# Patient Record
Sex: Female | Born: 1955 | Race: White | Hispanic: No | Marital: Single | Smoking: Current every day smoker
Health system: Southern US, Community
[De-identification: ages and names within clinical notes are randomized; demographics above are authoritative.]

## PROBLEM LIST (undated history)

## (undated) DIAGNOSIS — R0600 Dyspnea, unspecified: Secondary | ICD-10-CM

## (undated) DIAGNOSIS — T8859XA Other complications of anesthesia, initial encounter: Secondary | ICD-10-CM

## (undated) DIAGNOSIS — T7840XA Allergy, unspecified, initial encounter: Secondary | ICD-10-CM

## (undated) DIAGNOSIS — J189 Pneumonia, unspecified organism: Secondary | ICD-10-CM

## (undated) DIAGNOSIS — M1990 Unspecified osteoarthritis, unspecified site: Secondary | ICD-10-CM

## (undated) DIAGNOSIS — M797 Fibromyalgia: Secondary | ICD-10-CM

## (undated) DIAGNOSIS — I10 Essential (primary) hypertension: Secondary | ICD-10-CM

## (undated) DIAGNOSIS — H269 Unspecified cataract: Secondary | ICD-10-CM

## (undated) DIAGNOSIS — J449 Chronic obstructive pulmonary disease, unspecified: Secondary | ICD-10-CM

## (undated) DIAGNOSIS — K219 Gastro-esophageal reflux disease without esophagitis: Secondary | ICD-10-CM

## (undated) DIAGNOSIS — R519 Headache, unspecified: Secondary | ICD-10-CM

## (undated) DIAGNOSIS — Z9989 Dependence on other enabling machines and devices: Secondary | ICD-10-CM

## (undated) DIAGNOSIS — E785 Hyperlipidemia, unspecified: Secondary | ICD-10-CM

## (undated) DIAGNOSIS — J45909 Unspecified asthma, uncomplicated: Secondary | ICD-10-CM

## (undated) DIAGNOSIS — J4489 Other specified chronic obstructive pulmonary disease: Secondary | ICD-10-CM

## (undated) HISTORY — PX: SPINE SURGERY: SHX786

## (undated) HISTORY — PX: APPENDECTOMY: SHX54

## (undated) HISTORY — PX: HERNIA REPAIR: SHX51

## (undated) HISTORY — PX: ABDOMINAL HYSTERECTOMY: SHX81

## (undated) HISTORY — DX: Allergy, unspecified, initial encounter: T78.40XA

## (undated) HISTORY — PX: CYST EXCISION: SHX5701

## (undated) HISTORY — PX: TONSILLECTOMY: SUR1361

---

## 2000-07-29 ENCOUNTER — Encounter: Payer: Self-pay | Admitting: Emergency Medicine

## 2000-07-29 ENCOUNTER — Emergency Department (HOSPITAL_COMMUNITY): Admission: EM | Admit: 2000-07-29 | Discharge: 2000-07-29 | Payer: Self-pay | Admitting: Emergency Medicine

## 2001-04-15 ENCOUNTER — Encounter: Payer: Self-pay | Admitting: Emergency Medicine

## 2001-04-15 ENCOUNTER — Emergency Department (HOSPITAL_COMMUNITY): Admission: EM | Admit: 2001-04-15 | Discharge: 2001-04-15 | Payer: Self-pay | Admitting: Emergency Medicine

## 2006-08-08 ENCOUNTER — Emergency Department (HOSPITAL_COMMUNITY): Admission: EM | Admit: 2006-08-08 | Discharge: 2006-08-08 | Payer: Self-pay | Admitting: Emergency Medicine

## 2008-08-04 ENCOUNTER — Emergency Department (HOSPITAL_COMMUNITY): Admission: EM | Admit: 2008-08-04 | Discharge: 2008-08-04 | Payer: Self-pay | Admitting: Family Medicine

## 2012-10-10 ENCOUNTER — Telehealth: Payer: Self-pay

## 2013-07-02 NOTE — Telephone Encounter (Signed)
error 

## 2014-02-11 ENCOUNTER — Emergency Department (HOSPITAL_COMMUNITY): Payer: No Typology Code available for payment source

## 2014-02-11 ENCOUNTER — Encounter (HOSPITAL_COMMUNITY): Payer: Self-pay | Admitting: Emergency Medicine

## 2014-02-11 ENCOUNTER — Emergency Department (HOSPITAL_COMMUNITY)
Admission: EM | Admit: 2014-02-11 | Discharge: 2014-02-11 | Disposition: A | Discharge status: Home / Self Care | Payer: No Typology Code available for payment source | Attending: Emergency Medicine | Admitting: Emergency Medicine

## 2014-02-11 DIAGNOSIS — Z79899 Other long term (current) drug therapy: Secondary | ICD-10-CM | POA: Insufficient documentation

## 2014-02-11 DIAGNOSIS — R0602 Shortness of breath: Secondary | ICD-10-CM | POA: Insufficient documentation

## 2014-02-11 DIAGNOSIS — J45901 Unspecified asthma with (acute) exacerbation: Secondary | ICD-10-CM | POA: Insufficient documentation

## 2014-02-11 DIAGNOSIS — F17200 Nicotine dependence, unspecified, uncomplicated: Secondary | ICD-10-CM | POA: Insufficient documentation

## 2014-02-11 DIAGNOSIS — IMO0002 Reserved for concepts with insufficient information to code with codable children: Secondary | ICD-10-CM | POA: Insufficient documentation

## 2014-02-11 HISTORY — DX: Unspecified asthma, uncomplicated: J45.909

## 2014-02-11 LAB — I-STAT TROPONIN, ED: Troponin i, poc: 0 ng/mL (ref 0.00–0.08)

## 2014-02-11 MED ORDER — ALBUTEROL SULFATE HFA 108 (90 BASE) MCG/ACT IN AERS: 2.0000 | INHALATION_SPRAY | RESPIRATORY_TRACT | Status: DC | PRN

## 2014-02-11 MED ORDER — PREDNISONE 20 MG PO TABS
ORAL_TABLET | ORAL | Status: DC
Start: 2014-02-11 — End: 2017-06-19

## 2014-02-11 NOTE — ED Provider Notes (Signed)
CSN: [id number]     Arrival date & time 02/11/14  1350 History   First MD Initiated Contact with Patient 02/11/14 1444     Chief Complaint  Patient presents with  . Shortness of Breath  . Hypertension     (Consider location/radiation/quality/duration/timing/severity/associated sxs/prior Treatment) HPI 58 year old female with history of tobacco abuse, asthma, suspected COPD, presents with about one week of a cough with productive sputum, no fever, no confusion, she has constant sharp stabbing positional nonexertional chest pain 24 hours a day for the last several days worse with cough outpatient and torso movement, she has intermittent mild shortness of breath but is not short of breath now and does not want a breathing treatment now, she ran out of her inhaler so could not use of this week, she is no abdominal pain no rash no vomiting no diarrhea no other concerns. There is no treatment prior to arrival. Past Medical History  Diagnosis Date  . Asthma    Past Surgical History  Procedure Laterality Date  . Abdominal hysterectomy    . Tonsillectomy    . Appendectomy     History reviewed. No pertinent family history. History  Substance Use Topics  . Smoking status: Current Every Day Smoker -- 1.50 packs/day for 40 years    Types: Cigarettes  . Smokeless tobacco: Not on file  . Alcohol Use: No   OB History   Grav Para Term Preterm Abortions TAB SAB Ect Mult Living                 Review of Systems 10 Systems reviewed and are negative for acute change except as noted in the HPI.   Allergies  Celebrex; Ciprofloxacin; Lyrica; and Codeine  Home Medications   Prior to Admission medications   Medication Sig Start Date End Date Taking? Authorizing Provider  Ephedrine-Guaifenesin (BRONKAID) 25-400 MG TABS Take 0.5 tablets by mouth daily as needed (congestion.).   Yes Historical Provider, MD  ibuprofen (ADVIL,MOTRIN) 200 MG tablet Take 600 mg by mouth every 6 (six) hours as needed  for mild pain.   Yes Historical Provider, MD  methocarbamol (ROBAXIN) 500 MG tablet Take 250-500 mg by mouth every 8 (eight) hours as needed for muscle spasms.   Yes Historical Provider, MD  Multiple Vitamin (MULTIVITAMIN WITH MINERALS) TABS tablet Take 1 tablet by mouth daily.   Yes Historical Provider, MD  albuterol (PROVENTIL HFA;VENTOLIN HFA) 108 (90 BASE) MCG/ACT inhaler Inhale 2 puffs into the lungs every 2 (two) hours as needed for wheezing or shortness of breath (cough). 02/11/14   Babette Relic, MD  predniSONE (DELTASONE) 20 MG tablet 3 tabs po day one, then 2 po daily x 4 days 02/11/14   Babette Relic, MD   BP 171/71  Pulse 89  Temp(Src) 98.4 F (36.9 C) (Oral)  Resp 18  SpO2 96% Physical Exam  Nursing note and vitals reviewed. Constitutional:  Awake, alert, nontoxic appearance.  HENT:  Head: Atraumatic.  Eyes: Right eye exhibits no discharge. Left eye exhibits no discharge.  Neck: Neck supple.  Cardiovascular: Normal rate and regular rhythm.   No murmur heard. Pulmonary/Chest: Effort normal. No respiratory distress. She has wheezes. She has no rales. She exhibits tenderness.  Reproducible diffuse chest wall tenderness without rash, diffuse expiratory wheezes with no retractions no accessory muscle usage and pulse oximetry is normal on room air 97% with the patient speaking full sentences without distress in the ED and she does not want breathing treatment in the  ED.  Abdominal: Soft. Bowel sounds are normal. She exhibits no distension. There is no tenderness. There is no rebound and no guarding.  Musculoskeletal: She exhibits no edema and no tenderness.  Baseline ROM, no obvious new focal weakness.  Neurological: She is alert.  Mental status and motor strength appears baseline for patient and situation.  Skin: No rash noted.  Psychiatric: She has a normal mood and affect.    ED Course  Procedures (including critical care time) Patient informed of clinical course,  understand medical decision-making process, and agree with plan. Pt does not want neb in ED. Labs Review Labs Reviewed  Randolm Idol, ED    Imaging Review Dg Chest 2 View  02/11/2014   CLINICAL DATA:  Shortness of breath  EXAM: CHEST  2 VIEW  COMPARISON:  08/08/2006  FINDINGS: Cardiac shadow is stable. The lungs are well aerated bilaterally without focal infiltrate or sizable effusion. Mild degenerative changes of the thoracic spine are seen. No acute bony abnormality is noted.  IMPRESSION: No acute abnormality noted.   Electronically Signed   By: Inez Catalina M.D.   On: 02/11/2014 15:37     EKG Interpretation   Date/Time:  Wednesday February 11 2014 13:57:11 EDT Ventricular Rate:  77 PR Interval:  138 QRS Duration: 96 QT Interval:  385 QTC Calculation: 436 R Axis:   61 Text Interpretation:  Sinus rhythm Low voltage, precordial leads Baseline  wander in lead(s) I II aVF V3 V5 V6 No previous ECGs available Confirmed  by [facility]  MD, Jenny Reichmann ([id number]) on 02/11/2014 2:01:23 PM      MDM   Final diagnoses:  Asthma attack    I doubt any other EMC precluding discharge at this time including, but not necessarily limited to the following:SBI.    Babette Relic, MD 02/11/14 2231

## 2014-02-11 NOTE — ED Notes (Signed)
Pt reports hx of 40 years smoking 1.5 packs/ day. Hx of asthma. Reports productive cough, green sputum x1 week. Rib/ chest pain starting today 6/10. Pt reports she was SOB and had high blood pressure and was brought by friend to ED. Pt 98% on room air and speaking in full sentences.

## 2014-02-11 NOTE — ED Notes (Signed)
Initial Contact - pt to RM5, placed to cardiac/02 monitor.  Reports cough x1 week, prod yellow/green sputum and feeling SOB since last night "i think it's bronchitis".  Pt denies CP.  Reports c/o 6/10 pain to ribs "from coughing so much".  Pt denies fevers/chills.  Skin PWD.  Speaking full/clear sentences, rr even/un-lab.  MAEI, self repositioning for comfort.  NAD.

## 2014-02-11 NOTE — ED Notes (Signed)
Pt to CXR.

## 2014-02-11 NOTE — Discharge Instructions (Signed)
Asthma, Acute Bronchospasm °Acute bronchospasm caused by asthma is also referred to as an asthma attack. Bronchospasm means your air passages become narrowed. The narrowing is caused by inflammation and tightening of the muscles in the air tubes (bronchi) in your lungs. This can make it hard to breathe or cause you to wheeze and cough. °CAUSES °Possible triggers are: °· Animal dander from the skin, hair, or feathers of animals. °· Dust mites contained in house dust. °· Cockroaches. °· Pollen from trees or grass. °· Mold. °· Cigarette or tobacco smoke. °· Air pollutants such as dust, household cleaners, hair sprays, aerosol sprays, paint fumes, strong chemicals, or strong odors. °· Cold air or weather changes. Cold air may trigger inflammation. Winds increase molds and pollens in the air. °· Strong emotions such as crying or laughing hard. °· Stress. °· Certain medicines such as aspirin or beta-blockers. °· Sulfites in foods and drinks, such as dried fruits and wine. °· Infections or inflammatory conditions, such as a flu, cold, or inflammation of the nasal membranes (rhinitis). °· Gastroesophageal reflux disease (GERD). GERD is a condition where stomach acid backs up into your esophagus. °· Exercise or strenuous activity. °SIGNS AND SYMPTOMS  °· Wheezing. °· Excessive coughing, particularly at night. °· Chest tightness. °· Shortness of breath. °DIAGNOSIS  °Your health care provider will ask you about your medical history and perform a physical exam. A chest X-ray or blood testing may be performed to look for other causes of your symptoms or other conditions that may have triggered your asthma attack.  °TREATMENT  °Treatment is aimed at reducing inflammation and opening up the airways in your lungs.  Most asthma attacks are treated with inhaled medicines. These include quick relief or rescue medicines (such as bronchodilators) and controller medicines (such as inhaled corticosteroids). These medicines are sometimes  given through an inhaler or a nebulizer. Systemic steroid medicine taken by mouth or given through an IV tube also can be used to reduce the inflammation when an attack is moderate or severe. Antibiotic medicines are only used if a bacterial infection is present.  °HOME CARE INSTRUCTIONS  °· Rest. °· Drink plenty of liquids. This helps the mucus to remain thin and be easily coughed up. Only use caffeine in moderation and do not use alcohol until you have recovered from your illness. °· Do not smoke. Avoid being exposed to secondhand smoke. °· You play a critical role in keeping yourself in good health. Avoid exposure to things that cause you to wheeze or to have breathing problems. °· Keep your medicines up-to-date and available. Carefully follow your health care provider's treatment plan. °· Take your medicine exactly as prescribed. °· When pollen or pollution is bad, keep windows closed and use an air conditioner or go to places with air conditioning. °· Asthma requires careful medical care. See your health care provider for a follow-up as advised. If you are more than [redacted] weeks pregnant and you were prescribed any new medicines, let your obstetrician know about the visit and how you are doing. Follow up with your health care provider as directed. °· After you have recovered from your asthma attack, make an appointment with your outpatient doctor to talk about ways to reduce the likelihood of future attacks. If you do not have a doctor who manages your asthma, make an appointment with a primary care doctor to discuss your asthma. °SEEK IMMEDIATE MEDICAL CARE IF:  °· You are getting worse. °· You have trouble breathing. If severe, call your local   emergency services (911 in the [place]  You develop chest pain or discomfort.  You are vomiting.  You are not able to keep fluids down.  You are coughing up yellow, green, brown, or bloody sputum.  You have a fever and your symptoms suddenly get worse.  You have  trouble swallowing. MAKE SURE YOU:   Understand these instructions.  Will watch your condition.  Will get help right away if you are not doing well or get worse. Document Released: 10/18/2006 Document Revised: 07/08/2013 Document Reviewed: 01/08/2013 [facility] Patient Information 2015 [place], [place]. This information is not intended to replace advice given to you by your health care provider. Make sure you discuss any questions you have with your health care provider.  You appear to have an upper respiratory infection (URI). An upper respiratory tract infection, or cold, is a viral infection of the air passages leading to the lungs. It is contagious and can be spread to others, especially during the first 3 or 4 days. It cannot be cured by antibiotics or other medicines. RETURN IMMEDIATELY IF you develop shortness of breath, confusion or altered mental status, a new rash, become dizzy, faint, or poorly responsive, or are unable to be cared for at home.  Please stop smoking to improve your health.

## 2014-02-11 NOTE — Progress Notes (Signed)
  CARE MANAGEMENT ED NOTE 02/11/2014  Patient:  Frances Nelson, Frances Nelson   Account Number:  [id number]  Date Initiated:  02/11/2014  Documentation initiated by:  Livia Snellen  Subjective/Objective Assessment:   Patient presents to Ed with shortness of breath, high blood pressure, productive cough for green sputum     Subjective/Objective Assessment Detail:     Action/Plan:   Action/Plan Detail:   Anticipated DC Date:       Status Recommendation to Physician:   Result of Recommendation:    Other ED [place]  Other  PCP issues    Choice offered to / List presented to:            Status of service:  Completed, signed off  ED Comments:   ED Comments Detail:  Patient confirms she does not have a pcp with [place] insurance.  EDCM instructed patient to call the phone number on the back of her insutrance card or go to [place] website to help her find a pcp who is close to her and within network.  Patient verbalized understanding.  No further EDCM needs at this time.

## 2014-07-17 DIAGNOSIS — J3089 Other allergic rhinitis: Secondary | ICD-10-CM | POA: Insufficient documentation

## 2015-09-06 DIAGNOSIS — J441 Chronic obstructive pulmonary disease with (acute) exacerbation: Secondary | ICD-10-CM | POA: Insufficient documentation

## 2015-10-28 DIAGNOSIS — M792 Neuralgia and neuritis, unspecified: Secondary | ICD-10-CM | POA: Diagnosis not present

## 2015-10-28 DIAGNOSIS — R768 Other specified abnormal immunological findings in serum: Secondary | ICD-10-CM | POA: Diagnosis not present

## 2015-10-28 DIAGNOSIS — M791 Myalgia: Secondary | ICD-10-CM | POA: Diagnosis not present

## 2015-10-28 DIAGNOSIS — T50905A Adverse effect of unspecified drugs, medicaments and biological substances, initial encounter: Secondary | ICD-10-CM | POA: Insufficient documentation

## 2015-10-28 DIAGNOSIS — J449 Chronic obstructive pulmonary disease, unspecified: Secondary | ICD-10-CM | POA: Diagnosis not present

## 2015-10-28 DIAGNOSIS — M62838 Other muscle spasm: Secondary | ICD-10-CM | POA: Insufficient documentation

## 2015-11-11 DIAGNOSIS — M79604 Pain in right leg: Secondary | ICD-10-CM | POA: Diagnosis not present

## 2015-11-11 DIAGNOSIS — M79601 Pain in right arm: Secondary | ICD-10-CM | POA: Insufficient documentation

## 2015-11-26 DIAGNOSIS — M792 Neuralgia and neuritis, unspecified: Secondary | ICD-10-CM | POA: Diagnosis not present

## 2015-11-26 DIAGNOSIS — M791 Myalgia: Secondary | ICD-10-CM | POA: Diagnosis not present

## 2015-11-26 DIAGNOSIS — N281 Cyst of kidney, acquired: Secondary | ICD-10-CM | POA: Diagnosis not present

## 2015-12-08 DIAGNOSIS — M792 Neuralgia and neuritis, unspecified: Secondary | ICD-10-CM | POA: Diagnosis not present

## 2015-12-08 DIAGNOSIS — M791 Myalgia: Secondary | ICD-10-CM | POA: Diagnosis not present

## 2015-12-08 DIAGNOSIS — G5601 Carpal tunnel syndrome, right upper limb: Secondary | ICD-10-CM | POA: Diagnosis not present

## 2015-12-16 DIAGNOSIS — J449 Chronic obstructive pulmonary disease, unspecified: Secondary | ICD-10-CM | POA: Diagnosis not present

## 2015-12-16 DIAGNOSIS — G5601 Carpal tunnel syndrome, right upper limb: Secondary | ICD-10-CM | POA: Insufficient documentation

## 2015-12-16 DIAGNOSIS — R768 Other specified abnormal immunological findings in serum: Secondary | ICD-10-CM | POA: Diagnosis not present

## 2015-12-24 DIAGNOSIS — M79671 Pain in right foot: Secondary | ICD-10-CM | POA: Diagnosis not present

## 2015-12-24 DIAGNOSIS — M792 Neuralgia and neuritis, unspecified: Secondary | ICD-10-CM | POA: Diagnosis not present

## 2015-12-24 DIAGNOSIS — R609 Edema, unspecified: Secondary | ICD-10-CM | POA: Insufficient documentation

## 2015-12-24 DIAGNOSIS — G894 Chronic pain syndrome: Secondary | ICD-10-CM | POA: Diagnosis not present

## 2016-07-04 DIAGNOSIS — J441 Chronic obstructive pulmonary disease with (acute) exacerbation: Secondary | ICD-10-CM | POA: Diagnosis not present

## 2017-05-30 DIAGNOSIS — M545 Low back pain: Secondary | ICD-10-CM | POA: Diagnosis not present

## 2017-05-30 DIAGNOSIS — J45901 Unspecified asthma with (acute) exacerbation: Secondary | ICD-10-CM | POA: Diagnosis not present

## 2017-05-30 DIAGNOSIS — Z79899 Other long term (current) drug therapy: Secondary | ICD-10-CM | POA: Diagnosis not present

## 2017-05-30 DIAGNOSIS — G8929 Other chronic pain: Secondary | ICD-10-CM | POA: Diagnosis not present

## 2017-06-06 ENCOUNTER — Ambulatory Visit
Admission: RE | Admit: 2017-06-06 | Discharge: 2017-06-06 | Disposition: A | Discharge status: Home / Self Care | Payer: BLUE CROSS/BLUE SHIELD | Source: Ambulatory Visit | Attending: Internal Medicine | Admitting: Internal Medicine

## 2017-06-06 ENCOUNTER — Other Ambulatory Visit: Payer: Self-pay | Admitting: Internal Medicine

## 2017-06-06 DIAGNOSIS — J4540 Moderate persistent asthma, uncomplicated: Secondary | ICD-10-CM

## 2017-06-06 DIAGNOSIS — Z23 Encounter for immunization: Secondary | ICD-10-CM | POA: Diagnosis not present

## 2017-06-06 DIAGNOSIS — R05 Cough: Secondary | ICD-10-CM | POA: Diagnosis not present

## 2017-06-19 ENCOUNTER — Encounter: Payer: Self-pay | Admitting: Emergency Medicine

## 2017-06-19 ENCOUNTER — Ambulatory Visit: Payer: BLUE CROSS/BLUE SHIELD | Admitting: Emergency Medicine

## 2017-06-19 DIAGNOSIS — J449 Chronic obstructive pulmonary disease, unspecified: Secondary | ICD-10-CM

## 2017-06-19 DIAGNOSIS — Z720 Tobacco use: Secondary | ICD-10-CM | POA: Diagnosis not present

## 2017-06-19 DIAGNOSIS — J301 Allergic rhinitis due to pollen: Secondary | ICD-10-CM

## 2017-06-19 DIAGNOSIS — J309 Allergic rhinitis, unspecified: Secondary | ICD-10-CM | POA: Insufficient documentation

## 2017-06-19 NOTE — Assessment & Plan Note (Signed)
Stop zyrtec. Try starting loratadine 10mg  daily (not the Claritin + D) Please tray using fluticasone nasal spray, 2 sprays each nostril once a day.  Congratulations on your decision to work on stopping smoking.  Agree that Chantix may be able to help you.  Quit date is set for 07/17/17. We will perform pulmonary function testing at your next office visit Continue to keep albuterol available to use 2 puffs if needed for shortness of breath, wheezing, chest tightness Ok to [place] Bronkaid as needed for now.  Follow with Dr Lamonte Sakai next available with full PFT

## 2017-06-19 NOTE — Assessment & Plan Note (Signed)
Large contributor and exacerbate her of her asthmatic symptoms.  She has documented sensitivities with skin testing.  Suspect that she will ultimately require immunotherapy but right now we can add nasal steroid to her antihistamine.  She wonders if Zyrtec no longer works for her and wants to change to an alternative.  We will try loratadine.

## 2017-06-19 NOTE — Patient Instructions (Addendum)
Stop zyrtec. Try starting loratadine 10mg  daily (not the Claritin + D) Please tray using fluticasone nasal spray, 2 sprays each nostril once a day.  Congratulations on your decision to work on stopping smoking.  Agree that Chantix may be able to help you.  Quit date is set for 07/17/17. We will perform pulmonary function testing at your next office visit Continue to keep albuterol available to use 2 puffs if needed for shortness of breath, wheezing, chest tightness Ok to [place] Bronkaid as needed for now.  Follow with Dr Lamonte Sakai next available with full PFT

## 2017-06-19 NOTE — Progress Notes (Signed)
Subjective:    Patient ID: Frances Nelson, female    DOB: Oct 22, 1955, 61 y.o.   MRN: [id number]  HPI 60 year old active smoker (60 pack years) with a history of childhood asthma, allergic rhinitis, positive ANA and anti-RNP, has been seen in the past by Dr. Camillo Flaming for asthma. She began to have recurrent cough, wheeze, dyspnea in her mid-20's. Has been on and off scheduled inhaled meds, most recently Breo (has been on Advair, Symbicort before) but stopped due to mouth sores, throat irritation. She takes her ventolin or albuterol nebs 2-3x a day right now, usually about once a day. Her most significant trigger is allergic rhinitis, she has multiple allergies documented skin testing, never on immunotherapy. Other triggers include cats, perfumes, smoke. Last spiro was 2016. Hasn't started chantix yet but set a date for 07/17/17.   Review of Systems  Constitutional: Negative for fever and unexpected weight change.  HENT: Positive for congestion, dental problem, ear pain, sneezing and sore throat. Negative for nosebleeds, postnasal drip, rhinorrhea, sinus pressure and trouble swallowing.   Eyes: Negative for redness and itching.  Respiratory: Positive for cough and shortness of breath. Negative for chest tightness and wheezing.   Cardiovascular: Negative for palpitations and leg swelling.  Gastrointestinal: Negative for nausea and vomiting.  Genitourinary: Negative for dysuria.  Musculoskeletal: Negative for joint swelling.  Skin: Negative for rash.  Neurological: Positive for headaches.  Hematological: Does not bruise/bleed easily.  Psychiatric/Behavioral: Negative for dysphoric mood. The patient is not nervous/anxious.    Past Medical History:  Diagnosis Date  . Asthma      No family history on file.   Social History   Socioeconomic History  . Marital status: Divorced    Spouse name: Not on file  . Number of children: Not on file  . Years of education: Not on file  . Highest education  level: Not on file  Social Needs  . Financial resource strain: Not on file  . Food insecurity - worry: Not on file  . Food insecurity - inability: Not on file  . Transportation needs - medical: Not on file  . Transportation needs - non-medical: Not on file  Occupational History  . Not on file  Tobacco Use  . Smoking status: Current Every Day Smoker    Packs/day: 0.75    Years: 40.00    Pack years: 30.00    Types: Cigarettes  . Smokeless tobacco: Never Used  Substance and Sexual Activity  . Alcohol use: No  . Drug use: Not on file  . Sexual activity: Not on file  Other Topics Concern  . Not on file  Social History Narrative  . 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  No [employer]   Allergies  Allergen Reactions  . Celebrex [Celecoxib]     Bleeding.    . Chocolate Flavor   . Ciprofloxacin Nausea And Vomiting    Headache, shaking.   . Eggs Or Egg-Derived Products   . Gabapentin   . Lyrica [Pregabalin]     "Made me out of it."   . Tramadol   . Wellbutrin [Bupropion]   . Codeine Palpitations     Outpatient Medications Prior to Visit  Medication Sig Dispense Refill  . albuterol (PROVENTIL HFA;VENTOLIN HFA) 108 (90 BASE) MCG/ACT inhaler Inhale 2 puffs into the lungs every 2 (two) hours as needed for wheezing or shortness of breath (cough). 1 Inhaler 0  . cetirizine (ZYRTEC)  10 MG tablet Take 10 mg by mouth daily.    . cholecalciferol (VITAMIN D) 1000 units tablet Take 2,000 Units by mouth daily.    Marland Kitchen Ephedrine-Guaifenesin (BRONKAID) 25-400 MG TABS Take 0.5 tablets by mouth daily as needed (congestion.).    Marland Kitchen Melatonin 5 MG TABS Take 1 tablet by mouth at bedtime.    . methocarbamol (ROBAXIN) 500 MG tablet Take 250-500 mg by mouth every 8 (eight) hours as needed for muscle spasms.    . Multiple Vitamin (MULTIVITAMIN WITH MINERALS) TABS tablet Take 1 tablet by mouth daily.    Marland Kitchen omeprazole (PRILOSEC) 40 MG capsule Take 40 mg by mouth daily.    .  ranitidine (ZANTAC) 150 MG tablet Take 150 mg by mouth daily.    . tizanidine (ZANAFLEX) 6 MG capsule Take 6 mg by mouth at bedtime.    Marland Kitchen ibuprofen (ADVIL,MOTRIN) 200 MG tablet Take 600 mg by mouth every 6 (six) hours as needed for mild pain.    . predniSONE (DELTASONE) 20 MG tablet 3 tabs po day one, then 2 po daily x 4 days 11 tablet 0   No facility-administered medications prior to visit.         Objective:   Physical Exam Vitals:   06/19/17 1139 06/19/17 1141  BP:  138/88  Pulse:  80  SpO2:  98%  Weight: 198 lb (89.8 kg)   Height: 5' 2.75" (1.594 m)    Gen: Pleasant, overweight woman, in no distress,  normal affect  ENT: No lesions,  mouth clear,  oropharynx clear, no postnasal drip  Neck: No JVD, no Ryder  Lungs: No use of accessory muscles, distant, clear without rales or rhonchi  Cardiovascular: RRR, heart sounds normal, no murmur or gallops, no peripheral edema  Musculoskeletal: No deformities, no cyanosis or clubbing  Neuro: alert, non focal  Skin: Warm, no lesions or rash      Assessment & Plan:  COPD with asthma ([place]) Stop zyrtec. Try starting loratadine 10mg  daily (not the Claritin + D) Please tray using fluticasone nasal spray, 2 sprays each nostril once a day.  Congratulations on your decision to work on stopping smoking.  Agree that Chantix may be able to help you.  Quit date is set for 07/17/17. We will perform pulmonary function testing at your next office visit Continue to keep albuterol available to use 2 puffs if needed for shortness of breath, wheezing, chest tightness Ok to [place] Bronkaid as needed for now.  Follow with Dr Lamonte Sakai next available with full PFT  Tobacco abuse Congratulations on your decision to work on stopping smoking.  Agree that Chantix may be able to help you.  Quit date is set for 07/17/17.  Allergic rhinitis Large contributor and exacerbate her of her asthmatic symptoms.  She has documented sensitivities with skin testing.  Suspect  that she will ultimately require immunotherapy but right now we can add nasal steroid to her antihistamine.  She wonders if Zyrtec no longer works for her and wants to change to an alternative.  We will try loratadine.  Baltazar Apo, MD, PhD 06/19/2017, 1:47 PM [facility] [phone] or if no answer [phone]

## 2017-06-19 NOTE — Assessment & Plan Note (Signed)
Congratulations on your decision to work on stopping smoking.  Agree that Chantix may be able to help you.  Quit date is set for 07/17/17.

## 2017-08-06 ENCOUNTER — Other Ambulatory Visit: Payer: Self-pay | Admitting: Emergency Medicine

## 2017-08-06 DIAGNOSIS — J449 Chronic obstructive pulmonary disease, unspecified: Secondary | ICD-10-CM

## 2017-08-07 ENCOUNTER — Ambulatory Visit (INDEPENDENT_AMBULATORY_CARE_PROVIDER_SITE_OTHER): Payer: BLUE CROSS/BLUE SHIELD | Admitting: Emergency Medicine

## 2017-08-07 ENCOUNTER — Ambulatory Visit: Payer: BLUE CROSS/BLUE SHIELD | Admitting: Emergency Medicine

## 2017-08-07 ENCOUNTER — Encounter: Payer: Self-pay | Admitting: Emergency Medicine

## 2017-08-07 VITALS — BP 122/78 | HR 92 | Ht 63.0 in | Wt 202.0 lb

## 2017-08-07 DIAGNOSIS — J4489 Other specified chronic obstructive pulmonary disease: Secondary | ICD-10-CM

## 2017-08-07 DIAGNOSIS — Z720 Tobacco use: Secondary | ICD-10-CM | POA: Diagnosis not present

## 2017-08-07 DIAGNOSIS — J449 Chronic obstructive pulmonary disease, unspecified: Secondary | ICD-10-CM

## 2017-08-07 DIAGNOSIS — J301 Allergic rhinitis due to pollen: Secondary | ICD-10-CM

## 2017-08-07 LAB — PULMONARY FUNCTION TEST
DL/VA % pred: 110 %
DL/VA: 5.18 ml/min/mmHg/L
DLCO cor % pred: 100 %
DLCO cor: 22.94 ml/min/mmHg
DLCO unc % pred: 103 %
DLCO unc: 23.74 ml/min/mmHg
FEF 25-75 Post: 3.46 L/sec
FEF 25-75 Pre: 1.5 L/sec
FEF2575-%Change-Post: 130 %
FEF2575-%Pred-Post: 155 %
FEF2575-%Pred-Pre: 67 %
FEV1-%Change-Post: 29 %
FEV1-%Pred-Post: 83 %
FEV1-%Pred-Pre: 64 %
FEV1-Post: 2.03 L
FEV1-Pre: 1.57 L
FEV1FVC-%Change-Post: -2 %
FEV1FVC-%Pred-Pre: 103 %
FEV6-%Change-Post: 32 %
FEV6-%Pred-Post: 85 %
FEV6-%Pred-Pre: 64 %
FEV6-Post: 2.57 L
FEV6-Pre: 1.94 L
FEV6FVC-%Pred-Post: 104 %
FEV6FVC-%Pred-Pre: 104 %
FVC-%Change-Post: 32 %
FVC-%Pred-Post: 81 %
FVC-%Pred-Pre: 61 %
FVC-Post: 2.57 L
FVC-Pre: 1.94 L
Post FEV1/FVC ratio: 79 %
Post FEV6/FVC ratio: 100 %
Pre FEV1/FVC ratio: 81 %
Pre FEV6/FVC Ratio: 100 %
RV % pred: 133 %
RV: 2.63 L
TLC % pred: 104 %
TLC: 5.13 L

## 2017-08-07 MED ORDER — ARFORMOTEROL TARTRATE 15 MCG/2ML IN NEBU: 15.0000 ug | INHALATION_SOLUTION | Freq: Two times a day (BID) | RESPIRATORY_TRACT | 5 refills | Status: DC

## 2017-08-07 MED ORDER — BUDESONIDE 0.5 MG/2ML IN SUSP: 0.5000 mg | Freq: Two times a day (BID) | RESPIRATORY_TRACT | 5 refills | Status: DC

## 2017-08-07 NOTE — Progress Notes (Signed)
Subjective:    Patient ID: Frances Nelson, female    DOB: 1956/07/17, 62 y.o.   MRN: [id number]  COPD  She complains of cough and shortness of breath. There is no wheezing. Associated symptoms include ear pain, headaches, sneezing and a sore throat. Pertinent negatives include no fever, postnasal drip, rhinorrhea or trouble swallowing. Her past medical history is significant for COPD.   62 year old active smoker (60 pack years) with a history of childhood asthma, allergic rhinitis, positive ANA and anti-RNP, has been seen in the past by Dr. Camillo Flaming for asthma. She began to have recurrent cough, wheeze, dyspnea in her mid-62's. Has been on and off scheduled inhaled meds, most recently Breo (has been on Advair, Symbicort before) but stopped due to mouth sores, throat irritation. She takes her ventolin or albuterol nebs 2-3x a day right now, usually about once a day. Her most significant trigger is allergic rhinitis, she has multiple allergies documented skin testing, never on immunotherapy. Other triggers include cats, perfumes, smoke. Last spiro was 2016. Hasn't started chantix yet but set a date for 07/17/17.   ROV 08/07/17 --follow-up visit for dyspnea.  She has a history of tobacco use, childhood asthma, chronic cough, allergic rhinitis.  We started fluticasone nasal spray, change her Zyrtec to loratadine. She is having more congestion, sneezing now. She stopped the flonase due to burning sensation.   She had pulmonary function testing done today that I have reviewed.  This shows mixed obstruction and restriction on spirometry, obstruction confirmed by a positive bronchodilator response.  Her lung volumes are grossly normal as is her diffusion capacity. She continues to cough. She is working to decrease her cigarettes, about 5-10 a day. She hasn't had any Bronkaid in a month. She has been on several scheduled ICS / LABA before, not currently.                                        Review of Systems    Constitutional: Negative for fever and unexpected weight change.  HENT: Positive for congestion, dental problem, ear pain, sneezing and sore throat. Negative for nosebleeds, postnasal drip, rhinorrhea, sinus pressure and trouble swallowing.   Eyes: Negative for redness and itching.  Respiratory: Positive for cough and shortness of breath. Negative for chest tightness and wheezing.   Cardiovascular: Negative for palpitations and leg swelling.  Gastrointestinal: Negative for nausea and vomiting.  Genitourinary: Negative for dysuria.  Musculoskeletal: Negative for joint swelling.  Skin: Negative for rash.  Neurological: Positive for headaches.  Hematological: Does not bruise/bleed easily.  Psychiatric/Behavioral: Negative for dysphoric mood. The patient is not nervous/anxious.    Past Medical History:  Diagnosis Date  . Asthma      No family history on file.   Social History   Socioeconomic History  . Marital status: Divorced    Spouse name: Not on file  . Number of children: Not on file  . Years of education: Not on file  . Highest education level: Not on file  Social Needs  . Financial resource strain: Not on file  . Food insecurity - worry: Not on file  . Food insecurity - inability: Not on file  . Transportation needs - medical: Not on file  . Transportation needs - non-medical: Not on file  Occupational History  . Not on file  Tobacco Use  . Smoking status: Current Every Day Smoker  Packs/day: 0.75    Years: 40.00    Pack years: 30.00    Types: Cigarettes  . Smokeless tobacco: Never Used  Substance and Sexual Activity  . Alcohol use: No  . Drug use: Not on file  . Sexual activity: Not on file  Other Topics Concern  . Not on file  Social History Narrative  . 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  No [employer]   Allergies  Allergen Reactions  . Celebrex [Celecoxib]     Bleeding.    . Chocolate Flavor   . Ciprofloxacin  Nausea And Vomiting    Headache, shaking.   . Eggs Or Egg-Derived Products   . Gabapentin   . Lyrica [Pregabalin]     "Made me out of it."   . Tramadol   . Wellbutrin [Bupropion]   . Codeine Palpitations     Outpatient Medications Prior to Visit  Medication Sig Dispense Refill  . albuterol (PROVENTIL HFA;VENTOLIN HFA) 108 (90 BASE) MCG/ACT inhaler Inhale 2 puffs into the lungs every 2 (two) hours as needed for wheezing or shortness of breath (cough). 1 Inhaler 0  . loratadine (CLARITIN) 10 MG tablet Take 10 mg by mouth daily.    . Multiple Vitamin (MULTIVITAMIN WITH MINERALS) TABS tablet Take 1 tablet by mouth daily.    Marland Kitchen omeprazole (PRILOSEC) 40 MG capsule Take 40 mg by mouth daily.    . ranitidine (ZANTAC) 150 MG tablet Take 150 mg by mouth daily.    . tizanidine (ZANAFLEX) 6 MG capsule Take 6 mg by mouth at bedtime.    . varenicline (CHANTIX) 1 MG tablet Take 1 mg by mouth 2 (two) times daily.    . cetirizine (ZYRTEC) 10 MG tablet Take 10 mg by mouth daily.    . cholecalciferol (VITAMIN D) 1000 units tablet Take 2,000 Units by mouth daily.    Marland Kitchen Ephedrine-Guaifenesin (BRONKAID) 25-400 MG TABS Take 0.5 tablets by mouth daily as needed (congestion.).    Marland Kitchen Melatonin 5 MG TABS Take 1 tablet by mouth at bedtime.    . methocarbamol (ROBAXIN) 500 MG tablet Take 250-500 mg by mouth every 8 (eight) hours as needed for muscle spasms.     No facility-administered medications prior to visit.         Objective:   Physical Exam Vitals:   08/07/17 1603 08/07/17 1607  BP:  122/78  Pulse:  92  SpO2:  97%  Weight: 202 lb (91.6 kg)   Height: 5\' 3"  (1.6 m)    Gen: Pleasant, overweight woman, in no distress,  normal affect  ENT: No lesions,  mouth clear,  oropharynx clear, no postnasal drip  Neck: No JVD, no Ryder  Lungs: No use of accessory muscles, distant, clear without rales or rhonchi  Cardiovascular: RRR, heart sounds normal, no murmur or gallops, no peripheral  edema  Musculoskeletal: No deformities, no cyanosis or clubbing  Neuro: alert, non focal  Skin: Warm, no lesions or rash      Assessment & Plan:  COPD with asthma ([place]) Confirmed on spirometry today.  Significant bronchodilator response.  We will try starting nebulized therapy since she has had a difficult time tolerating inhalers due to cough and upper airway irritation.  Start Brovana, budesonide.  She will need a nebulizer machine   Tobacco abuse Discussed techniques for smoking cessation today.  She is using Chantix  Allergic rhinitis Seemed to do better on Zyrtec.  We will  stop Claritin and switch back to Zyrtec now. Stop Bronkaid  Baltazar Apo, MD, PhD 08/07/2017, 4:52 PM [facility] [phone] or if no answer [phone]

## 2017-08-07 NOTE — Assessment & Plan Note (Signed)
Seemed to do better on Zyrtec.  We will stop Claritin and switch back to Zyrtec now. Stop Bronkaid

## 2017-08-07 NOTE — Progress Notes (Signed)
PFT completed today 08/07/17

## 2017-08-07 NOTE — Assessment & Plan Note (Signed)
Discussed techniques for smoking cessation today.  She is using Chantix

## 2017-08-07 NOTE — Addendum Note (Signed)
Addended by: Desmond Dike C on: 08/07/2017 05:01 PM   Modules accepted: Orders

## 2017-08-07 NOTE — Patient Instructions (Addendum)
We will restart zyrtec, stop Claritin.  Please start brovana and budesonide nebulized twice a day.  Remember to rinse and gargle after taking these medications Keep your albuterol nebulizer available to use if needed for shortness of breath, chest tightness, wheezing, spells of coughing Continue to work hard on decreasing your smoking Follow with Dr Lamonte Sakai in 1-2 months to review your progress on the medication.

## 2017-08-07 NOTE — Assessment & Plan Note (Signed)
Confirmed on spirometry today.  Significant bronchodilator response.  We will try starting nebulized therapy since she has had a difficult time tolerating inhalers due to cough and upper airway irritation.  Start Brovana, budesonide.  She will need a nebulizer machine

## 2017-08-09 ENCOUNTER — Telehealth: Payer: Self-pay | Admitting: Emergency Medicine

## 2017-08-09 DIAGNOSIS — J449 Chronic obstructive pulmonary disease, unspecified: Secondary | ICD-10-CM

## 2017-08-09 NOTE — Telephone Encounter (Signed)
Called and spoke with pt and she stated that she went to pick up her meds from the pharmacy and she only got the budesonide and this was $80 after the insurance.  She stated that she cannot afford the other medication and wanted to see if she would be able to use just this medication for the rest of the month and then change back to her other meds.  RB please advise. Thanks

## 2017-08-09 NOTE — Telephone Encounter (Signed)
Doubt that budesonide alone is going to be effective.  We could try Perforomist to see if this is more affordable than the Brovana.  If this combination is not affordable then we will have to consider changing back to 1 of the other ICS / LABA's (that she has had trouble tolerating in the past).

## 2017-08-10 MED ORDER — FORMOTEROL FUMARATE 20 MCG/2ML IN NEBU: 20.0000 ug | INHALATION_SOLUTION | Freq: Two times a day (BID) | RESPIRATORY_TRACT | 0 refills | Status: DC

## 2017-08-10 NOTE — Telephone Encounter (Signed)
Spoke with pt and advised recommendations from [facility]. She states she did get the Pulmicort but it was 80 dollars and she does not plan to purchase this again because she cannot afford 80 dollars per month going forward. I sent in the Performist into the pharmacy to see if this medication has better coverage. She will CB with update from the pharmacy. We may have to go back to the other alternatives RB suggested that pt used in the past.

## 2017-08-10 NOTE — Telephone Encounter (Signed)
lmtcb X1 for pt to make aware of recs.   

## 2017-08-14 NOTE — Telephone Encounter (Signed)
Spoke with [facility] and Perfomist is not covered. RB please advise. We may have to switch her back to the medicines she used in there past.

## 2017-08-14 NOTE — Telephone Encounter (Signed)
Patient returned call, CB is [phone]

## 2017-08-14 NOTE — Telephone Encounter (Signed)
Pt is returning call. Cb is [phone].

## 2017-08-14 NOTE — Telephone Encounter (Signed)
Called and spoke with patient regarding nebulizer solution medication  Pt states she is wanting Pulmicort and Brovana neb solution, but too expensive and insurance will not cover it Pt advised in need of Chantix refill, starter pack is almost completed [employer] insurance at phone [phone] for customer service, was on hold 17 minutes Tried to call [employer] insurance at phone for pharmacist help desk at [phone], hold 16 minutes LVM for s/o with [facility] to return call, needing to know why insurance not coving medication  LVM for [facility]ist with [facility] at phone [phone] to return call back Need to know why not covering the medications and if PA is needed for both neb meds  Will follow up tomorrow morning on both messages

## 2017-08-14 NOTE — Telephone Encounter (Signed)
ATC pt, no answer. Left message for pt to call back.  

## 2017-08-14 NOTE — Telephone Encounter (Signed)
Based on the cost I would stop the nebulized medication.  Instead I think she should try going on Symbicort 160/4.5 mcg, 2 puffs twice a day through a spacer.  She needs to remember to rinse and gargle after using.

## 2017-08-14 NOTE — Telephone Encounter (Signed)
LVM for patient to return call regarding RB response to message

## 2017-08-20 NOTE — Telephone Encounter (Signed)
lmtcb x1 for pt. We need to know if she is okay with taking Symbicort as RB suggested.

## 2017-08-21 DIAGNOSIS — J449 Chronic obstructive pulmonary disease, unspecified: Secondary | ICD-10-CM | POA: Diagnosis not present

## 2017-08-21 MED ORDER — BUDESONIDE-FORMOTEROL FUMARATE 160-4.5 MCG/ACT IN AERO: 2.0000 | INHALATION_SPRAY | Freq: Two times a day (BID) | RESPIRATORY_TRACT | 5 refills | Status: DC

## 2017-08-21 NOTE — Telephone Encounter (Signed)
Spoke with pt. She is okay with switching to Symbicort. Rx has been sent in. She is also needing new nebulizer tubing kits. Order has been placed for these. Nothing further was needed.

## 2017-09-03 ENCOUNTER — Telehealth: Payer: Self-pay | Admitting: Emergency Medicine

## 2017-09-03 MED ORDER — AZITHROMYCIN 250 MG PO TABS: ORAL_TABLET | ORAL | 0 refills | Status: DC

## 2017-09-03 NOTE — Telephone Encounter (Signed)
Agree with stopping the symbicort.  Ask her to take azithro z-pack

## 2017-09-03 NOTE — Telephone Encounter (Signed)
Spoke with patient. She is aware of RB's recs. Will add Symbicort to her allergy list, patient is aware. Zpak will be called into her pharmacy for her.   Nothing else needed at time of call.

## 2017-09-03 NOTE — Telephone Encounter (Signed)
Spoke with patient. She stated that she started the Symbicort 160 on 2/7. Even since then, she has been feeling ill. She has experienced increased wheezing, coughing, sore throat, dizziness when going from a sitting position to a standing position, nausea and headaches.   She used Symbicort before in the past and remembered that it did the same thing to her back then.   She feels that she has now developed bronchitis and would like to have an abx called in for her.   She advised that she will stop taking the Symbicort and go back to her treatment prior to seeing RB.   [facility] [address], please advise. Thanks!

## 2017-09-11 ENCOUNTER — Ambulatory Visit: Payer: BLUE CROSS/BLUE SHIELD | Admitting: Emergency Medicine

## 2017-10-15 ENCOUNTER — Ambulatory Visit: Payer: BLUE CROSS/BLUE SHIELD | Admitting: Emergency Medicine

## 2017-10-23 ENCOUNTER — Ambulatory Visit: Payer: BLUE CROSS/BLUE SHIELD | Admitting: Emergency Medicine

## 2017-10-23 ENCOUNTER — Encounter: Payer: Self-pay | Admitting: Emergency Medicine

## 2017-10-23 DIAGNOSIS — J449 Chronic obstructive pulmonary disease, unspecified: Secondary | ICD-10-CM | POA: Diagnosis not present

## 2017-10-23 DIAGNOSIS — Z720 Tobacco use: Secondary | ICD-10-CM | POA: Diagnosis not present

## 2017-10-23 DIAGNOSIS — J301 Allergic rhinitis due to pollen: Secondary | ICD-10-CM | POA: Diagnosis not present

## 2017-10-23 MED ORDER — VARENICLINE TARTRATE 1 MG PO TABS: 1.0000 mg | ORAL_TABLET | Freq: Two times a day (BID) | ORAL | 2 refills | Status: DC

## 2017-10-23 NOTE — Assessment & Plan Note (Signed)
  Continue your zyrtec daily Add rhinocort 2 sprays each nostril daily. Take this medication at least through the Spring allergy season

## 2017-10-23 NOTE — Patient Instructions (Signed)
Please continue your albuterol nebulized up to every 4 hours if needed for shortness of breath Continue your zyrtec daily Add rhinocort 2 sprays each nostril daily. Take this medication at least through the Spring allergy season We will refill your chantix  Work hard on decreasing your smoking. Ultimate goal will be to set a quit date and stop completely.  Follow with Dr Lamonte Sakai in 6 months or sooner if you have any problems

## 2017-10-23 NOTE — Assessment & Plan Note (Signed)
Has been difficult to get her on an ICS/LABA > either cause side effects or not covered by her insurance. Based on her frequency of albuterol use (which is less than previously) she would like to remains on albuterol prn alone. If her frequency of use goes back up then we will likely need to continue to search for long acting maintenance BD  Please continue your albuterol nebulized up to every 4 hours if needed for shortness of breath

## 2017-10-23 NOTE — Progress Notes (Signed)
Subjective:    Patient ID: Frances Nelson, female    DOB: 09-Jul-1956, 62 y.o.   MRN: [id number]  COPD  She complains of cough and shortness of breath. There is no wheezing. Associated symptoms include ear pain, headaches, sneezing and a sore throat. Pertinent negatives include no fever, postnasal drip, rhinorrhea or trouble swallowing. Her past medical history is significant for COPD.   62 year old active smoker (60 pack years) with a history of childhood asthma, allergic rhinitis, positive ANA and anti-RNP, has been seen in the past by Dr. Camillo Flaming for asthma. She began to have recurrent cough, wheeze, dyspnea in her mid-20's. Has been on and off scheduled inhaled meds, most recently Breo (has been on Advair, Symbicort before) but stopped due to mouth sores, throat irritation. She takes her ventolin or albuterol nebs 2-3x a day right now, usually about once a day. Her most significant trigger is allergic rhinitis, she has multiple allergies documented skin testing, never on immunotherapy. Other triggers include cats, perfumes, smoke. Last spiro was 2016. Hasn't started chantix yet but set a date for 07/17/17.   ROV 08/07/17 --follow-up visit for dyspnea.  She has a history of tobacco use, childhood asthma, chronic cough, allergic rhinitis.  We started fluticasone nasal spray, change her Zyrtec to loratadine. She is having more congestion, sneezing now. She stopped the flonase due to burning sensation.   She had pulmonary function testing done today that I have reviewed.  This shows mixed obstruction and restriction on spirometry, obstruction confirmed by a positive bronchodilator response.  Her lung volumes are grossly normal as is her diffusion capacity. She continues to cough. She is working to decrease her cigarettes, about 5-10 a day. She hasn't had any Bronkaid in a month. She has been on several scheduled ICS / LABA before, not currently.          ROV 10/23/17 --62 year old woman who follows up today for  evaluation of her continued tobacco use, obstructive lung disease on spirometry with a positive bronchodilator response.  She also has chronic cough, allergic rhinitis. At her last visit I tried to start brovana/ pulmicort (because she has failed advair, symbicort, breo) but no insurance coverage.   she is currently only using albuterol nebs prn.  She uses it few times a week. She is able to do her usual activities. No wheeze, minimal cough. She is on chantix, has been using the starter pack, needs to change to the maintenance pack. She has been off of it for a month. She is smoking about 1-1.5 pks/day currently. She is using zyrtec.        Review of Systems  Constitutional: Negative for fever and unexpected weight change.  HENT: Positive for congestion, dental problem, ear pain, sneezing and sore throat. Negative for nosebleeds, postnasal drip, rhinorrhea, sinus pressure and trouble swallowing.   Eyes: Negative for redness and itching.  Respiratory: Positive for cough and shortness of breath. Negative for chest tightness and wheezing.   Cardiovascular: Negative for palpitations and leg swelling.  Gastrointestinal: Negative for nausea and vomiting.  Genitourinary: Negative for dysuria.  Musculoskeletal: Negative for joint swelling.  Skin: Negative for rash.  Neurological: Positive for headaches.  Hematological: Does not bruise/bleed easily.  Psychiatric/Behavioral: Negative for dysphoric mood. The patient is not nervous/anxious.    Past Medical History:  Diagnosis Date  . Asthma      No family history on file.   Social History   Socioeconomic History  . Marital status: Divorced  Spouse name: Not on file  . Number of children: Not on file  . Years of education: Not on file  . Highest education level: Not on file  Occupational History  . Not on file  Social Needs  . Financial resource strain: Not on file  . Food insecurity:    Worry: Not on file    Inability: Not on file  .  Transportation needs:    Medical: Not on file    Non-medical: Not on file  Tobacco Use  . Smoking status: Current Every Day Smoker    Packs/day: 0.75    Years: 40.00    Pack years: 30.00    Types: Cigarettes  . Smokeless tobacco: Never Used  Substance and Sexual Activity  . Alcohol use: No  . Drug use: Not on file  . Sexual activity: Not on file  Lifestyle  . Physical activity:    Days per week: Not on file    Minutes per session: Not on file  . Stress: Not on file  Relationships  . Social connections:    Talks on phone: Not on file    Gets together: Not on file    Attends religious service: Not on file    Active member of club or organization: Not on file    Attends meetings of clubs or organizations: Not on file    Relationship status: Not on file  . Intimate partner violence:    Fear of current or ex partner: Not on file    Emotionally abused: Not on file    Physically abused: Not on file    Forced sexual activity: Not on file  Other Topics Concern  . Not on file  Social History Narrative  . 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  No [employer]   Allergies  Allergen Reactions  . Symbicort [Budesonide-Formoterol Fumarate] Other (See Comments)    Patient reported dizziness, nausea, headaches and sore throat while using Symbicort 160. Reported on 09/03/17  . Celebrex [Celecoxib]     Bleeding.    . Chocolate Flavor   . Ciprofloxacin Nausea And Vomiting    Headache, shaking.   . Eggs Or Egg-Derived Products   . Gabapentin   . Lyrica [Pregabalin]     "Made me out of it."   . Tramadol   . Wellbutrin [Bupropion]   . Codeine Palpitations     Outpatient Medications Prior to Visit  Medication Sig Dispense Refill  . albuterol (PROVENTIL HFA;VENTOLIN HFA) 108 (90 BASE) MCG/ACT inhaler Inhale 2 puffs into the lungs every 2 (two) hours as needed for wheezing or shortness of breath (cough). 1 Inhaler 0  . albuterol (PROVENTIL) (2.5 MG/3ML)  0.083% nebulizer solution Take 2.5 mg by nebulization every 4 (four) hours as needed for wheezing or shortness of breath.    . cetirizine (ZYRTEC) 10 MG tablet Take 10 mg by mouth daily.    . Multiple Vitamin (MULTIVITAMIN WITH MINERALS) TABS tablet Take 1 tablet by mouth daily.    Marland Kitchen omeprazole (PRILOSEC) 40 MG capsule Take 40 mg by mouth daily.    . ranitidine (ZANTAC) 150 MG tablet Take 150 mg by mouth daily.    . tizanidine (ZANAFLEX) 6 MG capsule Take 6 mg by mouth at bedtime.    Marland Kitchen loratadine (CLARITIN) 10 MG tablet Take 10 mg by mouth daily.    . varenicline (CHANTIX) 1 MG tablet Take 1 mg by mouth 2 (two) times  daily.    . arformoterol (BROVANA) 15 MCG/2ML NEBU Take 2 mLs (15 mcg total) by nebulization 2 (two) times daily. 120 mL 5  . azithromycin (ZITHROMAX) 250 MG tablet Take 2 tablets on first day, then 1 tablet daily until finished. 6 tablet 0  . budesonide (PULMICORT) 0.5 MG/2ML nebulizer solution Take 2 mLs (0.5 mg total) by nebulization 2 (two) times daily. 120 mL 5  . formoterol (PERFOROMIST) 20 MCG/2ML nebulizer solution Take 2 mLs (20 mcg total) by nebulization 2 (two) times daily. 120 mL 0   No facility-administered medications prior to visit.         Objective:   Physical Exam Vitals:   10/23/17 1204  BP: (!) 150/86  Pulse: 91  SpO2: 97%  Weight: 204 lb (92.5 kg)  Height: 5' 3.5" (1.613 m)   Gen: Pleasant, overweight woman, in no distress,  normal affect  ENT: No lesions,  mouth clear,  oropharynx clear, no postnasal drip  Neck: No JVD, no stridor  Lungs: No use of accessory muscles, distant, clear without rales or rhonchi  Cardiovascular: RRR, heart sounds normal, no murmur or gallops, no peripheral edema  Musculoskeletal: No deformities, no cyanosis or clubbing  Neuro: alert, non focal  Skin: Warm, no lesions or rash      Assessment & Plan:  Tobacco abuse Discussed smoking today. She is smoking more than ever. Feels that she need chantix just to  decrease. We will fill the maintenance script for her. Need her to cut down significantly before we are ready to set a quit date.  Allergic rhinitis  Continue your zyrtec daily Add rhinocort 2 sprays each nostril daily. Take this medication at least through the Spring allergy season  COPD with asthma ([place]) Has been difficult to get her on an ICS/LABA > either cause side effects or not covered by her insurance. Based on her frequency of albuterol use (which is less than previously) she would like to remains on albuterol prn alone. If her frequency of use goes back up then we will likely need to continue to search for long acting maintenance BD  Please continue your albuterol nebulized up to every 4 hours if needed for shortness of breath   Baltazar Apo, MD, PhD 10/23/2017, 12:27 PM [facility] [phone] or if no answer [phone]

## 2017-10-23 NOTE — Assessment & Plan Note (Signed)
Discussed smoking today. She is smoking more than ever. Feels that she need chantix just to decrease. We will fill the maintenance script for her. Need her to cut down significantly before we are ready to set a quit date.

## 2017-11-13 ENCOUNTER — Encounter: Payer: Self-pay | Admitting: Family Medicine

## 2017-11-13 ENCOUNTER — Ambulatory Visit: Payer: BLUE CROSS/BLUE SHIELD | Admitting: Family Medicine

## 2017-11-13 VITALS — BP 160/90 | HR 62 | Ht 63.5 in | Wt 209.2 lb

## 2017-11-13 DIAGNOSIS — M4712 Other spondylosis with myelopathy, cervical region: Secondary | ICD-10-CM

## 2017-11-13 DIAGNOSIS — M329 Systemic lupus erythematosus, unspecified: Secondary | ICD-10-CM | POA: Diagnosis not present

## 2017-11-13 DIAGNOSIS — R030 Elevated blood-pressure reading, without diagnosis of hypertension: Secondary | ICD-10-CM | POA: Diagnosis not present

## 2017-11-13 DIAGNOSIS — G959 Disease of spinal cord, unspecified: Secondary | ICD-10-CM

## 2017-11-13 DIAGNOSIS — J449 Chronic obstructive pulmonary disease, unspecified: Secondary | ICD-10-CM

## 2017-11-13 DIAGNOSIS — Z720 Tobacco use: Secondary | ICD-10-CM

## 2017-11-13 DIAGNOSIS — M7660 Achilles tendinitis, unspecified leg: Secondary | ICD-10-CM | POA: Diagnosis not present

## 2017-11-13 DIAGNOSIS — M5412 Radiculopathy, cervical region: Secondary | ICD-10-CM | POA: Insufficient documentation

## 2017-11-13 MED ORDER — METHOCARBAMOL 500 MG PO TABS: 500.0000 mg | ORAL_TABLET | Freq: Two times a day (BID) | ORAL | 2 refills | Status: DC

## 2017-11-13 MED ORDER — TIZANIDINE HCL 6 MG PO CAPS: 6.0000 mg | ORAL_CAPSULE | Freq: Every day | ORAL | 2 refills | Status: DC

## 2017-11-13 NOTE — Assessment & Plan Note (Signed)
Weakness in RUE with numbness and tingling concerning for cervical spine pathology. Refilled muscle relaxer Start sterapred that she has at home MRI ordered given acutely worsening symptoms with neurological changes (weakness)

## 2017-11-13 NOTE — Assessment & Plan Note (Signed)
Stable, she'll continue to follow with pulmonology.

## 2017-11-13 NOTE — Assessment & Plan Note (Signed)
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 She will start sterapred pack that she already has as it appears she has had bleeding with NSAIDS. Referral to podiatry.

## 2017-11-13 NOTE — Patient Instructions (Signed)
It was very nice to meet you I have placed referrals to rheumatology and podiatry I have ordered and MRI of your neck as well Restart muscle relaxer as needed Start sterapred pack  I will see you back in about 4 weeks for follow up.

## 2017-11-13 NOTE — Progress Notes (Signed)
Frances Nelson - 62 y.o. female MRN [id number]  Date of birth: Apr 03, 1956  Subjective Chief Complaint  Patient presents with  . Shoulder Pain  . Foot Pain    HPI Frances Nelson is a 62 y.o. female here today to establish with new pcp.  She has a history of Asthma/COPD with continued nicotine dependence and reports a history of SLE with history of positive ANA.  Complains of R shoulder pain and L heel pain today.   -Shoulder Pain:  Pain in R shoulder x3 days.  Reports pain started when she woke up on Sunday with continued worsening since that time.  Finds that she has weakness with associated numbness and tingling in the arm as well.  Pain seems to radiate from her neck and seems to worsen with movement.  She has not tried anything for treatment so far. She denies any recent injury or known overuse.  She believes this may be related to SLE.    -Heel pain:  Pain in posterior left heel area at attachment of achilles tendon.  Area is tender to touch with swelling and mild warmth.  She has tried icing and muscle rubs without improvement.  Believes this may also be related to SLE.  Denies numbness/tingling into the foot or weakness.   -Auto-immune d/o:  Reports history of SLE with positive ANA.  Has seen rheumatologist previously and notes from previous pcp indicate that she was not pleased with visit from previous rheumatologist.  She has never been treated with DMARD's.  She tells me that her PCP at [facility] checked auto-immune labs in January as well.  She denies fever or chills.    -Asthma/COPD:  History of asthma/copd overlap, followed by pulmonology. Stable symptoms at this time.  Unfortunately she continues to smoke but has cut back from 1ppd to 5 cigarettes with chantix.    ROS:  ROS completed and negative except as noted per HPI Allergies  Allergen Reactions  . Symbicort [Budesonide-Formoterol Fumarate] Other (See Comments)    Patient reported dizziness, nausea, headaches and sore throat while  using Symbicort 160. Reported on 09/03/17  . Celebrex [Celecoxib]     Bleeding.    . Chocolate Flavor   . Ciprofloxacin Nausea And Vomiting    Headache, shaking.   . Eggs Or Egg-Derived Products   . Gabapentin   . Lyrica [Pregabalin]     "Made me out of it."   . Tramadol   . Wellbutrin [Bupropion]   . Codeine Palpitations    Past Medical History:  Diagnosis Date  . Asthma     Past Surgical History:  Procedure Laterality Date  . ABDOMINAL HYSTERECTOMY    . APPENDECTOMY    . TONSILLECTOMY      Social History   Socioeconomic History  . Marital status: Divorced    Spouse name: Not on file  . Number of children: Not on file  . Years of education: Not on file  . Highest education level: Not on file  Occupational History  . Not on file  Social Needs  . Financial resource strain: Not on file  . Food insecurity:    Worry: Not on file    Inability: Not on file  . Transportation needs:    Medical: Not on file    Non-medical: Not on file  Tobacco Use  . Smoking status: Current Every Day Smoker    Packs/day: 0.75    Years: 40.00    Pack years: 30.00    Types: Cigarettes  .  Smokeless tobacco: Never Used  Substance and Sexual Activity  . Alcohol use: No  . Drug use: Never  . Sexual activity: Not on file  Lifestyle  . Physical activity:    Days per week: Not on file    Minutes per session: Not on file  . Stress: Not on file  Relationships  . Social connections:    Talks on phone: Not on file    Gets together: Not on file    Attends religious service: Not on file    Active member of club or organization: Not on file    Attends meetings of clubs or organizations: Not on file    Relationship status: Not on file  Other Topics Concern  . Not on file  Social History Narrative  . Not on file    Family History  Problem Relation Age of Onset  . Leukemia Sister   . Brain cancer Brother        glioblastoma   . Diabetes Sister     Health Maintenance  Topic Date  Due  . Hepatitis C Screening  July 25, 1955  . HIV Screening  11/21/1970  . TETANUS/TDAP  11/21/1974  . PAP SMEAR  11/20/1976  . MAMMOGRAM  11/20/2005  . COLONOSCOPY  11/20/2005  . INFLUENZA VACCINE  02/19/2018 (Originally 02/14/2018)    ----------------------------------------------------------------------------------------------------------------------------------------------------------------------------------------------------------------- Physical Exam BP (!) 160/90 (BP Location: Left Arm, Patient Position: Sitting, Cuff Size: Normal)   Pulse 62   Ht 5' 3.5" (1.613 m)   Wt 209 lb 3.2 oz (94.9 kg)   BMI 36.48 kg/m   Physical Exam  Constitutional: She is oriented to person, place, and time. She appears well-nourished. No distress.  HENT:  Head: Normocephalic and atraumatic.  Mouth/Throat: Oropharynx is clear and moist.  Eyes: No scleral icterus.  Neck: Neck supple. No thyromegaly present.  Cardiovascular: Normal rate, regular rhythm, normal heart sounds and intact distal pulses.  Pulmonary/Chest: Effort normal and breath sounds normal. She has no wheezes.  Musculoskeletal:  L heel with haglund deformity along posterior heel.  This area is tender to palpation with significant swelling and is non-ballotable.  She has pain with dorsiflexion of the foot.  R shoulder with ttp along the upper trapezius and deltoid with spasm.  ROM is difficult and painful in all planes.  Arm and grip strength is 4/5 compared to 5/5 on the L.  She has slightly diminished reflexes on the R compared to the L.  Special testing of specific muscle groups is limited due to her pain.    Lymphadenopathy:    She has no cervical adenopathy.  Neurological: She is alert and oriented to person, place, and time.  Psychiatric: She has a normal mood and affect. Her behavior is normal.     ------------------------------------------------------------------------------------------------------------------------------------------------------------------------------------------------------------------- Assessment and Plan  History of systemic lupus erythematosus (SLE) (HCC) History of positive ANA, reports additional labs completed at previous PCP.  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 She will start sterapred pack that she already has as it appears she has had bleeding with NSAIDS. Referral to podiatry.   Tobacco abuse Currently taking chantix ~5 min spent counseling on recommendations to quit smoking Advised to set a quit date soon while taking chantix.    Cervical myelopathy with cervical radiculopathy Weakness in RUE with numbness and tingling concerning for cervical spine pathology. Refilled muscle relaxer Start sterapred that she has at home MRI ordered  given acutely worsening symptoms with neurological changes (weakness)  COPD with asthma (HCC) Stable, she'll continue to follow with pulmonology.   Elevated blood pressure reading without diagnosis of hypertension No prior diagnosis of HTN. May be elevated secondary to pain, she will return in a few weeks to recheck.

## 2017-11-13 NOTE — Assessment & Plan Note (Signed)
History of positive ANA, reports additional labs completed at previous PCP.  Records requested Will refer back to rheumatology as well

## 2017-11-13 NOTE — Assessment & Plan Note (Signed)
Currently taking chantix ~5 min spent counseling on recommendations to quit smoking Advised to set a quit date soon while taking chantix.

## 2017-11-13 NOTE — Assessment & Plan Note (Signed)
No prior diagnosis of HTN. May be elevated secondary to pain, she will return in a few weeks to recheck.

## 2017-11-21 ENCOUNTER — Telehealth: Payer: Self-pay | Admitting: Emergency Medicine

## 2017-11-21 ENCOUNTER — Other Ambulatory Visit: Payer: Self-pay | Admitting: Family Medicine

## 2017-11-21 DIAGNOSIS — R768 Other specified abnormal immunological findings in serum: Secondary | ICD-10-CM

## 2017-11-21 DIAGNOSIS — M2550 Pain in unspecified joint: Secondary | ICD-10-CM

## 2017-11-21 NOTE — Telephone Encounter (Signed)
Orders entered

## 2017-11-21 NOTE — Telephone Encounter (Signed)
Referral coordinator has contacted the podiatry office regarding referral. The Podiatry office will contact patient now to set up an appointment.

## 2017-11-21 NOTE — Telephone Encounter (Signed)
Copied from [place] [id number]. Topic: Referral - Request >> Nov 21, 2017 11:12 AM Aurelio Brash B wrote: Reason for CRM: PT is requesting a referral to rheumatologist  for autoimmune disease

## 2017-11-28 ENCOUNTER — Ambulatory Visit: Payer: BLUE CROSS/BLUE SHIELD | Admitting: Family Medicine

## 2017-11-28 ENCOUNTER — Encounter: Payer: Self-pay | Admitting: Family Medicine

## 2017-11-28 DIAGNOSIS — J0140 Acute pansinusitis, unspecified: Secondary | ICD-10-CM

## 2017-11-28 MED ORDER — AMOXICILLIN-POT CLAVULANATE 875-125 MG PO TABS: 1.0000 | ORAL_TABLET | Freq: Two times a day (BID) | ORAL | 0 refills | Status: DC

## 2017-11-28 NOTE — Progress Notes (Signed)
Frances Nelson - 62 y.o. female MRN [id number]  Date of birth: [birth year]  Subjective Chief Complaint  Patient presents with  . Sinus Problem    Sinus drainage, sore throat-denies fevers-no producitve cough-has been taking Alka seltzer with no improvement    HPI Frances Nelson is a 62 y.o. female with a history of asthma, SLE, and nicotine dependence here today with complaint of sinus pain and drainage.  Reports that symptoms began yesterday and have acutely worsened over the past 24 hours.  She has pain throughout her sinuses along with sore throat, cough and raspy voice.  She has been using her nebulizer as needed but hasn't really noticed a whole lot of wheezing.  She has also used alka seltzer plus sinus which has provided mild relief.  She has not had fever, chills, increased headaches, nausea or vomiting, ear pain or vision changes.  She does have allergic rhinitis however is compliant with zyrtec and flonase, she feels that this is different than her typical allergies.   ROS: ROS completed and negative except as noted per HPI   Allergies  Allergen Reactions  . Symbicort [Budesonide-Formoterol Fumarate] Other (See Comments)    Patient reported dizziness, nausea, headaches and sore throat while using Symbicort 160. Reported on 09/03/17  . Celebrex [Celecoxib]     Bleeding.    . Chocolate Flavor   . Ciprofloxacin Nausea And Vomiting    Headache, shaking.   . Eggs Or Egg-Derived Products   . Gabapentin   . Lyrica [Pregabalin]     "Made me out of it."   . Tramadol   . Wellbutrin [Bupropion]   . Codeine Palpitations    Past Medical History:  Diagnosis Date  . Asthma     Past Surgical History:  Procedure Laterality Date  . ABDOMINAL HYSTERECTOMY    . APPENDECTOMY    . TONSILLECTOMY      Social History   Socioeconomic History  . Marital status: Divorced    Spouse name: Not on file  . Number of children: Not on file  . Years of education: Not on file  . Highest  education level: Not on file  Occupational History  . Not on file  Social Needs  . Financial resource strain: Not on file  . Food insecurity:    Worry: Not on file    Inability: Not on file  . Transportation needs:    Medical: Not on file    Non-medical: Not on file  Tobacco Use  . Smoking status: Current Every Day Smoker    Packs/day: 0.75    Years: 40.00    Pack years: 30.00    Types: Cigarettes  . Smokeless tobacco: Never Used  Substance and Sexual Activity  . Alcohol use: No  . Drug use: Never  . Sexual activity: Not on file  Lifestyle  . Physical activity:    Days per week: Not on file    Minutes per session: Not on file  . Stress: Not on file  Relationships  . Social connections:    Talks on phone: Not on file    Gets together: Not on file    Attends religious service: Not on file    Active member of club or organization: Not on file    Attends meetings of clubs or organizations: Not on file    Relationship status: Not on file  Other Topics Concern  . Not on file  Social History Narrative  . Not on file    Family  History  Problem Relation Age of Onset  . Leukemia Sister   . Brain cancer Brother        glioblastoma   . Diabetes Sister     Health Maintenance  Topic Date Due  . Hepatitis C Screening  [birth year]  . TETANUS/TDAP  11/21/1974  . PAP SMEAR  11/20/1976  . MAMMOGRAM  11/20/2005  . COLONOSCOPY  11/20/2005  . INFLUENZA VACCINE  02/19/2018 (Originally 02/14/2018)  . HIV Screening  11/14/2018 (Originally 11/21/1970)    ----------------------------------------------------------------------------------------------------------------------------------------------------------------------------------------------------------------- Physical Exam BP (!) 141/82 (BP Location: Left Arm, Patient Position: Sitting, Cuff Size: Normal)   Pulse 94   Temp 98.5 F (36.9 C) (Oral)   Ht 5' 3.5" (1.613 m)   Wt 209 lb (94.8 kg)   SpO2 96%   BMI 36.44 kg/m    Physical Exam  Constitutional: She is oriented to person, place, and time. She appears well-nourished. No distress.  HENT:  Head: Normocephalic and atraumatic.  Posterior OP is erythematous without exudate Turbinates inflamed Frontal and Maxillary sinus TTP bilaterally.   Eyes: No scleral icterus.  Neck: No thyromegaly present.  Cardiovascular: Normal rate, regular rhythm and normal heart sounds.  Pulmonary/Chest: Effort normal and breath sounds normal. No stridor. She has no wheezes.  Lymphadenopathy:    She has cervical adenopathy.  Neurological: She is alert and oriented to person, place, and time.  Skin: Skin is warm and dry. No rash noted.  Psychiatric: She has a normal mood and affect. Her behavior is normal.    ------------------------------------------------------------------------------------------------------------------------------------------------------------------------------------------------------------------- Assessment and Plan  Acute non-recurrent pansinusitis Increase fluids and rest Start augmentin May continue alka seltzer + sinus as needed

## 2017-11-28 NOTE — Patient Instructions (Signed)
Please continue zyrtec and flonase You may continue alka-seltzer plus sinus-  Be sure to drink plenty of fluids with this.   Sinusitis, Adult Sinusitis is soreness and inflammation of your sinuses. Sinuses are hollow spaces in the bones around your face. They are located:  Around your eyes.  In the middle of your forehead.  Behind your nose.  In your cheekbones.  Your sinuses and nasal passages are lined with a stringy fluid (mucus). Mucus normally drains out of your sinuses. When your nasal tissues get inflamed or swollen, the mucus can get trapped or blocked so air cannot flow through your sinuses. This lets bacteria, viruses, and funguses grow, and that leads to infection. Follow these instructions at home: Medicines  Take, use, or apply over-the-counter and prescription medicines only as told by your doctor. These may include nasal sprays.  If you were prescribed an antibiotic medicine, take it as told by your doctor. Do not stop taking the antibiotic even if you start to feel better. Hydrate and Humidify  Drink enough water to keep your pee (urine) clear or pale yellow.  Use a cool mist humidifier to keep the humidity level in your home above 50%.  Breathe in steam for 10-15 minutes, 3-4 times a day or as told by your doctor. You can do this in the bathroom while a hot shower is running.  Try not to spend time in cool or dry air. Rest  Rest as much as possible.  Sleep with your head raised (elevated).  Make sure to get enough sleep each night. General instructions  Put a warm, moist washcloth on your face 3-4 times a day or as told by your doctor. This will help with discomfort.  Wash your hands often with soap and water. If there is no soap and water, use hand sanitizer.  Do not smoke. Avoid being around people who are smoking (secondhand smoke).  Keep all follow-up visits as told by your doctor. This is important. Contact a doctor if:  You have a fever.  Your  symptoms get worse.  Your symptoms do not get better within 10 days. Get help right away if:  You have a very bad headache.  You cannot stop throwing up (vomiting).  You have pain or swelling around your face or eyes.  You have trouble seeing.  You feel confused.  Your neck is stiff.  You have trouble breathing. This information is not intended to replace advice given to you by your health care provider. Make sure you discuss any questions you have with your health care provider. Document Released: 12/20/2007 Document Revised: 02/27/2016 Document Reviewed: 04/28/2015 Elsevier Interactive Patient Education  [employer].

## 2017-11-28 NOTE — Assessment & Plan Note (Signed)
Increase fluids and rest Start augmentin May continue alka seltzer + sinus as needed

## 2017-12-06 ENCOUNTER — Encounter: Payer: Self-pay | Admitting: Podiatry

## 2017-12-06 ENCOUNTER — Ambulatory Visit: Payer: BLUE CROSS/BLUE SHIELD | Admitting: Podiatry

## 2017-12-06 VITALS — BP 173/88 | HR 83 | Ht 63.0 in | Wt 200.0 lb

## 2017-12-06 DIAGNOSIS — M7660 Achilles tendinitis, unspecified leg: Secondary | ICD-10-CM

## 2017-12-06 DIAGNOSIS — M216X2 Other acquired deformities of left foot: Secondary | ICD-10-CM

## 2017-12-06 DIAGNOSIS — M216X1 Other acquired deformities of right foot: Secondary | ICD-10-CM | POA: Diagnosis not present

## 2017-12-06 DIAGNOSIS — M659 Synovitis and tenosynovitis, unspecified: Secondary | ICD-10-CM

## 2017-12-06 DIAGNOSIS — M21961 Unspecified acquired deformity of right lower leg: Secondary | ICD-10-CM | POA: Diagnosis not present

## 2017-12-06 MED ORDER — DICLOFENAC SODIUM 1 % TD GEL: 2.0000 g | Freq: Four times a day (QID) | TRANSDERMAL | 1 refills | Status: DC

## 2017-12-06 NOTE — Progress Notes (Signed)
SUBJECTIVE: 62 y.o. year old female presents for pain in left big toe and side of right foot at the base of 5th metatarsal bone.  A statue fell and landed on left big toe 2 weeks ago. Been swollen and painful. On feet about 50/50 at work. Does minimum exercise. Patient is referred by Dr. Zigmund Daniel.   Has Autoimmune disease. Diagnosed with SLE 2015, Cervical myelopathy with cervical radiculopathy.  Review of Systems  HENT: Negative.   Eyes: Negative.   Respiratory:       Asthma, COPD  Cardiovascular: Negative.   Gastrointestinal: Positive for heartburn.       Acid reflux controlled with medication.  Genitourinary: Negative.   Musculoskeletal: Positive for back pain, joint pain and neck pain.       Hips, knees, shoulders, neck.  Skin: Negative.      OBJECTIVE: DERMATOLOGIC EXAMINATION: Mild bruise over IPJ left great toe.  VASCULAR EXAMINATION OF LOWER LIMBS: All pedal pulses are palpable with normal pulsation.  Capillary Filling times within 3 seconds in all digits.  Mild inflamed and swollen Achilles tendon insertion site left with pain when tendon was stretched. Temperature gradient from tibial crest to dorsum of foot is within normal bilateral.  NEUROLOGIC EXAMINATION OF THE LOWER LIMBS: All epicritic and tactile sensations grossly intact. Sharp and Dull discriminatory sensations at the plantar ball of hallux is intact bilateral.   MUSCULOSKELETAL EXAMINATION: Positive for tight Achilles tendon on right, enlarged Haglund's deformity left foot with increased temperature. Hypermobile first ray bilateral.  RADIOGRAPHIC STUDIES:  AP View:  No changes seen at the affected left great toe. Lateral view:  High arched cavus type foot with elevated first ray bilateral. Plantar and posterior calcaneal spur bilateral.  ASSESSMENT: S/P Left great toe injury, limited to soft tissue. Hypermobile first ray with lateral weight shifting right. Tenosynovitis lateral column mid foot  right. Achilles tendonitis left. Achilles tendon contracture right.   PLAN: Reviewed findings and available treatment options, NSAIA, exercise, proper shoe gear, orthotics, braces, Reviewed stretch exercise to practice daily for tight Achilles tendon on right. Prescribed Voltaren gel to apply to inflamed left Achilles tendon area. May benefit from custom orthotics and Equinus brace. Return in 2 weeks to re evaluate.

## 2017-12-06 NOTE — Patient Instructions (Signed)
Seen for bilateral foot pain. Noted of tight Achilles tendon on right, inflamed tendon on left. Lateral weight shifting on right. Injured left great toe has no broken bone in X-ray. Voltaren gel prescribed for pain in left heel. May benefit from custom orthotics and Equinus brace. Will get insurance benefit info. Do daily stretch exercise as instructed. Return in 2 weeks.

## 2017-12-11 ENCOUNTER — Telehealth: Payer: Self-pay | Admitting: Emergency Medicine

## 2017-12-11 ENCOUNTER — Encounter: Payer: Self-pay | Admitting: Family Medicine

## 2017-12-11 ENCOUNTER — Telehealth: Payer: Self-pay | Admitting: *Deleted

## 2017-12-11 ENCOUNTER — Ambulatory Visit: Payer: BLUE CROSS/BLUE SHIELD | Admitting: Family Medicine

## 2017-12-11 VITALS — BP 138/90 | HR 82 | Temp 98.4°F | Wt 212.2 lb

## 2017-12-11 DIAGNOSIS — R030 Elevated blood-pressure reading, without diagnosis of hypertension: Secondary | ICD-10-CM

## 2017-12-11 DIAGNOSIS — J0140 Acute pansinusitis, unspecified: Secondary | ICD-10-CM | POA: Diagnosis not present

## 2017-12-11 DIAGNOSIS — M7660 Achilles tendinitis, unspecified leg: Secondary | ICD-10-CM

## 2017-12-11 MED ORDER — AZITHROMYCIN 250 MG PO TABS
ORAL_TABLET | ORAL | 0 refills | Status: DC
Start: 2017-12-11 — End: 2018-03-15

## 2017-12-11 NOTE — Progress Notes (Signed)
Frances Nelson - 62 y.o. female MRN [id number]  Date of birth: Mar 15, 1956  Subjective Chief Complaint  Patient presents with  . Follow-up    hypertension    HPI Frances Nelson is a 62 y.o. female here today for follow up of elevated blood pressure and heel pain.  She also complains of continued sinus pain and congestion.    -Elevated BP:  BP elevated at previous appt however had acute pain at initial appt with me and was taking decongestant at second appt.  She does not have a history of HTN and her BP is better controlled today.  She denies chest pain, palpitations, headache or vision changes.   -Sinusitis:  Completed course of augmentin, still with continued symptoms.  Has sinus pain with yellow discharge as well as cough.  Denies fever, chills, shortness of breath or increased wheezing.   -Heel pain:  Recently seen by podiatry and prescribed topical diclofenac.  Still awaiting insurance approval and has f/u in ~2 weeks, considering orthosis.  She has history of SLE and chronic joint pain, still awaiting rheumatology referral.    ROS:  ROS completed and negative unless noted per HPI   Allergies  Allergen Reactions  . Symbicort [Budesonide-Formoterol Fumarate] Other (See Comments)    Patient reported dizziness, nausea, headaches and sore throat while using Symbicort 160. Reported on 09/03/17  . Celebrex [Celecoxib]     Bleeding.    . Chocolate Flavor   . Ciprofloxacin Nausea And Vomiting    Headache, shaking.   . Eggs Or Egg-Derived Products   . Gabapentin   . Lyrica [Pregabalin]     "Made me out of it."   . Tramadol   . Wellbutrin [Bupropion]   . Codeine Palpitations    Past Medical History:  Diagnosis Date  . Asthma     Past Surgical History:  Procedure Laterality Date  . ABDOMINAL HYSTERECTOMY    . APPENDECTOMY    . TONSILLECTOMY      Social History   Socioeconomic History  . Marital status: Divorced    Spouse name: Not on file  . Number of children: Not on  file  . Years of education: Not on file  . Highest education level: Not on file  Occupational History  . Not on file  Social Needs  . Financial resource strain: Not on file  . Food insecurity:    Worry: Not on file    Inability: Not on file  . Transportation needs:    Medical: Not on file    Non-medical: Not on file  Tobacco Use  . Smoking status: Current Every Day Smoker    Packs/day: 0.75    Years: 40.00    Pack years: 30.00    Types: Cigarettes  . Smokeless tobacco: Never Used  Substance and Sexual Activity  . Alcohol use: No  . Drug use: Never  . Sexual activity: Not on file  Lifestyle  . Physical activity:    Days per week: Not on file    Minutes per session: Not on file  . Stress: Not on file  Relationships  . Social connections:    Talks on phone: Not on file    Gets together: Not on file    Attends religious service: Not on file    Active member of club or organization: Not on file    Attends meetings of clubs or organizations: Not on file    Relationship status: Not on file  Other Topics Concern  . Not  on file  Social History Narrative  . Not on file    Family History  Problem Relation Age of Onset  . Leukemia Sister   . Brain cancer Brother        glioblastoma   . Diabetes Sister     Health Maintenance  Topic Date Due  . Hepatitis C Screening  Mar 21, 1956  . TETANUS/TDAP  11/21/1974  . PAP SMEAR  11/20/1976  . MAMMOGRAM  11/20/2005  . COLONOSCOPY  11/20/2005  . INFLUENZA VACCINE  02/19/2018 (Originally 02/14/2018)  . HIV Screening  11/14/2018 (Originally 11/21/1970)    ----------------------------------------------------------------------------------------------------------------------------------------------------------------------------------------------------------------- Physical Exam BP 138/90 (BP Location: Right Arm, Patient Position: Sitting, Cuff Size: Normal)   Pulse 82   Temp 98.4 F (36.9 C) (Oral)   Wt 212 lb 3.2 oz (96.3 kg)    SpO2 96%   BMI 37.59 kg/m   Physical Exam  Constitutional: She is oriented to person, place, and time. She appears well-nourished. No distress.  HENT:  Head: Normocephalic and atraumatic.  Right Ear: Ear canal normal. A middle ear effusion (serous) is present.  Left Ear: Tympanic membrane and ear canal normal.  No middle ear effusion.  Nares congested Posterior OP erythema +sinus tenderness throughout all sinuses.   Eyes: No scleral icterus.  Neck: Neck supple. No thyromegaly present.  Cardiovascular: Normal rate, regular rhythm and normal heart sounds.  Pulmonary/Chest: Effort normal and breath sounds normal. No stridor. No respiratory distress.  Lymphadenopathy:    She has no cervical adenopathy.  Neurological: She is alert and oriented to person, place, and time.  Skin: Skin is warm. No rash noted.  Psychiatric: She has a normal mood and affect. Her behavior is normal.    ------------------------------------------------------------------------------------------------------------------------------------------------------------------------------------------------------------------- Assessment and Plan  Acute non-recurrent pansinusitis Continued symptoms Rx for azithromycin Continue symptomatic treatment.   Elevated blood pressure reading without diagnosis of hypertension BP is fairly well controlled today, continue to monitor.   Follow low salt diet Avoid nicotine products.   Achilles tendon pain Recently seen by podiatry, awaiting approval of diclofenac gel Hx of SLE, rheumatology referral has been placed.

## 2017-12-11 NOTE — Telephone Encounter (Signed)
Spoke with patient regarding MRI and [facility] trying to contact patient to schedule an appointment. Patient was given [facility] contact information and was told to give them a call. Patient states she is going to hold off on setting that appointment up due to insurance issues. PCP is aware nothing further needed.

## 2017-12-11 NOTE — Patient Instructions (Signed)

## 2017-12-11 NOTE — Assessment & Plan Note (Signed)
Recently seen by podiatry, awaiting approval of diclofenac gel Hx of SLE, rheumatology referral has been placed.

## 2017-12-11 NOTE — Assessment & Plan Note (Signed)
BP is fairly well controlled today, continue to monitor.   Follow low salt diet Avoid nicotine products.

## 2017-12-11 NOTE — Assessment & Plan Note (Signed)
Continued symptoms Rx for azithromycin Continue symptomatic treatment.

## 2017-12-11 NOTE — Telephone Encounter (Signed)
Patient has a $400 deductible  None of which has been met. Patient max out of pocket is $600 and 215.16 has been satisfied. After deductible is met patient is responsible for 30% coinsurance  Called patient and let her know about orthotics and equinis brace benefits and she does not want to proceed at this time.

## 2017-12-20 ENCOUNTER — Ambulatory Visit: Payer: BLUE CROSS/BLUE SHIELD | Admitting: Podiatry

## 2018-01-10 ENCOUNTER — Ambulatory Visit: Payer: BLUE CROSS/BLUE SHIELD | Admitting: Podiatry

## 2018-01-23 ENCOUNTER — Other Ambulatory Visit: Payer: Self-pay | Admitting: Emergency Medicine

## 2018-02-07 ENCOUNTER — Other Ambulatory Visit: Payer: Self-pay | Admitting: Family Medicine

## 2018-02-07 MED ORDER — METHOCARBAMOL 500 MG PO TABS: 500.0000 mg | ORAL_TABLET | Freq: Two times a day (BID) | ORAL | 2 refills | Status: DC

## 2018-02-22 ENCOUNTER — Telehealth: Payer: Self-pay | Admitting: Emergency Medicine

## 2018-02-22 NOTE — Telephone Encounter (Signed)
She needs to hydrate well Limited activity for a couple days to get used to the altitude before pushing her exertion.  Keep her rescue albuterol available.

## 2018-02-22 NOTE — Telephone Encounter (Signed)
Spoke with pt. She is aware of RB's response. Nothing further was needed. 

## 2018-02-22 NOTE — Telephone Encounter (Signed)
Spoke with pt, she states she is going to [place] in September and she is concerned about the altitude and her asthma. She would like to know if there are any preventative measures or things she needs to do before going to [place]. Dr. Lamonte Sakai, please advise.   [place] 858ft  [place] 6,000 ft

## 2018-03-15 ENCOUNTER — Ambulatory Visit: Payer: BLUE CROSS/BLUE SHIELD | Admitting: Family Medicine

## 2018-03-15 ENCOUNTER — Encounter: Payer: Self-pay | Admitting: Family Medicine

## 2018-03-15 VITALS — BP 124/80 | HR 82 | Wt 211.8 lb

## 2018-03-15 DIAGNOSIS — J449 Chronic obstructive pulmonary disease, unspecified: Secondary | ICD-10-CM

## 2018-03-15 DIAGNOSIS — Z1239 Encounter for other screening for malignant neoplasm of breast: Secondary | ICD-10-CM

## 2018-03-15 DIAGNOSIS — Z1231 Encounter for screening mammogram for malignant neoplasm of breast: Secondary | ICD-10-CM | POA: Diagnosis not present

## 2018-03-15 DIAGNOSIS — R030 Elevated blood-pressure reading, without diagnosis of hypertension: Secondary | ICD-10-CM | POA: Diagnosis not present

## 2018-03-15 DIAGNOSIS — M659 Synovitis and tenosynovitis, unspecified: Secondary | ICD-10-CM | POA: Diagnosis not present

## 2018-03-15 DIAGNOSIS — Z23 Encounter for immunization: Secondary | ICD-10-CM | POA: Diagnosis not present

## 2018-03-15 DIAGNOSIS — R768 Other specified abnormal immunological findings in serum: Secondary | ICD-10-CM

## 2018-03-15 DIAGNOSIS — R7689 Other specified abnormal immunological findings in serum: Secondary | ICD-10-CM | POA: Insufficient documentation

## 2018-03-15 DIAGNOSIS — R21 Rash and other nonspecific skin eruption: Secondary | ICD-10-CM | POA: Insufficient documentation

## 2018-03-15 DIAGNOSIS — Z1211 Encounter for screening for malignant neoplasm of colon: Secondary | ICD-10-CM | POA: Insufficient documentation

## 2018-03-15 DIAGNOSIS — Z1159 Encounter for screening for other viral diseases: Secondary | ICD-10-CM | POA: Insufficient documentation

## 2018-03-15 LAB — TSH: TSH: 2.47 u[IU]/mL (ref 0.35–4.50)

## 2018-03-15 LAB — BASIC METABOLIC PANEL
BUN: 14 mg/dL (ref 6–23)
CO2: 32 mEq/L (ref 19–32)
Calcium: 10.1 mg/dL (ref 8.4–10.5)
Chloride: 100 mEq/L (ref 96–112)
Creatinine, Ser: 0.84 mg/dL (ref 0.40–1.20)
GFR: 72.95 mL/min (ref >60.00)
Glucose, Bld: 110 mg/dL — ABNORMAL HIGH (ref 70–99)
Potassium: 4.6 mEq/L (ref 3.5–5.1)
Sodium: 139 mEq/L (ref 135–145)

## 2018-03-15 LAB — CBC
HCT: 43 % (ref 36.0–46.0)
Hemoglobin: 14.4 g/dL (ref 12.0–15.0)
MCHC: 33.5 g/dL (ref 30.0–36.0)
MCV: 92.2 fl (ref 78.0–100.0)
Platelets: 347 10*3/uL (ref 150.0–400.0)
RBC: 4.67 Mil/uL (ref 3.87–5.11)
RDW: 12.9 % (ref 11.5–15.5)
WBC: 7.3 10*3/uL (ref 4.0–10.5)

## 2018-03-15 LAB — SEDIMENTATION RATE: Sed Rate: 19 mm/hr (ref 0–30)

## 2018-03-15 MED ORDER — ALBUTEROL SULFATE HFA 108 (90 BASE) MCG/ACT IN AERS: 2.0000 | INHALATION_SPRAY | RESPIRATORY_TRACT | 3 refills | Status: DC | PRN

## 2018-03-15 MED ORDER — MOMETASONE FUROATE 0.1 % EX CREA: 1.0000 "application " | TOPICAL_CREAM | Freq: Every day | CUTANEOUS | 0 refills | Status: DC

## 2018-03-15 NOTE — Assessment & Plan Note (Signed)
Screening mammogram ordered

## 2018-03-15 NOTE — Assessment & Plan Note (Signed)
Continue swelling of achilles, now with knee joint involvement Has bakers cyst and what appears to be pre-patellar bursitis.  Referral placed to return to rheumatology, updated labs ordered.

## 2018-03-15 NOTE — Assessment & Plan Note (Signed)
- 

## 2018-03-15 NOTE — Patient Instructions (Signed)
-  We'll be in touch with lab results -You should hear from rheumatology and to schedule a mammogram.  If you do not hear anything within the next 10-14 days please let me know. -Congratulations on quitting smoking!

## 2018-03-15 NOTE — Assessment & Plan Note (Signed)
Stable, she will continue use of albuterol as needed Congratulated on quitting smoking.

## 2018-03-15 NOTE — Progress Notes (Signed)
Frances Nelson - 62 y.o. female MRN [id number]  Date of birth: 09-10-1955  Subjective Chief Complaint  Patient presents with  . Follow-up    hypertension    HPI Frances Nelson is a 62 y.o. female with history of +ANA/SLE, COPD/Asthma overlap, and elevated blood pressure here today for a follow up visit.   -Asthma/COPD:  History of Asthma/COPD using albuterol as needed.  She quit smoking in May 2019 with Chantix.  She denies increased wheezing, shortness of breath, or cough.    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  Seen by Dr. Gerilyn Nestle in the past.  Was referred at previous appt back to rheumatology however never received outside records and referral was closed.  Since that time she has had continued tendonitis of the L achilles and is now having knee pain.  She was rx voltaren gel however this was too expensive.  She is unable to utilize oral NSAIDs due to prior GI bleed on celebrex.  She has taken steroids in the past with only mild improvement.  She also has rash on upper extremities and trunk that has been itchy.  Denies changes to laundry detergents, soaps, medications or foods.    -Elevated BP:  History of elevated BP however with acute pain during previous visits.  BP is much better controlled today. She has not had chest pain, shortness of breath, palpitations, headache or vision change.   Wants to have updated TDAP and cologuard She missed appt for mammogram and needs new referral.   ROS:  A comprehensive ROS was completed and negative except as noted per HPI  Allergies  Allergen Reactions  . Symbicort [Budesonide-Formoterol Fumarate] Other (See Comments)    Patient reported dizziness, nausea, headaches and sore throat while using Symbicort 160. Reported on 09/03/17  . Celebrex [Celecoxib]     Bleeding.    . Chocolate Flavor   . Ciprofloxacin Nausea And Vomiting    Headache, shaking.   . Eggs Or  Egg-Derived Products   . Flavoring Agent     Unknown  . Gabapentin   . Lyrica [Pregabalin]     "Made me out of it."   . Tramadol   . Wellbutrin [Bupropion]   . Codeine Palpitations    Past Medical History:  Diagnosis Date  . Asthma     Past Surgical History:  Procedure Laterality Date  . ABDOMINAL HYSTERECTOMY    . APPENDECTOMY    . TONSILLECTOMY      Social History   Socioeconomic History  . Marital status: Divorced    Spouse name: Not on file  . Number of children: Not on file  . Years of education: Not on file  . Highest education level: Not on file  Occupational History  . Not on file  Social Needs  . Financial resource strain: Not on file  . Food insecurity:    Worry: Not on file    Inability: Not on file  . Transportation needs:    Medical: Not on file    Non-medical: Not on file  Tobacco Use  . Smoking status: Former Smoker    Packs/day: 0.75    Years: 40.00    Pack years: 30.00    Types: Cigarettes    Last attempt to quit: 11/14/2017    Years since quitting: 0.3  . Smokeless tobacco: Never Used  Substance and Sexual Activity  . Alcohol use: No  . Drug use: Never  .  Sexual activity: Not on file  Lifestyle  . Physical activity:    Days per week: Not on file    Minutes per session: Not on file  . Stress: Not on file  Relationships  . Social connections:    Talks on phone: Not on file    Gets together: Not on file    Attends religious service: Not on file    Active member of club or organization: Not on file    Attends meetings of clubs or organizations: Not on file    Relationship status: Not on file  Other Topics Concern  . Not on file  Social History Narrative  . Not on file    Family History  Problem Relation Age of Onset  . Leukemia Sister   . Brain cancer Brother        glioblastoma   . Diabetes Sister     Health Maintenance  Topic Date Due  . Hepatitis C Screening  02/11/1956  . TETANUS/TDAP  11/21/1974  . PAP SMEAR   11/20/1976  . MAMMOGRAM  11/20/2005  . COLONOSCOPY  11/20/2005  . INFLUENZA VACCINE  02/14/2018  . HIV Screening  11/14/2018 (Originally 11/21/1970)    ----------------------------------------------------------------------------------------------------------------------------------------------------------------------------------------------------------------- Physical Exam BP 124/80 (BP Location: Right Arm, Patient Position: Sitting, Cuff Size: Large)   Pulse 82   Wt 211 lb 12.8 oz (96.1 kg)   BMI 37.52 kg/m   Physical Exam  Constitutional: She is oriented to person, place, and time. She appears well-nourished. No distress.  HENT:  Head: Normocephalic and atraumatic.  Mouth/Throat: Oropharynx is clear and moist.  Eyes: No scleral icterus.  Neck: Neck supple.  Cardiovascular: Normal rate, regular rhythm and normal heart sounds.  Pulmonary/Chest: Effort normal and breath sounds normal.  Musculoskeletal:  Mild swelling over L knee cap with mild TTP.  Popliteal fullness on L consistent with bakers cyst.   L achilles is ttp with haglund deformity  Neurological: She is alert and oriented to person, place, and time. No cranial nerve deficit.  Skin: Skin is warm and dry. Rash (diffuse papular rash on upper extremities and trunk. ) noted.  Psychiatric: She has a normal mood and affect. Her behavior is normal.    ------------------------------------------------------------------------------------------------------------------------------------------------------------------------------------------------------------------- Assessment and Plan  COPD with asthma ([place]) Stable, she will continue use of albuterol as needed Congratulated on quitting smoking.   Synovitis and tenosynovitis Continue swelling of achilles, now with knee joint involvement Has bakers cyst and what appears to be pre-patellar bursitis.  Referral placed to return to rheumatology, updated labs ordered.    Need for  hepatitis C screening test Hep c antibody ordered.   Screening for breast cancer Screening mammogram ordered  Screening for colon cancer Cologuard ordered  Elevated blood pressure reading without diagnosis of hypertension BP is well controlled today, will monitor  Rash Possibly related to autoimmune disorder vs contact derm Rx for mometasone

## 2018-03-15 NOTE — Assessment & Plan Note (Signed)
Possibly related to autoimmune disorder vs contact derm Rx for mometasone

## 2018-03-15 NOTE — Assessment & Plan Note (Signed)
Cologuard ordered

## 2018-03-15 NOTE — Assessment & Plan Note (Signed)
BP is well controlled today, will monitor

## 2018-03-20 LAB — HEPATITIS C ANTIBODY
Hepatitis C Ab: NONREACTIVE
SIGNAL TO CUT-OFF: 0.01 (ref <1.00)

## 2018-03-20 LAB — ANA,IFA RA DIAG PNL W/RFLX TIT/PATN
Anti Nuclear Antibody(ANA): NEGATIVE
Cyclic Citrullin Peptide Ab: <16 UNITS
Rhuematoid fact SerPl-aCnc: <14 IU/mL (ref <14)

## 2018-03-22 NOTE — Progress Notes (Signed)
Office Visit Note  Patient: Frances Nelson             Date of Birth: December 28, 1955           MRN: [id number]             PCP: Luetta Nutting, DO Referring: Luetta Nutting, DO Visit Date: 03/29/2018 Occupation: Glass blower/designer currently unemployed  Subjective:  Pain in multiple joints.   History of Present Illness: Frances Nelson is a 62 y.o. female seen in consultation per request of her PCP.  According to patient her symptoms started in 2013 with increased pain in her joints and all over her body.  In 2015 her PCP did some labs and her ANA was positive.  At the time she was also experiencing pain in multiple joints which involved her shoulders, hands, lower back, hips, knees, ankles and feet.  She recalls having swelling on her left knee joint, left ankle and her left foot.  She was evaluated by Dr. Leonie Green who evaluated her and advised anti-inflammatories.  She states she dealt with pain over many years.  She continues to have a lot of discomfort in her bilateral feet.  Her left foot hurts more than the right foot.  She has noticed a spur in the back of her foot and also on the top of her left foot.  She has left knee joint discomfort.  She is also noticed a rash off and on.  She gives history of infrequent oral ulcers.  She has disc disease of cervical spine which causes pain into her shoulders.  Activities of Daily Living:  Patient reports morning stiffness for 30 minutes.   Patient Reports nocturnal pain.  Difficulty dressing/grooming: Denies Difficulty climbing stairs: Reports Difficulty getting out of chair: Reports Difficulty using hands for taps, buttons, cutlery, and/or writing: Reports  Review of Systems  Constitutional: Positive for fatigue. Negative for night sweats, weight gain and weight loss.  HENT: Positive for mouth sores. Negative for trouble swallowing, trouble swallowing, mouth dryness and nose dryness.   Eyes: Positive for dryness. Negative for pain, redness and visual  disturbance.  Respiratory: Negative for cough, shortness of breath and difficulty breathing.   Cardiovascular: Negative for chest pain, palpitations, hypertension, irregular heartbeat and swelling in legs/feet.  Gastrointestinal: Negative for blood in stool, constipation and diarrhea.  Endocrine: Negative for increased urination.  Genitourinary: Negative for vaginal dryness.  Musculoskeletal: Positive for arthralgias, joint pain, joint swelling, myalgias, morning stiffness and myalgias. Negative for muscle weakness and muscle tenderness.  Skin: Positive for rash. Negative for color change, hair loss, skin tightness, ulcers and sensitivity to sunlight.  Allergic/Immunologic: Negative for susceptible to infections.  Neurological: Negative for dizziness, memory loss, night sweats and weakness.  Hematological: Negative for swollen glands.  Psychiatric/Behavioral: Positive for sleep disturbance. Negative for depressed mood. The patient is not nervous/anxious.     PMFS History:  Patient Active Problem List   Diagnosis Date Noted  . Screening for breast cancer 03/15/2018  . Need for hepatitis C screening test 03/15/2018  . Positive ANA (antinuclear antibody) 03/15/2018  . Synovitis and tenosynovitis 03/15/2018  . Screening for colon cancer 03/15/2018  . Rash 03/15/2018  . Achilles tendon pain 11/13/2017  . History of systemic lupus erythematosus (SLE) ([place]) 11/13/2017  . Cervical myelopathy with cervical radiculopathy 11/13/2017  . Elevated blood pressure reading without diagnosis of hypertension 11/13/2017  . COPD with asthma ([place]) 06/19/2017  . Allergic rhinitis 06/19/2017    Past Medical History:  Diagnosis  Date  . Asthma     Family History  Problem Relation Age of Onset  . Leukemia Sister   . Brain cancer Brother        glioblastoma   . Asthma Son   . Allergies Son    Past Surgical History:  Procedure Laterality Date  . ABDOMINAL HYSTERECTOMY    . APPENDECTOMY    .  TONSILLECTOMY     Social History   Social History Narrative  . Not on file    Objective: Vital Signs: BP 130/86 (BP Location: Right Arm, Patient Position: Sitting, Cuff Size: Normal)   Pulse 82   Resp 14   Ht 5' 3.5" (1.613 m)   Wt 211 lb 12.8 oz (96.1 kg)   BMI 36.93 kg/m    Physical Exam  Constitutional: She is oriented to person, place, and time. She appears well-developed and well-nourished.  HENT:  Head: Normocephalic and atraumatic.  Eyes: Conjunctivae and EOM are normal.  Neck: Normal range of motion.  Cardiovascular: Normal rate, regular rhythm, normal heart sounds and intact distal pulses.  Pulmonary/Chest: Effort normal and breath sounds normal.  Abdominal: Soft. Bowel sounds are normal.  Lymphadenopathy:    She has no cervical adenopathy.  Neurological: She is alert and oriented to person, place, and time.  Skin: Skin is warm and dry. Capillary refill takes less than 2 seconds.  Psychiatric: She has a normal mood and affect. Her behavior is normal.  Nursing note and vitals reviewed.    Musculoskeletal Exam: She has limited range of motion of cervical spine with discomfort.  She had discomfort range of motion of bilateral shoulder joints.  All the shoulder joints were in full range of motion.  She has hyperextension of bilateral elbows.  She has short fourth metacarpal bilaterally.  Though wrist joint MCPs PIPs DIPs swelling was noted.  She had good range of motion of her hip joints with some tenderness over left trochanteric bursa.  Knee joints were in full range of motion without any warmth swelling or effusion.  She had discomfort range of motion of her knee joints.  She has tenderness on palpation of bilateral ankle joints.  No warmth swelling or effusion was noted.  Posterior calcaneal spurs were noted.  Left foot dorsal spur was noted.  Bilateral first MTP thickening was noted but no synovitis was noted.  CDAI Exam: CDAI Score: Not documented Patient Global  Assessment: Not documented; Provider Global Assessment: Not documented Swollen: Not documented; Tender: Not documented Joint Exam   Not documented   There is currently no information documented on the homunculus. Go to the Rheumatology activity and complete the homunculus joint exam.  Investigation: No additional findings. Component     Latest Ref Rng & Units 03/15/2018  Anti Nuclear Antibody(ANA)     NEGATIVE NEGATIVE  RA Latex Turbid.     <16 IU/mL <10  Cyclic Citrullin Peptide Ab     UNITS <16  Interpretation        TSH     0.35 - 4.50 uIU/mL 2.47  Sed Rate     0 - 30 mm/hr 19   Imaging: Xr Cervical Spine 2 Or 3 Views  Result Date: 03/29/2018 No disc space narrowing was noted.  No facet joint arthropathy was noted.  Xr Foot 2 Views Left  Result Date: 03/29/2018 Inferior and posterior calcaneal spurs were noted.  Dorsal spurring was noted.  Mild PIP and DIP narrowing was noted.  No MTP narrowing or erosive changes were  noted.  No intertarsal joint space narrowing was noted. Impression: These findings are consistent with osteoarthritis of the foot.  Xr Foot 2 Views Right  Result Date: 03/29/2018 PIP and DIP narrowing was noted.  No intertarsal joint space narrowing was noted.  Inferior and posterior calcaneal spurs were noted. Impression: These findings are consistent with osteoarthritis of the foot.  Xr Hand 2 View Left  Result Date: 03/29/2018 No MCP narrowing was noted.  Minimal PIP and DIP narrowing was noted.  She has short fourth metacarpal.  No erosive changes were noted. Impression: These findings are consistent with short fourth metacarpal.  Mild osteoarthritic changes were noted.  Xr Hand 2 View Right  Result Date: 03/29/2018 No MCP narrowing was noted.  Minimal PIP and DIP narrowing was noted.  She has short fourth metacarpal.  No erosive changes were noted. Impression: These findings are consistent with short fourth metacarpal.  Mild osteoarthritic changes were  noted.  Xr Knee 3 View Left  Result Date: 03/29/2018 Mild medial compartment narrowing was noted.  No chondrocalcinosis was noted.  Mild patellofemoral narrowing was noted. 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  Xr Knee 3 View Right  Result Date: 03/29/2018 Mild medial compartment narrowing was noted.  No chondrocalcinosis was noted.  Mild patellofemoral narrowing was noted. 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   Recent Labs: Lab Results  Component Value Date   WBC 7.3 03/15/2018   HGB 14.4 03/15/2018   PLT 347.0 03/15/2018   NA 139 03/15/2018   K 4.6 03/15/2018   CL 100 03/15/2018   CO2 32 03/15/2018   GLUCOSE 110 (H) 03/15/2018   BUN 14 03/15/2018   CREATININE 0.84 03/15/2018   CALCIUM 10.1 03/15/2018  Hep C negative  Speciality Comments: No specialty comments available.  Procedures:  No procedures performed Allergies: Symbicort [budesonide-formoterol fumarate]; Celebrex [celecoxib]; Chocolate flavor; Ciprofloxacin; Eggs or egg-derived products; Flavoring agent; Gabapentin; Lyrica [pregabalin]; Tramadol; Wellbutrin [bupropion]; and Codeine   Assessment / Plan:     Visit Diagnoses: Positive ANA (antinuclear antibody) -patient reports that she had positive ANA x4 in the past but her most recent ANA was negative.  She complains of arthralgias and fatigue.  She also complains of some rash.  We discussed obtaining AV[facility] labs.  I do not see any synovitis on examination today.  Referral placed for +ANA, ANA was negative on 8/30/19RF<14, CCP<16, TSH 2.47, sed rate 19  Pain in both hands -she complains of pain in bilateral hands but no synovitis was noted.  Plan: XR Hand 2 View Right, XR Hand 2 View Left x-ray showed mild osteoarthritic changes and bilateral short fourth metacarpal.  Chronic pain of both knees -she complains of chronic  pain in her bilateral knee joints especially her left knee joint.  No warmth swelling or effusion was noted.  Plan: XR KNEE 3 VIEW RIGHT, XR KNEE 3 VIEW LEFT.  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  I have given her a handout on knee exercises.  Of natural anti-inflammatories was given.  Pain in both feet -she has pain in her bilateral feet.  She has bilateral posterior calcaneal spur and left dorsal spur.  I do not see any synovitis.  Plan: XR Foot 2 Views Right, XR Foot 2 Views Left.  The x-ray of the feet were consistent with osteoarthritis and calcaneal spurs.  She is having discomfort from  posterior calcaneal spur in her left foot.  She modification was discussed.  She can also see a podiatrist.  Cervical myelopathy with cervical radiculopathy -complains of chronic cervical pain in the pain radiating into her shoulders and head.  She states she had MRI scheduled of her C-spine but she could not get it.  Plan: XR Cervical Spine 2 or 3 views her cervical spine x-ray was unremarkable.  I have given her C-spine exercises.  Rash-mild erythematous macular rash was noted on appropriate extremities.  COPD with asthma ([place])  History of gastroesophageal reflux (GERD)  Former smoker - Quit smoking May 2019.  Smoked three-quarter pack per day for 35 years.   Orders: Orders Placed This Encounter  Procedures  . XR Cervical Spine 2 or 3 views  . XR Hand 2 View Right  . XR Hand 2 View Left  . XR KNEE 3 VIEW RIGHT  . XR KNEE 3 VIEW LEFT  . XR Foot 2 Views Right  . XR Foot 2 Views Left   No orders of the defined types were placed in this encounter.   Face-to-face time spent with patient was 50 minutes. Greater than 50% of time was spent in counseling and coordination of care.  Follow-Up Instructions: Return for POlyarthralgia.   Bo Merino, MD  Note - This record has been created using Editor, commissioning.  Chart creation errors have  been sought, but may not always  have been located. Such creation errors do not reflect on  the standard of medical care.

## 2018-03-25 NOTE — Progress Notes (Signed)
Lab work looks ok.   ANA is negative this time Would keep appt with rheumatology

## 2018-03-26 NOTE — Progress Notes (Signed)
He said with orthotics here that he bills as an office visit and charges just for materials for orthotics.  He said insurance will typically cover this for her diagnosis.  If insurance doesn't cover out of pocket cost is about $190-200

## 2018-03-28 ENCOUNTER — Other Ambulatory Visit: Payer: Self-pay | Admitting: Family Medicine

## 2018-03-28 DIAGNOSIS — M7660 Achilles tendinitis, unspecified leg: Secondary | ICD-10-CM

## 2018-03-28 NOTE — Progress Notes (Signed)
Spoke with patient regarding labs and all questions answered.

## 2018-03-29 ENCOUNTER — Ambulatory Visit (INDEPENDENT_AMBULATORY_CARE_PROVIDER_SITE_OTHER): Payer: Self-pay

## 2018-03-29 ENCOUNTER — Encounter: Payer: Self-pay | Admitting: Rheumatology

## 2018-03-29 ENCOUNTER — Other Ambulatory Visit: Payer: Self-pay | Admitting: Family Medicine

## 2018-03-29 ENCOUNTER — Ambulatory Visit: Payer: BLUE CROSS/BLUE SHIELD | Admitting: Rheumatology

## 2018-03-29 VITALS — BP 130/86 | HR 82 | Resp 14 | Ht 63.5 in | Wt 211.8 lb

## 2018-03-29 DIAGNOSIS — G959 Disease of spinal cord, unspecified: Secondary | ICD-10-CM

## 2018-03-29 DIAGNOSIS — R768 Other specified abnormal immunological findings in serum: Secondary | ICD-10-CM

## 2018-03-29 DIAGNOSIS — M79641 Pain in right hand: Secondary | ICD-10-CM

## 2018-03-29 DIAGNOSIS — M79672 Pain in left foot: Secondary | ICD-10-CM | POA: Diagnosis not present

## 2018-03-29 DIAGNOSIS — Z87891 Personal history of nicotine dependence: Secondary | ICD-10-CM

## 2018-03-29 DIAGNOSIS — M25562 Pain in left knee: Secondary | ICD-10-CM

## 2018-03-29 DIAGNOSIS — R21 Rash and other nonspecific skin eruption: Secondary | ICD-10-CM

## 2018-03-29 DIAGNOSIS — M25561 Pain in right knee: Secondary | ICD-10-CM | POA: Diagnosis not present

## 2018-03-29 DIAGNOSIS — M5412 Radiculopathy, cervical region: Secondary | ICD-10-CM

## 2018-03-29 DIAGNOSIS — Z8719 Personal history of other diseases of the digestive system: Secondary | ICD-10-CM

## 2018-03-29 DIAGNOSIS — J449 Chronic obstructive pulmonary disease, unspecified: Secondary | ICD-10-CM

## 2018-03-29 DIAGNOSIS — M79642 Pain in left hand: Secondary | ICD-10-CM

## 2018-03-29 DIAGNOSIS — M79671 Pain in right foot: Secondary | ICD-10-CM | POA: Diagnosis not present

## 2018-03-29 DIAGNOSIS — M4712 Other spondylosis with myelopathy, cervical region: Secondary | ICD-10-CM

## 2018-03-29 DIAGNOSIS — G8929 Other chronic pain: Secondary | ICD-10-CM

## 2018-03-29 NOTE — Patient Instructions (Addendum)
Knee Exercises Ask your health care provider which exercises are safe for you. Do exercises exactly as told by your health care provider and adjust them as directed. It is normal to feel mild stretching, pulling, tightness, or discomfort as you do these exercises, but you should stop right away if you feel sudden pain or your pain gets worse.Do not begin these exercises until told by your health care provider. STRETCHING AND RANGE OF MOTION EXERCISES These exercises warm up your muscles and joints and improve the movement and flexibility of your knee. These exercises also help to relieve pain, numbness, and tingling. Exercise A: Knee Extension, Prone 1. Lie on your abdomen on a bed. 2. Place your left / right knee just beyond the edge of the surface so your knee is not on the bed. You can put a towel under your left / right thigh just above your knee for comfort. 3. Relax your leg muscles and allow gravity to straighten your knee. You should feel a stretch behind your left / right knee. 4. Hold this position for __________ seconds. 5. Scoot up so your knee is supported between repetitions. Repeat __________ times. Complete this stretch __________ times a day. Exercise B: Knee Flexion, Active  1. Lie on your back with both knees straight. If this causes back discomfort, bend your left / right knee so your foot is flat on the floor. 2. Slowly slide your left / right heel back toward your buttocks until you feel a gentle stretch in the front of your knee or thigh. 3. Hold this position for __________ seconds. 4. Slowly slide your left / right heel back to the starting position. Repeat __________ times. Complete this exercise __________ times a day. Exercise C: Quadriceps, Prone  1. Lie on your abdomen on a firm surface, such as a bed or padded floor. 2. Bend your left / right knee and hold your ankle. If you cannot reach your ankle or pant leg, loop a belt around your foot and grab the belt  instead. 3. Gently pull your heel toward your buttocks. Your knee should not slide out to the side. You should feel a stretch in the front of your thigh and knee. 4. Hold this position for __________ seconds. Repeat __________ times. Complete this stretch __________ times a day. Exercise D: Hamstring, Supine 1. Lie on your back. 2. Loop a belt or towel over the ball of your left / right foot. The ball of your foot is on the walking surface, right under your toes. 3. Straighten your left / right knee and slowly pull on the belt to raise your leg until you feel a gentle stretch behind your knee. ? Do not let your left / right knee bend while you do this. ? Keep your other leg flat on the floor. 4. Hold this position for __________ seconds. Repeat __________ times. Complete this stretch __________ times a day. STRENGTHENING EXERCISES These exercises build strength and endurance in your knee. Endurance is the ability to use your muscles for a long time, even after they get tired. Exercise E: Quadriceps, Isometric  1. Lie on your back with your left / right leg extended and your other knee bent. Put a rolled towel or small pillow under your knee if told by your health care provider. 2. Slowly tense the muscles in the front of your left / right thigh. You should see your kneecap slide up toward your hip or see increased dimpling just above the knee. This   motion will push the back of the knee toward the floor. 3. For __________ seconds, keep the muscle as tight as you can without increasing your pain. 4. Relax the muscles slowly and completely. Repeat __________ times. Complete this exercise __________ times a day. Exercise F: Straight Leg Raises - Quadriceps 1. Lie on your back with your left / right leg extended and your other knee bent. 2. Tense the muscles in the front of your left / right thigh. You should see your kneecap slide up or see increased dimpling just above the knee. Your thigh may  even shake a bit. 3. Keep these muscles tight as you raise your leg 4-6 inches (10-15 cm) off the floor. Do not let your knee bend. 4. Hold this position for __________ seconds. 5. Keep these muscles tense as you lower your leg. 6. Relax your muscles slowly and completely after each repetition. Repeat __________ times. Complete this exercise __________ times a day. Exercise G: Hamstring, Isometric 1. Lie on your back on a firm surface. 2. Bend your left / right knee approximately __________ degrees. 3. Dig your left / right heel into the surface as if you are trying to pull it toward your buttocks. Tighten the muscles in the back of your thighs to dig as hard as you can without increasing any pain. 4. Hold this position for __________ seconds. 5. Release the tension gradually and allow your muscles to relax completely for __________ seconds after each repetition. Repeat __________ times. Complete this exercise __________ times a day. Exercise H: Hamstring Curls  If told by your health care provider, do this exercise while wearing ankle weights. Begin with __________ weights. Then increase the weight by 1 lb (0.5 kg) increments. Do not wear ankle weights that are more than __________. 1. Lie on your abdomen with your legs straight. 2. Bend your left / right knee as far as you can without feeling pain. Keep your hips flat against the floor. 3. Hold this position for __________ seconds. 4. Slowly lower your leg to the starting position.  Repeat __________ times. Complete this exercise __________ times a day. Exercise I: Squats (Quadriceps) 1. Stand in front of a table, with your feet and knees pointing straight ahead. You may rest your hands on the table for balance but not for support. 2. Slowly bend your knees and lower your hips like you are going to sit in a chair. ? Keep your weight over your heels, not over your toes. ? Keep your lower legs upright so they are parallel with the table  legs. ? Do not let your hips go lower than your knees. ? Do not bend lower than told by your health care provider. ? If your knee pain increases, do not bend as low. 3. Hold the squat position for __________ seconds. 4. Slowly push with your legs to return to standing. Do not use your hands to pull yourself to standing. Repeat __________ times. Complete this exercise __________ times a day. Exercise J: Wall Slides (Quadriceps)  1. Lean your back against a smooth wall or door while you walk your feet out 18-24 inches (46-61 cm) from it. 2. Place your feet hip-width apart. 3. Slowly slide down the wall or door until your knees bend __________ degrees. Keep your knees over your heels, not over your toes. Keep your knees in line with your hips. 4. Hold for __________ seconds. Repeat __________ times. Complete this exercise __________ times a day. Exercise K: Straight Leg Raises -   Hip Abductors 1. Lie on your side with your left / right leg in the top position. Lie so your head, shoulder, knee, and hip line up. You may bend your bottom knee to help you keep your balance. 2. Roll your hips slightly forward so your hips are stacked directly over each other and your left / right knee is facing forward. 3. Leading with your heel, lift your top leg 4-6 inches (10-15 cm). You should feel the muscles in your outer hip lifting. ? Do not let your foot drift forward. ? Do not let your knee roll toward the ceiling. 4. Hold this position for __________ seconds. 5. Slowly return your leg to the starting position. 6. Let your muscles relax completely after each repetition. Repeat __________ times. Complete this exercise __________ times a day. Exercise L: Straight Leg Raises - Hip Extensors 1. Lie on your abdomen on a firm surface. You can put a pillow under your hips if that is more comfortable. 2. Tense the muscles in your buttocks and lift your left / right leg about 4-6 inches (10-15 cm). Keep your knee  straight as you lift your leg. 3. Hold this position for __________ seconds. 4. Slowly lower your leg to the starting position. 5. Let your leg relax completely after each repetition. Repeat __________ times. Complete this exercise __________ times a day. This information is not intended to replace advice given to you by your health care provider. Make sure you discuss any questions you have with your health care provider. Document Released: 05/17/2005 Document Revised: 03/27/2016 Document Reviewed: 05/09/2015 Elsevier Interactive Patient Education  2018 [employer]. Cervical Strain and Sprain Rehab Ask your health care provider which exercises are safe for you. Do exercises exactly as told by your health care provider and adjust them as directed. It is normal to feel mild stretching, pulling, tightness, or discomfort as you do these exercises, but you should stop right away if you feel sudden pain or your pain gets worse.Do not begin these exercises until told by your health care provider. Stretching and range of motion exercises These exercises warm up your muscles and joints and improve the movement and flexibility of your neck. These exercises also help to relieve pain, numbness, and tingling. Exercise A: Cervical side bend  1. Using good posture, sit on a stable chair or stand up. 2. Without moving your shoulders, slowly tilt your left / right ear to your shoulder until you feel a stretch in your neck muscles. You should be looking straight ahead. 3. Hold for __________ seconds. 4. Repeat with the other side of your neck. Repeat __________ times. Complete this exercise __________ times a day. Exercise B: Cervical rotation  1. Using good posture, sit on a stable chair or stand up. 2. Slowly turn your head to the side as if you are looking over your left / right shoulder. ? Keep your eyes level with the ground. ? Stop when you feel a stretch along the side and the back of your  neck. 3. Hold for __________ seconds. 4. Repeat this by turning to your other side. Repeat __________ times. Complete this exercise __________ times a day. Exercise C: Thoracic extension and pectoral stretch 1. Roll a towel or a small blanket so it is about 4 inches (10 cm) in diameter. 2. Lie down on your back on a firm surface. 3. Put the towel lengthwise, under your spine in the middle of your back. It should not be not under your shoulder  blades. The towel should line up with your spine from your middle back to your lower back. 4. Put your hands behind your head and let your elbows fall out to your sides. 5. Hold for __________ seconds. Repeat __________ times. Complete this exercise __________ times a day. Strengthening exercises These exercises build strength and endurance in your neck. Endurance is the ability to use your muscles for a long time, even after your muscles get tired. Exercise D: Upper cervical flexion, isometric 1. Lie on your back with a thin pillow behind your head and a small rolled-up towel under your neck. 2. Gently tuck your chin toward your chest and nod your head down to look toward your feet. Do not lift your head off the pillow. 3. Hold for __________ seconds. 4. Release the tension slowly. Relax your neck muscles completely before you repeat this exercise. Repeat __________ times. Complete this exercise __________ times a day. Exercise E: Cervical extension, isometric  1. Stand about 6 inches (15 cm) away from a wall, with your back facing the wall. 2. Place a soft object, about 6-8 inches (15-20 cm) in diameter, between the back of your head and the wall. A soft object could be a small pillow, a ball, or a folded towel. 3. Gently tilt your head back and press into the soft object. Keep your jaw and forehead relaxed. 4. Hold for __________ seconds. 5. Release the tension slowly. Relax your neck muscles completely before you repeat this exercise. Repeat  __________ times. Complete this exercise __________ times a day. Posture and body mechanics  Body mechanics refers to the movements and positions of your body while you do your daily activities. Posture is part of body mechanics. Good posture and healthy body mechanics can help to relieve stress in your body's tissues and joints. Good posture means that your spine is in its natural S-curve position (your spine is neutral), your shoulders are pulled back slightly, and your head is not tipped forward. The following are general guidelines for applying improved posture and body mechanics to your everyday activities. Standing  When standing, keep your spine neutral and keep your feet about hip-width apart. Keep a slight bend in your knees. Your ears, shoulders, and hips should line up.  When you do a task in which you stand in one place for a long time, place one foot up on a stable object that is 2-4 inches (5-10 cm) high, such as a footstool. This helps keep your spine neutral. Sitting   When sitting, keep your spine neutral and your keep feet flat on the floor. Use a footrest, if necessary, and keep your thighs parallel to the floor. Avoid rounding your shoulders, and avoid tilting your head forward.  When working at a desk or a computer, keep your desk at a height where your hands are slightly lower than your elbows. Slide your chair under your desk so you are close enough to maintain good posture.  When working at a computer, place your monitor at a height where you are looking straight ahead and you do not have to tilt your head forward or downward to look at the screen. Resting When lying down and resting, avoid positions that are most painful for you. Try to support your neck in a neutral position. You can use a contour pillow or a small rolled-up towel. Your pillow should support your neck but not push on it. This information is not intended to replace advice given to you by your   health care  provider. Make sure you discuss any questions you have with your health care provider. Document Released: 07/03/2005 Document Revised: 03/09/2016 Document Reviewed: 06/09/2015 Elsevier Interactive Patient Education  2018 [employer].  Natural anti-inflammatories  You can purchase these at [employer], [employer] or online.  . Turmeric (capsules)  . Ginger (ginger root or capsules)  . Omega 3 (Fish, flax seeds, chia seeds, walnuts, almonds)  . Tart cherry (dried or extract or tablet)   Patient should be under the care of a physician while taking these supplements. This may not be reproduced without the permission of Dr. Bo Merino.

## 2018-04-02 ENCOUNTER — Ambulatory Visit (INDEPENDENT_AMBULATORY_CARE_PROVIDER_SITE_OTHER): Payer: BLUE CROSS/BLUE SHIELD

## 2018-04-02 ENCOUNTER — Ambulatory Visit: Payer: BLUE CROSS/BLUE SHIELD | Admitting: Family Medicine

## 2018-04-02 ENCOUNTER — Encounter: Payer: Self-pay | Admitting: Family Medicine

## 2018-04-02 VITALS — BP 128/76 | HR 76 | Ht 63.5 in

## 2018-04-02 DIAGNOSIS — R21 Rash and other nonspecific skin eruption: Secondary | ICD-10-CM | POA: Diagnosis not present

## 2018-04-02 DIAGNOSIS — M7660 Achilles tendinitis, unspecified leg: Secondary | ICD-10-CM | POA: Diagnosis not present

## 2018-04-02 DIAGNOSIS — R768 Other specified abnormal immunological findings in serum: Secondary | ICD-10-CM | POA: Diagnosis not present

## 2018-04-02 MED ORDER — DICLOFENAC SODIUM 2 % TD SOLN: 1.0000 "application " | Freq: Two times a day (BID) | TRANSDERMAL | 3 refills | Status: DC

## 2018-04-02 MED ORDER — NAPROXEN-ESOMEPRAZOLE 500-20 MG PO TBEC: 1.0000 | DELAYED_RELEASE_TABLET | Freq: Two times a day (BID) | ORAL | 3 refills | Status: DC

## 2018-04-02 NOTE — Patient Instructions (Addendum)
Nice to meet you  Please try the boot  Please pennsaid  Please take the vimovo for 10 days and then as needed.   Add a heel lift to your shoes and boot  Please see me back in 2-3 weeks.

## 2018-04-02 NOTE — Assessment & Plan Note (Signed)
Significant uptake at the insertion and calcaneal spurring. - place in CAM walker  - pennsaid  - vimovo  - will place in CAM walker for 2 weeks and follow up. Will try to transition to heel lifts

## 2018-04-02 NOTE — Progress Notes (Signed)
Frances Nelson - 62 y.o. female MRN [id number]  Date of birth: [birth year]  SUBJECTIVE:  Including CC & ROS.  Chief Complaint  Patient presents with  . Left fooot pain    Frances Nelson is a 62 y.o. female that is presenting with left foot pain. Pain is chronic in nature, increasing over the past six months. She has seen a podiatrist and a rheumatologist.  Pain is located at there dorsal midfoot and at the base of the Achilles. Pain is constant, worse with dorsiflexion and flexion. Bearing weight on her foot exacerbates the pain. Noticeable swelling present on her heel, difficulty wearing shoes. She has been taking Robaxin and Zanaflex with some improvement. She has been using OTC topical analgesics.   She recalls her horse stepping on her foot more than ten years ago- she was placed in an air cast.  Independent review of the left foot x-ray left foot x-rays from 9/13 show a calcaneal spur on the posterior aspect.  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   Review of Systems  Constitutional: Negative for fever.  HENT: Negative for congestion.   Respiratory: Negative for cough.   Cardiovascular: Negative for chest pain.  Gastrointestinal: Negative for abdominal pain.  Musculoskeletal: Positive for gait problem.  Skin: Negative for color change.  Neurological: Negative for weakness.  Hematological: Negative for adenopathy.  Psychiatric/Behavioral: Negative for agitation.    HISTORY: Past Medical, Surgical, Social, and Family History Reviewed & Updated per EMR.   Pertinent Historical Findings include:  Past Medical History:  Diagnosis Date  . Asthma     Past Surgical History:  Procedure Laterality Date  . ABDOMINAL HYSTERECTOMY    . APPENDECTOMY    . TONSILLECTOMY      Allergies  Allergen Reactions  . Symbicort [Budesonide-Formoterol Fumarate] Other (See Comments)    Patient reported dizziness, nausea, headaches and sore throat while using  Symbicort 160. Reported on 09/03/17  . Celebrex [Celecoxib]     Bleeding.    . Chocolate Flavor   . Ciprofloxacin Nausea And Vomiting    Headache, shaking.   . Eggs Or Egg-Derived Products   . Flavoring Agent     Unknown  . Gabapentin   . Lyrica [Pregabalin]     "Made me out of it."   . Tramadol   . Wellbutrin [Bupropion]   . Codeine Palpitations    Family History  Problem Relation Age of Onset  . Leukemia Sister   . Brain cancer Brother        glioblastoma   . Asthma Son   . Allergies Son      Social History   Socioeconomic History  . Marital status: Divorced    Spouse name: Not on file  . Number of children: Not on file  . Years of education: Not on file  . Highest education level: Not on file  Occupational History  . Not on file  Social Needs  . Financial resource strain: Not on file  . Food insecurity:    Worry: Not on file    Inability: Not on file  . Transportation needs:    Medical: Not on file    Non-medical: Not on file  Tobacco Use  . Smoking status: Former Smoker    Packs/day: 0.75    Years: 40.00    Pack years: 30.00    Types: Cigarettes    Last attempt to quit: 11/14/2017    Years since quitting: 0.3  . Smokeless tobacco: Never  Used  Substance and Sexual Activity  . Alcohol use: No  . Drug use: Never  . Sexual activity: Not on file  Lifestyle  . Physical activity:    Days per week: Not on file    Minutes per session: Not on file  . Stress: Not on file  Relationships  . Social connections:    Talks on phone: Not on file    Gets together: Not on file    Attends religious service: Not on file    Active member of club or organization: Not on file    Attends meetings of clubs or organizations: Not on file    Relationship status: Not on file  . Intimate partner violence:    Fear of current or ex partner: Not on file    Emotionally abused: Not on file    Physically abused: Not on file    Forced sexual activity: Not on file  Other Topics  Concern  . Not on file  Social History Narrative  . Not on file     PHYSICAL EXAM:  VS: BP 128/76   Pulse 76   Ht 5' 3.5" (1.613 m)   SpO2 99%   BMI 36.93 kg/m  Physical Exam Gen: NAD, alert, cooperative with exam, well-appearing ENT: normal lips, normal nasal mucosa,  Eye: normal EOM, normal conjunctiva and lids CV:  no edema, +2 pedal pulses   Resp: no accessory muscle use, non-labored,  Skin: no rashes, no areas of induration  Neuro: normal tone, normal sensation to touch Psych:  normal insight, alert and oriented MSK:  Left foot: Haglund deformity present. Tenderness palpation over this deformity. Normal ankle range of motion. Normal strength resistance. Has midfoot bossing Pain with resistance to inversion eversion. Neurovascular intact  Limited ultrasound: Left Achilles/foot:  Thickening of the Achilles to suggest tendinopathy.  Also increased Doppler flow in this area. Calcaneal spurring was evident at the insertion  Summary: Calcaneal spurring as well as significant inflammatory changes at the base and 2 cm proximally.  Ultrasound and interpretation by Clearance Coots, MD      ASSESSMENT & PLAN:   Achilles tendinitis Significant uptake at the insertion and calcaneal spurring. - place in CAM walker  - pennsaid  - vimovo  - will place in CAM walker for 2 weeks and follow up. Will try to transition to heel lifts

## 2018-04-10 DIAGNOSIS — M170 Bilateral primary osteoarthritis of knee: Secondary | ICD-10-CM | POA: Insufficient documentation

## 2018-04-10 DIAGNOSIS — M19072 Primary osteoarthritis, left ankle and foot: Secondary | ICD-10-CM | POA: Insufficient documentation

## 2018-04-10 DIAGNOSIS — M19041 Primary osteoarthritis, right hand: Secondary | ICD-10-CM | POA: Insufficient documentation

## 2018-04-10 DIAGNOSIS — M19042 Primary osteoarthritis, left hand: Secondary | ICD-10-CM

## 2018-04-10 DIAGNOSIS — M19071 Primary osteoarthritis, right ankle and foot: Secondary | ICD-10-CM | POA: Insufficient documentation

## 2018-04-10 NOTE — Progress Notes (Signed)
Office Visit Note  Patient: Frances Nelson             Date of Birth: [birth year]           MRN: [id number]             PCP: Luetta Nutting, DO Referring: Luetta Nutting, DO Visit Date: 04/24/2018 Occupation: '@GUAROCC' @  Subjective:  Pain in multiple joints.   History of Present Illness: Frances Nelson is a 63 y.o. female with history of positive ANA and join pain.  She continues to have pain and discomfort in her shoulders, hands, knees and her feet.  She states she went for hiking in [place] where she passed out on a [place].  She states her blood saturation of oxygen was about 70.  She also had elevated blood pressure and tachycardia.  She gradually recovered from that.  She was also diagnosed with left Achilles tendinitis by orthopedic surgeon and she is in a brace currently.  The rash she had initial visit has resolved.  She states she did not get to see a dermatologist.   Activities of Daily Living:  Patient reports morning stiffness for 30-45 minutes.   Patient Reports nocturnal pain.  Difficulty dressing/grooming: Reports Difficulty climbing stairs: Reports Difficulty getting out of chair: Reports Difficulty using hands for taps, buttons, cutlery, and/or writing: Reports  Review of Systems  Constitutional: Negative for fatigue.  HENT: Positive for mouth sores. Negative for trouble swallowing, trouble swallowing and mouth dryness.   Eyes: Positive for dryness. Negative for pain.  Respiratory: Negative for shortness of breath, wheezing and difficulty breathing.   Cardiovascular: Negative for chest pain, palpitations and swelling in legs/feet.  Gastrointestinal: Negative for abdominal pain, constipation, diarrhea, nausea and vomiting.  Endocrine: Negative for increased urination.  Genitourinary: Negative for painful urination, nocturia and pelvic pain.  Musculoskeletal: Positive for arthralgias, joint pain and morning stiffness. Negative for joint swelling.  Skin:  Negative for rash and hair loss.  Allergic/Immunologic: Negative for susceptible to infections.  Neurological: Positive for memory loss. Negative for loss of consciousness, headaches and weakness.  Hematological: Negative for bruising/bleeding tendency.  Psychiatric/Behavioral: Negative for confusion. The patient is not nervous/anxious.     PMFS History:  Patient Active Problem List   Diagnosis Date Noted  . Primary osteoarthritis of both hands 04/10/2018  . Primary osteoarthritis of both knees 04/10/2018  . Primary osteoarthritis of both feet 04/10/2018  . Achilles tendinitis 04/02/2018  . Screening for breast cancer 03/15/2018  . Need for hepatitis C screening test 03/15/2018  . Positive ANA (antinuclear antibody) 03/15/2018  . Synovitis and tenosynovitis 03/15/2018  . Screening for colon cancer 03/15/2018  . Rash 03/15/2018  . Achilles tendon pain 11/13/2017  . History of systemic lupus erythematosus (SLE) ([place]) 11/13/2017  . Cervical myelopathy with cervical radiculopathy 11/13/2017  . Elevated blood pressure reading without diagnosis of hypertension 11/13/2017  . COPD with asthma ([place]) 06/19/2017  . Allergic rhinitis 06/19/2017    Past Medical History:  Diagnosis Date  . Asthma     Family History  Problem Relation Age of Onset  . Leukemia Sister   . Brain cancer Brother        glioblastoma   . Asthma Son   . Allergies Son    Past Surgical History:  Procedure Laterality Date  . ABDOMINAL HYSTERECTOMY    . APPENDECTOMY    . TONSILLECTOMY     Social History   Social History Narrative  . Not on file  Objective: Vital Signs: BP (!) 149/92 (BP Location: Left Arm, Patient Position: Sitting, Cuff Size: Normal)   Pulse 81   Resp 15   Ht 5' 3.5" (1.613 m)   Wt 212 lb (96.2 kg)   BMI 36.97 kg/m    Physical Exam  Constitutional: She is oriented to person, place, and time. She appears well-developed and well-nourished.  HENT:  Head: Normocephalic and  atraumatic.  Eyes: Conjunctivae and EOM are normal.  Neck: Normal range of motion.  Cardiovascular: Normal rate, regular rhythm, normal heart sounds and intact distal pulses.  Pulmonary/Chest: Effort normal and breath sounds normal.  Abdominal: Soft. Bowel sounds are normal.  Lymphadenopathy:    She has no cervical adenopathy.  Neurological: She is alert and oriented to person, place, and time.  Skin: Skin is warm and dry. Capillary refill takes 2 to 3 seconds. Rash noted.  Erythematous papular rash was noted on her upper extremities.  She has petechial rash on her right foot.  Psychiatric: She has a normal mood and affect. Her behavior is normal.  Nursing note and vitals reviewed.    Musculoskeletal Exam: C-spine thoracic lumbar spine good range of motion.  She has some discomfort range of motion of her lumbar spine.  Shoulder joints were in good range of motion with discomfort.  Elbow joints and wrist joints were in good range of motion.  She has DIP and PIP thickening in her hands.  She is crepitus in her knee joints without any warmth swelling or effusion.  She has osteoarthritic changes in her right foot.  Left foot was in a brace.  CDAI Exam: CDAI Score: Not documented Patient Global Assessment: Not documented; Provider Global Assessment: Not documented Swollen: Not documented; Tender: Not documented Joint Exam   Not documented   There is currently no information documented on the homunculus. Go to the Rheumatology activity and complete the homunculus joint exam.  Investigation: Findings:  April 02, 2018 ANA 1: 320 homogeneous, ENA negative, CB CAP negative, anticardiolipin negative, anti-beta-2 GP 1-, RF negative, anti-CCP negative, anti-carbamylated protein positive antithyroglobulin negative, antithyroid peroxidase negative    8/30/19RF<14, CCP<16, TSH 2.47, sed rate 19    Imaging: Xr Cervical Spine 2 Or 3 Views  Result Date: 03/29/2018 No disc space narrowing was  noted.  No facet joint arthropathy was noted.  Xr Foot 2 Views Left  Result Date: 03/29/2018 Inferior and posterior calcaneal spurs were noted.  Dorsal spurring was noted.  Mild PIP and DIP narrowing was noted.  No MTP narrowing or erosive changes were noted.  No intertarsal joint space narrowing was noted. Impression: These findings are consistent with osteoarthritis of the foot.  Xr Foot 2 Views Right  Result Date: 03/29/2018 PIP and DIP narrowing was noted.  No intertarsal joint space narrowing was noted.  Inferior and posterior calcaneal spurs were noted. Impression: These findings are consistent with osteoarthritis of the foot.  Xr Hand 2 View Left  Result Date: 03/29/2018 No MCP narrowing was noted.  Minimal PIP and DIP narrowing was noted.  She has short fourth metacarpal.  No erosive changes were noted. Impression: These findings are consistent with short fourth metacarpal.  Mild osteoarthritic changes were noted.  Xr Hand 2 View Right  Result Date: 03/29/2018 No MCP narrowing was noted.  Minimal PIP and DIP narrowing was noted.  She has short fourth metacarpal.  No erosive changes were noted. Impression: These findings are consistent with short fourth metacarpal.  Mild osteoarthritic changes were noted.  Xr  Knee 3 View Left  Result Date: 03/29/2018 Mild medial compartment narrowing was noted.  No chondrocalcinosis was noted.  Mild patellofemoral narrowing was noted. 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  Xr Knee 3 View Right  Result Date: 03/29/2018 Mild medial compartment narrowing was noted.  No chondrocalcinosis was noted.  Mild patellofemoral narrowing was noted. 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   Recent Labs: Lab Results  Component Value Date   WBC 7.3 03/15/2018   HGB 14.4 03/15/2018   PLT 347.0 03/15/2018   NA 139  03/15/2018   K 4.6 03/15/2018   CL 100 03/15/2018   CO2 32 03/15/2018   GLUCOSE 110 (H) 03/15/2018   BUN 14 03/15/2018   CREATININE 0.84 03/15/2018   CALCIUM 10.1 03/15/2018    Speciality Comments: No specialty comments available.  Procedures:  No procedures performed Allergies: Symbicort [budesonide-formoterol fumarate]; Celebrex [celecoxib]; Chocolate flavor; Ciprofloxacin; Eggs or egg-derived products; Flavoring agent; Gabapentin; Lyrica [pregabalin]; Tramadol; Wellbutrin [bupropion]; and Codeine   Assessment / Plan:     Visit Diagnoses: Positive ANA (antinuclear antibody) - AVISE- LUPUS SCORE WAS NEGATIVE.  1: 320 homogeneous, ENA negative, CB CAP negative, anticardiolipin negative, anti-beta-2 GP 1-, RF negative, anti-CCP negative, anti-carbamylated protein positive antithyroglobulin negative, antithyroid peroxidase negative.  Primary osteoarthritis of both hands - Mild, bilateral short fourth metacarpal.  She continues to have some discomfort in her bilateral hands.  No synovitis was noted.  Primary osteoarthritis of both knees - Mild with moderate chondromalacia patella she has chronic discomfort in her knee joints.  She gives history of intermittent swelling.  I do not see any synovitis.- Plan: Angiotensin converting enzyme  Primary osteoarthritis of both feet - Mild.  She has only mild osteoarthritis in her feet but she has significant discomfort in her bilateral feet.  She denies any joint swelling.  Achilles tendinitis of left lower extremity -diagnosed recently by her orthopedic surgeon.  She is in a brace currently.  Plan: HLA-B27 antigen  Cervical myelopathy with cervical radiculopathy-she has chronic pain.  Rash-she has some papular rash in her upper extremities with excoriation marks.  She has followed petechial rash in her lower extremities.  I will refer to dermatology.  Raynaud's syndrome without gangrene -she gives history of rainouts.  I did not see any Raynolds in  the office today.  She has mottling of the skin in her lower extremities and upper extremities.  Capillary refill was between 2 to 3 seconds.  I will refer her to vein and vascular for lower extremity vascular studies.  Plan: Pan-ANCA, Cryoglobulin, Hepatitis B core antibody, IgM, Hepatitis B surface antigen, Serum protein electrophoresis with reflex, Lupus Anticoagulant Eval w/Reflex   COPD with asthma ([place])  History of gastroesophageal reflux (GERD)  Former smoker - Quit smoking May 2019.  Smoked three-quarter pack per day for 35 years.   Orders: Orders Placed This Encounter  Procedures  . Angiotensin converting enzyme  . HLA-B27 antigen  . Pan-ANCA  . Cryoglobulin  . Hepatitis B core antibody, IgM  . Hepatitis B surface antigen  . Serum protein electrophoresis with reflex  . Lupus Anticoagulant Eval w/Reflex  . Ambulatory referral to Dermatology  . Ambulatory referral to Vascular Surgery   No orders of the defined types were placed in this encounter.   Face-to-face time spent with patient was 30 minutes. Greater than 50% of time was spent in counseling and coordination of  care.  Follow-Up Instructions: Return in about 2 months (around 06/24/2018), or if symptoms worsen or fail to improve, for Osteoarthritis, +ANA, Rash.   Bo Merino, MD  Note - This record has been created using Editor, commissioning.  Chart creation errors have been sought, but may not always  have been located. Such creation errors do not reflect on  the standard of medical care.

## 2018-04-17 ENCOUNTER — Ambulatory Visit: Payer: BLUE CROSS/BLUE SHIELD | Admitting: Family Medicine

## 2018-04-17 DIAGNOSIS — M7662 Achilles tendinitis, left leg: Secondary | ICD-10-CM | POA: Diagnosis not present

## 2018-04-17 MED ORDER — DICLOFENAC SODIUM 2 % TD SOLN: 1.0000 "application " | Freq: Two times a day (BID) | TRANSDERMAL | 3 refills | Status: DC

## 2018-04-17 MED ORDER — PREDNISONE 5 MG PO TABS: ORAL_TABLET | ORAL | 0 refills | Status: DC

## 2018-04-17 NOTE — Assessment & Plan Note (Signed)
Had some improvement with her pain but still has significant pain outside of the boot. -Continue the cam walker for 2 more weeks. -Provided Pennsaid. -She will try the Pennsaid and if does not have any improvement prednisone was sent in -We will try to get out of the boot at 2 week follow-up.  Could consider sending to physical therapy at that time.

## 2018-04-17 NOTE — Patient Instructions (Signed)
Good to see you  Please stay in the boot if you're at work or out and about  Please take the boot off at home and try the range of motion exercises Please try the rub on medicine  Please see me back in 2-3 weeks.

## 2018-04-17 NOTE — Progress Notes (Signed)
Frances Nelson - 62 y.o. female MRN [id number]  Date of birth: March 19, 1956  SUBJECTIVE:  Including CC & ROS.  Chief Complaint  Patient presents with  . Follow-up    Frances Nelson is a 62 y.o. female that is here today for left achilles tendinitis follow up. She reports her pain has improved since last visit. Pain is worse when she walking on the left lateral aspect of her ankle. She has been wearing CAM walker daily. She did try to walk without the CAM walker on pain was mild to severe.  Had some improvement of her pain with the V[place].  She just returned from [place], reports an instance of lightheadedness while at [place]. Her blood pressure was elevated. Returned to normal after a period of sitting down, denies needing medical attention.      Review of Systems  Constitutional: Negative for fever.  HENT: Negative for congestion.   Respiratory: Negative for cough.   Cardiovascular: Negative for chest pain.  Gastrointestinal: Negative for abdominal pain.  Musculoskeletal: Positive for gait problem.  Skin: Negative for color change.  Neurological: Negative for weakness.  Hematological: Negative for adenopathy.  Psychiatric/Behavioral: Negative for agitation.    HISTORY: Past Medical, Surgical, Social, and Family History Reviewed & Updated per EMR.   Pertinent Historical Findings include:  Past Medical History:  Diagnosis Date  . Asthma     Past Surgical History:  Procedure Laterality Date  . ABDOMINAL HYSTERECTOMY    . APPENDECTOMY    . TONSILLECTOMY      Allergies  Allergen Reactions  . Symbicort [Budesonide-Formoterol Fumarate] Other (See Comments)    Patient reported dizziness, nausea, headaches and sore throat while using Symbicort 160. Reported on 09/03/17  . Celebrex [Celecoxib]     Bleeding.    . Chocolate Flavor   . Ciprofloxacin Nausea And Vomiting    Headache, shaking.   . Eggs Or Egg-Derived Products   . Flavoring Agent     Unknown  . Gabapentin   .  Lyrica [Pregabalin]     "Made me out of it."   . Tramadol   . Wellbutrin [Bupropion]   . Codeine Palpitations    Family History  Problem Relation Age of Onset  . Leukemia Sister   . Brain cancer Brother        glioblastoma   . Asthma Son   . Allergies Son      Social History   Socioeconomic History  . Marital status: Divorced    Spouse name: Not on file  . Number of children: Not on file  . Years of education: Not on file  . Highest education level: Not on file  Occupational History  . Not on file  Social Needs  . Financial resource strain: Not on file  . Food insecurity:    Worry: Not on file    Inability: Not on file  . Transportation needs:    Medical: Not on file    Non-medical: Not on file  Tobacco Use  . Smoking status: Former Smoker    Packs/day: 0.75    Years: 40.00    Pack years: 30.00    Types: Cigarettes    Last attempt to quit: 11/14/2017    Years since quitting: 0.4  . Smokeless tobacco: Never Used  Substance and Sexual Activity  . Alcohol use: No  . Drug use: Never  . Sexual activity: Not on file  Lifestyle  . Physical activity:    Days per week: Not on file  Minutes per session: Not on file  . Stress: Not on file  Relationships  . Social connections:    Talks on phone: Not on file    Gets together: Not on file    Attends religious service: Not on file    Active member of club or organization: Not on file    Attends meetings of clubs or organizations: Not on file    Relationship status: Not on file  . Intimate partner violence:    Fear of current or ex partner: Not on file    Emotionally abused: Not on file    Physically abused: Not on file    Forced sexual activity: Not on file  Other Topics Concern  . Not on file  Social History Narrative  . Not on file     PHYSICAL EXAM:  VS: BP 140/82   Pulse 78   Ht 5' 3.5" (1.613 m)   SpO2 98%   BMI 36.93 kg/m  Physical Exam Gen: NAD, alert, cooperative with exam,  well-appearing ENT: normal lips, normal nasal mucosa,  Eye: normal EOM, normal conjunctiva and lids CV:  no edema, +2 pedal pulses   Resp: no accessory muscle use, non-labored,  Skin: no rashes, no areas of induration  Neuro: normal tone, normal sensation to touch Psych:  normal insight, alert and oriented MSK:  Left ankle: No significant swelling of the Achilles today. Tenderness palpation mildly at the mid belly. Normal range of motion. Some tenderness palpation of the peroneal tendons on the lateral aspect of the malleolus. Neurovascularly intact     ASSESSMENT & PLAN:   Achilles tendinitis Had some improvement with her pain but still has significant pain outside of the boot. -Continue the cam walker for 2 more weeks. -Provided Pennsaid. -She will try the Pennsaid and if does not have any improvement prednisone was sent in -We will try to get out of the boot at 2 week follow-up.  Could consider sending to physical therapy at that time.

## 2018-04-24 ENCOUNTER — Ambulatory Visit: Payer: BLUE CROSS/BLUE SHIELD | Admitting: Rheumatology

## 2018-04-24 ENCOUNTER — Encounter: Payer: Self-pay | Admitting: Rheumatology

## 2018-04-24 VITALS — BP 149/92 | HR 81 | Resp 15 | Ht 63.5 in | Wt 212.0 lb

## 2018-04-24 DIAGNOSIS — M170 Bilateral primary osteoarthritis of knee: Secondary | ICD-10-CM | POA: Diagnosis not present

## 2018-04-24 DIAGNOSIS — R21 Rash and other nonspecific skin eruption: Secondary | ICD-10-CM

## 2018-04-24 DIAGNOSIS — R768 Other specified abnormal immunological findings in serum: Secondary | ICD-10-CM | POA: Diagnosis not present

## 2018-04-24 DIAGNOSIS — M19071 Primary osteoarthritis, right ankle and foot: Secondary | ICD-10-CM

## 2018-04-24 DIAGNOSIS — M7662 Achilles tendinitis, left leg: Secondary | ICD-10-CM

## 2018-04-24 DIAGNOSIS — M4712 Other spondylosis with myelopathy, cervical region: Secondary | ICD-10-CM

## 2018-04-24 DIAGNOSIS — I7300 Raynaud's syndrome without gangrene: Secondary | ICD-10-CM | POA: Diagnosis not present

## 2018-04-24 DIAGNOSIS — M5412 Radiculopathy, cervical region: Secondary | ICD-10-CM

## 2018-04-24 DIAGNOSIS — M19041 Primary osteoarthritis, right hand: Secondary | ICD-10-CM | POA: Diagnosis not present

## 2018-04-24 DIAGNOSIS — R0989 Other specified symptoms and signs involving the circulatory and respiratory systems: Secondary | ICD-10-CM

## 2018-04-24 DIAGNOSIS — M19072 Primary osteoarthritis, left ankle and foot: Secondary | ICD-10-CM

## 2018-04-24 DIAGNOSIS — G959 Disease of spinal cord, unspecified: Secondary | ICD-10-CM

## 2018-04-24 DIAGNOSIS — M19042 Primary osteoarthritis, left hand: Secondary | ICD-10-CM

## 2018-04-26 ENCOUNTER — Telehealth: Payer: Self-pay | Admitting: Rheumatology

## 2018-04-26 ENCOUNTER — Other Ambulatory Visit: Payer: Self-pay | Admitting: Emergency Medicine

## 2018-04-26 NOTE — Telephone Encounter (Signed)
FYI: Margreta Journey called  patient to schedule an appt mid November with her doctor. Per Margreta Journey, patient refused appt stating she was going to be seen with her PCP this coming week, and would discuss getting an appt at that time with him.

## 2018-04-29 DIAGNOSIS — L309 Dermatitis, unspecified: Secondary | ICD-10-CM | POA: Diagnosis not present

## 2018-04-30 ENCOUNTER — Ambulatory Visit: Payer: BLUE CROSS/BLUE SHIELD | Admitting: Family Medicine

## 2018-04-30 ENCOUNTER — Encounter: Payer: Self-pay | Admitting: Family Medicine

## 2018-04-30 VITALS — BP 128/76 | HR 80 | Ht 63.5 in

## 2018-04-30 DIAGNOSIS — M7662 Achilles tendinitis, left leg: Secondary | ICD-10-CM

## 2018-04-30 NOTE — Progress Notes (Signed)
Frances Nelson - 62 y.o. female MRN [id number]  Date of birth: 08/08/1955  SUBJECTIVE:  Including CC & ROS.  Chief Complaint  Patient presents with  . Follow-up    Frances Nelson is a 62 y.o. female that is here today for left achilles tendinitis. She did not complete prednisone as prescribed at her last visit, states she has too many side effects. She has been completing range of motion exercise and applying Pennsaid. She has not been wearing her boot. Her pain has improved, but still painful at times.  She reports about a 30% improvement of her pain.  She still has pain when she walks for too long.  The above documentation has been reviewed and is accurate and complete. Clearance Coots, MD 05/01/2018, 3:15 PM>   Review of Systems  Constitutional: Negative for fever.  HENT: Negative for congestion.   Respiratory: Negative for cough.   Cardiovascular: Negative for chest pain.  Gastrointestinal: Negative for abdominal pain.  Musculoskeletal: Positive for arthralgias and gait problem.  Skin: Negative for color change.  Neurological: Negative for weakness.  Hematological: Negative for adenopathy.  Psychiatric/Behavioral: Negative for agitation.    HISTORY: Past Medical, Surgical, Social, and Family History Reviewed & Updated per EMR.   Pertinent Historical Findings include:  Past Medical History:  Diagnosis Date  . Asthma     Past Surgical History:  Procedure Laterality Date  . ABDOMINAL HYSTERECTOMY    . APPENDECTOMY    . TONSILLECTOMY      Allergies  Allergen Reactions  . Symbicort [Budesonide-Formoterol Fumarate] Other (See Comments)    Patient reported dizziness, nausea, headaches and sore throat while using Symbicort 160. Reported on 09/03/17  . Celebrex [Celecoxib]     Bleeding.    . Chocolate Flavor   . Ciprofloxacin Nausea And Vomiting    Headache, shaking.   . Eggs Or Egg-Derived Products   . Flavoring Agent     Unknown  . Gabapentin   . Lyrica [Pregabalin]       "Made me out of it."   . Tramadol   . Wellbutrin [Bupropion]   . Codeine Palpitations    Family History  Problem Relation Age of Onset  . Leukemia Sister   . Brain cancer Brother        glioblastoma   . Asthma Son   . Allergies Son      Social History   Socioeconomic History  . Marital status: Divorced    Spouse name: Not on file  . Number of children: Not on file  . Years of education: Not on file  . Highest education level: Not on file  Occupational History  . Not on file  Social Needs  . Financial resource strain: Not on file  . Food insecurity:    Worry: Not on file    Inability: Not on file  . Transportation needs:    Medical: Not on file    Non-medical: Not on file  Tobacco Use  . Smoking status: Former Smoker    Packs/day: 0.75    Years: 40.00    Pack years: 30.00    Types: Cigarettes    Last attempt to quit: 11/14/2017    Years since quitting: 0.4  . Smokeless tobacco: Never Used  Substance and Sexual Activity  . Alcohol use: No  . Drug use: Never  . Sexual activity: Not on file  Lifestyle  . Physical activity:    Days per week: Not on file    Minutes per session:  Not on file  . Stress: Not on file  Relationships  . Social connections:    Talks on phone: Not on file    Gets together: Not on file    Attends religious service: Not on file    Active member of club or organization: Not on file    Attends meetings of clubs or organizations: Not on file    Relationship status: Not on file  . Intimate partner violence:    Fear of current or ex partner: Not on file    Emotionally abused: Not on file    Physically abused: Not on file    Forced sexual activity: Not on file  Other Topics Concern  . Not on file  Social History Narrative  . Not on file     PHYSICAL EXAM:  VS: BP 128/76   Pulse 80   Ht 5' 3.5" (1.613 m)   SpO2 98%   BMI 36.97 kg/m  Physical Exam Gen: NAD, alert, cooperative with exam, well-appearing ENT: normal lips, normal  nasal mucosa,  Eye: normal EOM, normal conjunctiva and lids CV:  no edema, +2 pedal pulses   Resp: no accessory muscle use, non-labored,  Skin: no rashes, no areas of induration  Neuro: normal tone, normal sensation to touch Psych:  normal insight, alert and oriented MSK:  Left foot/ankle: Haglund's deformity still present. Mild tenderness to palpation at the base of the calcaneus at the insertion of the Achilles. Normal range of motion. Neurovascular intact     ASSESSMENT & PLAN:   Achilles tendinitis Could consider testing for gout. HLA B27 was normal.  - would obtain uric acid, Anti-CCP and RF  - duexis samples provided  - would re-scan at follow up

## 2018-04-30 NOTE — Patient Instructions (Signed)
Good to see you  Please try the duexis and see how that does. I can send more of this in.  You can try the prednisone if the pain is severe.  Please try using the boot if you have severe pain at the end of the day  Please continue the exercises  Please let me know if you want to try physical therapy  Please follow up with me in 4-6 weeks.

## 2018-04-30 NOTE — Assessment & Plan Note (Signed)
Could consider testing for gout. HLA B27 was normal.  - would obtain uric acid, Anti-CCP and RF  - duexis samples provided  - would re-scan at follow up

## 2018-05-01 ENCOUNTER — Telehealth: Payer: Self-pay | Admitting: Rheumatology

## 2018-05-01 NOTE — Telephone Encounter (Signed)
Patient has declined dermatology referral until she speaks to her PCP.

## 2018-05-01 NOTE — Telephone Encounter (Signed)
FYITye Maryland with [facility] left a message Monday at the [facility] stating they could see patient that day at 3:15pm. They were calling patient to get her in, but wanted to let you know. Tye Maryland did not leave a return phone #.

## 2018-05-02 ENCOUNTER — Encounter: Payer: Self-pay | Admitting: Emergency Medicine

## 2018-05-02 ENCOUNTER — Ambulatory Visit: Payer: BLUE CROSS/BLUE SHIELD | Admitting: Emergency Medicine

## 2018-05-02 ENCOUNTER — Telehealth: Payer: Self-pay | Admitting: *Deleted

## 2018-05-02 DIAGNOSIS — J449 Chronic obstructive pulmonary disease, unspecified: Secondary | ICD-10-CM

## 2018-05-02 DIAGNOSIS — J301 Allergic rhinitis due to pollen: Secondary | ICD-10-CM | POA: Diagnosis not present

## 2018-05-02 DIAGNOSIS — Z87891 Personal history of nicotine dependence: Secondary | ICD-10-CM | POA: Diagnosis not present

## 2018-05-02 DIAGNOSIS — R768 Other specified abnormal immunological findings in serum: Secondary | ICD-10-CM | POA: Diagnosis not present

## 2018-05-02 DIAGNOSIS — R778 Other specified abnormalities of plasma proteins: Secondary | ICD-10-CM

## 2018-05-02 LAB — PROTEIN ELECTROPHORESIS, SERUM, WITH REFLEX
Albumin ELP: 4.7 g/dL (ref 3.8–4.8)
Alpha 1: 0.3 g/dL (ref 0.2–0.3)
Alpha 2: 1 g/dL — ABNORMAL HIGH (ref 0.5–0.9)
Beta 2: 0.4 g/dL (ref 0.2–0.5)
Beta Globulin: 0.5 g/dL (ref 0.4–0.6)
Gamma Globulin: 1 g/dL (ref 0.8–1.7)
Total Protein: 7.9 g/dL (ref 6.1–8.1)

## 2018-05-02 LAB — HEPATITIS B SURFACE ANTIGEN: Hepatitis B Surface Ag: NONREACTIVE

## 2018-05-02 LAB — HEPATITIS B CORE ANTIBODY, IGM: Hep B C IgM: NONREACTIVE

## 2018-05-02 LAB — IFE INTERPRETATION

## 2018-05-02 LAB — LUPUS ANTICOAGULANT EVAL W/ REFLEX
PTT-LA Screen: 40 s (ref <=40)
dRVVT: 38 s (ref <=45)

## 2018-05-02 LAB — CRYOGLOBULIN: Cryoglobulin, Qualitative Analysis: NOT DETECTED

## 2018-05-02 LAB — HLA-B27 ANTIGEN: HLA-B27 Antigen: NEGATIVE

## 2018-05-02 LAB — PAN-ANCA
ANCA Screen: POSITIVE — AB
Atypical P-ANCA titer: 1:160 {titer} — ABNORMAL HIGH
Myeloperoxidase Abs: <1 AI
Serine Protease 3: <1 AI

## 2018-05-02 LAB — ANGIOTENSIN CONVERTING ENZYME: Angiotensin-Converting Enzyme: 46 U/L (ref 9–67)

## 2018-05-02 MED ORDER — ALBUTEROL SULFATE (2.5 MG/3ML) 0.083% IN NEBU: 2.5000 mg | INHALATION_SOLUTION | RESPIRATORY_TRACT | 5 refills | Status: DC | PRN

## 2018-05-02 NOTE — Patient Instructions (Addendum)
We will not start a scheduled inhaled medication at this time.  We may decide to do so at some point in the future. Keep albuterol available to use either 1 nebulizer or 2 puffs if needed for shortness of breath, wheezing, chest tightness.  We will refill your nebulizer solution today. Congratulations on stopping smoking!  This is fantastic. Keep up the good work! No work-up necessary at this time for any pulmonary manifestations of p-ANCA vasculitis.  We may need to do so in the future depending whether her symptoms evolve.  Chest x-ray next time. Continue Zyrtec once daily. Continue your omeprazole and Zantac as you have been taking them. Follow with Dr Lamonte Sakai in 12 months or sooner if you have any problems

## 2018-05-02 NOTE — Assessment & Plan Note (Signed)
Continue Zyrtec once daily 

## 2018-05-02 NOTE — Telephone Encounter (Signed)
-----   Message from Ofilia Neas, PA-C sent at 05/02/2018 10:20 AM EDT ----- Please make referral to hematology for evaluation of abnormal SPEP/IFE.

## 2018-05-02 NOTE — Assessment & Plan Note (Signed)
We will not start a scheduled inhaled medication at this time.  We may decide to do so at some point in the future. Keep albuterol available to use either 1 nebulizer or 2 puffs if needed for shortness of breath, wheezing, chest tightness.  We will refill your nebulizer solution today. Follow with Dr Lamonte Sakai in 12 months or sooner if you have any problems

## 2018-05-02 NOTE — Assessment & Plan Note (Signed)
Congratulations on stopping smoking!  This is fantastic. Keep up the good work!

## 2018-05-02 NOTE — Assessment & Plan Note (Signed)
No work-up necessary at this time for any pulmonary manifestations of p-ANCA vasculitis.  We may need to do so in the future depending whether her symptoms evolve.  Chest x-ray next time.

## 2018-05-02 NOTE — Progress Notes (Signed)
Subjective:    Patient ID: Frances Nelson, female    DOB: 01/01/1956, 62 y.o.   MRN: [id number]  COPD  She complains of cough and shortness of breath. There is no wheezing. Associated symptoms include ear pain, headaches, sneezing and a sore throat. Pertinent negatives include no fever, postnasal drip, rhinorrhea or trouble swallowing. Her past medical history is significant for COPD.   ROV 05/02/18 --62 year old woman who follows up today for her COPD with a positive bronchodilator response. She quit tobacco May 11!  She has chronic cough due to these issues and also allergic rhinitis.  At her last visit we added Rhinocort to Zyrtec at least through the spring.  Is been difficult to get her on a scheduled bronchodilator/ICS due to side effects and also cost.  At her last visit we decided to hold off and just use albuterol. Since last time she has seen Rheum, positive ANA, p-ANCA positive. She had a rash but it hasn't been biopsied (resolved). She believes that her breathing is stable. Minimal cough. She has albuterol and uses about 2x a week. She had trouble recently in [place] when she went to [place] > had altitude sickness.         Review of Systems  Constitutional: Negative for fever and unexpected weight change.  HENT: Positive for congestion, dental problem, ear pain, sneezing and sore throat. Negative for nosebleeds, postnasal drip, rhinorrhea, sinus pressure and trouble swallowing.   Eyes: Negative for redness and itching.  Respiratory: Positive for cough and shortness of breath. Negative for chest tightness and wheezing.   Cardiovascular: Negative for palpitations and leg swelling.  Gastrointestinal: Negative for nausea and vomiting.  Genitourinary: Negative for dysuria.  Musculoskeletal: Negative for joint swelling.  Skin: Negative for rash.  Neurological: Positive for headaches.  Hematological: Does not bruise/bleed easily.  Psychiatric/Behavioral: Negative for dysphoric mood. The  patient is not nervous/anxious.    Past Medical History:  Diagnosis Date  . Asthma      Family History  Problem Relation Age of Onset  . Leukemia Sister   . Brain cancer Brother        glioblastoma   . Asthma Son   . Allergies Son      Social History   Socioeconomic History  . Marital status: Divorced    Spouse name: Not on file  . Number of children: Not on file  . Years of education: Not on file  . Highest education level: Not on file  Occupational History  . Not on file  Social Needs  . Financial resource strain: Not on file  . Food insecurity:    Worry: Not on file    Inability: Not on file  . Transportation needs:    Medical: Not on file    Non-medical: Not on file  Tobacco Use  . Smoking status: Former Smoker    Packs/day: 0.75    Years: 40.00    Pack years: 30.00    Types: Cigarettes    Last attempt to quit: 11/14/2017    Years since quitting: 0.4  . Smokeless tobacco: Never Used  Substance and Sexual Activity  . Alcohol use: No  . Drug use: Never  . Sexual activity: Not on file  Lifestyle  . Physical activity:    Days per week: Not on file    Minutes per session: Not on file  . Stress: Not on file  Relationships  . Social connections:    Talks on phone: Not on file  Gets together: Not on file    Attends religious service: Not on file    Active member of club or organization: Not on file    Attends meetings of clubs or organizations: Not on file    Relationship status: Not on file  . Intimate partner violence:    Fear of current or ex partner: Not on file    Emotionally abused: Not on file    Physically abused: Not on file    Forced sexual activity: Not on file  Other Topics Concern  . Not on file  Social History Narrative  . 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  No [employer]   Allergies  Allergen Reactions  . Symbicort [Budesonide-Formoterol Fumarate] Other (See Comments)    Patient reported dizziness,  nausea, headaches and sore throat while using Symbicort 160. Reported on 09/03/17  . Celebrex [Celecoxib]     Bleeding.    . Chocolate Flavor   . Ciprofloxacin Nausea And Vomiting    Headache, shaking.   . Eggs Or Egg-Derived Products   . Flavoring Agent     Unknown  . Gabapentin   . Lyrica [Pregabalin]     "Made me out of it."   . Tramadol   . Wellbutrin [Bupropion]   . Codeine Palpitations     Outpatient Medications Prior to Visit  Medication Sig Dispense Refill  . albuterol (PROVENTIL HFA;VENTOLIN HFA) 108 (90 Base) MCG/ACT inhaler Inhale 2 puffs into the lungs every 2 (two) hours as needed for wheezing or shortness of breath (cough). 1 Inhaler 3  . Black Pepper-Turmeric (TURMERIC CURCUMIN) 11-998 MG CAPS     . cetirizine (ZYRTEC) 10 MG tablet Take 10 mg by mouth daily.    . CHANTIX 1 MG tablet TAKE 1 TABLET BY MOUTH TWICE A DAY 60 tablet 2  . Diclofenac Sodium (PENNSAID) 2 % SOLN Place 1 application onto the skin 2 (two) times daily. 1 Bottle 3  . methocarbamol (ROBAXIN) 500 MG tablet Take 1 tablet (500 mg total) by mouth 2 (two) times daily. Take during daytime 60 tablet 2  . Multiple Vitamin (MULTIVITAMIN WITH MINERALS) TABS tablet Take 1 tablet by mouth daily.    . Naproxen-Esomeprazole 500-20 MG TBEC Take 1 tablet by mouth 2 (two) times daily. 60 tablet 3  . omeprazole (PRILOSEC) 40 MG capsule Take 40 mg by mouth daily.    . ranitidine (ZANTAC) 150 MG tablet Take 150 mg by mouth daily.    . tizanidine (ZANAFLEX) 6 MG capsule Take 1 capsule (6 mg total) by mouth at bedtime. 30 capsule 2  . albuterol (PROVENTIL) (2.5 MG/3ML) 0.083% nebulizer solution Take 2.5 mg by nebulization every 4 (four) hours as needed for wheezing or shortness of breath.     No facility-administered medications prior to visit.         Objective:   Physical Exam Vitals:   05/02/18 1010  BP: (!) 148/98  Pulse: 85  SpO2: 99%  Weight: 98.9 kg  Height: 5\' 3"  (1.6 m)   Gen: Pleasant, overweight  woman, in no distress,  normal affect  ENT: No lesions,  mouth clear,  oropharynx clear, no postnasal drip  Neck: No JVD, no stridor  Lungs: No use of accessory muscles, distant, clear without rales or rhonchi  Cardiovascular: RRR, heart sounds normal, no murmur or gallops, no peripheral edema  Musculoskeletal: No deformities, no cyanosis or clubbing. Her L foot is in an immobilizer  boot  Neuro: alert, non focal  Skin: Warm, no lesions or rash      Assessment & Plan:  COPD with asthma ([place]) We will not start a scheduled inhaled medication at this time.  We may decide to do so at some point in the future. Keep albuterol available to use either 1 nebulizer or 2 puffs if needed for shortness of breath, wheezing, chest tightness.  We will refill your nebulizer solution today. Follow with Dr Lamonte Sakai in 12 months or sooner if you have any problems  Allergic rhinitis Continue Zyrtec once daily.  Positive ANA (antinuclear antibody) No work-up necessary at this time for any pulmonary manifestations of p-ANCA vasculitis.  We may need to do so in the future depending whether her symptoms evolve.  Chest x-ray next time.  History of tobacco use Congratulations on stopping smoking!  This is fantastic. Keep up the good work!  Baltazar Apo, MD, PhD 05/02/2018, 10:46 AM  [facility] [phone] or if no answer [phone]

## 2018-05-03 ENCOUNTER — Encounter: Payer: Self-pay | Admitting: Family Medicine

## 2018-05-03 ENCOUNTER — Ambulatory Visit: Payer: BLUE CROSS/BLUE SHIELD | Admitting: Family Medicine

## 2018-05-03 VITALS — BP 130/90 | HR 79 | Temp 98.1°F | Ht 63.5 in | Wt 213.2 lb

## 2018-05-03 DIAGNOSIS — F4329 Adjustment disorder with other symptoms: Secondary | ICD-10-CM | POA: Diagnosis not present

## 2018-05-03 DIAGNOSIS — R55 Syncope and collapse: Secondary | ICD-10-CM | POA: Diagnosis not present

## 2018-05-03 DIAGNOSIS — R7689 Other specified abnormal immunological findings in serum: Secondary | ICD-10-CM

## 2018-05-03 DIAGNOSIS — N281 Cyst of kidney, acquired: Secondary | ICD-10-CM

## 2018-05-03 DIAGNOSIS — R768 Other specified abnormal immunological findings in serum: Secondary | ICD-10-CM | POA: Diagnosis not present

## 2018-05-03 LAB — POCT URINALYSIS DIPSTICK
Bilirubin, UA: NEGATIVE
Blood, UA: NEGATIVE
Glucose, UA: NEGATIVE
Ketones, UA: NEGATIVE
Leukocytes, UA: NEGATIVE
Nitrite, UA: NEGATIVE
Protein, UA: NEGATIVE
Spec Grav, UA: 1.015 (ref 1.010–1.025)
Urobilinogen, UA: 0.2 E.U./dL
pH, UA: 6 (ref 5.0–8.0)

## 2018-05-03 NOTE — Assessment & Plan Note (Addendum)
-  Syncope likely related to acute altitude sickness, possibly complicated by her asthma.  -EKG completed today, normal. -Has mild headache, likely related to muscle tension/stress.  Non-focal neuro exam.

## 2018-05-03 NOTE — Assessment & Plan Note (Signed)
-  Several factors including inability to work and mothers recent fall with fracture.   -She is worried about losing insurance at the beginning of the year.  -Planning on trying to apply for disability for ongoing joint pain

## 2018-05-03 NOTE — Assessment & Plan Note (Signed)
-  Hx of +ANA although rheumatology feels the SLE is unlikely based on additional testing.  -Additionally RF and CCP have been negative.  Xray findings have been without signs of inflammatory joint destruction. -Recently had + bands on SPEP, has pending appt with rheumatology.

## 2018-05-03 NOTE — Assessment & Plan Note (Signed)
-  Possible renal cyst noted on prior CT -ultrasound and UA ordered today.

## 2018-05-03 NOTE — Patient Instructions (Addendum)
-  We'll be in touch with lab results -Your ekg looks ok -You'll be contacted for renal ultrasound -I hope your mother gets better soon!

## 2018-05-03 NOTE — Progress Notes (Signed)
Frances Nelson - 62 y.o. female MRN [id number]  Date of birth: 1955/12/26  Subjective Chief Complaint  Patient presents with  . Labs Only    HPI Frances Nelson is a 62 y.o. female with history of asthma here today for follow up of syncopal event.  She also would like to discuss recent labs from rheumatology.  -Syncope:  Reports that she was in [place] last week and passed out while visiting [place].  Felt funny after reaching the [place] had some mild palpitations and then reports passing out.  She reports that O2 sats were in the 70's when park rangers arrived and she was carted down the mountain.  She states that she was out for about 45 minutes.  She states that she refused hospital evaluation and felt ok once she came back around.  She denies any seizure activity.  She does reports intermittent headache.  She denies chest pain, increased shortness of breath, palpitations.    -Stress/Anxiety:  She does endorse more stress this week due to her mother falling and fracturing her pelvis as well as her inability to work due to ongoing joint and muscle pain.  She plans to go to [place] to help care for her mother next week and doesn't think she will be back in this area until the end of November.  She reports that she will likely lose insurance at the beginning of the year due to not working.  Considering applying for disability.   -Joint pain:  History of chronic joint pain and swelling with history of +ANA.  She was recently seen by rheumatology and had additional lab testing done showing low likelihood of SLE.  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  She was seen by dermatology to have a rash biopsied but the rash has resolved.  She has been referred to hematology for this.  She continues to complain of joint pain and stiffness. She also reports having cysts on her kidneys in the past with hematuria.  Reports this was supposed to be followed up previously but nothing was  ordered.   ROS:  A comprehensive ROS was completed and negative except as noted per HPI  Allergies  Allergen Reactions  . Symbicort [Budesonide-Formoterol Fumarate] Other (See Comments)    Patient reported dizziness, nausea, headaches and sore throat while using Symbicort 160. Reported on 09/03/17  . Celebrex [Celecoxib]     Bleeding.    . Chocolate Flavor   . Ciprofloxacin Nausea And Vomiting    Headache, shaking.   . Eggs Or Egg-Derived Products   . Flavoring Agent     Unknown  . Gabapentin   . Lyrica [Pregabalin]     "Made me out of it."   . Tramadol   . Wellbutrin [Bupropion]   . Codeine Palpitations    Past Medical History:  Diagnosis Date  . Asthma     Past Surgical History:  Procedure Laterality Date  . ABDOMINAL HYSTERECTOMY    . APPENDECTOMY    . TONSILLECTOMY      Social History   Socioeconomic History  . Marital status: Divorced    Spouse name: Not on file  . Number of children: Not on file  . Years of education: Not on file  . Highest education level: Not on file  Occupational History  . Not on file  Social Needs  . Financial resource strain: Not on file  . Food insecurity:    Worry: Not on file  Inability: Not on file  . Transportation needs:    Medical: Not on file    Non-medical: Not on file  Tobacco Use  . Smoking status: Former Smoker    Packs/day: 0.75    Years: 40.00    Pack years: 30.00    Types: Cigarettes    Last attempt to quit: 11/14/2017    Years since quitting: 0.4  . Smokeless tobacco: Never Used  Substance and Sexual Activity  . Alcohol use: No  . Drug use: Never  . Sexual activity: Not on file  Lifestyle  . Physical activity:    Days per week: Not on file    Minutes per session: Not on file  . Stress: Not on file  Relationships  . Social connections:    Talks on phone: Not on file    Gets together: Not on file    Attends religious service: Not on file    Active member of club or organization: Not on file     Attends meetings of clubs or organizations: Not on file    Relationship status: Not on file  Other Topics Concern  . Not on file  Social History Narrative  . Not on file    Family History  Problem Relation Age of Onset  . Leukemia Sister   . Brain cancer Brother        glioblastoma   . Asthma Son   . Allergies Son     Health Maintenance  Topic Date Due  . PAP SMEAR  11/20/1976  . MAMMOGRAM  11/20/2005  . COLONOSCOPY  11/20/2005  . INFLUENZA VACCINE  02/14/2018  . HIV Screening  11/14/2018 (Originally 11/21/1970)  . TETANUS/TDAP  03/15/2028  . Hepatitis C Screening  Completed    ----------------------------------------------------------------------------------------------------------------------------------------------------------------------------------------------------------------- Physical Exam BP 130/90   Pulse 79   Temp 98.1 F (36.7 C) (Oral)   Ht 5' 3.5" (1.613 m)   Wt 213 lb 3.2 oz (96.7 kg)   SpO2 98%   BMI 37.17 kg/m   Physical Exam  Constitutional: She is oriented to person, place, and time. She appears well-nourished. No distress.  HENT:  Head: Normocephalic and atraumatic.  Mouth/Throat: Oropharynx is clear and moist.  Eyes: No scleral icterus.  Neck: Neck supple. No thyromegaly present.  Cardiovascular: Normal rate, regular rhythm and normal heart sounds.  Pulmonary/Chest: Effort normal and breath sounds normal.  Lymphadenopathy:    She has no cervical adenopathy.  Neurological: She is alert and oriented to person, place, and time.  Skin: Skin is warm and dry. No rash noted.  Psychiatric: She has a normal mood and affect. Her behavior is normal.    ------------------------------------------------------------------------------------------------------------------------------------------------------------------------------------------------------------------- Assessment and Plan  Syncope -Syncope likely related to acute altitude sickness, possibly  complicated by her asthma.  -EKG completed today, normal. -Has mild headache, likely related to muscle tension/stress.  Non-focal neuro exam.   Renal cyst -Possible renal cyst noted on prior CT -ultrasound and UA ordered today.   Positive ANA (antinuclear antibody) -Hx of +ANA although rheumatology feels the SLE is unlikely based on additional testing.  -Additionally RF and CCP have been negative.  Xray findings have been without signs of inflammatory joint destruction. -Recently had + bands on SPEP, has pending appt with rheumatology.   Stress and adjustment reaction -Several factors including inability to work and mothers recent fall with fracture.   -She is worried about losing insurance at the beginning of the year.  -Planning on trying to apply for disability for ongoing joint  pain

## 2018-05-04 ENCOUNTER — Encounter: Payer: Self-pay | Admitting: Family Medicine

## 2018-05-06 ENCOUNTER — Other Ambulatory Visit: Payer: Self-pay

## 2018-05-06 DIAGNOSIS — M25569 Pain in unspecified knee: Secondary | ICD-10-CM

## 2018-05-06 DIAGNOSIS — I7300 Raynaud's syndrome without gangrene: Secondary | ICD-10-CM

## 2018-05-06 MED ORDER — AMOXICILLIN-POT CLAVULANATE 875-125 MG PO TABS: 1.0000 | ORAL_TABLET | Freq: Two times a day (BID) | ORAL | 0 refills | Status: DC

## 2018-05-06 NOTE — Progress Notes (Signed)
Please get records from the dermatology visit.

## 2018-05-07 ENCOUNTER — Encounter (HOSPITAL_BASED_OUTPATIENT_CLINIC_OR_DEPARTMENT_OTHER): Payer: Self-pay

## 2018-05-07 ENCOUNTER — Ambulatory Visit (HOSPITAL_BASED_OUTPATIENT_CLINIC_OR_DEPARTMENT_OTHER)
Admission: RE | Admit: 2018-05-07 | Discharge: 2018-05-07 | Disposition: A | Discharge status: Home / Self Care | Payer: BLUE CROSS/BLUE SHIELD | Source: Ambulatory Visit | Attending: Family Medicine | Admitting: Family Medicine

## 2018-05-07 DIAGNOSIS — N281 Cyst of kidney, acquired: Secondary | ICD-10-CM | POA: Diagnosis not present

## 2018-05-09 ENCOUNTER — Other Ambulatory Visit: Payer: Self-pay | Admitting: Family Medicine

## 2018-05-10 ENCOUNTER — Ambulatory Visit: Payer: BLUE CROSS/BLUE SHIELD

## 2018-05-10 NOTE — Progress Notes (Signed)
Ultrasound shows cysts in both kidneys.  No tumor or complex cysts noted.  UA without blood.  No further evaluation needed at this time.

## 2018-06-10 ENCOUNTER — Encounter: Payer: Self-pay | Admitting: Hematology

## 2018-06-10 ENCOUNTER — Telehealth: Payer: Self-pay | Admitting: Hematology

## 2018-06-10 NOTE — Telephone Encounter (Signed)
New referral received from Dr. Estanislado Pandy for abnl spep. Pt has been scheduled to see Dr. Irene Limbo on 10/19 at [facility] to arrive 30 minutes early. Letter mailed.

## 2018-06-11 ENCOUNTER — Telehealth: Payer: Self-pay | Admitting: Emergency Medicine

## 2018-06-11 NOTE — Telephone Encounter (Signed)
Spoke with patient regarding completing the cologuard. Patient states that she has been in [place] taking care of her mother and will be back home 12/01 and she will then complete the Cologuard.

## 2018-06-12 NOTE — Progress Notes (Signed)
Office Visit Note  Patient: Frances Nelson             Date of Birth: Nov 24, 1955           MRN: [id number]             PCP: Collene Gobble, MD Referring: Luetta Nutting, DO Visit Date: 06/26/2018 Occupation: @GUAROCC @  Subjective:  Generalized pain   History of Present Illness: Frances Nelson is a 62 y.o. female with history of positive ANA, osteoarthritis, and achilles tendonitis of left foot.  She reports that she is having "bone pain" and myalgias.  She states the pain does not seem to be within her joints but is more so generalized.  She states that she is having pain in her hands feet and knees.  She she is also having trochanter bursitis bilaterally.  She is chronic lower back pain.  She denies any joint swelling at this time.  She denies any recent rashes.  She reports that she does have sores in her mouth and nose intermittently.  She continues to have chronic eye dryness.  She denies any swollen lymph nodes, she has not had any recent hair loss.  She denies any symptoms of Raynolds.  She reports that she has left Achilles tendinitis and has been taking ibuprofen for pain relief and will be starting physical therapy in 07/02/2018.  She has very interrupted sleep at night due to the pain she experiences.  Activities of Daily Living:  Patient reports morning stiffness all day.   Patient Reports nocturnal pain.  Difficulty dressing/grooming: Denies Difficulty climbing stairs: Reports Difficulty getting out of chair: Reports Difficulty using hands for taps, buttons, cutlery, and/or writing: Reports  Review of Systems  Constitutional: Positive for fatigue.  HENT: Negative for mouth sores, trouble swallowing, trouble swallowing, mouth dryness and nose dryness.   Eyes: Positive for dryness. Negative for pain, redness, itching and visual disturbance.  Respiratory: Negative for cough, hemoptysis, shortness of breath, wheezing and difficulty breathing.   Cardiovascular: Negative for chest  pain, palpitations, hypertension and swelling in legs/feet.  Gastrointestinal: Positive for diarrhea. Negative for abdominal pain, blood in stool and constipation.  Endocrine: Negative for increased urination.  Genitourinary: Negative for painful urination, nocturia and pelvic pain.  Musculoskeletal: Positive for arthralgias, joint pain, joint swelling and morning stiffness. Negative for myalgias, muscle weakness, muscle tenderness and myalgias.  Skin: Negative for color change, pallor, rash, hair loss, nodules/bumps, skin tightness, ulcers and sensitivity to sunlight.  Allergic/Immunologic: Negative for susceptible to infections.  Neurological: Positive for headaches. Negative for light-headedness, numbness, memory loss and weakness.  Hematological: Negative for swollen glands.  Psychiatric/Behavioral: Negative for depressed mood, confusion and sleep disturbance. The patient is not nervous/anxious.     PMFS History:  Patient Active Problem List   Diagnosis Date Noted  . Renal cyst 05/03/2018  . Syncope 05/03/2018  . Stress and adjustment reaction 05/03/2018  . History of tobacco use 05/02/2018  . Primary osteoarthritis of both hands 04/10/2018  . Primary osteoarthritis of both knees 04/10/2018  . Primary osteoarthritis of both feet 04/10/2018  . Achilles tendinitis 04/02/2018  . Screening for breast cancer 03/15/2018  . Positive ANA (antinuclear antibody) 03/15/2018  . Screening for colon cancer 03/15/2018  . Rash 03/15/2018  . Achilles tendon pain 11/13/2017  . History of systemic lupus erythematosus (SLE) ([place]) 11/13/2017  . Cervical myelopathy with cervical radiculopathy 11/13/2017  . Elevated blood pressure reading without diagnosis of hypertension 11/13/2017  . COPD with asthma ([place])  06/19/2017  . Allergic rhinitis 06/19/2017    Past Medical History:  Diagnosis Date  . Asthma     Family History  Problem Relation Age of Onset  . Leukemia Sister   . Brain cancer Brother          glioblastoma   . Asthma Son   . Allergies Son    Past Surgical History:  Procedure Laterality Date  . ABDOMINAL HYSTERECTOMY    . APPENDECTOMY    . HERNIA REPAIR     Umbilical x 3  . TONSILLECTOMY     Social History   Social History Narrative  . Not on file    Objective: Vital Signs: BP (!) 148/85 (BP Location: Left Arm, Patient Position: Sitting, Cuff Size: Normal)   Pulse 75   Resp 14   Ht 5' 3.5" (1.613 m)   Wt 206 lb 9.6 oz (93.7 kg)   BMI 36.02 kg/m    Physical Exam  Constitutional: She is oriented to person, place, and time. She appears well-developed and well-nourished.  HENT:  Head: Normocephalic and atraumatic.  Eyes: Conjunctivae and EOM are normal.  Neck: Normal range of motion.  Cardiovascular: Normal rate, regular rhythm, normal heart sounds and intact distal pulses.  Pulmonary/Chest: Effort normal and breath sounds normal.  Abdominal: Soft. Bowel sounds are normal.  Lymphadenopathy:    She has no cervical adenopathy.  Neurological: She is alert and oriented to person, place, and time.  Skin: Skin is warm and dry. Capillary refill takes less than 2 seconds.  Nail pitting noted  Psychiatric: She has a normal mood and affect. Her behavior is normal.  Nursing note and vitals reviewed.    Musculoskeletal Exam: Generalized hyperalgesia. C-spine limited ROM. Thoracic and lumbar spine good ROM. No midline spinal tenderness.  Right SI joint tenderness.  Shoulder joint abduction to 120 degrees bilaterally with discomfort.  Elbow joints, wrist joints, MCPs, PIPs, and DIPs good ROM with no synovitis. Hip joints, knee joints, ankle joints, MTPs, PIPs, and DIPs good ROM with no synovitis. Left patellar tendonitis.  No warmth or effusion of knee joints.  No tenderness or swelling of ankle joints.  Left achilles tendonitis.  Tenderness of bilateral trochanteric bursitis.    CDAI Exam: CDAI Score: Not documented Patient Global Assessment: Not documented; Provider  Global Assessment: Not documented Swollen: 0 ; Tender: 5  Joint Exam      Right  Left  MCP 2   Tender     PIP 2      Tender  PIP 3   Tender     PIP 4   Tender     Sacroiliac   Tender        Investigation: No additional findings.  Imaging: Dg Ankle Complete Left  Result Date: 06/18/2018 CLINICAL DATA:  62 year old female. Achilles tendonitis. No known injury. Initial encounter. EXAM: LEFT ANKLE COMPLETE - 3+ VIEW COMPARISON:  09/21/2012. FINDINGS: Marked thickening of the distal Achilles tendon at the level of insertion into the calcaneus where there is associated calcifications/bony fragmentation, spur and possible erosion. Moderate-size plantar spur. Degenerative changes dorsal cuneiform/metatarsal base articulation. Mild pes planus. Ankle mortise intact. IMPRESSION: 1. Marked thickening of the distal Achilles tendon at the level of insertion into the calcaneus where there is associated calcifications/bony fragmentation, spur and possible erosion. 2. Moderate-size plantar spur. Electronically Signed   By: Genia Del M.D.   On: 06/18/2018 12:06   Vas [place] Burnard Bunting With/wo Tbi  Result Date: 06/17/2018 LOWER EXTREMITY DOPPLER STUDY  Indications: Hands and feet get numb and bluish and painful.  Performing Technologist: Ralene Cork RVT  Examination Guidelines: A complete evaluation includes at minimum, Doppler waveform signals and systolic blood pressure reading at the level of bilateral brachial, anterior tibial, and posterior tibial arteries, when vessel segments are accessible. Bilateral testing is considered an integral part of a complete examination. Photoelectric Plethysmograph (PPG) waveforms and toe systolic pressure readings are included as required and additional duplex testing as needed. Limited examinations for reoccurring indications may be performed as noted.  ABI Findings: TOES Findings: +----------+---------------+--------+--------------------------------+ Right ToesPressure  (mmHg)WaveformComment                          +----------+---------------+--------+--------------------------------+ 1st Digit                Normal                                   +----------+---------------+--------+--------------------------------+ 1st Digit 170            Normal  after ice water compress on toes +----------+---------------+--------+--------------------------------+ +---------+---------------+--------+--------------------------------+ Left ToesPressure (mmHg)WaveformComment                          +---------+---------------+--------+--------------------------------+ 1st Digit               Normal                                   +---------+---------------+--------+--------------------------------+ 1st [id number]            Normal  after ice water compress on toes +---------+---------------+--------+--------------------------------+   Summary: Right: Great toe waveform and pressure remain normal after ice water compression of toes. Left: Great toe waveform and pressure remain normal after ice water compression of toes.  *See table(s) above for measurements and observations.  Electronically signed by Harold Barban MD on 06/17/2018 at 2:12:43 PM.   Final    Vas Ue Doppler Bilat/comp Tos, Digits (to&ue Reynauds)  Result Date: 06/17/2018 UPPER EXTREMITY DOPPLER STUDY Indications: Reynauds. History:     Arm numbness.  Performing Technologist: Burley Saver RVT  Examination Guidelines: A complete evaluation includes B-mode imaging, spectral Doppler, color Doppler, and power Doppler as needed of all accessible portions of each vessel. Bilateral testing is considered an integral part of a complete examination. Limited examinations for reoccurring indications may be performed as noted.  Technologist Notes: Right: Digital PPG tracings obtained appear appropriately pulsatile. Left: Digital PPG tracings obtained appear appropriately pulsatile.  Summary:  Right: Normal waveforms  noted pre and post warming of digits. Left: Normal waveforms noted pre and post warming of digits. *See table(s) above for measurements and observations. Electronically signed by Harold Barban MD on 06/17/2018 at 3:54:52 PM.    Final     Recent Labs: Lab Results  Component Value Date   WBC 7.3 03/15/2018   HGB 14.4 03/15/2018   PLT 347.0 03/15/2018   NA 139 03/15/2018   K 4.6 03/15/2018   CL 100 03/15/2018   CO2 32 03/15/2018   GLUCOSE 110 (H) 03/15/2018   BUN 14 03/15/2018   CREATININE 0.84 03/15/2018   PROT 7.9 04/24/2018   CALCIUM 10.1 03/15/2018    Speciality Comments: No specialty comments available.  Procedures:  No procedures performed Allergies: Symbicort [budesonide-formoterol fumarate]; Celebrex [celecoxib]; Chocolate  flavor; Ciprofloxacin; Eggs or egg-derived products; Flavoring agent; Gabapentin; Lyrica [pregabalin]; Tramadol; Wellbutrin [bupropion]; and Codeine   Assessment / Plan:     Visit Diagnoses: Positive ANA (antinuclear antibody) - AVISE- lupus score negative.  1: 320 homogeneous, ENA-, CB CAP-, anticardiolipin-, anti-beta-2 GP 1-, RF-, anti-CCP-, anti-carbamylated protein+  Polyarthralgia-it raises concern of possible psoriatic arthritis.  Patient has some nail pitting in her left index fingernail.  There is no dactylitis.  Although she gives history of some SI joint pain.  She has had problems with Achilles tendinitis she has left patellar tendinitis on examination today.  She also has trochanteric bursitis.  She declined total body bone scan.  Primary osteoarthritis of both hands - Mild, bilateral short fourth metacarpal: She has no synovitis or dactylitis on exam. She has complete fist formation.  She has tenderness of several joints as described above.  She had generalized hyperalgesia on exam. An ultrasound of both hands will be scheduled to assess for synovitis.    Primary osteoarthritis of both knees - Mild with moderate chondromalacia patella: Chronic  pain.  No warmth or effusion of knee joints.  She has tenderness and inflammation of the left patella tendon consistent with tendonitis.  She will continue taking Aleve PRN for pain relief.   Primary osteoarthritis of both feet - Mild: She has chronic pain in both feet.  No synovitis or dactylitis noted.  We discussed the importance of wearing proper fitting shoes.   Patellar tendonitis of left knee: She has tenderness and palpable inflammation on exam.    Achilles tendinitis of left lower extremity: She continues to have left achilles tendonitis.  She has been taking Aleve for pain relief. She will be starting physical therapy on 07/02/18.   Nail pitting: Nail pitting noted in several fingernails.  No psoriasis noted.   Chronic right SI joint pain: She has tenderness of the right SI joint on exam.   Cervical myelopathy with cervical radiculopathy: She has limited ROM of the C-spine with discomfort.   Other medical conditions are listed as follows:   Abnormal SPEP  Poor circulation of extremity  COPD with asthma (HCC)  History of gastroesophageal reflux (GERD)  Former smoker   Orders: No orders of the defined types were placed in this encounter.  No orders of the defined types were placed in this encounter.   Face-to-face time spent with patient was 30 minutes. Greater than 50% of time was spent in counseling and coordination of care.  Follow-Up Instructions: Return in about 3 months (around 09/25/2018) for Positive ANA, Osteoarthritis.   Frances Neas, PA-C   I examined and evaluated the patient with Frances Sams PA.  She continues to have a lot of pain and discomfort in her joints.  She also having pain in her long bones.  Offered total body bone scan which she declined.  I will schedule ultrasound of her bilateral hands to look for synovitis.  My concern is possible psoriatic arthritis as she does have some nail pitting, SI joint pain, left patellar tendinitis and history of  Achilles tendinitis.  The plan of care was discussed as noted above.  Bo Merino, MD Note - This record has been created using Editor, commissioning.  Chart creation errors have been sought, but may not always  have been located. Such creation errors do not reflect on  the standard of medical care.

## 2018-06-17 ENCOUNTER — Ambulatory Visit (INDEPENDENT_AMBULATORY_CARE_PROVIDER_SITE_OTHER)
Admission: RE | Admit: 2018-06-17 | Discharge: 2018-06-17 | Disposition: A | Discharge status: Home / Self Care | Payer: BLUE CROSS/BLUE SHIELD | Source: Ambulatory Visit | Attending: Family | Admitting: Family

## 2018-06-17 ENCOUNTER — Other Ambulatory Visit: Payer: Self-pay | Admitting: Surgery

## 2018-06-17 ENCOUNTER — Encounter: Payer: Self-pay | Admitting: Surgery

## 2018-06-17 ENCOUNTER — Other Ambulatory Visit: Payer: Self-pay

## 2018-06-17 ENCOUNTER — Ambulatory Visit: Payer: BLUE CROSS/BLUE SHIELD | Admitting: Surgery

## 2018-06-17 ENCOUNTER — Ambulatory Visit (HOSPITAL_COMMUNITY)
Admission: RE | Admit: 2018-06-17 | Discharge: 2018-06-17 | Disposition: A | Discharge status: Home / Self Care | Payer: BLUE CROSS/BLUE SHIELD | Source: Ambulatory Visit | Attending: Surgery | Admitting: Surgery

## 2018-06-17 VITALS — BP 169/102 | HR 74 | Temp 97.6°F | Resp 16 | Ht 63.5 in | Wt 206.4 lb

## 2018-06-17 DIAGNOSIS — I7300 Raynaud's syndrome without gangrene: Secondary | ICD-10-CM

## 2018-06-17 DIAGNOSIS — M25569 Pain in unspecified knee: Secondary | ICD-10-CM | POA: Diagnosis not present

## 2018-06-17 NOTE — Progress Notes (Signed)
[facility]  Patient name: Frances Nelson MRN: [id number] DOB: 1956-03-11 Sex: female   REQUESTING PROVIDER:    Dr. Estanislado Pandy   REASON FOR CONSULT:    raynauds  HISTORY OF PRESENT ILLNESS:   Frances Nelson is a 62 y.o. female, who is referred for evaluation of Raynauds phenomenon.  The patient has been diagnosed with ANA positive vasculitis.  She has pain in most of her joints particularly in her knees.  She has recently been diagnosed with multiple myeloma.  She also has Achilles tendinitis of her left leg.  She has a papular rash in her upper extremities.  The patient has a history of COPD.  She quit smoking approximately 6 months ago.  She has chronic cough.  She has been responsive to bronchodilators.  PAST MEDICAL HISTORY    Past Medical History:  Diagnosis Date  . Asthma      FAMILY HISTORY   Family History  Problem Relation Age of Onset  . Leukemia Sister   . Brain cancer Brother        glioblastoma   . Asthma Son   . Allergies Son     SOCIAL HISTORY:   Social History   Socioeconomic History  . Marital status: Divorced    Spouse name: Not on file  . Number of children: Not on file  . Years of education: Not on file  . Highest education level: Not on file  Occupational History  . Not on file  Social Needs  . Financial resource strain: Not on file  . Food insecurity:    Worry: Not on file    Inability: Not on file  . Transportation needs:    Medical: Not on file    Non-medical: Not on file  Tobacco Use  . Smoking status: Former Smoker    Packs/day: 0.75    Years: 40.00    Pack years: 30.00    Types: Cigarettes    Last attempt to quit: 11/14/2017    Years since quitting: 0.5  . Smokeless tobacco: Never Used  Substance and Sexual Activity  . Alcohol use: No  . Drug use: Never  . Sexual activity: Not on file  Lifestyle  . Physical activity:    Days per week: Not on file   Minutes per session: Not on file  . Stress: Not on file  Relationships  . Social connections:    Talks on phone: Not on file    Gets together: Not on file    Attends religious service: Not on file    Active member of club or organization: Not on file    Attends meetings of clubs or organizations: Not on file    Relationship status: Not on file  . Intimate partner violence:    Fear of current or ex partner: Not on file    Emotionally abused: Not on file    Physically abused: Not on file    Forced sexual activity: Not on file  Other Topics Concern  . Not on file  Social History Narrative  . Not on file    ALLERGIES:    Allergies  Allergen Reactions  . Symbicort [Budesonide-Formoterol Fumarate] Other (See Comments)    Patient reported dizziness, nausea, headaches and sore throat while using Symbicort 160. Reported on 09/03/17  . Celebrex [Celecoxib]     Bleeding.    . Chocolate Flavor   . Ciprofloxacin Nausea And Vomiting    Headache, shaking.   . Eggs Or  Egg-Derived Products   . Flavoring Agent     Unknown  . Gabapentin   . Lyrica [Pregabalin]     "Made me out of it."   . Tramadol   . Wellbutrin [Bupropion]   . Codeine Palpitations    CURRENT MEDICATIONS:    Current Outpatient Medications  Medication Sig Dispense Refill  . albuterol (PROVENTIL HFA;VENTOLIN HFA) 108 (90 Base) MCG/ACT inhaler Inhale 2 puffs into the lungs every 2 (two) hours as needed for wheezing or shortness of breath (cough). 1 Inhaler 3  . albuterol (PROVENTIL) (2.5 MG/3ML) 0.083% nebulizer solution Take 3 mLs (2.5 mg total) by nebulization every 4 (four) hours as needed for wheezing or shortness of breath. 75 mL 5  . Black Pepper-Turmeric (TURMERIC CURCUMIN) 11-998 MG CAPS     . cetirizine (ZYRTEC) 10 MG tablet Take 10 mg by mouth daily.    . Diclofenac Sodium (PENNSAID) 2 % SOLN Place 1 application onto the skin 2 (two) times daily. 1 Bottle 3  . methocarbamol (ROBAXIN) 500 MG tablet TAKE 1  TABLET (500 MG TOTAL) BY MOUTH 2 (TWO) TIMES DAILY. TAKE DURING DAYTIME 60 tablet 2  . Multiple Vitamin (MULTIVITAMIN WITH MINERALS) TABS tablet Take 1 tablet by mouth daily.    . Naproxen-Esomeprazole 500-20 MG TBEC Take 1 tablet by mouth 2 (two) times daily. 60 tablet 3  . omeprazole (PRILOSEC) 40 MG capsule Take 40 mg by mouth daily.    . ranitidine (ZANTAC) 150 MG tablet Take 150 mg by mouth daily.    . tizanidine (ZANAFLEX) 6 MG capsule Take 1 capsule (6 mg total) by mouth at bedtime. 30 capsule 2   No current facility-administered medications for this visit.     REVIEW OF SYSTEMS:   '[X]'  denotes positive finding, '[ ]'  denotes negative finding Cardiac  Comments:  Chest pain or chest pressure:    Shortness of breath upon exertion: x   Short of breath when lying flat:    Irregular heart rhythm:        Vascular    Pain in calf, thigh, or hip brought on by ambulation: x   Pain in feet at night that wakes you up from your sleep:  x   Blood clot in your veins:    Leg swelling:  x       Pulmonary    Oxygen at home:    Productive cough:     Wheezing:         Neurologic    Sudden weakness in arms or legs:     Sudden numbness in arms or legs:  x   Sudden onset of difficulty speaking or slurred speech:    Temporary loss of vision in one eye:     Problems with dizziness:  x       Gastrointestinal    Blood in stool:      Vomited blood:         Genitourinary    Burning when urinating:     Blood in urine:        Psychiatric    Major depression:         Hematologic    Bleeding problems:    Problems with blood clotting too easily:        Skin    Rashes or ulcers:        Constitutional    Fever or chills:     PHYSICAL EXAM:   Vitals:   06/17/18 1353  BP: (!) 169/102  Pulse: 74  Resp: 16  Temp: 97.6 F (36.4 C)  TempSrc: Oral  SpO2: 94%  Weight: 206 lb 6.4 oz (93.6 kg)  Height: 5' 3.5" (1.613 m)    GENERAL: The patient is a well-nourished female, in no acute  distress. The vital signs are documented above. CARDIAC: There is a regular rate and rhythm.  VASCULAR: Palpable dorsalis pedis pulse bilaterally.  Palpable radial pulse bilaterally PULMONARY: Nonlabored respirations ABDOMEN: Soft and non-tender with normal pitched bowel sounds.  MUSCULOSKELETAL: There are no major deformities or cyanosis. NEUROLOGIC: No focal weakness or paresthesias are detected. SKIN: There are no ulcers or rashes noted. PSYCHIATRIC: The patient has a normal affect.  STUDIES:   I have ordered and reviewed her vascular lab studies with the following findings:  Great toe waveforms and pressures remain normal after ice water compression of the right and left first digit.  Upper extremity: Normal waveforms noted pre-and post warming of digits  ASSESSMENT and PLAN   No evidence of Raynaud's was found by ultrasound today.  The patient does appear to be having some systemic vasculitis symptoms.  We discussed that flareups of vasculitis are treated medically and that  there is limited role for surgical intervention.  I discussed that I would relay the results of our studies today to her primary physicians.  She will contact me if she has any other questions or issues.   Annamarie Major, MD [facility] [phone] Pager [phone]

## 2018-06-18 ENCOUNTER — Ambulatory Visit (INDEPENDENT_AMBULATORY_CARE_PROVIDER_SITE_OTHER): Payer: BLUE CROSS/BLUE SHIELD

## 2018-06-18 ENCOUNTER — Encounter: Payer: Self-pay | Admitting: Family Medicine

## 2018-06-18 ENCOUNTER — Ambulatory Visit: Payer: BLUE CROSS/BLUE SHIELD | Admitting: Family Medicine

## 2018-06-18 ENCOUNTER — Other Ambulatory Visit: Payer: Self-pay | Admitting: Family Medicine

## 2018-06-18 VITALS — BP 144/90 | HR 91 | Temp 98.3°F | Ht 63.5 in | Wt 207.8 lb

## 2018-06-18 DIAGNOSIS — M7662 Achilles tendinitis, left leg: Secondary | ICD-10-CM

## 2018-06-18 DIAGNOSIS — M19072 Primary osteoarthritis, left ankle and foot: Secondary | ICD-10-CM | POA: Diagnosis not present

## 2018-06-18 LAB — URIC ACID: Uric Acid, Serum: 5.8 mg/dL (ref 2.4–7.0)

## 2018-06-18 NOTE — Patient Instructions (Signed)
Good to see you  Please try physical therapy  I will call you with the results from today  Please see me back in 4-6 weeks

## 2018-06-18 NOTE — Progress Notes (Signed)
Frances Nelson - 62 y.o. female MRN [id number]  Date of birth: 1955-10-28  SUBJECTIVE:  Including CC & ROS.  Chief Complaint  Patient presents with  . Tendonitis    left achilles tendinitis    Frances Nelson is a 62 y.o. female that is following up for left Achilles tendinopathy.  She has been dealing with this for a few months now.  Lab work has been unrevealing for a systemic source.  Ultrasound has been demonstrating significant vascular changes at the insertion of the Achilles.  Has tried using a cam walker.  Has not had much relief with any medications.  Did get some improvement with prednisone.  Pain is still occurring but seems to have some improvement today.  Localized to the insertion of the Achilles.    Review of Systems  Constitutional: Negative for fever.  HENT: Negative for congestion.   Respiratory: Negative for cough.   Cardiovascular: Negative for chest pain.  Gastrointestinal: Negative for abdominal pain.  Musculoskeletal: Positive for gait problem.  Skin: Negative for color change.  Neurological: Negative for weakness.  Hematological: Negative for adenopathy.  Psychiatric/Behavioral: Negative for agitation.    HISTORY: Past Medical, Surgical, Social, and Family History Reviewed & Updated per EMR.   Pertinent Historical Findings include:  Past Medical History:  Diagnosis Date  . Asthma     Past Surgical History:  Procedure Laterality Date  . ABDOMINAL HYSTERECTOMY    . APPENDECTOMY    . HERNIA REPAIR     Umbilical x 3  . TONSILLECTOMY      Allergies  Allergen Reactions  . Symbicort [Budesonide-Formoterol Fumarate] Other (See Comments)    Patient reported dizziness, nausea, headaches and sore throat while using Symbicort 160. Reported on 09/03/17  . Celebrex [Celecoxib]     Bleeding.    . Chocolate Flavor   . Ciprofloxacin Nausea And Vomiting    Headache, shaking.   . Eggs Or Egg-Derived Products   . Flavoring Agent     Unknown  . Gabapentin   .  Lyrica [Pregabalin]     "Made me out of it."   . Tramadol   . Wellbutrin [Bupropion]   . Codeine Palpitations    Family History  Problem Relation Age of Onset  . Leukemia Sister   . Brain cancer Brother        glioblastoma   . Asthma Son   . Allergies Son      Social History   Socioeconomic History  . Marital status: Divorced    Spouse name: Not on file  . Number of children: Not on file  . Years of education: Not on file  . Highest education level: Not on file  Occupational History  . Not on file  Social Needs  . Financial resource strain: Not on file  . Food insecurity:    Worry: Not on file    Inability: Not on file  . Transportation needs:    Medical: Not on file    Non-medical: Not on file  Tobacco Use  . Smoking status: Former Smoker    Packs/day: 0.75    Years: 40.00    Pack years: 30.00    Types: Cigarettes    Last attempt to quit: 11/14/2017    Years since quitting: 0.5  . Smokeless tobacco: Never Used  Substance and Sexual Activity  . Alcohol use: No  . Drug use: Never  . Sexual activity: Not on file  Lifestyle  . Physical activity:    Days per  week: Not on file    Minutes per session: Not on file  . Stress: Not on file  Relationships  . Social connections:    Talks on phone: Not on file    Gets together: Not on file    Attends religious service: Not on file    Active member of club or organization: Not on file    Attends meetings of clubs or organizations: Not on file    Relationship status: Not on file  . Intimate partner violence:    Fear of current or ex partner: Not on file    Emotionally abused: Not on file    Physically abused: Not on file    Forced sexual activity: Not on file  Other Topics Concern  . Not on file  Social History Narrative  . Not on file     PHYSICAL EXAM:  VS: BP (!) 144/90 (BP Location: Right Arm, Patient Position: Sitting, Cuff Size: Normal)   Pulse 91   Temp 98.3 F (36.8 C) (Oral)   Ht 5' 3.5" (1.613 m)    Wt 207 lb 12.8 oz (94.3 kg)   SpO2 96%   BMI 36.23 kg/m  Physical Exam Gen: NAD, alert, cooperative with exam, well-appearing ENT: normal lips, normal nasal mucosa,  Eye: normal EOM, normal conjunctiva and lids CV:  no edema, +2 pedal pulses   Resp: no accessory muscle use, non-labored,  Skin: no rashes, no areas of induration  Neuro: normal tone, normal sensation to touch Psych:  normal insight, alert and oriented MSK:  Left Achilles: Haglund's deformity still present. Tenderness to palpation over the mid substance of the Achilles proximally 4 cm proximal to the insertion. Normal range of motion. Normal strength. Plantarflexion with [place] Silos test. Neurovascular intact  Limited ultrasound: Left Achilles:  Calcaneal heel spurs present at the insertion of the Achilles. Significant increased vascular changes at the insertion but improved from previous scan.  Thickening and mucus changes at the insertion of the Achilles. Normal-appearing proximal Achilles roughly 6 cm from the insertion  Summary: Ongoing Achilles tendinopathy  Ultrasound and interpretation by Clearance Coots, MD      ASSESSMENT & PLAN:   Achilles tendinitis Ongoing with her pain with no systemic culprit identified.   -Uric acid and anti-CCP today. -X-ray. -Referral to physical therapy. -If no improvement may need to consider PRP or MRI.

## 2018-06-18 NOTE — Assessment & Plan Note (Signed)
Ongoing with her pain with no systemic culprit identified.   -Uric acid and anti-CCP today. -X-ray. -Referral to physical therapy. -If no improvement may need to consider PRP or MRI.

## 2018-06-19 ENCOUNTER — Encounter: Payer: Self-pay | Admitting: Family Medicine

## 2018-06-19 ENCOUNTER — Telehealth: Payer: Self-pay | Admitting: Family Medicine

## 2018-06-19 LAB — CYCLIC CITRUL PEPTIDE ANTIBODY, IGG: Cyclic Citrullin Peptide Ab: <16 UNITS

## 2018-06-19 NOTE — Telephone Encounter (Signed)
Please refer to previous result note

## 2018-06-19 NOTE — Telephone Encounter (Signed)
Informed of results.   Rosemarie Ax, MD [facility] 06/19/2018, 11:23 AM

## 2018-06-26 ENCOUNTER — Ambulatory Visit
Admission: RE | Admit: 2018-06-26 | Discharge: 2018-06-26 | Disposition: A | Discharge status: Home / Self Care | Payer: BLUE CROSS/BLUE SHIELD | Source: Ambulatory Visit | Attending: Family Medicine | Admitting: Family Medicine

## 2018-06-26 ENCOUNTER — Encounter: Payer: Self-pay | Admitting: Physician Assistant

## 2018-06-26 ENCOUNTER — Ambulatory Visit: Payer: BLUE CROSS/BLUE SHIELD | Admitting: Physician Assistant

## 2018-06-26 VITALS — BP 148/85 | HR 75 | Resp 14 | Ht 63.5 in | Wt 206.6 lb

## 2018-06-26 DIAGNOSIS — M533 Sacrococcygeal disorders, not elsewhere classified: Secondary | ICD-10-CM

## 2018-06-26 DIAGNOSIS — M5412 Radiculopathy, cervical region: Secondary | ICD-10-CM

## 2018-06-26 DIAGNOSIS — G959 Disease of spinal cord, unspecified: Secondary | ICD-10-CM

## 2018-06-26 DIAGNOSIS — L608 Other nail disorders: Secondary | ICD-10-CM

## 2018-06-26 DIAGNOSIS — Z1231 Encounter for screening mammogram for malignant neoplasm of breast: Secondary | ICD-10-CM | POA: Diagnosis not present

## 2018-06-26 DIAGNOSIS — M19072 Primary osteoarthritis, left ankle and foot: Secondary | ICD-10-CM

## 2018-06-26 DIAGNOSIS — M19071 Primary osteoarthritis, right ankle and foot: Secondary | ICD-10-CM | POA: Diagnosis not present

## 2018-06-26 DIAGNOSIS — R778 Other specified abnormalities of plasma proteins: Secondary | ICD-10-CM

## 2018-06-26 DIAGNOSIS — M170 Bilateral primary osteoarthritis of knee: Secondary | ICD-10-CM

## 2018-06-26 DIAGNOSIS — M19041 Primary osteoarthritis, right hand: Secondary | ICD-10-CM | POA: Diagnosis not present

## 2018-06-26 DIAGNOSIS — M7652 Patellar tendinitis, left knee: Secondary | ICD-10-CM

## 2018-06-26 DIAGNOSIS — M7662 Achilles tendinitis, left leg: Secondary | ICD-10-CM

## 2018-06-26 DIAGNOSIS — M4712 Other spondylosis with myelopathy, cervical region: Secondary | ICD-10-CM

## 2018-06-26 DIAGNOSIS — G8929 Other chronic pain: Secondary | ICD-10-CM

## 2018-06-26 DIAGNOSIS — R0989 Other specified symptoms and signs involving the circulatory and respiratory systems: Secondary | ICD-10-CM

## 2018-06-26 DIAGNOSIS — I7300 Raynaud's syndrome without gangrene: Secondary | ICD-10-CM

## 2018-06-26 DIAGNOSIS — R768 Other specified abnormal immunological findings in serum: Secondary | ICD-10-CM | POA: Diagnosis not present

## 2018-06-26 DIAGNOSIS — M19042 Primary osteoarthritis, left hand: Secondary | ICD-10-CM

## 2018-06-26 DIAGNOSIS — J449 Chronic obstructive pulmonary disease, unspecified: Secondary | ICD-10-CM

## 2018-06-26 DIAGNOSIS — Z1239 Encounter for other screening for malignant neoplasm of breast: Secondary | ICD-10-CM

## 2018-06-26 DIAGNOSIS — Z87891 Personal history of nicotine dependence: Secondary | ICD-10-CM

## 2018-06-26 DIAGNOSIS — Z8719 Personal history of other diseases of the digestive system: Secondary | ICD-10-CM

## 2018-06-28 ENCOUNTER — Ambulatory Visit: Payer: BLUE CROSS/BLUE SHIELD | Admitting: Family Medicine

## 2018-06-28 ENCOUNTER — Encounter: Payer: Self-pay | Admitting: Family Medicine

## 2018-06-28 VITALS — BP 148/76 | HR 76 | Temp 98.1°F | Ht 63.5 in | Wt 207.0 lb

## 2018-06-28 DIAGNOSIS — J329 Chronic sinusitis, unspecified: Secondary | ICD-10-CM | POA: Insufficient documentation

## 2018-06-28 DIAGNOSIS — M2550 Pain in unspecified joint: Secondary | ICD-10-CM | POA: Insufficient documentation

## 2018-06-28 DIAGNOSIS — J0140 Acute pansinusitis, unspecified: Secondary | ICD-10-CM

## 2018-06-28 DIAGNOSIS — R768 Other specified abnormal immunological findings in serum: Secondary | ICD-10-CM | POA: Diagnosis not present

## 2018-06-28 MED ORDER — IBUPROFEN 800 MG PO TABS: 800.0000 mg | ORAL_TABLET | Freq: Three times a day (TID) | ORAL | 1 refills | Status: DC | PRN

## 2018-06-28 MED ORDER — CEFDINIR 300 MG PO CAPS: 300.0000 mg | ORAL_CAPSULE | Freq: Two times a day (BID) | ORAL | 0 refills | Status: AC

## 2018-06-28 NOTE — Assessment & Plan Note (Signed)
-  Increased fluids -Rest -Rx for cefdinir -Suggested humidifier or vaporizer -F/u if not improving.

## 2018-06-28 NOTE — Patient Instructions (Signed)

## 2018-06-28 NOTE — Progress Notes (Signed)
Frances Nelson - 62 y.o. female MRN [id number]  Date of birth: 1955/09/25  Subjective Chief Complaint  Patient presents with  . Otalgia    Bilateral ear pain-present for two days. Admits to sore throat. Denies fevers. Sinus discharge.     HPI  Frances Nelson is a 62 y.o. female here today with complaint of bilateral ear pain, sore throat and sinus pain.  She reports that symptoms began a few days ago.  Nasal discharge is green with blood mixed in.  She has been using OTC cold medication without much relief.  She is drinking a good amount of fluids.  She denies fever, chills, nausea, vomiting, diarrhea, headache or vision changes.  She also is concerned about previous +ANA also had +PANCA testing.  She is seeing rheumatology but doesn't feel like she has gotten anywhere with them.  She continues to have joint pain and swelling.  They are planning on doing an ultrasound to evaluate for synovitis.  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  She would like a referral to another rheumatologist at [facility] for 2nd opinion as well.    ROS:  A comprehensive ROS was completed and negative except as noted per HPI  Allergies  Allergen Reactions  . Symbicort [Budesonide-Formoterol Fumarate] Other (See Comments)    Patient reported dizziness, nausea, headaches and sore throat while using Symbicort 160. Reported on 09/03/17  . Celebrex [Celecoxib]     Bleeding.    . Chocolate Flavor   . Ciprofloxacin Nausea And Vomiting    Headache, shaking.   . Eggs Or Egg-Derived Products   . Flavoring Agent     Unknown  . Gabapentin   . Lyrica [Pregabalin]     "Made me out of it."   . Tramadol   . Wellbutrin [Bupropion]   . Codeine Palpitations    Past Medical History:  Diagnosis Date  . Asthma     Past Surgical History:  Procedure Laterality Date  . ABDOMINAL HYSTERECTOMY    . APPENDECTOMY    . HERNIA REPAIR     Umbilical x 3  . TONSILLECTOMY       Social History   Socioeconomic History  . Marital status: Divorced    Spouse name: Not on file  . Number of children: Not on file  . Years of education: Not on file  . Highest education level: Not on file  Occupational History  . Not on file  Social Needs  . Financial resource strain: Not on file  . Food insecurity:    Worry: Not on file    Inability: Not on file  . Transportation needs:    Medical: Not on file    Non-medical: Not on file  Tobacco Use  . Smoking status: Former Smoker    Packs/day: 0.75    Years: 40.00    Pack years: 30.00    Types: Cigarettes    Last attempt to quit: 11/14/2017    Years since quitting: 0.6  . Smokeless tobacco: Never Used  Substance and Sexual Activity  . Alcohol use: No  . Drug use: Never  . Sexual activity: Not on file  Lifestyle  . Physical activity:    Days per week: Not on file    Minutes per session: Not on file  . Stress: Not on file  Relationships  . Social connections:    Talks on phone: Not on file    Gets together: Not on  file    Attends religious service: Not on file    Active member of club or organization: Not on file    Attends meetings of clubs or organizations: Not on file    Relationship status: Not on file  Other Topics Concern  . Not on file  Social History Narrative  . Not on file    Family History  Problem Relation Age of Onset  . Leukemia Sister   . Brain cancer Brother        glioblastoma   . Asthma Son   . Allergies Son     Health Maintenance  Topic Date Due  . PAP SMEAR-Modifier  11/20/1976  . COLONOSCOPY  11/20/2005  . INFLUENZA VACCINE  10/15/2018 (Originally 02/14/2018)  . HIV Screening  11/14/2018 (Originally 11/21/1970)  . MAMMOGRAM  06/26/2020  . TETANUS/TDAP  03/15/2028  . Hepatitis C Screening  Completed     ----------------------------------------------------------------------------------------------------------------------------------------------------------------------------------------------------------------- Physical Exam BP (!) 148/76   Pulse 76   Temp 98.1 F (36.7 C) (Oral)   Ht 5' 3.5" (1.613 m)   Wt 207 lb (93.9 kg)   SpO2 96%   BMI 36.09 kg/m   Physical Exam Constitutional:      Appearance: Normal appearance. She is ill-appearing. She is not toxic-appearing.  HENT:     Head: Normocephalic and atraumatic.     Right Ear: Tympanic membrane normal.     Left Ear: Tympanic membrane normal.     Nose: Congestion present.     Comments: Bilateral maxillary sinus tenderness, R>L    Mouth/Throat:     Mouth: Mucous membranes are moist.     Pharynx: No oropharyngeal exudate.  Eyes:     General: No scleral icterus. Neck:     Musculoskeletal: Neck supple.  Cardiovascular:     Rate and Rhythm: Normal rate and regular rhythm.  Pulmonary:     Effort: Pulmonary effort is normal.     Breath sounds: Normal breath sounds.  Lymphadenopathy:     Cervical: No cervical adenopathy.  Skin:    General: Skin is warm and dry.  Neurological:     General: No focal deficit present.     Mental Status: She is alert.  Psychiatric:        Mood and Affect: Mood normal.        Behavior: Behavior normal.     ------------------------------------------------------------------------------------------------------------------------------------------------------------------------------------------------------------------- Assessment and Plan  Arthralgia -Continued arthralgia and some neuropathic symptoms she would like 2nd opinion from rheumatology at [facility], referral placed.  -Check Lyme titers today.   Sinusitis -Increased fluids -Rest -Rx for cefdinir -Suggested humidifier or vaporizer -F/u if not improving.

## 2018-06-28 NOTE — Assessment & Plan Note (Signed)
-  Continued arthralgia and some neuropathic symptoms she would like 2nd opinion from rheumatology at [facility], referral placed.  -Check Lyme titers today.

## 2018-07-01 ENCOUNTER — Encounter (HOSPITAL_COMMUNITY): Payer: BLUE CROSS/BLUE SHIELD

## 2018-07-01 ENCOUNTER — Encounter: Payer: BLUE CROSS/BLUE SHIELD | Admitting: Surgery

## 2018-07-01 LAB — LYME AB/WESTERN BLOT REFLEX
LYME DISEASE AB, QUANT, IGM: <0.8 index (ref 0.00–0.79)
Lyme IgG/IgM Ab: <0.91 {ISR} (ref 0.00–0.90)

## 2018-07-02 ENCOUNTER — Other Ambulatory Visit: Payer: Self-pay

## 2018-07-02 ENCOUNTER — Ambulatory Visit: Payer: BLUE CROSS/BLUE SHIELD | Attending: Family Medicine | Admitting: Physical Therapy

## 2018-07-02 DIAGNOSIS — M25672 Stiffness of left ankle, not elsewhere classified: Secondary | ICD-10-CM | POA: Diagnosis not present

## 2018-07-02 DIAGNOSIS — M25572 Pain in left ankle and joints of left foot: Secondary | ICD-10-CM | POA: Insufficient documentation

## 2018-07-02 DIAGNOSIS — R2689 Other abnormalities of gait and mobility: Secondary | ICD-10-CM | POA: Insufficient documentation

## 2018-07-02 DIAGNOSIS — M25662 Stiffness of left knee, not elsewhere classified: Secondary | ICD-10-CM | POA: Insufficient documentation

## 2018-07-02 DIAGNOSIS — G8929 Other chronic pain: Secondary | ICD-10-CM | POA: Diagnosis not present

## 2018-07-02 DIAGNOSIS — M25562 Pain in left knee: Secondary | ICD-10-CM | POA: Diagnosis not present

## 2018-07-02 NOTE — Therapy (Signed)
[address] [place], [place], [address] Phone: [phone]   Fax:  [phone]  Physical Therapy Evaluation  Patient Details  Name: Frances Nelson MRN: [id number] Date of Birth: 07/12/1956 Referring Provider (PT): Renaye Rakers   Encounter Date: 07/02/2018  PT End of Session - 07/02/18 1021    Visit Number  1    Date for PT Re-Evaluation  08/27/18    PT Start Time  1021    PT Stop Time  1118    PT Time Calculation (min)  57 min    Activity Tolerance  Patient limited by pain    Behavior During Therapy  [facility] for tasks assessed/performed       Past Medical History:  Diagnosis Date  . Asthma     Past Surgical History:  Procedure Laterality Date  . ABDOMINAL HYSTERECTOMY    . APPENDECTOMY    . HERNIA REPAIR     Umbilical x 3  . TONSILLECTOMY      There were no vitals filed for this visit.   Subjective Assessment - 07/02/18 1030    Subjective  Patient reports 4 year h/o left achilles tendon pain and left knee pain starting in August 2019. The knee pain came from exercises that were being done for the achilles. The achilles is constant pain. Xray shows plantar and calcaneal spur.    Pertinent History  OA, COPD, asthma, sees a rheumatologist (possilby psoriatic arthritis); seeing oncologist (no CA dx at this time)    How long can you stand comfortably?  10 min    How long can you walk comfortably?  5-10 min    Diagnostic tests  xray, [place]    Patient Stated Goals  to get rid of pain in heel and knee    Currently in Pain?  Yes    Pain Score  6     Pain Location  Heel    Pain Orientation  Left    Pain Descriptors / Indicators  Throbbing;Sharp    Pain Type  Chronic pain    Pain Onset  More than a month ago    Pain Frequency  Constant    Aggravating Factors   standing, walking, ice    Pain Relieving Factors  nothing    Effect of Pain on Daily Activities  limited with walking    Multiple Pain Sites   Yes    Pain Score  4    Pain Location  Knee    Pain Orientation  Left    Pain Descriptors / Indicators  Tightness;Constant;Dull    Pain Type  Chronic pain    Pain Onset  More than a month ago    Pain Frequency  Constant    Aggravating Factors   walking    Pain Relieving Factors  rest    Effect of Pain on Daily Activities  limited walking         [facility] PT Assessment - 07/02/18 0001      Assessment   Medical Diagnosis  Left achilles tendinitis    Referring Provider (PT)  Renaye Rakers    Onset Date/Surgical Date  07/17/17    Hand Dominance  Right    Next MD Visit  07/15/18      Precautions   Precautions  None      Restrictions   Weight Bearing Restrictions  No      Balance Screen   Has the patient fallen in the past 6  months  No    Has the patient had a decrease in activity level because of a fear of falling?   No    Is the patient reluctant to leave their home because of a fear of falling?   No      Home Film/video editor residence    Additional Comments  two story house; step to gait      Prior Function   Level of Independence  Independent    Vocation  Unemployed      Observation/Other Assessments   Focus on Therapeutic Outcomes (FOTO)   68% limited      ROM / Strength   AROM / PROM / Strength  AROM;Strength;PROM      AROM   AROM Assessment Site  Knee;Ankle    Right/Left Knee  Left    Left Knee Extension  0    Left Knee Flexion  70    Right/Left Ankle  Left    Left Ankle Dorsiflexion  3    Left Ankle Plantar Flexion  48    Left Ankle Inversion  15    Left Ankle Eversion  8      PROM   PROM Assessment Site  Knee;Ankle    Right/Left Knee  Left    Left Knee Extension  0    Left Knee Flexion  117    Right/Left Ankle  Left    Left Ankle Dorsiflexion  7    Left Ankle Plantar Flexion  full    Left Ankle Inversion  25    Left Ankle Eversion  12   pain     Strength   Overall Strength Comments  left knee 4+/5, ankle 3+/5 due to  pain      Palpation   Patella mobility  decreased all planes    Palpation comment  marked tenderness of L quads, quad tendon, ITB, medial knee, post knee, achilles                Objective measurements completed on examination: See above findings.      [facility] Adult PT Treatment/Exercise - 07/02/18 0001      Modalities   Modalities  Electrical Stimulation;Iontophoresis      Electrical Stimulation   Electrical Stimulation Location  Left knee    Electrical Stimulation Action  IFC    Electrical Stimulation Parameters  supine    Electrical Stimulation Goals  Pain      Iontophoresis   Type of Iontophoresis  Dexamethasone    Location  Left achilles    Dose  61mAmin    Time  4 hour patch             PT Education - 07/02/18 1152    Education Details  ionto education;    Person(s) Educated  Patient    Methods  Explanation;Handout    Comprehension  Verbalized understanding       PT Short Term Goals - 07/02/18 1217      PT SHORT TERM GOAL #1   Title  Ind with initial HEP    Time  2    Period  Weeks    Status  New    Target Date  07/16/18      PT SHORT TERM GOAL #2   Title  Patient to report decreased pain in left knee by 50% or more with ADLS    Time  4    Period  Weeks    Status  New  Target Date  07/30/18      PT SHORT TERM GOAL #3   Title  Patient to report decreased pain in left heel by 50% or more with walking    Time  4    Period  Weeks    Status  New      PT SHORT TERM GOAL #4   Title  Patient to demo active left knee flexion of 115 degrees to normalize ADLS.    Time  4    Period  Weeks    Status  New        PT Long Term Goals - 07/02/18 1219      PT LONG TERM GOAL #1   Title  Patient able to ambulate for 30 min with 3/10 pain or less in the heel.    Time  8    Status  New    Target Date  08/27/18      PT LONG TERM GOAL #2   Title  Patient to report decreased knee pain to 1/10 or less with ADLS.    Time  8    Period  Weeks     Status  New      PT LONG TERM GOAL #3   Title  Patient to demo 4 to 4+/5 strength in the left hip/knee and ankle to improve function.    Time  8    Period  Weeks    Status  New      PT LONG TERM GOAL #4   Title  Patient to demo functional left ankle ROM to help normalize gait and other ADLS.    Time  8    Period  Weeks    Status  New      PT LONG TERM GOAL #5   Title  Patient able to climb stairs with reciprocal gait pattern.    Time  8    Status  New             Plan - 07/02/18 1153    Clinical Impression Statement  Patient presents with c/o left achilles and left knee pain. She has a 4 year h/o left achilles tendon pain and left knee pain starting in August 2019. The knee pain came from exercises that were being done for the achilles. She has limited ROM in the ankle and knee as well as flexibility and strength deficits. She has an antalgic gait and is limited with standing and walking.     History and Personal Factors relevant to plan of care:  OA, COPD, asthma, sees a rheumatologist (possilby psoriatic arthritis); seeing oncologist (no CA dx at this time)    Clinical Presentation  Evolving    Clinical Presentation due to:  changing symptoms    Clinical Decision Making  Low    Rehab Potential  Good    Clinical Impairments Affecting Rehab Potential  comorbidities    PT Frequency  2x / week    PT Duration  8 weeks    PT Treatment/Interventions  ADLs/Self Care Home Management;Electrical Stimulation;Cryotherapy;Iontophoresis 4mg /ml Dexamethasone;Moist Heat;Ultrasound;Therapeutic exercise;Neuromuscular re-education;Patient/family education;Manual techniques;Dry needling;Taping;Vasopneumatic Device    PT Next Visit Plan  work on pain control and try gentle stretching/strengthening; manual prn to L quads/ITB, patellar mobility    Consulted and Agree with Plan of Care  Patient       Patient will benefit from skilled therapeutic intervention in order to improve the following  deficits and impairments:  Abnormal gait, Pain, Decreased mobility, Decreased activity tolerance,  Decreased range of motion, Decreased strength, Impaired flexibility, Increased edema  Visit Diagnosis: Pain in left ankle and joints of left foot - Plan: PT plan of care cert/re-cert  Chronic pain of left knee - Plan: PT plan of care cert/re-cert  Stiffness of left ankle, not elsewhere classified - Plan: PT plan of care cert/re-cert  Stiffness of left knee, not elsewhere classified - Plan: PT plan of care cert/re-cert  Other abnormalities of gait and mobility - Plan: PT plan of care cert/re-cert     Problem List Patient Active Problem List   Diagnosis Date Noted  . Arthralgia 06/28/2018  . Sinusitis 06/28/2018  . Renal cyst 05/03/2018  . Syncope 05/03/2018  . Stress and adjustment reaction 05/03/2018  . History of tobacco use 05/02/2018  . Primary osteoarthritis of both hands 04/10/2018  . Primary osteoarthritis of both knees 04/10/2018  . Primary osteoarthritis of both feet 04/10/2018  . Achilles tendinitis 04/02/2018  . Screening for breast cancer 03/15/2018  . Positive ANA (antinuclear antibody) 03/15/2018  . Screening for colon cancer 03/15/2018  . Rash 03/15/2018  . Achilles tendon pain 11/13/2017  . History of systemic lupus erythematosus (SLE) ([place]) 11/13/2017  . Cervical myelopathy with cervical radiculopathy 11/13/2017  . Elevated blood pressure reading without diagnosis of hypertension 11/13/2017  . COPD with asthma ([place]) 06/19/2017  . Allergic rhinitis 06/19/2017    Madelyn Flavors PT 07/02/2018, 12:33 PM  [address] [place], [place], [address] Phone: [phone]   Fax:  [phone]  Name: Frances Nelson MRN: [id number] Date of Birth: [birth year]

## 2018-07-02 NOTE — Patient Instructions (Signed)
IONTOPHORESIS PATIENT PRECAUTIONS & CONTRAINDICATIONS:   Remove patch after 4-5 hours   . Redness under one or both electrodes can occur.  This characterized by a uniform redness that usually disappears within 12 hours of treatment. . Small pinhead size blisters may result in response to the drug.  Contact your physician if the problem persists more than 24 hours. . On rare occasions, iontophoresis therapy can result in temporary skin reactions such as rash, inflammation, irritation or burns.  The skin reactions may be the result of individual sensitivity to the ionic solution used, the condition of the skin at the start of treatment, reaction to the materials in the electrodes, allergies or sensitivity to dexamethasone, or a poor connection between the patch and your skin.  Discontinue using iontophoresis if you have any of these reactions and report to your therapist. . Remove the Patch or electrodes if you have any undue sensation of pain or burning during the treatment and report discomfort to your therapist. . Tell your Therapist if you have had known adverse reactions to the application of electrical current.  . DO NOT use if you have a cardiac pacemaker or any other electrically sensitive implanted device. . DO NOT use if you have a known sensitivity to dexamethasone. . DO NOT use during Magnetic Resonance Imaging (MRI). . DO NOT use over broken or compromised skin (e.g. sunburn, cuts, or acne) due to the increased risk of skin reaction. . DO NOT SHAVE over the area to be treated:  To establish good contact between the Patch and the skin, excessive hair may be clipped. . DO NOT place the Patch or electrodes on or over your eyes, directly over your heart, or brain. . DO NOT reuse the Patch or electrodes as this may cause burns to occur.

## 2018-07-02 NOTE — Progress Notes (Signed)
-  negative lyme testing.

## 2018-07-03 ENCOUNTER — Ambulatory Visit: Payer: BLUE CROSS/BLUE SHIELD | Admitting: Physical Therapy

## 2018-07-03 DIAGNOSIS — G8929 Other chronic pain: Secondary | ICD-10-CM | POA: Diagnosis not present

## 2018-07-03 DIAGNOSIS — M25672 Stiffness of left ankle, not elsewhere classified: Secondary | ICD-10-CM | POA: Diagnosis not present

## 2018-07-03 DIAGNOSIS — M25562 Pain in left knee: Secondary | ICD-10-CM

## 2018-07-03 DIAGNOSIS — R2689 Other abnormalities of gait and mobility: Secondary | ICD-10-CM | POA: Diagnosis not present

## 2018-07-03 DIAGNOSIS — M25572 Pain in left ankle and joints of left foot: Secondary | ICD-10-CM

## 2018-07-03 DIAGNOSIS — M25662 Stiffness of left knee, not elsewhere classified: Secondary | ICD-10-CM

## 2018-07-03 NOTE — Progress Notes (Signed)
HEMATOLOGY/ONCOLOGY CONSULTATION NOTE  Date of Service: 07/04/2018  Patient Care Team: Luetta Nutting, DO as PCP - General (Family Medicine) Bo Merino, MD as Consulting Physician (Rheumatology) Rosemarie Ax, MD as Consulting Physician (Family Medicine) Brunetta Genera, MD as Consulting Physician (Hematology)  CHIEF COMPLAINTS/PURPOSE OF CONSULTATION:  Abnormal SPEP  HISTORY OF PRESENTING ILLNESS:   Frances Nelson is a wonderful 62 y.o. female who has been referred to [place] by Dr. Luetta Nutting for evaluation and management of her abnormal SPEP. She is accompanied today by her son. The pt reports that she is doing well overall.   The pt reports a history of asthma, and denies a history of psoriasis though her Rheumatologist has noted some concern for psoriatic arthritis. The pt notes that she has had joint pains for the last 5 years, and that these have been worked up for the past 5 years. The pt sees Dr. Estanislado Pandy in Rheumatology. The pt notes that her knees, hips, hands, and feet are the most bothersome joints. She also notes that her arm bones and thigh bones hurt. The pt notes that she has also had back pains for the last 5 years. She takes naproxen and diclofenac for her pain relief. She denies any history of disc problems and denies any specific pain along the spine.   Pt notes that she has developed intermittent rashes on her elbows, knees, back of legs, wrists, and has used steroids without much relief. "Rashes go away with time." Develops blisters which pop, then skin becomes scaly. No rashes around the eyes. Muscle pain in back of legs. The pt denies having any skin rashes today in clinic.   The pt notes that her blood pressure has been somewhat stable. She notes that her weight has gone "up a lot over the last 6 months."  Most recent lab results (03/15/18) of CBC and BMP is as follows: all values are WNL except for Glucose at 110. 04/24/18 SPEP revealed all values  WNL except for Alpha2 at 1.0g, and there was "a poorly defined band of restricted protein mobility is detected in the gamma globulins."  On review of systems, pt reports knee pains, thigh and arm pains, intermittent skin rashes, muscle pains in back of legs, joint pains, weight gain, and denies fevers, chills, night sweats, specific pain along the spine, abdominal pains, leg swelling, and any other symptoms.   On PMHx the pt reports asthma, kidney cysts. On Family Hx the pt denies kidney cysts and denies end stage CKD    MEDICAL HISTORY:  Past Medical History:  Diagnosis Date  . Asthma     SURGICAL HISTORY: Past Surgical History:  Procedure Laterality Date  . ABDOMINAL HYSTERECTOMY    . APPENDECTOMY    . HERNIA REPAIR     Umbilical x 3  . TONSILLECTOMY      SOCIAL HISTORY: Social History   Socioeconomic History  . Marital status: Divorced    Spouse name: Not on file  . Number of children: Not on file  . Years of education: Not on file  . Highest education level: Not on file  Occupational History  . Not on file  Social Needs  . Financial resource strain: Not on file  . Food insecurity:    Worry: Not on file    Inability: Not on file  . Transportation needs:    Medical: Not on file    Non-medical: Not on file  Tobacco Use  . Smoking status: Former Smoker  Packs/day: 0.75    Years: 40.00    Pack years: 30.00    Types: Cigarettes    Last attempt to quit: 11/14/2017    Years since quitting: 0.6  . Smokeless tobacco: Never Used  Substance and Sexual Activity  . Alcohol use: No  . Drug use: Never  . Sexual activity: Not on file  Lifestyle  . Physical activity:    Days per week: Not on file    Minutes per session: Not on file  . Stress: Not on file  Relationships  . Social connections:    Talks on phone: Not on file    Gets together: Not on file    Attends religious service: Not on file    Active member of club or organization: Not on file    Attends  meetings of clubs or organizations: Not on file    Relationship status: Not on file  . Intimate partner violence:    Fear of current or ex partner: Not on file    Emotionally abused: Not on file    Physically abused: Not on file    Forced sexual activity: Not on file  Other Topics Concern  . Not on file  Social History Narrative  . Not on file    FAMILY HISTORY: Family History  Problem Relation Age of Onset  . Leukemia Sister   . Brain cancer Brother        glioblastoma   . Asthma Son   . Allergies Son     ALLERGIES:  is allergic to symbicort [budesonide-formoterol fumarate]; celebrex [celecoxib]; chocolate flavor; ciprofloxacin; eggs or egg-derived products; flavoring agent; gabapentin; lyrica [pregabalin]; tramadol; wellbutrin [bupropion]; and codeine.  MEDICATIONS:  Current Outpatient Medications  Medication Sig Dispense Refill  . albuterol (PROVENTIL) (2.5 MG/3ML) 0.083% nebulizer solution Take 3 mLs (2.5 mg total) by nebulization every 4 (four) hours as needed for wheezing or shortness of breath. 75 mL 5  . Black Pepper-Turmeric (TURMERIC CURCUMIN) 11-998 MG CAPS     . cefdinir (OMNICEF) 300 MG capsule Take 1 capsule (300 mg total) by mouth 2 (two) times daily for 7 days. 14 capsule 0  . cetirizine (ZYRTEC) 10 MG tablet Take 10 mg by mouth daily.    Marland Kitchen ibuprofen (ADVIL,MOTRIN) 800 MG tablet Take 1 tablet (800 mg total) by mouth every 8 (eight) hours as needed. 60 tablet 1  . methocarbamol (ROBAXIN) 500 MG tablet TAKE 1 TABLET (500 MG TOTAL) BY MOUTH 2 (TWO) TIMES DAILY. TAKE DURING DAYTIME 60 tablet 2  . Naproxen-Esomeprazole 500-20 MG TBEC Take 1 tablet by mouth 2 (two) times daily. 60 tablet 3  . omeprazole (PRILOSEC) 40 MG capsule Take 40 mg by mouth daily.    . ranitidine (ZANTAC) 150 MG tablet Take 150 mg by mouth daily.    . tizanidine (ZANAFLEX) 6 MG capsule Take 1 capsule (6 mg total) by mouth at bedtime. 30 capsule 2  . VENTOLIN HFA 108 (90 Base) MCG/ACT inhaler  INHALE 2 PUFFS INTO THE LUNGS EVERY 2 HOURS AS NEEDED FOR WHEEZING/ SHORTNESS OF BREATH (COUGH). 18 Inhaler 3  . Diclofenac Sodium (PENNSAID) 2 % SOLN Place 1 application onto the skin 2 (two) times daily. (Patient not taking: Reported on 07/04/2018) 1 Bottle 3   No current facility-administered medications for this visit.     REVIEW OF SYSTEMS:    10 Point review of Systems was done is negative except as noted above.  PHYSICAL EXAMINATION:  . Vitals:   07/04/18 1013  BP: (!) 172/90  Pulse: 82  Resp: 18  Temp: 98.9 F (37.2 C)  SpO2: 98%   Filed Weights   07/04/18 1013  Weight: 207 lb (93.9 kg)   .Body mass index is 36.67 kg/m.  GENERAL:alert, in no acute distress and comfortable SKIN: no acute rashes, no significant lesions EYES: conjunctiva are pink and non-injected, sclera anicteric OROPHARYNX: MMM, no exudates, no oropharyngeal erythema or ulceration NECK: supple, no JVD LYMPH:  no palpable lymphadenopathy in the cervical, axillary or inguinal regions LUNGS: clear to auscultation b/l with normal respiratory effort HEART: regular rate & rhythm ABDOMEN:  normoactive bowel sounds , non tender, not distended. Extremity: no pedal edema PSYCH: alert & oriented x 3 with fluent speech NEURO: no focal motor/sensory deficits  LABORATORY DATA:  I have reviewed the data as listed  . CBC Latest Ref Rng & Units 07/04/2018 03/15/2018  WBC 4.0 - 10.5 K/uL 7.0 7.3  Hemoglobin 12.0 - 15.0 g/dL 15.1(H) 14.4  Hematocrit 36.0 - 46.0 % 46.9(H) 43.0  Platelets 150 - 400 K/uL 344 347.0    . CMP Latest Ref Rng & Units 07/04/2018 04/24/2018 03/15/2018  Glucose 70 - 99 mg/dL 98 - 110(H)  BUN 8 - 23 mg/dL 10 - 14  Creatinine 0.44 - 1.00 mg/dL 0.79 - 0.84  Sodium 135 - 145 mmol/L 139 - 139  Potassium 3.5 - 5.1 mmol/L 4.3 - 4.6  Chloride 98 - 111 mmol/L 101 - 100  CO2 22 - 32 mmol/L 27 - 32  Calcium 8.9 - 10.3 mg/dL 10.1 - 10.1  Total Protein 6.5 - 8.1 g/dL 8.2(H) 7.9 -  Total  Bilirubin 0.3 - 1.2 mg/dL 0.8 - -  Alkaline Phos 38 - 126 U/L 116 - -  AST 15 - 41 U/L 25 - -  ALT 0 - 44 U/L 32 - -     RADIOGRAPHIC STUDIES: I have personally reviewed the radiological images as listed and agreed with the findings in the report. Dg Ankle Complete Left  Result Date: 06/18/2018 CLINICAL DATA:  62 year old female. Achilles tendonitis. No known injury. Initial encounter. EXAM: LEFT ANKLE COMPLETE - 3+ VIEW COMPARISON:  09/21/2012. FINDINGS: Marked thickening of the distal Achilles tendon at the level of insertion into the calcaneus where there is associated calcifications/bony fragmentation, spur and possible erosion. Moderate-size plantar spur. Degenerative changes dorsal cuneiform/metatarsal base articulation. Mild pes planus. Ankle mortise intact. IMPRESSION: 1. Marked thickening of the distal Achilles tendon at the level of insertion into the calcaneus where there is associated calcifications/bony fragmentation, spur and possible erosion. 2. Moderate-size plantar spur. Electronically Signed   By: Genia Del M.D.   On: 06/18/2018 12:06   Mm Digital Screening  Result Date: 06/26/2018 CLINICAL DATA:  Screening. EXAM: DIGITAL SCREENING BILATERAL MAMMOGRAM WITH CAD COMPARISON:  None. ACR Breast Density Category b: There are scattered areas of fibroglandular density. FINDINGS: There are no findings suspicious for malignancy. Images were processed with CAD. IMPRESSION: No mammographic evidence of malignancy. A result letter of this screening mammogram will be mailed directly to the patient. RECOMMENDATION: Screening mammogram in one year. (Code:SM-B-01Y) BI-RADS CATEGORY  1: Negative. Electronically Signed   By: Dorise Bullion III M.D   On: 06/26/2018 14:47   Vas [place] Burnard Bunting With/wo Tbi  Result Date: 06/17/2018 LOWER EXTREMITY DOPPLER STUDY Indications: Hands and feet get numb and bluish and painful.  Performing Technologist: Ralene Cork RVT  Examination Guidelines: A complete  evaluation includes at minimum, Doppler waveform signals and systolic blood pressure reading  at the level of bilateral brachial, anterior tibial, and posterior tibial arteries, when vessel segments are accessible. Bilateral testing is considered an integral part of a complete examination. Photoelectric Plethysmograph (PPG) waveforms and toe systolic pressure readings are included as required and additional duplex testing as needed. Limited examinations for reoccurring indications may be performed as noted.  ABI Findings: TOES Findings: +----------+---------------+--------+--------------------------------+ Right ToesPressure (mmHg)WaveformComment                          +----------+---------------+--------+--------------------------------+ 1st Digit                Normal                                   +----------+---------------+--------+--------------------------------+ 1st Digit 170            Normal  after ice water compress on toes +----------+---------------+--------+--------------------------------+ +---------+---------------+--------+--------------------------------+ Left ToesPressure (mmHg)WaveformComment                          +---------+---------------+--------+--------------------------------+ 1st Digit               Normal                                   +---------+---------------+--------+--------------------------------+ 1st [id number]            Normal  after ice water compress on toes +---------+---------------+--------+--------------------------------+   Summary: Right: Great toe waveform and pressure remain normal after ice water compression of toes. Left: Great toe waveform and pressure remain normal after ice water compression of toes.  *See table(s) above for measurements and observations.  Electronically signed by Harold Barban MD on 06/17/2018 at 2:12:43 PM.   Final    Vas Ue Doppler Bilat/comp Tos, Digits (to&ue Reynauds)  Result Date: 06/17/2018 UPPER  EXTREMITY DOPPLER STUDY Indications: Reynauds. History:     Arm numbness.  Performing Technologist: Burley Saver RVT  Examination Guidelines: A complete evaluation includes B-mode imaging, spectral Doppler, color Doppler, and power Doppler as needed of all accessible portions of each vessel. Bilateral testing is considered an integral part of a complete examination. Limited examinations for reoccurring indications may be performed as noted.  Technologist Notes: Right: Digital PPG tracings obtained appear appropriately pulsatile. Left: Digital PPG tracings obtained appear appropriately pulsatile.  Summary:  Right: Normal waveforms noted pre and post warming of digits. Left: Normal waveforms noted pre and post warming of digits. *See table(s) above for measurements and observations. Electronically signed by Harold Barban MD on 06/17/2018 at 3:54:52 PM.    Final    [place] Soft Tissue Non Vascular  Result Date: 06/27/2018 Limited ultrasound: Left Achilles:  Calcaneal heel spurs present at the insertion of the Achilles. Significant increased vascular changes at the insertion but improved from previous scan.  Thickening and mucus changes at the insertion of the Achilles. 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 Labs 1 week prior to scheduled visit   All of the patients questions were answered with apparent satisfaction. The patient knows to call the clinic with any problems, questions or concerns.  The total time spent in the appt was 30 minutes and more than 50% was on counseling and direct patient cares.    Sullivan Lone MD MS AAHIVMS [facility] Hematology/Oncology Physician [facility]  (Office):       [phone] (Work cell):  [phone] (Fax):           [phone]  07/04/2018 11:01 AM  I, Baldwin Jamaica, am acting as a scribe for Dr. Sullivan Lone.   .I have reviewed the above documentation for accuracy and completeness, and I agree with the above. Brunetta Genera MD

## 2018-07-03 NOTE — Therapy (Signed)
[address] [place], [place], [address] Phone: [phone]   Fax:  [phone]  Physical Therapy Treatment  Patient Details  Name: Frances Nelson MRN: [id number] Date of Birth: 02/01/1956 Referring Provider (PT): Renaye Rakers   Encounter Date: 07/03/2018  PT End of Session - 07/03/18 1042    Visit Number  2    Date for PT Re-Evaluation  08/27/18    PT Start Time  0762    PT Stop Time  1100    PT Time Calculation (min)  45 min       Past Medical History:  Diagnosis Date  . Asthma     Past Surgical History:  Procedure Laterality Date  . ABDOMINAL HYSTERECTOMY    . APPENDECTOMY    . HERNIA REPAIR     Umbilical x 3  . TONSILLECTOMY      There were no vitals filed for this visit.  Subjective Assessment - 07/03/18 1039    Subjective  same-just here yesterday    Currently in Pain?  Yes    Pain Score  6                        [facility] Adult PT Treatment/Exercise - 07/03/18 0001      Exercises   Exercises  Ankle      Modalities   Modalities  Electrical Stimulation;Iontophoresis;Cryotherapy;Ultrasound      Cryotherapy   Number Minutes Cryotherapy  10 Minutes    Cryotherapy Location  Knee    Type of Cryotherapy  Ice pack   limited tolerance     Electrical Stimulation   Electrical Stimulation Location  Left knee   IFC Left achilles- unable to tolerate ice   Electrical Stimulation Action  Ipremod    Electrical Stimulation Parameters  supine    Electrical Stimulation Goals  Pain      Ultrasound   Ultrasound Location  left achilles SL    Ultrasound Parameters  1.2 w/cm 2 100% cont    Ultrasound Goals  Pain;Edema   tendon very tender and inflammed     Iontophoresis   Type of Iontophoresis  Dexamethasone    Location  Left achilles    Dose  70mAmin    Time  4 hour patch      Manual Therapy   Manual Therapy  Soft tissue mobilization    Manual therapy comments  pt unable  to tolerate PROM or STW- limited by pain      Ankle Exercises: Supine   T-Band  ankle 4 way red tband               PT Short Term Goals - 07/02/18 1217      PT SHORT TERM GOAL #1   Title  Ind with initial HEP    Time  2    Period  Weeks    Status  New    Target Date  07/16/18      PT SHORT TERM GOAL #2   Title  Patient to report decreased pain in left knee by 50% or more with ADLS    Time  4    Period  Weeks    Status  New    Target Date  07/30/18      PT SHORT TERM GOAL #3   Title  Patient to report decreased pain in left heel by 50% or more with walking    Time  4  Period  Weeks    Status  New      PT SHORT TERM GOAL #4   Title  Patient to demo active left knee flexion of 115 degrees to normalize ADLS.    Time  4    Period  Weeks    Status  New        PT Long Term Goals - 07/02/18 1219      PT LONG TERM GOAL #1   Title  Patient able to ambulate for 30 min with 3/10 pain or less in the heel.    Time  8    Status  New    Target Date  08/27/18      PT LONG TERM GOAL #2   Title  Patient to report decreased knee pain to 1/10 or less with ADLS.    Time  8    Period  Weeks    Status  New      PT LONG TERM GOAL #3   Title  Patient to demo 4 to 4+/5 strength in the left hip/knee and ankle to improve function.    Time  8    Period  Weeks    Status  New      PT LONG TERM GOAL #4   Title  Patient to demo functional left ankle ROM to help normalize gait and other ADLS.    Time  8    Period  Weeks    Status  New      PT LONG TERM GOAL #5   Title  Patient able to climb stairs with reciprocal gait pattern.    Time  8    Status  New            Plan - 07/03/18 1043    Clinical Impression Statement  pt amb in with antalgic gait- pain from left buttock down left leg. limited to no tolerance to MT. Left achilles very swollen and inflammed.Focus on modalities for pain and inflammation.    PT Treatment/Interventions  ADLs/Self Care Home  Management;Electrical Stimulation;Cryotherapy;Iontophoresis 4mg /ml Dexamethasone;Moist Heat;Ultrasound;Therapeutic exercise;Neuromuscular re-education;Patient/family education;Manual techniques;Dry needling;Taping;Vasopneumatic Device    PT Next Visit Plan  work on pain control and try gentle stretching/strengthening; manual prn to L quads/ITB, patellar mobility       Patient will benefit from skilled therapeutic intervention in order to improve the following deficits and impairments:  Abnormal gait, Pain, Decreased mobility, Decreased activity tolerance, Decreased range of motion, Decreased strength, Impaired flexibility, Increased edema  Visit Diagnosis: Pain in left ankle and joints of left foot  Chronic pain of left knee  Stiffness of left ankle, not elsewhere classified  Stiffness of left knee, not elsewhere classified     Problem List Patient Active Problem List   Diagnosis Date Noted  . Arthralgia 06/28/2018  . Sinusitis 06/28/2018  . Renal cyst 05/03/2018  . Syncope 05/03/2018  . Stress and adjustment reaction 05/03/2018  . History of tobacco use 05/02/2018  . Primary osteoarthritis of both hands 04/10/2018  . Primary osteoarthritis of both knees 04/10/2018  . Primary osteoarthritis of both feet 04/10/2018  . Achilles tendinitis 04/02/2018  . Screening for breast cancer 03/15/2018  . Positive ANA (antinuclear antibody) 03/15/2018  . Screening for colon cancer 03/15/2018  . Rash 03/15/2018  . Achilles tendon pain 11/13/2017  . History of systemic lupus erythematosus (SLE) ([place]) 11/13/2017  . Cervical myelopathy with cervical radiculopathy 11/13/2017  . Elevated blood pressure reading without diagnosis of hypertension 11/13/2017  . 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 Allergic rhinitis 06/19/2017    [place] Ganoe,ANGIE PTA 07/03/2018, 10:44 AM  [address] [place], [place], [address] Phone:  [phone]   Fax:  [phone]  Name: Frances Nelson MRN: [id number] Date of Birth: September 08, 1955

## 2018-07-04 ENCOUNTER — Encounter: Payer: Self-pay | Admitting: Hematology

## 2018-07-04 ENCOUNTER — Other Ambulatory Visit: Payer: Self-pay

## 2018-07-04 ENCOUNTER — Inpatient Hospital Stay: Payer: BLUE CROSS/BLUE SHIELD

## 2018-07-04 ENCOUNTER — Inpatient Hospital Stay: Payer: BLUE CROSS/BLUE SHIELD | Attending: Hematology | Admitting: Hematology

## 2018-07-04 ENCOUNTER — Telehealth: Payer: Self-pay

## 2018-07-04 VITALS — BP 172/90 | HR 82 | Temp 98.9°F | Resp 18 | Ht 63.0 in | Wt 207.0 lb

## 2018-07-04 DIAGNOSIS — M545 Low back pain: Secondary | ICD-10-CM | POA: Diagnosis not present

## 2018-07-04 DIAGNOSIS — D472 Monoclonal gammopathy: Secondary | ICD-10-CM | POA: Insufficient documentation

## 2018-07-04 DIAGNOSIS — Z87891 Personal history of nicotine dependence: Secondary | ICD-10-CM | POA: Insufficient documentation

## 2018-07-04 DIAGNOSIS — M2550 Pain in unspecified joint: Secondary | ICD-10-CM

## 2018-07-04 DIAGNOSIS — Z79899 Other long term (current) drug therapy: Secondary | ICD-10-CM | POA: Insufficient documentation

## 2018-07-04 LAB — CMP (CANCER CENTER ONLY)
ALT: 32 U/L (ref 0–44)
AST: 25 U/L (ref 15–41)
Albumin: 4.2 g/dL (ref 3.5–5.0)
Alkaline Phosphatase: 116 U/L (ref 38–126)
Anion gap: 11 (ref 5–15)
BUN: 10 mg/dL (ref 8–23)
CO2: 27 mmol/L (ref 22–32)
Calcium: 10.1 mg/dL (ref 8.9–10.3)
Chloride: 101 mmol/L (ref 98–111)
Creatinine: 0.79 mg/dL (ref 0.44–1.00)
GFR, Est AFR Am: >60 mL/min (ref >60)
GFR, Estimated: >60 mL/min (ref >60)
Glucose, Bld: 98 mg/dL (ref 70–99)
Potassium: 4.3 mmol/L (ref 3.5–5.1)
Sodium: 139 mmol/L (ref 135–145)
Total Bilirubin: 0.8 mg/dL (ref 0.3–1.2)
Total Protein: 8.2 g/dL — ABNORMAL HIGH (ref 6.5–8.1)

## 2018-07-04 LAB — CBC WITH DIFFERENTIAL/PLATELET
Abs Immature Granulocytes: 0.01 10*3/uL (ref 0.00–0.07)
Basophils Absolute: 0 10*3/uL (ref 0.0–0.1)
Basophils Relative: 1 %
Eosinophils Absolute: 0.1 10*3/uL (ref 0.0–0.5)
Eosinophils Relative: 2 %
HCT: 46.9 % — ABNORMAL HIGH (ref 36.0–46.0)
Hemoglobin: 15.1 g/dL — ABNORMAL HIGH (ref 12.0–15.0)
Immature Granulocytes: 0 %
Lymphocytes Relative: 20 %
Lymphs Abs: 1.4 10*3/uL (ref 0.7–4.0)
MCH: 29.5 pg (ref 26.0–34.0)
MCHC: 32.2 g/dL (ref 30.0–36.0)
MCV: 91.6 fL (ref 80.0–100.0)
Monocytes Absolute: 0.4 10*3/uL (ref 0.1–1.0)
Monocytes Relative: 6 %
Neutro Abs: 4.9 10*3/uL (ref 1.7–7.7)
Neutrophils Relative %: 71 %
Platelets: 344 10*3/uL (ref 150–400)
RBC: 5.12 MIL/uL — ABNORMAL HIGH (ref 3.87–5.11)
RDW: 12.3 % (ref 11.5–15.5)
WBC: 7 10*3/uL (ref 4.0–10.5)
nRBC: 0 % (ref 0.0–0.2)

## 2018-07-04 LAB — SEDIMENTATION RATE: Sed Rate: 6 mm/hr (ref 0–22)

## 2018-07-04 NOTE — Telephone Encounter (Signed)
Printed avs and calender of upcoming appointment. Per 12/19 los 

## 2018-07-05 LAB — BETA 2 MICROGLOBULIN, SERUM: Beta-2 Microglobulin: 1.5 mg/L (ref 0.6–2.4)

## 2018-07-05 LAB — KAPPA/LAMBDA LIGHT CHAINS
Kappa free light chain: 17.2 mg/L (ref 3.3–19.4)
Kappa, lambda light chain ratio: 1.13 (ref 0.26–1.65)
Lambda free light chains: 15.2 mg/L (ref 5.7–26.3)

## 2018-07-08 DIAGNOSIS — R5383 Other fatigue: Secondary | ICD-10-CM | POA: Diagnosis not present

## 2018-07-08 DIAGNOSIS — G8929 Other chronic pain: Secondary | ICD-10-CM | POA: Diagnosis not present

## 2018-07-08 DIAGNOSIS — M545 Low back pain: Secondary | ICD-10-CM | POA: Diagnosis not present

## 2018-07-08 DIAGNOSIS — M2550 Pain in unspecified joint: Secondary | ICD-10-CM | POA: Diagnosis not present

## 2018-07-08 LAB — MULTIPLE MYELOMA PANEL, SERUM
Albumin SerPl Elph-Mcnc: 4 g/dL (ref 2.9–4.4)
Albumin/Glob SerPl: 1.2 (ref 0.7–1.7)
Alpha 1: 0.2 g/dL (ref 0.0–0.4)
Alpha2 Glob SerPl Elph-Mcnc: 0.9 g/dL (ref 0.4–1.0)
B-Globulin SerPl Elph-Mcnc: 1.2 g/dL (ref 0.7–1.3)
Gamma Glob SerPl Elph-Mcnc: 1.2 g/dL (ref 0.4–1.8)
Globulin, Total: 3.5 g/dL (ref 2.2–3.9)
IgA: 158 mg/dL (ref 87–352)
IgG (Immunoglobin G), Serum: 1208 mg/dL (ref 700–1600)
IgM (Immunoglobulin M), Srm: 95 mg/dL (ref 26–217)
Total Protein ELP: 7.5 g/dL (ref 6.0–8.5)

## 2018-07-11 ENCOUNTER — Ambulatory Visit: Payer: BLUE CROSS/BLUE SHIELD | Admitting: Physical Therapy

## 2018-07-11 DIAGNOSIS — G8929 Other chronic pain: Secondary | ICD-10-CM | POA: Diagnosis not present

## 2018-07-11 DIAGNOSIS — Z1211 Encounter for screening for malignant neoplasm of colon: Secondary | ICD-10-CM | POA: Diagnosis not present

## 2018-07-11 DIAGNOSIS — M25672 Stiffness of left ankle, not elsewhere classified: Secondary | ICD-10-CM

## 2018-07-11 DIAGNOSIS — M25572 Pain in left ankle and joints of left foot: Secondary | ICD-10-CM | POA: Diagnosis not present

## 2018-07-11 DIAGNOSIS — M25662 Stiffness of left knee, not elsewhere classified: Secondary | ICD-10-CM

## 2018-07-11 DIAGNOSIS — R2689 Other abnormalities of gait and mobility: Secondary | ICD-10-CM | POA: Diagnosis not present

## 2018-07-11 DIAGNOSIS — Z1212 Encounter for screening for malignant neoplasm of rectum: Secondary | ICD-10-CM | POA: Diagnosis not present

## 2018-07-11 DIAGNOSIS — M25562 Pain in left knee: Secondary | ICD-10-CM

## 2018-07-11 NOTE — Therapy (Signed)
[address] [place], [place], [address] Phone: [phone]   Fax:  [phone]  Physical Therapy Treatment  Patient Details  Name: Frances Nelson MRN: [id number] Date of Birth: 15-Jun-1956 Referring Provider (PT): Renaye Rakers   Encounter Date: 07/11/2018  PT End of Session - 07/11/18 1518    Visit Number  3    Date for PT Re-Evaluation  08/27/18    PT Start Time  1448    PT Stop Time  1530    PT Time Calculation (min)  42 min       Past Medical History:  Diagnosis Date  . Asthma     Past Surgical History:  Procedure Laterality Date  . ABDOMINAL HYSTERECTOMY    . APPENDECTOMY    . HERNIA REPAIR     Umbilical x 3  . TONSILLECTOMY      There were no vitals filed for this visit.  Subjective Assessment - 07/11/18 1512    Subjective  foot broke out in blisters from ionto patch. maybe some better- I can feel toes and move foot more    Currently in Pain?  Yes    Pain Score  6     Pain Location  Foot         [facility] PT Assessment - 07/11/18 0001      AROM   Right/Left Ankle  Left    Left Ankle Dorsiflexion  8                   [facility] Adult PT Treatment/Exercise - 07/11/18 0001      Modalities   Modalities  Electrical Stimulation;Ultrasound;Moist Heat      Electrical Stimulation   Electrical Stimulation Location  Left knee   left ankle   Electrical Stimulation Action  IFC    Electrical Stimulation Parameters  supine    Electrical Stimulation Goals  Pain;Edema      Ultrasound   Ultrasound Location  left achilles    Ultrasound Parameters  1.2 w/cm@ 100% cont    Ultrasound Goals  Pain;Edema      Manual Therapy   Manual Therapy  Soft tissue mobilization;Passive ROM    Soft tissue mobilization  left achilles and foot,into callf    Passive ROM  left foot/ankle   left hip and knee              PT Short Term Goals - 07/11/18 1518      PT SHORT TERM GOAL #1   Title  Ind with initial HEP    Status  Achieved      PT SHORT TERM GOAL #2   Title  Patient to report decreased pain in left knee by 50% or more with ADLS    Status  On-going      PT SHORT TERM GOAL #3   Title  Patient to report decreased pain in left heel by 50% or more with walking    Status  On-going      PT SHORT TERM GOAL #4   Title  Patient to demo active left knee flexion of 115 degrees to normalize ADLS.    Status  On-going        PT Long Term Goals - 07/02/18 1219      PT LONG TERM GOAL #1   Title  Patient able to ambulate for 30 min with 3/10 pain or less in the heel.    Time  8  Status  New    Target Date  08/27/18      PT LONG TERM GOAL #2   Title  Patient to report decreased knee pain to 1/10 or less with ADLS.    Time  8    Period  Weeks    Status  New      PT LONG TERM GOAL #3   Title  Patient to demo 4 to 4+/5 strength in the left hip/knee and ankle to improve function.    Time  8    Period  Weeks    Status  New      PT LONG TERM GOAL #4   Title  Patient to demo functional left ankle ROM to help normalize gait and other ADLS.    Time  8    Period  Weeks    Status  New      PT LONG TERM GOAL #5   Title  Patient able to climb stairs with reciprocal gait pattern.    Time  8    Status  New            Plan - 07/11/18 1519    Clinical Impression Statement  pt broke out from ionto and after discussion to try KT tape but history of tape break out so declined. improved Act DF and progressing with goals.  increased tolerance to STW and PROM.    PT Treatment/Interventions  ADLs/Self Care Home Management;Electrical Stimulation;Cryotherapy;Iontophoresis 4mg /ml Dexamethasone;Moist Heat;Ultrasound;Therapeutic exercise;Neuromuscular re-education;Patient/family education;Manual techniques;Dry needling;Taping;Vasopneumatic Device    PT Next Visit Plan  work on pain control and try gentle stretching/strengthening; manual prn to L quads/ITB, patellar mobility        Patient will benefit from skilled therapeutic intervention in order to improve the following deficits and impairments:  Abnormal gait, Pain, Decreased mobility, Decreased activity tolerance, Decreased range of motion, Decreased strength, Impaired flexibility, Increased edema  Visit Diagnosis: Pain in left ankle and joints of left foot  Chronic pain of left knee  Stiffness of left ankle, not elsewhere classified  Stiffness of left knee, not elsewhere classified     Problem List Patient Active Problem List   Diagnosis Date Noted  . Arthralgia 06/28/2018  . Sinusitis 06/28/2018  . Renal cyst 05/03/2018  . Syncope 05/03/2018  . Stress and adjustment reaction 05/03/2018  . History of tobacco use 05/02/2018  . Primary osteoarthritis of both hands 04/10/2018  . Primary osteoarthritis of both knees 04/10/2018  . Primary osteoarthritis of both feet 04/10/2018  . Achilles tendinitis 04/02/2018  . Screening for breast cancer 03/15/2018  . Positive ANA (antinuclear antibody) 03/15/2018  . Screening for colon cancer 03/15/2018  . Rash 03/15/2018  . Achilles tendon pain 11/13/2017  . History of systemic lupus erythematosus (SLE) ([place]) 11/13/2017  . Cervical myelopathy with cervical radiculopathy 11/13/2017  . Elevated blood pressure reading without diagnosis of hypertension 11/13/2017  . COPD with asthma ([place]) 06/19/2017  . Allergic rhinitis 06/19/2017    Nikai Quest,ANGIE PTA 07/11/2018, 3:22 PM  [address] [place], [place], [address] Phone: [phone]   Fax:  [phone]  Name: Kiya Eno MRN: [id number] Date of Birth: April 11, 1956

## 2018-07-15 ENCOUNTER — Encounter: Payer: Self-pay | Admitting: Physical Therapy

## 2018-07-15 ENCOUNTER — Ambulatory Visit: Payer: BLUE CROSS/BLUE SHIELD | Admitting: Family Medicine

## 2018-07-15 ENCOUNTER — Ambulatory Visit: Payer: BLUE CROSS/BLUE SHIELD | Admitting: Physical Therapy

## 2018-07-15 ENCOUNTER — Encounter: Payer: Self-pay | Admitting: Family Medicine

## 2018-07-15 DIAGNOSIS — M25562 Pain in left knee: Secondary | ICD-10-CM | POA: Diagnosis not present

## 2018-07-15 DIAGNOSIS — M7662 Achilles tendinitis, left leg: Secondary | ICD-10-CM | POA: Diagnosis not present

## 2018-07-15 DIAGNOSIS — M25662 Stiffness of left knee, not elsewhere classified: Secondary | ICD-10-CM

## 2018-07-15 DIAGNOSIS — R2689 Other abnormalities of gait and mobility: Secondary | ICD-10-CM | POA: Diagnosis not present

## 2018-07-15 DIAGNOSIS — M25672 Stiffness of left ankle, not elsewhere classified: Secondary | ICD-10-CM | POA: Diagnosis not present

## 2018-07-15 DIAGNOSIS — G8929 Other chronic pain: Secondary | ICD-10-CM | POA: Diagnosis not present

## 2018-07-15 DIAGNOSIS — M25572 Pain in left ankle and joints of left foot: Secondary | ICD-10-CM

## 2018-07-15 LAB — COLOGUARD

## 2018-07-15 MED ORDER — NAPROXEN-ESOMEPRAZOLE 500-20 MG PO TBEC: 1.0000 | DELAYED_RELEASE_TABLET | Freq: Two times a day (BID) | ORAL | 3 refills | Status: DC

## 2018-07-15 MED ORDER — PREDNISONE 5 MG PO TABS: ORAL_TABLET | ORAL | 0 refills | Status: DC

## 2018-07-15 MED ORDER — VITAMIN D (ERGOCALCIFEROL) 1.25 MG (50000 UNIT) PO CAPS: 50000.0000 [IU] | ORAL_CAPSULE | ORAL | 0 refills | Status: DC

## 2018-07-15 NOTE — Assessment & Plan Note (Signed)
Symptoms have been ongoing for at least 9 to 10 months.  She was intolerant to nitro patches.  Has tried the boot with some improvement.  Have tried prednisone and other anti-inflammatories with mild improvement.  Has recently begun physical therapy but will lose her insurance coverage.  Lab work is been relatively unrevealing for a systemic issue. -Try prednisone. -Provided Vimovo as she did get some improvement. -Initiate vitamin D

## 2018-07-15 NOTE — Patient Instructions (Addendum)
Good to see you  Please try the prednisone  You can take the vimovo after completing the prednisone  Please try the vitamin d  Please continue the exercises and compression  [facility] [address], [place] [address]  Phone: [phone]  You could try the fellowship program as it might be a cheaper option.

## 2018-07-15 NOTE — Progress Notes (Signed)
Frances Nelson - 62 y.o. female MRN [id number]  Date of birth: [birth year]  SUBJECTIVE:  Including CC & ROS.  Chief Complaint  Patient presents with  . Follow-up    left achilles tendon , worse past 2 days w/ weather changes    Frances Nelson is a 62 y.o. female that is following up for her ongoing Achilles tendinitis.  She has had mild improvement since starting physical therapy.  Unfortunately her insurance will end at the end of the year.  She did have a reaction to the pads for iontophoresis at physical therapy.  She feels some improvement in the swelling but overall the pain is been ongoing.  She has run out of the Vimovo and felt like that has helped the most.  She denies any significant improvement with medications.  Has been to the rheumatologist and oncologist.     Review of Systems  Constitutional: Negative for fever.  HENT: Negative for congestion.   Respiratory: Negative for cough.   Cardiovascular: Negative for chest pain.  Gastrointestinal: Negative for abdominal pain.  Musculoskeletal: Positive for arthralgias and gait problem.  Skin: Negative for color change.  Neurological: Negative for weakness.  Hematological: Negative for adenopathy.  Psychiatric/Behavioral: Negative for agitation.    HISTORY: Past Medical, Surgical, Social, and Family History Reviewed & Updated per EMR.   Pertinent Historical Findings include:  Past Medical History:  Diagnosis Date  . Asthma     Past Surgical History:  Procedure Laterality Date  . ABDOMINAL HYSTERECTOMY    . APPENDECTOMY    . HERNIA REPAIR     Umbilical x 3  . TONSILLECTOMY      Allergies  Allergen Reactions  . Symbicort [Budesonide-Formoterol Fumarate] Other (See Comments)    Patient reported dizziness, nausea, headaches and sore throat while using Symbicort 160. Reported on 09/03/17  . Celebrex [Celecoxib]     Bleeding.    . Chocolate Flavor   . Ciprofloxacin Nausea And Vomiting    Headache, shaking.   . Eggs  Or Egg-Derived Products   . Flavoring Agent     Unknown  . Gabapentin   . Lyrica [Pregabalin]     "Made me out of it."   . Tramadol   . Wellbutrin [Bupropion]   . Codeine Palpitations    Family History  Problem Relation Age of Onset  . Leukemia Sister   . Brain cancer Brother        glioblastoma   . Asthma Son   . Allergies Son      Social History   Socioeconomic History  . Marital status: Divorced    Spouse name: Not on file  . Number of children: Not on file  . Years of education: Not on file  . Highest education level: Not on file  Occupational History  . Not on file  Social Needs  . Financial resource strain: Not on file  . Food insecurity:    Worry: Not on file    Inability: Not on file  . Transportation needs:    Medical: Not on file    Non-medical: Not on file  Tobacco Use  . Smoking status: Former Smoker    Packs/day: 0.75    Years: 40.00    Pack years: 30.00    Types: Cigarettes    Last attempt to quit: 11/14/2017    Years since quitting: 0.6  . Smokeless tobacco: Never Used  Substance and Sexual Activity  . Alcohol use: No  . Drug use: Never  .  Sexual activity: Not on file  Lifestyle  . Physical activity:    Days per week: Not on file    Minutes per session: Not on file  . Stress: Not on file  Relationships  . Social connections:    Talks on phone: Not on file    Gets together: Not on file    Attends religious service: Not on file    Active member of club or organization: Not on file    Attends meetings of clubs or organizations: Not on file    Relationship status: Not on file  . Intimate partner violence:    Fear of current or ex partner: Not on file    Emotionally abused: Not on file    Physically abused: Not on file    Forced sexual activity: Not on file  Other Topics Concern  . Not on file  Social History Narrative  . Not on file     PHYSICAL EXAM:  VS: BP 120/90 (BP Location: Left Arm, Patient Position: Sitting, Cuff Size:  Normal)   Pulse 91   Temp 98.1 F (36.7 C) (Oral)   Ht 5\' 3"  (1.6 m)   Wt 207 lb (93.9 kg)   SpO2 96%   BMI 36.67 kg/m  Physical Exam Gen: NAD, alert, cooperative with exam, well-appearing ENT: normal lips, normal nasal mucosa,  Eye: normal EOM, normal conjunctiva and lids CV:  no edema, +2 pedal pulses   Resp: no accessory muscle use, non-labored,  Skin: no rashes, no areas of induration  Neuro: normal tone, normal sensation to touch Psych:  normal insight, alert and oriented MSK:  Left ankle/Achilles: Haglund's deformity still present but it seems to be smaller than before. Some tenderness to palpation at the insertion of the Achilles. Normal plantar flexion. Pain with dorsiflexion. Neurovascularly intact     ASSESSMENT & PLAN:   Achilles tendinitis Symptoms have been ongoing for at least 9 to 10 months.  She was intolerant to nitro patches.  Has tried the boot with some improvement.  Have tried prednisone and other anti-inflammatories with mild improvement.  Has recently begun physical therapy but will lose her insurance coverage.  Lab work is been relatively unrevealing for a systemic issue. -Try prednisone. -Provided Vimovo as she did get some improvement. -Initiate vitamin D

## 2018-07-15 NOTE — Therapy (Signed)
[address] [place], [place], [address] Phone: [phone]   Fax:  [phone]  Physical Therapy Treatment  Patient Details  Name: Frances Nelson MRN: [id number] Date of Birth: [birth year] Referring Provider (PT): Renaye Rakers   Encounter Date: 07/15/2018  PT End of Session - 07/15/18 1143    Visit Number  4    Date for PT Re-Evaluation  08/27/18    PT Start Time  1053    PT Stop Time  1155    PT Time Calculation (min)  62 min    Activity Tolerance  Patient limited by pain    Behavior During Therapy  [facility] for tasks assessed/performed       Past Medical History:  Diagnosis Date  . Asthma     Past Surgical History:  Procedure Laterality Date  . ABDOMINAL HYSTERECTOMY    . APPENDECTOMY    . HERNIA REPAIR     Umbilical x 3  . TONSILLECTOMY      There were no vitals filed for this visit.  Subjective Assessment - 07/15/18 1058    Subjective  Patient continues to have pain, she had the little blisters that came up after the ionto patch.  There is no carryover of that now.  Saw MD this AM he wants her to continue    Currently in Pain?  Yes    Pain Score  5     Pain Location  Ankle   left achilles   Pain Orientation  Left    Aggravating Factors   standing and walking    Pain Relieving Factors  [place] "may help a little"                       [facility] Adult PT Treatment/Exercise - 07/15/18 0001      Electrical Stimulation   Electrical Stimulation Location  left knee, left achilles    Electrical Stimulation Action  pre mod    Electrical Stimulation Parameters  supine and elevated    Electrical Stimulation Goals  Pain;Edema      Ultrasound   Ultrasound Location  left achilles and left patellar area    Ultrasound Parameters  1.3w/cm2 100%    Ultrasound Goals  Pain;Edema      Manual Therapy   Manual Therapy  Soft tissue mobilization;Passive ROM    Soft tissue mobilization  left  achilles and foot,into callf    Passive ROM  left foot/ankle      Ankle Exercises: Stretches   Soleus Stretch  3 reps;20 seconds    Gastroc Stretch  3 reps;20 seconds      Ankle Exercises: Aerobic   Stationary Bike  5 minutes               PT Short Term Goals - 07/11/18 1518      PT SHORT TERM GOAL #1   Title  Ind with initial HEP    Status  Achieved      PT SHORT TERM GOAL #2   Title  Patient to report decreased pain in left knee by 50% or more with ADLS    Status  On-going      PT SHORT TERM GOAL #3   Title  Patient to report decreased pain in left heel by 50% or more with walking    Status  On-going      PT SHORT TERM GOAL #4   Title  Patient to demo active  left knee flexion of 115 degrees to normalize ADLS.    Status  On-going        PT Long Term Goals - 07/15/18 1145      PT LONG TERM GOAL #1   Title  Patient able to ambulate for 30 min with 3/10 pain or less in the heel.    Status  On-going      PT LONG TERM GOAL #2   Title  Patient to report decreased knee pain to 1/10 or less with ADLS.    Status  On-going            Plan - 07/15/18 1144    Clinical Impression Statement  Patient has significant antalgic gait on the left, with c/o left knee and achilles pain.  She has pain with most activities.  Tried to add some activities and STM to see if we could help with the pain, the irritation from tape and ionto are limiting the modalities that we can try    PT Next Visit Plan  work on pain control and try gentle stretching/strengthening; manual prn to L quads/ITB, patellar mobility    Consulted and Agree with Plan of Care  Patient       Patient will benefit from skilled therapeutic intervention in order to improve the following deficits and impairments:  Abnormal gait, Pain, Decreased mobility, Decreased activity tolerance, Decreased range of motion, Decreased strength, Impaired flexibility, Increased edema  Visit Diagnosis: Pain in left ankle and  joints of left foot  Chronic pain of left knee  Stiffness of left ankle, not elsewhere classified  Stiffness of left knee, not elsewhere classified  Other abnormalities of gait and mobility     Problem List Patient Active Problem List   Diagnosis Date Noted  . Arthralgia 06/28/2018  . Sinusitis 06/28/2018  . Renal cyst 05/03/2018  . Syncope 05/03/2018  . Stress and adjustment reaction 05/03/2018  . History of tobacco use 05/02/2018  . Primary osteoarthritis of both hands 04/10/2018  . Primary osteoarthritis of both knees 04/10/2018  . Primary osteoarthritis of both feet 04/10/2018  . Achilles tendinitis 04/02/2018  . Screening for breast cancer 03/15/2018  . Positive ANA (antinuclear antibody) 03/15/2018  . Screening for colon cancer 03/15/2018  . Rash 03/15/2018  . Achilles tendon pain 11/13/2017  . History of systemic lupus erythematosus (SLE) ([place]) 11/13/2017  . Cervical myelopathy with cervical radiculopathy 11/13/2017  . Elevated blood pressure reading without diagnosis of hypertension 11/13/2017  . COPD with asthma ([place]) 06/19/2017  . Allergic rhinitis 06/19/2017    Sumner Boast., PT 07/15/2018, 11:46 AM  [address] [place], [place], [address] Phone: [phone]   Fax:  [phone]  Name: Frances Nelson MRN: [id number] Date of Birth: 1956/04/19

## 2018-08-14 ENCOUNTER — Other Ambulatory Visit: Payer: BLUE CROSS/BLUE SHIELD | Admitting: Rheumatology

## 2018-09-11 ENCOUNTER — Encounter: Payer: Self-pay | Admitting: Family Medicine

## 2018-09-26 ENCOUNTER — Ambulatory Visit: Payer: BLUE CROSS/BLUE SHIELD | Admitting: Rheumatology

## 2018-10-22 ENCOUNTER — Encounter: Payer: Self-pay | Admitting: Family Medicine

## 2019-01-02 ENCOUNTER — Telehealth: Payer: Self-pay | Admitting: *Deleted

## 2019-01-02 ENCOUNTER — Other Ambulatory Visit: Payer: BLUE CROSS/BLUE SHIELD

## 2019-01-02 NOTE — Telephone Encounter (Signed)
Patient asked to cancel lab appt on 6/18 and appt w/Dr.Kale 6/25. States she is currently unemployed and has no insurance so cannot afford the appointment. Gave patient information about [facility], including phone# and address. Encouraged patient to contact them regarding current healthcare needs. Patient expressed appreciation and verbalized understanding.

## 2019-01-09 ENCOUNTER — Ambulatory Visit: Payer: BLUE CROSS/BLUE SHIELD | Admitting: Hematology

## 2019-03-06 ENCOUNTER — Encounter: Payer: Self-pay | Admitting: Family Medicine

## 2019-03-06 NOTE — Telephone Encounter (Signed)
[facility] for [employer]

## 2019-04-21 MED FILL — ALBUTEROL 0.083 MG/ML SOLN: (2.5 MG/3ML | 4 days supply | Qty: 90 | Fill #0

## 2019-09-03 ENCOUNTER — Telehealth (INDEPENDENT_AMBULATORY_CARE_PROVIDER_SITE_OTHER): Payer: 59 | Admitting: Family Medicine

## 2019-09-03 ENCOUNTER — Encounter: Payer: Self-pay | Admitting: Family Medicine

## 2019-09-03 VITALS — BP 183/108 | Ht 63.0 in

## 2019-09-03 DIAGNOSIS — J449 Chronic obstructive pulmonary disease, unspecified: Secondary | ICD-10-CM

## 2019-09-03 DIAGNOSIS — R030 Elevated blood-pressure reading, without diagnosis of hypertension: Secondary | ICD-10-CM

## 2019-09-03 DIAGNOSIS — M329 Systemic lupus erythematosus, unspecified: Secondary | ICD-10-CM

## 2019-09-03 MED ORDER — ALBUTEROL SULFATE (2.5 MG/3ML) 0.083% IN NEBU: 2.5000 mg | INHALATION_SOLUTION | Freq: Four times a day (QID) | RESPIRATORY_TRACT | 0 refills | Status: DC | PRN

## 2019-09-03 MED FILL — ALBUTEROL 0.083 MG/ML SOLN: (2.5 MG/3ML | 15 days supply | Qty: 180 | Fill #0

## 2019-09-03 NOTE — Progress Notes (Signed)
Established Patient Office Visit  Subjective:  Patient ID: Frances Nelson, female    DOB: 1955/10/01  Age: 64 y.o. MRN: [id number]  CC:  Chief Complaint  Patient presents with  . Follow-up    refill on medications, feet pain, left leg pain, mid back pains symptoms x 1 week.      HPI Frances Nelson presents for evaluation and treatment for multiple aches and pains paresthesias and weakness about her body.  Her blood pressure has been running between 1 [id number] 80 systolic fully over [id number] diastolically.  She has been experiencing headaches.  Denies difficulty swallowing but has noted some weakness on the left side of her body.  She has been lost to follow-up secondary to losing her insurance.  She has a history of SLE.  She is under work-up by oncology for a blood dyscrasia.  She has a history of COPD associated with asthmatic bronchitis.  She is using a nebulizer treatment nightly.  She requests a refill.  Past Medical History:  Diagnosis Date  . Asthma     Past Surgical History:  Procedure Laterality Date  . ABDOMINAL HYSTERECTOMY    . APPENDECTOMY    . HERNIA REPAIR     Umbilical x 3  . TONSILLECTOMY      Family History  Problem Relation Age of Onset  . Leukemia Sister   . Brain cancer Brother        glioblastoma   . Asthma Son   . Allergies Son     Social History   Socioeconomic History  . Marital status: Divorced    Spouse name: Not on file  . Number of children: Not on file  . Years of education: Not on file  . Highest education level: Not on file  Occupational History  . Not on file  Tobacco Use  . Smoking status: Former Smoker    Packs/day: 0.75    Years: 40.00    Pack years: 30.00    Types: Cigarettes    Quit date: 11/14/2017    Years since quitting: 1.8  . Smokeless tobacco: Never Used  Substance and Sexual Activity  . Alcohol use: No  . Drug use: Never  . Sexual activity: Not on file  Other Topics Concern  . Not on file  Social History Narrative   . Not on file   Social Determinants of Health   Financial Resource Strain:   . Difficulty of Paying Living Expenses: Not on file  Food Insecurity:   . Worried About Charity fundraiser in the Last Year: Not on file  . Ran Out of Food in the Last Year: Not on file  Transportation Needs:   . Lack of Transportation (Medical): Not on file  . Lack of Transportation (Non-Medical): Not on file  Physical Activity:   . Days of Exercise per Week: Not on file  . Minutes of Exercise per Session: Not on file  Stress:   . Feeling of Stress : Not on file  Social Connections:   . Frequency of Communication with Friends and Family: Not on file  . Frequency of Social Gatherings with Friends and Family: Not on file  . Attends Religious Services: Not on file  . Active Member of Clubs or Organizations: Not on file  . Attends Archivist Meetings: Not on file  . Marital Status: Not on file  Intimate Partner Violence:   . Fear of Current or Ex-Partner: Not on file  . Emotionally Abused: Not on  file  . Physically Abused: Not on file  . Sexually Abused: Not on file    Outpatient Medications Prior to Visit  Medication Sig Dispense Refill  . cetirizine (ZYRTEC) 10 MG tablet Take 10 mg by mouth daily.    . Diclofenac Sodium (PENNSAID) 2 % SOLN Place 1 application onto the skin 2 (two) times daily. 1 Bottle 3  . ibuprofen (ADVIL,MOTRIN) 800 MG tablet Take 1 tablet (800 mg total) by mouth every 8 (eight) hours as needed. 60 tablet 1  . methocarbamol (ROBAXIN) 500 MG tablet TAKE 1 TABLET (500 MG TOTAL) BY MOUTH 2 (TWO) TIMES DAILY. TAKE DURING DAYTIME 60 tablet 2  . Naproxen-Esomeprazole 500-20 MG TBEC Take 1 tablet by mouth 2 (two) times daily. 60 tablet 3  . omeprazole (PRILOSEC) 40 MG capsule Take 40 mg by mouth daily.    . tizanidine (ZANAFLEX) 6 MG capsule Take 1 capsule (6 mg total) by mouth at bedtime. 30 capsule 2  . albuterol (PROVENTIL) (2.5 MG/3ML) 0.083% nebulizer solution Take 3 mLs  (2.5 mg total) by nebulization every 4 (four) hours as needed for wheezing or shortness of breath. 75 mL 5  . VENTOLIN HFA 108 (90 Base) MCG/ACT inhaler INHALE 2 PUFFS INTO THE LUNGS EVERY 2 HOURS AS NEEDED FOR WHEEZING/ SHORTNESS OF BREATH (COUGH). 18 Inhaler 3  . Black Pepper-Turmeric (TURMERIC CURCUMIN) 11-998 MG CAPS     . predniSONE (DELTASONE) 5 MG tablet Take 6 pills for first day, 5 pills second day, 4 pills third day, 3 pills fourth day, 2 pills the fifth day, and 1 pill sixth day. 21 tablet 0  . ranitidine (ZANTAC) 150 MG tablet Take 150 mg by mouth daily.    . Vitamin D, Ergocalciferol, (DRISDOL) 1.25 MG (50000 UT) CAPS capsule Take 1 capsule (50,000 Units total) by mouth every 7 (seven) days. Take for 8 total doses(weeks) 8 capsule 0   No facility-administered medications prior to visit.    Allergies  Allergen Reactions  . Symbicort [Budesonide-Formoterol Fumarate] Other (See Comments)    Patient reported dizziness, nausea, headaches and sore throat while using Symbicort 160. Reported on 09/03/17  . Celebrex [Celecoxib]     Bleeding.    . Chocolate Flavor   . Ciprofloxacin Nausea And Vomiting    Headache, shaking.   . Eggs Or Egg-Derived Products   . Flavoring Agent     Unknown  . Gabapentin   . Lyrica [Pregabalin]     "Made me out of it."   . Tramadol   . Wellbutrin [Bupropion]   . Codeine Palpitations    ROS Review of Systems  Constitutional: Negative.   HENT: Negative.   Respiratory: Negative.   Cardiovascular: Negative.   Gastrointestinal: Negative.   Musculoskeletal: Positive for arthralgias, back pain, myalgias and neck pain.  Neurological: Positive for weakness, numbness and headaches.      Objective:    Physical Exam  Constitutional: She is oriented to person, place, and time. She appears well-developed and well-nourished. No distress.  HENT:  Head: Normocephalic and atraumatic.  Right Ear: External ear normal.  Left Ear: External ear normal.   Pulmonary/Chest: Effort normal.  Neurological: She is alert and oriented to person, place, and time.  Skin: She is not diaphoretic.  Psychiatric: She has a normal mood and affect. Her behavior is normal.    BP (!) 183/108 Comment: per pt  Ht 5\' 3"  (1.6 m)   BMI 36.67 kg/m  Wt Readings from Last 3 Encounters:  07/15/18 207  lb (93.9 kg)  07/04/18 207 lb (93.9 kg)  06/28/18 207 lb (93.9 kg)     Health Maintenance Due  Topic Date Due  . HIV Screening  11/21/1970  . PAP SMEAR-Modifier  11/20/1976  . INFLUENZA VACCINE  02/15/2019    There are no preventive care reminders to display for this patient.  Lab Results  Component Value Date   TSH 2.47 03/15/2018   Lab Results  Component Value Date   WBC 7.0 07/04/2018   HGB 15.1 (H) 07/04/2018   HCT 46.9 (H) 07/04/2018   MCV 91.6 07/04/2018   PLT 344 07/04/2018   Lab Results  Component Value Date   NA 139 07/04/2018   K 4.3 07/04/2018   CO2 27 07/04/2018   GLUCOSE 98 07/04/2018   BUN 10 07/04/2018   CREATININE 0.79 07/04/2018   BILITOT 0.8 07/04/2018   ALKPHOS 116 07/04/2018   AST 25 07/04/2018   ALT 32 07/04/2018   PROT 8.2 (H) 07/04/2018   ALBUMIN 4.2 07/04/2018   CALCIUM 10.1 07/04/2018   ANIONGAP 11 07/04/2018   GFR 72.95 03/15/2018   No results found for: CHOL No results found for: HDL No results found for: LDLCALC No results found for: TRIG No results found for: CHOLHDL No results found for: HGBA1C    Assessment & Plan:   Problem List Items Addressed This Visit      Respiratory   COPD with asthma ([place])   Relevant Medications   albuterol (PROVENTIL) (2.5 MG/3ML) 0.083% nebulizer solution     Other   History of systemic lupus erythematosus (SLE) (HCC)   Elevated blood pressure reading without diagnosis of hypertension - Primary      Meds ordered this encounter  Medications  . albuterol (PROVENTIL) (2.5 MG/3ML) 0.083% nebulizer solution    Sig: Take 3 mLs (2.5 mg total) by nebulization every 6  (six) hours as needed for wheezing or shortness of breath.    Dispense:  150 mL    Refill:  0    Follow-up: No follow-ups on file.  Patient was instructed to go to the emergency room for evaluation of possible TIA.  She was then instructed to follow-up with me in the office in 2 weeks.  Virtual Visit via Video Note  I connected with Frances Nelson on 09/03/19 at 10:30 AM EST by a video enabled telemedicine application and verified that I am speaking with the correct person using two identifiers.  Location: Patient: home alone  Provider:    I discussed the limitations of evaluation and management by telemedicine and the availability of in person appointments. The patient expressed understanding and agreed to proceed.  History of Present Illness:    Observations/Objective:   Assessment and Plan:   Follow Up Instructions:    I discussed the assessment and treatment plan with the patient. The patient was provided an opportunity to ask questions and all were answered. The patient agreed with the plan and demonstrated an understanding of the instructions.   The patient was advised to call back or seek an in-person evaluation if the symptoms worsen or if the condition fails to improve as anticipated.  I provided 25 minutes of non-face-to-face time during this encounter.   Libby Maw, MD   Libby Maw, MD

## 2019-10-02 ENCOUNTER — Encounter: Payer: Self-pay | Admitting: Primary Care

## 2019-10-02 ENCOUNTER — Ambulatory Visit (INDEPENDENT_AMBULATORY_CARE_PROVIDER_SITE_OTHER): Payer: 59 | Admitting: Primary Care

## 2019-10-02 ENCOUNTER — Ambulatory Visit (INDEPENDENT_AMBULATORY_CARE_PROVIDER_SITE_OTHER): Payer: 59

## 2019-10-02 ENCOUNTER — Other Ambulatory Visit: Payer: Self-pay

## 2019-10-02 VITALS — BP 152/80 | HR 80 | Temp 97.6°F | Ht 63.5 in | Wt 219.8 lb

## 2019-10-02 DIAGNOSIS — J449 Chronic obstructive pulmonary disease, unspecified: Secondary | ICD-10-CM

## 2019-10-02 DIAGNOSIS — I10 Essential (primary) hypertension: Secondary | ICD-10-CM

## 2019-10-02 DIAGNOSIS — J339 Nasal polyp, unspecified: Secondary | ICD-10-CM | POA: Diagnosis not present

## 2019-10-02 MED ORDER — OMEPRAZOLE 40 MG PO CPDR: 40.0000 mg | DELAYED_RELEASE_CAPSULE | Freq: Every day | ORAL | 3 refills | Status: DC

## 2019-10-02 MED ORDER — DOXYCYCLINE HYCLATE 100 MG PO TABS: 100.0000 mg | ORAL_TABLET | Freq: Two times a day (BID) | ORAL | 0 refills | Status: DC

## 2019-10-02 MED ORDER — ALBUTEROL SULFATE (2.5 MG/3ML) 0.083% IN NEBU: 2.5000 mg | INHALATION_SOLUTION | Freq: Four times a day (QID) | RESPIRATORY_TRACT | 0 refills | Status: DC | PRN

## 2019-10-02 MED ORDER — AMLODIPINE BESYLATE 5 MG PO TABS: 5.0000 mg | ORAL_TABLET | Freq: Every day | ORAL | 1 refills | Status: DC

## 2019-10-02 MED ORDER — PREDNISONE 10 MG PO TABS: ORAL_TABLET | ORAL | 0 refills | Status: DC

## 2019-10-02 MED ORDER — ALBUTEROL SULFATE HFA 108 (90 BASE) MCG/ACT IN AERS: 2.0000 | INHALATION_SPRAY | Freq: Four times a day (QID) | RESPIRATORY_TRACT | 5 refills | Status: DC | PRN

## 2019-10-02 MED FILL — AMLODIPINE BESYLATE 5 MG TA: 5 | 30 days supply | Qty: 30 | Fill #0

## 2019-10-02 MED FILL — predniSONE 10 MG TABS: 10 | 5 days supply | Qty: 10 | Fill #0

## 2019-10-02 MED FILL — ALBUTEROL 0.083 MG/ML SOLN: (2.5 MG/3ML | 8 days supply | Qty: 90 | Fill #0

## 2019-10-02 MED FILL — OMEPRAZOLE 40 MG CPDR: 40 | 30 days supply | Qty: 30 | Fill #0

## 2019-10-02 MED FILL — DOXYCYCLINE HYCLATE 100 MG: 100 | 7 days supply | Qty: 14 | Fill #0

## 2019-10-02 MED FILL — ALBUTEROL SULFATE HFA 108 (: 108 (90 BAS | 25 days supply | Qty: 18 | Fill #0

## 2019-10-02 NOTE — Assessment & Plan Note (Signed)
-   BP readings consistently >130-150/90-100 - Start Norvasc 2.5mg  qd x 3 days; then 5mg  daily - Refer to primary care for new patient visit   - FU in 2 weeks for BP check with BMET

## 2019-10-02 NOTE — Progress Notes (Signed)
@Patient  ID: Frances Nelson, female    DOB: 1956-05-18, 64 y.o.   MRN: [id number]  Chief Complaint  Patient presents with  . Follow-up    sob increased x 1 wk,pnd,cough-yellow since last night,sinus pr.,ear pain bilat.wheezing    Referring provider: Luetta Nutting, DO  HPI: 64 year old female, current smoker (40 pack year hx). PMH significant for COPD with asthma. Patient of Dr. Lamonte Sakai, last seen in October 2019. Not on any maintenance inhaler. Continues albuterol hfa/neb as needed.   10/02/2019 She has not been seen in over a year d/t not having insurance. States that her breathing has been really good until she started smoking again in September 2020. Using nebulizer every night before bed. Over the last last week she has had increased shortness of breath, wheezing and cough. Feels asthma symptoms have been exacerbated d.t sinusitis. She has hx nasal polyps. Reports a lot of post nasal drip with yellow mucus. She is taking over the counter cold/sinus for high blood pressure. She has been checking her BP at home and readings have been elevated between 130-150/90-120. Her family medical doctor moved to [place], she doesn't have a current PCP. States that if she can not find a local PCP she will drive to see Dr. Zigmund Daniel.   Allergies  Allergen Reactions  . Symbicort [Budesonide-Formoterol Fumarate] Other (See Comments)    Patient reported dizziness, nausea, headaches and sore throat while using Symbicort 160. Reported on 09/03/17  . Celebrex [Celecoxib]     Bleeding.    . Chocolate Flavor   . Ciprofloxacin Nausea And Vomiting    Headache, shaking.   . Eggs Or Egg-Derived Products   . Flavoring Agent     Unknown  . Gabapentin   . Lyrica [Pregabalin]     "Made me out of it."   . Tramadol   . Wellbutrin [Bupropion]   . Codeine Palpitations    Immunization History  Administered Date(s) Administered  . Tdap 03/15/2018    Past Medical History:  Diagnosis Date  . Asthma      Tobacco History: Social History   Tobacco Use  Smoking Status Current Every Day Smoker  . Packs/day: 1.00  . Years: 40.00  . Pack years: 40.00  . Types: Cigarettes  . Last attempt to quit: 11/14/2017  . Years since quitting: 1.8  Smokeless Tobacco Never Used  Tobacco Comment   Restarted Sept. 2020   Ready to quit: Not Answered Counseling given: Not Answered Comment: Restarted Sept. 2020   Outpatient Medications Prior to Visit  Medication Sig Dispense Refill  . acidophilus (RISAQUAD) CAPS capsule Take 1 capsule by mouth daily.    . cetirizine (ZYRTEC) 10 MG tablet Take 10 mg by mouth daily.    . Cholecalciferol (HM VITAMIN D3) 100 MCG (4000 UT) CAPS Take 4,000 Units by mouth daily.    . Cranberry-Cholecalciferol 4200-500 MG-UNIT CAPS Take 4,200 Units by mouth daily.    . Diclofenac Sodium (PENNSAID) 2 % SOLN Place 1 application onto the skin 2 (two) times daily. (Patient taking differently: Place 1 application onto the skin 2 (two) times daily. As needed) 1 Bottle 3  . ibuprofen (ADVIL,MOTRIN) 800 MG tablet Take 1 tablet (800 mg total) by mouth every 8 (eight) hours as needed. 60 tablet 1  . methocarbamol (ROBAXIN) 500 MG tablet TAKE 1 TABLET (500 MG TOTAL) BY MOUTH 2 (TWO) TIMES DAILY. TAKE DURING DAYTIME 60 tablet 2  . Multiple Vitamins-Minerals (MULTIVITAMIN WITH MINERALS) tablet Take 1 tablet by mouth  daily.    . albuterol (PROVENTIL) (2.5 MG/3ML) 0.083% nebulizer solution Take 3 mLs (2.5 mg total) by nebulization every 6 (six) hours as needed for wheezing or shortness of breath. 150 mL 0  . omeprazole (PRILOSEC) 40 MG capsule Take 40 mg by mouth daily.    . Black Pepper-Turmeric (TURMERIC CURCUMIN) 11-998 MG CAPS     . Naproxen-Esomeprazole 500-20 MG TBEC Take 1 tablet by mouth 2 (two) times daily. 60 tablet 3  . ranitidine (ZANTAC) 150 MG tablet Take 150 mg by mouth daily.    . tizanidine (ZANAFLEX) 6 MG capsule Take 1 capsule (6 mg total) by mouth at bedtime. (Patient not  taking: Reported on 10/02/2019) 30 capsule 2  . Vitamin D, Ergocalciferol, (DRISDOL) 1.25 MG (50000 UT) CAPS capsule Take 1 capsule (50,000 Units total) by mouth every 7 (seven) days. Take for 8 total doses(weeks) 8 capsule 0  . predniSONE (DELTASONE) 5 MG tablet Take 6 pills for first day, 5 pills second day, 4 pills third day, 3 pills fourth day, 2 pills the fifth day, and 1 pill sixth day. 21 tablet 0   No facility-administered medications prior to visit.   Review of Systems  Review of Systems  Constitutional: Negative.   HENT: Positive for congestion and postnasal drip.   Respiratory: Positive for cough, shortness of breath and wheezing.   Cardiovascular: Negative.   Musculoskeletal: Positive for back pain.  Neurological: Negative.    Physical Exam  BP (!) 152/80 (BP Location: Left Arm)   Pulse 80   Temp 97.6 F (36.4 C) (Temporal)   Ht 5' 3.5" (1.613 m)   Wt 219 lb 12.8 oz (99.7 kg)   SpO2 97%   BMI 38.33 kg/m  Physical Exam Constitutional:      Appearance: Normal appearance.  HENT:     Head: Normocephalic and atraumatic.     Nose:     Comments: Visible bilateral nasal polyps     Mouth/Throat:     Mouth: Mucous membranes are moist.     Pharynx: Oropharynx is clear. Posterior oropharyngeal erythema present.  Cardiovascular:     Rate and Rhythm: Normal rate and regular rhythm.  Pulmonary:     Effort: Pulmonary effort is normal.     Breath sounds: Wheezing present.     Comments: Dull exp wheezing  Skin:    General: Skin is warm and dry.  Neurological:     General: No focal deficit present.     Mental Status: She is alert and oriented to person, place, and time. Mental status is at baseline.  Psychiatric:        Mood and Affect: Mood normal.        Behavior: Behavior normal.        Thought Content: Thought content normal.        Judgment: Judgment normal.      Lab Results:  CBC    Component Value Date/Time   WBC 7.0 07/04/2018 1119   RBC 5.12 (H)  07/04/2018 1119   HGB 15.1 (H) 07/04/2018 1119   HCT 46.9 (H) 07/04/2018 1119   PLT 344 07/04/2018 1119   MCV 91.6 07/04/2018 1119   MCH 29.5 07/04/2018 1119   MCHC 32.2 07/04/2018 1119   RDW 12.3 07/04/2018 1119   LYMPHSABS 1.4 07/04/2018 1119   MONOABS 0.4 07/04/2018 1119   EOSABS 0.1 07/04/2018 1119   BASOSABS 0.0 07/04/2018 1119    BMET    Component Value Date/Time   NA 139 07/04/2018  1119   K 4.3 07/04/2018 1119   CL 101 07/04/2018 1119   CO2 27 07/04/2018 1119   GLUCOSE 98 07/04/2018 1119   BUN 10 07/04/2018 1119   CREATININE 0.79 07/04/2018 1119   CALCIUM 10.1 07/04/2018 1119   GFRNONAA >60 07/04/2018 1119   GFRAA >60 07/04/2018 1119    BNP No results found for: BNP  ProBNP No results found for: PROBNP  Imaging: No results found.   Assessment & Plan:   COPD with asthma ([place]) - Symptoms exacerbated d/t acute sinusitis - RX doxycycline 1 tab twice daily x 1 week and prednisone 20mg  x 5 days - Advised regular mucinex twice daily  - Refill Albuterol nebulizer/Ventolin hfa-  q6 hours prn breakthrough sob/wheezing - Orders: CXR   Hypertension - BP readings consistently >130-150/90-100 - Start Norvasc 2.5mg  qd x 3 days; then 5mg  daily - Refer to primary care for new patient visit   - FU in 2 weeks for BP check with BMET   Nasal polyps - Associated sinusitis and PND  - Continue Zyrtec 10mg  daily; OTC nasacort nasal spray once daily  - Refer to ENT   Martyn Ehrich, NP 10/02/2019

## 2019-10-02 NOTE — Assessment & Plan Note (Addendum)
-   Symptoms exacerbated d/t acute sinusitis - RX doxycycline 1 tab twice daily x 1 week and prednisone 20mg  x 5 days - Advised regular mucinex twice daily  - Refill Albuterol nebulizer/Ventolin hfa-  q6 hours prn breakthrough sob/wheezing - Orders: CXR

## 2019-10-02 NOTE — Assessment & Plan Note (Addendum)
-   Associated sinusitis and PND  - Continue Zyrtec 10mg  daily; OTC nasacort nasal spray once daily  - Refer to ENT

## 2019-10-02 NOTE — Patient Instructions (Addendum)
Rx: - Prednisone 20mg  x 5 days - Doxycycline 1 tab twice daily x 1 week - Norvasc 2.5mg  x 3 days; then 5mg  daily (check BP daily, do not take if < 100/60)  Refilling: - Albtuerol neb  - Ventolin hfa - Omeprazole 40mg  daily   Over the counter: - Nasacort 1 spray per nostril once daily - Regular Mucinex 600mg  tablet twice daily x 10 days (take with full glass of water)  Referral:  - ENT re: nasal polyps - Primary care re: new patient/hypertension   Orders: - Labs- BMET 2 days prior or day of visit   Follow-up: - 2 weeks with Beth NP for BP check    Hypertension, Adult Hypertension is another name for high blood pressure. High blood pressure forces your heart to work harder to pump blood. This can cause problems over time. There are two numbers in a blood pressure reading. There is a top number (systolic) over a bottom number (diastolic). It is best to have a blood pressure that is below 120/80. Healthy choices can help lower your blood pressure, or you may need medicine to help lower it. What are the causes? The cause of this condition is not known. Some conditions may be related to high blood pressure. What increases the risk?  Smoking.  Having type 2 diabetes mellitus, high cholesterol, or both.  Not getting enough exercise or physical activity.  Being overweight.  Having too much fat, sugar, calories, or salt (sodium) in your diet.  Drinking too much alcohol.  Having long-term (chronic) kidney disease.  Having a family history of high blood pressure.  Age. Risk increases with age.  Race. You may be at higher risk if you are African American.  Gender. Men are at higher risk than women before age 77. After age 63, women are at higher risk than men.  Having obstructive sleep apnea.  Stress. What are the signs or symptoms?  High blood pressure may not cause symptoms. Very high blood pressure (hypertensive crisis) may cause: ? Headache. ? Feelings of  worry or nervousness (anxiety). ? Shortness of breath. ? Nosebleed. ? A feeling of being sick to your stomach (nausea). ? Throwing up (vomiting). ? Changes in how you see. ? Very bad chest pain. ? Seizures. How is this treated?  This condition is treated by making healthy lifestyle changes, such as: ? Eating healthy foods. ? Exercising more. ? Drinking less alcohol.  Your health care provider may prescribe medicine if lifestyle changes are not enough to get your blood pressure under control, and if: ? Your top number is above 130. ? Your bottom number is above 80.  Your personal target blood pressure may vary. Follow these instructions at home: Eating and drinking   If told, follow the DASH eating plan. To follow this plan: ? Fill one half of your plate at each meal with fruits and vegetables. ? Fill one fourth of your plate at each meal with whole grains. Whole grains include whole-wheat pasta, brown rice, and whole-grain bread. ? Eat or drink low-fat dairy products, such as skim milk or low-fat yogurt. ? Fill one fourth of your plate at each meal with low-fat (lean) proteins. Low-fat proteins include fish, chicken without skin, eggs, beans, and tofu. ? Avoid fatty meat, cured and processed meat, or chicken with skin. ? Avoid pre-made or processed food.  Eat less than 1,500 mg of salt each day.  Do not drink alcohol if: ? Your doctor tells you not to  drink. ? You are pregnant, may be pregnant, or are planning to become pregnant.  If you drink alcohol: ? Limit how much you use to:  0-1 drink a day for women.  0-2 drinks a day for men. ? Be aware of how much alcohol is in your drink. In the [place] one drink equals one 12 oz bottle of beer (355 mL), one 5 oz glass of wine (148 mL), or one 1 oz glass of hard liquor (44 mL). Lifestyle   Work with your doctor to stay at a healthy weight or to lose weight. Ask your doctor what the best weight is for you.  Get at least 30  minutes of exercise most days of the week. This may include walking, swimming, or biking.  Get at least 30 minutes of exercise that strengthens your muscles (resistance exercise) at least 3 days a week. This may include lifting weights or doing Pilates.  Do not use any products that contain nicotine or tobacco, such as cigarettes, e-cigarettes, and chewing tobacco. If you need help quitting, ask your doctor.  Check your blood pressure at home as told by your doctor.  Keep all follow-up visits as told by your doctor. This is important. Medicines  Take over-the-counter and prescription medicines only as told by your doctor. Follow directions carefully.  Do not skip doses of blood pressure medicine. The medicine does not work as well if you skip doses. Skipping doses also puts you at risk for problems.  Ask your doctor about side effects or reactions to medicines that you should watch for. Contact a doctor if you:  Think you are having a reaction to the medicine you are taking.  Have headaches that keep coming back (recurring).  Feel dizzy.  Have swelling in your ankles.  Have trouble with your vision. Get help right away if you:  Get a very bad headache.  Start to feel mixed up (confused).  Feel weak or numb.  Feel faint.  Have very bad pain in your: ? Chest. ? Belly (abdomen).  Throw up more than once.  Have trouble breathing. Summary  Hypertension is another name for high blood pressure.  High blood pressure forces your heart to work harder to pump blood.  For most people, a normal blood pressure is less than 120/80.  Making healthy choices can help lower blood pressure. If your blood pressure does not get lower with healthy choices, you may need to take medicine. This information is not intended to replace advice given to you by your health care provider. Make sure you discuss any questions you have with your health care provider. Document Revised: 03/13/2018  Document Reviewed: 03/13/2018 Elsevier Patient Education  [place].   COVID-19 Vaccine Information can be found at: ShippingScam.co.uk For questions related to vaccine distribution or appointments, please email [email or web address] or call [phone].

## 2019-10-13 ENCOUNTER — Other Ambulatory Visit (INDEPENDENT_AMBULATORY_CARE_PROVIDER_SITE_OTHER): Payer: 59

## 2019-10-13 DIAGNOSIS — I10 Essential (primary) hypertension: Secondary | ICD-10-CM

## 2019-10-13 DIAGNOSIS — J449 Chronic obstructive pulmonary disease, unspecified: Secondary | ICD-10-CM | POA: Diagnosis not present

## 2019-10-14 LAB — BASIC METABOLIC PANEL
BUN: 11 mg/dL (ref 6–23)
CO2: 29 mEq/L (ref 19–32)
Calcium: 9.4 mg/dL (ref 8.4–10.5)
Chloride: 101 mEq/L (ref 96–112)
Creatinine, Ser: 0.74 mg/dL (ref 0.40–1.20)
GFR: 79.04 mL/min (ref >60.00)
Glucose, Bld: 84 mg/dL (ref 70–99)
Potassium: 3.9 mEq/L (ref 3.5–5.1)
Sodium: 136 mEq/L (ref 135–145)

## 2019-10-16 ENCOUNTER — Encounter: Payer: Self-pay | Admitting: Primary Care

## 2019-10-16 ENCOUNTER — Ambulatory Visit (INDEPENDENT_AMBULATORY_CARE_PROVIDER_SITE_OTHER): Payer: 59 | Admitting: Primary Care

## 2019-10-16 ENCOUNTER — Other Ambulatory Visit: Payer: Self-pay

## 2019-10-16 VITALS — BP 124/72 | Temp 97.0°F | Ht 63.75 in | Wt 222.0 lb

## 2019-10-16 DIAGNOSIS — I10 Essential (primary) hypertension: Secondary | ICD-10-CM

## 2019-10-16 DIAGNOSIS — R600 Localized edema: Secondary | ICD-10-CM

## 2019-10-16 DIAGNOSIS — J441 Chronic obstructive pulmonary disease with (acute) exacerbation: Secondary | ICD-10-CM | POA: Diagnosis not present

## 2019-10-16 DIAGNOSIS — R0781 Pleurodynia: Secondary | ICD-10-CM

## 2019-10-16 DIAGNOSIS — R0602 Shortness of breath: Secondary | ICD-10-CM

## 2019-10-16 LAB — BRAIN NATRIURETIC PEPTIDE: Pro B Natriuretic peptide (BNP): 20 pg/mL (ref 0.0–100.0)

## 2019-10-16 LAB — D-DIMER, QUANTITATIVE: D-Dimer, Quant: 0.34 mcg/mL FEU (ref <0.50)

## 2019-10-16 MED ORDER — LISINOPRIL 10 MG PO TABS: 10.0000 mg | ORAL_TABLET | Freq: Every day | ORAL | 0 refills | Status: DC

## 2019-10-16 MED FILL — LISINOPRIL 10 MG TABS: 10 | 30 days supply | Qty: 30 | Fill #0

## 2019-10-16 NOTE — Assessment & Plan Note (Addendum)
-   Chronic LE edema  - BNP and D-dimer wnl

## 2019-10-16 NOTE — Progress Notes (Signed)
Patient identification verified. Recent lab work reviewed. Per Derl Barrow NP, BNP was normal, still waiting on D-dimer, will send in lisinopril, no need for diuretic right now. Patient verbalized understanding of results and plan of care.

## 2019-10-16 NOTE — Assessment & Plan Note (Signed)
-   BP 140/86 today - Experiencing increased peripheral edema - Discontinue Norvasc; Start Lisinopril 10mg  daily  - Patient needs to establish with new primary care provider - FU in 2-4 weeks for BP check

## 2019-10-16 NOTE — Progress Notes (Signed)
Please let patient know her BNP was normal. Still waiting on ddimer. Will send in lisinopril. No need for diuretic right now.

## 2019-10-16 NOTE — Progress Notes (Signed)
D-dimer was normal

## 2019-10-16 NOTE — Assessment & Plan Note (Addendum)
-   EKG completed today showing normal sinus rhythm; no ST changes  - Follow up with PCP, if continues recommend exercise stress test

## 2019-10-16 NOTE — Patient Instructions (Addendum)
If D-dimer is elevated we will check lower extremity doppler d/t leg swelling.  If BNP is elevated will get echocardiogram and start BP medication with diuretic  Recommendations: Stop Norvasc Start lisinpril 10  Referral: Please refer patient to local [facility] primary care   Orders: Labs (D-dimer/BNP)  Follow-up: 2-4 weeks BP check    Hypertension, Adult Hypertension is another name for high blood pressure. High blood pressure forces your heart to work harder to pump blood. This can cause problems over time. There are two numbers in a blood pressure reading. There is a top number (systolic) over a bottom number (diastolic). It is best to have a blood pressure that is below 120/80. Healthy choices can help lower your blood pressure, or you may need medicine to help lower it. What are the causes? The cause of this condition is not known. Some conditions may be related to high blood pressure. What increases the risk?  Smoking.  Having type 2 diabetes mellitus, high cholesterol, or both.  Not getting enough exercise or physical activity.  Being overweight.  Having too much fat, sugar, calories, or salt (sodium) in your diet.  Drinking too much alcohol.  Having long-term (chronic) kidney disease.  Having a family history of high blood pressure.  Age. Risk increases with age.  Race. You may be at higher risk if you are African American.  Gender. Men are at higher risk than women before age 19. After age 38, women are at higher risk than men.  Having obstructive sleep apnea.  Stress. What are the signs or symptoms?  High blood pressure may not cause symptoms. Very high blood pressure (hypertensive crisis) may cause: ? Headache. ? Feelings of worry or nervousness (anxiety). ? Shortness of breath. ? Nosebleed. ? A feeling of being sick to your stomach (nausea). ? Throwing up (vomiting). ? Changes in how you see. ? Very bad chest pain. ? Seizures. How is this  treated?  This condition is treated by making healthy lifestyle changes, such as: ? Eating healthy foods. ? Exercising more. ? Drinking less alcohol.  Your health care provider may prescribe medicine if lifestyle changes are not enough to get your blood pressure under control, and if: ? Your top number is above 130. ? Your bottom number is above 80.  Your personal target blood pressure may vary. Follow these instructions at home: Eating and drinking   If told, follow the DASH eating plan. To follow this plan: ? Fill one half of your plate at each meal with fruits and vegetables. ? Fill one fourth of your plate at each meal with whole grains. Whole grains include whole-wheat pasta, brown rice, and whole-grain bread. ? Eat or drink low-fat dairy products, such as skim milk or low-fat yogurt. ? Fill one fourth of your plate at each meal with low-fat (lean) proteins. Low-fat proteins include fish, chicken without skin, eggs, beans, and tofu. ? Avoid fatty meat, cured and processed meat, or chicken with skin. ? Avoid pre-made or processed food.  Eat less than 1,500 mg of salt each day.  Do not drink alcohol if: ? Your doctor tells you not to drink. ? You are pregnant, may be pregnant, or are planning to become pregnant.  If you drink alcohol: ? Limit how much you use to:  0-1 drink a day for women.  0-2 drinks a day for men. ? Be aware of how much alcohol is in your drink. In the [place] one drink equals one 12 oz  bottle of beer (355 mL), one 5 oz glass of wine (148 mL), or one 1 oz glass of hard liquor (44 mL). Lifestyle   Work with your doctor to stay at a healthy weight or to lose weight. Ask your doctor what the best weight is for you.  Get at least 30 minutes of exercise most days of the week. This may include walking, swimming, or biking.  Get at least 30 minutes of exercise that strengthens your muscles (resistance exercise) at least 3 days a week. This may include  lifting weights or doing Pilates.  Do not use any products that contain nicotine or tobacco, such as cigarettes, e-cigarettes, and chewing tobacco. If you need help quitting, ask your doctor.  Check your blood pressure at home as told by your doctor.  Keep all follow-up visits as told by your doctor. This is important. Medicines  Take over-the-counter and prescription medicines only as told by your doctor. Follow directions carefully.  Do not skip doses of blood pressure medicine. The medicine does not work as well if you skip doses. Skipping doses also puts you at risk for problems.  Ask your doctor about side effects or reactions to medicines that you should watch for. Contact a doctor if you:  Think you are having a reaction to the medicine you are taking.  Have headaches that keep coming back (recurring).  Feel dizzy.  Have swelling in your ankles.  Have trouble with your vision. Get help right away if you:  Get a very bad headache.  Start to feel mixed up (confused).  Feel weak or numb.  Feel faint.  Have very bad pain in your: ? Chest. ? Belly (abdomen).  Throw up more than once.  Have trouble breathing. Summary  Hypertension is another name for high blood pressure.  High blood pressure forces your heart to work harder to pump blood.  For most people, a normal blood pressure is less than 120/80.  Making healthy choices can help lower blood pressure. If your blood pressure does not get lower with healthy choices, you may need to take medicine. This information is not intended to replace advice given to you by your health care provider. Make sure you discuss any questions you have with your health care provider. Document Revised: 03/13/2018 Document Reviewed: 03/13/2018 Elsevier Patient Education  2020 [employer].

## 2019-10-16 NOTE — Progress Notes (Signed)
@Patient  ID: Frances Nelson, female    DOB: [birth year], 64 y.o.   MRN: [id number]  Chief Complaint  Patient presents with  . Follow-up    Referring provider: Luetta Nutting, DO  HPI: 64 year old female, current smoker (40 pack year hx). PMH significant for COPD with asthma. Patient of Dr. Lamonte Sakai, last seen in October 2019. Not on any maintenance inhaler. Continues albuterol hfa/neb as needed.   Previous [facility] pulmonary encounter: 10/02/2019 She has not been seen in over a year d/t not having insurance. States that her breathing has been really good until she started smoking again in September 2020. Using nebulizer every night before bed. Over the last last week she has had increased shortness of breath, wheezing and cough. Feels asthma symptoms have been exacerbated d.t sinusitis. She has hx nasal polyps. Reports a lot of post nasal drip with yellow mucus. She is taking over the counter cold/sinus for high blood pressure. She has been checking her BP at home and readings have been elevated between 130-150/90-120. Her family medical doctor moved to [place], she doesn't have a current PCP. States that if she can not find a local PCP she will drive to see Dr. Zigmund Daniel.   10/16/2019 Patient presents today for follow-up BP check. She has been taking Norvasc 5mg  daily since last visit. Blood pressure readings have been between 130-150/80s at home. Reports muscles aches/spasm in legs, worsening LE swelling and peripheral edema. She experienced left sided pleuretic pain yesterday. Denies active chest discomfort today. She is still smoking. She has not established with PCP.   Allergies  Allergen Reactions  . Symbicort [Budesonide-Formoterol Fumarate] Other (See Comments)    Patient reported dizziness, nausea, headaches and sore throat while using Symbicort 160. Reported on 09/03/17  . Celebrex [Celecoxib]     Bleeding.    . Chocolate Flavor   . Ciprofloxacin Nausea And Vomiting    Headache, shaking.    . Eggs Or Egg-Derived Products   . Flavoring Agent     Unknown  . Gabapentin   . Lyrica [Pregabalin]     "Made me out of it."   . Tramadol   . Wellbutrin [Bupropion]   . Codeine Palpitations    Immunization History  Administered Date(s) Administered  . Tdap 03/15/2018    Past Medical History:  Diagnosis Date  . Asthma     Tobacco History: Social History   Tobacco Use  Smoking Status Current Every Day Smoker  . Packs/day: 1.00  . Years: 40.00  . Pack years: 40.00  . Types: Cigarettes  . Last attempt to quit: 11/14/2017  . Years since quitting: 1.9  Smokeless Tobacco Never Used  Tobacco Comment   Restarted Sept. 2020   Ready to quit: Not Answered Counseling given: Not Answered Comment: Restarted Sept. 2020   Outpatient Medications Prior to Visit  Medication Sig Dispense Refill  . acidophilus (RISAQUAD) CAPS capsule Take 1 capsule by mouth daily.    Marland Kitchen albuterol (PROVENTIL) (2.5 MG/3ML) 0.083% nebulizer solution Take 3 mLs (2.5 mg total) by nebulization every 6 (six) hours as needed for wheezing or shortness of breath. 150 mL 0  . albuterol (VENTOLIN HFA) 108 (90 Base) MCG/ACT inhaler Inhale 2 puffs into the lungs every 6 (six) hours as needed for wheezing or shortness of breath. 8 g 5  . cetirizine (ZYRTEC) 10 MG tablet Take 10 mg by mouth daily.    . Cholecalciferol (HM VITAMIN D3) 100 MCG (4000 UT) CAPS Take 4,000 Units by mouth  daily.    . Cranberry-Cholecalciferol 4200-500 MG-UNIT CAPS Take 4,200 Units by mouth daily.    . Diclofenac Sodium (PENNSAID) 2 % SOLN Place 1 application onto the skin 2 (two) times daily. (Patient taking differently: Place 1 application onto the skin 2 (two) times daily. As needed) 1 Bottle 3  . ibuprofen (ADVIL,MOTRIN) 800 MG tablet Take 1 tablet (800 mg total) by mouth every 8 (eight) hours as needed. 60 tablet 1  . methocarbamol (ROBAXIN) 500 MG tablet TAKE 1 TABLET (500 MG TOTAL) BY MOUTH 2 (TWO) TIMES DAILY. TAKE DURING DAYTIME 60  tablet 2  . Multiple Vitamins-Minerals (MULTIVITAMIN WITH MINERALS) tablet Take 1 tablet by mouth daily.    Marland Kitchen omeprazole (PRILOSEC) 40 MG capsule Take 1 capsule (40 mg total) by mouth daily. 30 capsule 3  . amLODipine (NORVASC) 5 MG tablet Take 1 tablet (5 mg total) by mouth daily. Take 2.5mg  x 3 days; then increase to 5mg  daily 30 tablet 1  . doxycycline (VIBRA-TABS) 100 MG tablet Take 1 tablet (100 mg total) by mouth 2 (two) times daily. 14 tablet 0  . predniSONE (DELTASONE) 10 MG tablet Take 2 tabs x 5 days 10 tablet 0  . tizanidine (ZANAFLEX) 6 MG capsule Take 1 capsule (6 mg total) by mouth at bedtime. (Patient not taking: Reported on 10/16/2019) 30 capsule 2  . Black Pepper-Turmeric (TURMERIC CURCUMIN) 11-998 MG CAPS     . Naproxen-Esomeprazole 500-20 MG TBEC Take 1 tablet by mouth 2 (two) times daily. 60 tablet 3  . ranitidine (ZANTAC) 150 MG tablet Take 150 mg by mouth daily.    . Vitamin D, Ergocalciferol, (DRISDOL) 1.25 MG (50000 UT) CAPS capsule Take 1 capsule (50,000 Units total) by mouth every 7 (seven) days. Take for 8 total doses(weeks) 8 capsule 0   No facility-administered medications prior to visit.    Review of Systems  Review of Systems  Constitutional: Positive for fatigue.  Respiratory: Negative for shortness of breath.   Cardiovascular: Positive for leg swelling. Negative for chest pain.  Musculoskeletal:       Muscle aches   Physical Exam  BP 124/72   Temp (!) 97 F (36.1 C) (Temporal)   Ht 5' 3.75" (1.619 m)   Wt 222 lb (100.7 kg)   BMI 38.41 kg/m  Physical Exam Constitutional:      Appearance: Normal appearance.  HENT:     Head: Normocephalic and atraumatic.  Cardiovascular:     Rate and Rhythm: Normal rate.  Pulmonary:     Effort: Pulmonary effort is normal. No respiratory distress.     Breath sounds: No wheezing.  Musculoskeletal:        General: Normal range of motion.  Neurological:     General: No focal deficit present.     Mental Status:  She is alert and oriented to person, place, and time. Mental status is at baseline.  Psychiatric:        Mood and Affect: Mood normal.        Behavior: Behavior normal.        Thought Content: Thought content normal.        Judgment: Judgment normal.      Lab Results:  CBC    Component Value Date/Time   WBC 7.0 07/04/2018 1119   RBC 5.12 (H) 07/04/2018 1119   HGB 15.1 (H) 07/04/2018 1119   HCT 46.9 (H) 07/04/2018 1119   PLT 344 07/04/2018 1119   MCV 91.6 07/04/2018 1119   MCH 29.5  07/04/2018 1119   MCHC 32.2 07/04/2018 1119   RDW 12.3 07/04/2018 1119   LYMPHSABS 1.4 07/04/2018 1119   MONOABS 0.4 07/04/2018 1119   EOSABS 0.1 07/04/2018 1119   BASOSABS 0.0 07/04/2018 1119    BMET    Component Value Date/Time   NA 136 10/13/2019 1612   K 3.9 10/13/2019 1612   CL 101 10/13/2019 1612   CO2 29 10/13/2019 1612   GLUCOSE 84 10/13/2019 1612   BUN 11 10/13/2019 1612   CREATININE 0.74 10/13/2019 1612   CREATININE 0.79 07/04/2018 1119   CALCIUM 9.4 10/13/2019 1612   GFRNONAA >60 07/04/2018 1119   GFRAA >60 07/04/2018 1119    BNP No results found for: BNP  ProBNP    Component Value Date/Time   PROBNP 20.0 10/16/2019 1116    Imaging: DG Chest 2 View  Result Date: 10/02/2019 CLINICAL DATA:  Cough, asthma, COPD EXAM: CHEST - 2 VIEW COMPARISON:  06/06/2017 FINDINGS: Upper normal heart size. Mediastinal contours and pulmonary vascularity normal. Atherosclerotic calcification aorta. Lungs clear. No infiltrate, pleural effusion, or pneumothorax. Mild scattered endplate spur formation thoracic spine. IMPRESSION: No acute abnormalities. Electronically Signed   By: Lavonia Dana M.D.   On: 10/02/2019 11:49     Assessment & Plan:   Hypertension - BP 140/86 today - Experiencing increased peripheral edema - Discontinue Norvasc; Start Lisinopril 10mg  daily  - Patient needs to establish with new primary care provider - FU in 2-4 weeks for BP check   Bilateral leg edema -  Chronic LE edema  - BNP and D-dimer wnl  Pleuritic chest pain - EKG completed today showing normal sinus rhythm; no ST changes  - Follow up with PCP, if continues recommend exercise stress test   Martyn Ehrich, NP 10/16/2019

## 2019-11-06 ENCOUNTER — Other Ambulatory Visit: Payer: Self-pay

## 2019-11-06 ENCOUNTER — Ambulatory Visit (INDEPENDENT_AMBULATORY_CARE_PROVIDER_SITE_OTHER): Payer: 59 | Admitting: Primary Care

## 2019-11-06 ENCOUNTER — Ambulatory Visit: Payer: 59 | Attending: Internal Medicine

## 2019-11-06 ENCOUNTER — Encounter: Payer: Self-pay | Admitting: Primary Care

## 2019-11-06 VITALS — BP 142/80 | HR 83 | Temp 97.1°F | Ht 63.75 in | Wt 219.8 lb

## 2019-11-06 DIAGNOSIS — J449 Chronic obstructive pulmonary disease, unspecified: Secondary | ICD-10-CM

## 2019-11-06 DIAGNOSIS — J301 Allergic rhinitis due to pollen: Secondary | ICD-10-CM | POA: Diagnosis not present

## 2019-11-06 DIAGNOSIS — Z23 Encounter for immunization: Secondary | ICD-10-CM

## 2019-11-06 DIAGNOSIS — K219 Gastro-esophageal reflux disease without esophagitis: Secondary | ICD-10-CM | POA: Diagnosis not present

## 2019-11-06 DIAGNOSIS — I10 Essential (primary) hypertension: Secondary | ICD-10-CM | POA: Diagnosis not present

## 2019-11-06 LAB — BASIC METABOLIC PANEL
BUN: 14 mg/dL (ref 6–23)
CO2: 30 mEq/L (ref 19–32)
Calcium: 9.4 mg/dL (ref 8.4–10.5)
Chloride: 103 mEq/L (ref 96–112)
Creatinine, Ser: 0.69 mg/dL (ref 0.40–1.20)
GFR: 85.67 mL/min (ref >60.00)
Glucose, Bld: 95 mg/dL (ref 70–99)
Potassium: 4.4 mEq/L (ref 3.5–5.1)
Sodium: 139 mEq/L (ref 135–145)

## 2019-11-06 MED ORDER — LISINOPRIL-HYDROCHLOROTHIAZIDE 20-12.5 MG PO TABS: 1.0000 | ORAL_TABLET | Freq: Every day | ORAL | 3 refills | Status: DC

## 2019-11-06 MED ORDER — FAMOTIDINE 20 MG PO TABS: 20.0000 mg | ORAL_TABLET | Freq: Two times a day (BID) | ORAL | 0 refills | Status: DC

## 2019-11-06 MED FILL — LISINOPRIL-HCTZ 20-12.5 MG: 20-12.5 | 60 days supply | Qty: 60 | Fill #0

## 2019-11-06 NOTE — Assessment & Plan Note (Signed)
-   Continue Albuterol inhaler and nebulizer as needed for wheezing - Patient deferred any maintenance inhalers at this time as she has tried many such as Firefighter and Symbicort without any benefit. She states that her breathing is controlled and stable on her Albuterol medication. Patient will notify office if symptoms worsens. - Follow up in office in 6 months with Dr. Lamonte Sakai or APP

## 2019-11-06 NOTE — Progress Notes (Signed)
@Patient  ID: Frances Nelson, female    DOB: [birth year], 64 y.o.   MRN: [id number]  Chief Complaint  Patient presents with  . Follow-up    Recheck BP,sob-sl. worse with allergies,cough-clear,pnd    Referring provider: Libby Maw  HPI: 64 year old female, current smoker (40 pack year hx). PMH significant for COPD with asthma. Patient of Dr. Lamonte Sakai, last seen in October 2019. Not on any maintenance inhaler. Continues albuterol hfa/neb as needed.   Previous [facility] pulmonary encounter: 10/02/2019 She has not been seen in over a year d/t not having insurance. States that her breathing has been really good until she started smoking again in September 2020. Using nebulizer every night before bed. Over the last last week she has had increased shortness of breath, wheezing and cough. Feels asthma symptoms have been exacerbated d.t sinusitis. She has hx nasal polyps. Reports a lot of post nasal drip with yellow mucus. She is taking over the counter cold/sinus for high blood pressure. She has been checking her BP at home and readings have been elevated between 130-150/90-120. Her family medical doctor moved to [place], she doesn't have a current PCP. States that if she can not find a local PCP she will drive to see Dr. Zigmund Daniel.   10/16/2019 Patient presents today for follow-up BP check. She has been taking Norvasc 5mg  daily since last visit. Blood pressure readings have been between 130-150/80s at home. Reports muscles aches/spasm in legs, worsening LE swelling and peripheral edema. She experienced left sided pleuretic pain yesterday. Denies active chest discomfort today. She is still smoking. She has not established with PCP.  11/06/2019   Patient here for follow up visit for BP check. Patient was started on Lisinopril 10 mg and DC'd Norvasc at last visit due to LE swelling. Patient expressed concern at last visit about starting a diuretic and not wanting to use that if possible.  BP today  in office was 142/80. Her BP readings at home have been averaging 144/88; when initially starting lisinopril her BP systolic got as low as [id number]. Patient's swelling in LE is much improved since stopping Norvasc. Patient is willing to do the combination of Lisinopril and HCTZ at today's visit to help improve her hypertension. Patient denies any swelling, HA, chest pain, palpitations. Patient has appointment in May 10 with a PCP at [facility] in [place], [place].   Patient does have some complaints of worsening allergies such as PND, sneezing, cough. She does have some mild SOB related to her allergies as well. She is currently taking Nasanex 1 puff each nare and Zyrtec for allergies. She also is taking Albuterol nebulizer every night before bedtime and Albuterol inhaler once inhaler once a day. She states that this is her baseline and she has been taking her Albuterol like this for years. Patient deferred starting any maintenance inhaler at this time as she has tried Firefighter, Symbicort, and some others that provided no benefit. She states that she will notify office if symptoms worsens.   Her acid reflux is not well controlled on current omeprazole medication. She feels like it isn't helping. Has taken famotidine with it and feels like it didn't help but is willing to give it a try to see if there is some improvement.  Patient is receiving the covid vaccine today. Patient had concerns about her egg allergy. Patient informed after consulting physician in office that it is okay for her to get.   Allergies  Allergen Reactions  . Symbicort [  Budesonide-Formoterol Fumarate] Other (See Comments)    Patient reported dizziness, nausea, headaches and sore throat while using Symbicort 160. Reported on 09/03/17  . Celebrex [Celecoxib]     Bleeding.    . Chocolate Flavor   . Ciprofloxacin Nausea And Vomiting    Headache, shaking.   . Eggs Or Egg-Derived Products   . Flavoring Agent     Unknown  .  Gabapentin   . Lyrica [Pregabalin]     "Made me out of it."   . Tramadol   . Wellbutrin [Bupropion]   . Codeine Palpitations    Immunization History  Administered Date(s) Administered  . Tdap 03/15/2018    Past Medical History:  Diagnosis Date  . Asthma     Tobacco History: Social History   Tobacco Use  Smoking Status Current Every Day Smoker  . Packs/day: 1.00  . Years: 40.00  . Pack years: 40.00  . Types: Cigarettes  . Last attempt to quit: 11/14/2017  . Years since quitting: 1.9  Smokeless Tobacco Never Used  Tobacco Comment   Restarted Sept. 2020   Ready to quit: Not Answered Counseling given: Not Answered Comment: Restarted Sept. 2020   Outpatient Medications Prior to Visit  Medication Sig Dispense Refill  . acidophilus (RISAQUAD) CAPS capsule Take 1 capsule by mouth daily.    Marland Kitchen albuterol (PROVENTIL) (2.5 MG/3ML) 0.083% nebulizer solution Take 3 mLs (2.5 mg total) by nebulization every 6 (six) hours as needed for wheezing or shortness of breath. 150 mL 0  . albuterol (VENTOLIN HFA) 108 (90 Base) MCG/ACT inhaler Inhale 2 puffs into the lungs every 6 (six) hours as needed for wheezing or shortness of breath. 8 g 5  . cetirizine (ZYRTEC) 10 MG tablet Take 10 mg by mouth daily.    . Cholecalciferol (HM VITAMIN D3) 100 MCG (4000 UT) CAPS Take 4,000 Units by mouth daily.    . Cranberry-Cholecalciferol 4200-500 MG-UNIT CAPS Take 4,200 Units by mouth daily.    . Diclofenac Sodium (PENNSAID) 2 % SOLN Place 1 application onto the skin 2 (two) times daily. (Patient taking differently: Place 1 application onto the skin 2 (two) times daily. As needed) 1 Bottle 3  . ibuprofen (ADVIL,MOTRIN) 800 MG tablet Take 1 tablet (800 mg total) by mouth every 8 (eight) hours as needed. 60 tablet 1  . methocarbamol (ROBAXIN) 500 MG tablet TAKE 1 TABLET (500 MG TOTAL) BY MOUTH 2 (TWO) TIMES DAILY. TAKE DURING DAYTIME 60 tablet 2  . Multiple Vitamins-Minerals (MULTIVITAMIN WITH MINERALS)  tablet Take 1 tablet by mouth daily.    Marland Kitchen omeprazole (PRILOSEC) 40 MG capsule Take 1 capsule (40 mg total) by mouth daily. 30 capsule 3  . tizanidine (ZANAFLEX) 6 MG capsule Take 1 capsule (6 mg total) by mouth at bedtime. 30 capsule 2  . lisinopril (ZESTRIL) 10 MG tablet Take 1 tablet (10 mg total) by mouth daily. 30 tablet 0   No facility-administered medications prior to visit.      Review of Systems  Review of Systems  Constitutional: Negative for chills, fatigue, fever and unexpected weight change.  HENT: Positive for postnasal drip, rhinorrhea and sneezing. Negative for congestion.   Eyes: Negative for itching.  Respiratory: Positive for cough and shortness of breath. Negative for apnea, chest tightness and wheezing.   Cardiovascular: Negative for chest pain, palpitations and leg swelling.  Allergic/Immunologic: Positive for environmental allergies.     Physical Exam  BP (!) 142/80 (BP Location: Left Arm, Cuff Size: Normal)   Pulse  83   Temp (!) 97.1 F (36.2 C) (Temporal)   Ht 5' 3.75" (1.619 m)   Wt 219 lb 12.8 oz (99.7 kg)   SpO2 97%   BMI 38.03 kg/m  Physical Exam Constitutional:      Appearance: Normal appearance.  HENT:     Head: Normocephalic.     Mouth/Throat:     Mouth: Mucous membranes are moist.  Cardiovascular:     Rate and Rhythm: Normal rate and regular rhythm.  Pulmonary:     Effort: Pulmonary effort is normal.     Breath sounds: Normal breath sounds. No wheezing, rhonchi or rales.  Skin:    General: Skin is warm.  Neurological:     Mental Status: She is alert.  Psychiatric:        Mood and Affect: Mood normal.        Behavior: Behavior normal.      Lab Results:  CBC    Component Value Date/Time   WBC 7.0 07/04/2018 1119   RBC 5.12 (H) 07/04/2018 1119   HGB 15.1 (H) 07/04/2018 1119   HCT 46.9 (H) 07/04/2018 1119   PLT 344 07/04/2018 1119   MCV 91.6 07/04/2018 1119   MCH 29.5 07/04/2018 1119   MCHC 32.2 07/04/2018 1119   RDW 12.3  07/04/2018 1119   LYMPHSABS 1.4 07/04/2018 1119   MONOABS 0.4 07/04/2018 1119   EOSABS 0.1 07/04/2018 1119   BASOSABS 0.0 07/04/2018 1119    BMET    Component Value Date/Time   NA 136 10/13/2019 1612   K 3.9 10/13/2019 1612   CL 101 10/13/2019 1612   CO2 29 10/13/2019 1612   GLUCOSE 84 10/13/2019 1612   BUN 11 10/13/2019 1612   CREATININE 0.74 10/13/2019 1612   CREATININE 0.79 07/04/2018 1119   CALCIUM 9.4 10/13/2019 1612   GFRNONAA >60 07/04/2018 1119   GFRAA >60 07/04/2018 1119    BNP No results found for: BNP  ProBNP    Component Value Date/Time   PROBNP 20.0 10/16/2019 1116    Imaging: No results found.   Assessment & Plan:   GERD (gastroesophageal reflux disease) - Continue Omeprazole as prescribed - Started on Famotidine 20 mg BID due to worsening GERD symptoms and cough worse in the morning  Allergic rhinitis - Continue Zyrtec 10 mg daily and Nasanex nasal spray daily   COPD with asthma (HCC) - Continue Albuterol inhaler and nebulizer as needed for wheezing - Patient deferred any maintenance inhalers at this time as she has tried many such as Breo and Symbicort without any benefit. She states that her breathing is controlled and stable on her Albuterol medication. Patient will notify office if symptoms worsens. - Follow up in office in 6 months with Dr. Lamonte Sakai or APP   Hypertension - Peripheral edema improved after stopping amlodipine 5mg  during last visit. BP initially 110-120/60-70s with average 140-150/80 on Lisinopril 10mg  daily. - Start Lisinopril-HCTZ  20-12.5 mg once daily  - Continue to check blood pressure at home once a day after taking medications; notify office if BP gets less than 100/60 mmHG - Labs ordered: BMP - Follow up with PCP Pietro Cassis in [place] on May 10 to further work up her hypertension.     Martyn Ehrich, NP 11/06/2019

## 2019-11-06 NOTE — Assessment & Plan Note (Signed)
-   Continue Zyrtec 10 mg daily and Nasanex nasal spray daily

## 2019-11-06 NOTE — Patient Instructions (Addendum)
- Ordered Lisinopril-HCTZ  20-12.5 mg once daily  - Continue to check blood pressure at home once a day after taking medications; notify office if less than 100/60 mmHG - Labs ordered: BMP - Continue Albuterol as needed for COPD - Continue Zyrtec 10 mg and Nasanex for allergies - Start Famotidine 20 mg two times a day for GERD - Follow up with PCP on May 10 for blood pressure - Follow up in office in 6 months with Dr. Lamonte Sakai or APP    Hypertension, Adult High blood pressure (hypertension) is when the force of blood pumping through the arteries is too strong. The arteries are the blood vessels that carry blood from the heart throughout the body. Hypertension forces the heart to work harder to pump blood and may cause arteries to become narrow or stiff. Untreated or uncontrolled hypertension can cause a heart attack, heart failure, a stroke, kidney disease, and other problems. A blood pressure reading consists of a higher number over a lower number. Ideally, your blood pressure should be below 120/80. The first ("top") number is called the systolic pressure. It is a measure of the pressure in your arteries as your heart beats. The second ("bottom") number is called the diastolic pressure. It is a measure of the pressure in your arteries as the heart relaxes. What are the causes? The exact cause of this condition is not known. There are some conditions that result in or are related to high blood pressure. What increases the risk? Some risk factors for high blood pressure are under your control. The following factors may make you more likely to develop this condition:  Smoking.  Having type 2 diabetes mellitus, high cholesterol, or both.  Not getting enough exercise or physical activity.  Being overweight.  Having too much fat, sugar, calories, or salt (sodium) in your diet.  Drinking too much alcohol. Some risk factors for high blood pressure may be difficult or impossible to change. Some of  these factors include:  Having chronic kidney disease.  Having a family history of high blood pressure.  Age. Risk increases with age.  Race. You may be at higher risk if you are African American.  Gender. Men are at higher risk than women before age 56. After age 55, women are at higher risk than men.  Having obstructive sleep apnea.  Stress. What are the signs or symptoms? High blood pressure may not cause symptoms. Very high blood pressure (hypertensive crisis) may cause:  Headache.  Anxiety.  Shortness of breath.  Nosebleed.  Nausea and vomiting.  Vision changes.  Severe chest pain.  Seizures. How is this diagnosed? This condition is diagnosed by measuring your blood pressure while you are seated, with your arm resting on a flat surface, your legs uncrossed, and your feet flat on the floor. The cuff of the blood pressure monitor will be placed directly against the skin of your upper arm at the level of your heart. It should be measured at least twice using the same arm. Certain conditions can cause a difference in blood pressure between your right and left arms. Certain factors can cause blood pressure readings to be lower or higher than normal for a short period of time:  When your blood pressure is higher when you are in a health care provider's office than when you are at home, this is called white coat hypertension. Most people with this condition do not need medicines.  When your blood pressure is higher at home than when  you are in a health care provider's office, this is called masked hypertension. Most people with this condition may need medicines to control blood pressure. If you have a high blood pressure reading during one visit or you have normal blood pressure with other risk factors, you may be asked to:  Return on a different day to have your blood pressure checked again.  Monitor your blood pressure at home for 1 week or longer. If you are diagnosed  with hypertension, you may have other blood or imaging tests to help your health care provider understand your overall risk for other conditions. How is this treated? This condition is treated by making healthy lifestyle changes, such as eating healthy foods, exercising more, and reducing your alcohol intake. Your health care provider may prescribe medicine if lifestyle changes are not enough to get your blood pressure under control, and if:  Your systolic blood pressure is above 130.  Your diastolic blood pressure is above 80. Your personal target blood pressure may vary depending on your medical conditions, your age, and other factors. Follow these instructions at home: Eating and drinking   Eat a diet that is high in fiber and potassium, and low in sodium, added sugar, and fat. An example eating plan is called the DASH (Dietary Approaches to Stop Hypertension) diet. To eat this way: ? Eat plenty of fresh fruits and vegetables. Try to fill one half of your plate at each meal with fruits and vegetables. ? Eat whole grains, such as whole-wheat pasta, brown rice, or whole-grain bread. Fill about one fourth of your plate with whole grains. ? Eat or drink low-fat dairy products, such as skim milk or low-fat yogurt. ? Avoid fatty cuts of meat, processed or cured meats, and poultry with skin. Fill about one fourth of your plate with lean proteins, such as fish, chicken without skin, beans, eggs, or tofu. ? Avoid pre-made and processed foods. These tend to be higher in sodium, added sugar, and fat.  Reduce your daily sodium intake. Most people with hypertension should eat less than 1,500 mg of sodium a day.  Do not drink alcohol if: ? Your health care provider tells you not to drink. ? You are pregnant, may be pregnant, or are planning to become pregnant.  If you drink alcohol: ? Limit how much you use to:  0-1 drink a day for women.  0-2 drinks a day for men. ? Be aware of how much alcohol  is in your drink. In the [place] one drink equals one 12 oz bottle of beer (355 mL), one 5 oz glass of wine (148 mL), or one 1 oz glass of hard liquor (44 mL). Lifestyle   Work with your health care provider to maintain a healthy body weight or to lose weight. Ask what an ideal weight is for you.  Get at least 30 minutes of exercise most days of the week. Activities may include walking, swimming, or biking.  Include exercise to strengthen your muscles (resistance exercise), such as Pilates or lifting weights, as part of your weekly exercise routine. Try to do these types of exercises for 30 minutes at least 3 days a week.  Do not use any products that contain nicotine or tobacco, such as cigarettes, e-cigarettes, and chewing tobacco. If you need help quitting, ask your health care provider.  Monitor your blood pressure at home as told by your health care provider.  Keep all follow-up visits as told by your health care provider. This  is important. Medicines  Take over-the-counter and prescription medicines only as told by your health care provider. Follow directions carefully. Blood pressure medicines must be taken as prescribed.  Do not skip doses of blood pressure medicine. Doing this puts you at risk for problems and can make the medicine less effective.  Ask your health care provider about side effects or reactions to medicines that you should watch for. Contact a health care provider if you:  Think you are having a reaction to a medicine you are taking.  Have headaches that keep coming back (recurring).  Feel dizzy.  Have swelling in your ankles.  Have trouble with your vision. Get help right away if you:  Develop a severe headache or confusion.  Have unusual weakness or numbness.  Feel faint.  Have severe pain in your chest or abdomen.  Vomit repeatedly.  Have trouble breathing. Summary  Hypertension is when the force of blood pumping through your arteries is too  strong. If this condition is not controlled, it may put you at risk for serious complications.  Your personal target blood pressure may vary depending on your medical conditions, your age, and other factors. For most people, a normal blood pressure is less than 120/80.  Hypertension is treated with lifestyle changes, medicines, or a combination of both. Lifestyle changes include losing weight, eating a healthy, low-sodium diet, exercising more, and limiting alcohol. This information is not intended to replace advice given to you by your health care provider. Make sure you discuss any questions you have with your health care provider. Document Revised: 03/13/2018 Document Reviewed: 03/13/2018 Elsevier Patient Education  2020 [employer].

## 2019-11-06 NOTE — Assessment & Plan Note (Addendum)
-   Peripheral edema improved after stopping amlodipine 5mg  during last visit. BP initially 110-120/60-70s with average 140-150/80 on Lisinopril 10mg  daily. - Start Lisinopril-HCTZ  20-12.5 mg once daily  - Continue to check blood pressure at home once a day after taking medications; notify office if BP gets less than 100/60 mmHG - Labs ordered: BMP - Follow up with PCP Pietro Cassis in [place] on May 10 to further work up her hypertension.

## 2019-11-06 NOTE — Progress Notes (Signed)
   Covid-19 Vaccination Clinic  Name:  Camylle Parham    MRN: [id number] DOB: 1956-04-01  11/06/2019  Ms. Demont was observed post Covid-19 immunization for 30 minutes based on pre-vaccination screening without incident. She was provided with Vaccine Information Sheet and instruction to access the V-Safe system.   Ms. Pfeil was instructed to call 911 with any severe reactions post vaccine: Marland Kitchen Difficulty breathing  . Swelling of face and throat  . A fast heartbeat  . A bad rash all over body  . Dizziness and weakness   Immunizations Administered    Name Date Dose VIS Date Route   Pfizer COVID-19 Vaccine 11/06/2019  5:07 PM 0.3 mL 09/10/2018 Intramuscular   Manufacturer: [place]   Lot: [id number]   [place]: [id number]

## 2019-11-06 NOTE — Assessment & Plan Note (Signed)
-   Continue Omeprazole as prescribed - Started on Famotidine 20 mg BID due to worsening GERD symptoms and cough worse in the morning

## 2019-11-07 NOTE — Progress Notes (Signed)
Please let patient know her labs were stable. Continue medication for her blood pressure.

## 2019-11-18 MED FILL — ALBUTEROL 0.083 MG/ML SOLN: (2.5 MG/3ML | 8 days supply | Qty: 90 | Fill #0

## 2019-11-18 MED FILL — ALBUTEROL SULFATE HFA 108 (: 108 (90 BAS | 25 days supply | Qty: 18 | Fill #1

## 2019-11-18 MED FILL — OMEPRAZOLE 40 MG CPDR: 40 | 30 days supply | Qty: 30 | Fill #1

## 2019-11-19 ENCOUNTER — Other Ambulatory Visit: Payer: Self-pay

## 2019-11-19 DIAGNOSIS — J449 Chronic obstructive pulmonary disease, unspecified: Secondary | ICD-10-CM

## 2019-11-19 MED ORDER — ALBUTEROL SULFATE (2.5 MG/3ML) 0.083% IN NEBU: 2.5000 mg | INHALATION_SOLUTION | Freq: Four times a day (QID) | RESPIRATORY_TRACT | 5 refills | Status: DC | PRN

## 2019-11-24 ENCOUNTER — Encounter: Payer: Self-pay | Admitting: Family

## 2019-11-24 ENCOUNTER — Other Ambulatory Visit: Payer: Self-pay

## 2019-11-24 ENCOUNTER — Ambulatory Visit (INDEPENDENT_AMBULATORY_CARE_PROVIDER_SITE_OTHER): Payer: 59 | Admitting: Family

## 2019-11-24 VITALS — BP 132/84 | HR 86 | Temp 98.2°F | Ht 63.75 in | Wt 216.6 lb

## 2019-11-24 DIAGNOSIS — R252 Cramp and spasm: Secondary | ICD-10-CM | POA: Diagnosis not present

## 2019-11-24 DIAGNOSIS — I10 Essential (primary) hypertension: Secondary | ICD-10-CM | POA: Diagnosis not present

## 2019-11-24 DIAGNOSIS — R140 Abdominal distension (gaseous): Secondary | ICD-10-CM

## 2019-11-24 DIAGNOSIS — Z1231 Encounter for screening mammogram for malignant neoplasm of breast: Secondary | ICD-10-CM

## 2019-11-24 DIAGNOSIS — M791 Myalgia, unspecified site: Secondary | ICD-10-CM

## 2019-11-24 LAB — CBC WITH DIFFERENTIAL/PLATELET
Basophils Absolute: 0.1 10*3/uL (ref 0.0–0.1)
Basophils Relative: 0.7 % (ref 0.0–3.0)
Eosinophils Absolute: 0.2 10*3/uL (ref 0.0–0.7)
Eosinophils Relative: 2.6 % (ref 0.0–5.0)
HCT: 43.1 % (ref 36.0–46.0)
Hemoglobin: 14.6 g/dL (ref 12.0–15.0)
Lymphocytes Relative: 24.5 % (ref 12.0–46.0)
Lymphs Abs: 2.3 10*3/uL (ref 0.7–4.0)
MCHC: 33.9 g/dL (ref 30.0–36.0)
MCV: 91.2 fl (ref 78.0–100.0)
Monocytes Absolute: 0.6 10*3/uL (ref 0.1–1.0)
Monocytes Relative: 6.6 % (ref 3.0–12.0)
Neutro Abs: 6.2 10*3/uL (ref 1.4–7.7)
Neutrophils Relative %: 65.6 % (ref 43.0–77.0)
Platelets: 332 10*3/uL (ref 150.0–400.0)
RBC: 4.72 Mil/uL (ref 3.87–5.11)
RDW: 13.5 % (ref 11.5–15.5)
WBC: 9.4 10*3/uL (ref 4.0–10.5)

## 2019-11-24 LAB — COMPREHENSIVE METABOLIC PANEL
ALT: 26 U/L (ref 0–35)
AST: 20 U/L (ref 0–37)
Albumin: 4.2 g/dL (ref 3.5–5.2)
Alkaline Phosphatase: 95 U/L (ref 39–117)
BUN: 18 mg/dL (ref 6–23)
CO2: 30 mEq/L (ref 19–32)
Calcium: 9.8 mg/dL (ref 8.4–10.5)
Chloride: 99 mEq/L (ref 96–112)
Creatinine, Ser: 0.78 mg/dL (ref 0.40–1.20)
GFR: 74.35 mL/min (ref >60.00)
Glucose, Bld: 105 mg/dL — ABNORMAL HIGH (ref 70–99)
Potassium: 4.2 mEq/L (ref 3.5–5.1)
Sodium: 135 mEq/L (ref 135–145)
Total Bilirubin: 0.6 mg/dL (ref 0.2–1.2)
Total Protein: 7.2 g/dL (ref 6.0–8.3)

## 2019-11-24 LAB — URIC ACID: Uric Acid, Serum: 8.1 mg/dL — ABNORMAL HIGH (ref 2.4–7.0)

## 2019-11-24 LAB — TSH: TSH: 2.37 u[IU]/mL (ref 0.35–4.50)

## 2019-11-24 LAB — VITAMIN B12: Vitamin B-12: 491 pg/mL (ref 211–911)

## 2019-11-24 LAB — MAGNESIUM: Magnesium: 2 mg/dL (ref 1.5–2.5)

## 2019-11-24 MED ORDER — LISINOPRIL 20 MG PO TABS
20.0000 mg | ORAL_TABLET | Freq: Every day | ORAL | 0 refills | Status: DC
Start: 2019-11-24 — End: 2019-12-09

## 2019-11-24 MED ORDER — SPIRONOLACTONE 25 MG PO TABS
25.0000 mg | ORAL_TABLET | Freq: Every day | ORAL | 0 refills | Status: DC
Start: 2019-11-24 — End: 2019-12-26

## 2019-11-24 NOTE — Progress Notes (Signed)
Frances Nelson is a 64 y.o. female with the following history as recorded in EpicCare:  Patient Active Problem List   Diagnosis Date Noted  . GERD (gastroesophageal reflux disease) 11/06/2019  . Bilateral leg edema 10/16/2019  . Pleuritic chest pain 10/16/2019  . Hypertension 10/02/2019  . Nasal polyps 10/02/2019  . Arthralgia 06/28/2018  . Sinusitis 06/28/2018  . Renal cyst 05/03/2018  . Syncope 05/03/2018  . Stress and adjustment reaction 05/03/2018  . History of tobacco use 05/02/2018  . Primary osteoarthritis of both hands 04/10/2018  . Primary osteoarthritis of both knees 04/10/2018  . Primary osteoarthritis of both feet 04/10/2018  . Achilles tendinitis 04/02/2018  . Screening for breast cancer 03/15/2018  . Positive ANA (antinuclear antibody) 03/15/2018  . Screening for colon cancer 03/15/2018  . Rash 03/15/2018  . Achilles tendon pain 11/13/2017  . Cervical myelopathy with cervical radiculopathy 11/13/2017  . Elevated blood pressure reading without diagnosis of hypertension 11/13/2017  . 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 Allergic rhinitis 06/19/2017    Current Outpatient Medications  Medication Sig Dispense Refill  . acidophilus (RISAQUAD) CAPS capsule Take 1 capsule by mouth daily.    Marland Kitchen albuterol (PROVENTIL) (2.5 MG/3ML) 0.083% nebulizer solution Take 3 mLs (2.5 mg total) by nebulization every 6 (six) hours as needed for wheezing or shortness of breath. 360 mL 5  . albuterol (VENTOLIN HFA) 108 (90 Base) MCG/ACT inhaler Inhale 2 puffs into the lungs every 6 (six) hours as needed for wheezing or shortness of breath. 8 g 5  . cetirizine (ZYRTEC) 10 MG tablet Take 10 mg by mouth daily.    . Cholecalciferol (HM VITAMIN D3) 100 MCG (4000 UT) CAPS Take 4,000 Units by mouth daily.    . Diclofenac Sodium (PENNSAID) 2 % SOLN Place 1 application onto the skin 2 (two) times daily. (Patient taking differently: Place 1 application onto the skin 2 (two) times daily. As needed) 1  Bottle 3  . famotidine (PEPCID) 20 MG tablet Take 1 tablet (20 mg total) by mouth 2 (two) times daily. 30 tablet 0  . ibuprofen (ADVIL,MOTRIN) 800 MG tablet Take 1 tablet (800 mg total) by mouth every 8 (eight) hours as needed. 60 tablet 1  . methocarbamol (ROBAXIN) 500 MG tablet TAKE 1 TABLET (500 MG TOTAL) BY MOUTH 2 (TWO) TIMES DAILY. TAKE DURING DAYTIME 60 tablet 2  . Multiple Vitamins-Minerals (MULTIVITAMIN WITH MINERALS) tablet Take 1 tablet by mouth daily.    Marland Kitchen omeprazole (PRILOSEC) 40 MG capsule Take 1 capsule (40 mg total) by mouth daily. 30 capsule 3  . Cranberry-Cholecalciferol 4200-500 MG-UNIT CAPS Take 4,200 Units by mouth daily.    Marland Kitchen lisinopril (ZESTRIL) 20 MG tablet Take 1 tablet (20 mg total) by mouth daily. 90 tablet 0  . spironolactone (ALDACTONE) 25 MG tablet Take 1 tablet (25 mg total) by mouth daily. 90 tablet 0  . tizanidine (ZANAFLEX) 6 MG capsule Take 1 capsule (6 mg total) by mouth at bedtime. (Patient not taking: Reported on 11/24/2019) 30 capsule 2   No current facility-administered medications for this visit.    Allergies: Symbicort [budesonide-formoterol fumarate], Celebrex [celecoxib], Chocolate flavor, Ciprofloxacin, Eggs or egg-derived products, Flavoring agent, Gabapentin, Lyrica [pregabalin], Tramadol, Wellbutrin [bupropion], and Codeine  Past Medical History:  Diagnosis Date  . Asthma     Past Surgical History:  Procedure Laterality Date  . ABDOMINAL HYSTERECTOMY    . APPENDECTOMY    . HERNIA REPAIR     Umbilical x 3  . TONSILLECTOMY  Family History  Problem Relation Age of Onset  . Leukemia Sister   . Brain cancer Brother        glioblastoma   . Asthma Son   . Allergies Son     Social History   Tobacco Use  . Smoking status: Current Every Day Smoker    Packs/day: 1.00    Years: 40.00    Pack years: 40.00    Types: Cigarettes    Last attempt to quit: 11/14/2017    Years since quitting: 2.0  . Smokeless tobacco: Never Used  . Tobacco  comment: Restarted Sept. 2020  Substance Use Topics  . Alcohol use: No    Subjective:  Presents to transfer care from [place] location; notes her former PCP has recently moved to [place]; Having difficulty getting her blood pressure controlled- was originally started on Amlodipine but could not tolerate due to swelling in legs; was then changed to Lisinopril 10 mg daily but blood pressure was not controlled; since adding the HCTZ she feels that her muscle cramps are  worse;  ? History of fibromyalgia- has seen rheumatology on numerous occasions; could not tolerate Gabapentin or Lyrica; has been managing with muscle relaxers including Tizanidine and Robaxin.  + smoker-  1 ppd; 40+ pack year history; knows she needs to quit Also mentions 3-4 year history of feeling that her abdomen is "swollen and rock hard." Has not had any imaging; s/p partial hysterectomy- both ovaries still present;     Objective:  Vitals:   11/24/19 1012  BP: 132/84  Pulse: 86  Temp: 98.2 F (36.8 C)  TempSrc: Oral  SpO2: 96%  Weight: 216 lb 9.6 oz (98.2 kg)  Height: 5' 3.75" (1.619 m)    General: Well developed, well nourished, in no acute distress  Skin : Warm and dry.  Head: Normocephalic and atraumatic  Eyes: Sclera and conjunctiva clear; pupils round and reactive to light; extraocular movements intact  Ears: External normal; canals clear; tympanic membranes normal  Oropharynx: Pink, supple. No suspicious lesions  Neck: Supple without thyromegaly, adenopathy  Lungs: Respirations unlabored; clear to auscultation bilaterally without wheeze, rales, rhonchi  CVS exam: normal rate and regular rhythm.  Musculoskeletal: No deformities; no active joint inflammation  Extremities: No edema, cyanosis, clubbing  Vessels: Symmetric bilaterally  Neurologic: Alert and oriented; speech intact; face symmetrical; moves all extremities well; CNII-XII intact without focal deficit   Assessment:  1. Essential  hypertension   2. Muscle cramps   3. Screening mammogram, encounter for   4. Myalgia   5. Abdominal distension (gaseous)     Plan:  1. D/C Lisinopril HCT; change to Lisinopril and Spironolactone; difficult to determine if the cramps she is experiencing are actually new symptoms; it sounds like she has been struggling with chronic cramps for years; follow-up in 1 month to evaluate; she does feel that she needs some type of diuretic due to persistent swelling of her lower extremities. 2. In reviewing notes, it sounds like most recent rheumatology evaluation was questioning fibromyalgia; update labs today to make sure no lab explanation for her symptoms; will consider updating refills on Robaxin or Tizanidine based on lab results. 3. Order updated; patient is adamant that she has not had a mammogram since the 1990s but there is a result listed for her from 2019 from the [facility]? 5. Update abdominal/ pelvic CT due to 3-4 year history of subjective symptoms.  Return in about 1 month (around 12/25/2019).  Orders Placed This Encounter  Procedures  .  MM Digital Screening    Standing Status:   Future    Standing Expiration Date:   01/23/2021    Order Specific Question:   Reason for Exam (SYMPTOM  OR DIAGNOSIS REQUIRED)    Answer:   screening mammogram    Order Specific Question:   Preferred imaging location?    Answer:   [facility]  . CT Abdomen Pelvis W Contrast    Standing Status:   Future    Standing Expiration Date:   02/23/2021    Order Specific Question:   If indicated for the ordered procedure, I authorize the administration of contrast media per Radiology protocol    Answer:   Yes    Order Specific Question:   Preferred imaging location?    Answer:   [address]    Order Specific Question:   Is Oral Contrast requested for this exam?    Answer:   Yes, Per Radiology protocol    Order Specific Question:   Radiology Contrast Protocol - do NOT remove file path    Answer:    [email or web address]  . Magnesium  . Comp Met (CMET)  . B12  . TSH  . CBC with Differential/Platelet  . Uric acid    Requested Prescriptions   Signed Prescriptions Disp Refills  . lisinopril (ZESTRIL) 20 MG tablet 90 tablet 0    Sig: Take 1 tablet (20 mg total) by mouth daily.  Marland Kitchen spironolactone (ALDACTONE) 25 MG tablet 90 tablet 0    Sig: Take 1 tablet (25 mg total) by mouth daily.

## 2019-11-25 ENCOUNTER — Encounter: Payer: Self-pay | Admitting: Family

## 2019-12-01 ENCOUNTER — Ambulatory Visit: Payer: 59 | Attending: Internal Medicine

## 2019-12-01 DIAGNOSIS — Z23 Encounter for immunization: Secondary | ICD-10-CM

## 2019-12-01 NOTE — Progress Notes (Signed)
   Covid-19 Vaccination Clinic  Name:  Frances Nelson    MRN: [id number] DOB: 1956/06/19  12/01/2019  Frances Nelson was observed post Covid-19 immunization for 15 minutes without incident. She was provided with Vaccine Information Sheet and instruction to access the V-Safe system.   Frances Nelson was instructed to call 911 with any severe reactions post vaccine: Marland Kitchen Difficulty breathing  . Swelling of face and throat  . A fast heartbeat  . A bad rash all over body  . Dizziness and weakness   Immunizations Administered    Name Date Dose VIS Date Route   Pfizer COVID-19 Vaccine 12/01/2019  4:59 PM 0.3 mL 09/10/2018 Intramuscular   Manufacturer: [place]   Lot: [id number]   [place]: [id number]

## 2019-12-02 ENCOUNTER — Other Ambulatory Visit: Payer: Self-pay | Admitting: Family

## 2019-12-02 ENCOUNTER — Telehealth: Payer: Self-pay | Admitting: Family

## 2019-12-02 DIAGNOSIS — I10 Essential (primary) hypertension: Secondary | ICD-10-CM

## 2019-12-02 DIAGNOSIS — R140 Abdominal distension (gaseous): Secondary | ICD-10-CM

## 2019-12-02 MED ORDER — ALLOPURINOL 100 MG PO TABS
100.0000 mg | ORAL_TABLET | Freq: Every day | ORAL | 0 refills | Status: DC
Start: 2019-12-02 — End: 2019-12-29

## 2019-12-02 MED ORDER — LISINOPRIL-HYDROCHLOROTHIAZIDE 20-12.5 MG PO TABS: 1.0000 | ORAL_TABLET | Freq: Every day | ORAL | 3 refills | Status: DC

## 2019-12-02 MED FILL — ALLOPURINOL 100 MG TABS: 100 | 90 days supply | Qty: 90 | Fill #0

## 2019-12-02 NOTE — Telephone Encounter (Signed)
Spoke with patient and info given 

## 2019-12-02 NOTE — Telephone Encounter (Signed)
Please let her know that her insurance will not cover the abdomnal CT that we discussed until we schedule an abdominal ultrasound first. We will get that ordered for her and someone will be in touch about scheduling.

## 2019-12-04 ENCOUNTER — Ambulatory Visit
Admission: RE | Admit: 2019-12-04 | Discharge: 2019-12-04 | Disposition: A | Discharge status: Home / Self Care | Payer: 59 | Source: Ambulatory Visit | Attending: Family | Admitting: Family

## 2019-12-04 DIAGNOSIS — R140 Abdominal distension (gaseous): Secondary | ICD-10-CM

## 2019-12-09 ENCOUNTER — Other Ambulatory Visit: Payer: Self-pay | Admitting: Family

## 2019-12-09 ENCOUNTER — Encounter: Payer: Self-pay | Admitting: Family

## 2019-12-09 MED ORDER — METHOCARBAMOL 500 MG PO TABS: 500.0000 mg | ORAL_TABLET | Freq: Four times a day (QID) | ORAL | 2 refills | Status: DC | PRN

## 2019-12-09 MED FILL — METHOCARBAMOL 500 MG TABS: 500 | 15 days supply | Qty: 60 | Fill #0

## 2019-12-26 ENCOUNTER — Encounter: Payer: Self-pay | Admitting: Family

## 2019-12-26 ENCOUNTER — Other Ambulatory Visit: Payer: Self-pay

## 2019-12-26 ENCOUNTER — Ambulatory Visit (INDEPENDENT_AMBULATORY_CARE_PROVIDER_SITE_OTHER): Payer: 59 | Admitting: Family

## 2019-12-26 VITALS — BP 122/78 | HR 95 | Temp 98.2°F | Ht 63.75 in | Wt 211.0 lb

## 2019-12-26 DIAGNOSIS — J449 Chronic obstructive pulmonary disease, unspecified: Secondary | ICD-10-CM | POA: Diagnosis not present

## 2019-12-26 DIAGNOSIS — E790 Hyperuricemia without signs of inflammatory arthritis and tophaceous disease: Secondary | ICD-10-CM

## 2019-12-26 DIAGNOSIS — M791 Myalgia, unspecified site: Secondary | ICD-10-CM | POA: Diagnosis not present

## 2019-12-26 DIAGNOSIS — I10 Essential (primary) hypertension: Secondary | ICD-10-CM

## 2019-12-26 LAB — COMPREHENSIVE METABOLIC PANEL
ALT: 31 U/L (ref 0–35)
AST: 25 U/L (ref 0–37)
Albumin: 4.3 g/dL (ref 3.5–5.2)
Alkaline Phosphatase: 102 U/L (ref 39–117)
BUN: 18 mg/dL (ref 6–23)
CO2: 30 mEq/L (ref 19–32)
Calcium: 9.8 mg/dL (ref 8.4–10.5)
Chloride: 99 mEq/L (ref 96–112)
Creatinine, Ser: 0.75 mg/dL (ref 0.40–1.20)
GFR: 77.77 mL/min (ref >60.00)
Glucose, Bld: 105 mg/dL — ABNORMAL HIGH (ref 70–99)
Potassium: 4.2 mEq/L (ref 3.5–5.1)
Sodium: 135 mEq/L (ref 135–145)
Total Bilirubin: 0.5 mg/dL (ref 0.2–1.2)
Total Protein: 7.4 g/dL (ref 6.0–8.3)

## 2019-12-26 LAB — URIC ACID: Uric Acid, Serum: 6 mg/dL (ref 2.4–7.0)

## 2019-12-26 MED ORDER — TIZANIDINE HCL 6 MG PO CAPS: 6.0000 mg | ORAL_CAPSULE | Freq: Every day | ORAL | 1 refills | Status: DC

## 2019-12-26 MED ORDER — DULOXETINE HCL 30 MG PO CPEP
30.0000 mg | ORAL_CAPSULE | Freq: Every day | ORAL | 1 refills | Status: DC
Start: 2019-12-26 — End: 2020-04-09

## 2019-12-26 NOTE — Progress Notes (Signed)
Frances Nelson is a 64 y.o. female with the following history as recorded in EpicCare:  Patient Active Problem List   Diagnosis Date Noted  . GERD (gastroesophageal reflux disease) 11/06/2019  . Bilateral leg edema 10/16/2019  . Pleuritic chest pain 10/16/2019  . Hypertension 10/02/2019  . Nasal polyps 10/02/2019  . Arthralgia 06/28/2018  . Sinusitis 06/28/2018  . Renal cyst 05/03/2018  . Syncope 05/03/2018  . Stress and adjustment reaction 05/03/2018  . History of tobacco use 05/02/2018  . Primary osteoarthritis of both hands 04/10/2018  . Primary osteoarthritis of both knees 04/10/2018  . Primary osteoarthritis of both feet 04/10/2018  . Achilles tendinitis 04/02/2018  . Screening for breast cancer 03/15/2018  . Positive ANA (antinuclear antibody) 03/15/2018  . Screening for colon cancer 03/15/2018  . Rash 03/15/2018  . Achilles tendon pain 11/13/2017  . Cervical myelopathy with cervical radiculopathy 11/13/2017  . Elevated blood pressure reading without diagnosis of hypertension 11/13/2017  . 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 Allergic rhinitis 06/19/2017    Current Outpatient Medications  Medication Sig Dispense Refill  . acidophilus (RISAQUAD) CAPS capsule Take 1 capsule by mouth daily.    Marland Kitchen albuterol (PROVENTIL) (2.5 MG/3ML) 0.083% nebulizer solution Take 3 mLs (2.5 mg total) by nebulization every 6 (six) hours as needed for wheezing or shortness of breath. 360 mL 5  . albuterol (VENTOLIN HFA) 108 (90 Base) MCG/ACT inhaler Inhale 2 puffs into the lungs every 6 (six) hours as needed for wheezing or shortness of breath. 8 g 5  . allopurinol (ZYLOPRIM) 100 MG tablet Take 1 tablet (100 mg total) by mouth daily. 90 tablet 0  . cetirizine (ZYRTEC) 10 MG tablet Take 10 mg by mouth daily.    . Cholecalciferol (HM VITAMIN D3) 100 MCG (4000 UT) CAPS Take 4,000 Units by mouth daily.    . Cranberry-Cholecalciferol 4200-500 MG-UNIT CAPS Take 4,200 Units by mouth daily.    .  Diclofenac Sodium (PENNSAID) 2 % SOLN Place 1 application onto the skin 2 (two) times daily. (Patient taking differently: Place 1 application onto the skin 2 (two) times daily. As needed) 1 Bottle 3  . lisinopril-hydrochlorothiazide (ZESTORETIC) 20-12.5 MG tablet Take 1 tablet by mouth daily. 30 tablet 3  . methocarbamol (ROBAXIN) 500 MG tablet Take 1 tablet (500 mg total) by mouth every 6 (six) hours as needed for muscle spasms. 60 tablet 2  . Multiple Vitamins-Minerals (MULTIVITAMIN WITH MINERALS) tablet Take 1 tablet by mouth daily.    Marland Kitchen omeprazole (PRILOSEC) 40 MG capsule Take 1 capsule (40 mg total) by mouth daily. 30 capsule 3  . DULoxetine (CYMBALTA) 30 MG capsule Take 1 capsule (30 mg total) by mouth daily. 30 capsule 1  . tizanidine (ZANAFLEX) 6 MG capsule Take 1 capsule (6 mg total) by mouth at bedtime. 90 capsule 1   No current facility-administered medications for this visit.    Allergies: Symbicort [budesonide-formoterol fumarate], Celebrex [celecoxib], Chocolate flavor, Ciprofloxacin, Eggs or egg-derived products, Flavoring agent, Gabapentin, Lyrica [pregabalin], Tramadol, Wellbutrin [bupropion], and Codeine  Past Medical History:  Diagnosis Date  . Asthma     Past Surgical History:  Procedure Laterality Date  . ABDOMINAL HYSTERECTOMY    . APPENDECTOMY    . HERNIA REPAIR     Umbilical x 3  . TONSILLECTOMY      Family History  Problem Relation Age of Onset  . Leukemia Sister   . Brain cancer Brother        glioblastoma   . Asthma  Son   . Allergies Son     Social History   Tobacco Use  . Smoking status: Current Every Day Smoker    Packs/day: 1.00    Years: 40.00    Pack years: 40.00    Types: Cigarettes    Last attempt to quit: 11/14/2017    Years since quitting: 2.1  . Smokeless tobacco: Never Used  . Tobacco comment: Restarted Sept. 2020  Substance Use Topics  . Alcohol use: No    Subjective:   1 month follow-up on hypertension- comfortable continuing  Lisinopril HCT; Feeling better with addition of Allopurinol but still has chronic aching; Discussed that in reviewing previous records question of fibromyalgia raised and she is in agreement with that diagnosis; could not tolerate Lyrica or Gabapentin; Denies any chest pain, shortness of breath, blurred vision or headache   Objective:  Vitals:   12/26/19 1315  BP: 122/78  Pulse: 95  Temp: 98.2 F (36.8 C)  TempSrc: Oral  SpO2: 97%  Weight: 211 lb (95.7 kg)  Height: 5' 3.75" (1.619 m)    General: Well developed, well nourished, in no acute distress  Skin : Warm and dry.  Head: Normocephalic and atraumatic  Eyes: Sclera and conjunctiva clear; pupils round and reactive to light; extraocular movements intact  Ears: External normal; canals clear; tympanic membranes normal  Oropharynx: Pink, supple. No suspicious lesions  Neck: Supple without thyromegaly, adenopathy  Lungs: Respirations unlabored; occasional wheeze noted on RLL CVS exam: normal rate and regular rhythm.  Musculoskeletal: No deformities; no active joint inflammation  Extremities: No edema, cyanosis, clubbing  Vessels: Symmetric bilaterally  Neurologic: Alert and oriented; speech intact; face symmetrical; moves all extremities well; CNII-XII intact without focal deficit   Assessment:  1. Elevated blood uric acid level   2. Essential hypertension   3. COPD with asthma ([place])   4. Myalgia     Plan:  1. Check CMP, uric acid level today; will adjust Allopurinol as needed; 2. Stable; stay on Lisinpril HCT; 3. Continue with pulmonology as scheduled; 4. Trial of Cymbalta 30 mg daily- risks/ benefits discussed; she will call back with her response in 2 week; may need to increase dosage; also refill given on Tizanidine;  This visit occurred during the SARS-CoV-2 public health emergency.  Safety protocols were in place, including screening questions prior to the visit, additional usage of staff PPE, and extensive cleaning of  exam room while observing appropriate contact time as indicated for disinfecting solutions.     No follow-ups on file.  Orders Placed This Encounter  Procedures  . Uric acid  . Comp Met (CMET)    Requested Prescriptions   Signed Prescriptions Disp Refills  . tizanidine (ZANAFLEX) 6 MG capsule 90 capsule 1    Sig: Take 1 capsule (6 mg total) by mouth at bedtime.  . DULoxetine (CYMBALTA) 30 MG capsule 30 capsule 1    Sig: Take 1 capsule (30 mg total) by mouth daily.

## 2019-12-29 ENCOUNTER — Other Ambulatory Visit: Payer: Self-pay | Admitting: Family

## 2019-12-29 MED ORDER — ALLOPURINOL 100 MG PO TABS: 100.0000 mg | ORAL_TABLET | Freq: Every day | ORAL | 3 refills | Status: DC

## 2020-01-06 MED FILL — LISINOPRIL-HCTZ 20-12.5 MG: 20-12.5 | 60 days supply | Qty: 60 | Fill #1

## 2020-01-06 MED FILL — ALBUTEROL 0.083 MG/ML SOLN: (2.5 MG/3ML | 25 days supply | Qty: 360 | Fill #0

## 2020-01-13 ENCOUNTER — Ambulatory Visit: Payer: 59

## 2020-01-16 ENCOUNTER — Ambulatory Visit
Admission: RE | Admit: 2020-01-16 | Discharge: 2020-01-16 | Disposition: A | Discharge status: Home / Self Care | Payer: 59 | Source: Ambulatory Visit | Attending: Family | Admitting: Family

## 2020-01-16 ENCOUNTER — Other Ambulatory Visit: Payer: Self-pay

## 2020-01-16 DIAGNOSIS — Z1231 Encounter for screening mammogram for malignant neoplasm of breast: Secondary | ICD-10-CM

## 2020-01-20 ENCOUNTER — Encounter: Payer: Self-pay | Admitting: Family

## 2020-01-29 MED FILL — OMEPRAZOLE 40 MG CPDR: 40 | 30 days supply | Qty: 30 | Fill #3

## 2020-01-29 MED FILL — DULoxetine HCL 30 MG CPEP: 30 | 30 days supply | Qty: 30 | Fill #1

## 2020-01-29 MED FILL — ALBUTEROL SULFATE HFA 108 (: 108 (90 BAS | 25 days supply | Qty: 18 | Fill #2

## 2020-04-09 ENCOUNTER — Other Ambulatory Visit: Payer: Self-pay

## 2020-04-09 ENCOUNTER — Ambulatory Visit (INDEPENDENT_AMBULATORY_CARE_PROVIDER_SITE_OTHER): Payer: 59

## 2020-04-09 ENCOUNTER — Telehealth: Payer: Self-pay | Admitting: Family

## 2020-04-09 ENCOUNTER — Ambulatory Visit (INDEPENDENT_AMBULATORY_CARE_PROVIDER_SITE_OTHER): Payer: 59 | Admitting: Family

## 2020-04-09 ENCOUNTER — Encounter: Payer: Self-pay | Admitting: Family

## 2020-04-09 VITALS — BP 122/72 | HR 103 | Temp 98.1°F | Ht 63.75 in | Wt 207.0 lb

## 2020-04-09 DIAGNOSIS — M542 Cervicalgia: Secondary | ICD-10-CM | POA: Diagnosis not present

## 2020-04-09 DIAGNOSIS — J019 Acute sinusitis, unspecified: Secondary | ICD-10-CM | POA: Diagnosis not present

## 2020-04-09 DIAGNOSIS — G8929 Other chronic pain: Secondary | ICD-10-CM

## 2020-04-09 DIAGNOSIS — Z720 Tobacco use: Secondary | ICD-10-CM

## 2020-04-09 DIAGNOSIS — M79606 Pain in leg, unspecified: Secondary | ICD-10-CM | POA: Diagnosis not present

## 2020-04-09 MED ORDER — CHANTIX STARTING MONTH PAK 0.5 MG X 11 & 1 MG X 42 PO TABS: ORAL_TABLET | ORAL | 0 refills | Status: DC

## 2020-04-09 MED ORDER — DOXYCYCLINE HYCLATE 100 MG PO TABS
100.0000 mg | ORAL_TABLET | Freq: Two times a day (BID) | ORAL | 0 refills | Status: DC
Start: 2020-04-09 — End: 2020-04-27

## 2020-04-09 NOTE — Telephone Encounter (Signed)
Spoke with patient.

## 2020-04-09 NOTE — Progress Notes (Signed)
Frances Nelson is a 64 y.o. female with the following history as recorded in EpicCare:  Patient Active Problem List   Diagnosis Date Noted  . GERD (gastroesophageal reflux disease) 11/06/2019  . Bilateral leg edema 10/16/2019  . Pleuritic chest pain 10/16/2019  . Hypertension 10/02/2019  . Nasal polyps 10/02/2019  . Arthralgia 06/28/2018  . Sinusitis 06/28/2018  . Renal cyst 05/03/2018  . Syncope 05/03/2018  . Stress and adjustment reaction 05/03/2018  . History of tobacco use 05/02/2018  . Primary osteoarthritis of both hands 04/10/2018  . Primary osteoarthritis of both knees 04/10/2018  . Primary osteoarthritis of both feet 04/10/2018  . Achilles tendinitis 04/02/2018  . Screening for breast cancer 03/15/2018  . Positive ANA (antinuclear antibody) 03/15/2018  . Screening for colon cancer 03/15/2018  . Rash 03/15/2018  . Achilles tendon pain 11/13/2017  . Cervical myelopathy with cervical radiculopathy 11/13/2017  . Elevated blood pressure reading without diagnosis of hypertension 11/13/2017  . COPD with asthma ([place]) 06/19/2017  . Allergic rhinitis 06/19/2017    Current Outpatient Medications  Medication Sig Dispense Refill  . acidophilus (RISAQUAD) CAPS capsule Take 1 capsule by mouth daily.    Marland Kitchen albuterol (PROVENTIL) (2.5 MG/3ML) 0.083% nebulizer solution Take 3 mLs (2.5 mg total) by nebulization every 6 (six) hours as needed for wheezing or shortness of breath. 360 mL 5  . albuterol (VENTOLIN HFA) 108 (90 Base) MCG/ACT inhaler Inhale 2 puffs into the lungs every 6 (six) hours as needed for wheezing or shortness of breath. 8 g 5  . allopurinol (ZYLOPRIM) 100 MG tablet Take 1 tablet (100 mg total) by mouth daily. (Patient taking differently: Take 50 mg by mouth daily. ) 90 tablet 3  . cetirizine (ZYRTEC) 10 MG tablet Take 10 mg by mouth daily.    . Cholecalciferol (HM VITAMIN D3) 100 MCG (4000 UT) CAPS Take 4,000 Units by mouth daily.    . Cranberry-Cholecalciferol 4200-500  MG-UNIT CAPS Take 4,200 Units by mouth daily.    . Diclofenac Sodium (PENNSAID) 2 % SOLN Place 1 application onto the skin 2 (two) times daily. (Patient taking differently: Place 1 application onto the skin 2 (two) times daily. As needed) 1 Bottle 3  . lisinopril-hydrochlorothiazide (ZESTORETIC) 20-12.5 MG tablet Take 1 tablet by mouth daily. 30 tablet 3  . methocarbamol (ROBAXIN) 500 MG tablet Take 1 tablet (500 mg total) by mouth every 6 (six) hours as needed for muscle spasms. 60 tablet 2  . mometasone (NASONEX) 50 MCG/ACT nasal spray Place 2 sprays into the nose daily.    . Multiple Vitamins-Minerals (MULTIVITAMIN WITH MINERALS) tablet Take 1 tablet by mouth daily.    Marland Kitchen omeprazole (PRILOSEC) 40 MG capsule Take 1 capsule (40 mg total) by mouth daily. 30 capsule 3  . doxycycline (VIBRA-TABS) 100 MG tablet Take 1 tablet (100 mg total) by mouth 2 (two) times daily. 20 tablet 0  . tizanidine (ZANAFLEX) 6 MG capsule Take 1 capsule (6 mg total) by mouth at bedtime. (Patient not taking: Reported on 04/09/2020) 90 capsule 1  . varenicline (CHANTIX STARTING MONTH PAK) 0.5 MG X 11 & 1 MG X 42 tablet Take one 0.5 mg tablet by mouth once daily for 3 days, then increase to one 0.5 mg tablet twice daily for 4 days, then increase to one 1 mg tablet twice daily. 53 tablet 0   No current facility-administered medications for this visit.    Allergies: Symbicort [budesonide-formoterol fumarate], Celebrex [celecoxib], Chocolate flavor, Ciprofloxacin, Eggs or egg-derived products, Flavoring agent,  Gabapentin, Lyrica [pregabalin], Penicillins, Tramadol, Wellbutrin [bupropion], and Codeine  Past Medical History:  Diagnosis Date  . Asthma     Past Surgical History:  Procedure Laterality Date  . ABDOMINAL HYSTERECTOMY    . APPENDECTOMY    . HERNIA REPAIR     Umbilical x 3  . TONSILLECTOMY      Family History  Problem Relation Age of Onset  . Leukemia Sister   . Brain cancer Brother        glioblastoma   .  Asthma Son   . Allergies Son     Social History   Tobacco Use  . Smoking status: Current Every Day Smoker    Packs/day: 1.00    Years: 40.00    Pack years: 40.00    Types: Cigarettes    Last attempt to quit: 11/14/2017    Years since quitting: 2.4  . Smokeless tobacco: Never Used  . Tobacco comment: Restarted Sept. 2020  Substance Use Topics  . Alcohol use: No    Subjective:  Numerous concerns today:  1) bilateral ear pain/ sinus pressure; using OTC Nasonex; 2) neck/ left shoulder pain- "numbness and tingling" down into her fingertips; 3) would like to get Rx to help quit smoking- has used Chantix in the past and would like to try again 4) Chronic pain issues- unable to tolerate any medications tried previously; keeping her awake at night and feels like symptoms are worsening;   Objective:  Vitals:   04/09/20 0944  BP: 122/72  Pulse: (!) 103  Temp: 98.1 F (36.7 C)  TempSrc: Oral  SpO2: 99%  Weight: 207 lb (93.9 kg)  Height: 5' 3.75" (1.619 m)    General: Well developed, well nourished, in no acute distress  Skin : Warm and dry.  Head: Normocephalic and atraumatic  Eyes: Sclera and conjunctiva clear; pupils round and reactive to light; extraocular movements intact  Ears: External normal; canals clear; tympanic membranes normal  Oropharynx: Pink, supple. No suspicious lesions  Neck: Supple without thyromegaly, adenopathy  Lungs: Respirations unlabored; clear to auscultation bilaterally without wheeze, rales, rhonchi  CVS exam: normal rate and regular rhythm.  Abdomen: Soft; nontender; nondistended; normoactive bowel sounds; no masses or hepatosplenomegaly  Musculoskeletal: No deformities; no active joint inflammation  Extremities: No edema, cyanosis, clubbing  Vessels: Symmetric bilaterally  Neurologic: Alert and oriented; speech intact; face symmetrical; moves all extremities well; CNII-XII intact without focal deficit  Assessment:  1. Chronic pain of lower  extremity, unspecified laterality   2. Chronic neck pain   3. Acute sinusitis, recurrence not specified, unspecified location   4. Tobacco abuse     Plan:  1. Refer to pain management; 2. Update X-rays today; may need MRI of neck; 3. Rx for Doxycycline 100 mg bid x 10 days; defers oral prednisone; increase Nasonex to bid for short term; she has seen ENT but he was out of network- apparently surgery was recommended; will get copy of OV notes to determine follow-up; 4. Rx for Chantix- use as directed;  This visit occurred during the SARS-CoV-2 public health emergency.  Safety protocols were in place, including screening questions prior to the visit, additional usage of staff PPE, and extensive cleaning of exam room while observing appropriate contact time as indicated for disinfecting solutions.     No follow-ups on file.  Orders Placed This Encounter  Procedures  . DG Cervical Spine 2 or 3 views    Standing Status:   Future    Number of Occurrences:  1    Standing Expiration Date:   04/09/2021    Order Specific Question:   Reason for Exam (SYMPTOM  OR DIAGNOSIS REQUIRED)    Answer:   neck pain    Order Specific Question:   Preferred imaging location?    Answer:   [facility]    Order Specific Question:   Radiology Contrast Protocol - do NOT remove file path    Answer:   [email or web address]  . DG Shoulder Left    Standing Status:   Future    Number of Occurrences:   1    Standing Expiration Date:   04/09/2021    Order Specific Question:   Reason for Exam (SYMPTOM  OR DIAGNOSIS REQUIRED)    Answer:   shoulder pain    Order Specific Question:   Preferred imaging location?    Answer:   [facility]    Order Specific Question:   Radiology Contrast Protocol - do NOT remove file path    Answer:   [email or web address]  . Ambulatory referral to Pain Clinic    Referral  Priority:   Routine    Referral Type:   Consultation    Referral Reason:   Specialty Services Required    Requested Specialty:   Pain Medicine    Number of Visits Requested:   1    Requested Prescriptions   Signed Prescriptions Disp Refills  . doxycycline (VIBRA-TABS) 100 MG tablet 20 tablet 0    Sig: Take 1 tablet (100 mg total) by mouth 2 (two) times daily.  . varenicline (CHANTIX STARTING MONTH PAK) 0.5 MG X 11 & 1 MG X 42 tablet 53 tablet 0    Sig: Take one 0.5 mg tablet by mouth once daily for 3 days, then increase to one 0.5 mg tablet twice daily for 4 days, then increase to one 1 mg tablet twice daily.

## 2020-04-09 NOTE — Telephone Encounter (Signed)
Please let her know that it was actually her pulmonologist office that did the referral to Dr. Benjamine Mola. She needs to reach out to them since it was out of network and see if there is anything they can do to help correct the situation.   We will still get copies of the notes however from that visit.

## 2020-04-15 ENCOUNTER — Encounter: Payer: Self-pay | Admitting: Physical Medicine & Rehabilitation

## 2020-04-16 ENCOUNTER — Encounter: Payer: Self-pay | Admitting: Family

## 2020-04-16 ENCOUNTER — Other Ambulatory Visit: Payer: Self-pay | Admitting: Family

## 2020-04-16 MED ORDER — CHANTIX STARTING MONTH PAK 0.5 MG X 11 & 1 MG X 42 PO TABS: ORAL_TABLET | ORAL | 0 refills | Status: DC

## 2020-04-27 ENCOUNTER — Telehealth: Payer: Self-pay | Admitting: Family

## 2020-04-27 ENCOUNTER — Telehealth (INDEPENDENT_AMBULATORY_CARE_PROVIDER_SITE_OTHER): Payer: 59 | Admitting: Family

## 2020-04-27 DIAGNOSIS — J45909 Unspecified asthma, uncomplicated: Secondary | ICD-10-CM | POA: Diagnosis not present

## 2020-04-27 MED ORDER — BENZONATATE 200 MG PO CAPS
200.0000 mg | ORAL_CAPSULE | Freq: Three times a day (TID) | ORAL | 0 refills | Status: DC | PRN
Start: 2020-04-27 — End: 2020-05-18

## 2020-04-27 MED ORDER — AZITHROMYCIN 250 MG PO TABS: ORAL_TABLET | ORAL | 0 refills | Status: DC

## 2020-04-27 MED ORDER — PREDNISONE 20 MG PO TABS
40.0000 mg | ORAL_TABLET | Freq: Every day | ORAL | 0 refills | Status: DC
Start: 2020-04-27 — End: 2020-05-06

## 2020-04-27 NOTE — Telephone Encounter (Signed)
She would need an OV- it can be virtual; she was seen on 9/24 so this would make me concerned this is a different issue.  We may need to discuss CXR and/or COVID test.

## 2020-04-27 NOTE — Telephone Encounter (Signed)
   Patient states the doxycycline (VIBRA-TABS) 100 MG tablet is no longer working. She is coughing and wheezing. She states she is using her inhaler, but would like to be put on Prednisone as discussed in last office visit.  Patient is taking a flight tomorrow and would like medication prior to leaving.

## 2020-04-27 NOTE — Progress Notes (Signed)
Frances Nelson is a 64 y.o. female with the following history as recorded in EpicCare:  Patient Active Problem List   Diagnosis Date Noted  . GERD (gastroesophageal reflux disease) 11/06/2019  . Bilateral leg edema 10/16/2019  . Pleuritic chest pain 10/16/2019  . Hypertension 10/02/2019  . Nasal polyps 10/02/2019  . Arthralgia 06/28/2018  . Sinusitis 06/28/2018  . Renal cyst 05/03/2018  . Syncope 05/03/2018  . Stress and adjustment reaction 05/03/2018  . History of tobacco use 05/02/2018  . Primary osteoarthritis of both hands 04/10/2018  . Primary osteoarthritis of both knees 04/10/2018  . Primary osteoarthritis of both feet 04/10/2018  . Achilles tendinitis 04/02/2018  . Screening for breast cancer 03/15/2018  . Positive ANA (antinuclear antibody) 03/15/2018  . Screening for colon cancer 03/15/2018  . Rash 03/15/2018  . Achilles tendon pain 11/13/2017  . Cervical myelopathy with cervical radiculopathy ([place]) 11/13/2017  . Elevated blood pressure reading without diagnosis of hypertension 11/13/2017  . COPD with asthma ([place]) 06/19/2017  . Allergic rhinitis 06/19/2017    Current Outpatient Medications  Medication Sig Dispense Refill  . acidophilus (RISAQUAD) CAPS capsule Take 1 capsule by mouth daily.    Marland Kitchen albuterol (PROVENTIL) (2.5 MG/3ML) 0.083% nebulizer solution Take 3 mLs (2.5 mg total) by nebulization every 6 (six) hours as needed for wheezing or shortness of breath. 360 mL 5  . albuterol (VENTOLIN HFA) 108 (90 Base) MCG/ACT inhaler Inhale 2 puffs into the lungs every 6 (six) hours as needed for wheezing or shortness of breath. 8 g 5  . allopurinol (ZYLOPRIM) 100 MG tablet Take 1 tablet (100 mg total) by mouth daily. (Patient taking differently: Take 50 mg by mouth daily. ) 90 tablet 3  . azithromycin (ZITHROMAX) 250 MG tablet 2 tabs po qd x 1 day; 1 tablet per day x 4 days; 6 tablet 0  . benzonatate (TESSALON) 200 MG capsule Take 1 capsule (200 mg total) by mouth 3 (three)  times daily as needed for cough. 30 capsule 0  . cetirizine (ZYRTEC) 10 MG tablet Take 10 mg by mouth daily.    . Cholecalciferol (HM VITAMIN D3) 100 MCG (4000 UT) CAPS Take 4,000 Units by mouth daily.    . Cranberry-Cholecalciferol 4200-500 MG-UNIT CAPS Take 4,200 Units by mouth daily.    . Diclofenac Sodium (PENNSAID) 2 % SOLN Place 1 application onto the skin 2 (two) times daily. (Patient taking differently: Place 1 application onto the skin 2 (two) times daily. As needed) 1 Bottle 3  . lisinopril-hydrochlorothiazide (ZESTORETIC) 20-12.5 MG tablet Take 1 tablet by mouth daily. 30 tablet 3  . methocarbamol (ROBAXIN) 500 MG tablet Take 1 tablet (500 mg total) by mouth every 6 (six) hours as needed for muscle spasms. 60 tablet 2  . mometasone (NASONEX) 50 MCG/ACT nasal spray Place 2 sprays into the nose daily.    . Multiple Vitamins-Minerals (MULTIVITAMIN WITH MINERALS) tablet Take 1 tablet by mouth daily.    Marland Kitchen omeprazole (PRILOSEC) 40 MG capsule Take 1 capsule (40 mg total) by mouth daily. 30 capsule 3  . predniSONE (DELTASONE) 20 MG tablet Take 2 tablets (40 mg total) by mouth daily with breakfast. 10 tablet 0  . tizanidine (ZANAFLEX) 6 MG capsule Take 1 capsule (6 mg total) by mouth at bedtime. (Patient not taking: Reported on 04/09/2020) 90 capsule 1  . varenicline (CHANTIX STARTING MONTH PAK) 0.5 MG X 11 & 1 MG X 42 tablet Take one 0.5 mg tablet by mouth once daily for 3 days, then  increase to one 0.5 mg tablet twice daily for 4 days, then increase to one 1 mg tablet twice daily. 53 tablet 0   No current facility-administered medications for this visit.    Allergies: Symbicort [budesonide-formoterol fumarate], Celebrex [celecoxib], Chocolate flavor, Ciprofloxacin, Eggs or egg-derived products, Flavoring agent, Gabapentin, Lyrica [pregabalin], Penicillins, Tramadol, Wellbutrin [bupropion], and Codeine  Past Medical History:  Diagnosis Date  . Asthma     Past Surgical History:  Procedure  Laterality Date  . ABDOMINAL HYSTERECTOMY    . APPENDECTOMY    . HERNIA REPAIR     Umbilical x 3  . TONSILLECTOMY      Family History  Problem Relation Age of Onset  . Leukemia Sister   . Brain cancer Brother        glioblastoma   . Asthma Son   . Allergies Son     Social History   Tobacco Use  . Smoking status: Current Every Day Smoker    Packs/day: 1.00    Years: 40.00    Pack years: 40.00    Types: Cigarettes    Last attempt to quit: 11/14/2017    Years since quitting: 2.4  . Smokeless tobacco: Never Used  . Tobacco comment: Restarted Sept. 2020  Substance Use Topics  . Alcohol use: No    Subjective:    I connected with Frances Nelson on 04/27/20 at 11:40 AM EDT by a video enabled telemedicine application and verified that I am speaking with the correct person using two identifiers.   I discussed the limitations of evaluation and management by telemedicine and the availability of in person appointments. The patient expressed understanding and agreed to proceed. Provider in office/ patient is at home; provider and patient are only 2 people on video call.   Concerned about possible asthma flare/ bronchitis; having to use her rescue medication more frequently in the past 2-3 days; no fever, no chest pain; will be flying to [facility] tomorrow;  Had CXR in 09/2019- due for follow up with her pulmonologist in November 2021;     Objective:  There were no vitals filed for this visit.  General: Well developed, well nourished, in no acute distress Head: Normocephalic and atraumatic  Lungs: Respirations unlabored;  Neurologic: Alert and oriented; speech intact; face symmetrical;   Assessment:  1. Acute asthmatic bronchitis     Plan:  Rx for Z-pak #1 take as directed, Prednisone 40 mg qd x 5 days, Tessalon Perles 200 mg tid; increase fluids, rest; follow up worse, no better- she is planning to see her pulmonologist in November 2021.   No follow-ups on file.  No orders  of the defined types were placed in this encounter.   Requested Prescriptions   Signed Prescriptions Disp Refills  . azithromycin (ZITHROMAX) 250 MG tablet 6 tablet 0    Sig: 2 tabs po qd x 1 day; 1 tablet per day x 4 days;  . predniSONE (DELTASONE) 20 MG tablet 10 tablet 0    Sig: Take 2 tablets (40 mg total) by mouth daily with breakfast.  . benzonatate (TESSALON) 200 MG capsule 30 capsule 0    Sig: Take 1 capsule (200 mg total) by mouth 3 (three) times daily as needed for cough.

## 2020-04-28 ENCOUNTER — Other Ambulatory Visit: Payer: Self-pay | Admitting: Family

## 2020-04-28 ENCOUNTER — Encounter: Payer: Self-pay | Admitting: Family

## 2020-04-28 MED ORDER — METHOCARBAMOL 500 MG PO TABS: 500.0000 mg | ORAL_TABLET | Freq: Four times a day (QID) | ORAL | 2 refills | Status: DC | PRN

## 2020-05-06 ENCOUNTER — Other Ambulatory Visit: Payer: Self-pay

## 2020-05-06 ENCOUNTER — Encounter: Payer: 59 | Attending: Physical Medicine & Rehabilitation | Admitting: Physical Medicine & Rehabilitation

## 2020-05-06 ENCOUNTER — Encounter: Payer: Self-pay | Admitting: Physical Medicine & Rehabilitation

## 2020-05-06 VITALS — BP 103/67 | HR 86 | Temp 98.6°F | Ht 63.75 in | Wt 206.0 lb

## 2020-05-06 DIAGNOSIS — M170 Bilateral primary osteoarthritis of knee: Secondary | ICD-10-CM | POA: Diagnosis not present

## 2020-05-06 DIAGNOSIS — G5793 Unspecified mononeuropathy of bilateral lower limbs: Secondary | ICD-10-CM | POA: Diagnosis not present

## 2020-05-06 MED ORDER — NORTRIPTYLINE HCL 10 MG PO CAPS: 10.0000 mg | ORAL_CAPSULE | Freq: Every day | ORAL | 1 refills | Status: DC

## 2020-05-06 NOTE — Progress Notes (Signed)
Subjective:    Patient ID: Frances Nelson, female    DOB: Jul 28, 1955, 64 y.o.   MRN: [id number]  HPI CC:  Left knee to Left ankle pain as well as bilateral foot  64 year old female referred by her primary care physician with widespread body pain.  The patient states that the most severe pain is in the left lower extremity knee to ankle but also has significant pain in bilateral feet accompanied by numbness.  She has been evaluated by rheumatology.  She had extensive x-rays on 9/13/ 2019, bilateral knees showed mild medial compartment as well as patellofemoral osteoarthritis.  Cervical spine x-rays on 03/29/2018 showed no significant loss of disc space.  No significant cervical spondylosis noted.  Foot x-rays on the same day showed mid foot osteoarthritis as well as calcaneal spurring.  Also with osteoarthritic changes in bilateral toes.  Tried Pennsaid which was of limited benefit.  The patient was referred to sports medicine, left Achilles limited ultrasound demonstrated evidence of tendinosis with neovascularity.  The patient was sent to physical therapy. 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  Patient also was referred to hematology for abnormal SPEP, diagnosed with MGUS but no concern for multiple myeloma.  Patient saw second rheumatologist in December 2019 for a second opinion regarding blood work abnormalities.  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  Patient was tried on several medications for her pain complaints.  She has 12 medication allergies. Celebrex caused bleeding, patient tried gabapentin but recalls starting on a higher dose and felt like she had a hangover in the morning, pregabalin made the patient feel "out of it", codeine caused palpitations, tramadol does not recall reaction but this is listed as an allergy. Gabapentin tried  high dose with  Pain Inventory Average Pain 6 Pain Right Now 6 My pain is sharp, burning and stabbing  In the last 24 hours, has pain interfered with the following? General activity 7 Relation with others 0 Enjoyment of life 9 What TIME of day is your pain at its worst? morning  and night Sleep (in general) Poor  Pain is worse with: walking, bending, sitting and standing Pain improves with: medication Relief from Meds: 1  walk with assistance use a cane ability to climb steps?  yes do you drive?  yes Do you have any goals in this area?  yes  retired  bladder control problems numbness tingling trouble walking spasms dizziness depression anxiety  new  new    Family History  Problem Relation Age of Onset  . Leukemia Sister   . Brain cancer Brother        glioblastoma   . Asthma Son   . Allergies Son    Social History   Socioeconomic History  . Marital status: Divorced    Spouse name: Not on file  . Number of children: Not on file  . Years of education: Not on file  . Highest education level: Not on file  Occupational History  . Not on file  Tobacco Use  . Smoking status: Current Every Day Smoker    Packs/day: 1.00    Years: 40.00    Pack years: 40.00    Types: Cigarettes    Last attempt to quit: 11/14/2017    Years since quitting: 2.4  . Smokeless tobacco: Never Used  . Tobacco comment: Restarted Sept. 2020  Vaping Use  . Vaping Use: Never used  Substance and  Sexual Activity  . Alcohol use: No  . Drug use: Never  . Sexual activity: Not on file  Other Topics Concern  . Not on file  Social History Narrative  . Not on file   Social Determinants of Health   Financial Resource Strain:   . Difficulty of Paying Living Expenses: Not on file  Food Insecurity:   . Worried About Charity fundraiser in the Last Year: Not on file  . Ran Out of Food in the Last Year: Not on file  Transportation Needs:   . Lack of Transportation (Medical): Not on  file  . Lack of Transportation (Non-Medical): Not on file  Physical Activity:   . Days of Exercise per Week: Not on file  . Minutes of Exercise per Session: Not on file  Stress:   . Feeling of Stress : Not on file  Social Connections:   . Frequency of Communication with Friends and Family: Not on file  . Frequency of Social Gatherings with Friends and Family: Not on file  . Attends Religious Services: Not on file  . Active Member of Clubs or Organizations: Not on file  . Attends Archivist Meetings: Not on file  . Marital Status: Not on file   Past Surgical History:  Procedure Laterality Date  . ABDOMINAL HYSTERECTOMY    . APPENDECTOMY    . HERNIA REPAIR     Umbilical x 3  . TONSILLECTOMY     Past Medical History:  Diagnosis Date  . Asthma    BP 103/67   Pulse 86   Temp 98.6 F (37 C)   Ht 5' 3.75" (1.619 m)   Wt 206 lb (93.4 kg)   SpO2 94%   BMI 35.64 kg/m   Opioid Risk Score:   Fall Risk Score:  `1  Depression screen PHQ 2/9  Depression screen [facility] 2/9 05/06/2020 09/03/2019 11/13/2017  Decreased Interest 3 3 0  Down, Depressed, Hopeless 2 2 0  PHQ - 2 Score 5 5 0  Altered sleeping 3 1 -  Tired, decreased energy 3 1 -  Change in appetite 2 0 -  Feeling bad or failure about yourself  3 0 -  Trouble concentrating 2 0 -  Moving slowly or fidgety/restless 0 0 -  Suicidal thoughts 0 0 -  PHQ-9 Score 18 7 -  Difficult doing work/chores Somewhat difficult Not difficult at all -    Review of Systems  Constitutional: Negative.   HENT: Negative.   Eyes: Negative.   Respiratory: Positive for cough, shortness of breath and wheezing.   Gastrointestinal: Positive for abdominal pain.  Endocrine: Negative.   Genitourinary: Positive for difficulty urinating.  Musculoskeletal: Positive for arthralgias, back pain, myalgias, neck pain and neck stiffness.       Spasms   Skin: Positive for rash.  Allergic/Immunologic: Negative.   Neurological: Positive for  numbness.       Tingling   All other systems reviewed and are negative.      Objective:   Physical Exam Vitals and nursing note reviewed.  Constitutional:      Appearance: She is obese.  Eyes:     Extraocular Movements: Extraocular movements intact.     Conjunctiva/sclera: Conjunctivae normal.     Pupils: Pupils are equal, round, and reactive to light.  Cardiovascular:     Rate and Rhythm: Normal rate and regular rhythm.     Heart sounds: Normal heart sounds. No murmur heard.   Pulmonary:  Effort: Pulmonary effort is normal.     Breath sounds: Normal breath sounds. No wheezing.  Abdominal:     General: Abdomen is flat. Bowel sounds are normal. There is no distension.     Palpations: Abdomen is soft.     Tenderness: There is no abdominal tenderness.  Musculoskeletal:     Cervical back: Normal and normal range of motion.     Thoracic back: No deformity. Decreased range of motion.     Lumbar back: No deformity. Decreased range of motion. Negative right straight leg raise test and negative left straight leg raise test.     Right knee: No swelling, effusion, erythema or ecchymosis. Normal range of motion. Normal alignment.     Left knee: No swelling, effusion, erythema or ecchymosis. Normal range of motion. Tenderness present over the patellar tendon. Normal alignment.     Right lower leg: No swelling. No edema.     Left lower leg: No swelling. No edema.     Right ankle: No swelling.     Right Achilles Tendon: Normal.     Left ankle: Swelling present. Tenderness present over the lateral malleolus and medial malleolus.     Left Achilles Tendon: Tenderness present. No defects. Thompson's test negative.     Right foot: Normal range of motion. No Charcot foot.     Left foot: Normal range of motion. No Charcot foot.  Feet:     Right foot:     Skin integrity: Skin integrity normal.     Left foot:     Skin integrity: Skin integrity normal.     Comments: Dorsal midfoot prominence  with mild tenderness palpation Skin:    General: Skin is warm and dry.  Neurological:     Mental Status: She is alert and oriented to person, place, and time.  Psychiatric:        Mood and Affect: Mood normal.        Behavior: Behavior normal.   Mild tenderness in the upper trapezius there is no tenderness along spine area along the greater trochanters.  No tenderness along the VMO  Motor strength is 5/5 bilateral deltoid bicep tricep grip hip flexion knee extension 4/5 right ankle dorsiflexion 3 - left ankle dorsiflexion inhibited by pain. Sensation reduced bilaterally below the ankle more so on the left than on the right Positive Tinel's left medial malleoli are area      Assessment & Plan:  1.  Left lower extremity pain appears to be multifactorial.  The patient does have chronic Achilles pain with tendinosis.  In addition the patient does have mild osteoarthritis of the knee as well as chondromalacia and is more symptomatic on the left side.  In addition patient has neuropathic pain, question whether this is more of a peripheral neuropathy versus compressive neuropathy such as tarsal tunnel, would need to evaluate with EMG/NCV to further identify. From a treatment standpoint the patient has multiple drug allergies or intolerances.  She does have sleep issues as well as neuropathic pain complaints therefore we will trial low-dose nortriptyline 10 mg nightly. 2.  Midfoot pain likely due to osteoarthritis 3.  Chronic low back pain no significant findings on examination today.

## 2020-05-06 NOTE — Patient Instructions (Signed)
Your physician has ordered a Nerve Conduction Study (NCV) and/or EMG testing.  This is a test to assess the status of your nerves and muscles. For the NCV portion of the test , sticky tabs will be placed on either your hands or feet.  Your nerves will be stimulated using small electrical charges and the speed of the impulse will be measured as it travels down the nerve. For the EMG portion of the test, a small pin will be placed below the surface of the skin to measure the electrical activity in certain muscles in your arms or legs. Eating/drinking prior to testing is ok.  Please DO NOT discontinue ANY medications prior to testing  Your appointment will be scheduled by a member of our team.  You will need to check in with the main reception desk. Your test could take approximately 30 minutes to 1 hour to complete depending on the extent of the test that has been ordered. TESTING ON LEGS/BACK/HIP/FOOT AREA: Bring/wear a pair of shorts.  **ABSOLUTELY NO LOTIONS, MOISTURIZERS, VASELINE, OILS OR CREAMS OF ANY KIND ON ANY BODY PART THE DAY OF THE TEST**  **DEODORANT IS OK TO WEAR**  Please make every effort to keep your scheduled appointment time, as your physician needs the test results prior to your next appointment.  If you have to cancel or reschedule, please do so as soon as possible, so that another patient may use that testing time slot.  If you cancel your appointment, you may also need to reschedule your referring doctor's appointment until the test and results can be completed.  Please call [phone] and ask for Dr. Newton's assistant if you need to do so.  If you have any questions at any time please let us know.  We look forward to working with you!! 

## 2020-05-18 ENCOUNTER — Ambulatory Visit: Payer: 59 | Admitting: Emergency Medicine

## 2020-05-18 ENCOUNTER — Ambulatory Visit (INDEPENDENT_AMBULATORY_CARE_PROVIDER_SITE_OTHER): Payer: 59

## 2020-05-18 ENCOUNTER — Other Ambulatory Visit: Payer: Self-pay

## 2020-05-18 ENCOUNTER — Encounter: Payer: Self-pay | Admitting: Emergency Medicine

## 2020-05-18 VITALS — BP 128/70 | HR 77 | Temp 97.3°F | Ht 63.0 in | Wt 205.6 lb

## 2020-05-18 DIAGNOSIS — I10 Essential (primary) hypertension: Secondary | ICD-10-CM | POA: Diagnosis not present

## 2020-05-18 DIAGNOSIS — R768 Other specified abnormal immunological findings in serum: Secondary | ICD-10-CM

## 2020-05-18 DIAGNOSIS — J0140 Acute pansinusitis, unspecified: Secondary | ICD-10-CM

## 2020-05-18 DIAGNOSIS — J449 Chronic obstructive pulmonary disease, unspecified: Secondary | ICD-10-CM

## 2020-05-18 DIAGNOSIS — J4489 Other specified chronic obstructive pulmonary disease: Secondary | ICD-10-CM

## 2020-05-18 DIAGNOSIS — J301 Allergic rhinitis due to pollen: Secondary | ICD-10-CM

## 2020-05-18 MED ORDER — PREDNISONE 10 MG PO TABS: ORAL_TABLET | ORAL | 0 refills | Status: AC

## 2020-05-18 MED ORDER — DOXYCYCLINE HYCLATE 100 MG PO TABS
100.0000 mg | ORAL_TABLET | Freq: Two times a day (BID) | ORAL | 0 refills | Status: DC
Start: 2020-05-18 — End: 2020-10-04

## 2020-05-18 MED ORDER — BENZONATATE 200 MG PO CAPS: 200.0000 mg | ORAL_CAPSULE | Freq: Three times a day (TID) | ORAL | 0 refills | Status: DC | PRN

## 2020-05-18 NOTE — Addendum Note (Signed)
Addended by: Lia Foyer R on: 05/18/2020 11:32 AM   Modules accepted: Orders

## 2020-05-18 NOTE — Assessment & Plan Note (Signed)
As above.

## 2020-05-18 NOTE — Assessment & Plan Note (Signed)
Her albuterol use has gone up.  Not currently on schedule bronchodilator therapy.  Prednisone course and doxycycline as above.  Depending on her improvement we can consider schedule BD therapy but she has not tolerated well in the past.

## 2020-05-18 NOTE — Patient Instructions (Addendum)
Chest x-ray today Please take doxycycline 100 mg twice a day for 7 days Please take prednisone as directed until completely gone Keep your albuterol available to use either 2 puffs or 1 nebulizer treatment up to every 4 hours if needed for shortness of breath, chest tightness, wheezing. Continue Zyrtec once daily Continue Nasonex 2 sprays each nostril 1-2 times a day. Continue your lisinopril for now.  We may decide to change this going forward depending on how your coughing, shortness of breath or doing Follow with Dr Lamonte Sakai in 1 month or next available

## 2020-05-18 NOTE — Assessment & Plan Note (Signed)
ANA and p-ANCA positive serologies.  She has never had any pulmonary manifestations to our knowledge.  I will check a chest x-ray today.  She may need further imaging depending on course, chest x-ray findings.

## 2020-05-18 NOTE — Progress Notes (Addendum)
Subjective:    Patient ID: Frances Nelson, female    DOB: 07/27/1955, 64 y.o.   MRN: [id number]  COPD She complains of cough and shortness of breath. There is no wheezing. Associated symptoms include ear pain, headaches, sneezing and a sore throat. Pertinent negatives include no fever, postnasal drip, rhinorrhea or trouble swallowing. Her past medical history is significant for COPD.   ROV 05/02/18 --64 year old woman who follows up today for her COPD with a positive bronchodilator response. She quit tobacco May 11!  She has chronic cough due to these issues and also allergic rhinitis.  At her last visit we added Rhinocort to Zyrtec at least through the spring.  Is been difficult to get her on a scheduled bronchodilator/ICS due to side effects and also cost.  At her last visit we decided to hold off and just use albuterol. Since last time she has seen Rheum, positive ANA, p-ANCA positive. She had a rash but it hasn't been biopsied (resolved). She believes that her breathing is stable. Minimal cough. She has albuterol and uses about 2x a week. She had trouble recently in [place] when she went to [place] > had altitude sickness.    ROV 05/18/20 --64 year old woman with a history of COPD with Nelson asthmatic component, positive bronchodilator response, allergic rhinitis with associated chronic cough.  She is also ANA and p-ANCA positive although has never had any identifiable pulmonary manifestations, has hypertension and most recent visits here have included some medication adjustment, currently on lisinopril and adds HCTZ 12.5 mg depending on her edema.  Amlodipine was stopped due to edema.  She has Zyrtec and Nasonex. Had a significant cat exposure in late October.  She reports that she has been having nasal, sinus drainage since the exposure - starting to have dark mucous from sinuses and chest. Not on scheduled BD, her albuterol use has increased.         Review of Systems  Constitutional: Negative for  fever and unexpected weight change.  HENT: Positive for congestion, dental problem, ear pain, sneezing and sore throat. Negative for nosebleeds, postnasal drip, rhinorrhea, sinus pressure and trouble swallowing.   Eyes: Negative for redness and itching.  Respiratory: Positive for cough and shortness of breath. Negative for chest tightness and wheezing.   Cardiovascular: Negative for palpitations and leg swelling.  Gastrointestinal: Negative for nausea and vomiting.  Genitourinary: Negative for dysuria.  Musculoskeletal: Negative for joint swelling.  Skin: Negative for rash.  Neurological: Positive for headaches.  Hematological: Does not bruise/bleed easily.  Psychiatric/Behavioral: Negative for dysphoric mood. The patient is not nervous/anxious.        Objective:   Physical Exam Vitals:   05/18/20 1013  BP: 128/70  Pulse: 77  Temp: (!) 97.3 F (36.3 C)  TempSrc: Temporal  SpO2: 95%  Weight: 205 lb 9.6 oz (93.3 kg)  Height: 5\' 3"  (1.6 m)   Gen: Pleasant, overweight woman, in no distress,  normal affect  ENT: No lesions,  mouth clear,  oropharynx clear, no postnasal drip, somewhat hoarse voice  Neck: No JVD, mild exertional stridor  Lungs: No use of accessory muscles, some referred upper airway noise, soft end expiratory wheeze  Cardiovascular: RRR, heart sounds normal, no murmur or gallops, no peripheral edema  Musculoskeletal: No deformities, no cyanosis or clubbing.   Neuro: alert, non focal  Skin: Warm, no lesions or rash      Assessment & Plan:  Sinusitis Symptoms sound like a flare of her allergic rhinitis but now  possibly with some superimposed acute sinusitis.  In retrospect she has been treated with antibiotics a few times over the last 2 to 3 months, question whether the symptoms are more subacute in nature.  Continue her nasal steroid, Zyrtec.  I will treat her with doxycycline as she does not tolerate penicillins.  Short course of prednisone, principally for  her allergic and nasal sinus symptoms as opposed to bronchospasm.  COPD with asthma ([place]) Her albuterol use has gone up.  Not currently on schedule bronchodilator therapy.  Prednisone course and doxycycline as above.  Depending on her improvement we can consider schedule BD therapy but she has not tolerated well in the past.  Allergic rhinitis As above  Hypertension Suspect we will need to change her ACE inhibitor to Nelson ARB going forward.  The ACE inhibitor is going to make her upper airway disease more labile.  Positive ANA (antinuclear antibody) ANA and p-ANCA positive serologies.  She has never had any pulmonary manifestations to our knowledge.  I will check a chest x-ray today.  She may need further imaging depending on course, chest x-ray findings.  Baltazar Apo, MD, PhD 05/18/2020, 10:37 AM [facility] [phone] or if no answer [phone]

## 2020-05-18 NOTE — Assessment & Plan Note (Signed)
Suspect we will need to change her ACE inhibitor to an ARB going forward.  The ACE inhibitor is going to make her upper airway disease more labile.

## 2020-05-18 NOTE — Assessment & Plan Note (Signed)
Symptoms sound like a flare of her allergic rhinitis but now possibly with some superimposed acute sinusitis.  In retrospect she has been treated with antibiotics a few times over the last 2 to 3 months, question whether the symptoms are more subacute in nature.  Continue her nasal steroid, Zyrtec.  I will treat her with doxycycline as she does not tolerate penicillins.  Short course of prednisone, principally for her allergic and nasal sinus symptoms as opposed to bronchospasm.

## 2020-05-28 ENCOUNTER — Other Ambulatory Visit: Payer: Self-pay | Admitting: Physical Medicine & Rehabilitation

## 2020-06-03 ENCOUNTER — Encounter: Payer: Self-pay | Admitting: Physical Medicine & Rehabilitation

## 2020-06-03 ENCOUNTER — Other Ambulatory Visit: Payer: Self-pay

## 2020-06-03 ENCOUNTER — Encounter: Payer: 59 | Attending: Physical Medicine & Rehabilitation | Admitting: Physical Medicine & Rehabilitation

## 2020-06-03 DIAGNOSIS — R200 Anesthesia of skin: Secondary | ICD-10-CM | POA: Diagnosis not present

## 2020-06-03 NOTE — Progress Notes (Signed)
EMG/NCV of the right lower extremity was performed for the primary complaint of left greater than right lower extremity numbness. Please refer to the report under media tab. Plan to follow-up with patient in 3 weeks to go over test results as well as discuss medication management.

## 2020-06-03 NOTE — Patient Instructions (Signed)
Electromyoneurogram Electromyoneurogram is a test to check how well your muscles and nerves are working. This procedure includes the combined use of electromyogram (EMG) and nerve conduction study (NCS). EMG is used to look for muscular disorders. NCS, which is also called electroneurogram, measures how well your nerves are controlling your muscles. The procedures are usually done together to check if your muscles and nerves are healthy. If the results of the tests are abnormal, this may indicate disease or injury, such as a neuromuscular disease or peripheral nerve damage. Tell a health care provider about:  Any allergies you have.  All medicines you are taking, including vitamins, herbs, eye drops, creams, and over-the-counter medicines.  Any problems you or family members have had with anesthetic medicines.  Any blood disorders you have.  Any surgeries you have had.  Any medical conditions you have.  If you have a pacemaker.  Whether you are pregnant or may be pregnant. What are the risks? Generally, this is a safe procedure. However, problems may occur, including:  Infection where the electrodes were inserted.  Bleeding. What happens before the procedure? Medicines Ask your health care provider about:  Changing or stopping your regular medicines. This is especially important if you are taking diabetes medicines or blood thinners.  Taking medicines such as aspirin and ibuprofen. These medicines can thin your blood. Do not take these medicines unless your health care provider tells you to take them.  Taking over-the-counter medicines, vitamins, herbs, and supplements. General instructions  Your health care provider may ask you to avoid: ? Beverages that have caffeine, such as coffee and tea. ? Any products that contain nicotine or tobacco. These products include cigarettes, e-cigarettes, and chewing tobacco. If you need help quitting, ask your health care provider.  Do not  use lotions or creams on the same day that you will be having the procedure. What happens during the procedure? For EMG   Your health care provider will ask you to stay in a position so that he or she can access the muscle that will be studied. You may be standing, sitting, or lying down.  You may be given a medicine that numbs the area (local anesthetic).  A very thin needle that has an electrode will be inserted into your muscle.  Another small electrode will be placed on your skin near the muscle.  Your health care provider will ask you to continue to remain still.  The electrodes will send a signal that tells about the electrical activity of your muscles. You may see this on a monitor or hear it in the room.  After your muscles have been studied at rest, your health care provider will ask you to contract or flex your muscles. The electrodes will send a signal that tells about the electrical activity of your muscles.  Your health care provider will remove the electrodes and the electrode needles when the procedure is finished. The procedure may vary among health care providers and hospitals. For NCS   An electrode that records your nerve activity (recording electrode) will be placed on your skin by the muscle that is being studied.  An electrode that is used as a reference (reference electrode) will be placed near the recording electrode.  A paste or gel will be applied to your skin between the recording electrode and the reference electrode.  Your nerve will be stimulated with a mild shock. Your health care provider will measure how much time it takes for your muscle to react.  Your health care provider will remove the electrodes and the gel when the procedure is finished. The procedure may vary among health care providers and hospitals. What happens after the procedure?  It is up to you to get the results of your procedure. Ask your health care provider, or the department  that is doing the procedure, when your results will be ready.  Your health care provider may: ? Give you medicines for any pain. ? Monitor the insertion sites to make sure that bleeding stops. Summary  Electromyoneurogram is a test to check how well your muscles and nerves are working.  If the results of the tests are abnormal, this may indicate disease or injury.  This is a safe procedure. However, problems may occur, such as bleeding and infection.  Your health care provider will do two tests to complete this procedure. One checks your muscles (EMG) and another checks your nerves (NCS).  It is up to you to get the results of your procedure. Ask your health care provider, or the department that is doing the procedure, when your results will be ready. This information is not intended to replace advice given to you by your health care provider. Make sure you discuss any questions you have with your health care provider. Document Revised: 03/19/2018 Document Reviewed: 03/01/2018 Elsevier Patient Education  [place].

## 2020-06-18 ENCOUNTER — Encounter: Payer: Self-pay | Admitting: Family

## 2020-06-18 ENCOUNTER — Other Ambulatory Visit: Payer: Self-pay | Admitting: Family

## 2020-06-18 MED ORDER — LOSARTAN POTASSIUM-HCTZ 100-12.5 MG PO TABS
1.0000 | ORAL_TABLET | Freq: Every day | ORAL | 0 refills | Status: DC
Start: 2020-06-18 — End: 2020-07-12

## 2020-06-25 ENCOUNTER — Other Ambulatory Visit: Payer: Self-pay

## 2020-06-25 ENCOUNTER — Encounter: Payer: 59 | Attending: Physical Medicine & Rehabilitation | Admitting: Physical Medicine & Rehabilitation

## 2020-06-25 ENCOUNTER — Encounter: Payer: Self-pay | Admitting: Physical Medicine & Rehabilitation

## 2020-06-25 VITALS — BP 134/83 | HR 91 | Temp 98.8°F | Wt 207.0 lb

## 2020-06-25 DIAGNOSIS — M170 Bilateral primary osteoarthritis of knee: Secondary | ICD-10-CM | POA: Diagnosis not present

## 2020-06-25 DIAGNOSIS — M19072 Primary osteoarthritis, left ankle and foot: Secondary | ICD-10-CM | POA: Insufficient documentation

## 2020-06-25 DIAGNOSIS — M533 Sacrococcygeal disorders, not elsewhere classified: Secondary | ICD-10-CM | POA: Diagnosis not present

## 2020-06-25 DIAGNOSIS — M19071 Primary osteoarthritis, right ankle and foot: Secondary | ICD-10-CM | POA: Insufficient documentation

## 2020-06-25 MED ORDER — NORTRIPTYLINE HCL 10 MG PO CAPS: 20.0000 mg | ORAL_CAPSULE | Freq: Every day | ORAL | 0 refills | Status: DC

## 2020-06-25 NOTE — Progress Notes (Signed)
Subjective:    Patient ID: Frances Nelson, female    DOB: 05-Feb-1956, 64 y.o.   MRN: [id number] 64 year old female referred by her primary care physician with widespread body pain.  The patient states that the most severe pain is in the left lower extremity knee to ankle but also has significant pain in bilateral feet accompanied by numbness.  She has been evaluated by rheumatology.  She had extensive x-rays on 9/13/ 2019, bilateral knees showed mild medial compartment as well as patellofemoral osteoarthritis.  Cervical spine x-rays on 03/29/2018 showed no significant loss of disc space.  No significant cervical spondylosis noted.  Foot x-rays on the same day showed mid foot osteoarthritis as well as calcaneal spurring.  Also with osteoarthritic changes in bilateral toes.  Tried Pennsaid which was of limited benefit.  The patient was referred to sports medicine, left Achilles limited ultrasound demonstrated evidence of tendinosis with neovascularity.  The patient was sent to physical therapy. 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  Patient also was referred to hematology for abnormal SPEP, diagnosed with MGUS but no concern for multiple myeloma.  Patient saw second rheumatologist in December 2019 for a second opinion regarding blood work abnormalities.  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  Patient was tried on several medications for her pain complaints.  She has 12 medication allergies. Celebrex caused bleeding, patient tried gabapentin but recalls starting on a higher dose and felt like she had a hangover in the morning, pregabalin made the patient feel "out of it", codeine caused palpitations, tramadol caused rash, Lyrica (pregabalin) felt "out of it".  Duloxetine caused mild rash   HPI Underwent EMG/NCV LLE on 06/03/2020 and is here to  discuss results.  There is no evidence of compression neuropathy, no  electrodiagnostic evidence of lumbar radiculopathy or plexopathy.  We discussed that the EMG/NCV does not diagnose pain but rather nerve injury. Patient has tried the nortriptyline 10 mg nightly for approximately 1 month.  She has not noted any side effects from the medicine however she has not noted any benefits to her about widespread body pain.  We once again reviewed her reaction to various medications she has tried over the years.  She has 12 medication allergies. We once again reviewed that she had tried gabapentin in the past however she thinks she started on a large dose because the pills were very large and made her very tired in the morning.  Seen by pulm felt coughing may be related to lisinopril BP meds are being adjusted Pain Inventory Average Pain 4 Pain Right Now 4 My pain is constant, stabbing, tingling and aching  In the last 24 hours, has pain interfered with the following? General activity 5 Relation with others 4 Enjoyment of life 6 What TIME of day is your pain at its worst? morning , evening and night Sleep (in general) Poor  Pain is worse with: walking, bending, sitting and standing Pain improves with: medication Relief from Meds: 3  Family History  Problem Relation Age of Onset  . Leukemia Sister   . Brain cancer Brother        glioblastoma   . Asthma Son   . Allergies Son    Social History   Socioeconomic History  . Marital status: Divorced    Spouse name: Not on file  . Number of children: Not on file  . Years of education: Not on file  .  Highest education level: Not on file  Occupational History  . Not on file  Tobacco Use  . Smoking status: Current Every Day Smoker    Packs/day: 0.50    Years: 40.00    Pack years: 20.00    Types: Cigarettes  . Smokeless tobacco: Never Used  . Tobacco comment: Quit in 2019, Restarted Sept. 2020  Vaping Use  . Vaping Use: Never used   Substance and Sexual Activity  . Alcohol use: No  . Drug use: Never  . Sexual activity: Not on file  Other Topics Concern  . Not on file  Social History Narrative  . Not on file   Social Determinants of Health   Financial Resource Strain: Not on file  Food Insecurity: Not on file  Transportation Needs: Not on file  Physical Activity: Not on file  Stress: Not on file  Social Connections: Not on file   Past Surgical History:  Procedure Laterality Date  . ABDOMINAL HYSTERECTOMY    . APPENDECTOMY    . HERNIA REPAIR     Umbilical x 3  . TONSILLECTOMY     Past Surgical History:  Procedure Laterality Date  . ABDOMINAL HYSTERECTOMY    . APPENDECTOMY    . HERNIA REPAIR     Umbilical x 3  . TONSILLECTOMY     Past Medical History:  Diagnosis Date  . Asthma    BP 134/83   Pulse 91   Temp 98.8 F (37.1 C)   Wt 207 lb (93.9 kg)   SpO2 98%   BMI 36.67 kg/m   Opioid Risk Score:   Fall Risk Score:  `1  Depression screen PHQ 2/9  Depression screen [facility] 2/9 05/06/2020 09/03/2019 11/13/2017  Decreased Interest 3 3 0  Down, Depressed, Hopeless 2 2 0  PHQ - 2 Score 5 5 0  Altered sleeping 3 1 -  Tired, decreased energy 3 1 -  Change in appetite 2 0 -  Feeling bad or failure about yourself  3 0 -  Trouble concentrating 2 0 -  Moving slowly or fidgety/restless 0 0 -  Suicidal thoughts 0 0 -  PHQ-9 Score 18 7 -  Difficult doing work/chores Somewhat difficult Not difficult at all -    Review of Systems  Constitutional: Negative.   HENT: Negative.   Eyes: Negative.   Respiratory: Positive for cough.   Cardiovascular: Negative.   Gastrointestinal: Negative.   Endocrine: Negative.   Genitourinary: Negative.   Musculoskeletal: Positive for arthralgias, back pain and neck pain.  Skin: Negative.   Allergic/Immunologic: Negative.   Neurological: Negative.   Hematological: Negative.   Psychiatric/Behavioral: Negative.   All other systems reviewed and are negative.       Objective:   Physical Exam Vitals and nursing note reviewed.  Constitutional:      Appearance: She is obese.  Eyes:     Extraocular Movements: Extraocular movements intact.     Conjunctiva/sclera: Conjunctivae normal.     Pupils: Pupils are equal, round, and reactive to light.  Musculoskeletal:     Comments: There is decreased ankle flexibility on the left side.  Reduced dorsiflexion and plantarflexion.  Left knee has some patellar insertional tenderness at the quadricep tendon insertion.  No evidence of knee effusion.  Patient moves her left knee slowly but otherwise has good range of motion. Right knee without tenderness around the patellar area. Motor strength is 5/5 bilateral deltoid by stress of grip 4/5 in the hip flexors knee extensors 3 -  ankle dorsiflexion the left side due to pain 4 - on the right side. Ambulates with cane  Skin:    General: Skin is warm and dry.  Neurological:     General: No focal deficit present.     Mental Status: She is alert and oriented to person, place, and time.     Sensory: No sensory deficit.     Coordination: Coordination normal.     Gait: Gait abnormal.  Psychiatric:        Mood and Affect: Mood normal.        Behavior: Behavior normal.    No tenderness in the upper traps 1 parascapular area.  No tenderness in the lumbar spine.  There is tenderness bilateral PSIS area No evidence of joint swelling or joint deformities in the hand and wrist area bilaterally. Attempted Faber's patient states that it hurt her knees too much to perform that test.     Assessment & Plan:  #1.  Widespread body pain, the patient has osteoarthritis at multiple sites including feet knees, she may in fact have chronic sacroiliitis associated with seronegative spondyloarthropathy although testing is difficult due to other joint pains that limit SI provocative test.  She does not have the number of tender points typically associated with fibromyalgia at this time but  certainly focuses on multiple painful areas in her body. At this time I think it is best to slowly increase the nortriptyline to 20 mg/day at night.  We will slowly titrate upward to either symptom relief or to side effects.  Follow-up in 1 month if still doing well in terms of side effects but no significant relief would go up to 25 mg of nortriptyline.  We discussed that if the titration upward with nortriptyline fails, we may consider initiating low-dose gabapentin. I have printed order some chair exercises that she can start doing.  She complains of pain with weightbearing

## 2020-06-25 NOTE — Patient Instructions (Addendum)
Will slowly increase nortriptyline , up to 66m at night  Exercises To Do While Sitting  Exercises that you do while sitting (chair exercises) can give you many of the same benefits as full exercise. Benefits include strengthening your heart, burning calories, and keeping muscles and joints healthy. Exercise can also improve your mood and help with depression and anxiety. You may benefit from chair exercises if you are unable to do standing exercises because of:  Diabetic foot pain.  Obesity.  Illness.  Arthritis.  Recovery from surgery or injury.  Breathing problems.  Balance problems.  Another type of disability. Before starting chair exercises, check with your health care provider or a physical therapist to find out how much exercise you can tolerate and which exercises are safe for you. If your health care provider approves:  Start out slowly and build up over time. Aim to work up to about 10-20 minutes for each exercise session.  Make exercise part of your daily routine.  Drink water when you exercise. Do not wait until you are thirsty. Drink every 10-15 minutes.  Stop exercising right away if you have pain, nausea, shortness of breath, or dizziness.  If you are exercising in a wheelchair, make sure to lock the wheels.  Ask your health care provider whether you can do tai chi or yoga. Many positions in these mind-body exercises can be modified to do while seated. Warm-up Before starting other exercises: 1. Sit up as straight as you can. Have your knees bent at 90 degrees, which is the shape of the capital letter "L." Keep your feet flat on the floor. 2. Sit at the front edge of your chair, if you can. 3. Pull in (tighten) the muscles in your abdomen and stretch your spine and neck as straight as you can. Hold this position for a few minutes. 4. Breathe in and out evenly. Try to concentrate on your breathing, and relax your mind. Stretching Exercise A: Arm  stretch 1. Hold your arms out straight in front of your body. 2. Bend your hands at the wrist with your fingers pointing up, as if signaling someone to stop. Notice the slight tension in your forearms as you hold the position. 3. Keeping your arms out and your hands bent, rotate your hands outward as far as you can and hold this stretch. Aim to have your thumbs pointing up and your pinkie fingers pointing down. Slowly repeat arm stretches for one minute as tolerated. Exercise B: Leg stretch 1. If you can move your legs, try to "draw" letters on the floor with the toes of your foot. Write your name with one foot. 2. Write your name with the toes of your other foot. Slowly repeat the movements for one minute as tolerated. Exercise C: Reach for the sky 1. Reach your hands as far over your head as you can to stretch your spine. 2. Move your hands and arms as if you are climbing a rope. Slowly repeat the movements for one minute as tolerated. Range of motion exercises Exercise A: Shoulder roll 1. Let your arms hang loosely at your sides. 2. Lift just your shoulders up toward your ears, then let them relax back down. 3. When your shoulders feel loose, rotate your shoulders in backward and forward circles. Do shoulder rolls slowly for one minute as tolerated. Exercise B: March in place 1. As if you are marching, pump your arms and lift your legs up and down. Lift your knees as high as  you can. ? If you are unable to lift your knees, just pump your arms and move your ankles and feet up and down. March in place for one minute as tolerated. Exercise C: Seated jumping jacks 1. Let your arms hang down straight. 2. Keeping your arms straight, lift them up over your head. Aim to point your fingers to the ceiling. 3. While you lift your arms, straighten your legs and slide your heels along the floor to your sides, as wide as you can. 4. As you bring your arms back down to your sides, slide your legs back  together. ? If you are unable to use your legs, just move your arms. Slowly repeat seated jumping jacks for one minute as tolerated. Strengthening exercises Exercise A: Shoulder squeeze 1. Hold your arms straight out from your body to your sides, with your elbows bent and your fists pointed at the ceiling. 2. Keeping your arms in the bent position, move them forward so your elbows and forearms meet in front of your face. 3. Open your arms back out as wide as you can with your elbows still bent, until you feel your shoulder blades squeezing together. Hold for 5 seconds. Slowly repeat the movements forward and backward for one minute as tolerated. Contact a health care provider if you:  Had to stop exercising due to any of the following: ? Pain. ? Nausea. ? Shortness of breath. ? Dizziness. ? Fatigue.  Have significant pain or soreness after exercising. Get help right away if you have:  Chest pain.  Difficulty breathing. These symptoms may represent a serious problem that is an emergency. Do not wait to see if the symptoms will go away. Get medical help right away. Call your local emergency services (911 in the [place] Do not drive yourself to the hospital. This information is not intended to replace advice given to you by your health care provider. Make sure you discuss any questions you have with your health care provider. Document Revised: 10/24/2018 Document Reviewed: 05/16/2017 Elsevier Patient Education  2020 [employer].

## 2020-06-30 ENCOUNTER — Other Ambulatory Visit: Payer: Self-pay

## 2020-06-30 ENCOUNTER — Ambulatory Visit: Payer: 59 | Admitting: Emergency Medicine

## 2020-06-30 ENCOUNTER — Encounter: Payer: Self-pay | Admitting: Emergency Medicine

## 2020-06-30 DIAGNOSIS — K219 Gastro-esophageal reflux disease without esophagitis: Secondary | ICD-10-CM | POA: Diagnosis not present

## 2020-06-30 DIAGNOSIS — Z87891 Personal history of nicotine dependence: Secondary | ICD-10-CM | POA: Diagnosis not present

## 2020-06-30 DIAGNOSIS — R768 Other specified abnormal immunological findings in serum: Secondary | ICD-10-CM

## 2020-06-30 DIAGNOSIS — J301 Allergic rhinitis due to pollen: Secondary | ICD-10-CM

## 2020-06-30 DIAGNOSIS — I10 Essential (primary) hypertension: Secondary | ICD-10-CM

## 2020-06-30 DIAGNOSIS — J449 Chronic obstructive pulmonary disease, unspecified: Secondary | ICD-10-CM

## 2020-06-30 MED ORDER — OMEPRAZOLE 40 MG PO CPDR: 40.0000 mg | DELAYED_RELEASE_CAPSULE | Freq: Every day | ORAL | 3 refills | Status: DC

## 2020-06-30 NOTE — Assessment & Plan Note (Signed)
Continue Zyrtec and your nasal steroid spray as you have been using them.

## 2020-06-30 NOTE — Assessment & Plan Note (Signed)
With mild stable interstitial prominence on chest x-ray.  No clear indication for CT chest right now.  We will follow chest x-rays intermittently and if any evidence for progressive interstitial changes then a CT would be indicated.

## 2020-06-30 NOTE — Patient Instructions (Signed)
Your chest x-ray from last visit was stable.  We will repeat intermittently, decide whether to pursue CT scan of the chest going forward depending on overall stability. Continue your albuterol either 2 puffs or 1 nebulizer treatment as needed for shortness of breath, chest tightness, wheezing. We would not start an every day scheduled inhaler at this time. You need to work hard on decreasing your cigarettes Continue Zyrtec and your nasal steroid spray as you have been using them. We will restart your omeprazole 20 mg once daily.  Take every day 1 hour around food. COVID-19 vaccine up-to-date.  Get the booster when you are due for it. Follow with Dr Lamonte Sakai in 6 months or sooner if you have any problems

## 2020-06-30 NOTE — Assessment & Plan Note (Signed)
Discussed cessation with her today, continues to smoke 3/4 pack a day.  She is going to try and use nicotine lozenges to cut down.

## 2020-06-30 NOTE — Assessment & Plan Note (Signed)
Having some breakthrough GERD symptoms that are contributing to cough.  She ran out of her omeprazole.  We will restart this today.

## 2020-06-30 NOTE — Progress Notes (Signed)
Subjective:    Patient ID: Frances Nelson, female    DOB: 1956-02-09, 64 y.o.   MRN: [id number]  COPD She complains of cough and shortness of breath. There is no wheezing. Pertinent negatives include no ear pain, fever, headaches, postnasal drip, rhinorrhea, sneezing, sore throat or trouble swallowing. Her past medical history is significant for COPD.      ROV 05/18/20 --64 year old woman with a history of COPD with an asthmatic component, positive bronchodilator response, allergic rhinitis with associated chronic cough.  She is also ANA and p-ANCA positive although has never had any identifiable pulmonary manifestations, has hypertension and most recent visits here have included some medication adjustment, currently on lisinopril and adds HCTZ 12.5 mg depending on her edema.  Amlodipine was stopped due to edema.  She has Zyrtec and Nasonex. Had a significant cat exposure in late October.  She reports that she has been having nasal, sinus drainage since the exposure - starting to have dark mucous from sinuses and chest. Not on scheduled BD, her albuterol use has increased.   ROV 06/30/20 --follow-up visit 64 year old woman with history of COPD with an asthmatic component, allergic rhinitis, chronic cough.  She has a positive ANA and p-ANCA although no known pulmonary manifestations.  We checked a chest x-ray in 05/2020 that showed a stable interstitial prominence but no overt infiltrate.  I treated her for an acute exacerbation of allergic and also COPD symptoms in November with prednisone and doxycycline.  She was changed to losartan from ACE inhibitor about 2 weeks ago.  Today she reports that she is improved but is still having PND, cough. Also breakthrough GERD - currently out of omeprazole. . Uses [place] occasionally. She is smoking 3/4 pk/day. Skin can't tolerate patches.  Was started on nortriptyline recently by PM&R  MDM:  Reviewed PM&R notes from 06/25/2020, 06/03/2020  Review of Systems   Constitutional: Negative for fever and unexpected weight change.  HENT: Positive for congestion. Negative for dental problem, ear pain, nosebleeds, postnasal drip, rhinorrhea, sinus pressure, sneezing, sore throat and trouble swallowing.   Eyes: Negative for redness and itching.  Respiratory: Positive for cough and shortness of breath. Negative for chest tightness and wheezing.   Cardiovascular: Negative for palpitations and leg swelling.  Gastrointestinal: Negative for nausea and vomiting.  Genitourinary: Negative for dysuria.  Musculoskeletal: Negative for joint swelling.  Skin: Negative for rash.  Neurological: Negative for headaches.  Hematological: Does not bruise/bleed easily.  Psychiatric/Behavioral: Negative for dysphoric mood. The patient is not nervous/anxious.        Objective:   Physical Exam Vitals:   06/30/20 0956  BP: 124/78  Pulse: 81  Temp: (!) 97.4 F (36.3 C)  SpO2: 98%  Weight: 208 lb 6.4 oz (94.5 kg)  Height: 5\' 4"  (1.626 m)   Gen: Pleasant, overweight woman, in no distress,  normal affect  ENT: No lesions,  mouth clear,  oropharynx clear, no postnasal drip, somewhat hoarse voice  Neck: No JVD, mild exertional stridor  Lungs: No use of accessory muscles, some referred upper airway noise, soft end expiratory wheeze  Cardiovascular: RRR, heart sounds normal, no murmur or gallops, no peripheral edema  Musculoskeletal: No deformities, no cyanosis or clubbing.   Neuro: alert, non focal  Skin: Warm, no lesions or rash      Assessment & Plan:  Positive ANA (antinuclear antibody) With mild stable interstitial prominence on chest x-ray.  No clear indication for CT chest right now.  We will follow chest x-rays intermittently  and if any evidence for progressive interstitial changes then a CT would be indicated.  History of tobacco use Discussed cessation with her today, continues to smoke 3/4 pack a day.  She is going to try and use nicotine lozenges to  cut down.  GERD (gastroesophageal reflux disease) Having some breakthrough GERD symptoms that are contributing to cough.  She ran out of her omeprazole.  We will restart this today.  Allergic rhinitis Continue Zyrtec and your nasal steroid spray as you have been using them.   COPD with asthma ([place]) Improved from her acute flare.  Now quite stable, minimal albuterol use. Continue albuterol as needed.  No clear indication to start scheduled bronchodilator therapy at this time.  Most important thing for her to do is to work hard to decrease her smoking.  Discussed this with her today  Hypertension Her ACE inhibitor was changed to losartan/HCTZ, should help with her upper airway irritation and cough.  Baltazar Apo, MD, PhD 06/30/2020, 4:56 PM [facility] [phone] or if no answer [phone]

## 2020-06-30 NOTE — Assessment & Plan Note (Addendum)
Improved from her acute flare.  Now quite stable, minimal albuterol use. Continue albuterol as needed.  No clear indication to start scheduled bronchodilator therapy at this time.  Most important thing for her to do is to work hard to decrease her smoking.  Discussed this with her today

## 2020-06-30 NOTE — Assessment & Plan Note (Signed)
Her ACE inhibitor was changed to losartan/HCTZ, should help with her upper airway irritation and cough.

## 2020-07-06 ENCOUNTER — Other Ambulatory Visit: Payer: Self-pay | Admitting: Primary Care

## 2020-07-10 ENCOUNTER — Other Ambulatory Visit: Payer: Self-pay | Admitting: Family

## 2020-07-29 ENCOUNTER — Encounter: Payer: Self-pay | Admitting: Physical Medicine & Rehabilitation

## 2020-08-06 ENCOUNTER — Other Ambulatory Visit: Payer: Self-pay | Admitting: Family

## 2020-08-29 ENCOUNTER — Other Ambulatory Visit: Payer: Self-pay | Admitting: Family

## 2020-08-30 ENCOUNTER — Other Ambulatory Visit: Payer: Self-pay | Admitting: Family

## 2020-08-30 MED ORDER — HYDROCHLOROTHIAZIDE 12.5 MG PO TABS
12.5000 mg | ORAL_TABLET | Freq: Every day | ORAL | 3 refills | Status: DC
Start: 2020-08-30 — End: 2020-10-04

## 2020-08-30 NOTE — Telephone Encounter (Signed)
Please let her know that Losartan HCT is on long term backorder; will send in the individual prescriptions for Losartan and HCTZ to achieve same result;

## 2020-08-30 NOTE — Telephone Encounter (Signed)
Please tell her there is a national back order on Losartan and Losartan HCT; I have sent in alternative Diovan (valsartan) and the 160 mg of this medicine is comparable to the 100 mg of Losartan. She needs to take the HCTZ separately.

## 2020-08-31 NOTE — Telephone Encounter (Signed)
Pt notified of Laura's note & verb understanding.  States she still has quite a few Losartan left; advised to continue as prescribed & then switch when medication runs out.

## 2020-09-10 ENCOUNTER — Encounter: Payer: Self-pay | Admitting: Family

## 2020-09-19 ENCOUNTER — Other Ambulatory Visit: Payer: Self-pay | Admitting: Primary Care

## 2020-09-19 DIAGNOSIS — J449 Chronic obstructive pulmonary disease, unspecified: Secondary | ICD-10-CM

## 2020-09-20 ENCOUNTER — Other Ambulatory Visit: Payer: Self-pay | Admitting: Emergency Medicine

## 2020-10-04 ENCOUNTER — Telehealth (INDEPENDENT_AMBULATORY_CARE_PROVIDER_SITE_OTHER): Payer: 59 | Admitting: Family

## 2020-10-04 ENCOUNTER — Encounter: Payer: Self-pay | Admitting: Family

## 2020-10-04 ENCOUNTER — Other Ambulatory Visit: Payer: Self-pay

## 2020-10-04 ENCOUNTER — Other Ambulatory Visit: Payer: Self-pay | Admitting: Family

## 2020-10-04 DIAGNOSIS — J441 Chronic obstructive pulmonary disease with (acute) exacerbation: Secondary | ICD-10-CM | POA: Diagnosis not present

## 2020-10-04 MED ORDER — DOXYCYCLINE HYCLATE 100 MG PO TABS
100.0000 mg | ORAL_TABLET | Freq: Two times a day (BID) | ORAL | 0 refills | Status: DC
Start: 2020-10-04 — End: 2020-10-04

## 2020-10-04 MED ORDER — PREDNISONE 20 MG PO TABS: 40.0000 mg | ORAL_TABLET | Freq: Every day | ORAL | 0 refills | Status: DC

## 2020-10-04 MED ORDER — LOSARTAN POTASSIUM-HCTZ 100-12.5 MG PO TABS: 1.0000 | ORAL_TABLET | Freq: Every day | ORAL | 0 refills | Status: DC

## 2020-10-04 MED FILL — predniSONE 20 MG TABS: 20 | 5 days supply | Qty: 10 | Fill #0

## 2020-10-04 MED FILL — DOXYCYCLINE HYCLATE 100 MG: 100 | 10 days supply | Qty: 20 | Fill #0

## 2020-10-04 NOTE — Progress Notes (Signed)
Frances Nelson is a 65 y.o. female with the following history as recorded in EpicCare:  Patient Active Problem List   Diagnosis Date Noted  . GERD (gastroesophageal reflux disease) 11/06/2019  . Bilateral leg edema 10/16/2019  . Pleuritic chest pain 10/16/2019  . Hypertension 10/02/2019  . Nasal polyps 10/02/2019  . Arthralgia 06/28/2018  . Sinusitis 06/28/2018  . Renal cyst 05/03/2018  . Syncope 05/03/2018  . Stress and adjustment reaction 05/03/2018  . History of tobacco use 05/02/2018  . Primary osteoarthritis of both hands 04/10/2018  . Primary osteoarthritis of both knees 04/10/2018  . Primary osteoarthritis of both feet 04/10/2018  . Achilles tendinitis 04/02/2018  . Screening for breast cancer 03/15/2018  . Positive ANA (antinuclear antibody) 03/15/2018  . Screening for colon cancer 03/15/2018  . Rash 03/15/2018  . Achilles tendon pain 11/13/2017  . Cervical myelopathy with cervical radiculopathy ([place]) 11/13/2017  . Elevated blood pressure reading without diagnosis of hypertension 11/13/2017  . COPD with asthma ([place]) 06/19/2017  . Allergic rhinitis 06/19/2017    Current Outpatient Medications  Medication Sig Dispense Refill  . doxycycline (VIBRA-TABS) 100 MG tablet Take 1 tablet (100 mg total) by mouth 2 (two) times daily. 20 tablet 0  . acidophilus (RISAQUAD) CAPS capsule Take 1 capsule by mouth daily.    Marland Kitchen albuterol (PROVENTIL) (2.5 MG/3ML) 0.083% nebulizer solution USE 1 VIAL IN NEBULIZER EVERY 6HRS FOR SHORTNESS OF BREATH/WHEEZING 300 mL 5  . albuterol (VENTOLIN HFA) 108 (90 Base) MCG/ACT inhaler INHALE 2 PUFFS EVERY 6HRS AS NEEDED FOR WHEEZING/SHORTNESS OF BREATH 18 each 3  . allopurinol (ZYLOPRIM) 100 MG tablet Take 1 tablet (100 mg total) by mouth daily. (Patient taking differently: Take 50 mg by mouth daily.) 90 tablet 3  . cetirizine (ZYRTEC) 10 MG tablet Take 10 mg by mouth daily.    . Cholecalciferol (HM VITAMIN D3) 100 MCG (4000 UT) CAPS Take 4,000 Units by  mouth daily.    . methocarbamol (ROBAXIN) 500 MG tablet TAKE 1 TABLET BY MOUTH EVERY 6 HOURS AS NEEDED FOR MUSCLE SPASMS. 60 tablet 2  . mometasone (NASONEX) 50 MCG/ACT nasal spray Place 2 sprays into the nose daily.    . Multiple Vitamins-Minerals (MULTIVITAMIN WITH MINERALS) tablet Take 1 tablet by mouth daily.    . nortriptyline (PAMELOR) 10 MG capsule Take 2 capsules (20 mg total) by mouth at bedtime. 60 capsule 0  . omeprazole (PRILOSEC) 40 MG capsule TAKE 1 CAPSULE (40 MG TOTAL) BY MOUTH DAILY. 90 capsule 1  . predniSONE (DELTASONE) 20 MG tablet Take 2 tablets (40 mg total) by mouth daily with breakfast. 10 tablet 0   No current facility-administered medications for this visit.    Allergies: Symbicort [budesonide-formoterol fumarate], Tramadol, Celebrex [celecoxib], Chocolate flavor, Ciprofloxacin, Eggs or egg-derived products, Flavoring agent, Gabapentin, Lisinopril, Lyrica [pregabalin], Penicillins, Wellbutrin [bupropion], and Codeine  Past Medical History:  Diagnosis Date  . Asthma     Past Surgical History:  Procedure Laterality Date  . ABDOMINAL HYSTERECTOMY    . APPENDECTOMY    . HERNIA REPAIR     Umbilical x 3  . TONSILLECTOMY      Family History  Problem Relation Age of Onset  . Leukemia Sister   . Brain cancer Brother        glioblastoma   . Asthma Son   . Allergies Son     Social History   Tobacco Use  . Smoking status: Current Every Day Smoker    Packs/day: 0.50    Years:  40.00    Pack years: 20.00    Types: Cigarettes  . Smokeless tobacco: Never Used  . Tobacco comment: 10 cigarettes smoked daily 06/30/20 [facility]   Substance Use Topics  . Alcohol use: No    Subjective:   I connected with Frances Nelson on 10/04/20 at  3:00 PM EDT by a telephone call and verified that I am speaking with the correct person using two identifiers.   I discussed the limitations of evaluation and management by telemedicine and the availability of in person appointments. The  patient expressed understanding and agreed to proceed. Provider in office/ patient is at home; provider and patient are only 2 people on telephone call.   1 week history of cough/ congestion; history of COPD; using albuterol; typically needs antibiotics and prednisone to treat exacerbation;     Objective:  There were no vitals filed for this visit.  Lungs: Respirations unlabored;  Neurologic: Alert and oriented; speech intact;   Assessment:  1. COPD with acute exacerbation (HCC)     Plan:  Rx for Doxycycline 100 mg bid x 10 days and Prednisone 40 mg qd x 5 days; needs to see her pulmonologist if symptoms persist;  Time spent 11 minutes  No follow-ups on file.  No orders of the defined types were placed in this encounter.   Requested Prescriptions   Signed Prescriptions Disp Refills  . doxycycline (VIBRA-TABS) 100 MG tablet 20 tablet 0    Sig: Take 1 tablet (100 mg total) by mouth 2 (two) times daily.  . predniSONE (DELTASONE) 20 MG tablet 10 tablet 0    Sig: Take 2 tablets (40 mg total) by mouth daily with breakfast.

## 2020-10-20 ENCOUNTER — Telehealth (INDEPENDENT_AMBULATORY_CARE_PROVIDER_SITE_OTHER): Payer: 59 | Admitting: Family

## 2020-10-20 ENCOUNTER — Encounter: Payer: Self-pay | Admitting: Family

## 2020-10-20 DIAGNOSIS — J441 Chronic obstructive pulmonary disease with (acute) exacerbation: Secondary | ICD-10-CM | POA: Diagnosis not present

## 2020-10-20 DIAGNOSIS — R053 Chronic cough: Secondary | ICD-10-CM

## 2020-10-20 MED ORDER — DOXYCYCLINE HYCLATE 100 MG PO TABS: 100.0000 mg | ORAL_TABLET | Freq: Two times a day (BID) | ORAL | 0 refills | Status: DC

## 2020-10-20 NOTE — Progress Notes (Signed)
Frances Nelson is a 65 y.o. female with the following history as recorded in EpicCare:  Patient Active Problem List   Diagnosis Date Noted  . GERD (gastroesophageal reflux disease) 11/06/2019  . Bilateral leg edema 10/16/2019  . Pleuritic chest pain 10/16/2019  . Hypertension 10/02/2019  . Nasal polyps 10/02/2019  . Arthralgia 06/28/2018  . Sinusitis 06/28/2018  . Renal cyst 05/03/2018  . Syncope 05/03/2018  . Stress and adjustment reaction 05/03/2018  . History of tobacco use 05/02/2018  . Primary osteoarthritis of both hands 04/10/2018  . Primary osteoarthritis of both knees 04/10/2018  . Primary osteoarthritis of both feet 04/10/2018  . Achilles tendinitis 04/02/2018  . Screening for breast cancer 03/15/2018  . Positive ANA (antinuclear antibody) 03/15/2018  . Screening for colon cancer 03/15/2018  . Rash 03/15/2018  . Achilles tendon pain 11/13/2017  . Cervical myelopathy with cervical radiculopathy (Frances Nelson) 11/13/2017  . Elevated blood pressure reading without diagnosis of hypertension 11/13/2017  . COPD with asthma ([place]) 06/19/2017  . Allergic rhinitis 06/19/2017    Current Outpatient Medications  Medication Sig Dispense Refill  . doxycycline (VIBRA-TABS) 100 MG tablet Take 1 tablet (100 mg total) by mouth 2 (two) times daily. 20 tablet 0  . acidophilus (RISAQUAD) CAPS capsule Take 1 capsule by mouth daily.    Marland Kitchen albuterol (PROVENTIL) (2.5 MG/3ML) 0.083% nebulizer solution USE 1 VIAL IN NEBULIZER EVERY 6HRS FOR SHORTNESS OF BREATH/WHEEZING 300 mL 5  . albuterol (VENTOLIN HFA) 108 (90 Base) MCG/ACT inhaler INHALE 2 PUFFS EVERY 6HRS AS NEEDED FOR WHEEZING/SHORTNESS OF BREATH 18 each 3  . allopurinol (ZYLOPRIM) 100 MG tablet Take 1 tablet (100 mg total) by mouth daily. (Patient taking differently: Take 50 mg by mouth daily.) 90 tablet 3  . cetirizine (ZYRTEC) 10 MG tablet Take 10 mg by mouth daily.    . Cholecalciferol (HM VITAMIN D3) 100 MCG (4000 UT) CAPS Take 4,000 Units by  mouth daily.    Marland Kitchen doxycycline (VIBRA-TABS) 100 MG tablet TAKE 1 TABLET BY MOUTH 2 TIMES DAILY 20 tablet 0  . losartan-hydrochlorothiazide (HYZAAR) 100-12.5 MG tablet Take 1 tablet by mouth daily. 30 tablet 0  . methocarbamol (ROBAXIN) 500 MG tablet TAKE 1 TABLET BY MOUTH EVERY 6 HOURS AS NEEDED FOR MUSCLE SPASMS. 60 tablet 2  . mometasone (NASONEX) 50 MCG/ACT nasal spray Place 2 sprays into the nose daily.    . Multiple Vitamins-Minerals (MULTIVITAMIN WITH MINERALS) tablet Take 1 tablet by mouth daily.    . nortriptyline (PAMELOR) 10 MG capsule Take 2 capsules (20 mg total) by mouth at bedtime. 60 capsule 0  . omeprazole (PRILOSEC) 40 MG capsule TAKE 1 CAPSULE (40 MG TOTAL) BY MOUTH DAILY. 90 capsule 1   No current facility-administered medications for this visit.    Allergies: Symbicort [budesonide-formoterol fumarate], Tramadol, Celebrex [celecoxib], Chocolate flavor, Ciprofloxacin, Eggs or egg-derived products, Flavoring agent, Gabapentin, Lisinopril, Lyrica [pregabalin], Penicillins, Wellbutrin [bupropion], and Codeine  Past Medical History:  Diagnosis Date  . Asthma     Past Surgical History:  Procedure Laterality Date  . ABDOMINAL HYSTERECTOMY    . APPENDECTOMY    . HERNIA REPAIR     Umbilical x 3  . TONSILLECTOMY      Family History  Problem Relation Age of Onset  . Leukemia Sister   . Brain cancer Brother        glioblastoma   . Asthma Son   . Allergies Son     Social History   Tobacco Use  . Smoking status:  Current Every Day Smoker    Packs/day: 0.50    Years: 40.00    Pack years: 20.00    Types: Cigarettes  . Smokeless tobacco: Never Used  . Tobacco comment: 10 cigarettes smoked daily 06/30/20 [facility]   Substance Use Topics  . Alcohol use: No    Subjective:   I connected with Frances Nelson on 10/20/20 at 10:40 AM EDT by a telephone call and verified that I am speaking with the correct person using two identifiers.   I discussed the limitations of evaluation and  management by telemedicine and the availability of in person appointments. The patient expressed understanding and agreed to proceed. Provider in office/ patient is at home; provider and patient are only 2 people on telephone call.   Patient had virtual visit on 3/22 for COPD exacerbation. She was asked to see her pulmonologist if symptoms persisted. She finished her Doxycycline on 3/30 and has continued to struggle symptoms; did not call pulmonology as instructed- opted to do another virtual visit with me instead; is concerned about pneumonia- requesting CXR; notes that she actually felt like the prednisone given at the end of March did not help/ made symptoms worse.  Only able to do her nebulizer 2 x per day due to logistics of having to carry machine up and down stairs; using her albuterol in the interim; due to numerous allergies, has not been able to tolerate a daily preventive medication for her COPD;     Objective:  There were no vitals filed for this visit.  Lungs: Respirations unlabored; speaks in full/ intact sentences; voice is strong on phone call Neurologic: Alert and oriented; speech intact;  Assessment:  1. Chronic cough   2. COPD with acute exacerbation ([place])     Plan:  Stressed again the need to see her pulmonologist in person; in the interim, she will get CXR and d-dimer checked; will extend Rx for Doxycycline 100 mg bid x 7 more days; increase fluids, rest and follow up as needed after seeing pulmonology;  Time spent 12 minutes  No follow-ups on file.  Orders Placed This Encounter  Procedures  . DG Chest 2 View    Standing Status:   Future    Standing Expiration Date:   10/20/2021    Order Specific Question:   Reason for Exam (SYMPTOM  OR DIAGNOSIS REQUIRED)    Answer:   chronic cough    Order Specific Question:   Preferred imaging location?    Answer:   [facility]  . CBC with Differential/Platelet    Standing Status:   Future    Standing Expiration Date:    10/20/2021  . D-Dimer, Quantitative    Standing Status:   Future    Standing Expiration Date:   10/20/2021    Requested Prescriptions   Signed Prescriptions Disp Refills  . doxycycline (VIBRA-TABS) 100 MG tablet 20 tablet 0    Sig: Take 1 tablet (100 mg total) by mouth 2 (two) times daily.

## 2020-10-22 ENCOUNTER — Ambulatory Visit: Payer: 59 | Admitting: Primary Care

## 2020-10-22 ENCOUNTER — Encounter: Payer: Self-pay | Admitting: Primary Care

## 2020-10-22 ENCOUNTER — Ambulatory Visit (INDEPENDENT_AMBULATORY_CARE_PROVIDER_SITE_OTHER): Payer: 59

## 2020-10-22 ENCOUNTER — Other Ambulatory Visit: Payer: Self-pay

## 2020-10-22 VITALS — BP 126/80 | HR 99 | Temp 97.3°F | Ht 63.0 in | Wt 212.0 lb

## 2020-10-22 DIAGNOSIS — J449 Chronic obstructive pulmonary disease, unspecified: Secondary | ICD-10-CM | POA: Diagnosis not present

## 2020-10-22 DIAGNOSIS — J441 Chronic obstructive pulmonary disease with (acute) exacerbation: Secondary | ICD-10-CM

## 2020-10-22 DIAGNOSIS — J45901 Unspecified asthma with (acute) exacerbation: Secondary | ICD-10-CM

## 2020-10-22 DIAGNOSIS — R053 Chronic cough: Secondary | ICD-10-CM

## 2020-10-22 DIAGNOSIS — J4489 Other specified chronic obstructive pulmonary disease: Secondary | ICD-10-CM

## 2020-10-22 LAB — D-DIMER, QUANTITATIVE: D-Dimer, Quant: 0.3 mcg/mL FEU (ref <0.50)

## 2020-10-22 LAB — CBC WITH DIFFERENTIAL/PLATELET
Basophils Absolute: 0.1 10*3/uL (ref 0.0–0.1)
Basophils Relative: 0.9 % (ref 0.0–3.0)
Eosinophils Absolute: 0.2 10*3/uL (ref 0.0–0.7)
Eosinophils Relative: 2 % (ref 0.0–5.0)
HCT: 44.1 % (ref 36.0–46.0)
Hemoglobin: 15 g/dL (ref 12.0–15.0)
Lymphocytes Relative: 24.8 % (ref 12.0–46.0)
Lymphs Abs: 2 10*3/uL (ref 0.7–4.0)
MCHC: 33.9 g/dL (ref 30.0–36.0)
MCV: 90.2 fl (ref 78.0–100.0)
Monocytes Absolute: 0.5 10*3/uL (ref 0.1–1.0)
Monocytes Relative: 6.9 % (ref 3.0–12.0)
Neutro Abs: 5.2 10*3/uL (ref 1.4–7.7)
Neutrophils Relative %: 65.4 % (ref 43.0–77.0)
Platelets: 333 10*3/uL (ref 150.0–400.0)
RBC: 4.89 Mil/uL (ref 3.87–5.11)
RDW: 13.4 % (ref 11.5–15.5)
WBC: 7.9 10*3/uL (ref 4.0–10.5)

## 2020-10-22 MED ORDER — ADVAIR HFA 230-21 MCG/ACT IN AERO: 2.0000 | INHALATION_SPRAY | Freq: Two times a day (BID) | RESPIRATORY_TRACT | 3 refills | Status: DC

## 2020-10-22 MED ORDER — AZITHROMYCIN 250 MG PO TABS: ORAL_TABLET | ORAL | 0 refills | Status: DC

## 2020-10-22 NOTE — Patient Instructions (Addendum)
Recommendations: - Start Advair - take two puffs morning and evening (rinse mouth after use) - Continue to use Albuterol inhaler/nebulizer every 4-6 hours as needed for breakthrough shortness of breath/wheezing  - We may consider adding Singulair at some point down the line   Orders: - CXR and labs today (labs ordered by PCP) - Needs new nebulizer machine (priority)  Follow-up: - Televisit in 2 weeks with Eustaquio Maize NP

## 2020-10-22 NOTE — Progress Notes (Signed)
Please let patient know CXR showed clear lungs. I will send in augmentin for sinobronchitis. Continue to nasal spray, may want to consider using netti pot or ocean nasal spray twice a day as well.

## 2020-10-22 NOTE — Progress Notes (Signed)
@Patient  ID: Frances Nelson, female    DOB: 01-18-1956, 65 y.o.   MRN: [id number]  Chief Complaint  Patient presents with  . Follow-up    Sob, Productive cough with yellow mucus, wheezing worse since March 21.     Referring provider: Marrian Salvage,*  HPI: 65 year old female, current everyday smoker (20-pack-year history).  Past medical history significant for COPD with asthma, allergic rhinitis, hypertension, GERD, nasal polyps, sinusitis, elevated ANA.  Patient of Dr. Lamonte Sakai, last seen in office on 06/30/2020.   Previous [facility] pulmonary encounter:  ROV 05/18/20 --65 year old woman with a history of COPD with an asthmatic component, positive bronchodilator response, allergic rhinitis with associated chronic cough.  She is also ANA and p-ANCA positive although has never had any identifiable pulmonary manifestations, has hypertension and most recent visits here have included some medication adjustment, currently on lisinopril and adds HCTZ 12.5 mg depending on her edema.  Amlodipine was stopped due to edema.  She has Zyrtec and Nasonex. Had a significant cat exposure in late October.  She reports that she has been having nasal, sinus drainage since the exposure - starting to have dark mucous from sinuses and chest. Not on scheduled BD, her albuterol use has increased.   ROV 06/30/20 --follow-up visit 65 year old woman with history of COPD with an asthmatic component, allergic rhinitis, chronic cough.  She has a positive ANA and p-ANCA although no known pulmonary manifestations.  We checked a chest x-ray in 05/2020 that showed a stable interstitial prominence but no overt infiltrate.  I treated her for an acute exacerbation of allergic and also COPD symptoms in November with prednisone and doxycycline.  She was changed to losartan from ACE inhibitor about 2 weeks ago.  Today she reports that she is improved but is still having PND, cough. Also breakthrough GERD - currently out of omeprazole. .  Uses [place] occasionally. She is smoking 3/4 pk/day. Skin can't tolerate patches.  Was started on nortriptyline recently by PM&R  10/22/2020- Interim hx  Patient presents today for an acute office visit with complaints of ongoing sinusitis and upper respiratory tract infection.  She finished round of doxycycline and prednisone on 10/13/2020 prescribed by her PCP. She did improve initial after completing abx but symptoms shortly returned. Symptoms start out as sinus symptoms which then move down into her chest. She has some associated mid back pain, described as tightness. She does have a bone spur. She is regularly taking zytec and nasonex. She is not currentl on a maintenance inhaler. She previously did not tolerate breo and Symbicort. The only one that was tolerable was advair but it didn't seem to help. She has albuterol inhaler that she has been using 3-4 times a day. She is still smoking, she has tried nicotine patched which did help but causes burn marks to her skin. CAT score 29   Allergies  Allergen Reactions  . Symbicort [Budesonide-Formoterol Fumarate] Other (See Comments)    Patient reported dizziness, nausea, headaches and sore throat while using Symbicort 160. Reported on 09/03/17  . Tramadol Hives  . Celebrex [Celecoxib]     Bleeding.    . Chocolate Flavor   . Ciprofloxacin Nausea And Vomiting    Headache, shaking.   . Eggs Or Egg-Derived Products   . Flavoring Agent     Unknown  . Gabapentin     Pt started on a high dose, felt like she had a hangover  . Lisinopril Cough    Pt off med for a week,  some improvement  . Lyrica [Pregabalin]     "Made me out of it."   . Penicillins     Patient preference  . Wellbutrin [Bupropion]   . Codeine Palpitations    Immunization History  Administered Date(s) Administered  . PFIZER(Purple Top)SARS-COV-2 Vaccination 11/06/2019, 12/01/2019  . Tdap 03/15/2018    Past Medical History:  Diagnosis Date  . Asthma     Tobacco  History: Social History   Tobacco Use  Smoking Status Current Every Day Smoker  . Packs/day: 0.50  . Years: 40.00  . Pack years: 20.00  . Types: Cigarettes  Smokeless Tobacco Never Used  Tobacco Comment   1 pack  cigarettes smoked daily    Ready to quit: Not Answered Counseling given: Not Answered Comment: 1 pack  cigarettes smoked daily    Outpatient Medications Prior to Visit  Medication Sig Dispense Refill  . acidophilus (RISAQUAD) CAPS capsule Take 1 capsule by mouth daily.    Marland Kitchen albuterol (PROVENTIL) (2.5 MG/3ML) 0.083% nebulizer solution USE 1 VIAL IN NEBULIZER EVERY 6HRS FOR SHORTNESS OF BREATH/WHEEZING 300 mL 5  . albuterol (VENTOLIN HFA) 108 (90 Base) MCG/ACT inhaler INHALE 2 PUFFS EVERY 6HRS AS NEEDED FOR WHEEZING/SHORTNESS OF BREATH 18 each 3  . allopurinol (ZYLOPRIM) 100 MG tablet Take 1 tablet (100 mg total) by mouth daily. (Patient taking differently: Take 50 mg by mouth daily.) 90 tablet 3  . cetirizine (ZYRTEC) 10 MG tablet Take 10 mg by mouth daily.    . Cholecalciferol (HM VITAMIN D3) 100 MCG (4000 UT) CAPS Take 4,000 Units by mouth daily.    Marland Kitchen losartan-hydrochlorothiazide (HYZAAR) 100-12.5 MG tablet Take 1 tablet by mouth daily. 30 tablet 0  . methocarbamol (ROBAXIN) 500 MG tablet TAKE 1 TABLET BY MOUTH EVERY 6 HOURS AS NEEDED FOR MUSCLE SPASMS. 60 tablet 2  . mometasone (NASONEX) 50 MCG/ACT nasal spray Place 2 sprays into the nose daily.    . Multiple Vitamins-Minerals (MULTIVITAMIN WITH MINERALS) tablet Take 1 tablet by mouth daily.    . nortriptyline (PAMELOR) 10 MG capsule Take 2 capsules (20 mg total) by mouth at bedtime. 60 capsule 0  . omeprazole (PRILOSEC) 40 MG capsule TAKE 1 CAPSULE (40 MG TOTAL) BY MOUTH DAILY. 90 capsule 1  . doxycycline (VIBRA-TABS) 100 MG tablet TAKE 1 TABLET BY MOUTH 2 TIMES DAILY 20 tablet 0  . doxycycline (VIBRA-TABS) 100 MG tablet Take 1 tablet (100 mg total) by mouth 2 (two) times daily. (Patient not taking: Reported on 10/22/2020)  20 tablet 0   No facility-administered medications prior to visit.    Review of Systems  Review of Systems  Constitutional: Negative.   HENT: Positive for congestion and sinus pressure.   Respiratory: Positive for cough and shortness of breath.     Physical Exam  BP 126/80 (BP Location: Right Arm, Cuff Size: Normal)   Pulse 99   Temp (!) 97.3 F (36.3 C)   Ht 5\' 3"  (1.6 m)   Wt 212 lb (96.2 kg)   SpO2 95%   BMI 37.55 kg/m  Physical Exam Constitutional:      Appearance: Normal appearance.  HENT:     Right Ear: Tympanic membrane and external ear normal. There is no impacted cerumen.     Left Ear: Tympanic membrane and external ear normal. There is no impacted cerumen.     Mouth/Throat:     Mouth: Mucous membranes are moist.     Pharynx: Oropharynx is clear.  Cardiovascular:     Rate  and Rhythm: Normal rate and regular rhythm.  Pulmonary:     Breath sounds: No wheezing.     Comments: Diminished left base Musculoskeletal:        General: Normal range of motion.  Skin:    General: Skin is warm and dry.  Neurological:     Mental Status: She is alert.  Psychiatric:        Mood and Affect: Mood normal.        Behavior: Behavior normal.        Thought Content: Thought content normal.        Judgment: Judgment normal.      Lab Results:  CBC    Component Value Date/Time   WBC 7.9 10/22/2020 1115   RBC 4.89 10/22/2020 1115   HGB 15.0 10/22/2020 1115   HCT 44.1 10/22/2020 1115   PLT 333.0 10/22/2020 1115   MCV 90.2 10/22/2020 1115   MCH 29.5 07/04/2018 1119   MCHC 33.9 10/22/2020 1115   RDW 13.4 10/22/2020 1115   LYMPHSABS 2.0 10/22/2020 1115   MONOABS 0.5 10/22/2020 1115   EOSABS 0.2 10/22/2020 1115   BASOSABS 0.1 10/22/2020 1115    BMET    Component Value Date/Time   NA 135 12/26/2019 1339   K 4.2 12/26/2019 1339   CL 99 12/26/2019 1339   CO2 30 12/26/2019 1339   GLUCOSE 105 (H) 12/26/2019 1339   BUN 18 12/26/2019 1339   CREATININE 0.75 12/26/2019  1339   CREATININE 0.79 07/04/2018 1119   CALCIUM 9.8 12/26/2019 1339   GFRNONAA >60 07/04/2018 1119   GFRAA >60 07/04/2018 1119    BNP No results found for: BNP  ProBNP    Component Value Date/Time   PROBNP 20.0 10/16/2019 1116    Imaging: DG Chest 2 View  Result Date: 10/22/2020 CLINICAL DATA:  Cough. EXAM: CHEST - 2 VIEW COMPARISON:  May 18, 2020. FINDINGS: The heart size and mediastinal contours are within normal limits. Both lungs are clear. The visualized skeletal structures are unremarkable. IMPRESSION: No active cardiopulmonary disease. Aortic Atherosclerosis (ICD10-I70.0). Electronically Signed   By: Marijo Conception M.D.   On: 10/22/2020 11:35     Assessment & Plan:   COPD with asthma ([place]) - Prolonged exacerbation. Increased sob, prod cough and wheezing x 2-3 weeks. CAT score 29. Partial improvement with Doxycycline and prednisone but symptoms returned shortly after finishing. Not currently on maintenance inhaler. Recommend starting Advair HFA 230-23mcg. Sending in RX for azithromycin for bronchitis symptoms. Continue Zyrtec and nasonex, advised she add nasal sine rinses. Checking CXR and sputum culture. She needs new nebulizer machine. If advair doesn't not work or she has side effects would try adding singulair at that point. FU in 2 weeks televisit to assess response to inhaler.      Martyn Ehrich, NP 10/22/2020

## 2020-10-22 NOTE — Assessment & Plan Note (Addendum)
-   Prolonged exacerbation. Increased sob, prod cough and wheezing x 2-3 weeks. CAT score 29. Partial improvement with Doxycycline and prednisone but symptoms returned shortly after finishing. Not currently on maintenance inhaler. Recommend starting Advair HFA 230-43mcg. Sending in RX for azithromycin for bronchitis symptoms. Continue Zyrtec and nasonex, advised she add nasal sine rinses. Checking CXR and sputum culture. She needs new nebulizer machine. If advair doesn't not work or she has side effects would try adding singulair at that point. FU in 2 weeks televisit to assess response to inhaler.

## 2020-10-31 ENCOUNTER — Other Ambulatory Visit: Payer: Self-pay | Admitting: Emergency Medicine

## 2020-11-05 ENCOUNTER — Ambulatory Visit (INDEPENDENT_AMBULATORY_CARE_PROVIDER_SITE_OTHER): Payer: 59 | Admitting: Primary Care

## 2020-11-05 ENCOUNTER — Encounter: Payer: Self-pay | Admitting: Primary Care

## 2020-11-05 ENCOUNTER — Other Ambulatory Visit: Payer: Self-pay

## 2020-11-05 DIAGNOSIS — J301 Allergic rhinitis due to pollen: Secondary | ICD-10-CM

## 2020-11-05 DIAGNOSIS — J449 Chronic obstructive pulmonary disease, unspecified: Secondary | ICD-10-CM | POA: Diagnosis not present

## 2020-11-05 MED ORDER — MONTELUKAST SODIUM 10 MG PO TABS: 10.0000 mg | ORAL_TABLET | Freq: Every day | ORAL | 11 refills | Status: DC

## 2020-11-05 NOTE — Patient Instructions (Addendum)
COPD/asthma: - Continue Advair until 1 month prescription complete, let us know fi you want refill  - Use albuterol 2 puffs every 6 hours for breakthrough shortness of breath/wheezing  - Adding Montelukast (Singulair) 10mg  at bedtime  Allergic rhinitis: - Continue Nasonex nasal spray  - Continue Zyrtec 10mg  daily  - You can take chlorpheniramine OTC 4mg  every 4 hours for breakthrough allergy symptoms/cough (caution may cause drowsiness)   GERD: - Continue Omeprazole 40mg  daily  Follow-up: June with Dr. Lamonte Sakai as scheduled   Montelukast Tablets What is this medicine? MONTELUKAST (mon te LOO kast) is used to prevent and treat the symptoms of asthma. It is also used to treat allergies. Do not use for an acute asthma attack. This medicine may be used for other purposes; ask your health care provider or pharmacist if you have questions. COMMON BRAND NAME(S): Singulair What should I tell my health care provider before I take this medicine? They need to know if you have any of these conditions:  liver disease  an unusual or allergic reaction to montelukast, other medicines, foods, dyes, or preservatives  pregnant or trying to get pregnant  breast-feeding How should I use this medicine? This medicine should be given by mouth. Follow the directions on the prescription label. Take this medicine at the same time every day. You may take this medicine with or without meals. Do not chew the tablets. Do not stop taking your medicine unless your doctor tells you to. Talk to your pediatrician regarding the use of this medicine in children. Special care may be needed. While this drug may be prescribed for children as young as 47 years of age for selected conditions, precautions do apply. Overdosage: If you think you have taken too much of this medicine contact a poison control center or emergency room at once. NOTE: This medicine is only for you. Do not share this medicine with others. What if I  miss a dose? If you miss a dose, skip it. Take your next dose at the normal time. Do not take extra or 2 doses at the same time to make up for the missed dose. What may interact with this medicine?  anti-infectives like rifampin and rifabutin  medicines for seizures like phenytoin, phenobarbital, and carbamazepine This list may not describe all possible interactions. Give your health care provider a list of all the medicines, herbs, non-prescription drugs, or dietary supplements you use. Also tell them if you smoke, drink alcohol, or use illegal drugs. Some items may interact with your medicine. What should I watch for while using this medicine? Visit your doctor or health care professional for regular checks on your progress. Tell your doctor or health care professional if your allergy or asthma symptoms do not improve. Take your medicine even when you do not have symptoms. Do not stop taking any of your medicine(s) unless your doctor tells you to. If you have asthma, talk to your doctor about what to do in an acute asthma attack. Always have your inhaled rescue medicine for asthma attacks with you. Patients and their families should watch for new or worsening thoughts of suicide or depression. Also watch for sudden changes in feelings such as feeling anxious, agitated, panicky, irritable, hostile, aggressive, impulsive, severely restless, overly excited and hyperactive, or not being able to sleep. Any worsening of mood or thoughts of suicide or dying should be reported to your health care professional right away. What side effects may I notice from receiving this medicine? Side effects  that you should report to your doctor or health care professional as soon as possible:  allergic reactions like skin rash or hives, or swelling of the face, lips, or tongue  breathing problems  changes in emotions or moods  confusion  depressed mood  fever or infection  hallucinations  joint  pain  painful lumps under the skin  pain, tingling, numbness in the hands or feet  redness, blistering, peeling, or loosening of the skin, including inside the mouth  restlessness  seizures  sleep walking  signs and symptoms of infection like fever; chills; cough; sore throat; flu-like illness  signs and symptoms of liver injury like dark yellow or brown urine; general ill feeling or flu-like symptoms; light-colored stools; loss of appetite; nausea; right upper belly pain; unusually weak or tired; yellowing of the eyes or skin  sinus pain or swelling  stuttering  suicidal thoughts or other mood changes  tremors  trouble sleeping  uncontrolled muscle movements  unusual bleeding or bruising  vivid or bad dreams Side effects that usually do not require medical attention (report to your doctor or health care professional if they continue or are bothersome):  dizziness  drowsiness  headache  runny nose  stomach upset  tiredness This list may not describe all possible side effects. Call your doctor for medical advice about side effects. You may report side effects to FDA at 1-800-FDA-1088. Where should I keep my medicine? Keep out of the reach of children. Store at room temperature between 15 and 30 degrees C (59 and 86 degrees F). Protect from light and moisture. Keep this medicine in the original bottle. Throw away any unused medicine after the expiration date. NOTE: This sheet is a summary. It may not cover all possible information. If you have questions about this medicine, talk to your doctor, pharmacist, or health care provider.  2021 Elsevier/Gold Standard (2020-05-29 15:22:47)

## 2020-11-05 NOTE — Progress Notes (Signed)
Virtual Visit via Telephone Note  I connected with Frances Nelson on 11/05/20 at  9:30 AM EDT by telephone and verified that I am speaking with the correct person using two identifiers.  Location: Patient: Home Provider: Office    I discussed the limitations, risks, security and privacy concerns of performing an evaluation and management service by telephone and the availability of in person appointments. I also discussed with the patient that there may be a patient responsible charge related to this service. The patient expressed understanding and agreed to proceed.   History of Present Illness: 64 year old female, current everyday smoker (20-pack-year history).  Past medical history significant for COPD with asthma, allergic rhinitis, hypertension, GERD, nasal polyps, sinusitis, elevated ANA.  Patient of Dr. Lamonte Sakai.   Previous [facility] pulmonary encounter:  ROV 05/18/20 --65 year old woman with a history of COPD with an asthmatic component, positive bronchodilator response, allergic rhinitis with associated chronic cough.  She is also ANA and p-ANCA positive although has never had any identifiable pulmonary manifestations, has hypertension and most recent visits here have included some medication adjustment, currently on lisinopril and adds HCTZ 12.5 mg depending on her edema.  Amlodipine was stopped due to edema.  She has Zyrtec and Nasonex. Had a significant cat exposure in late October.  She reports that she has been having nasal, sinus drainage since the exposure - starting to have dark mucous from sinuses and chest. Not on scheduled BD, her albuterol use has increased.   ROV 06/30/20 --follow-up visit 65 year old woman with history of COPD with an asthmatic component, allergic rhinitis, chronic cough.  She has a positive ANA and p-ANCA although no known pulmonary manifestations.  We checked a chest x-ray in 05/2020 that showed a stable interstitial prominence but no overt infiltrate.  I treated her  for an acute exacerbation of allergic and also COPD symptoms in November with prednisone and doxycycline.  She was changed to losartan from ACE inhibitor about 2 weeks ago.  Today she reports that she is improved but is still having PND, cough. Also breakthrough GERD - currently out of omeprazole. . Uses NSW occasionally. She is smoking 3/4 pk/day. Skin can't tolerate patches.  Was started on nortriptyline recently by PM&R  10/22/2020 Patient presents today for an acute office visit with complaints of ongoing sinusitis and upper respiratory tract infection.  She finished round of doxycycline and prednisone on 10/13/2020 prescribed by her PCP. She did improve initial after completing abx but symptoms shortly returned. Symptoms start out as sinus symptoms which then move down into her chest. She has some associated mid back pain, described as tightness. She does have a bone spur. She is regularly taking zytec and nasonex. She is not currentl on a maintenance inhaler. She previously did not tolerate breo and Symbicort. The only one that was tolerable was advair but it didn't seem to help. She has albuterol inhaler that she has been using 3-4 times a day. She is still smoking, she has tried nicotine patched which did help but causes burn marks to her skin. CAT score 29  11/05/2020- Interim hx  Patient contacted today for 2-week follow-up/COPD with asthma and allergic rhinitis. During last visit she was started on Advair HFA 230-48mcg and given Zpack for prolonged copd/asthma exacerbation. CXR showed no active cardiopulmonary disease. She continues to have a cough. She is using her albuterol rescue inhaler 2-3 times a day. She hasn't noticed much difference with additional of Advair but she is still using it. It cost her  48 dollars, she is wondering if there is a cheaper alternative. She will call her insurance. She has tried both [employer] and Symbicort in the past with little results. She is compliant with Nasonex and  Zyrtec.     Observations/Objective:  - Able to speak in full sentences; no overt shortness of breath or wheezing  Assessment and Plan:  COPD with asthma: - Continues to have cough with some wheezing. Some improvement p completing Zpack. CXR showed no acute process. Cough likely being driven by PND/seasonal allergy symptoms. Advair has not helped much but she will finished 1 month prescription and notify us if she would like a refill. Use albuterol 2 puffs every 6 hours for breakthrough shortness of breath/wheezing. Adding Montelukast (Singulair) 10mg  at bedtime.   Allergic rhinitis: - Continue Nasonex nasal spray and Zyrtec 10mg   - PRN chlorpheniramine 4mg  q 4 hours for  Breakthrough allergy symptoms/cough (advised caution may cause drowsiness)   GERD: - Continue Omeprazole 40mg  daily  Positive ANA: - CXR 10/22/20 showed clear lungs, no indication for CT chest imaging at this time  Follow Up Instructions:   - 3 months or sooner if needed   I discussed the assessment and treatment plan with the patient. The patient was provided an opportunity to ask questions and all were answered. The patient agreed with the plan and demonstrated an understanding of the instructions.   The patient was advised to call back or seek an in-person evaluation if the symptoms worsen or if the condition fails to improve as anticipated.  I provided 22 minutes of non-face-to-face time during this encounter.   Martyn Ehrich, NP

## 2020-11-22 ENCOUNTER — Other Ambulatory Visit: Payer: Self-pay | Admitting: Physical Medicine & Rehabilitation

## 2020-11-28 ENCOUNTER — Other Ambulatory Visit: Payer: Self-pay | Admitting: Family

## 2020-12-06 ENCOUNTER — Other Ambulatory Visit (HOSPITAL_COMMUNITY): Payer: Self-pay

## 2020-12-06 MED FILL — Albuterol Sulfate Inhal Aero 108 MCG/ACT (90MCG Base Equiv): RESPIRATORY_TRACT | 25 days supply | Qty: 18 | Fill #0 | Status: AC

## 2020-12-06 MED FILL — Methocarbamol Tab 500 MG: ORAL | 15 days supply | Qty: 60 | Fill #0 | Status: AC

## 2020-12-10 ENCOUNTER — Other Ambulatory Visit (HOSPITAL_COMMUNITY): Payer: Self-pay

## 2020-12-30 ENCOUNTER — Encounter: Payer: Self-pay | Admitting: Emergency Medicine

## 2020-12-30 ENCOUNTER — Ambulatory Visit (INDEPENDENT_AMBULATORY_CARE_PROVIDER_SITE_OTHER): Payer: Medicare Other | Admitting: Emergency Medicine

## 2020-12-30 ENCOUNTER — Other Ambulatory Visit: Payer: Self-pay

## 2020-12-30 DIAGNOSIS — J449 Chronic obstructive pulmonary disease, unspecified: Secondary | ICD-10-CM

## 2020-12-30 DIAGNOSIS — J4489 Other specified chronic obstructive pulmonary disease: Secondary | ICD-10-CM

## 2020-12-30 DIAGNOSIS — J301 Allergic rhinitis due to pollen: Secondary | ICD-10-CM | POA: Diagnosis not present

## 2020-12-30 DIAGNOSIS — R053 Chronic cough: Secondary | ICD-10-CM | POA: Diagnosis not present

## 2020-12-30 MED ORDER — OMEPRAZOLE 40 MG PO CPDR: 40.0000 mg | DELAYED_RELEASE_CAPSULE | Freq: Two times a day (BID) | ORAL | 1 refills | Status: DC

## 2020-12-30 MED ORDER — LOSARTAN POTASSIUM 25 MG PO TABS: 25.0000 mg | ORAL_TABLET | Freq: Every day | ORAL | 3 refills | Status: DC

## 2020-12-30 NOTE — Patient Instructions (Addendum)
We will change valsartan back to losartan 25 mg once daily Continue Singulair as you have been taking it We will hold off on starting another scheduled inhaled medication for now  Keep albuterol available to use 2 puffs or 1 nebulizer treatment up to every 4 hours if needed for shortness of breath, chest tightness, wheezing.  We will increase omeprazole 40 mg twice a day.  Take this medication 1 hour around food. Follow with Dr Lamonte Sakai in 3 months or sooner if you have any problems.

## 2020-12-30 NOTE — Progress Notes (Signed)
Subjective:    Patient ID: Frances Nelson, female    DOB: 05-02-1956, 65 y.o.   MRN: [id number]  COPD She complains of cough and shortness of breath. There is no wheezing. Pertinent negatives include no ear pain, fever, headaches, postnasal drip, rhinorrhea, sneezing, sore throat or trouble swallowing. Her past medical history is significant for COPD.      ROV 06/30/20 --follow-up visit 65 year old woman with history of COPD with an asthmatic component, allergic rhinitis, chronic cough.  She has a positive ANA and p-ANCA although no known pulmonary manifestations.  We checked a chest x-ray in 05/2020 that showed a stable interstitial prominence but no overt infiltrate.  I treated her for an acute exacerbation of allergic and also COPD symptoms in November with prednisone and doxycycline.  She was changed to losartan from ACE inhibitor about 2 weeks ago.  Today she reports that she is improved but is still having PND, cough. Also breakthrough GERD - currently out of omeprazole. . Uses NSW occasionally. She is smoking 3/4 pk/day. Skin can't tolerate patches.  Was started on nortriptyline recently by PM&R   ROV 12/31/20 --65 year old woman with COPD/asthma, allergic rhinitis and positive cough.  She also has positive ANA and p-ANCA without any known pulmonary manifestations.  She still deals with chronic cough.  Reports today that she is having more cough lately, related in time to when her losartan/HCTZ to valsartan + HCTZ in April. She sometimes skips the HCTZ. She would like to get back to losartan.  She has seen ENT to look for polyps - none seen but she has a deviated septum. She uses nasal steroid and NSW. She is on omeprazole, has continued breakthrough GERD. She is coughing daily, non-productive, worst in am, does wake her from sleep.  Advair started by [facility], not covered by insurance. She is unsure whether it helps her. She did not tolerate Breo, Spiriva, Symbicort. Uses albuterol about 2x a day.   She is using singulair, ? Whether it is causing dizziness.     Review of Systems  Constitutional:  Negative for fever and unexpected weight change.  HENT:  Positive for congestion. Negative for dental problem, ear pain, nosebleeds, postnasal drip, rhinorrhea, sinus pressure, sneezing, sore throat and trouble swallowing.   Eyes:  Negative for redness and itching.  Respiratory:  Positive for cough and shortness of breath. Negative for chest tightness and wheezing.   Cardiovascular:  Negative for palpitations and leg swelling.  Gastrointestinal:  Negative for nausea and vomiting.  Genitourinary:  Negative for dysuria.  Musculoskeletal:  Negative for joint swelling.  Skin:  Negative for rash.  Neurological:  Negative for headaches.  Hematological:  Does not bruise/bleed easily.  Psychiatric/Behavioral:  Negative for dysphoric mood. The patient is not nervous/anxious.       Objective:   Physical Exam Vitals:   12/30/20 1017  BP: 124/80  Pulse: 84  Temp: 98.2 F (36.8 C)  TempSrc: Temporal  SpO2: 98%  Weight: 211 lb 6.4 oz (95.9 kg)  Height: 5\' 4"  (1.626 m)   Gen: Pleasant, overweight woman, in no distress,  normal affect  ENT: No lesions,  mouth clear,  oropharynx clear, no postnasal drip, somewhat hoarse voice  Neck: No JVD, mild exertional stridor  Lungs: No use of accessory muscles, some referred upper airway noise, soft end expiratory wheeze  Cardiovascular: RRR, heart sounds normal, no murmur or gallops, no peripheral edema  Musculoskeletal: No deformities, no cyanosis or clubbing.   Neuro: alert, non focal  Skin: Warm, no lesions or rash      Assessment & Plan:  COPD with asthma ([place]) Very difficult to get her on a stable inhaled regimen due to her formulary and also due to her inability to tolerate various BD.  On she was most recently started on Advair, again nonformulary.   We will hold off on starting another scheduled inhaled medication for now  Keep  albuterol available to use 2 puffs or 1 nebulizer treatment up to every 4 hours if needed for shortness of breath, chest tightness, wheezing.  Follow with Dr Lamonte Sakai in 3 months or sooner if you have any problems.  Allergic rhinitis Continue Singulair as you have been taking it  Chronic cough We will change valsartan back to losartan 25 mg once daily Continue Singulair as you have been taking it We will increase omeprazole 40 mg twice a day.  Take this medication 1 hour around food.  Baltazar Apo, MD, PhD 12/30/2020, 5:14 PM [facility] [phone] or if no answer [phone]

## 2020-12-30 NOTE — Assessment & Plan Note (Signed)
Very difficult to get her on a stable inhaled regimen due to her formulary and also due to her inability to tolerate various BD.  On she was most recently started on Advair, again nonformulary.   We will hold off on starting another scheduled inhaled medication for now  Keep albuterol available to use 2 puffs or 1 nebulizer treatment up to every 4 hours if needed for shortness of breath, chest tightness, wheezing.  Follow with Dr Lamonte Sakai in 3 months or sooner if you have any problems.

## 2020-12-30 NOTE — Assessment & Plan Note (Signed)
We will change valsartan back to losartan 25 mg once daily Continue Singulair as you have been taking it We will increase omeprazole 40 mg twice a day.  Take this medication 1 hour around food.

## 2020-12-30 NOTE — Assessment & Plan Note (Signed)
Continue Singulair as you have been taking it

## 2021-01-12 ENCOUNTER — Other Ambulatory Visit: Payer: Self-pay | Admitting: Primary Care

## 2021-01-12 DIAGNOSIS — J449 Chronic obstructive pulmonary disease, unspecified: Secondary | ICD-10-CM

## 2021-01-14 MED ORDER — ALBUTEROL SULFATE (2.5 MG/3ML) 0.083% IN NEBU: INHALATION_SOLUTION | RESPIRATORY_TRACT | 5 refills | Status: DC

## 2021-01-14 NOTE — Addendum Note (Signed)
Addended by: Dessie Coma on: 01/14/2021 08:51 AM   Modules accepted: Orders

## 2021-04-01 ENCOUNTER — Ambulatory Visit (INDEPENDENT_AMBULATORY_CARE_PROVIDER_SITE_OTHER): Payer: Medicare Other | Admitting: Internal Medicine

## 2021-04-01 ENCOUNTER — Encounter: Payer: Self-pay | Admitting: Internal Medicine

## 2021-04-01 ENCOUNTER — Other Ambulatory Visit: Payer: Self-pay

## 2021-04-01 VITALS — BP 136/78 | HR 88 | Temp 99.0°F | Ht 64.0 in | Wt 213.0 lb

## 2021-04-01 DIAGNOSIS — J449 Chronic obstructive pulmonary disease, unspecified: Secondary | ICD-10-CM

## 2021-04-01 DIAGNOSIS — R739 Hyperglycemia, unspecified: Secondary | ICD-10-CM | POA: Diagnosis not present

## 2021-04-01 DIAGNOSIS — I10 Essential (primary) hypertension: Secondary | ICD-10-CM

## 2021-04-01 DIAGNOSIS — G8929 Other chronic pain: Secondary | ICD-10-CM | POA: Diagnosis not present

## 2021-04-01 DIAGNOSIS — E559 Vitamin D deficiency, unspecified: Secondary | ICD-10-CM

## 2021-04-01 DIAGNOSIS — E538 Deficiency of other specified B group vitamins: Secondary | ICD-10-CM | POA: Diagnosis not present

## 2021-04-01 DIAGNOSIS — E2839 Other primary ovarian failure: Secondary | ICD-10-CM

## 2021-04-01 DIAGNOSIS — R102 Pelvic and perineal pain: Secondary | ICD-10-CM

## 2021-04-01 DIAGNOSIS — E7800 Pure hypercholesterolemia, unspecified: Secondary | ICD-10-CM

## 2021-04-01 LAB — BASIC METABOLIC PANEL
BUN: 13 mg/dL (ref 6–23)
CO2: 30 mEq/L (ref 19–32)
Calcium: 9.6 mg/dL (ref 8.4–10.5)
Chloride: 101 mEq/L (ref 96–112)
Creatinine, Ser: 0.95 mg/dL (ref 0.40–1.20)
GFR: 62.94 mL/min (ref >60.00)
Glucose, Bld: 95 mg/dL (ref 70–99)
Potassium: 3.5 mEq/L (ref 3.5–5.1)
Sodium: 138 mEq/L (ref 135–145)

## 2021-04-01 LAB — CBC WITH DIFFERENTIAL/PLATELET
Basophils Absolute: 0 10*3/uL (ref 0.0–0.1)
Basophils Relative: 0.6 % (ref 0.0–3.0)
Eosinophils Absolute: 0.1 10*3/uL (ref 0.0–0.7)
Eosinophils Relative: 1.4 % (ref 0.0–5.0)
HCT: 43.3 % (ref 36.0–46.0)
Hemoglobin: 14.5 g/dL (ref 12.0–15.0)
Lymphocytes Relative: 26 % (ref 12.0–46.0)
Lymphs Abs: 2.1 10*3/uL (ref 0.7–4.0)
MCHC: 33.5 g/dL (ref 30.0–36.0)
MCV: 92.2 fl (ref 78.0–100.0)
Monocytes Absolute: 0.6 10*3/uL (ref 0.1–1.0)
Monocytes Relative: 7.8 % (ref 3.0–12.0)
Neutro Abs: 5.2 10*3/uL (ref 1.4–7.7)
Neutrophils Relative %: 64.2 % (ref 43.0–77.0)
Platelets: 314 10*3/uL (ref 150.0–400.0)
RBC: 4.7 Mil/uL (ref 3.87–5.11)
RDW: 13.4 % (ref 11.5–15.5)
WBC: 8.1 10*3/uL (ref 4.0–10.5)

## 2021-04-01 LAB — URINALYSIS, ROUTINE W REFLEX MICROSCOPIC
Bilirubin Urine: NEGATIVE
Leukocytes,Ua: NEGATIVE
Nitrite: NEGATIVE
Specific Gravity, Urine: 1.025 (ref 1.000–1.030)
Total Protein, Urine: NEGATIVE
Urine Glucose: NEGATIVE
Urobilinogen, UA: 0.2 (ref 0.0–1.0)
pH: 5.5 (ref 5.0–8.0)

## 2021-04-01 LAB — HEPATIC FUNCTION PANEL
ALT: 31 U/L (ref 0–35)
AST: 24 U/L (ref 0–37)
Albumin: 4.1 g/dL (ref 3.5–5.2)
Alkaline Phosphatase: 79 U/L (ref 39–117)
Bilirubin, Direct: 0.1 mg/dL (ref 0.0–0.3)
Total Bilirubin: 0.5 mg/dL (ref 0.2–1.2)
Total Protein: 7.2 g/dL (ref 6.0–8.3)

## 2021-04-01 LAB — LDL CHOLESTEROL, DIRECT: Direct LDL: 141 mg/dL

## 2021-04-01 LAB — LIPID PANEL
Cholesterol: 223 mg/dL — ABNORMAL HIGH (ref 0–200)
HDL: 39.5 mg/dL (ref >39.00)
Total CHOL/HDL Ratio: 6
Triglycerides: 446 mg/dL — ABNORMAL HIGH (ref 0.0–149.0)

## 2021-04-01 LAB — VITAMIN D 25 HYDROXY (VIT D DEFICIENCY, FRACTURES): VITD: 17.98 ng/mL — ABNORMAL LOW (ref 30.00–100.00)

## 2021-04-01 LAB — TSH: TSH: 1.87 u[IU]/mL (ref 0.35–5.50)

## 2021-04-01 LAB — VITAMIN B12: Vitamin B-12: 576 pg/mL (ref 211–911)

## 2021-04-01 LAB — HEMOGLOBIN A1C: Hgb A1c MFr Bld: 6.1 % (ref 4.6–6.5)

## 2021-04-01 MED ORDER — NORTRIPTYLINE HCL 10 MG PO CAPS: ORAL_CAPSULE | ORAL | 1 refills | Status: DC

## 2021-04-01 MED ORDER — TIZANIDINE HCL 2 MG PO TABS: 2.0000 mg | ORAL_TABLET | Freq: Four times a day (QID) | ORAL | 5 refills | Status: DC | PRN

## 2021-04-01 MED ORDER — GABAPENTIN 100 MG PO CAPS: 100.0000 mg | ORAL_CAPSULE | Freq: Three times a day (TID) | ORAL | 3 refills | Status: DC

## 2021-04-01 NOTE — Progress Notes (Signed)
Patient ID: Frances Nelson, female   DOB: Nov 30, 1955, 65 y.o.   MRN: [id number]        Chief Complaint: follow up in transfer of care from NP no longer with this practice, with htn, hyperglycemia, copd, depression       HPI:  Frances Nelson is a 65 y.o. 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  Taking Nortryptlene per pain management but not seeing them now.  Needs refill.  Denies worsening depressive symptoms, suicidal ideation, or panic; but has low days, but declines further tx for now.  Pt denies chest pain, increased sob or doe, wheezing, orthopnea, PND, increased LE swelling, palpitations, dizziness or syncope.   Pt denies polydipsia, polyuria, or new focal neuro s/s.   Due for DXA, o/w up to date with preventive referrals and immunizations.   Pt denies fever, wt loss, night sweats, loss of appetite, or other constitutional symptoms   Wt Readings from Last 3 Encounters:  04/01/21 213 lb (96.6 kg)  12/30/20 211 lb 6.4 oz (95.9 kg)  10/22/20 212 lb (96.2 kg)   BP Readings from Last 3 Encounters:  04/01/21 136/78  12/30/20 124/80  10/22/20 126/80         Past Medical History:  Diagnosis Date   Asthma    Past Surgical History:  Procedure Laterality Date   ABDOMINAL HYSTERECTOMY     APPENDECTOMY     HERNIA REPAIR     Umbilical x 3   TONSILLECTOMY      reports that she has been smoking cigarettes. She has a 20.00 pack-year smoking history. She has never used smokeless tobacco. She reports that she does not drink alcohol and does not use drugs. family history includes Allergies in her son; Asthma in her son; Brain cancer in her brother; Leukemia in her sister. Allergies  Allergen Reactions   Symbicort [Budesonide-Formoterol Fumarate] Other (See Comments)    Patient reported dizziness, nausea, headaches and sore throat while using Symbicort 160. Reported on 09/03/17   Tramadol Hives   Celebrex [Celecoxib]     Bleeding.      Chocolate Flavor    Ciprofloxacin Nausea And Vomiting    Headache, shaking.    Eggs Or Egg-Derived Products    Flavoring Agent     Unknown   Lisinopril Cough    Pt off med for a week, some improvement   Lyrica [Pregabalin]     "Made me out of it."    Penicillins     Patient preference   Wellbutrin [Bupropion]    Codeine Palpitations   Current Outpatient Medications on File Prior to Visit  Medication Sig Dispense Refill   acidophilus (RISAQUAD) CAPS capsule Take 1 capsule by mouth daily.     albuterol (PROVENTIL) (2.5 MG/3ML) 0.083% nebulizer solution USE 1 VIAL IN NEBULIZER EVERY 6HRS FOR SHORTNESS OF BREATH/WHEEZING 300 mL 5   albuterol (VENTOLIN HFA) 108 (90 Base) MCG/ACT inhaler INHALE 2 PUFFS BY MOUTH EVERY 6 HOURS AS NEEDED FOR WHEEZING/SHORTNESS OF BREATH 18 g 11   cetirizine (ZYRTEC) 10 MG tablet Take 10 mg by mouth daily.     Cholecalciferol (HM VITAMIN D3) 100 MCG (4000 UT) CAPS Take 4,000 Units by mouth daily.     hydrochlorothiazide (HYDRODIURIL) 12.5 MG tablet Take 12.5 mg by mouth daily.     mometasone (NASONEX) 50 MCG/ACT nasal spray Place 2 sprays into the nose daily.     montelukast (SINGULAIR) 10  MG tablet Take 1 tablet (10 mg total) by mouth at bedtime. 30 tablet 11   Multiple Vitamins-Minerals (MULTIVITAMIN WITH MINERALS) tablet Take 1 tablet by mouth daily.     No current facility-administered medications on file prior to visit.        ROS:  All others reviewed and negative.  Objective        PE:  BP 136/78 (BP Location: Right Arm, Patient Position: Sitting, Cuff Size: Large)   Pulse 88   Temp 99 F (37.2 C) (Oral)   Ht '5\' 4"'$  (1.626 m)   Wt 213 lb (96.6 kg)   SpO2 96%   BMI 36.56 kg/m                 Constitutional: Pt appears in NAD               HENT: Head: NCAT.                Right Ear: External ear normal.                 Left Ear: External ear normal.                Eyes: . Pupils are equal, round, and reactive to light. Conjunctivae and EOM  are normal               Nose: without d/c or deformity               Neck: Neck supple. Gross normal ROM               Cardiovascular: Normal rate and regular rhythm.                 Pulmonary/Chest: Effort normal and breath sounds without rales or wheezing.                Abd:  Soft, NT, ND, + BS, no organomegaly but has a firm low abd vague "fullness" suprapubic               Neurological: Pt is alert. At baseline orientation, motor grossly intact               Skin: Skin is warm. No rashes, no other new lesions, LE edema - none               Psychiatric: Pt behavior is normal without agitation   Micro: none  Cardiac tracings I have personally interpreted today:  none  Pertinent Radiological findings (summarize): none   Lab Results  Component Value Date   WBC 8.1 04/01/2021   HGB 14.5 04/01/2021   HCT 43.3 04/01/2021   PLT 314.0 04/01/2021   GLUCOSE 95 04/01/2021   CHOL 223 (H) 04/01/2021   TRIG (H) 04/01/2021    446.0 Triglyceride is over 400; calculations on Lipids are invalid.   HDL 39.50 04/01/2021   LDLDIRECT 141.0 04/01/2021   ALT 31 04/01/2021   AST 24 04/01/2021   NA 138 04/01/2021   K 3.5 04/01/2021   CL 101 04/01/2021   CREATININE 0.95 04/01/2021   BUN 13 04/01/2021   CO2 30 04/01/2021   TSH 1.87 04/01/2021   HGBA1C 6.1 04/01/2021   Assessment/Plan:  Frances Nelson is a 65 y.o. White or Caucasian [1] female with  has a past medical history of Asthma.  Chronic pain Will need to try to avoid narcotics, not currently seeing pain management due to cost, ok for increased higher dose nortryptilene,  gabapentin, and tizandine.  to f/u any worsening symptoms or concerns  Hypertension BP Readings from Last 3 Encounters:  04/01/21 136/78  12/30/20 124/80  10/22/20 126/80   Stable, pt to continue medical treatment hct, losartan   Hyperglycemia Lab Results  Component Value Date   HGBA1C 6.1 04/01/2021   Stable, pt to continue current medical treatment  -  diet   Pelvic pain Vague, mild but with low abd "fullness" - for pelvic US r/o mass  Vitamin D deficiency Last vitamin D Lab Results  Component Value Date   VD25OH 17.98 (L) 04/01/2021   Low, to start oral replacement   HLD (hyperlipidemia) Lab Results  Component Value Date   CHOL 223 (H) 04/01/2021   HDL 39.50 04/01/2021   LDLDIRECT 141.0 04/01/2021   TRIG (H) 04/01/2021    446.0 Triglyceride is over 400; calculations on Lipids are invalid.   CHOLHDL 6 04/01/2021   Uncontrolled, goal LDL at least < 100, declines statin for now   Estrogen deficiency Also for dxa  COPD with asthma ([place]) Overall stable, cont current med tx - symbicort, albuterol  Followup: Return in about 6 months (around 09/29/2021).  Cathlean Cower, MD 04/09/2021 2:11 PM [facility]

## 2021-04-01 NOTE — Patient Instructions (Addendum)
Please schedule the bone density test before leaving today at the scheduling desk (where you check out)  Ok to stop the robaxin and allopurinol  Please take all new medication as prescribed - the higher dose nortryptilene, gabapentin, and tizandine  Please continue all other medications as before, and refills have been done if requested.  Please have the pharmacy call with any other refills you may need.  Please continue your efforts at being more active, low cholesterol diet, and weight control.  You are otherwise up to date with prevention measures today.  Please keep your appointments with your specialists as you may have planned  You will be contacted regarding the referral for: Pelvic ultrasound  Please go to the LAB at the blood drawing area for the tests to be done  You will be contacted by phone if any changes need to be made immediately.  Otherwise, you will receive a letter about your results with an explanation, but please check with MyChart first.  Please remember to sign up for MyChart if you have not done so, as this will be important to you in the future with finding out test results, communicating by private email, and scheduling acute appointments online when needed.  Please make an Appointment to return in 6 months, or sooner if needed,

## 2021-04-02 LAB — URINE CULTURE

## 2021-04-03 ENCOUNTER — Encounter: Payer: Self-pay | Admitting: Internal Medicine

## 2021-04-04 ENCOUNTER — Other Ambulatory Visit: Payer: Medicare Other

## 2021-04-04 ENCOUNTER — Ambulatory Visit
Admission: RE | Admit: 2021-04-04 | Discharge: 2021-04-04 | Disposition: A | Discharge status: Home / Self Care | Payer: Medicare Other | Source: Ambulatory Visit | Attending: Internal Medicine | Admitting: Internal Medicine

## 2021-04-04 ENCOUNTER — Other Ambulatory Visit (HOSPITAL_COMMUNITY): Payer: Self-pay

## 2021-04-04 DIAGNOSIS — R102 Pelvic and perineal pain: Secondary | ICD-10-CM

## 2021-04-04 MED ORDER — ROSUVASTATIN CALCIUM 20 MG PO TABS
20.0000 mg | ORAL_TABLET | Freq: Every day | ORAL | 0 refills | Status: DC
  Filled 2021-04-04: qty 90, 90d supply, fill #0

## 2021-04-05 ENCOUNTER — Encounter: Payer: Self-pay | Admitting: Internal Medicine

## 2021-04-08 ENCOUNTER — Other Ambulatory Visit: Payer: Self-pay | Admitting: Emergency Medicine

## 2021-04-09 ENCOUNTER — Encounter: Payer: Self-pay | Admitting: Internal Medicine

## 2021-04-09 ENCOUNTER — Other Ambulatory Visit: Payer: Self-pay | Admitting: Emergency Medicine

## 2021-04-09 DIAGNOSIS — G8929 Other chronic pain: Secondary | ICD-10-CM | POA: Insufficient documentation

## 2021-04-09 DIAGNOSIS — R102 Pelvic and perineal pain: Secondary | ICD-10-CM | POA: Insufficient documentation

## 2021-04-09 DIAGNOSIS — E785 Hyperlipidemia, unspecified: Secondary | ICD-10-CM | POA: Insufficient documentation

## 2021-04-09 DIAGNOSIS — E559 Vitamin D deficiency, unspecified: Secondary | ICD-10-CM | POA: Insufficient documentation

## 2021-04-09 DIAGNOSIS — E2839 Other primary ovarian failure: Secondary | ICD-10-CM | POA: Insufficient documentation

## 2021-04-09 NOTE — Assessment & Plan Note (Signed)
Lab Results  Component Value Date   CHOL 223 (H) 04/01/2021   HDL 39.50 04/01/2021   LDLDIRECT 141.0 04/01/2021   TRIG (H) 04/01/2021    446.0 Triglyceride is over 400; calculations on Lipids are invalid.   CHOLHDL 6 04/01/2021   Uncontrolled, goal LDL at least < 100, declines statin for now

## 2021-04-09 NOTE — Assessment & Plan Note (Signed)
BP Readings from Last 3 Encounters:  04/01/21 136/78  12/30/20 124/80  10/22/20 126/80   Stable, pt to continue medical treatment hct, losartan

## 2021-04-09 NOTE — Assessment & Plan Note (Signed)
Last vitamin D Lab Results  Component Value Date   VD25OH 17.98 (L) 04/01/2021   Low, to start oral replacement

## 2021-04-09 NOTE — Assessment & Plan Note (Signed)
Vague, mild but with low abd "fullness" - for pelvic US r/o mass

## 2021-04-09 NOTE — Assessment & Plan Note (Signed)
Also for dxa

## 2021-04-09 NOTE — Assessment & Plan Note (Signed)
Will need to try to avoid narcotics, not currently seeing pain management due to cost, ok for increased higher dose nortryptilene, gabapentin, and tizandine.  to f/u any worsening symptoms or concerns

## 2021-04-09 NOTE — Assessment & Plan Note (Signed)
Lab Results  Component Value Date   HGBA1C 6.1 04/01/2021   Stable, pt to continue current medical treatment  - diet

## 2021-04-09 NOTE — Assessment & Plan Note (Signed)
Overall stable, cont current med tx - symbicort, albuterol

## 2021-04-12 ENCOUNTER — Ambulatory Visit (INDEPENDENT_AMBULATORY_CARE_PROVIDER_SITE_OTHER): Payer: Medicare Other | Admitting: Emergency Medicine

## 2021-04-12 ENCOUNTER — Other Ambulatory Visit: Payer: Self-pay | Admitting: Emergency Medicine

## 2021-04-12 ENCOUNTER — Other Ambulatory Visit: Payer: Self-pay

## 2021-04-12 ENCOUNTER — Encounter: Payer: Self-pay | Admitting: Emergency Medicine

## 2021-04-12 DIAGNOSIS — I10 Essential (primary) hypertension: Secondary | ICD-10-CM | POA: Diagnosis not present

## 2021-04-12 DIAGNOSIS — J301 Allergic rhinitis due to pollen: Secondary | ICD-10-CM | POA: Diagnosis not present

## 2021-04-12 DIAGNOSIS — K219 Gastro-esophageal reflux disease without esophagitis: Secondary | ICD-10-CM

## 2021-04-12 DIAGNOSIS — J449 Chronic obstructive pulmonary disease, unspecified: Secondary | ICD-10-CM

## 2021-04-12 MED ORDER — SPIRIVA RESPIMAT 1.25 MCG/ACT IN AERS: 2.0000 | INHALATION_SPRAY | Freq: Every day | RESPIRATORY_TRACT | 0 refills | Status: DC

## 2021-04-12 MED ORDER — SPIRIVA RESPIMAT 1.25 MCG/ACT IN AERS: 2.0000 | INHALATION_SPRAY | Freq: Every day | RESPIRATORY_TRACT | 4 refills | Status: DC

## 2021-04-12 NOTE — Patient Instructions (Addendum)
We will try starting Spiriva Respimat 2 sprays once daily. Keep albuterol available use 2 puffs when needed shortness of breath, chest tightness, wheezing. Continue Zyrtec, Singulair, Nasonex nasal spray as you have been taking them. Continue omeprazole 40 mg twice daily. We need to work on try to decrease your cigarettes.  Try to cut down to 1 pack daily by our next visit. You would benefit from getting the new COVID-19 booster this fall. Follow with Dr Lamonte Sakai in 6 months or sooner if you have any problems

## 2021-04-12 NOTE — Assessment & Plan Note (Signed)
Tolerated going back on losartan

## 2021-04-12 NOTE — Addendum Note (Signed)
Addended by: Gavin Potters R on: 04/12/2021 10:33 AM   Modules accepted: Orders

## 2021-04-12 NOTE — Assessment & Plan Note (Signed)
Has benefited some from the increase in omeprazole to twice a day.  Plan to continue this

## 2021-04-12 NOTE — Assessment & Plan Note (Signed)
Remains somewhat active, does contribute to her flaring cough and symptoms.  Continue her Zyrtec, Nasonex, Singulair

## 2021-04-12 NOTE — Progress Notes (Signed)
Subjective:    Patient ID: Frances Nelson, female    DOB: March 09, 1956, 65 y.o.   MRN: [id number]  COPD She complains of cough and shortness of breath. There is no wheezing. Pertinent negatives include no ear pain, fever, headaches, postnasal drip, rhinorrhea, sneezing, sore throat or trouble swallowing. Her past medical history is significant for COPD.     ROV 12/31/20 --65 year old woman with COPD/asthma, allergic rhinitis and positive cough.  She also has positive ANA and p-ANCA without any known pulmonary manifestations.  She still deals with chronic cough.  Reports today that she is having more cough lately, related in time to when her losartan/HCTZ to valsartan + HCTZ in April. She sometimes skips the HCTZ. She would like to get back to losartan.  She has seen ENT to look for polyps - none seen but she has a deviated septum. She uses nasal steroid and [place]. She is on omeprazole, has continued breakthrough GERD. She is coughing daily, non-productive, worst in am, does wake her from sleep.  Advair started by BW, not covered by insurance. She is unsure whether it helps her. She did not tolerate Breo, Spiriva, Symbicort. Uses albuterol about 2x a day.  She is using singulair, ? Whether it is causing dizziness.   ROV 04/12/21 --follow-up visit for 66 year old woman with history of active tobacco use, COPD/asthma, allergic rhinitis, GERD, chronic cough.  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 It has been difficult to get her on a stable inhaled regimen due to inability to tolerate, cost and formulary issues.  She uses albuterol usually about 2x a day - helps her breathing and coughing. She can produce some mucous after using it. She believes that her cough has been worse for about 2 weeks, ? Related to addition gabapentin. She has stable but daily congestion and drainage, usually in the am.  She is on Singulair, zyrtec, nasonex, losartan.  Last visit I  increased her omeprazole to 40 mg twice daily due to breakthrough GERD - better managed, still occasional breakthrough. She smokes 1.5pk/day.     Review of Systems  Constitutional:  Negative for fever and unexpected weight change.  HENT:  Positive for congestion. Negative for dental problem, ear pain, nosebleeds, postnasal drip, rhinorrhea, sinus pressure, sneezing, sore throat and trouble swallowing.   Eyes:  Negative for redness and itching.  Respiratory:  Positive for cough and shortness of breath. Negative for chest tightness and wheezing.   Cardiovascular:  Negative for palpitations and leg swelling.  Gastrointestinal:  Negative for nausea and vomiting.  Genitourinary:  Negative for dysuria.  Musculoskeletal:  Negative for joint swelling.  Skin:  Negative for rash.  Neurological:  Negative for headaches.  Hematological:  Does not bruise/bleed easily.  Psychiatric/Behavioral:  Negative for dysphoric mood. The patient is not nervous/anxious.       Objective:   Physical Exam Vitals:   04/12/21 1007  BP: 132/80  Pulse: 77  Temp: 98.7 F (37.1 C)  TempSrc: Oral  SpO2: 95%  Weight: 211 lb 3.2 oz (95.8 kg)  Height: 5\' 4"  (1.626 m)   Gen: Pleasant, overweight woman, in no distress,  normal affect  ENT: No lesions,  mouth clear,  oropharynx clear, no postnasal drip, hoarse voice  Neck: No JVD, no stridor  Lungs: No use of accessory muscles, distant clear  Cardiovascular: RRR, heart sounds normal, no murmur or gallops, no peripheral edema  Musculoskeletal: No deformities, no cyanosis or clubbing.   Neuro: alert,  non focal  Skin: Warm, no lesions or rash      Assessment & Plan:  COPD with asthma ([place]) It has been difficult to get her on a stable inhaled regimen due to cost and also side effects.  In particular she has not tolerated the powdered formulations.  Usually cause upper airway irritation and cough flaring.  She has never tried Spiriva Respimat, only the  HandiHaler.  I think would be reasonable to try this to see if she gets benefit.  Continue albuterol as needed.  Obviously smoking cessation is needed  GERD (gastroesophageal reflux disease) Has benefited some from the increase in omeprazole to twice a day.  Plan to continue this  Allergic rhinitis Remains somewhat active, does contribute to her flaring cough and symptoms.  Continue her Zyrtec, Nasonex, Singulair  Hypertension Tolerated going back on losartan  Baltazar Apo, MD, PhD 04/12/2021, 10:26 AM [facility] [phone] or if no answer [phone]

## 2021-04-12 NOTE — Assessment & Plan Note (Signed)
It has been difficult to get her on a stable inhaled regimen due to cost and also side effects.  In particular she has not tolerated the powdered formulations.  Usually cause upper airway irritation and cough flaring.  She has never tried Spiriva Respimat, only the HandiHaler.  I think would be reasonable to try this to see if she gets benefit.  Continue albuterol as needed.  Obviously smoking cessation is needed

## 2021-05-22 ENCOUNTER — Other Ambulatory Visit: Payer: Self-pay | Admitting: Physical Medicine & Rehabilitation

## 2021-06-15 ENCOUNTER — Other Ambulatory Visit (HOSPITAL_COMMUNITY): Payer: Self-pay

## 2021-06-15 ENCOUNTER — Encounter: Payer: Self-pay | Admitting: Emergency Medicine

## 2021-06-15 MED ORDER — PREDNISONE 10 MG PO TABS
ORAL_TABLET | ORAL | 0 refills | Status: AC
  Filled 2021-06-15: qty 30, 12d supply, fill #0

## 2021-06-15 MED ORDER — DOXYCYCLINE HYCLATE 100 MG PO TABS
100.0000 mg | ORAL_TABLET | Freq: Two times a day (BID) | ORAL | 0 refills | Status: DC
  Filled 2021-06-15: qty 14, 7d supply, fill #0

## 2021-06-15 NOTE — Telephone Encounter (Signed)
Ok to call in -  Doxycycline 100mg  bid x 7 days Prednisone > Take 40mg  daily for 3 days, then 30mg  daily for 3 days, then 20mg  daily for 3 days, then 10mg  daily for 3 days, then stop If not improving then have her make an OV

## 2021-06-15 NOTE — Telephone Encounter (Signed)
Notified pt of medications ordered via My Chart message. Medications order placed. Nothing further needed at this time.

## 2021-06-15 NOTE — Telephone Encounter (Signed)
Dr. Lamonte Sakai please on the following My Chart message,   Frances Nelson "Frances Nelson"  P [facility] (supporting Lamonte Sakai, Rose Fillers, MD) 37 minutes ago (10:11 AM)   Hello Dr. Lamonte Sakai, It's that time of year again. Sinus infection, yellow discharge, going into chest coughing up yellow phlegm, no fever. Do I need to make an appointment or can you call in antibiotic and prednisone to [facility] on [address]? Thank you.  Frances Nelson  [phone] DOB: 06/23/1956  Thank you

## 2021-06-19 ENCOUNTER — Encounter: Payer: Self-pay | Admitting: Internal Medicine

## 2021-06-26 ENCOUNTER — Other Ambulatory Visit: Payer: Self-pay | Admitting: Internal Medicine

## 2021-06-28 ENCOUNTER — Other Ambulatory Visit: Payer: Self-pay | Admitting: Emergency Medicine

## 2021-06-28 ENCOUNTER — Other Ambulatory Visit: Payer: Self-pay | Admitting: Family

## 2021-06-28 ENCOUNTER — Other Ambulatory Visit: Payer: Self-pay | Admitting: Physical Medicine & Rehabilitation

## 2021-06-28 ENCOUNTER — Other Ambulatory Visit: Payer: Self-pay | Admitting: Internal Medicine

## 2021-08-22 ENCOUNTER — Telehealth: Payer: Self-pay | Admitting: Internal Medicine

## 2021-08-22 NOTE — Telephone Encounter (Signed)
Pt states she fell down the stairs and hurt her neck, back, ankle, and feet  Offered pt an appt w/ provider on 2-9, provider's next availability, pt declined   Pt requesting a c/b

## 2021-08-24 NOTE — Telephone Encounter (Signed)
Pt expressed understanding

## 2021-08-24 NOTE — Telephone Encounter (Signed)
Left message for patient to call back. Patient should be seen or go to the ED if she has injuries to neck, back, ankles, and feet

## 2021-08-25 ENCOUNTER — Ambulatory Visit (INDEPENDENT_AMBULATORY_CARE_PROVIDER_SITE_OTHER): Payer: Medicare Other

## 2021-08-25 ENCOUNTER — Other Ambulatory Visit: Payer: Self-pay

## 2021-08-25 ENCOUNTER — Ambulatory Visit
Admission: RE | Admit: 2021-08-25 | Discharge: 2021-08-25 | Disposition: A | Discharge status: Home / Self Care | Payer: Medicare Other | Source: Ambulatory Visit | Attending: Physician Assistant | Admitting: Physician Assistant

## 2021-08-25 VITALS — BP 154/70 | HR 86 | Temp 98.2°F | Ht 64.0 in | Wt 212.0 lb

## 2021-08-25 DIAGNOSIS — W19XXXA Unspecified fall, initial encounter: Secondary | ICD-10-CM

## 2021-08-25 DIAGNOSIS — M79671 Pain in right foot: Secondary | ICD-10-CM

## 2021-08-25 DIAGNOSIS — M25572 Pain in left ankle and joints of left foot: Secondary | ICD-10-CM

## 2021-08-25 DIAGNOSIS — M25571 Pain in right ankle and joints of right foot: Secondary | ICD-10-CM | POA: Diagnosis not present

## 2021-08-25 DIAGNOSIS — M79672 Pain in left foot: Secondary | ICD-10-CM

## 2021-08-25 NOTE — ED Triage Notes (Signed)
Patient states that she fell down some stairs on Monday.  She's having problems with both of her feet, right foot is worse than the left.  Patient has taken Tylenol for the pain w/o much relief.

## 2021-08-25 NOTE — ED Provider Notes (Signed)
[facility]    CSN: [id number] Arrival date & time: 08/25/21  1358      History   Chief Complaint Chief Complaint  Patient presents with   Fall    HPI Frances Nelson is a 65 y.o. female.   Patient here today for evaluation of bilateral feet and ankle pain since she fell down her stairs 4 days ago.  She reports that specifically her right foot landed underneath her, and she had hyperextended her left great toe.  She has had pain with walking since fall occurred.  She has tried Tylenol without significant relief.  She denies any numbness or tingling.  She does have known arthritis in her feet.  The history is provided by the patient.  Fall   Past Medical History:  Diagnosis Date   Asthma     Patient Active Problem List   Diagnosis Date Noted   Chronic pain 04/09/2021   Pelvic pain 04/09/2021   HLD (hyperlipidemia) 04/09/2021   Vitamin D deficiency 04/09/2021   Estrogen deficiency 04/09/2021   Hyperglycemia 04/01/2021   Chronic cough 12/30/2020   GERD (gastroesophageal reflux disease) 11/06/2019   Bilateral leg edema 10/16/2019   Pleuritic chest pain 10/16/2019   Hypertension 10/02/2019   Nasal polyps 10/02/2019   Arthralgia 06/28/2018   Sinusitis 06/28/2018   Renal cyst 05/03/2018   Syncope 05/03/2018   Stress and adjustment reaction 05/03/2018   History of tobacco use 05/02/2018   Primary osteoarthritis of both hands 04/10/2018   Primary osteoarthritis of both knees 04/10/2018   Primary osteoarthritis of both feet 04/10/2018   Achilles tendinitis 04/02/2018   Screening for breast cancer 03/15/2018   Positive ANA (antinuclear antibody) 03/15/2018   Screening for colon cancer 03/15/2018   Rash 03/15/2018   Achilles tendon pain 11/13/2017   Cervical myelopathy with cervical radiculopathy ([place]) 11/13/2017   COPD with asthma ([place]) 06/19/2017   Allergic rhinitis 06/19/2017    Past Surgical History:  Procedure Laterality Date   ABDOMINAL  HYSTERECTOMY     APPENDECTOMY     HERNIA REPAIR     Umbilical x 3   TONSILLECTOMY      OB History   No obstetric history on file.      Home Medications    Prior to Admission medications   Medication Sig Start Date End Date Taking? Authorizing Provider  acidophilus (RISAQUAD) CAPS capsule Take 1 capsule by mouth daily.   Yes [provider]  albuterol (PROVENTIL) (2.5 MG/3ML) 0.083% nebulizer solution USE 1 VIAL IN NEBULIZER EVERY 6HRS FOR SHORTNESS OF BREATH/WHEEZING 01/14/21  Yes Collene Gobble, MD  albuterol (VENTOLIN HFA) 108 (90 Base) MCG/ACT inhaler INHALE 2 PUFFS BY MOUTH EVERY 6 HOURS AS NEEDED FOR WHEEZING/SHORTNESS OF BREATH 11/02/20  Yes Collene Gobble, MD  cetirizine (ZYRTEC) 10 MG tablet Take 10 mg by mouth daily.   Yes [provider]  Cholecalciferol (HM VITAMIN D3) 100 MCG (4000 UT) CAPS Take 4,000 Units by mouth daily.   Yes [provider]  doxycycline (VIBRA-TABS) 100 MG tablet Take 1 tablet (100 mg total) by mouth 2 (two) times daily. 06/15/21  Yes Collene Gobble, MD  gabapentin (NEURONTIN) 100 MG capsule TAKE 1 CAPSULE (100 MG TOTAL) BY MOUTH THREE TIMES DAILY. 06/28/21  Yes Biagio Borg, MD  hydrochlorothiazide (HYDRODIURIL) 12.5 MG tablet Take 12.5 mg by mouth daily. 11/29/20  Yes [provider]  losartan (COZAAR) 25 MG tablet TAKE 1 TABLET (25 MG TOTAL) BY MOUTH DAILY. 04/11/21  Yes Collene Gobble, MD  mometasone (NASONEX) 50 MCG/ACT nasal spray Place 2 sprays into the nose daily.   Yes [provider]  montelukast (SINGULAIR) 10 MG tablet Take 1 tablet (10 mg total) by mouth at bedtime. 11/05/20  Yes Martyn Ehrich, NP  Multiple Vitamins-Minerals (MULTIVITAMIN WITH MINERALS) tablet Take 1 tablet by mouth daily.   Yes [provider]  nortriptyline (PAMELOR) 10 MG capsule TAKE 2 CAPSULE BY MOUTH EVERYDAY AT BEDTIME 04/01/21  Yes Biagio Borg, MD  omeprazole (PRILOSEC) 40 MG capsule TAKE 1 CAPSULE (40 MG  TOTAL) BY MOUTH IN THE MORNING AND AT BEDTIME. 06/29/21  Yes Byrum, Rose Fillers, MD  rosuvastatin (CRESTOR) 20 MG tablet Take 1 tablet (20 mg total) by mouth daily. 04/04/21  Yes Biagio Borg, MD  Tiotropium Bromide Monohydrate (SPIRIVA RESPIMAT) 1.25 MCG/ACT AERS Inhale 2 puffs into the lungs daily. 04/12/21  Yes Collene Gobble, MD  Tiotropium Bromide Monohydrate (SPIRIVA RESPIMAT) 1.25 MCG/ACT AERS Inhale 2 puffs into the lungs daily. 04/12/21  Yes Collene Gobble, MD  tiZANidine (ZANAFLEX) 2 MG tablet TAKE 1 TABLET BY MOUTH EVERY 6 HOURS AS NEEDED FOR MUSCLE SPASMS. 06/26/21  Yes Biagio Borg, MD    Family History Family History  Problem Relation Age of Onset   Leukemia Sister    Brain cancer Brother        glioblastoma    Asthma Son    Allergies Son     Social History Social History   Tobacco Use   Smoking status: Every Day    Packs/day: 0.50    Years: 40.00    Pack years: 20.00    Types: Cigarettes   Smokeless tobacco: Never   Tobacco comments:    1.5 packs  cigarettes smoked daily [facility]J 04/12/21  Vaping Use   Vaping Use: Never used  Substance Use Topics   Alcohol use: No   Drug use: Never     Allergies   Symbicort [budesonide-formoterol fumarate], Tramadol, Celebrex [celecoxib], Chocolate flavor, Ciprofloxacin, Eggs or egg-derived products, Flavoring agent, Lisinopril, Lyrica [pregabalin], Penicillins, Wellbutrin [bupropion], and Codeine   Review of Systems Review of Systems  Constitutional:  Negative for chills and fever.  Eyes:  Negative for discharge and redness.  Musculoskeletal:  Positive for arthralgias and joint swelling.  Skin:  Positive for color change.  Neurological:  Negative for weakness and numbness.    Physical Exam Triage Vital Signs ED Triage Vitals  Enc Vitals Group     BP 08/25/21 1410 (!) 154/70     Pulse Rate 08/25/21 1410 86     Resp --      Temp 08/25/21 1410 98.2 F (36.8 C)     Temp Source 08/25/21 1410 Oral     SpO2 08/25/21 1410  95 %     Weight 08/25/21 1412 212 lb (96.2 kg)     Height 08/25/21 1412 5\' 4"  (1.626 m)     Head Circumference --      Peak Flow --      Pain Score 08/25/21 1412 8     Pain Loc --      Pain Edu? --      Excl. in [place]? --    No data found.  Updated Vital Signs BP (!) 154/70 (BP Location: Right Arm)    Pulse 86    Temp 98.2 F (36.8 C) (Oral)    Ht 5\' 4"  (1.626 m)    Wt 212 lb (96.2 kg)  SpO2 95%    BMI 36.39 kg/m      Physical Exam Vitals and nursing note reviewed.  Constitutional:      General: She is not in acute distress.    Appearance: Normal appearance. She is not ill-appearing.  HENT:     Head: Normocephalic and atraumatic.  Eyes:     Conjunctiva/sclera: Conjunctivae normal.  Cardiovascular:     Rate and Rhythm: Normal rate.  Pulmonary:     Effort: Pulmonary effort is normal.  Musculoskeletal:     Comments: Swelling present to bilateral ankles diffusely, tenderness to palpation noted to right lateral malleolus.  Decreased range of motion of bilateral ankles due to pain.  Neurological:     Mental Status: She is alert.  Psychiatric:        Mood and Affect: Mood normal.        Behavior: Behavior normal.        Thought Content: Thought content normal.     UC Treatments / Results  Labs (all labs ordered are listed, but only abnormal results are displayed) Labs Reviewed - No data to display  EKG   Radiology DG Ankle Complete Left  Result Date: 08/25/2021 CLINICAL DATA:  Trauma, fall EXAM: LEFT ANKLE COMPLETE - 3+ VIEW COMPARISON:  None. FINDINGS: No fracture or dislocation is seen. There is soft tissue swelling over the medial and lateral malleoli. Plantar spur is seen in calcaneus. There is smooth marginated coarse calcification in the Achilles tendon close to the calcaneus. IMPRESSION: No recent fracture or dislocation is seen in the left ankle. Small plantar spur is seen in calcaneus. There is dense calcification in the Achilles tendon close to the calcaneus  suggesting calcific tendinosis or bursitis. Electronically Signed   By: Elmer Picker M.D.   On: 08/25/2021 15:05   DG Ankle Complete Right  Result Date: 08/25/2021 CLINICAL DATA:  Trauma, fall EXAM: RIGHT ANKLE - COMPLETE 3+ VIEW COMPARISON:  None. FINDINGS: No recent fracture or dislocation is seen. Small plantar spur is seen in calcaneus. There is small calcification at the attachment of Achilles tendon to the calcaneus. This may suggest bursitis or tendinosis. IMPRESSION: No recent fracture or dislocation is seen in the right ankle. Small plantar spur is seen in calcaneus. Electronically Signed   By: Elmer Picker M.D.   On: 08/25/2021 15:04   DG Foot Complete Left  Result Date: 08/25/2021 CLINICAL DATA:  Trauma, fall EXAM: LEFT FOOT - COMPLETE 3+ VIEW COMPARISON:  None. FINDINGS: No recent fracture or dislocation is seen. Bony spurs seen in the dorsal aspect of intertarsal and tarsometatarsal joints. Small plantar spur is seen in the calcaneus. Minimal bony spurs seen in first metatarsophalangeal joint. There is linear smooth marginated calcific density at the attachment of Achilles tendon to the calcaneus. IMPRESSION: No recent fracture or dislocation is seen in the left foot. Electronically Signed   By: Elmer Picker M.D.   On: 08/25/2021 14:58   DG Foot Complete Right  Result Date: 08/25/2021 CLINICAL DATA:  Trauma, fall EXAM: RIGHT FOOT COMPLETE - 3+ VIEW COMPARISON:  None. FINDINGS: No recent fracture or dislocation is seen. Small plantar spur is seen in calcaneus. Small coarse calcifications are seen at the attachment of Achilles tendon to the calcaneus. Small bony spurs seen in first metatarsophalangeal joint and base of fifth tarsometatarsal joint. IMPRESSION: No recent fracture or dislocation is seen in the right foot. Small plantar spur is seen in calcaneus. Degenerative changes are noted in the first metatarsophalangeal joint  with small bony spurs. Electronically Signed    By: Elmer Picker M.D.   On: 08/25/2021 15:00    Procedures Procedures (including critical care time)  Medications Ordered in UC Medications - No data to display  Initial Impression / Assessment and Plan / UC Course  I have reviewed the triage vital signs and the nursing notes.  Pertinent labs & imaging results that were available during my care of the patient were reviewed by me and considered in my medical decision making (see chart for details).    X-rays ordered of bilateral ankles as well as bilateral feet given mechanism of injury.  No fractures noted.  Recommended further evaluation by Ortho if symptoms do not improve over the next several days as she may need further imaging.  Patient expresses understanding.  Final Clinical Impressions(s) / UC Diagnoses   Final diagnoses:  Fall, initial encounter  Bilateral foot pain  Acute bilateral ankle pain   Discharge Instructions   None    ED Prescriptions   None    PDMP not reviewed this encounter.   Francene Finders, PA-C 08/25/21 1544

## 2021-09-21 ENCOUNTER — Ambulatory Visit (INDEPENDENT_AMBULATORY_CARE_PROVIDER_SITE_OTHER): Payer: Medicare Other | Admitting: Emergency Medicine

## 2021-09-21 ENCOUNTER — Other Ambulatory Visit: Payer: Self-pay

## 2021-09-21 ENCOUNTER — Encounter: Payer: Self-pay | Admitting: Emergency Medicine

## 2021-09-21 DIAGNOSIS — J301 Allergic rhinitis due to pollen: Secondary | ICD-10-CM | POA: Diagnosis not present

## 2021-09-21 DIAGNOSIS — K219 Gastro-esophageal reflux disease without esophagitis: Secondary | ICD-10-CM

## 2021-09-21 DIAGNOSIS — J449 Chronic obstructive pulmonary disease, unspecified: Secondary | ICD-10-CM

## 2021-09-21 NOTE — Assessment & Plan Note (Signed)
Continue omeprazole twice a day ?

## 2021-09-21 NOTE — Patient Instructions (Signed)
We will work on trying to see if we can get adequate insurance coverage for your albuterol.  We will try to refill your albuterol inhaler and your nebulizer solution.  Plan will be to use albuterol either 2 puffs or 1 nebulizer treatment up to every 4 hours if needed for shortness of breath, chest tightness, wheezing. ?We will try to determine the insurance coverage and cost for Spiriva Respimat or similar medication. ?Please continue your Singulair, Nasonex, Zyrtec as you have been taking them. ?Continue omeprazole twice a day ?You would benefit from decreasing your cigarettes.  Certainly smoking is contributing to your symptoms and making them harder to manage. ?Follow with Dr Lamonte Sakai in 6 months or sooner if you have any problems ? ?

## 2021-09-21 NOTE — Progress Notes (Signed)
? ?Subjective:  ? ? Patient ID: Frances Nelson, female    DOB: September 19, 1955, 66 y.o.   MRN: [id number] ? ?COPD ?She complains of cough and shortness of breath. There is no wheezing. Pertinent negatives include no ear pain, fever, headaches, postnasal drip, rhinorrhea, sneezing, sore throat or trouble swallowing. Her past medical history is significant for COPD.    ? ? ?ROV 04/12/21 --follow-up visit for 66 year old woman with history of active tobacco use, COPD/asthma, allergic rhinitis, GERD, chronic cough.  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 ?It has been difficult to get her on a stable inhaled regimen due to inability to tolerate, cost and formulary issues.  She uses albuterol usually about 2x a day - helps her breathing and coughing. She can produce some mucous after using it. She believes that her cough has been worse for about 2 weeks, ? Related to addition gabapentin. She has stable but daily congestion and drainage, usually in the am.  ?She is on Singulair, zyrtec, nasonex, losartan.  Last visit I increased her omeprazole to 40 mg twice daily due to breakthrough GERD - better managed, still occasional breakthrough. She smokes 1.5pk/day.  ? ? ?ROV 09/21/21 --Frances Nelson is 74, has a history of tobacco use (1.5 packs/day), COPD/asthma.  She has chronic cough due to this as well as GERD and allergic rhinitis. ?PMH: Positive ANA, p-ANCA without any known pulmonary manifestations ?Currently managed on Zyrtec, Nasonex, Singulair, omeprazole twice a day.  I tried her on Spiriva Respimat (poorly tolerates powdered inhalers). She reports  ?She was treated for an acute bronchitis/sinusitis in November with prednisone, doxycycline ?Today she reports that she is doing. She is unsure whether she got much out of the Spiriva Respimat when she used it for a month. She is having a lot of nasal congestion, feels worst in the am and pm - does ok in the middle of the day. Increased cough,  clear mucous. Her albuterol and spiriva are apparently not covered by her insurance. She using albuterol about 1x a day, either HFA or nebulized.  ? ? ? ?Review of Systems  ?Constitutional:  Negative for fever and unexpected weight change.  ?HENT:  Positive for congestion. Negative for dental problem, ear pain, nosebleeds, postnasal drip, rhinorrhea, sinus pressure, sneezing, sore throat and trouble swallowing.   ?Eyes:  Negative for redness and itching.  ?Respiratory:  Positive for cough and shortness of breath. Negative for chest tightness and wheezing.   ?Cardiovascular:  Negative for palpitations and leg swelling.  ?Gastrointestinal:  Negative for nausea and vomiting.  ?Genitourinary:  Negative for dysuria.  ?Musculoskeletal:  Negative for joint swelling.  ?Skin:  Negative for rash.  ?Neurological:  Negative for headaches.  ?Hematological:  Does not bruise/bleed easily.  ?Psychiatric/Behavioral:  Negative for dysphoric mood. The patient is not nervous/anxious.   ? ?   ?Objective:  ? Physical Exam ?Vitals:  ? 09/21/21 1453  ?BP: 134/74  ?Pulse: 85  ?Temp: 98.3 ?F (36.8 ?C)  ?TempSrc: Oral  ?SpO2: 95%  ?Weight: 216 lb 9.6 oz (98.2 kg)  ?Height: '5\' 4"'$  (1.626 m)  ? ? ?Gen: Pleasant, overweight woman, in no distress,  normal affect ? ?ENT: No lesions,  mouth clear,  oropharynx clear, no postnasal drip, hoarse voice ? ?Neck: No JVD, no stridor ? ?Lungs: No use of accessory muscles, distant clear ? ?Cardiovascular: RRR, heart sounds normal, no murmur or gallops, no peripheral edema ? ?Musculoskeletal: No deformities, no cyanosis or clubbing.  ? ?Neuro:  alert, non focal ? ?Skin: Warm, no lesions or rash ? ? ?   ?Assessment & Plan:  ?COPD with asthma ([place]) ?With persistent symptoms including chest congestion, cough, dyspnea.  She does use albuterol and it is effective.  There are significant barriers to obtaining both the albuterol and any maintenance inhaler.  She may have benefited from the [place] but states  it is not covered by her insurance.  Unclear to me which version of albuterol we need to order to get the best coverage situation.  Her albuterol neb solution should probably be through Medicare part D.  We will work on this with our clinical pharmacist.  Certainly the smoking is contributing to both her COPD symptoms and her chronic rhinitis.  She needs to cut down, ultimately stop. ? ?We will work on trying to see if we can get adequate insurance coverage for your albuterol.  We will try to refill your albuterol inhaler and your nebulizer solution.  Plan will be to use albuterol either 2 puffs or 1 nebulizer treatment up to every 4 hours if needed for shortness of breath, chest tightness, wheezing. ?We will try to determine the insurance coverage and cost for Spiriva Respimat or similar medication. ?You would benefit from decreasing your cigarettes.  Certainly smoking is contributing to your symptoms and making them harder to manage. ?Follow with Dr Lamonte Sakai in 6 months or sooner if you have any problems ? ?Allergic rhinitis ?Please continue your Singulair, Nasonex, Zyrtec as you have been taking them. ? ?GERD (gastroesophageal reflux disease) ?Continue omeprazole twice a day ? ? ?Frances Apo, MD, PhD ?09/21/2021, 3:10 PM ?[facility] ?[phone] or if no answer [phone] ? ?

## 2021-09-21 NOTE — Assessment & Plan Note (Signed)
With persistent symptoms including chest congestion, cough, dyspnea.  She does use albuterol and it is effective.  There are significant barriers to obtaining both the albuterol and any maintenance inhaler.  She may have benefited from the [place] but states it is not covered by her insurance.  Unclear to me which version of albuterol we need to order to get the best coverage situation.  Her albuterol neb solution should probably be through Medicare part D.  We will work on this with our clinical pharmacist.  Certainly the smoking is contributing to both her COPD symptoms and her chronic rhinitis.  She needs to cut down, ultimately stop. ? ?We will work on trying to see if we can get adequate insurance coverage for your albuterol.  We will try to refill your albuterol inhaler and your nebulizer solution.  Plan will be to use albuterol either 2 puffs or 1 nebulizer treatment up to every 4 hours if needed for shortness of breath, chest tightness, wheezing. ?We will try to determine the insurance coverage and cost for Spiriva Respimat or similar medication. ?You would benefit from decreasing your cigarettes.  Certainly smoking is contributing to your symptoms and making them harder to manage. ?Follow with Dr Lamonte Sakai in 6 months or sooner if you have any problems ?

## 2021-09-21 NOTE — Assessment & Plan Note (Signed)
Please continue your Singulair, Nasonex, Zyrtec as you have been taking them. ?

## 2021-09-29 ENCOUNTER — Encounter: Payer: Self-pay | Admitting: Internal Medicine

## 2021-09-29 ENCOUNTER — Other Ambulatory Visit: Payer: Self-pay | Admitting: Emergency Medicine

## 2021-09-29 ENCOUNTER — Ambulatory Visit (INDEPENDENT_AMBULATORY_CARE_PROVIDER_SITE_OTHER): Payer: Medicare Other | Admitting: Internal Medicine

## 2021-09-29 ENCOUNTER — Other Ambulatory Visit: Payer: Self-pay

## 2021-09-29 VITALS — BP 130/80 | HR 82 | Temp 98.4°F | Ht 64.0 in | Wt 218.0 lb

## 2021-09-29 DIAGNOSIS — M7662 Achilles tendinitis, left leg: Secondary | ICD-10-CM

## 2021-09-29 DIAGNOSIS — M5442 Lumbago with sciatica, left side: Secondary | ICD-10-CM

## 2021-09-29 DIAGNOSIS — I10 Essential (primary) hypertension: Secondary | ICD-10-CM

## 2021-09-29 DIAGNOSIS — M25562 Pain in left knee: Secondary | ICD-10-CM | POA: Diagnosis not present

## 2021-09-29 DIAGNOSIS — Z1211 Encounter for screening for malignant neoplasm of colon: Secondary | ICD-10-CM

## 2021-09-29 DIAGNOSIS — G8929 Other chronic pain: Secondary | ICD-10-CM

## 2021-09-29 DIAGNOSIS — E7800 Pure hypercholesterolemia, unspecified: Secondary | ICD-10-CM

## 2021-09-29 DIAGNOSIS — E559 Vitamin D deficiency, unspecified: Secondary | ICD-10-CM | POA: Diagnosis not present

## 2021-09-29 DIAGNOSIS — R739 Hyperglycemia, unspecified: Secondary | ICD-10-CM | POA: Diagnosis not present

## 2021-09-29 DIAGNOSIS — E538 Deficiency of other specified B group vitamins: Secondary | ICD-10-CM | POA: Diagnosis not present

## 2021-09-29 DIAGNOSIS — F32A Depression, unspecified: Secondary | ICD-10-CM | POA: Diagnosis not present

## 2021-09-29 DIAGNOSIS — M170 Bilateral primary osteoarthritis of knee: Secondary | ICD-10-CM

## 2021-09-29 LAB — LIPID PANEL
Cholesterol: 228 mg/dL — ABNORMAL HIGH (ref 0–200)
HDL: 43.7 mg/dL (ref >39.00)
NonHDL: 183.87
Total CHOL/HDL Ratio: 5
Triglycerides: 347 mg/dL — ABNORMAL HIGH (ref 0.0–149.0)
VLDL: 69.4 mg/dL — ABNORMAL HIGH (ref 0.0–40.0)

## 2021-09-29 LAB — CBC WITH DIFFERENTIAL/PLATELET
Basophils Absolute: 0 10*3/uL (ref 0.0–0.1)
Basophils Relative: 0.4 % (ref 0.0–3.0)
Eosinophils Absolute: 0.1 10*3/uL (ref 0.0–0.7)
Eosinophils Relative: 1.6 % (ref 0.0–5.0)
HCT: 43.5 % (ref 36.0–46.0)
Hemoglobin: 14.7 g/dL (ref 12.0–15.0)
Lymphocytes Relative: 24.4 % (ref 12.0–46.0)
Lymphs Abs: 2.1 10*3/uL (ref 0.7–4.0)
MCHC: 33.9 g/dL (ref 30.0–36.0)
MCV: 91.6 fl (ref 78.0–100.0)
Monocytes Absolute: 0.5 10*3/uL (ref 0.1–1.0)
Monocytes Relative: 6.3 % (ref 3.0–12.0)
Neutro Abs: 5.8 10*3/uL (ref 1.4–7.7)
Neutrophils Relative %: 67.3 % (ref 43.0–77.0)
Platelets: 311 10*3/uL (ref 150.0–400.0)
RBC: 4.75 Mil/uL (ref 3.87–5.11)
RDW: 13.6 % (ref 11.5–15.5)
WBC: 8.6 10*3/uL (ref 4.0–10.5)

## 2021-09-29 LAB — LDL CHOLESTEROL, DIRECT: Direct LDL: 149 mg/dL

## 2021-09-29 LAB — URINALYSIS, ROUTINE W REFLEX MICROSCOPIC
Bilirubin Urine: NEGATIVE
Ketones, ur: NEGATIVE
Leukocytes,Ua: NEGATIVE
Nitrite: NEGATIVE
Specific Gravity, Urine: 1.01 (ref 1.000–1.030)
Total Protein, Urine: NEGATIVE
Urine Glucose: NEGATIVE
Urobilinogen, UA: 0.2 (ref 0.0–1.0)
pH: 6 (ref 5.0–8.0)

## 2021-09-29 LAB — BASIC METABOLIC PANEL
BUN: 17 mg/dL (ref 6–23)
CO2: 31 mEq/L (ref 19–32)
Calcium: 10.1 mg/dL (ref 8.4–10.5)
Chloride: 99 mEq/L (ref 96–112)
Creatinine, Ser: 0.71 mg/dL (ref 0.40–1.20)
GFR: 88.95 mL/min (ref >60.00)
Glucose, Bld: 109 mg/dL — ABNORMAL HIGH (ref 70–99)
Potassium: 4.1 mEq/L (ref 3.5–5.1)
Sodium: 137 mEq/L (ref 135–145)

## 2021-09-29 LAB — HEMOGLOBIN A1C: Hgb A1c MFr Bld: 6.2 % (ref 4.6–6.5)

## 2021-09-29 LAB — VITAMIN B12: Vitamin B-12: 414 pg/mL (ref 211–911)

## 2021-09-29 LAB — VITAMIN D 25 HYDROXY (VIT D DEFICIENCY, FRACTURES): VITD: 71.59 ng/mL (ref 30.00–100.00)

## 2021-09-29 LAB — HEPATIC FUNCTION PANEL
ALT: 27 U/L (ref 0–35)
AST: 22 U/L (ref 0–37)
Albumin: 4.2 g/dL (ref 3.5–5.2)
Alkaline Phosphatase: 88 U/L (ref 39–117)
Bilirubin, Direct: 0.1 mg/dL (ref 0.0–0.3)
Total Bilirubin: 0.5 mg/dL (ref 0.2–1.2)
Total Protein: 7.4 g/dL (ref 6.0–8.3)

## 2021-09-29 LAB — TSH: TSH: 2.73 u[IU]/mL (ref 0.35–5.50)

## 2021-09-29 MED ORDER — METHOCARBAMOL 750 MG PO TABS: 750.0000 mg | ORAL_TABLET | Freq: Four times a day (QID) | ORAL | 2 refills | Status: DC | PRN

## 2021-09-29 MED ORDER — CITALOPRAM HYDROBROMIDE 10 MG PO TABS: 10.0000 mg | ORAL_TABLET | Freq: Every day | ORAL | 3 refills | Status: DC

## 2021-09-29 NOTE — Patient Instructions (Addendum)
Your tizanidine was changed to robaxin as needed ? ?You will be contacted regarding the referral for: cologuard ? ?Please take all new medication as prescribed - the celexa 10 mg per day ? ?Please continue all other medications as before, and refills have been done if requested. ? ?Please have the pharmacy call with any other refills you may need. ? ?Please continue your efforts at being more active, low cholesterol diet, and weight control. ? ?You are otherwise up to date with prevention measures today. ? ?Please keep your appointments with your specialists as you may have planned ? ?You will be contacted regarding the referral for: sports medicine ? ?Please go to the LAB at the blood drawing area for the tests to be done ? ?You will be contacted by phone if any changes need to be made immediately.  Otherwise, you will receive a letter about your results with an explanation, but please check with MyChart first. ? ?Please remember to sign up for MyChart if you have not done so, as this will be important to you in the future with finding out test results, communicating by private email, and scheduling acute appointments online when needed. ? ?Please make an Appointment to return in 6 months, or sooner if needed ? ?

## 2021-09-29 NOTE — Progress Notes (Signed)
Patient ID: Frances Nelson, female   DOB: 11-13-1955, 66 y.o.   MRN: [id number] ? ? ? ?     Chief Complaint:: yearly exam and depressio ? ?     HPI:  Frances Nelson is a 66 y.o. female here for yearly exam; due for cologuard , and will schedule her dxa soon,; declines all vaccinations; o/w up to date ?              Also Several family passed since oct 2022  Son, mother, brother-law and cousin. Has been grieving and now has had mild worsening depressive symptoms, but no suicidal ideation, or panic; has ongoing anxiety as well.  Pt denies chest pain, increased sob or doe, wheezing, orthopnea, PND, increased LE swelling, palpitations, dizziness or syncope.   Pt denies polydipsia, polyuria, or new focal neuro s/s.    Pt denies fever, wt loss, night sweats, loss of appetite, or other constitutional symptoms  tizanidine too sedationg, asks for change back to robaxin.  She read online that gabapentin can cause cough so wondering if might be true in her case.  Also has left knee arthritis with intermittent swelling, and now worsening egg shaped swelling to the mid back of the knee as well.  Also, Pt continues to have recurring LBP left > right with radidation to the toes, but no bowel or bladder change, fever, wt loss,  worsening LE numbness/weakness, gait change or falls.  Trying to follow lower chol diet, declines statin ?  ?Wt Readings from Last 3 Encounters:  ?10/03/21 218 lb (98.9 kg)  ?09/29/21 218 lb (98.9 kg)  ?09/21/21 216 lb 9.6 oz (98.2 kg)  ? ?BP Readings from Last 3 Encounters:  ?10/03/21 136/78  ?09/29/21 130/80  ?09/21/21 134/74  ? ?Immunization History  ?Administered Date(s) Administered  ? PFIZER(Purple Top)SARS-COV-2 Vaccination 11/06/2019, 12/01/2019  ? Tdap 03/15/2018  ? ?Health Maintenance Due  ?Topic Date Due  ? DEXA SCAN  Never done  ? Fecal DNA (Cologuard)  07/13/2021  ? ?  ? ?Past Medical History:  ?Diagnosis Date  ? Asthma   ? ?Past Surgical History:  ?Procedure Laterality Date  ? ABDOMINAL  HYSTERECTOMY    ? APPENDECTOMY    ? HERNIA REPAIR    ? Umbilical x 3  ? TONSILLECTOMY    ? ? reports that she has been smoking cigarettes. She has a 20.00 pack-year smoking history. She has never used smokeless tobacco. She reports that she does not drink alcohol and does not use drugs. ?family history includes Allergies in her son; Asthma in her son; Brain cancer in her brother; Leukemia in her sister. ?Allergies  ?Allergen Reactions  ? Symbicort [Budesonide-Formoterol Fumarate] Other (See Comments)  ?  Patient reported dizziness, nausea, headaches and sore throat while using Symbicort 160. Reported on 09/03/17  ? Tramadol Hives  ? Celebrex [Celecoxib]   ?  Bleeding.    ? Chocolate Flavor   ? Ciprofloxacin Nausea And Vomiting  ?  Headache, shaking.   ? Eggs Or Egg-Derived Products   ? Flavoring Agent   ?  Unknown  ? Lisinopril Cough  ?  Pt off med for a week, some improvement  ? Lyrica [Pregabalin]   ?  "Made me out of it."   ? Penicillins   ?  Patient preference  ? Wellbutrin [Bupropion]   ? Codeine Palpitations  ? ?Current Outpatient Medications on File Prior to Visit  ?Medication Sig Dispense Refill  ? acidophilus (RISAQUAD) CAPS capsule Take 1 capsule by  mouth daily.    ? Calcium-Vitamins C & D (CALCIUM/C/D PO) Take by mouth.    ? cetirizine (ZYRTEC) 10 MG tablet Take 10 mg by mouth daily.    ? Cholecalciferol (HM VITAMIN D3) 100 MCG (4000 UT) CAPS Take 4,000 Units by mouth daily.    ? gabapentin (NEURONTIN) 100 MG capsule TAKE 1 CAPSULE (100 MG TOTAL) BY MOUTH THREE TIMES DAILY. 270 capsule 1  ? hydrochlorothiazide (HYDRODIURIL) 12.5 MG tablet Take 12.5 mg by mouth daily.    ? losartan (COZAAR) 25 MG tablet TAKE 1 TABLET (25 MG TOTAL) BY MOUTH DAILY. 90 tablet 2  ? mometasone (NASONEX) 50 MCG/ACT nasal spray Place 2 sprays into the nose daily.    ? montelukast (SINGULAIR) 10 MG tablet Take 1 tablet (10 mg total) by mouth at bedtime. 30 tablet 11  ? Multiple Vitamins-Minerals (MULTIVITAMIN WITH MINERALS) tablet  Take 1 tablet by mouth daily.    ? niacin 500 MG tablet     ? albuterol (PROVENTIL) (2.5 MG/3ML) 0.083% nebulizer solution USE 1 VIAL IN NEBULIZER EVERY 6HRS FOR SHORTNESS OF BREATH/WHEEZING 300 mL 5  ? albuterol (VENTOLIN HFA) 108 (90 Base) MCG/ACT inhaler INHALE 2 PUFFS BY MOUTH EVERY 6 HOURS AS NEEDED FOR WHEEZING/SHORTNESS OF BREATH 18 g 11  ? ?No current facility-administered medications on file prior to visit.  ? ?     ROS:  All others reviewed and negative. ? ?Objective  ? ?     PE:  BP 130/80 (BP Location: Right Arm, Patient Position: Sitting, Cuff Size: Large)   Pulse 82   Temp 98.4 ?F (36.9 ?C) (Oral)   Ht '5\' 4"'$  (1.626 m)   Wt 218 lb (98.9 kg)   SpO2 97%   BMI 37.42 kg/m?  ? ?              Constitutional: Pt appears in NAD ?              HENT: Head: NCAT.  ?              Right Ear: External ear normal.   ?              Left Ear: External ear normal.  ?              Eyes: . Pupils are equal, round, and reactive to light. Conjunctivae and EOM are normal ?              Nose: without d/c or deformity ?              Neck: Neck supple. Gross normal ROM ?              Cardiovascular: Normal rate and regular rhythm.   ?              Pulmonary/Chest: Effort normal and breath sounds without rales or wheezing.  ?              Abd:  Soft, NT, ND, + BS, no organomegaly ?              Neurological: Pt is alert. At baseline orientation, motor grossly intact ?              Skin: Skin is warm. No rashes, no other new lesions, LE edema - none ?              Psychiatric: Pt behavior is normal without agitation  ? ?Micro: none ? ?Cardiac tracings  I have personally interpreted today:  none ? ?Pertinent Radiological findings (summarize): none  ? ?Lab Results  ?Component Value Date  ? WBC 7.6 10/03/2021  ? HGB 14.9 10/03/2021  ? HCT 44.0 10/03/2021  ? PLT 313.0 10/03/2021  ? GLUCOSE 118 (H) 10/03/2021  ? CHOL 228 (H) 09/29/2021  ? TRIG 347.0 (H) 09/29/2021  ? HDL 43.70 09/29/2021  ? LDLDIRECT 149.0 09/29/2021  ? ALT 24  10/03/2021  ? AST 19 10/03/2021  ? NA 139 10/03/2021  ? K 3.8 10/03/2021  ? CL 100 10/03/2021  ? CREATININE 0.78 10/03/2021  ? BUN 14 10/03/2021  ? CO2 31 10/03/2021  ? TSH 2.73 09/29/2021  ? HGBA1C 6.2 09/29/2021  ? ?Assessment/Plan:  ?Frances Nelson is a 66 y.o. White or Caucasian [1] female with  has a past medical history of Asthma. ? ?Vitamin D deficiency ?Last vitamin D ?Lab Results  ?Component Value Date  ? VD25OH 71.59 09/29/2021  ? ?Stable, cont oral replacement ? ? ?Hypertension ?BP Readings from Last 3 Encounters:  ?10/03/21 136/78  ?09/29/21 130/80  ?09/21/21 134/74  ? ?Stable, pt to continue medical treatment losartan, hct ? ? ?Hyperglycemia ?Lab Results  ?Component Value Date  ? HGBA1C 6.2 09/29/2021  ? ?Stable, pt to continue current medical treatment  - diet ? ? ?HLD (hyperlipidemia) ?Direct LDL mg/dL 149.0  141.0   ? ?Stable, pt to continue current diet - declines statin ? ? ?Depression ?Mild to mod, no SI or HI, for celexa 10 qd,  to f/u any worsening symptoms or concerns, declines referral for counseling or psychiatry ? ?Screening for colon cancer ?For cologuard ? ?Primary osteoarthritis of both knees ?Left > right - cont mobic prn ? 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 ?Followup: Return in about 6 months (around 04/01/2022). ? ?Cathlean Cower, MD 10/04/2021 11:18 AM ?[facility] ?[place] ?Internal Medicine ?

## 2021-09-30 NOTE — Progress Notes (Signed)
? ? Benito Mccreedy D.Merril Abbe ?[facility] ?[place] ?Phone: [phone] ?  ?Assessment and Plan:   ?  ?1. Positive ANA (antinuclear antibody) ?2. Achilles tendinitis of both lower extremities ?3. Primary osteoarthritis of both hands ?4. Primary osteoarthritis of both knees ?5. Primary osteoarthritis of both feet ?-Chronic with exacerbation, initial sports medicine visit ?- Multiple chronic musculoskeletal complaints that have been affecting patient for years with history of positive ANA, no recent rheumatology visit, no recent rheumatology work-up ?- Start meloxicam 7.5 mg and if well-tolerated increase to 15 mg daily x2 weeks.  If still having pain after 2 weeks, complete 3rd-week of meloxicam. May use remaining meloxicam as needed once daily for pain control.  Do not to use additional NSAIDs while taking meloxicam.  May use Tylenol [phone] mg 2 to 3 times a day for breakthrough pain.  ?- ANA; Future ?- CBC with Differential/Platelet; Future ?- Comprehensive metabolic panel; Future ?- C-reactive protein; Future ?- Cyclic citrul peptide antibody, IgG; Future ?- Uric acid; Future ?- Sedimentation rate; Future ?- Rheumatoid factor; Future ?- Rheumatoid factor ?- Sedimentation rate ?- Uric acid ?- Cyclic citrul peptide antibody, IgG ?- C-reactive protein ?- Comprehensive metabolic panel ?- CBC with Differential/Platelet ?- ANA  ?  ?Pertinent previous records reviewed include prior imaging of bilateral knees, bilateral feet, bilateral ankles ?  ?Follow Up: 3 weeks for reevaluation.  We will review lab work at that time and discuss if it is necessary for rheumatology referral.  We will see if meloxicam had any benefit for patient.  Could consider further evaluation of patient's left knee due to pain after fall, CSI, further discussion of treatment bilateral foot pain ?  ?Subjective:   ?I, Pincus Badder, am serving as a Education administrator for [employer] ? ?Chief  Complaint: left knee, left ankle, and low back  ? ?HPI:  ?10/03/2021 ?Patient is a 66 year old female complaining of left ankle, left leg, and low back pain. Patient states that her left sided pain started 2015 hasnt been getting any better , Achilles tendonitis bilateral left is more painful, left knee pain , fell down the stairs feb 7th and caught herself her legs were underneath her in a dive position her knee had given out on her. Has picture of her ankle from her fall, low back pain is all the way across pain is mostly in the middle , has numbness and tingling her toes are always numb gets weird sensations , feels like ice cold water is dripping down the back of her leg, gets really sharp pains on her legs and feet, was taking ib for the swelling but has had reactions with it nausea, seems to work in reverse the ib makes the swelling worst, takes tylenol arthritis 650 mg that seems to help a little, when she takes she is able to sleep at night. Pt says she has a lump on her left heel and left knee and a cyst in the back of her knee ? ? ?Relevant Historical Information: Positive ANA in 2019, but no recent follow-up with rheumatology, chronic pain in multiple locations, hypertension, COPD ? ?Additional pertinent review of systems negative. ? ? ?Current Outpatient Medications:  ?  acidophilus (RISAQUAD) CAPS capsule, Take 1 capsule by mouth daily., Disp: , Rfl:  ?  albuterol (PROVENTIL) (2.5 MG/3ML) 0.083% nebulizer solution, USE 1 VIAL IN NEBULIZER EVERY 6HRS FOR SHORTNESS OF BREATH/WHEEZING, Disp: 300 mL, Rfl: 5 ?  albuterol (VENTOLIN HFA)  108 (90 Base) MCG/ACT inhaler, INHALE 2 PUFFS BY MOUTH EVERY 6 HOURS AS NEEDED FOR WHEEZING/SHORTNESS OF BREATH, Disp: 18 g, Rfl: 11 ?  Calcium-Vitamins C & D (CALCIUM/C/D PO), Take by mouth., Disp: , Rfl:  ?  cetirizine (ZYRTEC) 10 MG tablet, Take 10 mg by mouth daily., Disp: , Rfl:  ?  Cholecalciferol (HM VITAMIN D3) 100 MCG (4000 UT) CAPS, Take 4,000 Units by mouth daily.,  Disp: , Rfl:  ?  citalopram (CELEXA) 10 MG tablet, Take 1 tablet (10 mg total) by mouth daily., Disp: 90 tablet, Rfl: 3 ?  gabapentin (NEURONTIN) 100 MG capsule, TAKE 1 CAPSULE (100 MG TOTAL) BY MOUTH THREE TIMES DAILY., Disp: 270 capsule, Rfl: 1 ?  hydrochlorothiazide (HYDRODIURIL) 12.5 MG tablet, Take 12.5 mg by mouth daily., Disp: , Rfl:  ?  losartan (COZAAR) 25 MG tablet, TAKE 1 TABLET (25 MG TOTAL) BY MOUTH DAILY., Disp: 90 tablet, Rfl: 2 ?  meloxicam (MOBIC) 15 MG tablet, Take 0.5 tablets (7.5 mg total) by mouth daily., Disp: 60 tablet, Rfl: 0 ?  methocarbamol (ROBAXIN-750) 750 MG tablet, Take 1 tablet (750 mg total) by mouth every 6 (six) hours as needed for muscle spasms., Disp: 120 tablet, Rfl: 2 ?  mometasone (NASONEX) 50 MCG/ACT nasal spray, Place 2 sprays into the nose daily., Disp: , Rfl:  ?  montelukast (SINGULAIR) 10 MG tablet, Take 1 tablet (10 mg total) by mouth at bedtime., Disp: 30 tablet, Rfl: 11 ?  Multiple Vitamins-Minerals (MULTIVITAMIN WITH MINERALS) tablet, Take 1 tablet by mouth daily., Disp: , Rfl:  ?  niacin 500 MG tablet, , Disp: , Rfl:  ?  nortriptyline (PAMELOR) 10 MG capsule, TAKE 2 CAPSULE BY MOUTH EVERYDAY AT BEDTIME, Disp: 180 capsule, Rfl: 1 ?  omeprazole (PRILOSEC) 40 MG capsule, TAKE 1 CAPSULE (40 MG TOTAL) BY MOUTH IN THE MORNING AND AT BEDTIME., Disp: 180 capsule, Rfl: 3  ? ?Objective:   ?  ?Vitals:  ? 10/03/21 1345  ?BP: 136/78  ?Pulse: 80  ?SpO2: 97%  ?Weight: 218 lb (98.9 kg)  ?Height: '5\' 4"'$  (1.626 m)  ?  ?  ?Body mass index is 37.42 kg/m?.  ?  ?Physical Exam:   ? ?General: Well-appearing, cooperative, sitting comfortably in no acute distress.  ?HEENT: Normocephalic, atraumatic.   ?Neck: No gross abnormality.  ?Cardiovascular: No pallor or cyanosis. ?Resp: Comfortable WOB.   ?Abdomen: Non distended.   ?Skin: Warm and dry; no focal rashes identified on limited exam. ?Extremities: No cyanosis or edema.  Baker's cyst of left knee ?Neuro: Gross motor and sensory intact. Gait  normal. ?Psychiatric: Mood and affect are appropriate.   ?Neuro: CN II-XII intact; strength and sensation intact generally, reflexes 2+ in upper and lower extremities   ? ? ?Electronically signed by:  ?Benito Mccreedy D.Merril Abbe ?[facility] ?2:58 PM 10/03/21 ?

## 2021-10-01 ENCOUNTER — Other Ambulatory Visit: Payer: Self-pay | Admitting: Internal Medicine

## 2021-10-03 ENCOUNTER — Ambulatory Visit (INDEPENDENT_AMBULATORY_CARE_PROVIDER_SITE_OTHER): Payer: Medicare Other | Admitting: Sports Medicine

## 2021-10-03 ENCOUNTER — Other Ambulatory Visit: Payer: Self-pay

## 2021-10-03 VITALS — BP 136/78 | HR 80 | Ht 64.0 in | Wt 218.0 lb

## 2021-10-03 DIAGNOSIS — R768 Other specified abnormal immunological findings in serum: Secondary | ICD-10-CM | POA: Diagnosis not present

## 2021-10-03 DIAGNOSIS — M19042 Primary osteoarthritis, left hand: Secondary | ICD-10-CM

## 2021-10-03 DIAGNOSIS — M19041 Primary osteoarthritis, right hand: Secondary | ICD-10-CM

## 2021-10-03 DIAGNOSIS — M7662 Achilles tendinitis, left leg: Secondary | ICD-10-CM

## 2021-10-03 DIAGNOSIS — M170 Bilateral primary osteoarthritis of knee: Secondary | ICD-10-CM

## 2021-10-03 DIAGNOSIS — M19072 Primary osteoarthritis, left ankle and foot: Secondary | ICD-10-CM

## 2021-10-03 DIAGNOSIS — M7661 Achilles tendinitis, right leg: Secondary | ICD-10-CM

## 2021-10-03 DIAGNOSIS — M19071 Primary osteoarthritis, right ankle and foot: Secondary | ICD-10-CM

## 2021-10-03 LAB — COMPREHENSIVE METABOLIC PANEL
ALT: 24 U/L (ref 0–35)
AST: 19 U/L (ref 0–37)
Albumin: 4.3 g/dL (ref 3.5–5.2)
Alkaline Phosphatase: 88 U/L (ref 39–117)
BUN: 14 mg/dL (ref 6–23)
CO2: 31 mEq/L (ref 19–32)
Calcium: 9.9 mg/dL (ref 8.4–10.5)
Chloride: 100 mEq/L (ref 96–112)
Creatinine, Ser: 0.78 mg/dL (ref 0.40–1.20)
GFR: 79.46 mL/min (ref >60.00)
Glucose, Bld: 118 mg/dL — ABNORMAL HIGH (ref 70–99)
Potassium: 3.8 mEq/L (ref 3.5–5.1)
Sodium: 139 mEq/L (ref 135–145)
Total Bilirubin: 0.6 mg/dL (ref 0.2–1.2)
Total Protein: 7.3 g/dL (ref 6.0–8.3)

## 2021-10-03 LAB — C-REACTIVE PROTEIN: CRP: 1.2 mg/dL (ref 0.5–20.0)

## 2021-10-03 LAB — CBC WITH DIFFERENTIAL/PLATELET
Basophils Absolute: 0 10*3/uL (ref 0.0–0.1)
Basophils Relative: 0.6 % (ref 0.0–3.0)
Eosinophils Absolute: 0.1 10*3/uL (ref 0.0–0.7)
Eosinophils Relative: 1.1 % (ref 0.0–5.0)
HCT: 44 % (ref 36.0–46.0)
Hemoglobin: 14.9 g/dL (ref 12.0–15.0)
Lymphocytes Relative: 24.4 % (ref 12.0–46.0)
Lymphs Abs: 1.9 10*3/uL (ref 0.7–4.0)
MCHC: 33.9 g/dL (ref 30.0–36.0)
MCV: 91.3 fl (ref 78.0–100.0)
Monocytes Absolute: 0.5 10*3/uL (ref 0.1–1.0)
Monocytes Relative: 6.3 % (ref 3.0–12.0)
Neutro Abs: 5.1 10*3/uL (ref 1.4–7.7)
Neutrophils Relative %: 67.6 % (ref 43.0–77.0)
Platelets: 313 10*3/uL (ref 150.0–400.0)
RBC: 4.82 Mil/uL (ref 3.87–5.11)
RDW: 13.1 % (ref 11.5–15.5)
WBC: 7.6 10*3/uL (ref 4.0–10.5)

## 2021-10-03 LAB — SEDIMENTATION RATE: Sed Rate: 20 mm/hr (ref 0–30)

## 2021-10-03 LAB — URIC ACID: Uric Acid, Serum: 7.5 mg/dL — ABNORMAL HIGH (ref 2.4–7.0)

## 2021-10-03 MED ORDER — MELOXICAM 15 MG PO TABS: 7.5000 mg | ORAL_TABLET | Freq: Every day | ORAL | 0 refills | Status: DC

## 2021-10-03 NOTE — Patient Instructions (Addendum)
Good to see you  ?Rheumatology lab work  ?- Start meloxicam 7.5 mg daily x2 weeks.  If still having pain after 2 weeks, complete 3rd-week of meloxicam. May use remaining meloxicam as needed once daily for pain control.  Do not to use additional NSAIDs while taking meloxicam.  May use Tylenol [phone] mg 2 to 3 times a day for breakthrough pain.  ?Start with 7.5 mg if you tolerate increase to 15 mg  ?3 week follow up  ? ?

## 2021-10-04 ENCOUNTER — Encounter: Payer: Self-pay | Admitting: Internal Medicine

## 2021-10-04 ENCOUNTER — Other Ambulatory Visit: Payer: Self-pay | Admitting: Emergency Medicine

## 2021-10-04 DIAGNOSIS — F32A Depression, unspecified: Secondary | ICD-10-CM | POA: Insufficient documentation

## 2021-10-04 DIAGNOSIS — M25562 Pain in left knee: Secondary | ICD-10-CM | POA: Insufficient documentation

## 2021-10-04 NOTE — Assessment & Plan Note (Signed)
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

## 2021-10-04 NOTE — Assessment & Plan Note (Signed)
For cologuard ?

## 2021-10-04 NOTE — Assessment & Plan Note (Signed)
Mild to mod, no SI or HI, for celexa 10 qd,  to f/u any worsening symptoms or concerns, declines referral for counseling or psychiatry ?

## 2021-10-04 NOTE — Assessment & Plan Note (Signed)
Direct LDL mg/dL 149.0  141.0   ? ?Stable, pt to continue current diet - declines statin ? ?

## 2021-10-04 NOTE — Assessment & Plan Note (Signed)
Lab Results  ?Component Value Date  ? HGBA1C 6.2 09/29/2021  ? ?Stable, pt to continue current medical treatment  - diet ? ?

## 2021-10-04 NOTE — Assessment & Plan Note (Signed)
Left > right - cont mobic prn ?

## 2021-10-04 NOTE — Assessment & Plan Note (Signed)
BP Readings from Last 3 Encounters:  ?10/03/21 136/78  ?09/29/21 130/80  ?09/21/21 134/74  ? ?Stable, pt to continue medical treatment losartan, hct ? ?

## 2021-10-04 NOTE — Assessment & Plan Note (Signed)
Last vitamin D ?Lab Results  ?Component Value Date  ? VD25OH 71.59 09/29/2021  ? ?Stable, cont oral replacement ? ?

## 2021-10-06 LAB — ANTI-NUCLEAR AB-TITER (ANA TITER): ANA Titer 1: 1:40 {titer} — ABNORMAL HIGH

## 2021-10-06 LAB — CYCLIC CITRUL PEPTIDE ANTIBODY, IGG: Cyclic Citrullin Peptide Ab: <16 UNITS

## 2021-10-06 LAB — RHEUMATOID FACTOR: Rheumatoid fact SerPl-aCnc: <14 IU/mL (ref <14)

## 2021-10-06 LAB — ANA: Anti Nuclear Antibody (ANA): POSITIVE — AB

## 2021-10-20 NOTE — Progress Notes (Signed)
? ? Frances Nelson D.Merril Abbe ?[facility] ?[place] ?Phone: [phone] ?  ?Assessment and Plan:   ?  ?1. Low back pain, unspecified back pain laterality, unspecified chronicity, unspecified whether sciatica present ?2. Positive ANA (antinuclear antibody) ?3. Primary osteoarthritis of both knees ?4. Primary osteoarthritis of both hands ?5. Primary osteoarthritis of both feet ?6. Left leg weakness ?7. DDD (degenerative disc disease), lumbar ?-Chronic with exacerbation, subsequent visit ?- Unclear etiology of multiple musculoskeletal complaints with chronically positive ANA and patient not following with rheumatology for the past 5+ years ?- Based on positive ANA with nuclear, homogeneous pattern, and multiple musculoskeletal complaints, recommend that patient reestablish with rheumatology to determine if ongoing treatment should be initiated ?- Based on worsening bilateral leg pain, decreased sensation and decreased strength in left leg, will further evaluate lumbar spine with MRI ?- X-ray obtained in clinic.  My interpretation: No acute fracture or vertebral collapse.  Grade 1 anterior listhesis L4 and L5 with degenerative changes most prominent at L5. ?- Discontinue daily use of meloxicam and may use remainder as needed ?- MR Lumbar Spine Wo Contrast; Future ?- Ambulatory referral to Rheumatology  ? ?  ?Pertinent previous records reviewed include none ?  ?Follow Up: After MRI to review results and discuss further treatment plan ?  ?Subjective:   ?I, Frances Nelson, am serving as a Education administrator for [employer] ? ?Chief Complaint: left sided pain  ? ?HPI:  ?10/03/2021 ?Patient is a 66 year old female complaining of left ankle, left leg, and low back pain. Patient states that her left sided pain started 2015 hasnt been getting any better , Achilles tendonitis bilateral left is more painful, left knee pain , fell down the stairs feb 7th and caught herself her  legs were underneath her in a dive position her knee had given out on her. Has picture of her ankle from her fall, low back pain is all the way across pain is mostly in the middle , has numbness and tingling her toes are always numb gets weird sensations , feels like ice cold water is dripping down the back of her leg, gets really sharp pains on her legs and feet, was taking ib for the swelling but has had reactions with it nausea, seems to work in reverse the ib makes the swelling worst, takes tylenol arthritis 650 mg that seems to help a little, when she takes she is able to sleep at night. Pt says she has a lump on her left heel and left knee and a cyst in the back of her knee ?  ?10/24/2021 ?Patient states that today her legs are killing her, had improvement until it got cold again and then the pain came back  ? ? ?  ?Relevant Historical Information: Positive ANA in 2019, but no recent follow-up with rheumatology, chronic pain in multiple locations, hypertension, COPD ? ?Additional pertinent review of systems negative. ? ? ?Current Outpatient Medications:  ?  acidophilus (RISAQUAD) CAPS capsule, Take 1 capsule by mouth daily., Disp: , Rfl:  ?  albuterol (PROVENTIL) (2.5 MG/3ML) 0.083% nebulizer solution, USE 1 VIAL IN NEBULIZER EVERY 6HRS FOR SHORTNESS OF BREATH/WHEEZING, Disp: 300 mL, Rfl: 5 ?  albuterol (VENTOLIN HFA) 108 (90 Base) MCG/ACT inhaler, INHALE 2 PUFFS BY MOUTH EVERY 6 HOURS AS NEEDED FOR WHEEZING/SHORTNESS OF BREATH, Disp: 18 each, Rfl: 11 ?  Calcium-Vitamins C & D (CALCIUM/C/D PO), Take by mouth., Disp: , Rfl:  ?  cetirizine (  ZYRTEC) 10 MG tablet, Take 10 mg by mouth daily., Disp: , Rfl:  ?  Cholecalciferol (HM VITAMIN D3) 100 MCG (4000 UT) CAPS, Take 4,000 Units by mouth daily., Disp: , Rfl:  ?  citalopram (CELEXA) 10 MG tablet, Take 1 tablet (10 mg total) by mouth daily., Disp: 90 tablet, Rfl: 3 ?  gabapentin (NEURONTIN) 100 MG capsule, TAKE 1 CAPSULE (100 MG TOTAL) BY MOUTH THREE TIMES DAILY.,  Disp: 270 capsule, Rfl: 1 ?  hydrochlorothiazide (HYDRODIURIL) 12.5 MG tablet, Take 12.5 mg by mouth daily., Disp: , Rfl:  ?  losartan (COZAAR) 25 MG tablet, TAKE 1 TABLET (25 MG TOTAL) BY MOUTH DAILY., Disp: 90 tablet, Rfl: 2 ?  meloxicam (MOBIC) 15 MG tablet, Take 0.5 tablets (7.5 mg total) by mouth daily., Disp: 60 tablet, Rfl: 0 ?  methocarbamol (ROBAXIN-750) 750 MG tablet, Take 1 tablet (750 mg total) by mouth every 6 (six) hours as needed for muscle spasms., Disp: 120 tablet, Rfl: 2 ?  mometasone (NASONEX) 50 MCG/ACT nasal spray, Place 2 sprays into the nose daily., Disp: , Rfl:  ?  montelukast (SINGULAIR) 10 MG tablet, TAKE 1 TABLET BY MOUTH EVERYDAY AT BEDTIME, Disp: 90 tablet, Rfl: 3 ?  Multiple Vitamins-Minerals (MULTIVITAMIN WITH MINERALS) tablet, Take 1 tablet by mouth daily., Disp: , Rfl:  ?  niacin 500 MG tablet, , Disp: , Rfl:  ?  nortriptyline (PAMELOR) 10 MG capsule, TAKE 2 CAPSULE BY MOUTH EVERYDAY AT BEDTIME, Disp: 180 capsule, Rfl: 1 ?  omeprazole (PRILOSEC) 40 MG capsule, TAKE 1 CAPSULE (40 MG TOTAL) BY MOUTH IN THE MORNING AND AT BEDTIME., Disp: 180 capsule, Rfl: 3  ? ?Objective:   ?  ?Vitals:  ? 10/24/21 1403  ?BP: 136/80  ?Pulse: (!) 58  ?SpO2: 97%  ?Height: '5\' 4"'$  (1.626 m)  ?  ?  ?Body mass index is 37.42 kg/m?.  ?  ?Physical Exam:   ? ?Gen: Appears well, nad, nontoxic and pleasant ?Psych: Alert and oriented, appropriate mood and affect ?Neuro: Decreased sensation over medial and lateral left leg distal to knee when compared to right leg, otherwise sensation intact.  Knee flexion, knee extension, hip extension, hip flexion 4/5 in left leg compared to right, otherwise strength is 5/5 in upper and lower extremities, muscle tone wnl ?Skin: no susupicious lesions or rashes ? ?Back - Normal skin, Spine with normal alignment and no deformity.   ?No tenderness to vertebral process palpation.   ?Paraspinous muscles are mildly tender and without spasm ?  ? ? ?Electronically signed by:  ?Frances Nelson  D.Merril Abbe ?[facility] ?4:06 PM 10/24/21 ?

## 2021-10-21 ENCOUNTER — Other Ambulatory Visit: Payer: Self-pay | Admitting: Primary Care

## 2021-10-24 ENCOUNTER — Ambulatory Visit (INDEPENDENT_AMBULATORY_CARE_PROVIDER_SITE_OTHER): Payer: Medicare Other | Admitting: Sports Medicine

## 2021-10-24 ENCOUNTER — Ambulatory Visit (INDEPENDENT_AMBULATORY_CARE_PROVIDER_SITE_OTHER): Payer: Medicare Other

## 2021-10-24 VITALS — BP 136/80 | HR 58 | Ht 64.0 in

## 2021-10-24 DIAGNOSIS — M5136 Other intervertebral disc degeneration, lumbar region: Secondary | ICD-10-CM

## 2021-10-24 DIAGNOSIS — M19071 Primary osteoarthritis, right ankle and foot: Secondary | ICD-10-CM

## 2021-10-24 DIAGNOSIS — M170 Bilateral primary osteoarthritis of knee: Secondary | ICD-10-CM | POA: Diagnosis not present

## 2021-10-24 DIAGNOSIS — R768 Other specified abnormal immunological findings in serum: Secondary | ICD-10-CM

## 2021-10-24 DIAGNOSIS — M19072 Primary osteoarthritis, left ankle and foot: Secondary | ICD-10-CM

## 2021-10-24 DIAGNOSIS — M545 Low back pain, unspecified: Secondary | ICD-10-CM

## 2021-10-24 DIAGNOSIS — R29898 Other symptoms and signs involving the musculoskeletal system: Secondary | ICD-10-CM

## 2021-10-24 DIAGNOSIS — M19041 Primary osteoarthritis, right hand: Secondary | ICD-10-CM | POA: Diagnosis not present

## 2021-10-24 DIAGNOSIS — M19042 Primary osteoarthritis, left hand: Secondary | ICD-10-CM

## 2021-10-24 NOTE — Patient Instructions (Addendum)
Good to see you  ?Referral to rheumatology  ?Referral  MRI lumbar spine  ?Use remaining meloxicam as needed  ?Follow up 3 days after MRI ?

## 2021-10-25 ENCOUNTER — Ambulatory Visit (INDEPENDENT_AMBULATORY_CARE_PROVIDER_SITE_OTHER): Payer: Medicare Other

## 2021-10-25 DIAGNOSIS — M19072 Primary osteoarthritis, left ankle and foot: Secondary | ICD-10-CM

## 2021-10-25 DIAGNOSIS — M5136 Other intervertebral disc degeneration, lumbar region: Secondary | ICD-10-CM

## 2021-10-25 DIAGNOSIS — M19041 Primary osteoarthritis, right hand: Secondary | ICD-10-CM | POA: Diagnosis not present

## 2021-10-25 DIAGNOSIS — M19071 Primary osteoarthritis, right ankle and foot: Secondary | ICD-10-CM

## 2021-10-25 DIAGNOSIS — R768 Other specified abnormal immunological findings in serum: Secondary | ICD-10-CM | POA: Diagnosis not present

## 2021-10-25 DIAGNOSIS — R29898 Other symptoms and signs involving the musculoskeletal system: Secondary | ICD-10-CM

## 2021-10-25 DIAGNOSIS — M170 Bilateral primary osteoarthritis of knee: Secondary | ICD-10-CM | POA: Diagnosis not present

## 2021-10-25 DIAGNOSIS — M19042 Primary osteoarthritis, left hand: Secondary | ICD-10-CM

## 2021-10-25 DIAGNOSIS — M545 Low back pain, unspecified: Secondary | ICD-10-CM

## 2021-10-25 LAB — COLOGUARD

## 2021-10-27 NOTE — Progress Notes (Signed)
? ? Frances Nelson ?[facility] ?[place] ?Phone: [phone] ?  ?Assessment and Plan:   ? ?1. Low back pain, unspecified back pain laterality, unspecified chronicity, unspecified whether sciatica present ?2. Left leg weakness ?3. DDD (degenerative disc disease), lumbar ?4. Spinal stenosis of lumbar region with neurogenic claudication ?-Chronic with exacerbation, subsequent visit ?- Continued low back pain with radicular symptoms which include left leg weakness ?- Reviewed MRI with patient in room showing areas of spinal stenosis most severe at L4-L5 with L4-5 grade 1 anterolisthesis ?- We will proceed with left-sided L4-5 epidural and follow-up with patient 2 weeks after to review symptoms.  If no improvement or worsening of symptoms, would consider neurosurgery referral at that time ?  ?Pertinent previous records reviewed include lumbar MRI 10/25/21 ?  ?Follow Up: - We will proceed with left-sided L4-5 epidural and follow-up with patient 2 weeks after to review symptoms.  If no improvement or worsening of symptoms, would consider neurosurgery referral at that time  ? ?  ?Subjective:   ?I, Pincus Badder, am serving as a Education administrator for [employer] ?  ?Chief Complaint: left sided pain  ?  ?HPI:  ?10/03/2021 ?Patient is a 66 year old female complaining of left ankle, left leg, and low back pain. Patient states that her left sided pain started 2015 hasnt been getting any better , Achilles tendonitis bilateral left is more painful, left knee pain , fell down the stairs feb 7th and caught herself her legs were underneath her in a dive position her knee had given out on her. Has picture of her ankle from her fall, low back pain is all the way across pain is mostly in the middle , has numbness and tingling her toes are always numb gets weird sensations , feels like ice cold water is dripping down the back of her leg, gets really sharp pains on her legs  and feet, was taking ib for the swelling but has had reactions with it nausea, seems to work in reverse the ib makes the swelling worst, takes tylenol arthritis 650 mg that seems to help a little, when she takes she is able to sleep at night. Pt says she has a lump on her left heel and left knee and a cyst in the back of her knee ?  ?10/24/2021 ?Patient states that today her legs are killing her, had improvement until it got cold again and then the pain came back  ?  ?10/28/2021 ?Patient states that she still hs pain in her legs and feet, toes are still numb, beginning of march she fell down the stairs and her left foot 3 great toe down are still bruised look like blood blisters , still getting sharp pains  ?  ?Relevant Historical Information: Positive ANA in 2019, but no recent follow-up with rheumatology, chronic pain in multiple locations, hypertension, COPD ? ?Additional pertinent review of systems negative. ? ? ?Current Outpatient Medications:  ?  acidophilus (RISAQUAD) CAPS capsule, Take 1 capsule by mouth daily., Disp: , Rfl:  ?  albuterol (PROVENTIL) (2.5 MG/3ML) 0.083% nebulizer solution, USE 1 VIAL IN NEBULIZER EVERY 6HRS FOR SHORTNESS OF BREATH/WHEEZING, Disp: 300 mL, Rfl: 5 ?  albuterol (VENTOLIN HFA) 108 (90 Base) MCG/ACT inhaler, INHALE 2 PUFFS BY MOUTH EVERY 6 HOURS AS NEEDED FOR WHEEZING/SHORTNESS OF BREATH, Disp: 18 each, Rfl: 11 ?  Calcium-Vitamins C & D (CALCIUM/C/D PO), Take by mouth., Disp: , Rfl:  ?  cetirizine (ZYRTEC) 10 MG tablet, Take 10 mg by mouth daily., Disp: , Rfl:  ?  Cholecalciferol (HM VITAMIN D3) 100 MCG (4000 UT) CAPS, Take 4,000 Units by mouth daily., Disp: , Rfl:  ?  citalopram (CELEXA) 10 MG tablet, Take 1 tablet (10 mg total) by mouth daily., Disp: 90 tablet, Rfl: 3 ?  gabapentin (NEURONTIN) 100 MG capsule, TAKE 1 CAPSULE (100 MG TOTAL) BY MOUTH THREE TIMES DAILY., Disp: 270 capsule, Rfl: 1 ?  hydrochlorothiazide (HYDRODIURIL) 12.5 MG tablet, Take 12.5 mg by mouth daily., Disp:  , Rfl:  ?  losartan (COZAAR) 25 MG tablet, TAKE 1 TABLET (25 MG TOTAL) BY MOUTH DAILY., Disp: 90 tablet, Rfl: 2 ?  meloxicam (MOBIC) 15 MG tablet, Take 0.5 tablets (7.5 mg total) by mouth daily., Disp: 60 tablet, Rfl: 0 ?  methocarbamol (ROBAXIN-750) 750 MG tablet, Take 1 tablet (750 mg total) by mouth every 6 (six) hours as needed for muscle spasms., Disp: 120 tablet, Rfl: 2 ?  mometasone (NASONEX) 50 MCG/ACT nasal spray, Place 2 sprays into the nose daily., Disp: , Rfl:  ?  montelukast (SINGULAIR) 10 MG tablet, TAKE 1 TABLET BY MOUTH EVERYDAY AT BEDTIME, Disp: 90 tablet, Rfl: 3 ?  Multiple Vitamins-Minerals (MULTIVITAMIN WITH MINERALS) tablet, Take 1 tablet by mouth daily., Disp: , Rfl:  ?  niacin 500 MG tablet, , Disp: , Rfl:  ?  nortriptyline (PAMELOR) 10 MG capsule, TAKE 2 CAPSULE BY MOUTH EVERYDAY AT BEDTIME, Disp: 180 capsule, Rfl: 1 ?  omeprazole (PRILOSEC) 40 MG capsule, TAKE 1 CAPSULE (40 MG TOTAL) BY MOUTH IN THE MORNING AND AT BEDTIME., Disp: 180 capsule, Rfl: 3  ? ?Objective:   ?  ?Vitals:  ? 10/28/21 1409  ?BP: 136/74  ?Pulse: 80  ?SpO2: 96%  ?Weight: 211 lb (95.7 kg)  ?Height: '5\' 4"'$  (1.626 m)  ?  ?  ?Body mass index is 36.22 kg/m?.  ?  ?Physical Exam:   ? ?Gen: Appears well, nad, nontoxic and pleasant ?Psych: Alert and oriented, appropriate mood and affect ?Neuro: Decreased sensation over medial and lateral left leg distal to knee when compared to right leg, otherwise sensation intact.  Knee flexion, knee extension, hip extension, hip flexion 4/5 in left leg compared to right, otherwise strength is 5/5 in upper and lower extremities, muscle tone wnl ?Skin: no susupicious lesions or rashes ?  ?Back - Normal skin, Spine with normal alignment and no deformity.   ?No tenderness to vertebral process palpation.   ?Paraspinous muscles are mildly tender and without spasm ? ? ?Electronically signed by:  ?Frances Nelson ?[facility] ?2:49 PM 10/28/21 ?

## 2021-10-28 ENCOUNTER — Ambulatory Visit (INDEPENDENT_AMBULATORY_CARE_PROVIDER_SITE_OTHER): Payer: Medicare Other | Admitting: Sports Medicine

## 2021-10-28 VITALS — BP 136/74 | HR 80 | Ht 64.0 in | Wt 211.0 lb

## 2021-10-28 DIAGNOSIS — M48062 Spinal stenosis, lumbar region with neurogenic claudication: Secondary | ICD-10-CM | POA: Insufficient documentation

## 2021-10-28 DIAGNOSIS — M545 Low back pain, unspecified: Secondary | ICD-10-CM | POA: Diagnosis not present

## 2021-10-28 DIAGNOSIS — R29898 Other symptoms and signs involving the musculoskeletal system: Secondary | ICD-10-CM

## 2021-10-28 DIAGNOSIS — M5136 Other intervertebral disc degeneration, lumbar region: Secondary | ICD-10-CM

## 2021-10-28 NOTE — Patient Instructions (Addendum)
Good to see you  ?Referral for epidural injection L4-L5 left  ?Follow up with [place] 2 weeks after your injection to discuss results ?

## 2021-11-02 ENCOUNTER — Ambulatory Visit
Admission: RE | Admit: 2021-11-02 | Discharge: 2021-11-02 | Disposition: A | Discharge status: Home / Self Care | Payer: Medicare Other | Source: Ambulatory Visit | Attending: Sports Medicine | Admitting: Sports Medicine

## 2021-11-02 DIAGNOSIS — M5136 Other intervertebral disc degeneration, lumbar region: Secondary | ICD-10-CM

## 2021-11-02 DIAGNOSIS — M545 Low back pain, unspecified: Secondary | ICD-10-CM

## 2021-11-02 DIAGNOSIS — R29898 Other symptoms and signs involving the musculoskeletal system: Secondary | ICD-10-CM

## 2021-11-02 MED ORDER — METHYLPREDNISOLONE ACETATE 40 MG/ML INJ SUSP (RADIOLOG
80.0000 mg | Freq: Once | INTRAMUSCULAR | Status: AC
  Administered 2021-11-02: 80 mg via EPIDURAL

## 2021-11-02 MED ORDER — IOPAMIDOL (ISOVUE-M 200) INJECTION 41%
1.0000 mL | Freq: Once | INTRAMUSCULAR | Status: AC
  Administered 2021-11-02: 1 mL via EPIDURAL

## 2021-11-02 NOTE — Discharge Instructions (Signed)

## 2021-11-15 NOTE — Progress Notes (Addendum)
Patient's             ? ? Benito Mccreedy D.Merril Abbe ?[facility] ?[place] ?Phone: [phone] ?  ?Assessment and Plan:   ?  ?1. Spinal stenosis of lumbar region with neurogenic claudication ?2. Chronic bilateral low back pain with left-sided sciatica ?3. Left leg weakness ?4. DDD (degenerative disc disease), lumbar ?5.  Polyarthralgia ?-Chronic exacerbation, subsequent visit ?- Mild improvement in low back pain and left-sided numbness and tingling since lumbar epidural injection on 11/02/2021.  Patient stated that it feels like feeling is coming back into her leg, though this feeling is uncomfortable and painful at times ?- Due to mild improvement, though incomplete improvement in patient's symptoms after epidural, we will repeat injection to see if it has a additive effect on patient's symptoms ?- Patient continues to complain of bilateral aches and pains in hips, knees, feet, ankles, and new complaints of shoulders, arms, elbows.  With chronic pain of multiple sites, patient appears to fit a fibromyalgia presentation.  She has trialed duloxetine on 2 different occasions, but discontinued both times due to mild side effects.  Patient agreeable to trying medication again at this time ?- Start duloxetine 30 mg daily ? ?- Of note, she reached out informing us of contraindication of using Cymbalta and nortriptyline at the same time.  Patient stated that she is not taking nortriptyline at this time. ? ?Pertinent previous records reviewed include epidural injection on 11/02/2021 ?  ?Follow Up: 2 weeks after epidural to review symptoms and to discuss if duloxetine has been beneficial.  Ultimately patient may benefit from pain management referral ?  ?Subjective:   ?I, Vilma Meckel, am serving as a Education administrator for Dr. Glennon Mac. ? ?  ?Chief Complaint: left sided pain  ?  ?HPI:  ?10/03/2021 ?Patient is a 66 year old female complaining of left ankle, left leg, and low back pain. Patient  states that her left sided pain started 2015 hasnt been getting any better , Achilles tendonitis bilateral left is more painful, left knee pain , fell down the stairs feb 7th and caught herself her legs were underneath her in a dive position her knee had given out on her. Has picture of her ankle from her fall, low back pain is all the way across pain is mostly in the middle , has numbness and tingling her toes are always numb gets weird sensations , feels like ice cold water is dripping down the back of her leg, gets really sharp pains on her legs and feet, was taking ib for the swelling but has had reactions with it nausea, seems to work in reverse the ib makes the swelling worst, takes tylenol arthritis 650 mg that seems to help a little, when she takes she is able to sleep at night. Pt says she has a lump on her left heel and left knee and a cyst in the back of her knee ?  ?10/24/2021 ?Patient states that today her legs are killing her, had improvement until it got cold again and then the pain came back  ?  ?10/28/2021 ?Patient states that she still hs pain in her legs and feet, toes are still numb, beginning of march she fell down the stairs and her left foot 3 great toe down are still bruised look like blood blisters , still getting sharp pains  ? ?11/17/2021 ?Patient states had epidural and some of the swelling is gone and the numbness in  legs are gone but the pain is now more intense. Pain in the shoulders radiating down in both arms to the elbows. These pains have started only since the epidural. Also has had a headache and can get more intense. Sometimes also will get sporadic pain in foot that causes a reflex reaction. ? ?  ?Relevant Historical Information: Positive ANA in 2019, but no recent follow-up with rheumatology, chronic pain in multiple locations, hypertension, COPD ? ?Additional pertinent review of systems negative. ? ? ?Current Outpatient Medications:  ?  DULoxetine (CYMBALTA) 30 MG capsule, Take 1  capsule (30 mg total) by mouth daily., Disp: 30 capsule, Rfl: 0 ?  acidophilus (RISAQUAD) CAPS capsule, Take 1 capsule by mouth daily., Disp: , Rfl:  ?  albuterol (PROVENTIL) (2.5 MG/3ML) 0.083% nebulizer solution, USE 1 VIAL IN NEBULIZER EVERY 6HRS FOR SHORTNESS OF BREATH/WHEEZING, Disp: 300 mL, Rfl: 5 ?  albuterol (VENTOLIN HFA) 108 (90 Base) MCG/ACT inhaler, INHALE 2 PUFFS BY MOUTH EVERY 6 HOURS AS NEEDED FOR WHEEZING/SHORTNESS OF BREATH, Disp: 18 each, Rfl: 11 ?  Calcium-Vitamins C & D (CALCIUM/C/D PO), Take by mouth., Disp: , Rfl:  ?  cetirizine (ZYRTEC) 10 MG tablet, Take 10 mg by mouth daily., Disp: , Rfl:  ?  Cholecalciferol (HM VITAMIN D3) 100 MCG (4000 UT) CAPS, Take 4,000 Units by mouth daily., Disp: , Rfl:  ?  citalopram (CELEXA) 10 MG tablet, Take 1 tablet (10 mg total) by mouth daily., Disp: 90 tablet, Rfl: 3 ?  gabapentin (NEURONTIN) 100 MG capsule, TAKE 1 CAPSULE (100 MG TOTAL) BY MOUTH THREE TIMES DAILY., Disp: 270 capsule, Rfl: 1 ?  hydrochlorothiazide (HYDRODIURIL) 12.5 MG tablet, Take 12.5 mg by mouth daily., Disp: , Rfl:  ?  losartan (COZAAR) 25 MG tablet, TAKE 1 TABLET (25 MG TOTAL) BY MOUTH DAILY., Disp: 90 tablet, Rfl: 2 ?  meloxicam (MOBIC) 15 MG tablet, Take 0.5 tablets (7.5 mg total) by mouth daily., Disp: 60 tablet, Rfl: 0 ?  methocarbamol (ROBAXIN-750) 750 MG tablet, Take 1 tablet (750 mg total) by mouth every 6 (six) hours as needed for muscle spasms., Disp: 120 tablet, Rfl: 2 ?  mometasone (NASONEX) 50 MCG/ACT nasal spray, Place 2 sprays into the nose daily., Disp: , Rfl:  ?  montelukast (SINGULAIR) 10 MG tablet, TAKE 1 TABLET BY MOUTH EVERYDAY AT BEDTIME, Disp: 90 tablet, Rfl: 3 ?  Multiple Vitamins-Minerals (MULTIVITAMIN WITH MINERALS) tablet, Take 1 tablet by mouth daily., Disp: , Rfl:  ?  niacin 500 MG tablet, , Disp: , Rfl:  ?  nortriptyline (PAMELOR) 10 MG capsule, TAKE 2 CAPSULE BY MOUTH EVERYDAY AT BEDTIME, Disp: 180 capsule, Rfl: 1 ?  omeprazole (PRILOSEC) 40 MG capsule, TAKE  1 CAPSULE (40 MG TOTAL) BY MOUTH IN THE MORNING AND AT BEDTIME., Disp: 180 capsule, Rfl: 3  ? ?Objective:   ?  ?Vitals:  ? 11/17/21 1115  ?BP: 134/80  ?Pulse: 83  ?SpO2: 96%  ?Weight: 209 lb (94.8 kg)  ?Height: '5\' 4"'$  (1.626 m)  ?  ?  ?Body mass index is 35.87 kg/m?.  ?  ?Physical Exam:   ? ?Gen: Appears well, nad, nontoxic and pleasant ?Psych: Alert and oriented, appropriate mood and affect ?Neuro: Decreased sensation over medial and lateral left leg distal to knee when compared to right leg, otherwise sensation intact.  Knee flexion, knee extension, hip extension, hip flexion 4+/5 in left leg compared to right, otherwise strength is 5/5 in upper and lower extremities, muscle tone wnl ?Skin: no susupicious lesions  or rashes ?  ?Back - Normal skin, Spine with normal alignment and no deformity.   ?No tenderness to vertebral process palpation.   ?Paraspinous muscles are mildly tender and without spasm ? ? ?Electronically signed by:  ?Benito Mccreedy D.Merril Abbe ?[facility] ?12:35 PM 11/17/21 ?

## 2021-11-17 ENCOUNTER — Ambulatory Visit (INDEPENDENT_AMBULATORY_CARE_PROVIDER_SITE_OTHER): Payer: Medicare Other | Admitting: Sports Medicine

## 2021-11-17 VITALS — BP 134/80 | HR 83 | Ht 64.0 in | Wt 209.0 lb

## 2021-11-17 DIAGNOSIS — M51369 Other intervertebral disc degeneration, lumbar region without mention of lumbar back pain or lower extremity pain: Secondary | ICD-10-CM

## 2021-11-17 DIAGNOSIS — M5136 Other intervertebral disc degeneration, lumbar region: Secondary | ICD-10-CM | POA: Diagnosis not present

## 2021-11-17 DIAGNOSIS — R29898 Other symptoms and signs involving the musculoskeletal system: Secondary | ICD-10-CM

## 2021-11-17 DIAGNOSIS — M48062 Spinal stenosis, lumbar region with neurogenic claudication: Secondary | ICD-10-CM | POA: Diagnosis not present

## 2021-11-17 DIAGNOSIS — G8929 Other chronic pain: Secondary | ICD-10-CM

## 2021-11-17 DIAGNOSIS — M5442 Lumbago with sciatica, left side: Secondary | ICD-10-CM | POA: Diagnosis not present

## 2021-11-17 DIAGNOSIS — M2550 Pain in unspecified joint: Secondary | ICD-10-CM

## 2021-11-17 MED ORDER — DULOXETINE HCL 30 MG PO CPEP: 30.0000 mg | ORAL_CAPSULE | Freq: Every day | ORAL | 0 refills | Status: DC

## 2021-11-17 NOTE — Addendum Note (Signed)
Addended by: Glennon Mac on: 11/17/2021 01:38 PM ? ? Modules accepted: Orders ? ?

## 2021-11-17 NOTE — Patient Instructions (Addendum)
Epidural ordered ?Cymbalta '30mg'$  prescribed ?See you again 2 weeks after epidural ?

## 2021-11-21 ENCOUNTER — Telehealth: Payer: Self-pay | Admitting: Sports Medicine

## 2021-11-21 ENCOUNTER — Other Ambulatory Visit: Payer: Self-pay

## 2021-11-21 NOTE — Telephone Encounter (Signed)
Pt was called and spoke abut each med on her list she isn't taking HCL or nortriptyline  ?

## 2021-11-24 LAB — COLOGUARD: COLOGUARD: NEGATIVE

## 2021-11-25 ENCOUNTER — Ambulatory Visit
Admission: RE | Admit: 2021-11-25 | Discharge: 2021-11-25 | Disposition: A | Discharge status: Home / Self Care | Payer: Medicare Other | Source: Ambulatory Visit | Attending: Sports Medicine | Admitting: Sports Medicine

## 2021-11-25 DIAGNOSIS — M48062 Spinal stenosis, lumbar region with neurogenic claudication: Secondary | ICD-10-CM

## 2021-11-25 MED ORDER — METHYLPREDNISOLONE ACETATE 40 MG/ML INJ SUSP (RADIOLOG
80.0000 mg | Freq: Once | INTRAMUSCULAR | Status: AC
  Administered 2021-11-25: 80 mg via EPIDURAL

## 2021-11-25 MED ORDER — IOPAMIDOL (ISOVUE-M 200) INJECTION 41%
1.0000 mL | Freq: Once | INTRAMUSCULAR | Status: AC
  Administered 2021-11-25: 1 mL via EPIDURAL

## 2021-11-25 NOTE — Discharge Instructions (Signed)

## 2021-11-28 ENCOUNTER — Encounter: Payer: Self-pay | Admitting: Internal Medicine

## 2021-11-29 MED ORDER — HYDROCHLOROTHIAZIDE 12.5 MG PO TABS: 12.5000 mg | ORAL_TABLET | Freq: Every day | ORAL | 2 refills | Status: DC

## 2021-12-06 NOTE — Progress Notes (Signed)
Benito Mccreedy D.[facility] [place] Phone: [phone]   Assessment and Plan:     1. Spinal stenosis of lumbar region with neurogenic claudication 2. Chronic bilateral low back pain with left-sided sciatica 3. Numbness of feet -Chronic with exacerbation, subsequent visit - Moderate improvement in acute flare of chronic low back pain after second epidural.  First L4-L5 epidural on 11/02/2021 with mild improvement, and repeated on 11/25/2021 with moderate to significant improvement that has lasted through today's office visit - Patient still experiencing bilateral numbness and tingling in feet, however this is decreased from numbness from mid shin distal to currently being ankle distal.  I suspect that this is likely from lumbar spinal stenosis.  Patient's A1c 6.2 in 09/2021 and patient in a prediabetic range, so DM is a less likely cause -Start HEP for low back and core - May use Tylenol for day-to-day pain relief  Pertinent previous records reviewed include A1c 09/29/2021, BMP 09/29/2021   Follow Up: As needed if no improvement or worsening of symptoms.  Hope to wait at least 3 months in between injections, so could repeat after 02/25/2022   Subjective:    I, [name], am serving as a Education administrator for Doctor Glennon Mac  Chief Complaint: left sided pain    HPI:  10/03/2021 Patient is a 66 year old female complaining of left ankle, left leg, and low back pain. Patient states that her left sided pain started 2015 hasnt been getting any better , Achilles tendonitis bilateral left is more painful, left knee pain , fell down the stairs feb 7th and caught herself her legs were underneath her in a dive position her knee had given out on her. Has picture of her ankle from her fall, low back pain is all the way across pain is mostly in the middle , has numbness and tingling her toes are always numb gets weird sensations , feels like  ice cold water is dripping down the back of her leg, gets really sharp pains on her legs and feet, was taking ib for the swelling but has had reactions with it nausea, seems to work in reverse the ib makes the swelling worst, takes tylenol arthritis 650 mg that seems to help a little, when she takes she is able to sleep at night. Pt says she has a lump on her left heel and left knee and a cyst in the back of her knee   10/24/2021 Patient states that today her legs are killing her, had improvement until it got cold again and then the pain came back    10/28/2021 Patient states that she still hs pain in her legs and feet, toes are still numb, beginning of march she fell down the stairs and her left foot 3 great toe down are still bruised look like blood blisters , still getting sharp pains    11/17/2021 Patient states had epidural and some of the swelling is gone and the numbness in legs are gone but the pain is now more intense. Pain in the shoulders radiating down in both arms to the elbows. These pains have started only since the epidural. Also has had a headache and can get more intense. Sometimes also will get sporadic pain in foot that causes a reflex reaction.  12/09/2021 Patient states that she is walking without her cane the pain is better,  feet and ankle still bother her thinks it more from arthritis,  isn't  having the leg pain, was able to lay on the ground for the first time in 6 years, was able to raise single leg got pain when she tried to raise both legs      Relevant Historical Information: Positive ANA in 2019, but no recent follow-up with rheumatology, chronic pain in multiple locations, hypertension, COPD  Additional pertinent review of systems negative.   Current Outpatient Medications:    acidophilus (RISAQUAD) CAPS capsule, Take 1 capsule by mouth daily., Disp: , Rfl:    albuterol (PROVENTIL) (2.5 MG/3ML) 0.083% nebulizer solution, USE 1 VIAL IN NEBULIZER EVERY 6HRS FOR  SHORTNESS OF BREATH/WHEEZING, Disp: 300 mL, Rfl: 5   albuterol (VENTOLIN HFA) 108 (90 Base) MCG/ACT inhaler, INHALE 2 PUFFS BY MOUTH EVERY 6 HOURS AS NEEDED FOR WHEEZING/SHORTNESS OF BREATH, Disp: 18 each, Rfl: 11   Calcium-Vitamins C & D (CALCIUM/C/D PO), Take by mouth., Disp: , Rfl:    cetirizine (ZYRTEC) 10 MG tablet, Take 10 mg by mouth daily., Disp: , Rfl:    Cholecalciferol (HM VITAMIN D3) 100 MCG (4000 UT) CAPS, Take 4,000 Units by mouth daily., Disp: , Rfl:    citalopram (CELEXA) 10 MG tablet, Take 1 tablet (10 mg total) by mouth daily., Disp: 90 tablet, Rfl: 3   DULoxetine (CYMBALTA) 30 MG capsule, Take 1 capsule (30 mg total) by mouth daily., Disp: 30 capsule, Rfl: 0   gabapentin (NEURONTIN) 100 MG capsule, TAKE 1 CAPSULE (100 MG TOTAL) BY MOUTH THREE TIMES DAILY., Disp: 270 capsule, Rfl: 1   hydrochlorothiazide (HYDRODIURIL) 12.5 MG tablet, Take 1 tablet (12.5 mg total) by mouth daily., Disp: 90 tablet, Rfl: 2   losartan (COZAAR) 25 MG tablet, TAKE 1 TABLET (25 MG TOTAL) BY MOUTH DAILY., Disp: 90 tablet, Rfl: 2   meloxicam (MOBIC) 15 MG tablet, Take 0.5 tablets (7.5 mg total) by mouth daily., Disp: 60 tablet, Rfl: 0   methocarbamol (ROBAXIN-750) 750 MG tablet, Take 1 tablet (750 mg total) by mouth every 6 (six) hours as needed for muscle spasms., Disp: 120 tablet, Rfl: 2   mometasone (NASONEX) 50 MCG/ACT nasal spray, Place 2 sprays into the nose daily., Disp: , Rfl:    montelukast (SINGULAIR) 10 MG tablet, TAKE 1 TABLET BY MOUTH EVERYDAY AT BEDTIME, Disp: 90 tablet, Rfl: 3   Multiple Vitamins-Minerals (MULTIVITAMIN WITH MINERALS) tablet, Take 1 tablet by mouth daily., Disp: , Rfl:    omeprazole (PRILOSEC) 40 MG capsule, TAKE 1 CAPSULE (40 MG TOTAL) BY MOUTH IN THE MORNING AND AT BEDTIME., Disp: 180 capsule, Rfl: 3   Objective:     Vitals:   12/09/21 1111  BP: 130/78  Pulse: 70  SpO2: 98%  Weight: 205 lb (93 kg)  Height: '5\' 4"'$  (1.626 m)      Body mass index is 35.19 kg/m.     Physical Exam:     Gen: Appears well, nad, nontoxic and pleasant Psych: Alert and oriented, appropriate mood and affect Neuro: Decreased sensation bilateral feet distal to ankle, otherwise sensation intact.  Knee flexion, knee extension, hip extension, hip flexion 5/5 in left leg compared to right, strength is 5/5 in upper and lower extremities, muscle tone wnl Skin: no susupicious lesions or rashes   Back - Normal skin, Spine with normal alignment and no deformity.   No tenderness to vertebral process palpation.   Paraspinous muscles are mildly tender and without spasm   Electronically signed by:  Benito Mccreedy D.[facility] 11:37 AM 12/09/21

## 2021-12-09 ENCOUNTER — Ambulatory Visit (INDEPENDENT_AMBULATORY_CARE_PROVIDER_SITE_OTHER): Payer: Medicare Other | Admitting: Sports Medicine

## 2021-12-09 VITALS — BP 130/78 | HR 70 | Ht 64.0 in | Wt 205.0 lb

## 2021-12-09 DIAGNOSIS — M48062 Spinal stenosis, lumbar region with neurogenic claudication: Secondary | ICD-10-CM | POA: Diagnosis not present

## 2021-12-09 DIAGNOSIS — G8929 Other chronic pain: Secondary | ICD-10-CM | POA: Diagnosis not present

## 2021-12-09 DIAGNOSIS — R200 Anesthesia of skin: Secondary | ICD-10-CM | POA: Diagnosis not present

## 2021-12-09 DIAGNOSIS — M5442 Lumbago with sciatica, left side: Secondary | ICD-10-CM | POA: Diagnosis not present

## 2021-12-09 NOTE — Patient Instructions (Addendum)
Good to see you  Core and low back HEP As needed follow up

## 2021-12-14 ENCOUNTER — Other Ambulatory Visit: Payer: Self-pay | Admitting: Sports Medicine

## 2022-01-12 ENCOUNTER — Other Ambulatory Visit: Payer: Self-pay | Admitting: Emergency Medicine

## 2022-01-23 ENCOUNTER — Other Ambulatory Visit: Payer: Self-pay | Admitting: Sports Medicine

## 2022-01-30 ENCOUNTER — Other Ambulatory Visit: Payer: Self-pay | Admitting: Internal Medicine

## 2022-01-31 ENCOUNTER — Telehealth: Payer: Self-pay | Admitting: Internal Medicine

## 2022-01-31 MED ORDER — LOSARTAN POTASSIUM 25 MG PO TABS: 25.0000 mg | ORAL_TABLET | Freq: Every day | ORAL | 1 refills | Status: DC

## 2022-01-31 NOTE — Telephone Encounter (Signed)
Caller & Relationship to patient: Tomeko Scoville  Call back number: [phone]  Date of last office visit: 09/29/21  Date of next office visit: 04/06/22  Medication(s) to be refilled:  losartan (COZAAR) 25 MG tablet   Preferred Pharmacy:  [id number] [place] Phone:  [phone] Fax:  [phone]

## 2022-03-21 NOTE — Progress Notes (Unsigned)
Office Visit Note  Patient: Frances Nelson             Date of Birth: March 19, 1956           MRN: [id number]             PCP: Biagio Borg, MD Referring: Glennon Mac, DO Visit Date: 03/22/2022 Occupation: '@GUAROCC' @  Subjective:  Pain in multiple joints  History of Present Illness: Frances Nelson is a 66 y.o. female with history of positive ANA and joint pain.  She returns today after her last visit on April 24, 2018.  At that time she was diagnosed with osteoarthritis involving her hands, knee joints and her feet.  She also had left Achilles tendinitis for which she was seeing an orthopedic surgeon.  She has known history of degenerative disease of cervical and lumbar spine.  She was evaluated for positive ANA and Raynaud's phenomenon.  Her ANA was positive and the entire ENA panel and anticardiolipin, beta-2 GP 1 antibodies were negative.  Rheumatoid factors and anti-CCP were negative.  Raynauds appeared to be related to smoking.  Patient states that she continues to have pain and discomfort in all of her joints.  She complains of increased numbness in her bilateral feet which is constant.  She also experiences pain in her bilateral feet.  She has been having increased pain and discomfort in her knee joints and she states her knees and knee joints give out on her.  She had a fall in February 2023.  She has been experiencing increased lower back pain.  She underwent epidural injections x2 which did not give her much relief.  She states the back pain radiates into her bilateral lower extremities.  She also has pain in her neck and shoulder region.  Activities of Daily Living:  Patient reports morning stiffness for all day. Patient Reports nocturnal pain.  Difficulty dressing/grooming: Reports Difficulty climbing stairs: Reports Difficulty getting out of chair: Reports Difficulty using hands for taps, buttons, cutlery, and/or writing: Reports  Review of Systems  Constitutional:  Positive  for fatigue.  HENT:  Positive for mouth dryness. Negative for mouth sores.   Eyes:  Positive for dryness.  Respiratory:  Positive for shortness of breath.   Cardiovascular:  Negative for chest pain and palpitations.  Gastrointestinal:  Negative for blood in stool, constipation and diarrhea.  Endocrine: Positive for increased urination.  Genitourinary:  Positive for involuntary urination.  Musculoskeletal:  Positive for joint pain, gait problem, joint pain, joint swelling, myalgias, muscle weakness, morning stiffness, muscle tenderness and myalgias.  Skin:  Positive for hair loss. Negative for color change, rash and sensitivity to sunlight.  Allergic/Immunologic: Positive for susceptible to infections.  Neurological:  Positive for dizziness and headaches.  Hematological:  Negative for swollen glands.  Psychiatric/Behavioral:  Positive for depressed mood and sleep disturbance. The patient is nervous/anxious.     PMFS History:  Patient Active Problem List   Diagnosis Date Noted   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   Depression 10/04/2021   Left knee pain 10/04/2021   Chronic pain 04/09/2021   Pelvic pain 04/09/2021   HLD (hyperlipidemia) 04/09/2021   Vitamin D deficiency 04/09/2021   Estrogen deficiency 04/09/2021   Hyperglycemia 04/01/2021   Chronic cough 12/30/2020   GERD (gastroesophageal reflux disease) 11/06/2019   Bilateral leg edema 10/16/2019   Pleuritic chest pain 10/16/2019   Hypertension 10/02/2019   Nasal polyps 10/02/2019   Arthralgia 06/28/2018   Sinusitis 06/28/2018  Renal cyst 05/03/2018   Syncope 05/03/2018   Stress and adjustment reaction 05/03/2018   History of tobacco use 05/02/2018   Primary osteoarthritis of both hands 04/10/2018   Primary osteoarthritis of both knees 04/10/2018   Primary osteoarthritis of both feet 04/10/2018   Achilles tendinitis 04/02/2018   Screening for breast cancer 03/15/2018   Positive ANA  (antinuclear antibody) 03/15/2018   Screening for colon cancer 03/15/2018   Rash 03/15/2018   Achilles tendon pain 11/13/2017   Cervical myelopathy with cervical radiculopathy ([place]) 11/13/2017   COPD with asthma ([place]) 06/19/2017   Allergic rhinitis 06/19/2017    Past Medical History:  Diagnosis Date   Asthma     Family History  Problem Relation Age of Onset   Angina Father    Parkinson's disease Father    Leukemia Sister    Kidney disease Sister    Kidney disease Brother    Brain cancer Brother        glioblastoma    Cancer Brother    Allergies Son    Asthma Son    Allergies Son    Past Surgical History:  Procedure Laterality Date   ABDOMINAL HYSTERECTOMY     APPENDECTOMY     HERNIA REPAIR     Umbilical x 3   TONSILLECTOMY     Social History   Social History Narrative   Not on file   Immunization History  Administered Date(s) Administered   PFIZER(Purple Top)SARS-COV-2 Vaccination 11/06/2019, 12/01/2019   Tdap 03/15/2018     Objective: Vital Signs: BP (!) 139/91 (BP Location: Right Arm, Patient Position: Sitting, Cuff Size: Normal)   Pulse 72   Resp 19   Ht 5' 3.5" (1.613 m)   Wt 194 lb 12.8 oz (88.4 kg)   BMI 33.97 kg/m    Physical Exam Vitals and nursing note reviewed.  Constitutional:      Appearance: She is well-developed.  HENT:     Head: Normocephalic and atraumatic.  Eyes:     Conjunctiva/sclera: Conjunctivae normal.  Cardiovascular:     Rate and Rhythm: Normal rate and regular rhythm.     Heart sounds: Normal heart sounds.  Pulmonary:     Effort: Pulmonary effort is normal.     Breath sounds: Normal breath sounds.  Abdominal:     General: Bowel sounds are normal.     Palpations: Abdomen is soft.  Musculoskeletal:     Cervical back: Normal range of motion.     Right lower leg: Edema present.     Left lower leg: Edema present.  Lymphadenopathy:     Cervical: No cervical adenopathy.  Skin:    General: Skin is warm and dry.      Capillary Refill: Capillary refill takes less than 2 seconds.  Neurological:     Mental Status: She is alert and oriented to person, place, and time.  Psychiatric:        Behavior: Behavior normal.      Musculoskeletal Exam: Cervical spine was in good range of motion.  She had limited painful range of motion of the lumbar spine.  Shoulder joints, elbow joints, wrist joints with good range of motion.  She had bilateral PIP and DIP thickening.  Short fourth metacarpal was noted.  No synovitis was noted.  Mild PIP and DIP thickening was noted.  Hip joints in good range of motion.  She had tenderness over bilateral trochanteric bursa.  She had good range of motion of bilateral knee joints without any warmth  swelling or effusion.  She had no tenderness over ankles or MTPs.  She had bilateral lower extremity edema.  CDAI Exam: CDAI Score: -- Patient Global: --; Provider Global: -- Swollen: --; Tender: -- Joint Exam 03/22/2022   No joint exam has been documented for this visit   There is currently no information documented on the homunculus. Go to the Rheumatology activity and complete the homunculus joint exam.  Investigation: No additional findings.  Imaging: No results found.  Recent Labs: Lab Results  Component Value Date   WBC 7.6 10/03/2021   HGB 14.9 10/03/2021   PLT 313.0 10/03/2021   NA 139 10/03/2021   K 3.8 10/03/2021   CL 100 10/03/2021   CO2 31 10/03/2021   GLUCOSE 118 (H) 10/03/2021   BUN 14 10/03/2021   CREATININE 0.78 10/03/2021   BILITOT 0.6 10/03/2021   ALKPHOS 88 10/03/2021   AST 19 10/03/2021   ALT 24 10/03/2021   PROT 7.3 10/03/2021   ALBUMIN 4.3 10/03/2021   CALCIUM 9.9 10/03/2021   GFRAA >60 07/04/2018    Speciality Comments: No specialty comments available.  Procedures:  No procedures performed Allergies: Symbicort [budesonide-formoterol fumarate], Tramadol, Celebrex [celecoxib], Chocolate flavor, Ciprofloxacin, Eggs or egg-derived products,  Flavoring agent, Lisinopril, Lyrica [pregabalin], Penicillins, Wellbutrin [bupropion], and Codeine   Assessment / Plan:     Visit Diagnoses: Primary osteoarthritis of both hands-she complains of pain and discomfort in her bilateral hands.  No synovitis was noted.  Mild PIP and DIP thickening was noted.  X-rays obtained on September 26, 2017 were reviewed which showed mild degenerative changes.  Protection muscle strengthening was discussed.  Primary osteoarthritis of both knees -she been experiencing pain and discomfort in her knee joints.  She states her left knee joint is more painful.  She has been complaining of instability in her left knee joint.  She plans to discuss that further with Dr. Glennon Mac.  She declined x-rays today.  X-rays obtained on September 26, 2017 showed mild osteoarthritic changes.  Primary osteoarthritis of both feet-complains of discomfort in her feet.  No synovitis was noted.  X-rays obtained on September 26, 2017 showed osteoarthritic changes.    Positive ANA (antinuclear antibody) - 10/03/21: ANA 1:40NH, RF<14, ESR 20, Uric acid 7.5, anti-CCP<16, CRP 1.2 -there is no history of oral ulcers, nasal ulcers, malar rash, photosensitivity, Raynaud's phenomenon or lymphadenopathy.  She has dry mouth and dry eyes symptoms most likely related to smoking.  Over-the-counter products were discussed.  I will obtain following labs today.  Plan: Protein / creatinine ratio, urine, ANA, Anti-DNA antibody, double-stranded, C3 and C4, Sjogrens syndrome-A extractable nuclear antibody, Sjogrens syndrome-B extractable nuclear antibody, Anti-Smith antibody, RNP Antibody  Cervical myelopathy with cervical radiculopathy (HCC)-she had good range of motion without discomfort.  DDD (degenerative disc disease), lumbar-she continues to have chronic pain and discomfort.  Core strengthening exercises were demonstrated.  Spinal stenosis of lumbar region with neurogenic claudication-patient had recent epidural  injections x2 without much relief per patient.  I will refer her to physical therapy.  Lower extremity numbness-she cannot have numbness in her bilateral lower extremities.  I will refer her to neurology for evaluation.  Left leg weakness-she complains of weakness in her left lower extremity.  She will be referred to neurology for evaluation.  History of recent fall-she had recent fall.  I will refer her to physical therapy for lower extremity muscle strengthening.  Bilateral leg edema-pitting and eval was noted over bilateral lower extremities.  Other medical problems are  listed as follows:  Achilles tendinitis of left lower extremity  Primary hypertension  Pure hypercholesterolemia  COPD with asthma ([place])  History of gastroesophageal reflux (GERD)  Renal cyst  Vitamin D deficiency  Stress and adjustment reaction  History of tobacco use - 1PPD X 45 years.  Orders: Orders Placed This Encounter  Procedures   Protein / creatinine ratio, urine   ANA   Anti-DNA antibody, double-stranded   C3 and C4   Sjogrens syndrome-A extractable nuclear antibody   Sjogrens syndrome-B extractable nuclear antibody   Anti-Smith antibody   RNP Antibody   Ambulatory referral to Neurology   Ambulatory referral to Physical Therapy   No orders of the defined types were placed in this encounter.    Follow-Up Instructions: Return for arthralgia, positive ANA.   Bo Merino, MD  Note - This record has been created using Editor, commissioning.  Chart creation errors have been sought, but may not always  have been located. Such creation errors do not reflect on  the standard of medical care.

## 2022-03-22 ENCOUNTER — Ambulatory Visit: Payer: Medicare Other | Attending: Rheumatology | Admitting: Rheumatology

## 2022-03-22 ENCOUNTER — Encounter: Payer: Self-pay | Admitting: Rheumatology

## 2022-03-22 VITALS — BP 139/91 | HR 72 | Resp 19 | Ht 63.5 in | Wt 194.8 lb

## 2022-03-22 DIAGNOSIS — E7800 Pure hypercholesterolemia, unspecified: Secondary | ICD-10-CM

## 2022-03-22 DIAGNOSIS — J449 Chronic obstructive pulmonary disease, unspecified: Secondary | ICD-10-CM | POA: Diagnosis present

## 2022-03-22 DIAGNOSIS — R768 Other specified abnormal immunological findings in serum: Secondary | ICD-10-CM | POA: Diagnosis not present

## 2022-03-22 DIAGNOSIS — Z9181 History of falling: Secondary | ICD-10-CM | POA: Diagnosis present

## 2022-03-22 DIAGNOSIS — F4329 Adjustment disorder with other symptoms: Secondary | ICD-10-CM | POA: Diagnosis present

## 2022-03-22 DIAGNOSIS — G959 Disease of spinal cord, unspecified: Secondary | ICD-10-CM

## 2022-03-22 DIAGNOSIS — Z87891 Personal history of nicotine dependence: Secondary | ICD-10-CM

## 2022-03-22 DIAGNOSIS — M7662 Achilles tendinitis, left leg: Secondary | ICD-10-CM | POA: Diagnosis present

## 2022-03-22 DIAGNOSIS — M5412 Radiculopathy, cervical region: Secondary | ICD-10-CM

## 2022-03-22 DIAGNOSIS — R29898 Other symptoms and signs involving the musculoskeletal system: Secondary | ICD-10-CM | POA: Diagnosis present

## 2022-03-22 DIAGNOSIS — M19072 Primary osteoarthritis, left ankle and foot: Secondary | ICD-10-CM | POA: Diagnosis present

## 2022-03-22 DIAGNOSIS — M170 Bilateral primary osteoarthritis of knee: Secondary | ICD-10-CM | POA: Diagnosis not present

## 2022-03-22 DIAGNOSIS — Z8719 Personal history of other diseases of the digestive system: Secondary | ICD-10-CM | POA: Insufficient documentation

## 2022-03-22 DIAGNOSIS — N281 Cyst of kidney, acquired: Secondary | ICD-10-CM

## 2022-03-22 DIAGNOSIS — R600 Localized edema: Secondary | ICD-10-CM | POA: Diagnosis present

## 2022-03-22 DIAGNOSIS — E559 Vitamin D deficiency, unspecified: Secondary | ICD-10-CM | POA: Insufficient documentation

## 2022-03-22 DIAGNOSIS — M19042 Primary osteoarthritis, left hand: Secondary | ICD-10-CM

## 2022-03-22 DIAGNOSIS — M19071 Primary osteoarthritis, right ankle and foot: Secondary | ICD-10-CM

## 2022-03-22 DIAGNOSIS — I10 Essential (primary) hypertension: Secondary | ICD-10-CM | POA: Insufficient documentation

## 2022-03-22 DIAGNOSIS — R200 Anesthesia of skin: Secondary | ICD-10-CM

## 2022-03-22 DIAGNOSIS — M5136 Other intervertebral disc degeneration, lumbar region: Secondary | ICD-10-CM | POA: Diagnosis present

## 2022-03-22 DIAGNOSIS — M19041 Primary osteoarthritis, right hand: Secondary | ICD-10-CM | POA: Diagnosis not present

## 2022-03-22 DIAGNOSIS — M48062 Spinal stenosis, lumbar region with neurogenic claudication: Secondary | ICD-10-CM | POA: Diagnosis present

## 2022-03-22 DIAGNOSIS — J339 Nasal polyp, unspecified: Secondary | ICD-10-CM

## 2022-03-24 ENCOUNTER — Encounter: Payer: Self-pay | Admitting: Neurology

## 2022-03-25 LAB — PROTEIN / CREATININE RATIO, URINE
Creatinine, Urine: 27 mg/dL (ref 20–275)
Total Protein, Urine: <4 mg/dL — ABNORMAL LOW (ref 5–24)

## 2022-03-25 LAB — ANTI-NUCLEAR AB-TITER (ANA TITER)
ANA TITER: 1:40 {titer} — ABNORMAL HIGH
ANA Titer 1: 1:40 {titer} — ABNORMAL HIGH

## 2022-03-25 LAB — C3 AND C4
C3 Complement: 171 mg/dL (ref 83–193)
C4 Complement: 37 mg/dL (ref 15–57)

## 2022-03-25 LAB — SJOGRENS SYNDROME-A EXTRACTABLE NUCLEAR ANTIBODY: SSA (Ro) (ENA) Antibody, IgG: <1 AI

## 2022-03-25 LAB — ANTI-SMITH ANTIBODY: ENA SM Ab Ser-aCnc: <1 AI

## 2022-03-25 LAB — SJOGRENS SYNDROME-B EXTRACTABLE NUCLEAR ANTIBODY: SSB (La) (ENA) Antibody, IgG: <1 AI

## 2022-03-25 LAB — ANTI-DNA ANTIBODY, DOUBLE-STRANDED: ds DNA Ab: <1 IU/mL

## 2022-03-25 LAB — RNP ANTIBODY: Ribonucleic Protein(ENA) Antibody, IgG: 1.7 AI — AB

## 2022-03-25 LAB — ANA: Anti Nuclear Antibody (ANA): POSITIVE — AB

## 2022-03-27 NOTE — Progress Notes (Signed)
I will discuss results at the follow-up visit.

## 2022-04-06 ENCOUNTER — Ambulatory Visit (INDEPENDENT_AMBULATORY_CARE_PROVIDER_SITE_OTHER): Payer: Medicare Other | Admitting: Internal Medicine

## 2022-04-06 ENCOUNTER — Encounter: Payer: Self-pay | Admitting: Internal Medicine

## 2022-04-06 VITALS — BP 126/80 | HR 70 | Ht 63.5 in | Wt 195.0 lb

## 2022-04-06 DIAGNOSIS — M79604 Pain in right leg: Secondary | ICD-10-CM | POA: Diagnosis not present

## 2022-04-06 DIAGNOSIS — G8929 Other chronic pain: Secondary | ICD-10-CM

## 2022-04-06 DIAGNOSIS — M79671 Pain in right foot: Secondary | ICD-10-CM

## 2022-04-06 DIAGNOSIS — M79672 Pain in left foot: Secondary | ICD-10-CM

## 2022-04-06 DIAGNOSIS — E7800 Pure hypercholesterolemia, unspecified: Secondary | ICD-10-CM

## 2022-04-06 DIAGNOSIS — F32A Depression, unspecified: Secondary | ICD-10-CM | POA: Diagnosis not present

## 2022-04-06 DIAGNOSIS — L708 Other acne: Secondary | ICD-10-CM | POA: Diagnosis not present

## 2022-04-06 DIAGNOSIS — I10 Essential (primary) hypertension: Secondary | ICD-10-CM

## 2022-04-06 DIAGNOSIS — R739 Hyperglycemia, unspecified: Secondary | ICD-10-CM | POA: Diagnosis not present

## 2022-04-06 DIAGNOSIS — M7122 Synovial cyst of popliteal space [Baker], left knee: Secondary | ICD-10-CM | POA: Insufficient documentation

## 2022-04-06 DIAGNOSIS — E2839 Other primary ovarian failure: Secondary | ICD-10-CM

## 2022-04-06 DIAGNOSIS — M79605 Pain in left leg: Secondary | ICD-10-CM

## 2022-04-06 DIAGNOSIS — F17200 Nicotine dependence, unspecified, uncomplicated: Secondary | ICD-10-CM

## 2022-04-06 DIAGNOSIS — J342 Deviated nasal septum: Secondary | ICD-10-CM

## 2022-04-06 LAB — BASIC METABOLIC PANEL
BUN: 18 mg/dL (ref 6–23)
CO2: 31 mEq/L (ref 19–32)
Calcium: 10.1 mg/dL (ref 8.4–10.5)
Chloride: 101 mEq/L (ref 96–112)
Creatinine, Ser: 0.71 mg/dL (ref 0.40–1.20)
GFR: 88.63 mL/min (ref >60.00)
Glucose, Bld: 98 mg/dL (ref 70–99)
Potassium: 4.5 mEq/L (ref 3.5–5.1)
Sodium: 138 mEq/L (ref 135–145)

## 2022-04-06 LAB — LIPID PANEL
Cholesterol: 214 mg/dL — ABNORMAL HIGH (ref 0–200)
HDL: 45 mg/dL (ref >39.00)
NonHDL: 168.89
Total CHOL/HDL Ratio: 5
Triglycerides: 203 mg/dL — ABNORMAL HIGH (ref 0.0–149.0)
VLDL: 40.6 mg/dL — ABNORMAL HIGH (ref 0.0–40.0)

## 2022-04-06 LAB — LDL CHOLESTEROL, DIRECT: Direct LDL: 155 mg/dL

## 2022-04-06 MED ORDER — DOXYCYCLINE HYCLATE 100 MG PO TABS
100.0000 mg | ORAL_TABLET | Freq: Two times a day (BID) | ORAL | 0 refills | Status: DC
Start: 2022-04-06 — End: 2022-04-18

## 2022-04-06 MED ORDER — DULOXETINE HCL 60 MG PO CPEP: 60.0000 mg | ORAL_CAPSULE | Freq: Every day | ORAL | 3 refills | Status: DC

## 2022-04-06 MED ORDER — GABAPENTIN 100 MG PO CAPS: ORAL_CAPSULE | ORAL | 1 refills | Status: DC

## 2022-04-06 NOTE — Progress Notes (Signed)
Patient ID: Frances Nelson, female   DOB: 13-Mar-1956, 66 y.o.   MRN: [id number]         Chief Complaint:: yearly exam       HPI:  Frances Nelson is a 66 y.o. female here with f/u hyperglycemia, hld, chronic pain, left knee pain, chronic bilateral  feet and ankle pain, and deviated nasal septum, depression, all more symptomatic recently.  Pt denies chest pain, increased sob or doe, wheezing, orthopnea, PND, increased LE swelling, palpitations, dizziness or syncope.   Pt denies polydipsia, polyuria, or new focal neuro s/s.    Pt denies fever, wt loss, night sweats, loss of appetite, or other constitutional symptoms    Stopped citalopram due to sleepiness per pt.  Has been taking the cymbalta 30 mg and started gabapentin 100 last night, and slept well last night.    Wt down 10 lbs since may 2023 with better diet.   Saw rheumatology sept 6.  Peak wt has been 218, has been doing intermittent fasting.  Still smoking not ready to quit. Wt Readings from Last 3 Encounters:  04/06/22 195 lb (88.5 kg)  03/22/22 194 lb 12.8 oz (88.4 kg)  12/09/21 205 lb (93 kg)   BP Readings from Last 3 Encounters:  04/06/22 126/80  03/22/22 (!) 139/91  12/09/21 130/78   Immunization History  Administered Date(s) Administered   PFIZER(Purple Top)SARS-COV-2 Vaccination 11/06/2019, 12/01/2019   Tdap 03/15/2018   Health Maintenance Due  Topic Date Due   Zoster Vaccines- Shingrix (1 of 2) Never done   COVID-19 Vaccine (3 - Pfizer series) 01/26/2020   DEXA SCAN  Never done   MAMMOGRAM  01/15/2022   INFLUENZA VACCINE  Never done      Past Medical History:  Diagnosis Date   Asthma    Past Surgical History:  Procedure Laterality Date   ABDOMINAL HYSTERECTOMY     APPENDECTOMY     HERNIA REPAIR     Umbilical x 3   TONSILLECTOMY      reports that she has been smoking cigarettes. She has a 40.00 pack-year smoking history. She has never been exposed to tobacco smoke. She has never used smokeless tobacco. She reports  that she does not drink alcohol and does not use drugs. family history includes Allergies in her son and son; Angina in her father; Asthma in her son; Brain cancer in her brother; Cancer in her brother; Kidney disease in her brother and sister; Leukemia in her sister; Parkinson's disease in her father. Allergies  Allergen Reactions   Symbicort [Budesonide-Formoterol Fumarate] Other (See Comments)    Patient reported dizziness, nausea, headaches and sore throat while using Symbicort 160. Reported on 09/03/17   Tramadol Hives   Celebrex [Celecoxib]     Bleeding.     Chocolate Flavor    Ciprofloxacin Nausea And Vomiting    Headache, shaking.    Eggs Or Egg-Derived Products    Flavoring Agent     Unknown   Lisinopril Cough    Pt off med for a week, some improvement   Lyrica [Pregabalin]     "Made me out of it."    Penicillins     Patient preference   Wellbutrin [Bupropion]    Codeine Palpitations   Current Outpatient Medications on File Prior to Visit  Medication Sig Dispense Refill   acidophilus (RISAQUAD) CAPS capsule Take 1 capsule by mouth daily.     albuterol (PROVENTIL) (2.5 MG/3ML) 0.083% nebulizer solution USE 1 VIAL IN NEBULIZER EVERY 6HRS FOR  SHORTNESS OF BREATH/WHEEZING 300 mL 5   albuterol (VENTOLIN HFA) 108 (90 Base) MCG/ACT inhaler INHALE 2 PUFFS BY MOUTH EVERY 6 HOURS AS NEEDED FOR WHEEZING/SHORTNESS OF BREATH 18 each 11   Calcium-Vitamins C & D (CALCIUM/C/D PO) Take by mouth.     cetirizine (ZYRTEC) 10 MG tablet Take 10 mg by mouth daily.     Cholecalciferol (HM VITAMIN D3) 100 MCG (4000 UT) CAPS Take 4,000 Units by mouth daily.     hydrochlorothiazide (HYDRODIURIL) 12.5 MG tablet Take 1 tablet (12.5 mg total) by mouth daily. (Patient taking differently: Take 12.5 mg by mouth as needed.) 90 tablet 2   losartan (COZAAR) 25 MG tablet Take 1 tablet (25 mg total) by mouth daily. 90 tablet 1   MELATONIN PO Take by mouth at bedtime.     meloxicam (MOBIC) 15 MG tablet Take 0.5  tablets (7.5 mg total) by mouth daily. 60 tablet 0   methocarbamol (ROBAXIN) 750 MG tablet TAKE 1 TABLET (750 MG TOTAL) BY MOUTH EVERY 6 (SIX) HOURS AS NEEDED FOR MUSCLE SPASMS (NOT COVERED) 120 tablet 2   mometasone (NASONEX) 50 MCG/ACT nasal spray Place 2 sprays into the nose daily.     montelukast (SINGULAIR) 10 MG tablet TAKE 1 TABLET BY MOUTH EVERYDAY AT BEDTIME 90 tablet 3   Multiple Vitamins-Minerals (MULTIVITAMIN WITH MINERALS) tablet Take 1 tablet by mouth daily.     omeprazole (PRILOSEC) 40 MG capsule TAKE 1 CAPSULE (40 MG TOTAL) BY MOUTH IN THE MORNING AND AT BEDTIME. 180 capsule 3   No current facility-administered medications on file prior to visit.        ROS:  All others reviewed and negative.  Objective        PE:  BP 126/80   Pulse 70   Ht 5' 3.5" (1.613 m)   Wt 195 lb (88.5 kg)   SpO2 98%   BMI 34.00 kg/m                 Constitutional: Pt appears in NAD               HENT: Head: NCAT.                Right Ear: External ear normal.                 Left Ear: External ear normal.                Eyes: . Pupils are equal, round, and reactive to light. Conjunctivae and EOM are normal               Nose: without d/c or deformity; left face with multiple tender follicular lesions               Neck: Neck supple. Gross normal ROM               Cardiovascular: Normal rate and regular rhythm.                 Pulmonary/Chest: Effort normal and breath sounds without rales or wheezing.                Abd:  Soft, NT, ND, + BS, no organomegaly               Neurological: Pt is alert. At baseline orientation, motor grossly intact               Skin: Skin is warm. No rashes, no other new  lesions, LE edema - none               Psychiatric: Pt behavior is normal without agitation   Micro: none  Cardiac tracings I have personally interpreted today:  none  Pertinent Radiological findings (summarize): none   Lab Results  Component Value Date   WBC 7.6 10/03/2021   HGB 14.9  10/03/2021   HCT 44.0 10/03/2021   PLT 313.0 10/03/2021   GLUCOSE 98 04/06/2022   CHOL 214 (H) 04/06/2022   TRIG 203.0 (H) 04/06/2022   HDL 45.00 04/06/2022   LDLDIRECT 155.0 04/06/2022   ALT 24 10/03/2021   AST 19 10/03/2021   NA 138 04/06/2022   K 4.5 04/06/2022   CL 101 04/06/2022   CREATININE 0.71 04/06/2022   BUN 18 04/06/2022   CO2 31 04/06/2022   TSH 2.73 09/29/2021   HGBA1C 5.5 04/06/2022   Assessment/Plan:  Frances Nelson is a 66 y.o. White or Caucasian [1] female with  has a past medical history of Asthma.  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  Cathlean Cower, MD 04/08/2022 8:50 PM [facility]

## 2022-04-06 NOTE — Patient Instructions (Signed)
Ok to increase the cymbalta to 60 mg per day  Ok to restart the gabapentin at 100 mg three times per day and let us know if you need higher dose in 1 week  Please continue all other medications as before, and refills have been done if requested.  Please have the pharmacy call with any other refills you may need.  Please continue your efforts at being more active, low cholesterol diet, and weight control.  You are otherwise up to date with prevention measures today.  Please keep your appointments with your specialists as you may have planned  You will be contacted regarding the referral for: Sports Medicine, Ortho (Dr Doran Durand), and ENT (Dr Benjamine Mola)  Please go to the LAB at the blood drawing area for the tests to be done  You will be contacted by phone if any changes need to be made immediately.  Otherwise, you will receive a letter about your results with an explanation, but please check with MyChart first.  Please remember to sign up for MyChart if you have not done so, as this will be important to you in the future with finding out test results, communicating by private email, and scheduling acute appointments online when needed.  Please make an Appointment to return in 6 months, or sooner if needed

## 2022-04-07 LAB — HEMOGLOBIN A1C
Hgb A1c MFr Bld: 5.5 % of total Hgb (ref <5.7)
Mean Plasma Glucose: 111 mg/dL
eAG (mmol/L): 6.2 mmol/L

## 2022-04-07 NOTE — Progress Notes (Signed)
Office Visit Note  Patient: Frances Nelson             Date of Birth: 09-Nov-1955           MRN: [id number]             PCP: Biagio Borg, MD Referring: Biagio Borg, MD Visit Date: 04/18/2022 Occupation: '@GUAROCC'$ @  Subjective:  Pain in joints, dry mouth and dry eyes  History of Present Illness: Frances Nelson is a 66 y.o. female with history of positive ANA, osteoarthritis, dry mouth and dry eyes.  She continues to have pain and stiffness in her bilateral hands, knee joints and her feet.  She has neck and lower back pain due to underlying disc disease.  She states she has not had much problems with Raynaud's phenomenon recently.  She continues to have dry mouth and dry eye symptoms.  She has an appointment coming up with the neurologist in November for neuropathy.  She denies any increased shortness of breath.  She states she has baseline shortness of breath due to underlying asthma.  She has been followed by Dr. Lamonte Sakai.  He denies any history of palpitations.  Activities of Daily Living:  Patient reports morning stiffness for 2-3 hours.   Patient Reports nocturnal pain.  Difficulty dressing/grooming: Reports Difficulty climbing stairs: Reports Difficulty getting out of chair: Reports Difficulty using hands for taps, buttons, cutlery, and/or writing: Reports  Review of Systems  Constitutional:  Positive for fatigue.  HENT:  Positive for mouth dryness. Negative for mouth sores.   Eyes:  Positive for dryness.  Respiratory:  Positive for shortness of breath.        Asthma  Cardiovascular:  Negative for chest pain and palpitations.  Gastrointestinal:  Negative for blood in stool, constipation and diarrhea.  Endocrine: Negative for increased urination.  Genitourinary:  Negative for involuntary urination.  Musculoskeletal:  Positive for joint pain, gait problem, joint pain, myalgias, muscle weakness, morning stiffness, muscle tenderness and myalgias. Negative for joint swelling.  Skin:   Positive for rash, hair loss and sensitivity to sunlight. Negative for color change.  Allergic/Immunologic: Positive for susceptible to infections.  Neurological:  Positive for dizziness and headaches.  Hematological:  Negative for swollen glands.  Psychiatric/Behavioral:  Positive for sleep disturbance. Negative for depressed mood. The patient is not nervous/anxious.     PMFS History:  Patient Active Problem List   Diagnosis Date Noted   Smoker 04/08/2022   Deviated nasal septum 04/08/2022   Bilateral pain of leg and foot 00/86/7619   Follicular acne 50/93/2671   Baker cyst, left 04/06/2022   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   Depression 10/04/2021   Left knee pain 10/04/2021   Chronic pain 04/09/2021   Pelvic pain 04/09/2021   HLD (hyperlipidemia) 04/09/2021   Vitamin D deficiency 04/09/2021   Estrogen deficiency 04/09/2021   Hyperglycemia 04/01/2021   Chronic cough 12/30/2020   GERD (gastroesophageal reflux disease) 11/06/2019   Bilateral leg edema 10/16/2019   Pleuritic chest pain 10/16/2019   Hypertension 10/02/2019   Nasal polyps 10/02/2019   Arthralgia 06/28/2018   Sinusitis 06/28/2018   Renal cyst 05/03/2018   Syncope 05/03/2018   Stress and adjustment reaction 05/03/2018   History of tobacco use 05/02/2018   Primary osteoarthritis of both hands 04/10/2018   Primary osteoarthritis of both knees 04/10/2018   Primary osteoarthritis of both feet 04/10/2018   Achilles tendinitis 04/02/2018   Screening for breast cancer 03/15/2018   Positive  ANA (antinuclear antibody) 03/15/2018   Screening for colon cancer 03/15/2018   Rash 03/15/2018   Achilles tendon pain 11/13/2017   Cervical myelopathy with cervical radiculopathy ([place]) 11/13/2017   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   Allergic rhinitis 06/19/2017    Past Medical History:  Diagnosis Date   Asthma     Family History  Problem Relation Age of Onset   Angina Father     Parkinson's disease Father    Leukemia Sister    Kidney disease Sister    Kidney disease Brother    Brain cancer Brother        glioblastoma    Cancer Brother    Allergies Son    Asthma Son    Allergies Son    Past Surgical History:  Procedure Laterality Date   ABDOMINAL HYSTERECTOMY     APPENDECTOMY     HERNIA REPAIR     Umbilical x 3   TONSILLECTOMY     Social History   Social History Narrative   Not on file   Immunization History  Administered Date(s) Administered   PFIZER(Purple Top)SARS-COV-2 Vaccination 11/06/2019, 12/01/2019   Tdap 03/15/2018     Objective: Vital Signs: BP 121/82 (BP Location: Left Arm, Patient Position: Sitting, Cuff Size: Normal)   Pulse 69   Resp 18   Ht 5' 3.75" (1.619 m)   Wt 194 lb 9.6 oz (88.3 kg)   BMI 33.67 kg/m    Physical Exam Vitals and nursing note reviewed.  Constitutional:      Appearance: She is well-developed.  HENT:     Head: Normocephalic and atraumatic.  Eyes:     Conjunctiva/sclera: Conjunctivae normal.  Cardiovascular:     Rate and Rhythm: Normal rate and regular rhythm.     Heart sounds: Normal heart sounds.  Pulmonary:     Effort: Pulmonary effort is normal.     Breath sounds: Normal breath sounds.  Abdominal:     General: Bowel sounds are normal.     Palpations: Abdomen is soft.  Musculoskeletal:     Cervical back: Normal range of motion.  Lymphadenopathy:     Cervical: No cervical adenopathy.  Skin:    General: Skin is warm and dry.     Capillary Refill: Capillary refill takes less than 2 seconds.  Neurological:     Mental Status: She is alert and oriented to person, place, and time.  Psychiatric:        Behavior: Behavior normal.      Musculoskeletal Exam: Cervical spine was in good range of motion.  She had discomfort range of motion of the lumbar spine.  Shoulder joints, elbow joints, wrist joints, MCPs PIPs and DIPs were in good range of motion with no synovitis.  She had bilateral PIP and DIP  thickening.  She had bilateral short fourth metacarpal.  Hip joints and knee joints with good range of motion.  There was no tenderness over ankles or MTPs.  CDAI Exam: CDAI Score: -- Patient Global: --; Provider Global: -- Swollen: --; Tender: -- Joint Exam 04/18/2022   No joint exam has been documented for this visit   There is currently no information documented on the homunculus. Go to the Rheumatology activity and complete the homunculus joint exam.  Investigation: No additional findings.  Imaging: No results found.  Recent Labs: Lab Results  Component Value Date   WBC 7.6 10/03/2021   HGB 14.9 10/03/2021   PLT 313.0 10/03/2021   NA 138 04/06/2022   K 4.5  04/06/2022   CL 101 04/06/2022   CO2 31 04/06/2022   GLUCOSE 98 04/06/2022   BUN 18 04/06/2022   CREATININE 0.71 04/06/2022   BILITOT 0.6 10/03/2021   ALKPHOS 88 10/03/2021   AST 19 10/03/2021   ALT 24 10/03/2021   PROT 7.3 10/03/2021   ALBUMIN 4.3 10/03/2021   CALCIUM 10.1 04/06/2022   GFRAA >60 07/04/2018     Speciality Comments: No specialty comments available.  Procedures:  No procedures performed Allergies: Symbicort [budesonide-formoterol fumarate], Tramadol, Celebrex [celecoxib], Chocolate flavor, Ciprofloxacin, Eggs or egg-derived products, Flavoring agent, Lisinopril, Lyrica [pregabalin], Penicillins, Wellbutrin [bupropion], and Codeine   Assessment / Plan:     Visit Diagnoses: Positive ANA (antinuclear antibody) - March 22, 2022 urine protein negative, ANA 1: 40NH and cytoplasmic, RNP 1.7,(double-stranded DNA, Smith,  SSA, SSB negative), C3-C4 normal.left findings were discussed with the patient at length.  Patient gives history of dry mouth and dry eyes related to smoking.  She does not have features of any other autoimmune disease.  She gives history of mild Raynaud's symptoms which most likely are related to smoking.  She denies any history of oral ulcers, nasal ulcers, malar rash,  photosensitivity or inflammatory arthritis.  I advised her to contact me if she develops any new symptoms.  I will repeat her labs in a year prior to her next appointment.  Over-the-counter products for dry mouth were discussed.  Primary osteoarthritis of both hands - Clinical and radiographic findings are consistent with early degenerative changes.  Detailed counsel regarding osteoarthritis was provided.  Joint protection and muscle strengthening was discussed.  Joint protection muscle strengthening was discussed.  Primary osteoarthritis of both knees - X-rays from 2019 showed mild degenerative changes.  She has been followed by an orthopedic surgeon.  Lower extremity muscle strengthening exercises were discussed.  Primary osteoarthritis of both feet - Clinical and radiographic findings are consistent with osteoarthritis.  No synovitis was noted.  Proper fitting shoes were advised.  DDD (degenerative disc disease), cervical - Patient had good range of motion without any radiculopathy.  DDD (degenerative disc disease), lumbar - With history of a spinal stenosis.  Patient had epidural injection x2.  She was referred to physical therapy.  Lower extremity numbness - Patient was referred to neurology.  She has an appointment in November.  Left leg weakness  History of recent fall  Bilateral leg edema  Primary hypertension  Pure hypercholesterolemia  History of gastroesophageal reflux (GERD)  Renal cyst  COPD with asthma  Vitamin D deficiency  Stress and adjustment reaction  History of tobacco use - 1 pack/day for 45 years  Orders: Orders Placed This Encounter  Procedures   CBC with Differential/Platelet   COMPLETE METABOLIC PANEL WITH GFR   ANA   RNP Antibody   C3 and C4   Sedimentation rate   Protein / creatinine ratio, urine   No orders of the defined types were placed in this encounter.    Follow-Up Instructions: Return in about 1 year (around 04/19/2023) for +RNP,  OA.   Bo Merino, MD  Note - This record has been created using Editor, commissioning.  Chart creation errors have been sought, but may not always  have been located. Such creation errors do not reflect on  the standard of medical care.

## 2022-04-08 DIAGNOSIS — F17200 Nicotine dependence, unspecified, uncomplicated: Secondary | ICD-10-CM | POA: Insufficient documentation

## 2022-04-08 DIAGNOSIS — M79604 Pain in right leg: Secondary | ICD-10-CM | POA: Insufficient documentation

## 2022-04-08 DIAGNOSIS — J342 Deviated nasal septum: Secondary | ICD-10-CM | POA: Insufficient documentation

## 2022-04-08 NOTE — Assessment & Plan Note (Signed)
Mild to mod, for doxy course,  to f/u any worsening symptoms or concerns

## 2022-04-08 NOTE — Assessment & Plan Note (Signed)
Chronic, near debilitating, for ortho referral dr Doran Durand

## 2022-04-08 NOTE — Assessment & Plan Note (Signed)
For DXA as is due

## 2022-04-08 NOTE — Assessment & Plan Note (Signed)
Pt also requests referral ENT

## 2022-04-08 NOTE — Assessment & Plan Note (Signed)
BP Readings from Last 3 Encounters:  04/06/22 126/80  03/22/22 (!) 139/91  12/09/21 130/78   Stable, pt to continue medical treatment hct 12.5 qd, losartan 25 mg qd

## 2022-04-08 NOTE — Assessment & Plan Note (Signed)
Worsening, for sport med referral

## 2022-04-08 NOTE — Assessment & Plan Note (Signed)
counsled to quit, pt not ready

## 2022-04-08 NOTE — Assessment & Plan Note (Signed)
Uncontrolled, pt to restart gabapentin as helped in past

## 2022-04-08 NOTE — Assessment & Plan Note (Signed)
Mild worsening, also for increased cymbalta to 60 mg qd

## 2022-04-08 NOTE — Assessment & Plan Note (Signed)
Lab Results  Component Value Date   HGBA1C 5.5 04/06/2022   Stable, pt to continue current medical treatment  - diet, wt control, excercise

## 2022-04-08 NOTE — Assessment & Plan Note (Signed)
  Stable, pt to continue current low chol diet, declines statin

## 2022-04-11 ENCOUNTER — Telehealth: Payer: Self-pay | Admitting: Emergency Medicine

## 2022-04-11 ENCOUNTER — Encounter: Payer: Self-pay | Admitting: Emergency Medicine

## 2022-04-11 ENCOUNTER — Ambulatory Visit (INDEPENDENT_AMBULATORY_CARE_PROVIDER_SITE_OTHER): Payer: Medicare Other | Admitting: Emergency Medicine

## 2022-04-11 ENCOUNTER — Other Ambulatory Visit (HOSPITAL_COMMUNITY): Payer: Self-pay

## 2022-04-11 ENCOUNTER — Encounter: Payer: Self-pay | Admitting: Internal Medicine

## 2022-04-11 DIAGNOSIS — J449 Chronic obstructive pulmonary disease, unspecified: Secondary | ICD-10-CM | POA: Diagnosis not present

## 2022-04-11 DIAGNOSIS — J301 Allergic rhinitis due to pollen: Secondary | ICD-10-CM | POA: Diagnosis not present

## 2022-04-11 NOTE — Progress Notes (Unsigned)
Frances Nelson D.Frances Nelson Phone: [phone]   Assessment and Plan:     There are no diagnoses linked to this encounter.  ***   Pertinent previous records reviewed include ***   Follow Up: ***     Subjective:    I, Frances Nelson, am serving as a Education administrator for Doctor Glennon Mac   Chief Complaint: left sided pain    HPI:  10/03/2021 Patient is a 66 year old female complaining of left ankle, left leg, and low back pain. Patient states that her left sided pain started 2015 hasnt been getting any better , Achilles tendonitis bilateral left is more painful, left knee pain , fell down the stairs feb 7th and caught herself her legs were underneath her in a dive position her knee had given out on her. Has picture of her ankle from her fall, low back pain is all the way across pain is mostly in the middle , has numbness and tingling her toes are always numb gets weird sensations , feels like ice cold water is dripping down the back of her leg, gets really sharp pains on her legs and feet, was taking ib for the swelling but has had reactions with it nausea, seems to work in reverse the ib makes the swelling worst, takes tylenol arthritis 650 mg that seems to help a little, when she takes she is able to sleep at night. Pt says she has a lump on her left heel and left knee and a cyst in the back of her knee   10/24/2021 Patient states that today her legs are killing her, had improvement until it got cold again and then the pain came back    10/28/2021 Patient states that she still hs pain in her legs and feet, toes are still numb, beginning of march she fell down the stairs and her left foot 3 great toe down are still bruised look like blood blisters , still getting sharp pains    11/17/2021 Patient states had epidural and some of the swelling is gone and the numbness in legs are gone but the pain is now more intense.  Pain in the shoulders radiating down in both arms to the elbows. These pains have started only since the epidural. Also has had a headache and can get more intense. Sometimes also will get sporadic pain in foot that causes a reflex reaction.   12/09/2021 Patient states that she is walking without her cane the pain is better,  feet and ankle still bother her thinks it more from arthritis,  isn't having the leg pain, was able to lay on the ground for the first time in 6 years, was able to raise single leg got pain when she tried to raise both legs    04/12/2022 Patient states   Relevant Historical Information: Positive ANA in 2019, but no recent follow-up with rheumatology, chronic pain in multiple locations, hypertension, COPD  Additional pertinent review of systems negative.   Current Outpatient Medications:    acidophilus (RISAQUAD) CAPS capsule, Take 1 capsule by mouth daily., Disp: , Rfl:    albuterol (PROVENTIL) (2.5 MG/3ML) 0.083% nebulizer solution, USE 1 VIAL IN NEBULIZER EVERY 6HRS FOR SHORTNESS OF BREATH/WHEEZING, Disp: 300 mL, Rfl: 5   albuterol (VENTOLIN HFA) 108 (90 Base) MCG/ACT inhaler, INHALE 2 PUFFS BY MOUTH EVERY 6 HOURS AS NEEDED FOR WHEEZING/SHORTNESS OF BREATH, Disp: 18 each, Rfl: 11   Calcium-Vitamins C &  D (CALCIUM/C/D PO), Take by mouth., Disp: , Rfl:    cetirizine (ZYRTEC) 10 MG tablet, Take 10 mg by mouth daily., Disp: , Rfl:    Cholecalciferol (HM VITAMIN D3) 100 MCG (4000 UT) CAPS, Take 4,000 Units by mouth daily., Disp: , Rfl:    doxycycline (VIBRA-TABS) 100 MG tablet, Take 1 tablet (100 mg total) by mouth 2 (two) times daily., Disp: 20 tablet, Rfl: 0   DULoxetine (CYMBALTA) 60 MG capsule, Take 1 capsule (60 mg total) by mouth daily., Disp: 90 capsule, Rfl: 3   gabapentin (NEURONTIN) 100 MG capsule, TAKE 1 CAPSULE (100 MG TOTAL) BY MOUTH THREE TIMES DAILY., Disp: 270 capsule, Rfl: 1   hydrochlorothiazide (HYDRODIURIL) 12.5 MG tablet, Take 1 tablet (12.5 mg total) by  mouth daily. (Patient taking differently: Take 12.5 mg by mouth as needed.), Disp: 90 tablet, Rfl: 2   losartan (COZAAR) 25 MG tablet, Take 1 tablet (25 mg total) by mouth daily., Disp: 90 tablet, Rfl: 1   MELATONIN PO, Take by mouth at bedtime., Disp: , Rfl:    meloxicam (MOBIC) 15 MG tablet, Take 0.5 tablets (7.5 mg total) by mouth daily., Disp: 60 tablet, Rfl: 0   methocarbamol (ROBAXIN) 750 MG tablet, TAKE 1 TABLET (750 MG TOTAL) BY MOUTH EVERY 6 (SIX) HOURS AS NEEDED FOR MUSCLE SPASMS (NOT COVERED), Disp: 120 tablet, Rfl: 2   mometasone (NASONEX) 50 MCG/ACT nasal spray, Place 2 sprays into the nose daily., Disp: , Rfl:    montelukast (SINGULAIR) 10 MG tablet, TAKE 1 TABLET BY MOUTH EVERYDAY AT BEDTIME, Disp: 90 tablet, Rfl: 3   Multiple Vitamins-Minerals (MULTIVITAMIN WITH MINERALS) tablet, Take 1 tablet by mouth daily., Disp: , Rfl:    omeprazole (PRILOSEC) 40 MG capsule, TAKE 1 CAPSULE (40 MG TOTAL) BY MOUTH IN THE MORNING AND AT BEDTIME., Disp: 180 capsule, Rfl: 3   Objective:     There were no vitals filed for this visit.    There is no height or weight on file to calculate BMI.    Physical Exam:    ***   Electronically signed by:  Fr[facility] 7:53 AM 04/11/22

## 2022-04-11 NOTE — Patient Instructions (Addendum)
We will consult with our clinical pharmacist to investigate which long-acting inhaled medication might be most affordable. Keep your albuterol available to use 2 puffs or 1 nebulizer treatment if needed for shortness of breath, chest tightness, wheezing. Continue Singulair as you have been taking Continue your Zyrtec and Nasonex as you have been taking them Follow with APP in 6 months Follow with Dr Lamonte Sakai in 12 months, sooner if you have any problems.

## 2022-04-11 NOTE — Telephone Encounter (Signed)
Formulary alternatives reviewed:  Anoro- current co-pay is $265.13 due to a pending Deductible that has not been met yet, after this has been met the patients co-pay is $47.00  Bevespi- current co-pay is also $265.13 due to the Deductible not currently being met, after this has been met the medication will be a $47.00 co-pay as well.

## 2022-04-11 NOTE — Progress Notes (Signed)
Subjective:    Patient ID: Frances Nelson, female    DOB: 12/22/1955, 66 y.o.   MRN: [id number]  COPD She complains of cough and shortness of breath. There is no wheezing. Pertinent negatives include no ear pain, fever, headaches, postnasal drip, rhinorrhea, sneezing, sore throat or trouble swallowing. Her past medical history is significant for COPD.     ROV 09/21/21 --Frances Nelson is 47, has a history of tobacco use (1.5 packs/day), COPD/asthma.  She has chronic cough due to this as well as GERD and allergic rhinitis. 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  I tried her on Spiriva Respimat (poorly tolerates powdered inhalers). She reports  She was treated for an acute bronchitis/sinusitis in November with prednisone, doxycycline Today she reports that she is doing. She is unsure whether she got much out of the Spiriva Respimat when she used it for a month. She is having a lot of nasal congestion, feels worst in the am and pm - does ok in the middle of the day. Increased cough, clear mucous. Her albuterol and spiriva are apparently not covered by her insurance. She using albuterol about 1x a day, either HFA or nebulized.   ROV 04/11/22 --follow-up visit pleasant 66 year old woman, active smoker (1.5 packs/day) with associated COPD as well as chronic cough.  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  Currently managed on Singulair, has albuterol available to use, takes bout 2x a day.  We tried her on Spiriva in the past but was not well covered by her insurance. She able to exert but does get SOB w stairs, longer walks. Some cough in the am, occasionally will wake up at night. Thick mucous, clear, no blood.     Review of Systems  Constitutional:  Negative for fever and unexpected weight change.  HENT:  Positive for congestion.  Negative for dental problem, ear pain, nosebleeds, postnasal drip, rhinorrhea, sinus pressure, sneezing, sore throat and trouble swallowing.   Eyes:  Negative for redness and itching.  Respiratory:  Positive for cough and shortness of breath. Negative for chest tightness and wheezing.   Cardiovascular:  Negative for palpitations and leg swelling.  Gastrointestinal:  Negative for nausea and vomiting.  Genitourinary:  Negative for dysuria.  Musculoskeletal:  Negative for joint swelling.  Skin:  Negative for rash.  Neurological:  Negative for headaches.  Hematological:  Does not bruise/bleed easily.  Psychiatric/Behavioral:  Negative for dysphoric mood. The patient is not nervous/anxious.        Objective:   Physical Exam Vitals:   04/11/22 1402  BP: 112/64  Pulse: 82  Temp: 98.2 F (36.8 C)  TempSrc: Oral  SpO2: 96%  Weight: 196 lb 6.4 oz (89.1 kg)  Height: 5' 3.75" (1.619 m)    Gen: Pleasant, overweight woman, in no distress,  normal affect  ENT: No lesions,  mouth clear,  oropharynx clear, no postnasal drip, hoarse voice  Neck: No JVD, no stridor  Lungs: No use of accessory muscles, distant clear  Cardiovascular: RRR, heart sounds normal, no murmur or gallops, no peripheral edema  Musculoskeletal: No deformities, no cyanosis or clubbing.   Neuro: alert, non focal  Skin: Warm, no lesions or rash      Assessment & Plan:  COPD with asthma ([place]) Still smokes.  No interest in stopping.  She likely needs to be on long-acting medication but cost  has been an issue.  We will try to find most for blood option  We will consult with our clinical pharmacist to investigate which long-acting inhaled medication might be most affordable. Keep your albuterol available to use 2 puffs or 1 nebulizer treatment if needed for shortness of breath, chest tightness, wheezing. Continue Singulair as you have been taking Follow with APP in 6 months Follow with Dr Lamonte Sakai in 12 months, sooner  if you have any problems.   Allergic rhinitis She is having posterior pharyngeal soreness, erythema.  She is been seen by Dr.Toeh before.  I will get the notes from that visit.  May need to send her back for further evaluation.  Continue your Zyrtec and Nasonex as you have been taking them   Baltazar Apo, MD, PhD 04/11/2022, 2:24 PM [facility] [phone] or if no answer [phone]

## 2022-04-11 NOTE — Telephone Encounter (Signed)
Need to evaluate her formulary, cost and coverage for LABA/LAMA.  Thank you

## 2022-04-11 NOTE — Assessment & Plan Note (Signed)
She is having posterior pharyngeal soreness, erythema.  She is been seen by Dr.Toeh before.  I will get the notes from that visit.  May need to send her back for further evaluation.  Continue your Zyrtec and Nasonex as you have been taking them

## 2022-04-11 NOTE — Assessment & Plan Note (Signed)
Still smokes.  No interest in stopping.  She likely needs to be on long-acting medication but cost has been an issue.  We will try to find most for blood option  We will consult with our clinical pharmacist to investigate which long-acting inhaled medication might be most affordable. Keep your albuterol available to use 2 puffs or 1 nebulizer treatment if needed for shortness of breath, chest tightness, wheezing. Continue Singulair as you have been taking Follow with APP in 6 months Follow with Dr Lamonte Sakai in 12 months, sooner if you have any problems.

## 2022-04-12 ENCOUNTER — Ambulatory Visit (INDEPENDENT_AMBULATORY_CARE_PROVIDER_SITE_OTHER): Payer: Medicare Other | Admitting: Sports Medicine

## 2022-04-12 VITALS — BP 140/80 | HR 79 | Ht 63.0 in | Wt 196.0 lb

## 2022-04-12 DIAGNOSIS — M51369 Other intervertebral disc degeneration, lumbar region without mention of lumbar back pain or lower extremity pain: Secondary | ICD-10-CM

## 2022-04-12 DIAGNOSIS — M1712 Unilateral primary osteoarthritis, left knee: Secondary | ICD-10-CM

## 2022-04-12 DIAGNOSIS — M5442 Lumbago with sciatica, left side: Secondary | ICD-10-CM | POA: Diagnosis not present

## 2022-04-12 DIAGNOSIS — R768 Other specified abnormal immunological findings in serum: Secondary | ICD-10-CM

## 2022-04-12 DIAGNOSIS — G8929 Other chronic pain: Secondary | ICD-10-CM | POA: Diagnosis not present

## 2022-04-12 DIAGNOSIS — M48062 Spinal stenosis, lumbar region with neurogenic claudication: Secondary | ICD-10-CM

## 2022-04-12 DIAGNOSIS — R7689 Other specified abnormal immunological findings in serum: Secondary | ICD-10-CM

## 2022-04-12 DIAGNOSIS — M25562 Pain in left knee: Secondary | ICD-10-CM | POA: Diagnosis not present

## 2022-04-12 DIAGNOSIS — R200 Anesthesia of skin: Secondary | ICD-10-CM

## 2022-04-12 DIAGNOSIS — M2550 Pain in unspecified joint: Secondary | ICD-10-CM

## 2022-04-12 DIAGNOSIS — M5136 Other intervertebral disc degeneration, lumbar region: Secondary | ICD-10-CM

## 2022-04-12 NOTE — Patient Instructions (Addendum)
Good to see you Neurosurgery referral  Note excusing jury duty  2 week follow up

## 2022-04-12 NOTE — Telephone Encounter (Signed)
Please let the patient know that her deductible needs to be met. After that she would be able to get Anoro for $47. If she would like for [place] to order Anoro then send script for her. Thanks.

## 2022-04-13 NOTE — Telephone Encounter (Signed)
Spk with patient states she is unable to afford Anoro at this time, will contact pharmacy to see if they have co pay cards that will help with the cost. Nothing further

## 2022-04-13 NOTE — Telephone Encounter (Signed)
Will forward records to Dr. Lamonte Sakai as Juluis Rainier.

## 2022-04-13 NOTE — Telephone Encounter (Signed)
Thank you very much 

## 2022-04-17 ENCOUNTER — Ambulatory Visit: Payer: No Typology Code available for payment source | Admitting: Rheumatology

## 2022-04-18 ENCOUNTER — Encounter: Payer: Self-pay | Admitting: Rheumatology

## 2022-04-18 ENCOUNTER — Ambulatory Visit: Payer: Medicare Other | Attending: Rheumatology | Admitting: Rheumatology

## 2022-04-18 VITALS — BP 121/82 | HR 69 | Resp 18 | Ht 63.75 in | Wt 194.6 lb

## 2022-04-18 DIAGNOSIS — Z87891 Personal history of nicotine dependence: Secondary | ICD-10-CM | POA: Diagnosis present

## 2022-04-18 DIAGNOSIS — R768 Other specified abnormal immunological findings in serum: Secondary | ICD-10-CM | POA: Insufficient documentation

## 2022-04-18 DIAGNOSIS — N281 Cyst of kidney, acquired: Secondary | ICD-10-CM | POA: Insufficient documentation

## 2022-04-18 DIAGNOSIS — M19041 Primary osteoarthritis, right hand: Secondary | ICD-10-CM | POA: Insufficient documentation

## 2022-04-18 DIAGNOSIS — M5136 Other intervertebral disc degeneration, lumbar region: Secondary | ICD-10-CM | POA: Insufficient documentation

## 2022-04-18 DIAGNOSIS — E7800 Pure hypercholesterolemia, unspecified: Secondary | ICD-10-CM | POA: Diagnosis present

## 2022-04-18 DIAGNOSIS — E559 Vitamin D deficiency, unspecified: Secondary | ICD-10-CM | POA: Diagnosis present

## 2022-04-18 DIAGNOSIS — M5030 Other cervical disc degeneration, unspecified cervical region: Secondary | ICD-10-CM | POA: Diagnosis present

## 2022-04-18 DIAGNOSIS — M170 Bilateral primary osteoarthritis of knee: Secondary | ICD-10-CM | POA: Diagnosis present

## 2022-04-18 DIAGNOSIS — J4489 Other specified chronic obstructive pulmonary disease: Secondary | ICD-10-CM | POA: Insufficient documentation

## 2022-04-18 DIAGNOSIS — M19071 Primary osteoarthritis, right ankle and foot: Secondary | ICD-10-CM | POA: Insufficient documentation

## 2022-04-18 DIAGNOSIS — Z9181 History of falling: Secondary | ICD-10-CM | POA: Insufficient documentation

## 2022-04-18 DIAGNOSIS — R200 Anesthesia of skin: Secondary | ICD-10-CM | POA: Insufficient documentation

## 2022-04-18 DIAGNOSIS — R600 Localized edema: Secondary | ICD-10-CM | POA: Diagnosis present

## 2022-04-18 DIAGNOSIS — M19042 Primary osteoarthritis, left hand: Secondary | ICD-10-CM | POA: Diagnosis present

## 2022-04-18 DIAGNOSIS — M19072 Primary osteoarthritis, left ankle and foot: Secondary | ICD-10-CM | POA: Diagnosis present

## 2022-04-18 DIAGNOSIS — Z8719 Personal history of other diseases of the digestive system: Secondary | ICD-10-CM | POA: Diagnosis present

## 2022-04-18 DIAGNOSIS — J449 Chronic obstructive pulmonary disease, unspecified: Secondary | ICD-10-CM

## 2022-04-18 DIAGNOSIS — R29898 Other symptoms and signs involving the musculoskeletal system: Secondary | ICD-10-CM | POA: Insufficient documentation

## 2022-04-18 DIAGNOSIS — F4329 Adjustment disorder with other symptoms: Secondary | ICD-10-CM | POA: Insufficient documentation

## 2022-04-18 DIAGNOSIS — I10 Essential (primary) hypertension: Secondary | ICD-10-CM | POA: Diagnosis present

## 2022-04-18 NOTE — Patient Instructions (Signed)
These get labs 2 weeks prior to your next appointment

## 2022-04-24 NOTE — Progress Notes (Unsigned)
Frances Nelson D.Frances Nelson Phone: [phone]   Assessment and Plan:     There are no diagnoses linked to this encounter.  ***   Pertinent previous records reviewed include ***   Follow Up: ***     Subjective:   I, Frances Nelson, am serving as a Education administrator for Frances Nelson   Chief Complaint: left sided pain    HPI:  10/03/2021 Patient is a 66 year old female complaining of left ankle, left leg, and low back pain. Patient states that her left sided pain started 2015 hasnt been getting any better , Achilles tendonitis bilateral left is more painful, left knee pain , fell down the stairs feb 7th and caught herself her legs were underneath her in a dive position her knee had given out on her. Has picture of her ankle from her fall, low back pain is all the way across pain is mostly in the middle , has numbness and tingling her toes are always numb gets weird sensations , feels like ice cold water is dripping down the back of her leg, gets really sharp pains on her legs and feet, was taking ib for the swelling but has had reactions with it nausea, seems to work in reverse the ib makes the swelling worst, takes tylenol arthritis 650 mg that seems to help a little, when she takes she is able to sleep at night. Pt says she has a lump on her left heel and left knee and a cyst in the back of her knee   10/24/2021 Patient states that today her legs are killing her, had improvement until it got cold again and then the pain came back    10/28/2021 Patient states that she still hs pain in her legs and feet, toes are still numb, beginning of march she fell down the stairs and her left foot 3 great toe down are still bruised look like blood blisters , still getting sharp pains    11/17/2021 Patient states had epidural and some of the swelling is gone and the numbness in legs are gone but the pain is now more intense. Pain  in the shoulders radiating down in both arms to the elbows. These pains have started only since the epidural. Also has had a headache and can get more intense. Sometimes also will get sporadic pain in foot that causes a reflex reaction.   12/09/2021 Patient states that she is walking without her cane the pain is better,  feet and ankle still bother her thinks it more from arthritis,  isn't having the leg pain, was able to lay on the ground for the first time in 6 years, was able to raise single leg got pain when she tried to raise both legs    04/12/2022 Patient states that  she still has left sided pain , also bilateral feet , toes and up to mid calf is numb and hurt 24/7, knee she has a lump that is hard and then she has a baker cyst on the back of her leg    04/25/2022 Patient states    Relevant Historical Information: Positive ANA in 2019, but no recent follow-up with rheumatology, chronic pain in multiple locations, hypertension, COPD  Additional pertinent review of systems negative.   Current Outpatient Medications:    acidophilus (RISAQUAD) CAPS capsule, Take 1 capsule by mouth daily., Disp: , Rfl:    albuterol (PROVENTIL) (2.5 MG/3ML) 0.083%  nebulizer solution, USE 1 VIAL IN NEBULIZER EVERY 6HRS FOR SHORTNESS OF BREATH/WHEEZING, Disp: 300 mL, Rfl: 5   albuterol (VENTOLIN HFA) 108 (90 Base) MCG/ACT inhaler, INHALE 2 PUFFS BY MOUTH EVERY 6 HOURS AS NEEDED FOR WHEEZING/SHORTNESS OF BREATH, Disp: 18 each, Rfl: 11   Calcium-Vitamins C & D (CALCIUM/C/D PO), Take by mouth., Disp: , Rfl:    cetirizine (ZYRTEC) 10 MG tablet, Take 10 mg by mouth daily., Disp: , Rfl:    Cholecalciferol (HM VITAMIN D3) 100 MCG (4000 UT) CAPS, Take 4,000 Units by mouth daily., Disp: , Rfl:    DULoxetine (CYMBALTA) 60 MG capsule, Take 1 capsule (60 mg total) by mouth daily., Disp: 90 capsule, Rfl: 3   gabapentin (NEURONTIN) 100 MG capsule, TAKE 1 CAPSULE (100 MG TOTAL) BY MOUTH THREE TIMES DAILY. (Patient not  taking: Reported on 04/18/2022), Disp: 270 capsule, Rfl: 1   hydrochlorothiazide (HYDRODIURIL) 12.5 MG tablet, Take 1 tablet (12.5 mg total) by mouth daily. (Patient taking differently: Take 12.5 mg by mouth as needed.), Disp: 90 tablet, Rfl: 2   losartan (COZAAR) 25 MG tablet, Take 1 tablet (25 mg total) by mouth daily., Disp: 90 tablet, Rfl: 1   MELATONIN PO, Take by mouth at bedtime., Disp: , Rfl:    meloxicam (MOBIC) 15 MG tablet, Take 0.5 tablets (7.5 mg total) by mouth daily. (Patient taking differently: Take 7.5 mg by mouth daily as needed.), Disp: 60 tablet, Rfl: 0   methocarbamol (ROBAXIN) 750 MG tablet, TAKE 1 TABLET (750 MG TOTAL) BY MOUTH EVERY 6 (SIX) HOURS AS NEEDED FOR MUSCLE SPASMS (NOT COVERED), Disp: 120 tablet, Rfl: 2   mometasone (NASONEX) 50 MCG/ACT nasal spray, Place 2 sprays into the nose daily., Disp: , Rfl:    montelukast (SINGULAIR) 10 MG tablet, TAKE 1 TABLET BY MOUTH EVERYDAY AT BEDTIME, Disp: 90 tablet, Rfl: 3   Multiple Vitamins-Minerals (MULTIVITAMIN WITH MINERALS) tablet, Take 1 tablet by mouth daily., Disp: , Rfl:    omeprazole (PRILOSEC) 40 MG capsule, TAKE 1 CAPSULE (40 MG TOTAL) BY MOUTH IN THE MORNING AND AT BEDTIME., Disp: 180 capsule, Rfl: 3   Objective:     There were no vitals filed for this visit.    There is no height or weight on file to calculate BMI.    Physical Exam:    ***   Electronically signed by:  Frances Nelson D.Marguerita Merles [facility] 12:18 PM 04/24/22

## 2022-04-25 ENCOUNTER — Ambulatory Visit (INDEPENDENT_AMBULATORY_CARE_PROVIDER_SITE_OTHER): Payer: Medicare Other | Admitting: Sports Medicine

## 2022-04-25 VITALS — BP 130/80 | HR 73 | Ht 63.0 in | Wt 194.0 lb

## 2022-04-25 DIAGNOSIS — G8929 Other chronic pain: Secondary | ICD-10-CM | POA: Diagnosis not present

## 2022-04-25 DIAGNOSIS — M1712 Unilateral primary osteoarthritis, left knee: Secondary | ICD-10-CM

## 2022-04-25 DIAGNOSIS — M48062 Spinal stenosis, lumbar region with neurogenic claudication: Secondary | ICD-10-CM

## 2022-04-25 DIAGNOSIS — M25562 Pain in left knee: Secondary | ICD-10-CM | POA: Diagnosis not present

## 2022-04-25 NOTE — Patient Instructions (Addendum)
Good to see you  Recommend reaching out to Neurosurgeon to discuss spinal fusion surgery  1 week follow up for HA injection

## 2022-04-27 ENCOUNTER — Ambulatory Visit: Payer: Medicare Other | Admitting: Sports Medicine

## 2022-05-02 ENCOUNTER — Other Ambulatory Visit: Payer: Self-pay | Admitting: Neurological Surgery

## 2022-05-02 ENCOUNTER — Encounter: Payer: Self-pay | Admitting: Sports Medicine

## 2022-05-04 ENCOUNTER — Ambulatory Visit (INDEPENDENT_AMBULATORY_CARE_PROVIDER_SITE_OTHER): Payer: Medicare Other | Admitting: Sports Medicine

## 2022-05-04 VITALS — BP 138/78 | HR 85 | Ht 63.0 in | Wt 194.0 lb

## 2022-05-04 DIAGNOSIS — M1712 Unilateral primary osteoarthritis, left knee: Secondary | ICD-10-CM | POA: Diagnosis not present

## 2022-05-04 DIAGNOSIS — G8929 Other chronic pain: Secondary | ICD-10-CM

## 2022-05-04 DIAGNOSIS — M25562 Pain in left knee: Secondary | ICD-10-CM

## 2022-05-04 MED ORDER — HYALURONAN 88 MG/4ML IX SOSY
88.0000 mg | PREFILLED_SYRINGE | Freq: Once | INTRA_ARTICULAR | Status: AC
  Administered 2022-05-04: 88 mg via INTRA_ARTICULAR

## 2022-05-04 NOTE — Patient Instructions (Addendum)
Good to see you  8 week follow up

## 2022-05-04 NOTE — Progress Notes (Signed)
Frances Nelson D.[place] Phone: [phone]   Assessment and Plan:     1. Chronic pain of left knee 2. Primary osteoarthritis of left knee  -Chronic with exacerbation, subsequent visit - Patient presents for procedure only visit for HA injection - Continued flare of left knee pain with patient electing for HA injection at today's visit.  Tolerated well per note below  Procedure: Knee Joint Injection Side: Left Indication: Flare of osteoarthritis  Risks explained and consent was given verbally. The site was cleaned with alcohol prep. A needle was introduced with an anterio-lateral approach. Injection given using Monovisc 4 mL (22 mg/mL). This was well tolerated and resulted in symptomatic relief.  Needle was removed, hemostasis achieved, and post injection instructions were explained.   Pt was advised to call or return to clinic if these symptoms worsen or fail to improve as anticipated.   Pertinent previous records reviewed include none   Follow Up: 4 to 6 weeks for reevaluation.   Subjective:   I, Frances Nelson, am serving as a Education administrator for Doctor Glennon Mac   Chief Complaint: left sided pain    HPI:  10/03/2021 Patient is a 66 year old female complaining of left ankle, left leg, and low back pain. Patient states that her left sided pain started 2015 hasnt been getting any better , Achilles tendonitis bilateral left is more painful, left knee pain , fell down the stairs feb 7th and caught herself her legs were underneath her in a dive position her knee had given out on her. Has picture of her ankle from her fall, low back pain is all the way across pain is mostly in the middle , has numbness and tingling her toes are always numb gets weird sensations , feels like ice cold water is dripping down the back of her leg, gets really sharp pains on her legs and feet, was taking ib for the swelling but has had  reactions with it nausea, seems to work in reverse the ib makes the swelling worst, takes tylenol arthritis 650 mg that seems to help a little, when she takes she is able to sleep at night. Pt says she has a lump on her left heel and left knee and a cyst in the back of her knee   10/24/2021 Patient states that today her legs are killing her, had improvement until it got cold again and then the pain came back    10/28/2021 Patient states that she still hs pain in her legs and feet, toes are still numb, beginning of march she fell down the stairs and her left foot 3 great toe down are still bruised look like blood blisters , still getting sharp pains    11/17/2021 Patient states had epidural and some of the swelling is gone and the numbness in legs are gone but the pain is now more intense. Pain in the shoulders radiating down in both arms to the elbows. These pains have started only since the epidural. Also has had a headache and can get more intense. Sometimes also will get sporadic pain in foot that causes a reflex reaction.   12/09/2021 Patient states that she is walking without her cane the pain is better,  feet and ankle still bother her thinks it more from arthritis,  isn't having the leg pain, was able to lay on the ground for the first time in 6 years, was able to raise  single leg got pain when she tried to raise both legs    04/12/2022 Patient states that  she still has left sided pain , also bilateral feet , toes and up to mid calf is numb and hurt 24/7, knee she has a lump that is hard and then she has a baker cyst on the back of her leg    04/25/2022 Patient states she has a pain in the [place] knows it coming from the back today its acting up   05/04/2022 Patient states ready for HA injection , surgeon never got back to her about the injection     Relevant Historical Information: Positive ANA in 2019, but no recent follow-up with rheumatology, chronic pain in multiple locations,  hypertension, COPD    Additional pertinent review of systems negative.   Current Outpatient Medications:    acidophilus (RISAQUAD) CAPS capsule, Take 1 capsule by mouth daily., Disp: , Rfl:    albuterol (PROVENTIL) (2.5 MG/3ML) 0.083% nebulizer solution, USE 1 VIAL IN NEBULIZER EVERY 6HRS FOR SHORTNESS OF BREATH/WHEEZING, Disp: 300 mL, Rfl: 5   albuterol (VENTOLIN HFA) 108 (90 Base) MCG/ACT inhaler, INHALE 2 PUFFS BY MOUTH EVERY 6 HOURS AS NEEDED FOR WHEEZING/SHORTNESS OF BREATH, Disp: 18 each, Rfl: 11   Calcium-Vitamins C & D (CALCIUM/C/D PO), Take by mouth., Disp: , Rfl:    cetirizine (ZYRTEC) 10 MG tablet, Take 10 mg by mouth daily., Disp: , Rfl:    Cholecalciferol (HM VITAMIN D3) 100 MCG (4000 UT) CAPS, Take 4,000 Units by mouth daily., Disp: , Rfl:    DULoxetine (CYMBALTA) 60 MG capsule, Take 1 capsule (60 mg total) by mouth daily., Disp: 90 capsule, Rfl: 3   gabapentin (NEURONTIN) 100 MG capsule, TAKE 1 CAPSULE (100 MG TOTAL) BY MOUTH THREE TIMES DAILY., Disp: 270 capsule, Rfl: 1   hydrochlorothiazide (HYDRODIURIL) 12.5 MG tablet, Take 1 tablet (12.5 mg total) by mouth daily. (Patient taking differently: Take 12.5 mg by mouth as needed.), Disp: 90 tablet, Rfl: 2   losartan (COZAAR) 25 MG tablet, Take 1 tablet (25 mg total) by mouth daily., Disp: 90 tablet, Rfl: 1   MELATONIN PO, Take by mouth at bedtime., Disp: , Rfl:    meloxicam (MOBIC) 15 MG tablet, Take 0.5 tablets (7.5 mg total) by mouth daily. (Patient taking differently: Take 7.5 mg by mouth daily as needed.), Disp: 60 tablet, Rfl: 0   methocarbamol (ROBAXIN) 750 MG tablet, TAKE 1 TABLET (750 MG TOTAL) BY MOUTH EVERY 6 (SIX) HOURS AS NEEDED FOR MUSCLE SPASMS (NOT COVERED), Disp: 120 tablet, Rfl: 2   mometasone (NASONEX) 50 MCG/ACT nasal spray, Place 2 sprays into the nose daily., Disp: , Rfl:    montelukast (SINGULAIR) 10 MG tablet, TAKE 1 TABLET BY MOUTH EVERYDAY AT BEDTIME, Disp: 90 tablet, Rfl: 3   Multiple Vitamins-Minerals  (MULTIVITAMIN WITH MINERALS) tablet, Take 1 tablet by mouth daily., Disp: , Rfl:    omeprazole (PRILOSEC) 40 MG capsule, TAKE 1 CAPSULE (40 MG TOTAL) BY MOUTH IN THE MORNING AND AT BEDTIME., Disp: 180 capsule, Rfl: 3   Objective:     Vitals:   05/04/22 1059  BP: 138/78  Pulse: 85  SpO2: 96%  Weight: 194 lb (88 kg)  Height: '5\' 3"'$  (1.6 m)      Body mass index is 34.37 kg/m.       Electronically signed by:  Frances Nelson D.[facility] 11:19 AM 05/04/22

## 2022-05-10 NOTE — Progress Notes (Signed)
Surgical Instructions    Your procedure is scheduled on Thursday, 05/18/22.  Report to [facility] Main Entrance "A" at 5:30 A.M., then check in with the Admitting office.  Call this number if you have problems the morning of surgery:  [phone]   If you have any questions prior to your surgery date call [phone]: Open Monday-Friday 8am-4pm If you experience any cold or flu symptoms such as cough, fever, chills, shortness of breath, etc. between now and your scheduled surgery, please notify us at the above number     Remember:  Do not eat after midnight the night before your surgery  You may drink clear liquids until 4:30am the morning of your surgery.   Clear liquids allowed are: Water, Non-Citrus Juices (without pulp), Carbonated Beverages, Clear Tea, Black Coffee ONLY (NO MILK, CREAM OR POWDERED CREAMER of any kind), and Gatorade    Take these medicines the morning of surgery with A SIP OF WATER:  acetaminophen (TYLENOL) cetirizine (ZYRTEC) omeprazole (PRILOSEC)   IF NEEDED: albuterol (PROVENTIL) nebulizer/inhaler- bring inhalers with you the day of surgery methocarbamol (ROBAXIN)  As of today, STOP taking any Aspirin (unless otherwise instructed by your surgeon) Aleve, Naproxen, Ibuprofen, Motrin, Advil, Goody's, BC's, all herbal medications, fish oil, meloxicam (MOBIC) and all vitamins.           Do not wear jewelry or makeup. Do not wear lotions, powders, perfumes or deodorant. Do not shave 48 hours prior to surgery.   Do not bring valuables to the hospital. Do not wear nail polish, gel polish, artificial nails, or any other type of covering on natural nails (fingers and toes) If you have artificial nails or gel coating that need to be removed by a nail salon, please have this removed prior to surgery. Artificial nails or gel coating may interfere with anesthesia's ability to adequately monitor your vital signs.  [facility] is not responsible for any belongings or  valuables.    Do NOT Smoke (Tobacco/Vaping)  24 hours prior to your procedure  If you use a CPAP at night, you may bring your mask for your overnight stay.   Contacts, glasses, hearing aids, dentures or partials may not be worn into surgery, please bring cases for these belongings   For patients admitted to the hospital, discharge time will be determined by your treatment team.   Patients discharged the day of surgery will not be allowed to drive home, and someone needs to stay with them for 24 hours.   SURGICAL WAITING ROOM VISITATION Patients having surgery or a procedure may have no more than 2 support people in the waiting area - these visitors may rotate.   Children under the age of 15 must have an adult with them who is not the patient. If the patient needs to stay at the hospital during part of their recovery, the visitor guidelines for inpatient rooms apply. Pre-op nurse will coordinate an appropriate time for 1 support person to accompany patient in pre-op.  This support person may not rotate.   Please refer to [email or web address] for the visitor guidelines for Inpatients (after your surgery is over and you are in a regular room).    Special instructions:    Oral Hygiene is also important to reduce your risk of infection.  Remember - BRUSH YOUR TEETH THE MORNING OF SURGERY WITH YOUR REGULAR TOOTHPASTE   [place]- Preparing For Surgery  Before surgery, you can play an important role. Because skin is not sterile, your skin needs  to be as free of germs as possible. You can reduce the number of germs on your skin by washing with CHG (chlorahexidine gluconate) Soap before surgery.  CHG is an antiseptic cleaner which kills germs and bonds with the skin to continue killing germs even after washing.     Please do not use if you have an allergy to CHG or antibacterial soaps. If your skin becomes reddened/irritated stop using the  CHG.  Do not shave (including legs and underarms) for at least 48 hours prior to first CHG shower. It is OK to shave your face.  Please follow these instructions carefully.     Shower the NIGHT BEFORE SURGERY and the MORNING OF SURGERY with CHG Soap.   If you chose to wash your hair, wash your hair first as usual with your normal shampoo. After you shampoo, rinse your hair and body thoroughly to remove the shampoo.  Then [employer] and genitals (private parts) with your normal soap and rinse thoroughly to remove soap.  After that Use CHG Soap as you would any other liquid soap. You can apply CHG directly to the skin and wash gently with a scrungie or a clean washcloth.   Apply the CHG Soap to your body ONLY FROM THE NECK DOWN.  Do not use on open wounds or open sores. Avoid contact with your eyes, ears, mouth and genitals (private parts). Wash Face and genitals (private parts)  with your normal soap.   Wash thoroughly, paying special attention to the area where your surgery will be performed.  Thoroughly rinse your body with warm water from the neck down.  DO NOT shower/wash with your normal soap after using and rinsing off the CHG Soap.  Pat yourself dry with a CLEAN TOWEL.  Wear CLEAN PAJAMAS to bed the night before surgery  Place CLEAN SHEETS on your bed the night before your surgery  DO NOT SLEEP WITH PETS.   Day of Surgery: Take a shower with CHG soap. Wear Clean/Comfortable clothing the morning of surgery Do not apply any deodorants/lotions.   Remember to brush your teeth WITH YOUR REGULAR TOOTHPASTE.    If you received a COVID test during your pre-op visit, it is requested that you wear a mask when out in public, stay away from anyone that may not be feeling well, and notify your surgeon if you develop symptoms. If you have been in contact with anyone that has tested positive in the last 10 days, please notify your surgeon.    Please read over the following fact sheets  that you were given.

## 2022-05-11 ENCOUNTER — Encounter (HOSPITAL_COMMUNITY)
Admission: RE | Admit: 2022-05-11 | Discharge: 2022-05-11 | Disposition: A | Discharge status: Home / Self Care | Payer: Medicare Other | Source: Ambulatory Visit | Attending: Neurological Surgery | Admitting: Neurological Surgery

## 2022-05-11 ENCOUNTER — Other Ambulatory Visit: Payer: Self-pay

## 2022-05-11 ENCOUNTER — Ambulatory Visit: Payer: No Typology Code available for payment source | Admitting: Rheumatology

## 2022-05-11 ENCOUNTER — Encounter (HOSPITAL_COMMUNITY): Payer: Self-pay

## 2022-05-11 VITALS — BP 126/89 | HR 86 | Temp 97.8°F | Resp 18 | Ht 63.0 in | Wt 191.9 lb

## 2022-05-11 DIAGNOSIS — Z01818 Encounter for other preprocedural examination: Secondary | ICD-10-CM | POA: Insufficient documentation

## 2022-05-11 HISTORY — DX: Gastro-esophageal reflux disease without esophagitis: K21.9

## 2022-05-11 HISTORY — DX: Chronic obstructive pulmonary disease, unspecified: J44.9

## 2022-05-11 HISTORY — DX: Unspecified osteoarthritis, unspecified site: M19.90

## 2022-05-11 HISTORY — DX: Pneumonia, unspecified organism: J18.9

## 2022-05-11 HISTORY — DX: Essential (primary) hypertension: I10

## 2022-05-11 LAB — CBC
HCT: 47.5 % — ABNORMAL HIGH (ref 36.0–46.0)
Hemoglobin: 15.7 g/dL — ABNORMAL HIGH (ref 12.0–15.0)
MCH: 30.8 pg (ref 26.0–34.0)
MCHC: 33.1 g/dL (ref 30.0–36.0)
MCV: 93.1 fL (ref 80.0–100.0)
Platelets: 353 10*3/uL (ref 150–400)
RBC: 5.1 MIL/uL (ref 3.87–5.11)
RDW: 12.9 % (ref 11.5–15.5)
WBC: 8.2 10*3/uL (ref 4.0–10.5)
nRBC: 0 % (ref 0.0–0.2)

## 2022-05-11 LAB — BASIC METABOLIC PANEL
Anion gap: 10 (ref 5–15)
BUN: 18 mg/dL (ref 8–23)
CO2: 26 mmol/L (ref 22–32)
Calcium: 10.1 mg/dL (ref 8.9–10.3)
Chloride: 101 mmol/L (ref 98–111)
Creatinine, Ser: 0.66 mg/dL (ref 0.44–1.00)
GFR, Estimated: >60 mL/min (ref >60)
Glucose, Bld: 93 mg/dL (ref 70–99)
Potassium: 4.1 mmol/L (ref 3.5–5.1)
Sodium: 137 mmol/L (ref 135–145)

## 2022-05-11 LAB — TYPE AND SCREEN
ABO/RH(D): A POS
Antibody Screen: NEGATIVE

## 2022-05-11 LAB — SURGICAL PCR SCREEN
MRSA, PCR: NEGATIVE
Staphylococcus aureus: NEGATIVE

## 2022-05-11 NOTE — Progress Notes (Signed)
PCP - Cathlean Cower, MD Cardiologist - denies  PPM/ICD - denies Device Orders - n/a Rep Notified - n/a  Chest x-ray - n/a EKG - 05/11/2022 Stress Test - denies  ECHO - denies Cardiac Cath - denies  Sleep Study - denies CPAP - n/a  Fasting Blood Sugar - n/a  Blood Thinner Instructions: n/a Aspirin Instructions: Patient was instructed: As of today, STOP taking any Aspirin (unless otherwise instructed by your surgeon) Aleve, Naproxen, Ibuprofen, Motrin, Advil, Goody's, BC's, all herbal medications, fish oil, meloxicam (MOBIC) and all vitamins.  ERAS Protcol - yes, until 04:30 o'clock  COVID TEST- n/a   Anesthesia review: no  Patient denies shortness of breath, fever, cough and chest pain at PAT appointment   All instructions explained to the patient, with a verbal understanding of the material. Patient agrees to go over the instructions while at home for a better understanding. Patient also instructed to self quarantine after being tested for COVID-19. The opportunity to ask questions was provided.

## 2022-05-18 ENCOUNTER — Other Ambulatory Visit: Payer: Self-pay

## 2022-05-18 ENCOUNTER — Encounter (HOSPITAL_COMMUNITY): Payer: Self-pay | Admitting: Neurological Surgery

## 2022-05-18 ENCOUNTER — Ambulatory Visit (HOSPITAL_BASED_OUTPATIENT_CLINIC_OR_DEPARTMENT_OTHER): Payer: Medicare Other | Admitting: Certified Registered Nurse Anesthetist

## 2022-05-18 ENCOUNTER — Observation Stay (HOSPITAL_COMMUNITY)
Admission: RE | Admit: 2022-05-18 | Discharge: 2022-05-19 | Disposition: A | Discharge status: Home Health | Payer: Medicare Other | Attending: Neurological Surgery | Admitting: Neurological Surgery

## 2022-05-18 ENCOUNTER — Encounter (HOSPITAL_COMMUNITY)
Admission: RE | Disposition: A | Discharge status: Home Health | Payer: Self-pay | Source: Home / Self Care | Attending: Neurological Surgery

## 2022-05-18 ENCOUNTER — Observation Stay (HOSPITAL_COMMUNITY): Payer: Medicare Other

## 2022-05-18 ENCOUNTER — Ambulatory Visit (HOSPITAL_COMMUNITY): Payer: Medicare Other

## 2022-05-18 ENCOUNTER — Ambulatory Visit (HOSPITAL_COMMUNITY): Payer: Medicare Other | Admitting: Certified Registered Nurse Anesthetist

## 2022-05-18 DIAGNOSIS — M48061 Spinal stenosis, lumbar region without neurogenic claudication: Secondary | ICD-10-CM

## 2022-05-18 DIAGNOSIS — J449 Chronic obstructive pulmonary disease, unspecified: Secondary | ICD-10-CM | POA: Diagnosis not present

## 2022-05-18 DIAGNOSIS — F17210 Nicotine dependence, cigarettes, uncomplicated: Secondary | ICD-10-CM | POA: Insufficient documentation

## 2022-05-18 DIAGNOSIS — M4316 Spondylolisthesis, lumbar region: Secondary | ICD-10-CM | POA: Diagnosis not present

## 2022-05-18 DIAGNOSIS — J45909 Unspecified asthma, uncomplicated: Secondary | ICD-10-CM | POA: Insufficient documentation

## 2022-05-18 DIAGNOSIS — I10 Essential (primary) hypertension: Secondary | ICD-10-CM | POA: Insufficient documentation

## 2022-05-18 DIAGNOSIS — M48062 Spinal stenosis, lumbar region with neurogenic claudication: Secondary | ICD-10-CM

## 2022-05-18 DIAGNOSIS — Z01818 Encounter for other preprocedural examination: Secondary | ICD-10-CM

## 2022-05-18 DIAGNOSIS — F17200 Nicotine dependence, unspecified, uncomplicated: Secondary | ICD-10-CM

## 2022-05-18 HISTORY — PX: TRANSFORAMINAL LUMBAR INTERBODY FUSION (TLIF) WITH PEDICLE SCREW FIXATION 1 LEVEL: SHX6141

## 2022-05-18 LAB — ABO/RH: ABO/RH(D): A POS

## 2022-05-18 SURGERY — TRANSFORAMINAL LUMBAR INTERBODY FUSION (TLIF) WITH PEDICLE SCREW FIXATION 1 LEVEL
Anesthesia: General

## 2022-05-18 MED ORDER — CHLORHEXIDINE GLUCONATE CLOTH 2 % EX PADS: 6.0000 | MEDICATED_PAD | Freq: Once | CUTANEOUS | Status: DC

## 2022-05-18 MED ORDER — LOSARTAN POTASSIUM 50 MG PO TABS
25.0000 mg | ORAL_TABLET | Freq: Every morning | ORAL | Status: DC
  Administered 2022-05-19: 25 mg via ORAL
  Filled 2022-05-18: qty 1

## 2022-05-18 MED ORDER — MONTELUKAST SODIUM 10 MG PO TABS
10.0000 mg | ORAL_TABLET | Freq: Every day | ORAL | Status: DC
  Administered 2022-05-18: 10 mg via ORAL
  Filled 2022-05-18: qty 1

## 2022-05-18 MED ORDER — LORATADINE 10 MG PO TABS: 10.0000 mg | ORAL_TABLET | Freq: Every day | ORAL | Status: DC

## 2022-05-18 MED ORDER — LACTATED RINGERS IV SOLN: INTRAVENOUS | Status: DC

## 2022-05-18 MED ORDER — HYDROMORPHONE HCL 1 MG/ML IJ SOLN: 0.2500 mg | INTRAMUSCULAR | Status: DC | PRN

## 2022-05-18 MED ORDER — ACETAMINOPHEN 650 MG RE SUPP: 650.0000 mg | RECTAL | Status: DC | PRN

## 2022-05-18 MED ORDER — ROCURONIUM BROMIDE 10 MG/ML (PF) SYRINGE
PREFILLED_SYRINGE | INTRAVENOUS | Status: DC | PRN
  Administered 2022-05-18: 20 mg via INTRAVENOUS
  Administered 2022-05-18 (×3): 10 mg via INTRAVENOUS
  Administered 2022-05-18: 50 mg via INTRAVENOUS

## 2022-05-18 MED ORDER — 0.9 % SODIUM CHLORIDE (POUR BTL) OPTIME
TOPICAL | Status: DC | PRN
  Administered 2022-05-18: 1000 mL

## 2022-05-18 MED ORDER — ONDANSETRON HCL 4 MG/2ML IJ SOLN
INTRAMUSCULAR | Status: DC | PRN
  Administered 2022-05-18: 4 mg via INTRAVENOUS

## 2022-05-18 MED ORDER — LIDOCAINE-EPINEPHRINE 1 %-1:100000 IJ SOLN
INTRAMUSCULAR | Status: DC | PRN
  Administered 2022-05-18: 10 mL

## 2022-05-18 MED ORDER — DIPHENHYDRAMINE HCL 50 MG/ML IJ SOLN
INTRAMUSCULAR | Status: DC | PRN
  Administered 2022-05-18: 10 mg via INTRAVENOUS

## 2022-05-18 MED ORDER — SODIUM CHLORIDE 0.9 % IV SOLN
250.0000 mL | INTRAVENOUS | Status: DC
  Administered 2022-05-18: 250 mL via INTRAVENOUS

## 2022-05-18 MED ORDER — PHENOL 1.4 % MT LIQD: 1.0000 | OROMUCOSAL | Status: DC | PRN

## 2022-05-18 MED ORDER — ONDANSETRON HCL 4 MG/2ML IJ SOLN
INTRAMUSCULAR | Status: AC
  Filled 2022-05-18: qty 2

## 2022-05-18 MED ORDER — PHENYLEPHRINE HCL (PRESSORS) 10 MG/ML IV SOLN
INTRAVENOUS | Status: AC
  Filled 2022-05-18: qty 1

## 2022-05-18 MED ORDER — SODIUM CHLORIDE (PF) 0.9 % IJ SOLN
INTRAMUSCULAR | Status: AC
  Filled 2022-05-18: qty 10

## 2022-05-18 MED ORDER — FLUTICASONE PROPIONATE 50 MCG/ACT NA SUSP
2.0000 | Freq: Every day | NASAL | Status: DC
  Filled 2022-05-18: qty 16

## 2022-05-18 MED ORDER — ALBUTEROL SULFATE HFA 108 (90 BASE) MCG/ACT IN AERS: 1.0000 | INHALATION_SPRAY | Freq: Four times a day (QID) | RESPIRATORY_TRACT | Status: DC | PRN

## 2022-05-18 MED ORDER — SUGAMMADEX SODIUM 200 MG/2ML IV SOLN
INTRAVENOUS | Status: DC | PRN
  Administered 2022-05-18: 200 mg via INTRAVENOUS

## 2022-05-18 MED ORDER — SUFENTANIL CITRATE 50 MCG/ML IV SOLN
INTRAVENOUS | Status: DC | PRN
  Administered 2022-05-18: 5 ug via INTRAVENOUS
  Administered 2022-05-18 (×2): 10 ug via INTRAVENOUS
  Administered 2022-05-18: 5 ug via INTRAVENOUS

## 2022-05-18 MED ORDER — ONDANSETRON HCL 4 MG PO TABS
4.0000 mg | ORAL_TABLET | Freq: Four times a day (QID) | ORAL | Status: DC | PRN
  Administered 2022-05-19: 4 mg via ORAL
  Filled 2022-05-18: qty 1

## 2022-05-18 MED ORDER — PHENYLEPHRINE HCL-NACL 20-0.9 MG/250ML-% IV SOLN
INTRAVENOUS | Status: DC | PRN
  Administered 2022-05-18: 20 ug/min via INTRAVENOUS

## 2022-05-18 MED ORDER — HYDROMORPHONE HCL 1 MG/ML IJ SOLN
0.2500 mg | INTRAMUSCULAR | Status: DC | PRN
  Administered 2022-05-18: 0.5 mg via INTRAVENOUS

## 2022-05-18 MED ORDER — OXYCODONE HCL 5 MG PO TABS
10.0000 mg | ORAL_TABLET | ORAL | Status: DC | PRN
  Administered 2022-05-18 – 2022-05-19 (×3): 10 mg via ORAL
  Filled 2022-05-18 (×3): qty 2

## 2022-05-18 MED ORDER — DULOXETINE HCL 30 MG PO CPEP
60.0000 mg | ORAL_CAPSULE | Freq: Every day | ORAL | Status: DC
  Administered 2022-05-18: 60 mg via ORAL
  Filled 2022-05-18: qty 2

## 2022-05-18 MED ORDER — ALBUMIN HUMAN 5 % IV SOLN: INTRAVENOUS | Status: DC | PRN

## 2022-05-18 MED ORDER — THROMBIN 5000 UNITS EX SOLR
OROMUCOSAL | Status: DC | PRN
  Administered 2022-05-18: 5 mL via TOPICAL

## 2022-05-18 MED ORDER — MELATONIN 5 MG PO TABS: 5.0000 mg | ORAL_TABLET | Freq: Every evening | ORAL | Status: DC | PRN

## 2022-05-18 MED ORDER — SODIUM CHLORIDE 0.9% FLUSH
3.0000 mL | Freq: Two times a day (BID) | INTRAVENOUS | Status: DC
  Administered 2022-05-18 (×2): 3 mL via INTRAVENOUS

## 2022-05-18 MED ORDER — ONDANSETRON HCL 4 MG/2ML IJ SOLN
4.0000 mg | Freq: Four times a day (QID) | INTRAMUSCULAR | Status: DC | PRN
  Administered 2022-05-18: 4 mg via INTRAVENOUS
  Filled 2022-05-18: qty 2

## 2022-05-18 MED ORDER — HYDROCHLOROTHIAZIDE 12.5 MG PO TABS: 12.5000 mg | ORAL_TABLET | Freq: Every day | ORAL | Status: DC | PRN

## 2022-05-18 MED ORDER — HYDROXYZINE HCL 50 MG/ML IM SOLN: 50.0000 mg | Freq: Four times a day (QID) | INTRAMUSCULAR | Status: DC | PRN

## 2022-05-18 MED ORDER — EPHEDRINE SULFATE-NACL 50-0.9 MG/10ML-% IV SOSY
PREFILLED_SYRINGE | INTRAVENOUS | Status: DC | PRN
  Administered 2022-05-18 (×2): 5 mg via INTRAVENOUS

## 2022-05-18 MED ORDER — CEFAZOLIN SODIUM-DEXTROSE 2-4 GM/100ML-% IV SOLN
2.0000 g | Freq: Three times a day (TID) | INTRAVENOUS | Status: AC
  Administered 2022-05-18 (×2): 2 g via INTRAVENOUS
  Filled 2022-05-18 (×2): qty 100

## 2022-05-18 MED ORDER — PHENYLEPHRINE 80 MCG/ML (10ML) SYRINGE FOR IV PUSH (FOR BLOOD PRESSURE SUPPORT)
PREFILLED_SYRINGE | INTRAVENOUS | Status: DC | PRN
  Administered 2022-05-18 (×2): 80 ug via INTRAVENOUS
  Administered 2022-05-18: 160 ug via INTRAVENOUS
  Administered 2022-05-18 (×2): 80 ug via INTRAVENOUS

## 2022-05-18 MED ORDER — ROCURONIUM BROMIDE 10 MG/ML (PF) SYRINGE
PREFILLED_SYRINGE | INTRAVENOUS | Status: AC
  Filled 2022-05-18: qty 10

## 2022-05-18 MED ORDER — PROPOFOL 10 MG/ML IV BOLUS
INTRAVENOUS | Status: AC
  Filled 2022-05-18: qty 20

## 2022-05-18 MED ORDER — MENTHOL 3 MG MT LOZG: 1.0000 | LOZENGE | OROMUCOSAL | Status: DC | PRN

## 2022-05-18 MED ORDER — METHOCARBAMOL 500 MG PO TABS
500.0000 mg | ORAL_TABLET | Freq: Four times a day (QID) | ORAL | Status: DC | PRN
  Administered 2022-05-18 – 2022-05-19 (×2): 500 mg via ORAL
  Filled 2022-05-18 (×2): qty 1

## 2022-05-18 MED ORDER — ALBUTEROL SULFATE (2.5 MG/3ML) 0.083% IN NEBU: 2.5000 mg | INHALATION_SOLUTION | Freq: Four times a day (QID) | RESPIRATORY_TRACT | Status: DC | PRN

## 2022-05-18 MED ORDER — CHLORHEXIDINE GLUCONATE 0.12 % MT SOLN
15.0000 mL | Freq: Once | OROMUCOSAL | Status: AC
  Administered 2022-05-18: 15 mL via OROMUCOSAL
  Filled 2022-05-18: qty 15

## 2022-05-18 MED ORDER — LIDOCAINE 2% (20 MG/ML) 5 ML SYRINGE
INTRAMUSCULAR | Status: AC
  Filled 2022-05-18: qty 5

## 2022-05-18 MED ORDER — PROPOFOL 10 MG/ML IV BOLUS
INTRAVENOUS | Status: DC | PRN
  Administered 2022-05-18: 160 mg via INTRAVENOUS

## 2022-05-18 MED ORDER — PANTOPRAZOLE SODIUM 40 MG PO TBEC
40.0000 mg | DELAYED_RELEASE_TABLET | Freq: Every day | ORAL | Status: DC
  Administered 2022-05-19: 40 mg via ORAL
  Filled 2022-05-18: qty 1

## 2022-05-18 MED ORDER — ACETAMINOPHEN 325 MG PO TABS
650.0000 mg | ORAL_TABLET | ORAL | Status: DC | PRN
  Administered 2022-05-18 – 2022-05-19 (×2): 650 mg via ORAL
  Filled 2022-05-18 (×2): qty 2

## 2022-05-18 MED ORDER — LIDOCAINE-EPINEPHRINE 1 %-1:100000 IJ SOLN
INTRAMUSCULAR | Status: AC
  Filled 2022-05-18: qty 1

## 2022-05-18 MED ORDER — DIPHENHYDRAMINE HCL 50 MG/ML IJ SOLN
INTRAMUSCULAR | Status: AC
  Filled 2022-05-18: qty 1

## 2022-05-18 MED ORDER — CYCLOBENZAPRINE HCL 10 MG PO TABS: 10.0000 mg | ORAL_TABLET | Freq: Three times a day (TID) | ORAL | Status: DC | PRN

## 2022-05-18 MED ORDER — LIDOCAINE 2% (20 MG/ML) 5 ML SYRINGE
INTRAMUSCULAR | Status: DC | PRN
  Administered 2022-05-18: 80 mg via INTRAVENOUS

## 2022-05-18 MED ORDER — SUFENTANIL CITRATE 50 MCG/ML IV SOLN
INTRAVENOUS | Status: AC
  Filled 2022-05-18: qty 1

## 2022-05-18 MED ORDER — HYDROMORPHONE HCL 1 MG/ML IJ SOLN
INTRAMUSCULAR | Status: AC
  Filled 2022-05-18: qty 1

## 2022-05-18 MED ORDER — MIDAZOLAM HCL 2 MG/2ML IJ SOLN
INTRAMUSCULAR | Status: AC
  Filled 2022-05-18: qty 2

## 2022-05-18 MED ORDER — POLYETHYLENE GLYCOL 3350 17 G PO PACK: 17.0000 g | PACK | Freq: Every day | ORAL | Status: DC | PRN

## 2022-05-18 MED ORDER — HYDROMORPHONE HCL 1 MG/ML IJ SOLN: 1.0000 mg | INTRAMUSCULAR | Status: DC | PRN

## 2022-05-18 MED ORDER — OXYCODONE HCL 5 MG PO TABS
5.0000 mg | ORAL_TABLET | ORAL | Status: DC | PRN
  Administered 2022-05-18 (×2): 5 mg via ORAL
  Filled 2022-05-18 (×2): qty 1

## 2022-05-18 MED ORDER — VANCOMYCIN HCL IN DEXTROSE 1-5 GM/200ML-% IV SOLN
1000.0000 mg | INTRAVENOUS | Status: AC
  Administered 2022-05-18: 1000 mg via INTRAVENOUS
  Filled 2022-05-18: qty 200

## 2022-05-18 MED ORDER — SODIUM CHLORIDE 0.9% FLUSH: 3.0000 mL | INTRAVENOUS | Status: DC | PRN

## 2022-05-18 MED ORDER — THROMBIN 5000 UNITS EX SOLR
CUTANEOUS | Status: AC
  Filled 2022-05-18: qty 5000

## 2022-05-18 MED ORDER — DOCUSATE SODIUM 100 MG PO CAPS
100.0000 mg | ORAL_CAPSULE | Freq: Two times a day (BID) | ORAL | Status: DC
  Administered 2022-05-18 – 2022-05-19 (×3): 100 mg via ORAL
  Filled 2022-05-18 (×2): qty 1

## 2022-05-18 MED ORDER — RISAQUAD PO CAPS
1.0000 | ORAL_CAPSULE | Freq: Every morning | ORAL | Status: DC
  Administered 2022-05-19: 1 via ORAL
  Filled 2022-05-18: qty 1

## 2022-05-18 MED ORDER — ORAL CARE MOUTH RINSE: 15.0000 mL | Freq: Once | OROMUCOSAL | Status: AC

## 2022-05-18 SURGICAL SUPPLY — 77 items
ADH SKN CLS APL DERMABOND .7 (GAUZE/BANDAGES/DRESSINGS) ×1
APL SKNCLS STERI-STRIP NONHPOA (GAUZE/BANDAGES/DRESSINGS)
BAG COUNTER SPONGE SURGICOUNT (BAG) ×1 IMPLANT
BAG SPNG CNTER NS LX DISP (BAG) ×2
BAND INSRT 18 STRL LF DISP RB (MISCELLANEOUS)
BAND RUBBER #18 3X1/16 STRL (MISCELLANEOUS) ×2 IMPLANT
BASKET BONE COLLECTION (BASKET) ×1 IMPLANT
BENZOIN TINCTURE PRP APPL 2/3 (GAUZE/BANDAGES/DRESSINGS) IMPLANT
BLADE BONE MILL MEDIUM (MISCELLANEOUS) IMPLANT
BLADE CLIPPER SURG (BLADE) IMPLANT
BLADE SURG 11 STRL SS (BLADE) ×1 IMPLANT
BUR 14 MATCH 3 (BUR) IMPLANT
BUR MATCHSTICK NEURO 3.0 LAGG (BURR) ×1 IMPLANT
BUR MR8 14CM BALL SYMTRI 5 (BUR) IMPLANT
BUR PRECISION FLUTE 5.0 (BURR) ×1 IMPLANT
BURR 14 MATCH 3 (BUR) ×1
BURR MR8 14CM BALL SYMTRI 5 (BUR) ×1
CAGE EXP CATALYFT 9 (Plate) IMPLANT
CANISTER SUCT 3000ML PPV (MISCELLANEOUS) ×1 IMPLANT
CNTNR URN SCR LID CUP LEK RST (MISCELLANEOUS) ×1 IMPLANT
CONT SPEC 4OZ STRL OR WHT (MISCELLANEOUS) ×1
COVER BACK TABLE 60X90IN (DRAPES) ×1 IMPLANT
COVERAGE SUPPORT O-ARM STEALTH (MISCELLANEOUS) ×1 IMPLANT
DERMABOND ADVANCED .7 DNX12 (GAUZE/BANDAGES/DRESSINGS) ×1 IMPLANT
DRAPE C-ARM 42X72 X-RAY (DRAPES) IMPLANT
DRAPE C-ARMOR (DRAPES) IMPLANT
DRAPE LAPAROTOMY 100X72X124 (DRAPES) ×1 IMPLANT
DRAPE MICROSCOPE SLANT 54X150 (MISCELLANEOUS) ×1 IMPLANT
DRAPE SHEET LG 3/4 BI-LAMINATE (DRAPES) ×4 IMPLANT
DRAPE SURG 17X23 STRL (DRAPES) ×1 IMPLANT
DURAPREP 26ML APPLICATOR (WOUND CARE) ×1 IMPLANT
ELECT REM PT RETURN 9FT ADLT (ELECTROSURGICAL) ×1
ELECTRODE REM PT RTRN 9FT ADLT (ELECTROSURGICAL) ×1 IMPLANT
FEE COVERAGE SUPPORT O-ARM (MISCELLANEOUS) ×1 IMPLANT
GAUZE 4X4 16PLY ~~LOC~~+RFID DBL (SPONGE) IMPLANT
GAUZE SPONGE 4X4 12PLY STRL (GAUZE/BANDAGES/DRESSINGS) IMPLANT
GLOVE BIOGEL PI IND STRL 7.5 (GLOVE) ×2 IMPLANT
GLOVE ECLIPSE 7.5 STRL STRAW (GLOVE) ×2 IMPLANT
GLOVE EXAM NITRILE LRG STRL (GLOVE) IMPLANT
GLOVE EXAM NITRILE XL STR (GLOVE) IMPLANT
GLOVE EXAM NITRILE XS STR PU (GLOVE) IMPLANT
GOWN STRL REUS W/ TWL LRG LVL3 (GOWN DISPOSABLE) ×4 IMPLANT
GOWN STRL REUS W/ TWL XL LVL3 (GOWN DISPOSABLE) IMPLANT
GOWN STRL REUS W/TWL 2XL LVL3 (GOWN DISPOSABLE) IMPLANT
GOWN STRL REUS W/TWL LRG LVL3 (GOWN DISPOSABLE) ×4
GOWN STRL REUS W/TWL XL LVL3 (GOWN DISPOSABLE)
HEMOSTAT POWDER KIT SURGIFOAM (HEMOSTASIS) ×1 IMPLANT
KIT BASIN OR (CUSTOM PROCEDURE TRAY) ×1 IMPLANT
KIT POSITION SURG JACKSON T1 (MISCELLANEOUS) ×1 IMPLANT
KIT TURNOVER KIT B (KITS) ×1 IMPLANT
MARKER SPHERE PSV REFLC NDI (MISCELLANEOUS) ×5 IMPLANT
MILL BONE PREP (MISCELLANEOUS) IMPLANT
NDL HYPO 18GX1.5 BLUNT FILL (NEEDLE) IMPLANT
NDL SPNL 18GX3.5 QUINCKE PK (NEEDLE) IMPLANT
NEEDLE HYPO 18GX1.5 BLUNT FILL (NEEDLE) IMPLANT
NEEDLE HYPO 22GX1.5 SAFETY (NEEDLE) ×1 IMPLANT
NEEDLE SPNL 18GX3.5 QUINCKE PK (NEEDLE) IMPLANT
NS IRRIG 1000ML POUR BTL (IV SOLUTION) ×1 IMPLANT
PACK LAMINECTOMY NEURO (CUSTOM PROCEDURE TRAY) ×1 IMPLANT
PAD ARMBOARD 7.5X6 YLW CONV (MISCELLANEOUS) ×3 IMPLANT
PIN BONE FIX 150 (PIN) IMPLANT
ROD SPINAL 5.5X35 CP 4 TI (Rod) IMPLANT
SCREW MAS/SET STERILE 4PK (Screw) IMPLANT
SCREW SHANK NL 6.5X40 (Screw) IMPLANT
SPIKE FLUID TRANSFER (MISCELLANEOUS) ×1 IMPLANT
SPONGE SURGIFOAM ABS GEL 100 (HEMOSTASIS) IMPLANT
SPONGE T-LAP 4X18 ~~LOC~~+RFID (SPONGE) IMPLANT
STRIP CLOSURE SKIN 1/2X4 (GAUZE/BANDAGES/DRESSINGS) IMPLANT
SUT MNCRL AB 3-0 PS2 18 (SUTURE) ×1 IMPLANT
SUT VIC AB 0 CT1 18XCR BRD8 (SUTURE) ×1 IMPLANT
SUT VIC AB 0 CT1 8-18 (SUTURE) ×1
SUT VIC AB 2-0 CP2 18 (SUTURE) ×1 IMPLANT
SYR 3ML LL SCALE MARK (SYRINGE) IMPLANT
TOWEL GREEN STERILE (TOWEL DISPOSABLE) ×1 IMPLANT
TOWEL GREEN STERILE FF (TOWEL DISPOSABLE) ×1 IMPLANT
TRAY FOLEY MTR SLVR 16FR STAT (SET/KITS/TRAYS/PACK) ×1 IMPLANT
WATER STERILE IRR 1000ML POUR (IV SOLUTION) ×1 IMPLANT

## 2022-05-18 NOTE — Anesthesia Postprocedure Evaluation (Signed)
Anesthesia Post Note  Patient: Frances Nelson  Procedure(s) Performed: Lumbar Four-Five Open Laminectomy/Transforaminal Lumbar Interbody Fusion/Posterolateral fusion Application of O-Arm     Patient location during evaluation: PACU Anesthesia Type: General Level of consciousness: awake Pain management: pain level controlled Vital Signs Assessment: post-procedure vital signs reviewed and stable Respiratory status: spontaneous breathing Cardiovascular status: stable Postop Assessment: no apparent nausea or vomiting Anesthetic complications: no   No notable events documented.  Last Vitals:  Vitals:   05/18/22 1300 05/18/22 1328  BP: 129/74 (!) 100/51  Pulse: 74 65  Resp: 14 16  Temp: 36.4 C 36.5 C  SpO2: 97% 94%    Last Pain:  Vitals:   05/18/22 1230  TempSrc:   PainSc: Asleep                 Shemika Robbs

## 2022-05-18 NOTE — Anesthesia Procedure Notes (Signed)
Procedure Name: Intubation Date/Time: 05/18/2022 7:48 AM  Performed by: Moshe Salisbury, CRNAPre-anesthesia Checklist: Patient identified, Emergency Drugs available, Suction available and Patient being monitored Patient Re-evaluated:Patient Re-evaluated prior to induction Oxygen Delivery Method: Circle System Utilized Preoxygenation: Pre-oxygenation with 100% oxygen Induction Type: IV induction Ventilation: Mask ventilation without difficulty Laryngoscope Size: Mac and 3 Grade View: Grade I Tube type: Oral Tube size: 7.5 mm Number of attempts: 1 Airway Equipment and Method: Stylet Placement Confirmation: ETT inserted through vocal cords under direct vision, positive ETCO2 and breath sounds checked- equal and bilateral Secured at: 21 cm Tube secured with: Tape Dental Injury: Teeth and Oropharynx as per pre-operative assessment

## 2022-05-18 NOTE — H&P (Signed)
Surgical H&P Update  HPI: 66 y.o. with a history of back and BLE pain. Workup showed mobile spondylolisthesis at L4-5. No changes in health since they were last seen. Still having the above and wishes to proceed with surgery.  PMHx:  Past Medical History:  Diagnosis Date   Arthritis    Asthma    COPD (chronic obstructive pulmonary disease) (HCC)    GERD (gastroesophageal reflux disease)    Hypertension    Pneumonia    FamHx:  Family History  Problem Relation Age of Onset   Angina Father    Parkinson's disease Father    Leukemia Sister    Kidney disease Sister    Kidney disease Brother    Brain cancer Brother        glioblastoma    Cancer Brother    Allergies Son    Asthma Son    Allergies Son    SocHx:  reports that she has been smoking cigarettes. She has a 98.00 pack-year smoking history. She has never been exposed to tobacco smoke. She has never used smokeless tobacco. She reports that she does not drink alcohol and does not use drugs.  Physical Exam: Strength 5/5 x4 and SILTx4 except bilateral stocking numbness  Assesment/Plan: 66 y.o. woman with L4-5 mobile spondylolisthesis, here for L4-5 open decompression / TLIF / PLF. Risks, benefits, and alternatives discussed and the patient would like to continue with surgery.  -OR today -3C post-op  Judith Part, MD 05/18/22 7:35 AM

## 2022-05-18 NOTE — Op Note (Signed)
PATIENT: Frances Nelson  DAY OF SURGERY: 05/18/22   PRE-OPERATIVE DIAGNOSIS:  Lumbar spondylolisthesis, lumbar stenosis   POST-OPERATIVE DIAGNOSIS:  Same   PROCEDURE:  L4-5 laminectomy   SURGEON:  Surgeon(s) and Role:    Judith Part, MD - Primary    Duffy Rhody, MD - Assisting   ANESTHESIA: ETGA   BRIEF HISTORY: This is a 66 year old woman who presented with back and BLE pain, workup showed an L4-5 spondylolisthesis with stenosis. Symptoms were refractory to non-surgical treatment, I therefore recommended surgery in the form of an open L4-5 decompression / TLIF / PLF. This was discussed with the patient as well as risks, benefits, and alternatives and wished to proceed with surgery.   OPERATIVE DETAIL: The patient was taken to the operating room and anesthesia was induced by the anesthesia team. They were placed on the OR table in the prone position with padding of all pressure points. A formal time out was performed with two patient identifiers and confirmed the operative site. The operative site was marked, hair was clipped with surgical clippers, the area was then prepped and draped in a sterile fashion. Fluoroscopic assistance was used to place a percutaneous navigation pin on the right and reference array was attached.   The field was draped and the O-arm was brought into the field. An intra-op CT was performed, sent to the Stealth navigation station, registered to the patient's anatomy, and confirmed with landmarks with acceptable fit. Stereotactic spinal navigation was utilized throughout the procedure for planning and placement of pedicle screw trajectories.  This was used to localize the operative level and a midline incision was placed to expose from L4 to L5. Subperiosteal dissection was performed bilaterally.   Instrumentation was then performed. Bilateral pedicle screws (Medtronic) were placed at L4 and L5. These were placed with navigated instruments by localizing  the pedicle, drilling a pilot hole, cannulating the pedicle with an awl-tap, palpating for pedicle wall breaches, and then placing the screw.   Decompression was performed, which consisted of a laminectomy at L4-5 with a left sided complete facetectomy and right foraminotomy. This was performed with a combination of high speed drill and kerrison rongeurs.   A TLIF was then performed on the left at L4-5 by identifying the exiting root and protecting it, performing a discectomy, prepping the endplates, and inserting an expandable titanium interbody device (Medtronic). This was done with navigated instruments for the above steps.   The pedicle screw tulips were added and the screws were then connected with rods bilaterally and final tightened according to manufacturer torque specifications. The bone was thoroughly decorticated over the facet and transverse processes and the previously resected bone fragments were morselized and used as autograft.   All instrument and sponge counts were correct, the incision was then closed in layers. The patient was then returned to anesthesia for emergence. No apparent complications at the completion of the procedure.   EBL:  166m   DRAINS: none   SPECIMENS: none   TJudith Part MD 05/18/22 7:38 AM

## 2022-05-18 NOTE — Transfer of Care (Signed)
Immediate Anesthesia Transfer of Care Note  Patient: Frances Nelson  Procedure(s) Performed: Lumbar Four-Five Open Laminectomy/Transforaminal Lumbar Interbody Fusion/Posterolateral fusion Application of O-Arm  Patient Location: PACU  Anesthesia Type:General  Level of Consciousness: drowsy and patient cooperative  Airway & Oxygen Therapy: Patient Spontanous Breathing and Patient connected to nasal cannula oxygen  Post-op Assessment: Report given to RN, Post -op Vital signs reviewed and stable, and Patient moving all extremities  Post vital signs: Reviewed and stable  Last Vitals:  Vitals Value Taken Time  BP 129/66 05/18/22 1118  Temp    Pulse 87 05/18/22 1119  Resp 18 05/18/22 1121  SpO2 92 % 05/18/22 1119  Vitals shown include unvalidated device data.  Last Pain:  Vitals:   05/18/22 0608  TempSrc:   PainSc: 7       Patients Stated Pain Goal: 2 (94/32/00 3794)  Complications: No notable events documented.

## 2022-05-18 NOTE — Anesthesia Preprocedure Evaluation (Addendum)
Anesthesia Evaluation  Patient identified by MRN, date of birth, ID band Patient awake    Reviewed: Allergy & Precautions, NPO status , Patient's Chart, lab work & pertinent test results  Airway Mallampati: II       Dental   Pulmonary asthma , pneumonia, COPD, Current Smoker and Patient abstained from smoking.   breath sounds clear to auscultation       Cardiovascular hypertension,  Rhythm:Regular Rate:Normal     Neuro/Psych  Neuromuscular disease    GI/Hepatic Neg liver ROS,GERD  ,,  Endo/Other    Renal/GU Renal disease     Musculoskeletal   Abdominal   Peds  Hematology   Anesthesia Other Findings   Reproductive/Obstetrics                             Anesthesia Physical Anesthesia Plan  ASA: 3  Anesthesia Plan: General   Post-op Pain Management:    Induction: Intravenous  PONV Risk Score and Plan: 2 and Ondansetron, Dexamethasone, Midazolam and Treatment may vary due to age or medical condition  Airway Management Planned: Oral ETT  Additional Equipment:   Intra-op Plan:   Post-operative Plan: Extubation in OR  Informed Consent: I have reviewed the patients History and Physical, chart, labs and discussed the procedure including the risks, benefits and alternatives for the proposed anesthesia with the patient or authorized representative who has indicated his/her understanding and acceptance.     Dental advisory given  Plan Discussed with: Anesthesiologist and CRNA  Anesthesia Plan Comments:        Anesthesia Quick Evaluation

## 2022-05-19 ENCOUNTER — Encounter (HOSPITAL_COMMUNITY): Payer: Self-pay | Admitting: Neurological Surgery

## 2022-05-19 DIAGNOSIS — M4316 Spondylolisthesis, lumbar region: Secondary | ICD-10-CM | POA: Diagnosis not present

## 2022-05-19 MED ORDER — OXYCODONE HCL 10 MG PO TABS: 10.0000 mg | ORAL_TABLET | ORAL | 0 refills | Status: DC | PRN

## 2022-05-19 NOTE — Discharge Instructions (Signed)
   Call Your Doctor If Any of These Occur Redness, drainage, or swelling at the wound.  Temperature greater than 101 degrees. Severe pain not relieved by pain medication. Incision starts to come apart.

## 2022-05-19 NOTE — Evaluation (Signed)
Occupational Therapy Evaluation Patient Details Name: Frances Nelson MRN: [id number] DOB: 1956/05/21 Today's Date: 05/19/2022   History of Present Illness Pt is a 66 y.o. female s/p L4-5 open laminectomy/transforaminal Lumbar interbody fusion with application of O-arm. PMH significant for arthritis, asthma, COPD, GERD, and HTN.   Clinical Impression   PTA, pt lived with her son who assisted with housekeeping tasks. Upon eval, pt presents with decreased balance, activity tolerance, and pain affecting participation in ADL. Pt benefiting from rest breaks between all tasks this session due to pain experience. Pt performing UB ADL with set-up and LB ADL with up to mod A. Pt educated and demonstrating use of compensatory techniques for bed mobility, LB dressing, grooming, and tub/shower transfer within precautions. Recommending HHOT to optimize safety and independence in ADL and IADL.      Recommendations for follow up therapy are one component of a multi-disciplinary discharge planning process, led by the attending physician.  Recommendations may be updated based on patient status, additional functional criteria and insurance authorization.   Follow Up Recommendations  Home health OT    Assistance Recommended at Discharge Intermittent Supervision/Assistance  Patient can return home with the following A little help with walking and/or transfers;A little help with bathing/dressing/bathroom;Assistance with cooking/housework;Assist for transportation;Help with stairs or ramp for entrance    Functional Status Assessment  Patient has had a recent decline in their functional status and demonstrates the ability to make significant improvements in function in a reasonable and predictable amount of time.  Equipment Recommendations  BSC/3in1    Recommendations for Other Services       Precautions / Restrictions Precautions Precautions: Back Precaution Booklet Issued: Yes (comment) Precaution  Comments: All education reviewed within context of ADL Required Braces or Orthoses:  (no brace needed orders) Restrictions Weight Bearing Restrictions: No      Mobility Bed Mobility Overal bed mobility: Needs Assistance Bed Mobility: Sidelying to Sit, Sit to Sidelying   Sidelying to sit: Min guard     Sit to sidelying: Min guard General bed mobility comments: min cues for technique    Transfers Overall transfer level: Needs assistance Equipment used: Rolling walker (2 wheels) Transfers: Sit to/from Stand Sit to Stand: Min guard           General transfer comment: Min guard A for safety. Pt initially benefitting from min cues for safe hand placement for transfer with RW      Balance Overall balance assessment: Needs assistance Sitting-balance support: No upper extremity supported, Feet supported Sitting balance-Leahy Scale: Good     Standing balance support: During functional activity, Bilateral upper extremity supported, No upper extremity supported Standing balance-Leahy Scale: Poor Standing balance comment: Min guard A for all standing tasks                           ADL either performed or assessed with clinical judgement   ADL Overall ADL's : Needs assistance/impaired Eating/Feeding: Modified independent;Sitting   Grooming: Min guard;Standing   Upper Body Bathing: Set up;Sitting   Lower Body Bathing: Min guard;Sit to/from stand   Upper Body Dressing : Set up;Sitting   Lower Body Dressing: Moderate assistance;Sit to/from stand Lower Body Dressing Details (indicate cue type and reason): Mod A to don socks. pt reporting friend will help at home Toilet Transfer: Min guard;Ambulation;Comfort height toilet;Rolling walker (2 wheels)   Toileting- Clothing Manipulation and Hygiene: Set up;Sit to/from stand   Tub/ Shower Transfer: Min guard;Ambulation;BSC/3in1;Rolling walker (2  wheels) Tub/Shower Transfer Details (indicate cue type and reason): Min  guard A for safety. Functional mobility during ADLs: Min guard;Rolling walker (2 wheels) General ADL Comments: limited by pain and muscle spasms resulting in slowed movement throughout     Vision Baseline Vision/History: 1 Wears glasses Ability to See in Adequate Light: 0 Adequate Patient Visual Report: No change from baseline Vision Assessment?: No apparent visual deficits     Perception     Praxis      Pertinent Vitals/Pain Pain Assessment Pain Assessment: Faces Faces Pain Scale: Hurts whole lot Pain Location: operative site, bil hips Pain Descriptors / Indicators: Aching, Spasm, Operative site guarding Pain Intervention(s): Limited activity within patient's tolerance, Monitored during session, Repositioned     Hand Dominance     Extremity/Trunk Assessment Upper Extremity Assessment Upper Extremity Assessment: Overall WFL for tasks assessed   Lower Extremity Assessment Lower Extremity Assessment: Defer to PT evaluation   Cervical / Trunk Assessment Cervical / Trunk Assessment: Back Surgery   Communication Communication Communication: No difficulties   Cognition Arousal/Alertness: Awake/alert Behavior During Therapy: WFL for tasks assessed/performed Overall Cognitive Status: Within Functional Limits for tasks assessed                                 General Comments: Unaware of spinal precautions on arrival, but using log roll for bed mobility and able to recall precautions at end of session.     General Comments  Reporting mild nausea at end of session.    Exercises     Shoulder Instructions      Home Living Family/patient expects to be discharged to:: Private residence Living Arrangements: Children Available Help at Discharge: Family;Friend(s);Available 24 hours/day Type of Home: House Home Access: Ramped entrance     Home Layout: Two level;Able to live on main level with bedroom/bathroom Alternate Level Stairs-Number of Steps: flight    Bathroom Shower/Tub: Teacher, early years/pre: Standard Bathroom Accessibility: Yes How Accessible: Accessible via walker Home Equipment: Cane - single point          Prior Functioning/Environment Prior Level of Function : Independent/Modified Independent               ADLs Comments: Son recently helping with IADL regarding home management (taking oput trash, mopping, vacuuming, sweeping)        OT Problem List: Decreased strength;Impaired balance (sitting and/or standing);Decreased activity tolerance;Decreased safety awareness;Decreased knowledge of precautions;Decreased knowledge of use of DME or AE;Pain      OT Treatment/Interventions: Self-care/ADL training;Therapeutic exercise;DME and/or AE instruction;Therapeutic activities;Patient/family education;Balance training    OT Goals(Current goals can be found in the care plan section) Acute Rehab OT Goals Patient Stated Goal: no pain OT Goal Formulation: With patient Time For Goal Achievement: 06/02/22 Potential to Achieve Goals: Good  OT Frequency: Min 2X/week    Co-evaluation              AM-PAC OT "6 Clicks" Daily Activity     Outcome Measure Help from another person eating meals?: None Help from another person taking care of personal grooming?: A Little Help from another person toileting, which includes using toliet, bedpan, or urinal?: A Little Help from another person bathing (including washing, rinsing, drying)?: A Little Help from another person to put on and taking off regular upper body clothing?: A Little Help from another person to put on and taking off regular lower body clothing?: A Lot 6  Click Score: 18   End of Session Equipment Utilized During Treatment: Gait belt;Rolling walker (2 wheels) Nurse Communication: Mobility status  Activity Tolerance: Patient tolerated treatment well Patient left: in bed;with call bell/phone within reach  OT Visit Diagnosis: Unsteadiness on feet  (R26.81);Other abnormalities of gait and mobility (R26.89);Muscle weakness (generalized) (M62.81);Pain Pain - part of body:  (operative site, bil hips.)                Time: 6314-9702 OT Time Calculation (min): 39 min Charges:  OT General Charges $OT Visit: 1 Visit OT Evaluation $OT Eval Low Complexity: 1 Low OT Treatments $Self Care/Home Management : 23-37 mins  Frances Nelson, OTR/L [facility] Acute Rehabilitation Office: [phone]   Frances Nelson 05/19/2022, 9:43 AM

## 2022-05-19 NOTE — Progress Notes (Signed)
Patient alert and oriented, voiding adequately, skin clean, dry and intact without evidence of skin break down, or symptoms of complications - no redness or edema noted, only slight tenderness at site.  Patient states pain is manageable at time of discharge. Patient has an appointment with MD in 2 weeks 

## 2022-05-19 NOTE — Discharge Summary (Signed)
Discharge Summary  Date of Admission: 05/18/2022  Date of Discharge: 05/19/22  Attending Physician: Emelda Brothers, MD  Hospital Course: Patient was admitted following an uncomplicated M3-5 open decompression and PSIF. They were recovered in PACU and transferred to [facility]. Their preop symptoms were completely resolved, their hospital course was uncomplicated and the patient was discharged home w/ home PT/OT on 05/19/22. They will follow up in clinic with me in clinic in 2 weeks.  Neurologic exam at discharge:  Strength 5/5 x4 and SILTx4 except bilateral stocking numbness  Discharge diagnosis: Lumbar spondylolisthesis  Judith Part, MD 05/19/22 8:34 AM

## 2022-05-19 NOTE — TOC Transition Note (Signed)
Transition of Care [facility]) - CM/SW Discharge Note   Patient Details  Name: Frances Nelson MRN: [id number] Date of Birth: [birth year]  [facility]) CM/SW Contact:  Pollie Friar, RN Phone Number: 05/19/2022, 11:26 AM   Clinical Narrative:    Pt is discharging home with home health services through [place]. Information on the AVS.  DME for home to be delivered to room per bedside RN.  Pt has transportation home.   Final next level of care: Home w Home Health Services Barriers to Discharge: No Barriers Identified   Patient Goals and CMS Choice   CMS Medicare.gov Compare Post Acute Care list provided to:: Patient Choice offered to / list presented to : Patient  Discharge Placement                       Discharge Plan and Services                          HH Arranged: PT, OT [facility] Agency: [place] Date [place]: 05/19/22   Representative spoke with at [place]: [place] ([place]) Interventions     Readmission Risk Interventions     No data to display

## 2022-05-19 NOTE — Progress Notes (Signed)
Neurosurgery Service Progress Note  Subjective: No acute events overnight, back and buttocks sore, no radicular pain  Objective: Vitals:   05/18/22 1649 05/18/22 1953 05/18/22 2342 05/19/22 0457  BP: 104/67 133/70 (!) 149/73 138/78  Pulse: 63 76 88 100  Resp: '16 20 20 20  '$ Temp: 97.8 F (36.6 C) 98.3 F (36.8 C) 100.2 F (37.9 C) 98.9 F (37.2 C)  TempSrc: Oral Oral Oral Oral  SpO2: 95% 94% 93% 91%  Weight:      Height:        Physical Exam: Strength 5/5 x4 and SILTx4 except bilateral stocking numbness, incision c/d/i  Assessment & Plan: 66 y.o. woman s/p L4-5 open lami fusion, recovering well. Post-op films with hardware in good position.  -discharge home today w/ HHPTOT  Judith Part  05/19/22 8:32 AM

## 2022-05-19 NOTE — Evaluation (Signed)
Physical Therapy Evaluation Patient Details Name: Frances Nelson MRN: [id number] DOB: 03-25-1956 Today's Date: 05/19/2022  History of Present Illness  66 y/o female s/p L4-5 open laminectomy/transforaminal Lumbar interbody fusion on 05/18/22. AOZ:HYQMVH, COPD, GERD, and HTN.  Clinical Impression  Patient admitted following above procedure. Patient will have assistance from son at discharge. Patient presents with pain, weakness, and impaired balance. Required assistance for bed mobility but able to stand and ambulate with min guard. Complaining of increased pain and spasms despite premedication. Educated and reinforced back precautions, progressive walking program, and car transfer, patient verbalized understanding. Patient will benefit from skilled PT services during acute stay to address listed deficits. Recommend HHPT at discharge to maximize functional mobility and safety.        Recommendations for follow up therapy are one component of a multi-disciplinary discharge planning process, led by the attending physician.  Recommendations may be updated based on patient status, additional functional criteria and insurance authorization.  Follow Up Recommendations Home health PT      Assistance Recommended at Discharge Intermittent Supervision/Assistance  Patient can return home with the following       Equipment Recommendations Rolling Frances Nelson (2 wheels)  Recommendations for Other Services       Functional Status Assessment Patient has had a recent decline in their functional status and demonstrates the ability to make significant improvements in function in a reasonable and predictable amount of time.     Precautions / Restrictions Precautions Precautions: Back Precaution Booklet Issued: Yes (comment) Precaution Comments: All education reviewed within context of ADL Required Braces or Orthoses:  (no brace needed per orders) Restrictions Weight Bearing Restrictions: No       Mobility  Bed Mobility Overal bed mobility: Needs Assistance Bed Mobility: Sidelying to Sit, Sit to Sidelying   Sidelying to sit: Min assist       General bed mobility comments: min cues for technique. Assist for LE management    Transfers Overall transfer level: Needs assistance Equipment used: Rolling Odetta Forness (2 wheels) Transfers: Sit to/from Stand Sit to Stand: Min guard           General transfer comment: Min guard A for safety. cues for hand placement    Ambulation/Gait Ambulation/Gait assistance: Min guard Gait Distance (Feet): 75 Feet Assistive device: Rolling Ayeisha Lindenberger (2 wheels) Gait Pattern/deviations: Step-through pattern, Decreased stride length Gait velocity: decreased     General Gait Details: very slow antalgic gait pattern. Min guard for safety. No overt LOB.  Stairs            Wheelchair Mobility    Modified Rankin (Stroke Patients Only)       Balance Overall balance assessment: Needs assistance Sitting-balance support: No upper extremity supported, Feet supported Sitting balance-Leahy Scale: Good     Standing balance support: During functional activity, Bilateral upper extremity supported, No upper extremity supported Standing balance-Leahy Scale: Poor Standing balance comment: Min guard A for all standing tasks                             Pertinent Vitals/Pain Pain Assessment Pain Assessment: Faces Faces Pain Scale: Hurts whole lot Pain Location: operative site, bil hips Pain Descriptors / Indicators: Aching, Spasm, Operative site guarding Pain Intervention(s): Monitored during session, Repositioned, Premedicated before session    Home Living Family/patient expects to be discharged to:: Private residence Living Arrangements: Children Available Help at Discharge: Family;Friend(s);Available 24 hours/day Type of Home: House Home Access: Ramped entrance  Alternate Level Stairs-Number of Steps: flight Home  Layout: Two level;Able to live on main level with bedroom/bathroom Home Equipment: Kasandra Knudsen - single point      Prior Function Prior Level of Function : Independent/Modified Independent               ADLs Comments: Son recently helping with IADL regarding home management (taking out trash, mopping, vacuuming, sweeping)     Hand Dominance        Extremity/Trunk Assessment   Upper Extremity Assessment Upper Extremity Assessment: Defer to OT evaluation    Lower Extremity Assessment Lower Extremity Assessment: Generalized weakness    Cervical / Trunk Assessment Cervical / Trunk Assessment: Back Surgery  Communication   Communication: No difficulties  Cognition Arousal/Alertness: Awake/alert Behavior During Therapy: WFL for tasks assessed/performed Overall Cognitive Status: Within Functional Limits for tasks assessed                                          General Comments General comments (skin integrity, edema, etc.): Reporting mild nausea at end of session.    Exercises     Assessment/Plan    PT Assessment Patient needs continued PT services  PT Problem List Decreased strength;Decreased balance;Decreased activity tolerance;Decreased mobility;Decreased knowledge of precautions;Decreased safety awareness       PT Treatment Interventions DME instruction;Gait training;Functional mobility training;Therapeutic activities;Therapeutic exercise;Balance training;Patient/family education    PT Goals (Current goals can be found in the Care Plan section)  Acute Rehab PT Goals Patient Stated Goal: to reduce pain PT Goal Formulation: With patient Time For Goal Achievement: 06/02/22 Potential to Achieve Goals: Good    Frequency Min 5X/week     Co-evaluation               AM-PAC PT "6 Clicks" Mobility  Outcome Measure Help needed turning from your back to your side while in a flat bed without using bedrails?: A Little Help needed moving from  lying on your back to sitting on the side of a flat bed without using bedrails?: A Little Help needed moving to and from a bed to a chair (including a wheelchair)?: A Little Help needed standing up from a chair using your arms (e.g., wheelchair or bedside chair)?: A Little Help needed to walk in hospital room?: A Little Help needed climbing 3-5 steps with a railing? : A Little 6 Click Score: 18    End of Session   Activity Tolerance: Patient limited by pain Patient left: in bed;with call bell/phone within reach Nurse Communication: Mobility status PT Visit Diagnosis: Muscle weakness (generalized) (M62.81);Unsteadiness on feet (R26.81);Pain    Time: 3428-7681 PT Time Calculation (min) (ACUTE ONLY): 36 min   Charges:   PT Evaluation $PT Eval Low Complexity: 1 Low PT Treatments $Therapeutic Activity: 8-22 mins        Cortland Crehan A. Gilford Rile PT, DPT Acute Rehabilitation Services Office [phone]   Linna Hoff 05/19/2022, 12:15 PM

## 2022-05-25 ENCOUNTER — Ambulatory Visit: Payer: Medicare Other | Admitting: Neurology

## 2022-05-26 ENCOUNTER — Emergency Department (HOSPITAL_COMMUNITY)
Admission: EM | Admit: 2022-05-26 | Discharge: 2022-05-26 | Disposition: A | Discharge status: Home / Self Care | Payer: Medicare Other | Attending: Emergency Medicine | Admitting: Emergency Medicine

## 2022-05-26 ENCOUNTER — Encounter (HOSPITAL_COMMUNITY): Payer: Self-pay

## 2022-05-26 ENCOUNTER — Other Ambulatory Visit: Payer: Self-pay

## 2022-05-26 DIAGNOSIS — I10 Essential (primary) hypertension: Secondary | ICD-10-CM | POA: Diagnosis not present

## 2022-05-26 DIAGNOSIS — Z79899 Other long term (current) drug therapy: Secondary | ICD-10-CM | POA: Insufficient documentation

## 2022-05-26 DIAGNOSIS — R58 Hemorrhage, not elsewhere classified: Secondary | ICD-10-CM

## 2022-05-26 DIAGNOSIS — L7622 Postprocedural hemorrhage and hematoma of skin and subcutaneous tissue following other procedure: Secondary | ICD-10-CM | POA: Insufficient documentation

## 2022-05-26 DIAGNOSIS — Z7951 Long term (current) use of inhaled steroids: Secondary | ICD-10-CM | POA: Diagnosis not present

## 2022-05-26 DIAGNOSIS — J449 Chronic obstructive pulmonary disease, unspecified: Secondary | ICD-10-CM | POA: Insufficient documentation

## 2022-05-26 NOTE — ED Provider Notes (Signed)
[facility] EMERGENCY DEPARTMENT Provider Note   CSN: [id number] Arrival date & time: 05/26/22  1005     History  Chief Complaint  Patient presents with   Back Pain   bleeding post op   Post-op Problem    Frances Nelson is a 66 y.o. female.  Patient is a 66 year old female with a history of COPD, hypertension, GERD and recent lumbar fusion with screw fixation on 05/18/2022 who is presenting today due to bleeding from her incision.  Patient reports that the bandage came off on Monday and everything was going well but then today she had blood that soaked through her clothing and ran down her back.  After that she placed a bandage and she has had some ongoing bleeding.  She does not take any anticoagulation and as far she knows had no complication with her surgery.  She has had no fever and nothing that has look like pus draining from her wound.  She is also noticed some redness around her wound and there had been a lot of swelling and bruising.  The history is provided by the patient.  Back Pain      Home Medications Prior to Admission medications   Medication Sig Start Date End Date Taking? Authorizing Provider  acetaminophen (TYLENOL) 650 MG CR tablet Take 1,300 mg by mouth in the morning and at bedtime.    [provider]  acidophilus (RISAQUAD) CAPS capsule Take 1 capsule by mouth in the morning.    [provider]  albuterol (PROVENTIL) (2.5 MG/3ML) 0.083% nebulizer solution USE 1 VIAL IN NEBULIZER EVERY 6HRS FOR SHORTNESS OF BREATH/WHEEZING 01/14/21   Collene Gobble, MD  albuterol (VENTOLIN HFA) 108 (90 Base) MCG/ACT inhaler INHALE 2 PUFFS BY MOUTH EVERY 6 HOURS AS NEEDED FOR WHEEZING/SHORTNESS OF BREATH 10/04/21   Collene Gobble, MD  Calcium-Vitamins C & D (CALCIUM/C/D PO) Take 1 tablet by mouth in the morning.    [provider]  cetirizine (ZYRTEC) 10 MG tablet Take 10 mg by mouth in the morning.    [provider]   Cholecalciferol (HM VITAMIN D3 PO) Take 5,000 Units by mouth in the morning.    [provider]  DULoxetine (CYMBALTA) 60 MG capsule Take 1 capsule (60 mg total) by mouth daily. Patient taking differently: Take 60 mg by mouth at bedtime. 04/06/22   Biagio Borg, MD  gabapentin (NEURONTIN) 100 MG capsule TAKE 1 CAPSULE (100 MG TOTAL) BY MOUTH THREE TIMES DAILY. Patient not taking: Reported on 05/05/2022 04/06/22   Biagio Borg, MD  hydrochlorothiazide (HYDRODIURIL) 12.5 MG tablet Take 1 tablet (12.5 mg total) by mouth daily. Patient taking differently: Take 12.5 mg by mouth daily as needed (swelling/inflammation.). 11/29/21   Biagio Borg, MD  losartan (COZAAR) 25 MG tablet Take 1 tablet (25 mg total) by mouth daily. Patient taking differently: Take 25 mg by mouth in the morning. 01/31/22   Biagio Borg, MD  MELATONIN PO Take 5 mg by mouth at bedtime as needed (sleep).    [provider]  meloxicam (MOBIC) 15 MG tablet Take 0.5 tablets (7.5 mg total) by mouth daily. Patient taking differently: Take 7.5 mg by mouth daily as needed for pain. 10/03/21   Glennon Mac, DO  methocarbamol (ROBAXIN) 750 MG tablet TAKE 1 TABLET (750 MG TOTAL) BY MOUTH EVERY 6 (SIX) HOURS AS NEEDED FOR MUSCLE SPASMS (NOT COVERED) Patient taking differently: Take 375-750 mg by mouth See admin instructions. Take 0.5  tablet (scheduled) by mouth every night & may every 6 hours if needed for spasms. 01/30/22   Biagio Borg, MD  mometasone (NASONEX) 50 MCG/ACT nasal spray Place 2 sprays into the nose every evening.    [provider]  montelukast (SINGULAIR) 10 MG tablet TAKE 1 TABLET BY MOUTH EVERYDAY AT BEDTIME 10/24/21   Byrum, Rose Fillers, MD  Multiple Vitamins-Minerals (MULTIVITAMIN WITH MINERALS) tablet Take 1 tablet by mouth in the morning.    [provider]  omeprazole (PRILOSEC) 40 MG capsule TAKE 1 CAPSULE (40 MG TOTAL) BY MOUTH IN THE MORNING AND AT BEDTIME. 09/30/21   Byrum, Rose Fillers, MD  oxyCODONE 10 MG TABS Take 1 tablet (10 mg total) by mouth every 4 (four) hours as needed for severe pain ((score 7 to 10)). 05/19/22   Judith Part, MD      Allergies    Symbicort [budesonide-formoterol fumarate], Tramadol, Celebrex [celecoxib], Chocolate flavor, Ciprofloxacin, Eggs or egg-derived products, Flavoring agent, Lisinopril, Lyrica [pregabalin], Penicillins, Wellbutrin [bupropion], and Codeine    Review of Systems   Review of Systems  Musculoskeletal:  Positive for back pain.    Physical Exam Updated Vital Signs BP 123/65 (BP Location: Right Arm)   Pulse 70   Temp 98.5 F (36.9 C)   Resp 18   Ht 5' 3.75" (1.619 m)   Wt 87.1 kg   SpO2 100%   BMI 33.22 kg/m  Physical Exam Vitals and nursing note reviewed.  Constitutional:      General: She is not in acute distress.    Appearance: Normal appearance. She is normal weight.  Cardiovascular:     Rate and Rhythm: Normal rate.  Pulmonary:     Effort: Pulmonary effort is normal.  Musculoskeletal:       Back:  Neurological:     Mental Status: She is alert and oriented to person, place, and time. Mental status is at baseline.     Comments: Patient is able to get out of the chair and ambulate to the bathroom     ED Results / Procedures / Treatments   Labs (all labs ordered are listed, but only abnormal results are displayed) Labs Reviewed - No data to display  EKG None  Radiology No results found.  Procedures Procedures    Medications Ordered in ED Medications - No data to display  ED Course/ Medical Decision Making/ A&P                           Medical Decision Making  Patient presenting today after bleeding from her surgical incision.  No evidence of infection of the wound today.  No evidence of wound dehiscence.  Currently not bleeding.  Suspect patient most likely had a hematoma that drained.  Spoke with Dr. Zada Finders her neurosurgeon who did the surgery.  He had no further concerns and  feel that patient can follow-up in the office as planned.  This was discussed with the patient.  She will continue local wound care as needed.        Final Clinical Impression(s) / ED Diagnoses Final diagnoses:  Bleeding    Rx / DC Orders ED Discharge Orders     None         Blanchie Dessert, MD 05/26/22 1223

## 2022-05-26 NOTE — ED Triage Notes (Addendum)
Pt. Had surgery on Nov. 2 back surgery had rods put in. It continues to bleed from the lower end of the incision. Called Dr. No one called back. my back feels like its on fire.

## 2022-05-26 NOTE — Discharge Instructions (Signed)
You can leave the bandage on today which she will have some oozing but do not have to necessarily cover the incision tomorrow if there is no further bleeding.  You most likely had a hematoma under the skin that drained.  Continue your current follow-up as planned

## 2022-06-20 ENCOUNTER — Ambulatory Visit (INDEPENDENT_AMBULATORY_CARE_PROVIDER_SITE_OTHER): Payer: Medicare Other

## 2022-06-20 VITALS — Ht 63.25 in | Wt 189.0 lb

## 2022-06-20 DIAGNOSIS — Z0000 Encounter for general adult medical examination without abnormal findings: Secondary | ICD-10-CM | POA: Diagnosis not present

## 2022-06-20 DIAGNOSIS — Z1231 Encounter for screening mammogram for malignant neoplasm of breast: Secondary | ICD-10-CM

## 2022-06-20 DIAGNOSIS — Z122 Encounter for screening for malignant neoplasm of respiratory organs: Secondary | ICD-10-CM | POA: Diagnosis not present

## 2022-06-20 NOTE — Progress Notes (Signed)
Virtual Visit via Telephone Note  I connected with  Frances Nelson on 06/20/22 at  3:00 PM EST by telephone and verified that I am speaking with the correct person using two identifiers.  Location: Patient: Home Provider: [place] Persons participating in the virtual visit: [place]   I discussed the limitations, risks, security and privacy concerns of performing an evaluation and management service by telephone and the availability of in person appointments. The patient expressed understanding and agreed to proceed.  Interactive audio and video telecommunications were attempted between this nurse and patient, however failed, due to patient having technical difficulties OR patient did not have access to video capability.  We continued and completed visit with audio only.  Some vital signs may be absent or patient reported.   Sheral Flow, LPN  Subjective:   Frances Nelson is a 66 y.o. female who presents for an Initial Medicare Annual Wellness Visit.  Review of Systems     Cardiac Risk Factors include: advanced age (>14mn, >>16women);hypertension;obesity (BMI >30kg/m2);smoking/ tobacco exposure;dyslipidemia     Objective:    Today's Vitals   06/20/22 1503 06/20/22 1505  Weight: 189 lb (85.7 kg)   Height: 5' 3.25" (1.607 m)   PainSc: 5  5    Body mass index is 33.22 kg/m.     06/20/2022    3:07 PM 05/26/2022   12:03 PM 05/26/2022   10:16 AM 05/18/2022    6:05 AM 05/11/2022    2:10 PM 07/02/2018   10:29 AM 06/17/2018    1:56 PM  Advanced Directives  Does Patient Have a Medical Advance Directive? No No No No No No No  Would patient like information on creating a medical advance directive? No - Patient declined No - Patient declined   No - Patient declined No - Patient declined     Current Medications (verified) Outpatient Encounter Medications as of 06/20/2022  Medication Sig   acetaminophen (TYLENOL) 650 MG CR tablet Take 1,300 mg  by mouth in the morning and at bedtime.   acidophilus (RISAQUAD) CAPS capsule Take 1 capsule by mouth in the morning.   albuterol (PROVENTIL) (2.5 MG/3ML) 0.083% nebulizer solution USE 1 VIAL IN NEBULIZER EVERY 6HRS FOR SHORTNESS OF BREATH/WHEEZING   albuterol (VENTOLIN HFA) 108 (90 Base) MCG/ACT inhaler INHALE 2 PUFFS BY MOUTH EVERY 6 HOURS AS NEEDED FOR WHEEZING/SHORTNESS OF BREATH   Calcium-Vitamins C & D (CALCIUM/C/D PO) Take 1 tablet by mouth in the morning.   cetirizine (ZYRTEC) 10 MG tablet Take 10 mg by mouth in the morning.   Cholecalciferol (HM VITAMIN D3 PO) Take 5,000 Units by mouth in the morning.   DULoxetine (CYMBALTA) 60 MG capsule Take 1 capsule (60 mg total) by mouth daily. (Patient taking differently: Take 60 mg by mouth at bedtime.)   gabapentin (NEURONTIN) 100 MG capsule TAKE 1 CAPSULE (100 MG TOTAL) BY MOUTH THREE TIMES DAILY. (Patient not taking: Reported on 05/05/2022)   hydrochlorothiazide (HYDRODIURIL) 12.5 MG tablet Take 1 tablet (12.5 mg total) by mouth daily. (Patient taking differently: Take 12.5 mg by mouth daily as needed (swelling/inflammation.).)   losartan (COZAAR) 25 MG tablet Take 1 tablet (25 mg total) by mouth daily. (Patient taking differently: Take 25 mg by mouth in the morning.)   MELATONIN PO Take 5 mg by mouth at bedtime as needed (sleep).   meloxicam (MOBIC) 15 MG tablet Take 0.5 tablets (7.5 mg total) by mouth daily. (Patient taking differently: Take 7.5 mg by mouth daily as  needed for pain.)   methocarbamol (ROBAXIN) 750 MG tablet TAKE 1 TABLET (750 MG TOTAL) BY MOUTH EVERY 6 (SIX) HOURS AS NEEDED FOR MUSCLE SPASMS (NOT COVERED) (Patient taking differently: Take 375-750 mg by mouth See admin instructions. Take 0.5 tablet (scheduled) by mouth every night & may every 6 hours if needed for spasms.)   mometasone (NASONEX) 50 MCG/ACT nasal spray Place 2 sprays into the nose every evening.   montelukast (SINGULAIR) 10 MG tablet TAKE 1 TABLET BY MOUTH EVERYDAY  AT BEDTIME   Multiple Vitamins-Minerals (MULTIVITAMIN WITH MINERALS) tablet Take 1 tablet by mouth in the morning.   omeprazole (PRILOSEC) 40 MG capsule TAKE 1 CAPSULE (40 MG TOTAL) BY MOUTH IN THE MORNING AND AT BEDTIME.   oxyCODONE 10 MG TABS Take 1 tablet (10 mg total) by mouth every 4 (four) hours as needed for severe pain ((score 7 to 10)). (Patient not taking: Reported on 06/20/2022)   No facility-administered encounter medications on file as of 06/20/2022.    Allergies (verified) Symbicort [budesonide-formoterol fumarate], Tramadol, Celebrex [celecoxib], Chocolate flavor, Ciprofloxacin, Eggs or egg-derived products, Flavoring agent, Lisinopril, Lyrica [pregabalin], Penicillins, Wellbutrin [bupropion], and Codeine   History: Past Medical History:  Diagnosis Date   Arthritis    Asthma    COPD (chronic obstructive pulmonary disease) (HCC)    GERD (gastroesophageal reflux disease)    Hypertension    Pneumonia    Past Surgical History:  Procedure Laterality Date   ABDOMINAL HYSTERECTOMY     APPENDECTOMY     HERNIA REPAIR     Umbilical x 3   TONSILLECTOMY     TRANSFORAMINAL LUMBAR INTERBODY FUSION (TLIF) WITH PEDICLE SCREW FIXATION 1 LEVEL N/A 05/18/2022   Procedure: Lumbar Four-Five Open Laminectomy/Transforaminal Lumbar Interbody Fusion/Posterolateral fusion;  Surgeon: Judith Part, MD;  Location: [facility];  Service: Neurosurgery;  Laterality: N/A;   Family History  Problem Relation Age of Onset   Angina Father    Parkinson's disease Father    Leukemia Sister    Kidney disease Sister    Kidney disease Brother    Brain cancer Brother        glioblastoma    Cancer Brother    Allergies Son    Asthma Son    Allergies Son    Social History   Socioeconomic History   Marital status: Single    Spouse name: Not on file   Number of children: Not on file   Years of education: Not on file   Highest education level: Not on file  Occupational History   Not on file   Tobacco Use   Smoking status: Every Day    Packs/day: 2.00    Years: 49.00    Total pack years: 98.00    Types: Cigarettes    Passive exposure: Never   Smokeless tobacco: Never  Vaping Use   Vaping Use: Never used  Substance and Sexual Activity   Alcohol use: No   Drug use: Never   Sexual activity: Not on file  Other Topics Concern   Not on file  Social History Narrative   Not on file   Social Determinants of Health   Financial Resource Strain: Low Risk  (06/20/2022)   Overall Financial Resource Strain (CARDIA)    Difficulty of Paying Living Expenses: Not hard at all  Food Insecurity: No Food Insecurity (06/20/2022)   Hunger Vital Sign    Worried About Running Out of Food in the Last Year: Never true    Ran Out  of Food in the Last Year: Never true  Transportation Needs: No Transportation Needs (06/20/2022)   PRAPARE - Hydrologist (Medical): No    Lack of Transportation (Non-Medical): No  Physical Activity: Insufficiently Active (06/20/2022)   Exercise Vital Sign    Days of Exercise per Week: 1 day    Minutes of Exercise per Session: 60 min  Stress: No Stress Concern Present (06/20/2022)   [place]    Feeling of Stress : Not at all  Social Connections: Moderately Integrated (06/20/2022)   Social Connection and Isolation Panel [NHANES]    Frequency of Communication with Friends and Family: More than three times a week    Frequency of Social Gatherings with Friends and Family: More than three times a week    Attends Religious Services: 1 to 4 times per year    Active Member of [employer] or Organizations: Yes    Attends Archivist Meetings: 1 to 4 times per year    Marital Status: Never married    Tobacco Counseling Ready to quit: Not Answered Counseling given: Not Answered   Clinical Intake:  Pre-visit preparation completed: Yes  Pain : 0-10 Pain Score: 5  Pain  Type: Other (Comment) (s/p spinal cord surgery) Pain Orientation: Mid, Lower     BMI - recorded: 33.22 Nutritional Status: BMI > 30  Obese Nutritional Risks: None Diabetes: No  How often do you need to have someone help you when you read instructions, pamphlets, or other written materials from your doctor or pharmacy?: 1 - Never What is the last grade level you completed in school?: HSG  Diabetic? no  Interpreter Needed?: No  Information entered by :: Lisette Abu, LPN.   Activities of Daily Living    06/20/2022    3:24 PM 05/11/2022    2:11 PM  In your present state of health, do you have any difficulty performing the following activities:  Hearing? 0   Vision? 0   Difficulty concentrating or making decisions? 0   Walking or climbing stairs? 0   Dressing or bathing? 0   Doing errands, shopping? 0 0  Preparing Food and eating ? N   Using the Toilet? N   In the past six months, have you accidently leaked urine? N   Do you have problems with loss of bowel control? N   Managing your Medications? N   Managing your Finances? N   Housekeeping or managing your Housekeeping? N     Patient Care Team: Biagio Borg, MD as PCP - General (Internal Medicine) Bo Merino, MD as Consulting Physician (Rheumatology) Rosemarie Ax, MD as Consulting Physician (Family Medicine) Brunetta Genera, MD as Consulting Physician (Hematology) [place] Comment, [facility], [place] as Referring Physician (Optometry)  Indicate any recent Medical Services you may have received from other than [facility] providers in the past year (date may be approximate).     Assessment:   This is a routine wellness examination for Clariza.  Hearing/Vision screen Hearing Screening - Comments:: Denies hearing difficulties   Vision Screening - Comments:: Wears rx glasses - up to date with routine eye exams with [facility], [facility].   Dietary issues and exercise activities discussed: Current Exercise Habits: The patient  does not participate in regular exercise at present, Exercise limited by: neurologic condition(s);respiratory conditions(s)   Goals Addressed             This Visit's Progress  My goal is to not have any more surgeries or hospital stays.        Depression Screen    06/20/2022    3:13 PM 04/06/2022    1:56 PM 09/29/2021    1:29 PM 09/29/2021    1:10 PM 04/01/2021    1:22 PM 04/01/2021    1:07 PM 05/06/2020   11:04 AM  PHQ 2/9 Scores  PHQ - 2 Score 0 '5 1 4 '$ 0 0 5  PHQ- 9 Score  '14  12   18    '$ Fall Risk    06/20/2022    3:08 PM 04/06/2022    1:57 PM 04/06/2022    1:18 PM 09/29/2021    1:28 PM 09/29/2021    1:10 PM  Fall Risk   Falls in the past year? '1 1 1 1 1  '$ Number falls in past yr: 1 1 0 0 0  Injury with Fall? 1 1 0 0 1  Risk for fall due to : History of fall(s);Impaired balance/gait;Orthopedic patient      Follow up Education provided;Falls prevention discussed        FALL RISK PREVENTION PERTAINING TO THE HOME:  Any stairs in or around the home? Yes  If so, are there any without handrails? No  Home free of loose throw rugs in walkways, pet beds, electrical cords, etc? Yes  Adequate lighting in your home to reduce risk of falls? Yes   ASSISTIVE DEVICES UTILIZED TO PREVENT FALLS:  Life alert? No  Use of a cane, walker or w/c? Yes  Grab bars in the bathroom? No  Shower chair or bench in shower? Yes  Elevated toilet seat or a handicapped toilet? Yes   TIMED UP AND GO:  Was the test performed? No . Phone Visit   Cognitive Function:        06/20/2022    3:26 PM  6CIT Screen  What Year? 0 points  What month? 0 points  What time? 0 points  Count back from 20 0 points  Months in reverse 0 points  Repeat phrase 0 points  Total Score 0 points    Immunizations Immunization History  Administered Date(s) Administered   PFIZER(Purple Top)SARS-COV-2 Vaccination 11/06/2019, 12/01/2019   Tdap 03/15/2018    TDAP status: Up to date  Flu Vaccine status:  Declined, Education has been provided regarding the importance of this vaccine but patient still declined. Advised may receive this vaccine at local pharmacy or Health Dept. Aware to provide a copy of the vaccination record if obtained from local pharmacy or Health Dept. Verbalized acceptance and understanding.  Pneumococcal vaccine status: Due, Education has been provided regarding the importance of this vaccine. Advised may receive this vaccine at local pharmacy or Health Dept. Aware to provide a copy of the vaccination record if obtained from local pharmacy or Health Dept. Verbalized acceptance and understanding.  Covid-19 vaccine status: Completed vaccines  Qualifies for Shingles Vaccine? Yes   Zostavax completed No   Shingrix Completed?: No.    Education has been provided regarding the importance of this vaccine. Patient has been advised to call insurance company to determine out of pocket expense if they have not yet received this vaccine. Advised may also receive vaccine at local pharmacy or Health Dept. Verbalized acceptance and understanding.  Screening Tests Health Maintenance  Topic Date Due   Lung Cancer Screening  Never done   Zoster Vaccines- Shingrix (1 of 2) Never done   DEXA SCAN  Never done  MAMMOGRAM  01/15/2022   COVID-19 Vaccine (3 - 2023-24 season) 03/17/2022   Pneumonia Vaccine 33+ Years old (1 - PCV) 10/05/2022 (Originally 11/20/1961)   INFLUENZA VACCINE  10/15/2022 (Originally 02/14/2022)   Medicare Annual Wellness (AWV)  06/21/2023   Fecal DNA (Cologuard)  11/14/2024   DTaP/Tdap/Td (2 - Td or Tdap) 03/15/2028   Hepatitis C Screening  Completed   HPV VACCINES  Aged Out    Health Maintenance  Health Maintenance Due  Topic Date Due   Lung Cancer Screening  Never done   Zoster Vaccines- Shingrix (1 of 2) Never done   DEXA SCAN  Never done   MAMMOGRAM  01/15/2022   COVID-19 Vaccine (3 - 2023-24 season) 03/17/2022    Colorectal cancer screening: Type of  screening: Cologuard. Completed 11/24/2021. Repeat every 3 years  Mammogram status: Ordered 06/20/2022. Pt provided with contact info and advised to call to schedule appt.   Bone Density Scan: never done  Lung Cancer Screening: (Low Dose CT Chest recommended if Age 57-80 years, 30 pack-year currently smoking OR have quit w/in 15years.) does qualify.   Lung Cancer Screening Referral: Yes  Additional Screening:  Hepatitis C Screening: does qualify; Completed: 03/15/2018  Vision Screening: Recommended annual ophthalmology exams for early detection of glaucoma and other disorders of the eye. Is the patient up to date with their annual eye exam?  Yes  Who is the provider or what is the name of the office in which the patient attends annual eye exams? [facility], [place]. If pt is not established with a provider, would they like to be referred to a provider to establish care? No .   Dental Screening: Recommended annual dental exams for proper oral hygiene  Community Resource Referral / Chronic Care Management: CRR required this visit?  No   CCM required this visit?  No      Plan:     I have personally reviewed and noted the following in the patient's chart:   Medical and social history Use of alcohol, tobacco or illicit drugs  Current medications and supplements including opioid prescriptions. Patient is not currently taking opioid prescriptions. Functional ability and status Nutritional status Physical activity Advanced directives List of other physicians Hospitalizations, surgeries, and ER visits in previous 12 months Vitals Screenings to include cognitive, depression, and falls Referrals and appointments  In addition, I have reviewed and discussed with patient certain preventive protocols, quality metrics, and best practice recommendations. A written personalized care plan for preventive services as well as general preventive health recommendations were provided to patient.      Sheral Flow, LPN   01/16/7105   Nurse Notes: N/A

## 2022-06-20 NOTE — Patient Instructions (Signed)
Frances Nelson , Thank you for taking time to come for your Medicare Wellness Visit. I appreciate your ongoing commitment to your health goals. Please review the following plan we discussed and let me know if I can assist you in the future.   These are the goals we discussed:  Goals      My goal is to not have any more surgeries or hospital stays.        This is a list of the screening recommended for you and due dates:  Health Maintenance  Topic Date Due   Screening for Lung Cancer  Never done   Zoster (Shingles) Vaccine (1 of 2) Never done   DEXA scan (bone density measurement)  Never done   Mammogram  01/15/2022   COVID-19 Vaccine (3 - 2023-24 season) 03/17/2022   Pneumonia Vaccine (1 - PCV) 10/05/2022*   Flu Shot  10/15/2022*   Medicare Annual Wellness Visit  06/21/2023   Cologuard (Stool DNA test)  11/14/2024   DTaP/Tdap/Td vaccine (2 - Td or Tdap) 03/15/2028   Hepatitis C Screening: USPSTF Recommendation to screen - Ages 49-79 yo.  Completed   HPV Vaccine  Aged Out  *Topic was postponed. The date shown is not the original due date.    Advanced directives: No  Conditions/risks identified: Yes  Next appointment: Follow up in one year for your annual wellness visit.   Preventive Care 20 Years and Older, Female Preventive care refers to lifestyle choices and visits with your health care provider that can promote health and wellness. What does preventive care include? A yearly physical exam. This is also called an annual well check. Dental exams once or twice a year. Routine eye exams. Ask your health care provider how often you should have your eyes checked. Personal lifestyle choices, including: Daily care of your teeth and gums. Regular physical activity. Eating a healthy diet. Avoiding tobacco and drug use. Limiting alcohol use. Practicing safe sex. Taking low-dose aspirin every day. Taking vitamin and mineral supplements as recommended by your health care  provider. What happens during an annual well check? The services and screenings done by your health care provider during your annual well check will depend on your age, overall health, lifestyle risk factors, and family history of disease. Counseling  Your health care provider may ask you questions about your: Alcohol use. Tobacco use. Drug use. Emotional well-being. Home and relationship well-being. Sexual activity. Eating habits. History of falls. Memory and ability to understand (cognition). Work and work Statistician. Reproductive health. Screening  You may have the following tests or measurements: Height, weight, and BMI. Blood pressure. Lipid and cholesterol levels. These may be checked every 5 years, or more frequently if you are over 12 years old. Skin check. Lung cancer screening. You may have this screening every year starting at age 36 if you have a 30-pack-year history of smoking and currently smoke or have quit within the past 15 years. Fecal occult blood test (FOBT) of the stool. You may have this test every year starting at age 70. Flexible sigmoidoscopy or colonoscopy. You may have a sigmoidoscopy every 5 years or a colonoscopy every 10 years starting at age 27. Hepatitis C blood test. Hepatitis B blood test. Sexually transmitted disease (STD) testing. Diabetes screening. This is done by checking your blood sugar (glucose) after you have not eaten for a while (fasting). You may have this done every 1-3 years. Bone density scan. This is done to screen for osteoporosis. You may have  this done starting at age 58. Mammogram. This may be done every 1-2 years. Talk to your health care provider about how often you should have regular mammograms. Talk with your health care provider about your test results, treatment options, and if necessary, the need for more tests. Vaccines  Your health care provider may recommend certain vaccines, such as: Influenza vaccine. This is  recommended every year. Tetanus, diphtheria, and acellular pertussis (Tdap, Td) vaccine. You may need a Td booster every 10 years. Zoster vaccine. You may need this after age 61. Pneumococcal 13-valent conjugate (PCV13) vaccine. One dose is recommended after age 23. Pneumococcal polysaccharide (PPSV23) vaccine. One dose is recommended after age 51. Talk to your health care provider about which screenings and vaccines you need and how often you need them. This information is not intended to replace advice given to you by your health care provider. Make sure you discuss any questions you have with your health care provider. Document Released: 07/30/2015 Document Revised: 03/22/2016 Document Reviewed: 05/04/2015 Elsevier Interactive Patient Education  2017 [place] Prevention in the Home Falls can cause injuries. They can happen to people of all ages. There are many things you can do to make your home safe and to help prevent falls. What can I do on the outside of my home? Regularly fix the edges of walkways and driveways and fix any cracks. Remove anything that might make you trip as you walk through a door, such as a raised step or threshold. Trim any bushes or trees on the path to your home. Use bright outdoor lighting. Clear any walking paths of anything that might make someone trip, such as rocks or tools. Regularly check to see if handrails are loose or broken. Make sure that both sides of any steps have handrails. Any raised decks and porches should have guardrails on the edges. Have any leaves, snow, or ice cleared regularly. Use sand or salt on walking paths during winter. Clean up any spills in your garage right away. This includes oil or grease spills. What can I do in the bathroom? Use night lights. Install grab bars by the toilet and in the tub and shower. Do not use towel bars as grab bars. Use non-skid mats or decals in the tub or shower. If you need to sit down in  the shower, use a plastic, non-slip stool. Keep the floor dry. Clean up any water that spills on the floor as soon as it happens. Remove soap buildup in the tub or shower regularly. Attach bath mats securely with double-sided non-slip rug tape. Do not have throw rugs and other things on the floor that can make you trip. What can I do in the bedroom? Use night lights. Make sure that you have a light by your bed that is easy to reach. Do not use any sheets or blankets that are too big for your bed. They should not hang down onto the floor. Have a firm chair that has side arms. You can use this for support while you get dressed. Do not have throw rugs and other things on the floor that can make you trip. What can I do in the kitchen? Clean up any spills right away. Avoid walking on wet floors. Keep items that you use a lot in easy-to-reach places. If you need to reach something above you, use a strong step stool that has a grab bar. Keep electrical cords out of the way. Do not use floor polish or  wax that makes floors slippery. If you must use wax, use non-skid floor wax. Do not have throw rugs and other things on the floor that can make you trip. What can I do with my stairs? Do not leave any items on the stairs. Make sure that there are handrails on both sides of the stairs and use them. Fix handrails that are broken or loose. Make sure that handrails are as long as the stairways. Check any carpeting to make sure that it is firmly attached to the stairs. Fix any carpet that is loose or worn. Avoid having throw rugs at the top or bottom of the stairs. If you do have throw rugs, attach them to the floor with carpet tape. Make sure that you have a light switch at the top of the stairs and the bottom of the stairs. If you do not have them, ask someone to add them for you. What else can I do to help prevent falls? Wear shoes that: Do not have high heels. Have rubber bottoms. Are comfortable  and fit you well. Are closed at the toe. Do not wear sandals. If you use a stepladder: Make sure that it is fully opened. Do not climb a closed stepladder. Make sure that both sides of the stepladder are locked into place. Ask someone to hold it for you, if possible. Clearly mark and make sure that you can see: Any grab bars or handrails. First and last steps. Where the edge of each step is. Use tools that help you move around (mobility aids) if they are needed. These include: Canes. Walkers. Scooters. Crutches. Turn on the lights when you go into a dark area. Replace any light bulbs as soon as they burn out. Set up your furniture so you have a clear path. Avoid moving your furniture around. If any of your floors are uneven, fix them. If there are any pets around you, be aware of where they are. Review your medicines with your doctor. Some medicines can make you feel dizzy. This can increase your chance of falling. Ask your doctor what other things that you can do to help prevent falls. This information is not intended to replace advice given to you by your health care provider. Make sure you discuss any questions you have with your health care provider. Document Released: 04/29/2009 Document Revised: 12/09/2015 Document Reviewed: 08/07/2014 Elsevier Interactive Patient Education  2017 [employer].

## 2022-06-26 NOTE — Progress Notes (Unsigned)
Benito Mccreedy D.[place] Phone: [phone]   Assessment and Plan:     There are no diagnoses linked to this encounter.  ***   Pertinent previous records reviewed include ***   Follow Up: ***     Subjective:   I, Esten Dollar, am serving as a Education administrator for Doctor Glennon Mac   Chief Complaint: left sided pain    HPI:  10/03/2021 Patient is a 66 year old female complaining of left ankle, left leg, and low back pain. Patient states that her left sided pain started 2015 hasnt been getting any better , Achilles tendonitis bilateral left is more painful, left knee pain , fell down the stairs feb 7th and caught herself her legs were underneath her in a dive position her knee had given out on her. Has picture of her ankle from her fall, low back pain is all the way across pain is mostly in the middle , has numbness and tingling her toes are always numb gets weird sensations , feels like ice cold water is dripping down the back of her leg, gets really sharp pains on her legs and feet, was taking ib for the swelling but has had reactions with it nausea, seems to work in reverse the ib makes the swelling worst, takes tylenol arthritis 650 mg that seems to help a little, when she takes she is able to sleep at night. Pt says she has a lump on her left heel and left knee and a cyst in the back of her knee   10/24/2021 Patient states that today her legs are killing her, had improvement until it got cold again and then the pain came back    10/28/2021 Patient states that she still hs pain in her legs and feet, toes are still numb, beginning of march she fell down the stairs and her left foot 3 great toe down are still bruised look like blood blisters , still getting sharp pains    11/17/2021 Patient states had epidural and some of the swelling is gone and the numbness in legs are gone but the pain is now more intense. Pain  in the shoulders radiating down in both arms to the elbows. These pains have started only since the epidural. Also has had a headache and can get more intense. Sometimes also will get sporadic pain in foot that causes a reflex reaction.   12/09/2021 Patient states that she is walking without her cane the pain is better,  feet and ankle still bother her thinks it more from arthritis,  isn't having the leg pain, was able to lay on the ground for the first time in 6 years, was able to raise single leg got pain when she tried to raise both legs    04/12/2022 Patient states that  she still has left sided pain , also bilateral feet , toes and up to mid calf is numb and hurt 24/7, knee she has a lump that is hard and then she has a baker cyst on the back of her leg    04/25/2022 Patient states she has a pain in the [place] knows it coming from the back today its acting up   05/04/2022 Patient states ready for HA injection , surgeon never got back to her about the injection    06/27/2022 Patient states    Relevant Historical Information: Positive ANA in 2019, but no recent follow-up with rheumatology, chronic  pain in multiple locations, hypertension, COPD  Additional pertinent review of systems negative.   Current Outpatient Medications:    acetaminophen (TYLENOL) 650 MG CR tablet, Take 1,300 mg by mouth in the morning and at bedtime., Disp: , Rfl:    acidophilus (RISAQUAD) CAPS capsule, Take 1 capsule by mouth in the morning., Disp: , Rfl:    albuterol (PROVENTIL) (2.5 MG/3ML) 0.083% nebulizer solution, USE 1 VIAL IN NEBULIZER EVERY 6HRS FOR SHORTNESS OF BREATH/WHEEZING, Disp: 300 mL, Rfl: 5   albuterol (VENTOLIN HFA) 108 (90 Base) MCG/ACT inhaler, INHALE 2 PUFFS BY MOUTH EVERY 6 HOURS AS NEEDED FOR WHEEZING/SHORTNESS OF BREATH, Disp: 18 each, Rfl: 11   Calcium-Vitamins C & D (CALCIUM/C/D PO), Take 1 tablet by mouth in the morning., Disp: , Rfl:    cetirizine (ZYRTEC) 10 MG tablet, Take 10 mg by  mouth in the morning., Disp: , Rfl:    Cholecalciferol (HM VITAMIN D3 PO), Take 5,000 Units by mouth in the morning., Disp: , Rfl:    DULoxetine (CYMBALTA) 60 MG capsule, Take 1 capsule (60 mg total) by mouth daily. (Patient taking differently: Take 60 mg by mouth at bedtime.), Disp: 90 capsule, Rfl: 3   gabapentin (NEURONTIN) 100 MG capsule, TAKE 1 CAPSULE (100 MG TOTAL) BY MOUTH THREE TIMES DAILY. (Patient not taking: Reported on 05/05/2022), Disp: 270 capsule, Rfl: 1   hydrochlorothiazide (HYDRODIURIL) 12.5 MG tablet, Take 1 tablet (12.5 mg total) by mouth daily. (Patient taking differently: Take 12.5 mg by mouth daily as needed (swelling/inflammation.).), Disp: 90 tablet, Rfl: 2   losartan (COZAAR) 25 MG tablet, Take 1 tablet (25 mg total) by mouth daily. (Patient taking differently: Take 25 mg by mouth in the morning.), Disp: 90 tablet, Rfl: 1   MELATONIN PO, Take 5 mg by mouth at bedtime as needed (sleep)., Disp: , Rfl:    meloxicam (MOBIC) 15 MG tablet, Take 0.5 tablets (7.5 mg total) by mouth daily. (Patient taking differently: Take 7.5 mg by mouth daily as needed for pain.), Disp: 60 tablet, Rfl: 0   methocarbamol (ROBAXIN) 750 MG tablet, TAKE 1 TABLET (750 MG TOTAL) BY MOUTH EVERY 6 (SIX) HOURS AS NEEDED FOR MUSCLE SPASMS (NOT COVERED) (Patient taking differently: Take 375-750 mg by mouth See admin instructions. Take 0.5 tablet (scheduled) by mouth every night & may every 6 hours if needed for spasms.), Disp: 120 tablet, Rfl: 2   mometasone (NASONEX) 50 MCG/ACT nasal spray, Place 2 sprays into the nose every evening., Disp: , Rfl:    montelukast (SINGULAIR) 10 MG tablet, TAKE 1 TABLET BY MOUTH EVERYDAY AT BEDTIME, Disp: 90 tablet, Rfl: 3   Multiple Vitamins-Minerals (MULTIVITAMIN WITH MINERALS) tablet, Take 1 tablet by mouth in the morning., Disp: , Rfl:    omeprazole (PRILOSEC) 40 MG capsule, TAKE 1 CAPSULE (40 MG TOTAL) BY MOUTH IN THE MORNING AND AT BEDTIME., Disp: 180 capsule, Rfl: 3    oxyCODONE 10 MG TABS, Take 1 tablet (10 mg total) by mouth every 4 (four) hours as needed for severe pain ((score 7 to 10)). (Patient not taking: Reported on 06/20/2022), Disp: 30 tablet, Rfl: 0   Objective:     There were no vitals filed for this visit.    There is no height or weight on file to calculate BMI.    Physical Exam:    ***   Electronically signed by:  Benito Mccreedy D.[facility] 11:57 AM 06/26/22

## 2022-06-27 ENCOUNTER — Ambulatory Visit (INDEPENDENT_AMBULATORY_CARE_PROVIDER_SITE_OTHER): Payer: Medicare Other | Admitting: Sports Medicine

## 2022-06-27 VITALS — HR 77 | Ht 63.0 in | Wt 189.0 lb

## 2022-06-27 DIAGNOSIS — F40240 Claustrophobia: Secondary | ICD-10-CM

## 2022-06-27 DIAGNOSIS — M25572 Pain in left ankle and joints of left foot: Secondary | ICD-10-CM | POA: Diagnosis not present

## 2022-06-27 DIAGNOSIS — M25562 Pain in left knee: Secondary | ICD-10-CM | POA: Diagnosis not present

## 2022-06-27 DIAGNOSIS — M1712 Unilateral primary osteoarthritis, left knee: Secondary | ICD-10-CM

## 2022-06-27 DIAGNOSIS — G8929 Other chronic pain: Secondary | ICD-10-CM

## 2022-06-27 NOTE — Patient Instructions (Addendum)
Good to see you  [facility] HEP  Left knee MRI referral  Follow up 3 days after to discuss results

## 2022-06-28 ENCOUNTER — Other Ambulatory Visit: Payer: Self-pay | Admitting: Sports Medicine

## 2022-06-28 ENCOUNTER — Other Ambulatory Visit: Payer: Self-pay | Admitting: Internal Medicine

## 2022-06-28 MED ORDER — LORAZEPAM 0.5 MG PO TABS: ORAL_TABLET | ORAL | 0 refills | Status: DC

## 2022-06-28 NOTE — Addendum Note (Signed)
Addended by: Glennon Mac on: 06/28/2022 03:24 PM   Modules accepted: Orders

## 2022-07-03 ENCOUNTER — Other Ambulatory Visit: Payer: Medicare Other

## 2022-07-04 ENCOUNTER — Other Ambulatory Visit: Payer: Medicare Other

## 2022-07-04 ENCOUNTER — Ambulatory Visit (INDEPENDENT_AMBULATORY_CARE_PROVIDER_SITE_OTHER): Payer: Medicare Other

## 2022-07-04 DIAGNOSIS — M1712 Unilateral primary osteoarthritis, left knee: Secondary | ICD-10-CM | POA: Diagnosis not present

## 2022-07-04 DIAGNOSIS — G8929 Other chronic pain: Secondary | ICD-10-CM

## 2022-07-04 DIAGNOSIS — M25562 Pain in left knee: Secondary | ICD-10-CM | POA: Diagnosis not present

## 2022-07-06 NOTE — Progress Notes (Signed)
Frances Nelson D.[place] Phone: [phone]   Assessment and Plan:     1. Chronic pain of left knee 2. Primary osteoarthritis of left knee 3. 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  These are likely because of patient's flares of pain - Patient has had minimal improvement from HA injection on 05/04/2022 and CSI on 04/12/2022, however she is not interested in discussing surgical options - Discussed other injection options including Zilretta and PRP.  Patient elects for Zilretta injection.  We will obtain prior authorization and have patient follow-up in 2 to 3 weeks when she returns from vacation for injection only visit - Continue HEP and physical therapy - May continue Tylenol as needed for day-to-day pain relief  Pertinent previous records reviewed include MRI left knee 07/04/2022   Follow Up: 2 to 3 weeks for procedure only visit with Zilretta left knee injection.  Would then follow-up with patient 4 weeks after.  Patient also experiencing continued left ankle pain, though it is difficult to tell if this is an isolated problem or problem exacerbated by her ongoing knee and back pain   Subjective:   I, Frances Nelson, am serving as a Education administrator for Frances Nelson   Chief Complaint: left sided pain    HPI:  10/03/2021 Patient is a 66 year old female complaining of left ankle, left leg, and low back pain. Patient states that her left sided pain started 2015 hasnt been getting any better , Achilles tendonitis bilateral left is more painful, left knee pain , fell down the stairs feb 7th and caught herself her legs were underneath her in a dive position her knee had given out on her. Has picture of her ankle from her  fall, low back pain is all the way across pain is mostly in the middle , has numbness and tingling her toes are always numb gets weird sensations , feels like ice cold water is dripping down the back of her leg, gets really sharp pains on her legs and feet, was taking ib for the swelling but has had reactions with it nausea, seems to work in reverse the ib makes the swelling worst, takes tylenol arthritis 650 mg that seems to help a little, when she takes she is able to sleep at night. Pt says she has a lump on her left heel and left knee and a cyst in the back of her knee   10/24/2021 Patient states that today her legs are killing her, had improvement until it got cold again and then the pain came back    10/28/2021 Patient states that she still hs pain in her legs and feet, toes are still numb, beginning of march she fell down the stairs and her left foot 3 great toe down are still bruised look like blood blisters , still getting sharp pains    11/17/2021 Patient states had epidural and some of the swelling is gone and the numbness in legs are gone but the pain is now more intense. Pain in the shoulders radiating down in both arms to the elbows. These pains have started only since the epidural. Also has had a headache and can get more intense. Sometimes also will get sporadic pain in foot that causes a reflex reaction.  12/09/2021 Patient states that she is walking without her cane the pain is better,  feet and ankle still bother her thinks it more from arthritis,  isn't having the leg pain, was able to lay on the ground for the first time in 6 years, was able to raise single leg got pain when she tried to raise both legs    04/12/2022 Patient states that  she still has left sided pain , also bilateral feet , toes and up to mid calf is numb and hurt 24/7, knee she has a lump that is hard and then she has a baker cyst on the back of her leg    04/25/2022 Patient states she has a pain in the [place] knows  it coming from the back today its acting up   05/04/2022 Patient states ready for HA injection , surgeon never got back to her about the injection    06/27/2022 Patient states not doing real great had back surgery and was bleeding for 23 days and still oozing, will be going to neurology and PT , is in pain     07/07/2022 Patient states that she is the same      Relevant Historical Information: Positive ANA in 2019, but no recent follow-up with rheumatology, chronic pain in multiple locations, hypertension, COPD   Additional pertinent review of systems negative.   Current Outpatient Medications:    acetaminophen (TYLENOL) 650 MG CR tablet, Take 1,300 mg by mouth in the morning and at bedtime., Disp: , Rfl:    acidophilus (RISAQUAD) CAPS capsule, Take 1 capsule by mouth in the morning., Disp: , Rfl:    albuterol (PROVENTIL) (2.5 MG/3ML) 0.083% nebulizer solution, USE 1 VIAL IN NEBULIZER EVERY 6HRS FOR SHORTNESS OF BREATH/WHEEZING, Disp: 300 mL, Rfl: 5   albuterol (VENTOLIN HFA) 108 (90 Base) MCG/ACT inhaler, INHALE 2 PUFFS BY MOUTH EVERY 6 HOURS AS NEEDED FOR WHEEZING/SHORTNESS OF BREATH, Disp: 18 each, Rfl: 11   Calcium-Vitamins C & D (CALCIUM/C/D PO), Take 1 tablet by mouth in the morning., Disp: , Rfl:    cetirizine (ZYRTEC) 10 MG tablet, Take 10 mg by mouth in the morning., Disp: , Rfl:    Cholecalciferol (HM VITAMIN D3 PO), Take 5,000 Units by mouth in the morning., Disp: , Rfl:    DULoxetine (CYMBALTA) 60 MG capsule, Take 1 capsule (60 mg total) by mouth daily. (Patient taking differently: Take 60 mg by mouth at bedtime.), Disp: 90 capsule, Rfl: 3   gabapentin (NEURONTIN) 100 MG capsule, TAKE 1 CAPSULE (100 MG TOTAL) BY MOUTH THREE TIMES DAILY., Disp: 270 capsule, Rfl: 1   hydrochlorothiazide (HYDRODIURIL) 12.5 MG tablet, TAKE 1 TABLET BY MOUTH EVERY DAY, Disp: 90 tablet, Rfl: 2   LORazepam (ATIVAN) 0.5 MG tablet, 1-2 tabs 30 - 60 min prior to MRI. Do not drive with this medicine.,  Disp: 4 tablet, Rfl: 0   losartan (COZAAR) 25 MG tablet, TAKE 1 TABLET (25 MG TOTAL) BY MOUTH DAILY., Disp: 90 tablet, Rfl: 2   MELATONIN PO, Take 5 mg by mouth at bedtime as needed (sleep)., Disp: , Rfl:    meloxicam (MOBIC) 15 MG tablet, Take 0.5 tablets (7.5 mg total) by mouth daily. (Patient taking differently: Take 7.5 mg by mouth daily as needed for pain.), Disp: 60 tablet, Rfl: 0   methocarbamol (ROBAXIN) 750 MG tablet, TAKE 1 TABLET (750 MG TOTAL) BY MOUTH EVERY 6 (SIX) HOURS AS NEEDED FOR MUSCLE SPASMS (NOT COVERED), Disp: 120 tablet, Rfl: 2   mometasone (  NASONEX) 50 MCG/ACT nasal spray, Place 2 sprays into the nose every evening., Disp: , Rfl:    montelukast (SINGULAIR) 10 MG tablet, TAKE 1 TABLET BY MOUTH EVERYDAY AT BEDTIME, Disp: 90 tablet, Rfl: 3   Multiple Vitamins-Minerals (MULTIVITAMIN WITH MINERALS) tablet, Take 1 tablet by mouth in the morning., Disp: , Rfl:    omeprazole (PRILOSEC) 40 MG capsule, TAKE 1 CAPSULE (40 MG TOTAL) BY MOUTH IN THE MORNING AND AT BEDTIME., Disp: 180 capsule, Rfl: 3   oxyCODONE 10 MG TABS, Take 1 tablet (10 mg total) by mouth every 4 (four) hours as needed for severe pain ((score 7 to 10)). (Patient not taking: Reported on 06/20/2022), Disp: 30 tablet, Rfl: 0   Objective:     Vitals:   07/07/22 1127  Pulse: 90  SpO2: 95%  Weight: 189 lb (85.7 kg)  Height: '5\' 3"'$  (1.6 m)      Body mass index is 33.48 kg/m.    Physical Exam:    General:  awake, alert oriented, no acute distress nontoxic Skin: no suspicious lesions or rashes Neuro:sensation intact, no deficits, strength 5/5 with no deficits, no atrophy, normal muscle tone Psych: No signs of anxiety, depression or other mood disorder   Left knee: Mild swelling consistent with joint effusion, as well as mild swelling superficial to quadriceps tendon in suprapatellar bursa Mild Baker's cyst present No deformity + fluid wave, joint milking ROM Flex 95, Ext 5 TTP quadriceps tendon, medial  femoral condyle, medial joint line, lateral joint line NTTP over the  lat fem condyle, patella, plica, patella tendon, tibial tuberostiy, fibular head, posterior fossa, pes anserine bursa, gerdy's tubercle  Neg anterior and posterior drawer Neg lachman Neg sag sign    Electronically signed by:  Frances Nelson D.[facility] 11:50 AM 07/07/22

## 2022-07-07 ENCOUNTER — Ambulatory Visit (INDEPENDENT_AMBULATORY_CARE_PROVIDER_SITE_OTHER): Payer: Medicare Other | Admitting: Sports Medicine

## 2022-07-07 VITALS — HR 90 | Ht 63.0 in | Wt 189.0 lb

## 2022-07-07 DIAGNOSIS — M1712 Unilateral primary osteoarthritis, left knee: Secondary | ICD-10-CM | POA: Diagnosis not present

## 2022-07-07 DIAGNOSIS — G8929 Other chronic pain: Secondary | ICD-10-CM | POA: Diagnosis not present

## 2022-07-07 DIAGNOSIS — M23322 Other meniscus derangements, posterior horn of medial meniscus, left knee: Secondary | ICD-10-CM | POA: Diagnosis not present

## 2022-07-07 DIAGNOSIS — M25562 Pain in left knee: Secondary | ICD-10-CM

## 2022-07-07 NOTE — Patient Instructions (Addendum)
Good to see you  See you in 2 weeks for that zilretta injection

## 2022-07-19 ENCOUNTER — Telehealth: Payer: Self-pay | Admitting: *Deleted

## 2022-07-19 ENCOUNTER — Ambulatory Visit: Payer: Medicare Other | Admitting: Neurology

## 2022-07-19 NOTE — Progress Notes (Signed)
Frances Nelson [name] [address] [place] [address] Phone: [phone]   Assessment and Plan:     1. Chronic pain of left knee 2. Primary osteoarthritis of left knee 3.  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  Tolerated well per note below  Procedure: Knee Joint Injection Side: Left Indication: Flare of osteoarthritis and degenerative meniscal tear  Risks explained and consent was given verbally. The site was cleaned with alcohol prep. A needle was introduced with an anterio-lateral approach. Injection given using Zilretta 32 mg. This was well tolerated and resulted in symptomatic relief.  Needle was removed, hemostasis achieved, and post injection instructions were explained.   Pt was advised to call or return to clinic if these symptoms worsen or fail to improve as anticipated.   Pertinent previous records reviewed include none   Follow Up: 4 weeks for reevaluation.  If no improvement or worsening of symptoms, could discuss PRP versus referral to orthopedic surgery   Subjective:   I, Frances Nelson, am serving as a Neurosurgeon for Doctor Richardean Sale   Chief Complaint: left sided pain    HPI:  10/03/2021 Patient is a 67 year old female complaining of left ankle, left leg, and low back pain. Patient states that her left sided pain started 2015 hasnt been getting any better , Achilles tendonitis bilateral left is more painful, left knee pain , fell down the stairs feb 7th and caught herself her legs were underneath her in a dive position her knee had given out on her. Has picture of her ankle from her fall, low back pain is all the way across pain is mostly in the middle , has numbness and tingling her toes are always numb gets weird sensations , feels like ice cold water is dripping down the back of  her leg, gets really sharp pains on her legs and feet, was taking ib for the swelling but has had reactions with it nausea, seems to work in reverse the ib makes the swelling worst, takes tylenol arthritis 650 mg that seems to help a little, when she takes she is able to sleep at night. Pt says she has a lump on her left heel and left knee and a cyst in the back of her knee   10/24/2021 Patient states that today her legs are killing her, had improvement until it got cold again and then the pain came back    10/28/2021 Patient states that she still hs pain in her legs and feet, toes are still numb, beginning of march she fell down the stairs and her left foot 3 great toe down are still bruised look like blood blisters , still getting sharp pains    11/17/2021 Patient states had epidural and some of the swelling is gone and the numbness in legs are gone but the pain is now more intense. Pain in the shoulders radiating down in both arms to the elbows. These pains have started only since the epidural. Also has had a headache and can get more intense. Sometimes also will get sporadic pain in foot that causes a reflex reaction.   12/09/2021 Patient states that she is walking without her cane the pain is better,  feet and ankle still bother her thinks it more from arthritis,  isn't having the leg pain, was able to lay on the ground for  the first time in 6 years, was able to raise single leg got pain when she tried to raise both legs    04/12/2022 Patient states that  she still has left sided pain , also bilateral feet , toes and up to mid calf is numb and hurt 24/7, knee she has a lump that is hard and then she has a baker cyst on the back of her leg    04/25/2022 Patient states she has a pain in the glute knows it coming from the back today its acting up   05/04/2022 Patient states ready for HA injection , surgeon never got back to her about the injection    06/27/2022 Patient states not doing real great  had back surgery and was bleeding for 23 days and still oozing, will be going to neurology and PT , is in pain     07/07/2022 Patient states that she is the same    07/20/2022 Patient presents today for procedure only visit zilretta injection to left knee.     Relevant Historical Information: Positive ANA in 2019, but no recent follow-up with rheumatology, chronic pain in multiple locations, hypertension, COPD  Additional pertinent review of systems negative.   Current Outpatient Medications:    acetaminophen (TYLENOL) 650 MG CR tablet, Take 1,300 mg by mouth in the morning and at bedtime., Disp: , Rfl:    acidophilus (RISAQUAD) CAPS capsule, Take 1 capsule by mouth in the morning., Disp: , Rfl:    albuterol (PROVENTIL) (2.5 MG/3ML) 0.083% nebulizer solution, USE 1 VIAL IN NEBULIZER EVERY 6HRS FOR SHORTNESS OF BREATH/WHEEZING, Disp: 300 mL, Rfl: 5   albuterol (VENTOLIN HFA) 108 (90 Base) MCG/ACT inhaler, INHALE 2 PUFFS BY MOUTH EVERY 6 HOURS AS NEEDED FOR WHEEZING/SHORTNESS OF BREATH, Disp: 18 each, Rfl: 11   Calcium-Vitamins C & D (CALCIUM/C/D PO), Take 1 tablet by mouth in the morning., Disp: , Rfl:    cetirizine (ZYRTEC) 10 MG tablet, Take 10 mg by mouth in the morning., Disp: , Rfl:    Cholecalciferol (HM VITAMIN D3 PO), Take 5,000 Units by mouth in the morning., Disp: , Rfl:    DULoxetine (CYMBALTA) 60 MG capsule, Take 1 capsule (60 mg total) by mouth daily. (Patient taking differently: Take 60 mg by mouth at bedtime.), Disp: 90 capsule, Rfl: 3   gabapentin (NEURONTIN) 100 MG capsule, TAKE 1 CAPSULE (100 MG TOTAL) BY MOUTH THREE TIMES DAILY., Disp: 270 capsule, Rfl: 1   hydrochlorothiazide (HYDRODIURIL) 12.5 MG tablet, TAKE 1 TABLET BY MOUTH EVERY DAY, Disp: 90 tablet, Rfl: 2   LORazepam (ATIVAN) 0.5 MG tablet, 1-2 tabs 30 - 60 min prior to MRI. Do not drive with this medicine., Disp: 4 tablet, Rfl: 0   losartan (COZAAR) 25 MG tablet, TAKE 1 TABLET (25 MG TOTAL) BY MOUTH DAILY., Disp: 90  tablet, Rfl: 2   MELATONIN PO, Take 5 mg by mouth at bedtime as needed (sleep)., Disp: , Rfl:    meloxicam (MOBIC) 15 MG tablet, Take 0.5 tablets (7.5 mg total) by mouth daily. (Patient taking differently: Take 7.5 mg by mouth daily as needed for pain.), Disp: 60 tablet, Rfl: 0   methocarbamol (ROBAXIN) 750 MG tablet, TAKE 1 TABLET (750 MG TOTAL) BY MOUTH EVERY 6 (SIX) HOURS AS NEEDED FOR MUSCLE SPASMS (NOT COVERED), Disp: 120 tablet, Rfl: 2   mometasone (NASONEX) 50 MCG/ACT nasal spray, Place 2 sprays into the nose every evening., Disp: , Rfl:    montelukast (SINGULAIR) 10 MG tablet, TAKE 1  TABLET BY MOUTH EVERYDAY AT BEDTIME, Disp: 90 tablet, Rfl: 3   Multiple Vitamins-Minerals (MULTIVITAMIN WITH MINERALS) tablet, Take 1 tablet by mouth in the morning., Disp: , Rfl:    omeprazole (PRILOSEC) 40 MG capsule, TAKE 1 CAPSULE (40 MG TOTAL) BY MOUTH IN THE MORNING AND AT BEDTIME., Disp: 180 capsule, Rfl: 3   oxyCODONE 10 MG TABS, Take 1 tablet (10 mg total) by mouth every 4 (four) hours as needed for severe pain ((score 7 to 10)). (Patient not taking: Reported on 06/20/2022), Disp: 30 tablet, Rfl: 0      Electronically signed by:  Frances Nelson [name] 11:03 AM 07/20/22

## 2022-07-19 NOTE — Telephone Encounter (Signed)
L knee Zilretta initiated through portal.

## 2022-07-19 NOTE — Telephone Encounter (Signed)
-----   Message from [facility], LAT sent at 07/07/2022 11:46 AM EST ----- Zilretta left knee please

## 2022-07-20 ENCOUNTER — Ambulatory Visit (INDEPENDENT_AMBULATORY_CARE_PROVIDER_SITE_OTHER): Payer: Medicare Other | Admitting: Sports Medicine

## 2022-07-20 DIAGNOSIS — M23322 Other meniscus derangements, posterior horn of medial meniscus, left knee: Secondary | ICD-10-CM | POA: Diagnosis not present

## 2022-07-20 DIAGNOSIS — M1712 Unilateral primary osteoarthritis, left knee: Secondary | ICD-10-CM

## 2022-07-20 DIAGNOSIS — G8929 Other chronic pain: Secondary | ICD-10-CM

## 2022-07-20 DIAGNOSIS — M25562 Pain in left knee: Secondary | ICD-10-CM | POA: Diagnosis not present

## 2022-07-20 MED ORDER — TRIAMCINOLONE ACETONIDE 32 MG IX SRER
32.0000 mg | Freq: Once | INTRA_ARTICULAR | Status: AC
  Administered 2022-07-20: 32 mg via INTRA_ARTICULAR

## 2022-07-20 NOTE — Telephone Encounter (Signed)
L knee Zilretta approved. No pre-cert required.

## 2022-07-20 NOTE — Addendum Note (Signed)
Addended by: Pincus Badder R on: 07/20/2022 11:06 AM   Modules accepted: Orders

## 2022-08-10 NOTE — Progress Notes (Signed)
Initial neurology clinic note  SERVICE DATE: 08/17/22  Reason for Evaluation: Consultation requested by Biagio Borg, MD for an opinion regarding numbness of lower extremities. My final recommendations will be communicated back to the requesting physician by way of shared medical record or letter to requesting physician via [place] mail.  HPI: This is [name], a 67 y.o. right-handed female with a medical history of chronic pain, Baker's cyst of left knee, HTN, HLD, vit D deficiency, COPD (smoker), OA, degenerative disease and spinal stenosis of cervical and lumbar spine, depression who presents to neurology clinic with the chief complaint of numbness and tingling of legs. The patient is alone today. Her primary concern is help with symptoms and pain, with numbness and tingling in legs being most annoying.  Patient's symptoms started with left achilles tendonitis around 2018. She started getting numbness and tingling in both feet around 2019, like they were falling asleep. It has been constant. The symptoms have progressed over the years so that the numbness and tingling are up to the mid calf in the left leg and to lower leg on right. Symptoms have improved some on the right, but always been worse on the left. She endorses chronic back pain that predates symptoms in feet as well. She endorses occasional sciatica. She sometimes feels like the radiation is coming from the big toe up her leg, like someone is sticking her with a needle in her feet.  Sports medicine saw patient and felt symptoms were related to lumbar stenosis, so she was referred to NSGY who did L4-5 open decompression and PSIF on 05/18/22. Patient continues to have pain in back and hips. She is not really having shooting pain going down. The numbness and tingling in her legs has not significantly changed. It wakes her up at night. She endorses leg cramps. She denies bowel or bladder changes and denies saddle anesthesia.  Patient  is currently taking Cymbalta 60 mg qhs, which she is unsure if it helps. She has been on it for over a year. She has previously tried gabapentin, but stopped this as she did not think it was helping. She has tried Lyrica but had a bad reaction (cannot remember what the reaction was). She has tried nortriptyline in the past as well. She has tried diclofenac cream, lidocaine cream, biofreeze, and bengay type of medications. She has had a little success with CBD cream. She also takes Tylenol arthritis.  Patient was referred to PT. She has not yet scheduled this.  Patient also has a lot of issues with her left knee including a Baker's cyst and torn meniscus. She sees sports medicine for this. She is using a cane to walk due to this pain.  Patient has previously seen neurology in the past at [facility]. Patient had a 4 limb EMG done. She was told she had carpal tunnel syndrome, which was odd to her as she did not have symptoms in her hands. She was not sure why they even checked her arms. She does mention that since her back surgery in 05/2022, she has noticed some cramping/locking up of fingers.  Patient saw Dr. Estanislado Pandy in rheumatology on 03/22/22. Per clinic note: Positive ANA (antinuclear antibody) - 10/03/21: ANA 1:40NH, RF<14, ESR 20, Uric acid 7.5, anti-CCP<16, CRP 1.2 -there is no history of oral ulcers, nasal ulcers, malar rash, photosensitivity, Raynaud's phenomenon or lymphadenopathy.  She has dry mouth and dry eyes symptoms most likely related to smoking.  Over-the-counter products were discussed.  I will obtain following labs today.  Plan: Protein / creatinine ratio, urine, ANA, Anti-DNA antibody, double-stranded, C3 and C4, Sjogrens syndrome-A extractable nuclear antibody, Sjogrens syndrome-B extractable nuclear antibody, Anti-Smith antibody, RNP Antibody   She report any constitutional symptoms like fever, night sweats, anorexia or unintentional weight loss.  EtOH use: None  Restrictive diet?  No Family history of neuropathy/myopathy/neurologic disease? Not definitively, but 2 sisters has problems with left leg   MEDICATIONS:  Outpatient Encounter Medications as of 08/17/2022  Medication Sig   acetaminophen (TYLENOL) 650 MG CR tablet Take 1,300 mg by mouth in the morning and at bedtime.   acidophilus (RISAQUAD) CAPS capsule Take 1 capsule by mouth in the morning.   albuterol (PROVENTIL) (2.5 MG/3ML) 0.083% nebulizer solution USE 1 VIAL IN NEBULIZER EVERY 6HRS FOR SHORTNESS OF BREATH/WHEEZING   albuterol (VENTOLIN HFA) 108 (90 Base) MCG/ACT inhaler INHALE 2 PUFFS BY MOUTH EVERY 6 HOURS AS NEEDED FOR WHEEZING/SHORTNESS OF BREATH   Calcium-Vitamins C & D (CALCIUM/C/D PO) Take 1 tablet by mouth in the morning.   cetirizine (ZYRTEC) 10 MG tablet Take 10 mg by mouth in the morning.   Cholecalciferol (HM VITAMIN D3 PO) Take 5,000 Units by mouth in the morning.   COLLAGEN PO Take by mouth. Collagen Peptides   DULoxetine (CYMBALTA) 60 MG capsule Take 1 capsule (60 mg total) by mouth daily. (Patient taking differently: Take 60 mg by mouth at bedtime.)   gabapentin (NEURONTIN) 100 MG capsule TAKE 1 CAPSULE (100 MG TOTAL) BY MOUTH THREE TIMES DAILY.   hydrochlorothiazide (HYDRODIURIL) 12.5 MG tablet TAKE 1 TABLET BY MOUTH EVERY DAY   LORazepam (ATIVAN) 0.5 MG tablet 1-2 tabs 30 - 60 min prior to MRI. Do not drive with this medicine.   losartan (COZAAR) 25 MG tablet TAKE 1 TABLET (25 MG TOTAL) BY MOUTH DAILY.   MELATONIN PO Take 5 mg by mouth at bedtime as needed (sleep).   meloxicam (MOBIC) 15 MG tablet Take 0.5 tablets (7.5 mg total) by mouth daily. (Patient taking differently: Take 7.5 mg by mouth daily as needed for pain.)   methocarbamol (ROBAXIN) 750 MG tablet TAKE 1 TABLET (750 MG TOTAL) BY MOUTH EVERY 6 (SIX) HOURS AS NEEDED FOR MUSCLE SPASMS (NOT COVERED)   mometasone (NASONEX) 50 MCG/ACT nasal spray Place 2 sprays into the nose every evening.   montelukast (SINGULAIR) 10 MG tablet TAKE  1 TABLET BY MOUTH EVERYDAY AT BEDTIME   Multiple Vitamins-Minerals (MULTIVITAMIN WITH MINERALS) tablet Take 1 tablet by mouth in the morning.   omeprazole (PRILOSEC) 40 MG capsule TAKE 1 CAPSULE (40 MG TOTAL) BY MOUTH IN THE MORNING AND AT BEDTIME.   [DISCONTINUED] oxyCODONE 10 MG TABS Take 1 tablet (10 mg total) by mouth every 4 (four) hours as needed for severe pain ((score 7 to 10)). (Patient not taking: Reported on 06/20/2022)   No facility-administered encounter medications on file as of 08/17/2022.    PAST MEDICAL HISTORY: Past Medical History:  Diagnosis Date   Arthritis    Asthma    COPD (chronic obstructive pulmonary disease) (HCC)    GERD (gastroesophageal reflux disease)    Hypertension    Pneumonia     PAST SURGICAL HISTORY: Past Surgical History:  Procedure Laterality Date   ABDOMINAL HYSTERECTOMY     APPENDECTOMY     HERNIA REPAIR     Umbilical x 3   TONSILLECTOMY     TRANSFORAMINAL LUMBAR INTERBODY FUSION (TLIF) WITH PEDICLE SCREW FIXATION 1 LEVEL N/A 05/18/2022   Procedure:  Lumbar Four-Five Open Laminectomy/Transforaminal Lumbar Interbody Fusion/Posterolateral fusion;  Surgeon: Judith Part, MD;  Location: [facility];  Service: Neurosurgery;  Laterality: N/A;    ALLERGIES: Allergies  Allergen Reactions   Symbicort [Budesonide-Formoterol Fumarate] Other (See Comments)    Patient reported dizziness, nausea, headaches and sore throat while using Symbicort 160. Reported on 09/03/17   Tramadol Hives   Celebrex [Celecoxib]     Bleeding.     Chocolate Flavor    Ciprofloxacin Nausea And Vomiting    Headache, shaking.    Eggs Or Egg-Derived Products    Flavoring Agent     Unknown   Lisinopril Cough    Pt off med for a week, some improvement   Lyrica [Pregabalin]     "Made me out of it."    Penicillins     Patient preference   Wellbutrin [Bupropion]    Codeine Palpitations    FAMILY HISTORY: Family History  Problem Relation Age of Onset   Angina Father     Parkinson's disease Father    Leukemia Sister    Kidney disease Sister    Kidney disease Brother    Brain cancer Brother        glioblastoma    Cancer Brother    Allergies Son    Asthma Son    Allergies Son     SOCIAL HISTORY: Social History   Tobacco Use   Smoking status: Every Day    Packs/day: 2.00    Years: 49.00    Total pack years: 98.00    Types: Cigarettes    Passive exposure: Never   Smokeless tobacco: Never  Vaping Use   Vaping Use: Never used  Substance Use Topics   Alcohol use: No   Drug use: Never   Social History   Social History Narrative   Right Handed    Lives in a two story home - Lives with son      OBJECTIVE: PHYSICAL EXAM: BP 123/71   Pulse 88   Ht '5\' 4"'$  (1.626 m)   Wt 197 lb (89.4 kg)   SpO2 96%   BMI 33.81 kg/m   General: General appearance: Awake and alert. No distress. Cooperative with exam.  Skin: No obvious rash or jaundice. HEENT: Atraumatic. Anicteric. Lungs: Non-labored breathing on room air  Extremities: Swelling of left knee and ankle.  Psych: Affect appropriate.  Neurological: Mental Status: Alert. Speech fluent. No pseudobulbar affect Cranial Nerves: CNII: No RAPD. Visual fields grossly intact. CNIII, IV, VI: PERRL. No nystagmus. EOMI. CN V: Facial sensation intact bilaterally to fine touch. CN VII: Facial muscles symmetric and strong. No ptosis at rest. CN VIII: Hearing grossly intact bilaterally. CN IX: No hypophonia. CN X: Palate elevates symmetrically. CN XI: Full strength shoulder shrug bilaterally. CN XII: Tongue protrusion full and midline. No atrophy or fasciculations. No significant dysarthria Motor: Tone is normal. No atrophy.  Individual muscle group testing (MRC grade out of 5):  Movement   Give way weakness throughout - pain related?  Neck flexion 5    Neck extension 5     Right Left   Shoulder abduction 5 5   Elbow flexion 5 5   Elbow extension 5 5   Finger abduction - FDI 5 5   Finger  abduction - ADM 5 5   Finger extension 5 5   Finger distal flexion - 2/'3 5 5   '$ Finger distal flexion - 4/'5 5 5   '$ Thumb flexion - FPL 5 5  Thumb abduction - APB 5 5    Hip flexion 5 5   Hip extension 5 5   Hip adduction 5 5   Hip abduction 5 5   Knee extension 5 5   Knee flexion 5 5   Dorsiflexion 5 5   Plantarflexion 5 5     Reflexes:  Right Left   Bicep 2+ 2+   Tricep 2+ 2+   BrRad 2+ 2+   Knee 2+ 2+   Ankle 2+ 2+    Pathological Reflexes: Babinski: flexor response bilaterally Hoffman: absent bilaterally Troemner: absent bilaterally Sensation: Pinprick: 1% of normal in bilateral feet with distal to proximal gradient until upper calf on right and thigh on left. Intact in bilateral upper extremities. Vibration: Intact in upper extremities. Absent in bilateral great toes, diminished at ankles, fully present at bilateral patella Proprioception: absent at bilateral great toes Coordination: Intact finger-to- nose-finger bilaterally. Romberg moderate sway. Gait: Antalgic gait. Pain in hips and left knee.   Lab and Test Review: Internal labs: BMP and CBC (05/11/22): unremarkable HbA1c (04/06/22): 5.5 (6.2 is highest in the past) ANA (03/22/22): positive at 1:40 RNP ab (03/22/22): 1.7 (positive) Normal or unremarkable (03/22/22): anti-Sm, SSA, SSB, C3, C4, dsDNA, CCP (10/03/21)  Imaging: Left knee MRI (07/04/22): IMPRESSION: 1. Complex degenerative tear and diminution of the posterior horn of the medial meniscus. 2. Mild osteoarthritis of the medial tibiofemoral compartment.  Lumbar xray (05/18/22): FINDINGS: Again noted are a slight levorotary lumbar scoliosis apex L2, minimal grade 1 L4-5 spondylolisthesis unchanged as well.   Bilateral posterior fusion rods and pedicle screws, laminectomy defect and interbody metallic disc space hardware are again noted at L4-5.   No acute hardware complication or hardware failure are seen.   Osteopenia. There is preservation of the  normal lumbar vertebral heights without evidence of fractures.   The lumbar discs are maintained in heights. There is spondylosis. Mild facet hypertrophy at L2-3 inferiorly.   The SI joints are unremarkable, as visualized.   The aorta and common iliac arteries are heavily calcified.   IMPRESSION: 1. No evidence of fractures. 2. Osteopenia and degenerative change. 3. Slight levorotary lumbar scoliosis apex L2, minimal grade 1 L4-5 spondylolisthesis unchanged. 4. L4-5 dorsal fusion construct with interbody hardware and laminectomy. 5. Aortic atherosclerosis.  MRI lumbar spine (10/25/21): FINDINGS: Segmentation:  5 lumbar vertebra.   Alignment: Mild retrolisthesis L2-3 and L3-4. 4 mm anterolisthesis L4-5   Vertebrae:  Normal bone marrow.  Negative for fracture or mass   Conus medullaris and cauda equina: Conus extends to the T12-L1 level. Conus and cauda equina appear normal.   Paraspinal and other soft tissues: Negative for paraspinous mass or adenopathy   Disc levels:   L1-2: Negative   L2-3: Mild disc degeneration and disc bulging. No significant spinal or foraminal stenosis   L3-4: Mild disc and mild facet degeneration. Negative for disc protrusion or stenosis.   L4-5: 4 mm anterolisthesis. Diffuse disc bulging and moderate to advanced facet degeneration. Moderate spinal stenosis. Moderate subarticular stenosis bilaterally. Right foraminal encroachment with right L4 and L5 nerve root impingement.   L5-S1: Negative   IMPRESSION: 4 mm anterolisthesis L4-5. Moderate spinal stenosis. Moderate subarticular and foraminal stenosis on the right. Moderate left subarticular stenosis.  Cervical spine xray (04/09/20): FINDINGS: There is no evidence of cervical spine fracture or prevertebral soft tissue swelling. Alignment is normal. No other significant bone abnormalities are identified.   IMPRESSION: Negative cervical spine radiographs.  EMG (06/03/20 by Dr.  Letta Pate  in PM&R): Normal study of LLE - no neuropathy or radiculopathy  ASSESSMENT: Haydn Cush is a 67 y.o. female who presents for evaluation of numbness and tingling in bilateral lower extremities with associated back pain. She has a relevant medical history of chronic pain, Baker's cyst of left knee, HTN, HLD, vit D deficiency, COPD (smoker), OA, degenerative disease and spinal stenosis of cervical and lumbar spine, depression. Her neurological examination is pertinent for diminished sensation to pinprick, vibration, and proprioception in bilateral lower extremities in a length dependent fashion. The etiology of patient's symptoms is currently unclear but likely multifactorial with possible contributions from lumbosacral radiculopathy, OA/MSK problems, peripheral neuropathy, and/or a chronic pain syndrome. Her only known risk factor for PN is pre-diabetes. I will send labs for other treatable causes and get a repeat EMG to further characterize her symptoms as EMG in 2021 of LLE was normal per report.  PLAN: -Blood work: B1, B12, IFE -EMG: PN (L > R) -Increase Cymbalta to 30 mg in the morning and 60 mg at night. Can increase to 60 mg BID if needed.  -Return to clinic in 2 months  The impression above as well as the plan as outlined below were extensively discussed with the patient who voiced understanding. All questions were answered to their satisfaction.  The patient was counseled on pertinent fall precautions per the printed material provided today, and as noted under the "Patient Instructions" section below.  When available, results of the above investigations and possible further recommendations will be communicated to the patient via telephone/MyChart. Patient to call office if not contacted after expected testing turnaround time.   Total time spent reviewing records, interview, history/exam, documentation, and coordination of care on day of encounter:  75 min   Thank you for  allowing me to participate in patient's care.  If I can answer any additional questions, I would be pleased to do so.  Kai Levins, MD   CC: Biagio Borg, [place] [address]  CC: Referring provider: Biagio Borg, MD [address] [place],  [place] [address]

## 2022-08-17 ENCOUNTER — Other Ambulatory Visit (INDEPENDENT_AMBULATORY_CARE_PROVIDER_SITE_OTHER): Payer: Medicare Other

## 2022-08-17 ENCOUNTER — Encounter: Payer: Self-pay | Admitting: Neurology

## 2022-08-17 ENCOUNTER — Ambulatory Visit
Admission: RE | Admit: 2022-08-17 | Discharge: 2022-08-17 | Disposition: A | Discharge status: Home / Self Care | Payer: Medicare Other | Source: Ambulatory Visit | Attending: Internal Medicine | Admitting: Internal Medicine

## 2022-08-17 ENCOUNTER — Ambulatory Visit (INDEPENDENT_AMBULATORY_CARE_PROVIDER_SITE_OTHER): Payer: Medicare Other | Admitting: Neurology

## 2022-08-17 VITALS — BP 123/71 | HR 88 | Ht 64.0 in | Wt 197.0 lb

## 2022-08-17 DIAGNOSIS — M25551 Pain in right hip: Secondary | ICD-10-CM

## 2022-08-17 DIAGNOSIS — M5416 Radiculopathy, lumbar region: Secondary | ICD-10-CM

## 2022-08-17 DIAGNOSIS — M5442 Lumbago with sciatica, left side: Secondary | ICD-10-CM

## 2022-08-17 DIAGNOSIS — R200 Anesthesia of skin: Secondary | ICD-10-CM

## 2022-08-17 DIAGNOSIS — G8929 Other chronic pain: Secondary | ICD-10-CM

## 2022-08-17 DIAGNOSIS — R202 Paresthesia of skin: Secondary | ICD-10-CM | POA: Diagnosis not present

## 2022-08-17 DIAGNOSIS — M25562 Pain in left knee: Secondary | ICD-10-CM

## 2022-08-17 DIAGNOSIS — M25552 Pain in left hip: Secondary | ICD-10-CM

## 2022-08-17 DIAGNOSIS — Z1231 Encounter for screening mammogram for malignant neoplasm of breast: Secondary | ICD-10-CM

## 2022-08-17 DIAGNOSIS — M5441 Lumbago with sciatica, right side: Secondary | ICD-10-CM

## 2022-08-17 LAB — VITAMIN B12: Vitamin B-12: 300 pg/mL (ref 211–911)

## 2022-08-17 MED ORDER — DULOXETINE HCL 30 MG PO CPEP: 60.0000 mg | ORAL_CAPSULE | Freq: Two times a day (BID) | ORAL | 5 refills | Status: DC

## 2022-08-17 NOTE — Progress Notes (Signed)
Frances Nelson D.[place] Phone: [phone]   Assessment and Plan:     1. Chronic pain of left knee 2. Primary osteoarthritis of left knee 3. 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  Symptoms are still consistent with complex degenerative tear of posterior horn medial meniscus - We will continue conservative therapy with injections as needed and HEP.  If conservative therapy becomes insufficient for symptom and pain control, we could refer to orthopedic surgery - Encourage patient to continue workup for bilateral lower extremity numbness and tingling with neurology who is currently planning on lab work and repeat EMG  Pertinent previous records reviewed include neurology note 08/17/2022   Follow Up: 2 months after neurology visit for reevaluation.  Could consider repeat Zilretta at that time   Subjective:   I, [name], am serving as a Education administrator for Doctor Glennon Mac   Chief Complaint: left sided pain    HPI:  10/03/2021 Patient is a 67 year old female complaining of left ankle, left leg, and low back pain. Patient states that her left sided pain started 2015 hasnt been getting any better , Achilles tendonitis bilateral left is more painful, left knee pain , fell down the stairs feb 7th and caught herself her legs were underneath her in a dive position her knee had given out on her. Has picture of her ankle from her fall, low back pain is all the way across pain is mostly in the middle , has numbness and tingling her toes are always numb gets weird sensations , feels like ice cold water is dripping down the back of her leg,  gets really sharp pains on her legs and feet, was taking ib for the swelling but has had reactions with it nausea, seems to work in reverse the ib makes the swelling worst, takes tylenol arthritis 650 mg that seems to help a little, when she takes she is able to sleep at night. Pt says she has a lump on her left heel and left knee and a cyst in the back of her knee   10/24/2021 Patient states that today her legs are killing her, had improvement until it got cold again and then the pain came back    10/28/2021 Patient states that she still hs pain in her legs and feet, toes are still numb, beginning of march she fell down the stairs and her left foot 3 great toe down are still bruised look like blood blisters , still getting sharp pains    11/17/2021 Patient states had epidural and some of the swelling is gone and the numbness in legs are gone but the pain is now more intense. Pain in the shoulders radiating down in both arms to the elbows. These pains have started only since the epidural. Also has had a headache and can get more intense. Sometimes also will get sporadic pain in foot that causes a reflex reaction.   12/09/2021 Patient states that she is walking without her cane the pain is better,  feet and ankle still bother her thinks it more from arthritis,  isn't having the leg pain, was able to lay on the ground for the first time in  6 years, was able to raise single leg got pain when she tried to raise both legs    04/12/2022 Patient states that  she still has left sided pain , also bilateral feet , toes and up to mid calf is numb and hurt 24/7, knee she has a lump that is hard and then she has a baker cyst on the back of her leg    04/25/2022 Patient states she has a pain in the [place] knows it coming from the back today its acting up   05/04/2022 Patient states ready for HA injection , surgeon never got back to her about the injection    06/27/2022 Patient states not doing real great had back  surgery and was bleeding for 23 days and still oozing, will be going to neurology and PT , is in pain     07/07/2022 Patient states that she is the same    07/20/2022 Patient presents today for procedure only visit zilretta injection to left knee.   08/18/2022 Patient states that the shot did help but she is having some pain not as constant , went to the neurologist and they are going to do an EMG    Relevant Historical Information: Positive ANA in 2019, but no recent follow-up with rheumatology, chronic pain in multiple locations, hypertension, COPD  Additional pertinent review of systems negative.   Current Outpatient Medications:    acetaminophen (TYLENOL) 650 MG CR tablet, Take 1,300 mg by mouth in the morning and at bedtime., Disp: , Rfl:    acidophilus (RISAQUAD) CAPS capsule, Take 1 capsule by mouth in the morning., Disp: , Rfl:    albuterol (PROVENTIL) (2.5 MG/3ML) 0.083% nebulizer solution, USE 1 VIAL IN NEBULIZER EVERY 6HRS FOR SHORTNESS OF BREATH/WHEEZING, Disp: 300 mL, Rfl: 5   albuterol (VENTOLIN HFA) 108 (90 Base) MCG/ACT inhaler, INHALE 2 PUFFS BY MOUTH EVERY 6 HOURS AS NEEDED FOR WHEEZING/SHORTNESS OF BREATH, Disp: 18 each, Rfl: 11   Calcium-Vitamins C & D (CALCIUM/C/D PO), Take 1 tablet by mouth in the morning., Disp: , Rfl:    cetirizine (ZYRTEC) 10 MG tablet, Take 10 mg by mouth in the morning., Disp: , Rfl:    Cholecalciferol (HM VITAMIN D3 PO), Take 5,000 Units by mouth in the morning., Disp: , Rfl:    COLLAGEN PO, Take by mouth. Collagen Peptides, Disp: , Rfl:    DULoxetine (CYMBALTA) 30 MG capsule, Take 2 capsules (60 mg total) by mouth 2 (two) times daily., Disp: 120 capsule, Rfl: 5   gabapentin (NEURONTIN) 100 MG capsule, TAKE 1 CAPSULE (100 MG TOTAL) BY MOUTH THREE TIMES DAILY., Disp: 270 capsule, Rfl: 1   hydrochlorothiazide (HYDRODIURIL) 12.5 MG tablet, TAKE 1 TABLET BY MOUTH EVERY DAY, Disp: 90 tablet, Rfl: 2   LORazepam (ATIVAN) 0.5 MG tablet, 1-2 tabs 30 - 60  min prior to MRI. Do not drive with this medicine., Disp: 4 tablet, Rfl: 0   losartan (COZAAR) 25 MG tablet, TAKE 1 TABLET (25 MG TOTAL) BY MOUTH DAILY., Disp: 90 tablet, Rfl: 2   MELATONIN PO, Take 5 mg by mouth at bedtime as needed (sleep)., Disp: , Rfl:    meloxicam (MOBIC) 15 MG tablet, Take 0.5 tablets (7.5 mg total) by mouth daily. (Patient taking differently: Take 7.5 mg by mouth daily as needed for pain.), Disp: 60 tablet, Rfl: 0   methocarbamol (ROBAXIN) 750 MG tablet, TAKE 1 TABLET (750 MG TOTAL) BY MOUTH EVERY 6 (SIX) HOURS AS NEEDED FOR MUSCLE SPASMS (NOT COVERED), Disp:  120 tablet, Rfl: 2   mometasone (NASONEX) 50 MCG/ACT nasal spray, Place 2 sprays into the nose every evening., Disp: , Rfl:    montelukast (SINGULAIR) 10 MG tablet, TAKE 1 TABLET BY MOUTH EVERYDAY AT BEDTIME, Disp: 90 tablet, Rfl: 3   Multiple Vitamins-Minerals (MULTIVITAMIN WITH MINERALS) tablet, Take 1 tablet by mouth in the morning., Disp: , Rfl:    omeprazole (PRILOSEC) 40 MG capsule, TAKE 1 CAPSULE (40 MG TOTAL) BY MOUTH IN THE MORNING AND AT BEDTIME., Disp: 180 capsule, Rfl: 3   Objective:     Vitals:   08/18/22 1136  Pulse: 78  SpO2: 97%  Weight: 192 lb (87.1 kg)  Height: '5\' 4"'$  (1.626 m)      Body mass index is 32.96 kg/m.    Physical Exam:    General:  awake, alert oriented, no acute distress nontoxic Skin: no suspicious lesions or rashes Neuro:sensation intact, no deficits, strength 5/5 with no deficits, no atrophy, normal muscle tone Psych: No signs of anxiety, depression or other mood disorder   Left knee: No effusion Mild Baker's cyst present No deformity ROM Flex 100, Ext 5 TTP quadriceps tendon, medial femoral condyle, medial joint line, lateral joint line NTTP over the  lat fem condyle, patella, plica, patella tendon, tibial tuberostiy, fibular head, posterior fossa, pes anserine bursa, gerdy's tubercle  Neg anterior and posterior drawer Neg lachman Neg sag sign  Electronically  signed by:  Frances Nelson D.[facility] 11:52 AM 08/18/22

## 2022-08-17 NOTE — Patient Instructions (Signed)
I would like to investigate your symptoms further with the following: -Blood work today -Muscle and nerve test called an EMG (see more information below)  I will be in touch when I have your results.  You can increase your Cymbalta to 30 mg in the morning and 60 mg at night. Can increase to 60 mg twice daily if needed.  Recommend the following measures that may provide some symptomatic benefit for muscle cramps: - Adequate oral clear fluid intake to maintain optimal hydration (about 2.5 liters, or around 8-10 glasses per day) Avoidance of caffeine Trial of DIET tonic water: About 1 glass, up to 6 times daily Magnesium oxide up to 400 mg by mouth twice daily, as needed (over the counter) Gentle muscle stretching routine, especially before bedtime   I would like to see you back in clinic in 2 months.  Please let me know if you have any questions or concerns in the meantime.  The physicians and staff at [facility] are committed to providing excellent care. You may receive a survey requesting feedback about your experience at our office. We strive to receive "very good" responses to the survey questions. If you feel that your experience would prevent you from giving the office a "very good " response, please contact our office to try to remedy the situation. We may be reached at [phone]. Thank you for taking the time out of your busy day to complete the survey.  Kai Levins, MD [facility]  ELECTROMYOGRAM AND NERVE CONDUCTION STUDIES (EMG/NCS) INSTRUCTIONS  How to Prepare The neurologist conducting the EMG will need to know if you have certain medical conditions. Tell the neurologist and other EMG lab personnel if you: Have a pacemaker or any other electrical medical device Take blood-thinning medications Have hemophilia, a blood-clotting disorder that causes prolonged bleeding Bathing Take a shower or bath shortly before your exam in order to remove oils from your skin.  Don't apply lotions or creams before the exam.  What to Expect You'll likely be asked to change into a hospital gown for the procedure and lie down on an examination table. The following explanations can help you understand what will happen during the exam.  Electrodes. The neurologist or a technician places surface electrodes at various locations on your skin depending on where you're experiencing symptoms. Or the neurologist may insert needle electrodes at different sites depending on your symptoms.  Sensations. The electrodes will at times transmit a tiny electrical current that you may feel as a twinge or spasm. The needle electrode may cause discomfort or pain that usually ends shortly after the needle is removed. If you are concerned about discomfort or pain, you may want to talk to the neurologist about taking a short break during the exam.  Instructions. During the needle EMG, the neurologist will assess whether there is any spontaneous electrical activity when the muscle is at rest - activity that isn't present in healthy muscle tissue - and the degree of activity when you slightly contract the muscle.  He or she will give you instructions on resting and contracting a muscle at appropriate times. Depending on what muscles and nerves the neurologist is examining, he or she may ask you to change positions during the exam.  After your EMG You may experience some temporary, minor bruising where the needle electrode was inserted into your muscle. This bruising should fade within several days. If it persists, contact your primary care doctor.   Preventing Falls at [facility]  Falls are common, often dreaded events in the lives of older people. Aside from the obvious injuries and even death that may result, fall can cause wide-ranging consequences including loss of independence, mental decline, decreased activity and mobility. Younger people are also at risk of falling, especially those with chronic  illnesses and fatigue.  Ways to reduce risk for falling Examine diet and medications. Warm foods and alcohol dilate blood vessels, which can lead to dizziness when standing. Sleep aids, antidepressants and pain medications can also increase the likelihood of a fall.  Get a vision exam. Poor vision, cataracts and glaucoma increase the chances of falling.  Check foot gear. Shoes should fit snugly and have a sturdy, nonskid sole and a broad, low heel  Participate in a physician-approved exercise program to build and maintain muscle strength and improve balance and coordination. Programs that use ankle weights or stretch bands are excellent for muscle-strengthening. Water aerobics programs and low-impact Tai Chi programs have also been shown to improve balance and coordination.  Increase vitamin D intake. Vitamin D improves muscle strength and increases the amount of calcium the body is able to absorb and deposit in bones.  How to prevent falls from common hazards Floors - Remove all loose wires, cords, and throw rugs. Minimize clutter. Make sure rugs are anchored and smooth. Keep furniture in its usual place.  Chairs -- Use chairs with straight backs, armrests and firm seats. Add firm cushions to existing pieces to add height.  Bathroom - Install grab bars and non-skid tape in the tub or shower. Use a bathtub transfer bench or a shower chair with a back support Use an elevated toilet seat and/or safety rails to assist standing from a low surface. Do not use towel racks or bathroom tissue holders to help you stand.  Lighting - Make sure halls, stairways, and entrances are well-lit. Install a night light in your bathroom or hallway. Make sure there is a light switch at the top and bottom of the staircase. Turn lights on if you get up in the middle of the night. Make sure lamps or light switches are within reach of the bed if you have to get up during the night.  Kitchen - Install non-skid rubber  mats near the sink and stove. Clean spills immediately. Store frequently used utensils, pots, pans between waist and eye level. This helps prevent reaching and bending. Sit when getting things out of lower cupboards.  Living room/ Bedrooms - Place furniture with wide spaces in between, giving enough room to move around. Establish a route through the living room that gives you something to hold onto as you walk.  Stairs - Make sure treads, rails, and rugs are secure. Install a rail on both sides of the stairs. If stairs are a threat, it might be helpful to arrange most of your activities on the lower level to reduce the number of times you must climb the stairs.  Entrances and doorways - Install metal handles on the walls adjacent to the doorknobs of all doors to make it more secure as you travel through the doorway.  Tips for maintaining balance Keep at least one hand free at all times. Try using a backpack or fanny pack to hold things rather than carrying them in your hands. Never carry objects in both hands when walking as this interferes with keeping your balance.  Attempt to swing both arms from front to back while walking. This might require a conscious effort if Parkinson's disease  has diminished your movement. It will, however, help you to maintain balance and posture, and reduce fatigue.  Consciously lift your feet off of the ground when walking. Shuffling and dragging of the feet is a common culprit in losing your balance.  When trying to navigate turns, use a "U" technique of facing forward and making a wide turn, rather than pivoting sharply.  Try to stand with your feet shoulder-length apart. When your feet are close together for any length of time, you increase your risk of losing your balance and falling.  Do one thing at a time. Don't try to walk and accomplish another task, such as reading or looking around. The decrease in your automatic reflexes complicates motor function, so the  less distraction, the better.  Do not wear rubber or gripping soled shoes, they might "catch" on the floor and cause tripping.  Move slowly when changing positions. Use deliberate, concentrated movements and, if needed, use a grab bar or walking aid. Count 15 seconds between each movement. For example, when rising from a seated position, wait 15 seconds after standing to begin walking.  If balance is a continuous problem, you might want to consider a walking aid such as a cane, walking stick, or walker. Once you've mastered walking with help, you might be ready to try it on your own again.

## 2022-08-18 ENCOUNTER — Ambulatory Visit (INDEPENDENT_AMBULATORY_CARE_PROVIDER_SITE_OTHER): Payer: Medicare Other | Admitting: Sports Medicine

## 2022-08-18 VITALS — HR 78 | Ht 64.0 in | Wt 192.0 lb

## 2022-08-18 DIAGNOSIS — M1712 Unilateral primary osteoarthritis, left knee: Secondary | ICD-10-CM | POA: Diagnosis not present

## 2022-08-18 DIAGNOSIS — G8929 Other chronic pain: Secondary | ICD-10-CM | POA: Diagnosis not present

## 2022-08-18 DIAGNOSIS — M25562 Pain in left knee: Secondary | ICD-10-CM | POA: Diagnosis not present

## 2022-08-18 DIAGNOSIS — M23322 Other meniscus derangements, posterior horn of medial meniscus, left knee: Secondary | ICD-10-CM | POA: Diagnosis not present

## 2022-08-18 NOTE — Patient Instructions (Addendum)
Good to see you  2 month follow up  Continue work up with neurology

## 2022-08-22 LAB — VITAMIN B1: Vitamin B1 (Thiamine): 10 nmol/L (ref 8–30)

## 2022-08-22 LAB — IMMUNOFIXATION ELECTROPHORESIS
IgG (Immunoglobin G), Serum: 1128 mg/dL (ref 600–1540)
IgM, Serum: 94 mg/dL (ref 50–300)
Immunoglobulin A: 144 mg/dL (ref 70–320)

## 2022-09-01 ENCOUNTER — Encounter: Payer: Self-pay | Admitting: *Deleted

## 2022-09-05 ENCOUNTER — Telehealth: Payer: Self-pay | Admitting: Neurology

## 2022-09-05 ENCOUNTER — Ambulatory Visit (INDEPENDENT_AMBULATORY_CARE_PROVIDER_SITE_OTHER): Payer: Medicare Other | Admitting: Neurology

## 2022-09-05 DIAGNOSIS — G8929 Other chronic pain: Secondary | ICD-10-CM

## 2022-09-05 DIAGNOSIS — M5416 Radiculopathy, lumbar region: Secondary | ICD-10-CM

## 2022-09-05 DIAGNOSIS — M25551 Pain in right hip: Secondary | ICD-10-CM

## 2022-09-05 DIAGNOSIS — G629 Polyneuropathy, unspecified: Secondary | ICD-10-CM

## 2022-09-05 DIAGNOSIS — R200 Anesthesia of skin: Secondary | ICD-10-CM

## 2022-09-05 DIAGNOSIS — M25562 Pain in left knee: Secondary | ICD-10-CM

## 2022-09-05 DIAGNOSIS — R202 Paresthesia of skin: Secondary | ICD-10-CM

## 2022-09-05 NOTE — Procedures (Signed)
[facility]  [place], [address]  [place], [place] [address] Tel: [phone] Fax: [phone] Test Date:  09/05/2022  Patient: Frances Nelson DOB: 1956/01/20 Physician: Kai Levins, MD  Sex: Female Height: 5' 4"$  Ref Phys: Kai Levins, MD  ID#: [id number]   Technician:    History: This is a 67 year old female with numbness and tingling of the legs.  NCV & EMG Findings: Extensive electrodiagnostic evaluation of the left lower limb with additional nerve conduction studies of the right lower limb shows: Bilateral sural and superficial peroneal/fibular sensory responses are absent. Left median sensory response shows prolonged distal peak latency (4.3 ms). Left ulnar sensory response is within normal limits. Left tibial (AH) motor response shows prolonged distal onset latency (6.4 ms) and reduced amplitude (1.61 mV). Left peroneal/fibular (EDB), left median (APB), and left ulnar (ADM) motor responses are within normal limits.  Left H reflex latency is within normal limits. Chronic motor axon loss changes WITH active denervation changes are seen in the left extensor digitorum brevis and abductor hallucis muscles. Chronic motor axon loss changes WITHOUT active denervation changes are seen in the left tibialis anterior, flexor digitorum longus, and gluteus medius muscles.  Impression: This is an abnormal study. 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 Evidence of a left median mononeuropathy at or distal to the wrist, consistent with carpal tunnel syndrome, mild in degree electrically.    ___________________________ Kai Levins, MD    Nerve Conduction Studies Motor Nerve Results    Latency Amplitude F-Lat  Segment Distance CV Comment  Site (ms) Norm (mV) Norm (ms)  (cm) (m/s) Norm   Left Fibular (EDB) Motor  Ankle 4.2  < 6.0 3.5  > 2.5        Bel fib head 10.7 - 3.0 -  Bel fib head-Ankle 29 45  > 40   Pop fossa 12.9 - 3.0 -  Pop fossa-Bel fib head 10 45 -   Left Median (APB) Motor  Wrist 4.0  < 4.0 9.1  > 5.0        Elbow 8.6 - 8.8 -  Elbow-Wrist 24.5 53  > 50   Left Tibial (AH) Motor  Ankle *6.4  < 6.0 *1.61  > 4.0        Knee 12.9 - 1.01 -  Knee-Ankle 37 57  > 40    Sensory Sites    Neg Peak Lat Amplitude (O-P) Segment Distance Velocity Comment  Site (ms) Norm (V) Norm  (cm) (ms)   Left Median Sensory  Wrist-Dig II *4.3  < 3.8 18  > 10 Wrist-Dig II 13    Left Superficial Fibular Sensory  14 cm-Ankle *NR  < 4.6 *NR  > 3 14 cm-Ankle 14    Right Superficial Fibular Sensory  14 cm-Ankle *NR  < 4.6 *NR  > 3 14 cm-Ankle 14    Left Sural Sensory  Calf-Lat mall *NR  < 4.6 *NR  > 3 Calf-Lat mall 14    Right Sural Sensory  Calf-Lat mall *NR  < 4.6 *NR  > 3 Calf-Lat mall 14    Left Ulnar Sensory  Wrist-Dig V 3.1  < 3.2 31  > 5 Wrist-Dig V 11     H-Reflex Results  M-Lat H Lat H Neg Amp H-M Lat  Site (ms) (ms) Norm (mV) (ms)  Left Tibial H-Reflex  Pop fossa 6.3 32.1  < 35.0 3.4 25.8   Electromyography   Side Muscle Ins.Act Fibs Fasc Recrt Amp Dur Poly Activation Comment  Left Tib ant Nml Nml Nml *1- *1+ *1+ Nml Nml N/A  Left Gastroc MH Nml Nml Nml Nml Nml Nml Nml Nml N/A  Left FDL Nml Nml Nml *2- *1+ *1+ Nml Nml N/A  Left AH Nml *1+ Nml *SMU Nml *1+ *1+ Nml N/A  Left EDB Nml *1+ Nml *3- *1+ *1+ *1+ Nml N/A  Left Rectus fem Nml Nml Nml Nml Nml Nml Nml Nml N/A  Left Biceps fem SH Nml Nml Nml Nml Nml Nml Nml Nml N/A  Left Gluteus med Nml Nml Nml Nml Nml Nml Nml Nml N/A      Waveforms:  Motor        Sensory               H-Reflex

## 2022-09-05 NOTE — Telephone Encounter (Signed)
Discussed the results of patient's EMG after the procedure today. 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   Patient's known risk factors for neuropathy include prior pre-diabetes (HbA1c most recently 5.5 but 6.2 in the past) and borderline low B12, for which supplementation has been recommended.  Patient will follow up with me as planned.  All questions were answered.  Kai Levins, MD [facility]

## 2022-09-12 ENCOUNTER — Other Ambulatory Visit: Payer: Self-pay | Admitting: Neurology

## 2022-09-12 DIAGNOSIS — R200 Anesthesia of skin: Secondary | ICD-10-CM

## 2022-09-12 DIAGNOSIS — M5416 Radiculopathy, lumbar region: Secondary | ICD-10-CM

## 2022-09-17 IMAGING — DX DG ANKLE COMPLETE 3+V*L*
3 series · 3 of 3 positions shown · non-contrast
Comparison: None.

CLINICAL DATA: Trauma, fall

EXAM:
LEFT ANKLE COMPLETE - 3+ VIEW

[ankle ap]
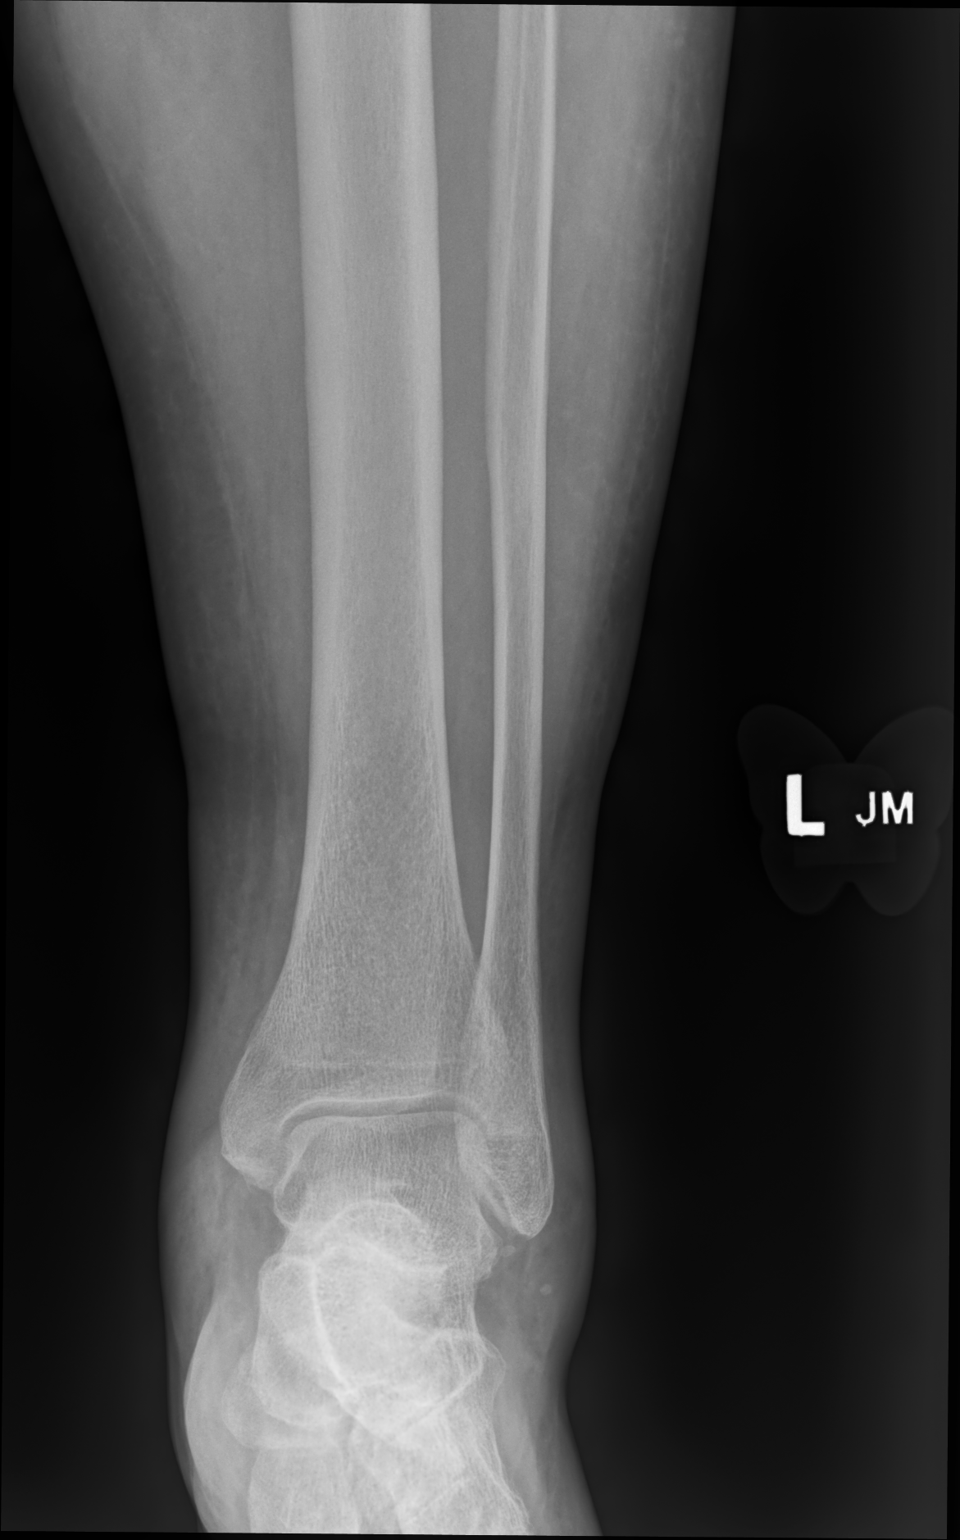

[ankle medial oblique]
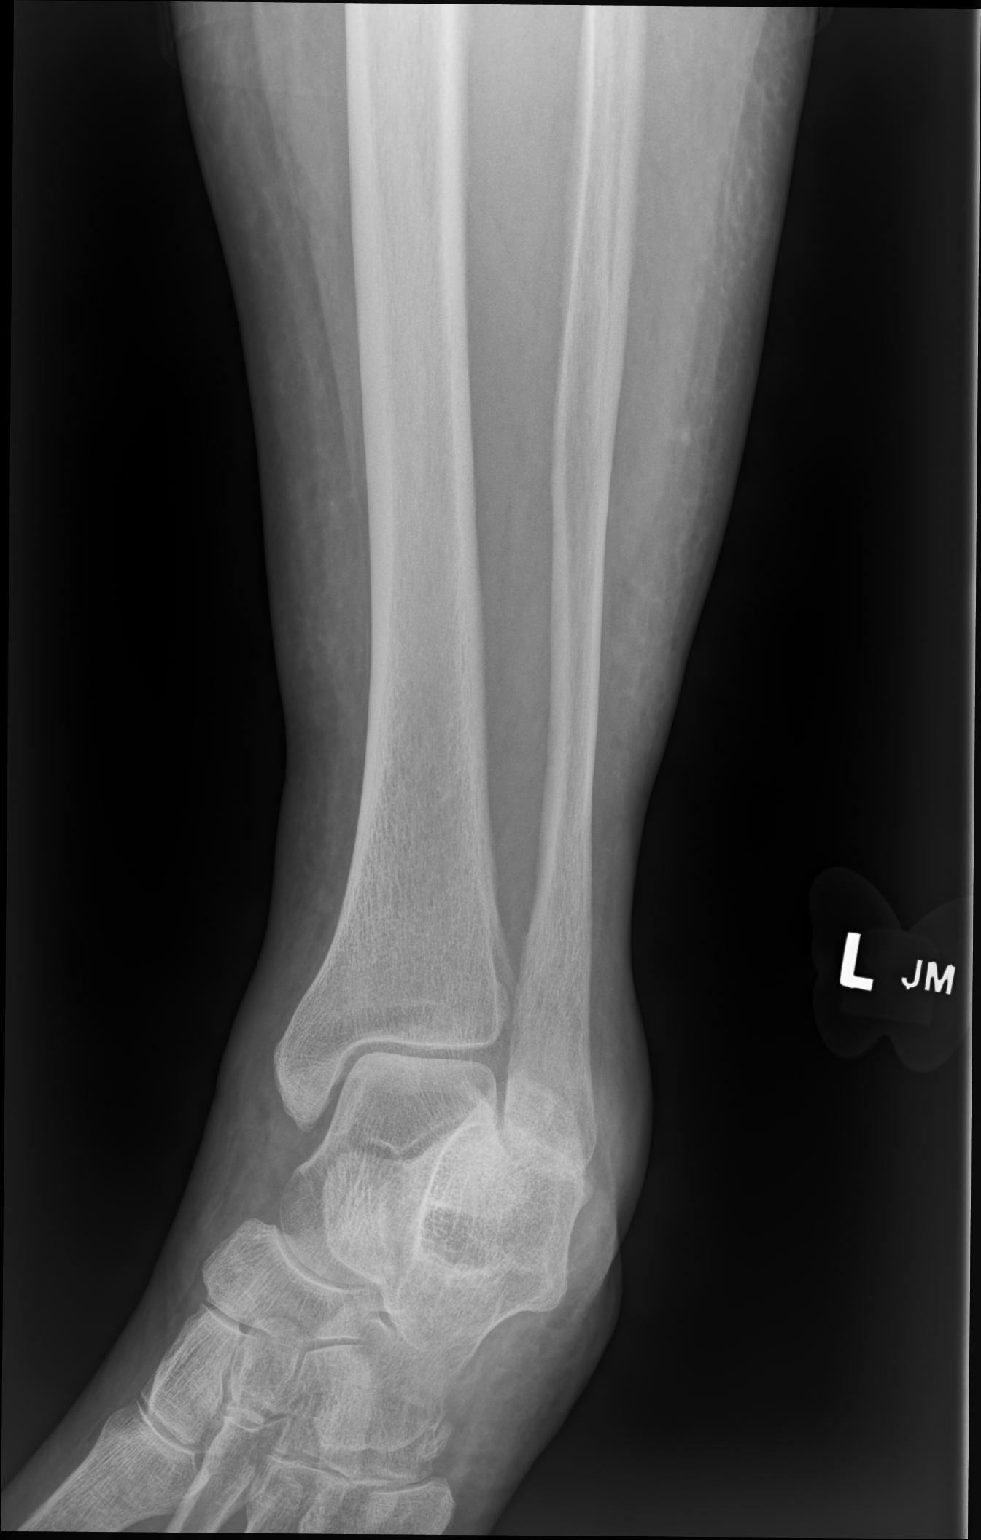

[ankle lat]
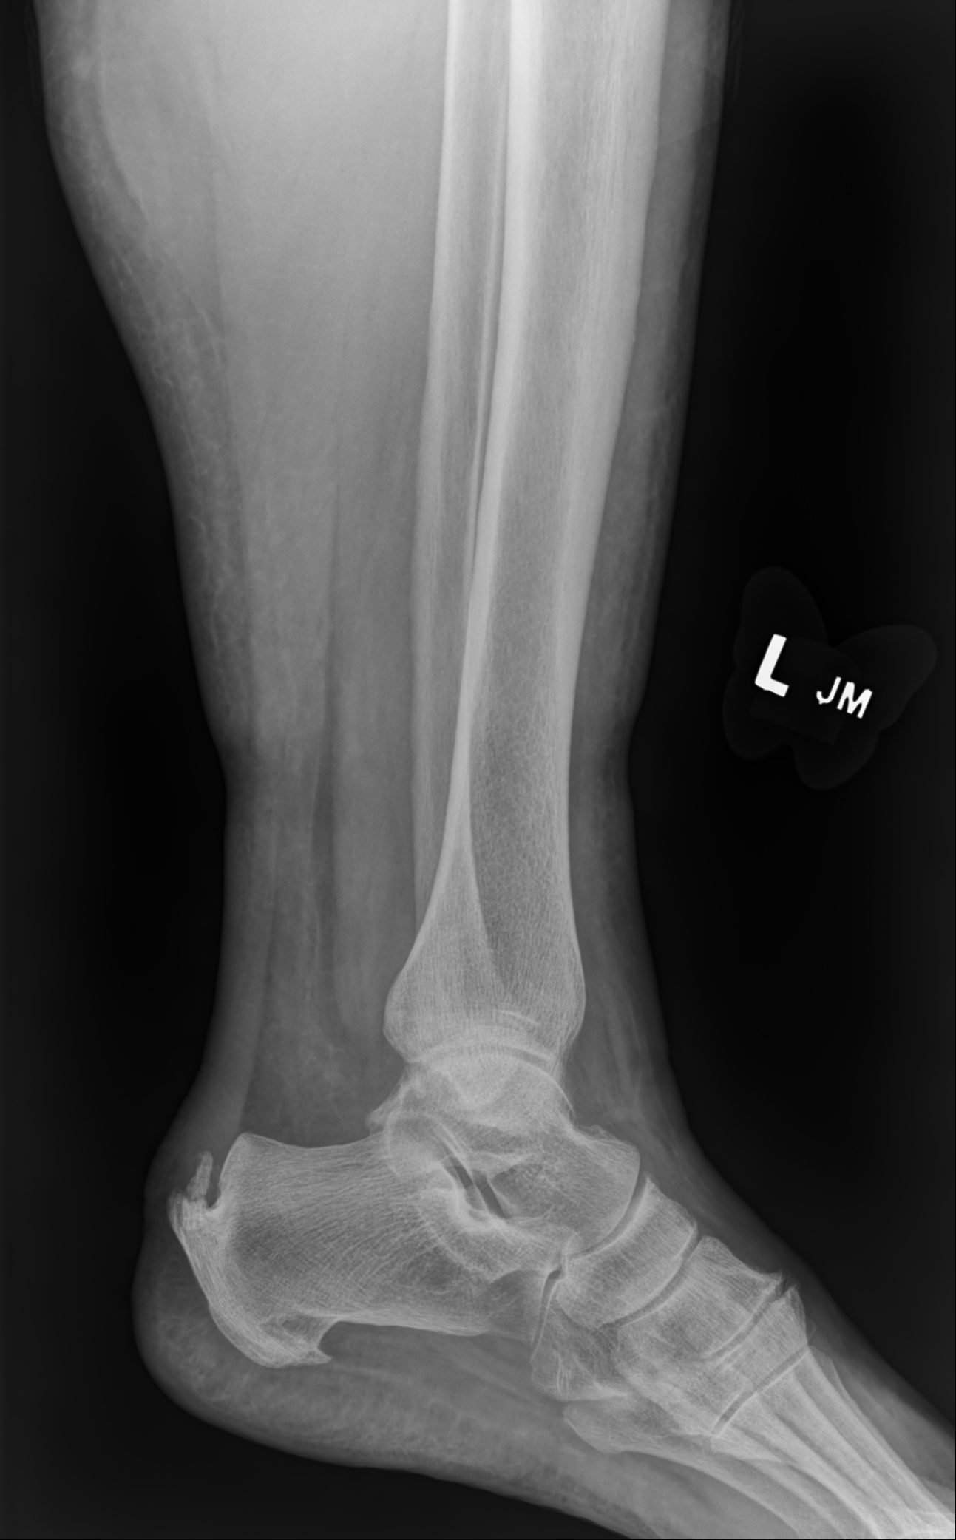

[3 of 3 positions shown; findings below may reference images not displayed]

FINDINGS: No fracture or dislocation is seen. There is soft tissue swelling
over the medial and lateral malleoli. Plantar spur is seen in
calcaneus. There is smooth marginated coarse calcification in the
Achilles tendon close to the calcaneus.
IMPRESSION: No recent fracture or dislocation is seen in the left ankle. Small
plantar spur is seen in calcaneus. There is dense calcification in
the Achilles tendon close to the calcaneus suggesting calcific
tendinosis or bursitis.

## 2022-09-17 IMAGING — DX DG FOOT COMPLETE 3+V*R*
3 series · 3 of 3 positions shown · non-contrast
Comparison: None.

CLINICAL DATA: Trauma, fall

EXAM:
RIGHT FOOT COMPLETE - 3+ VIEW

[foot supine dp]
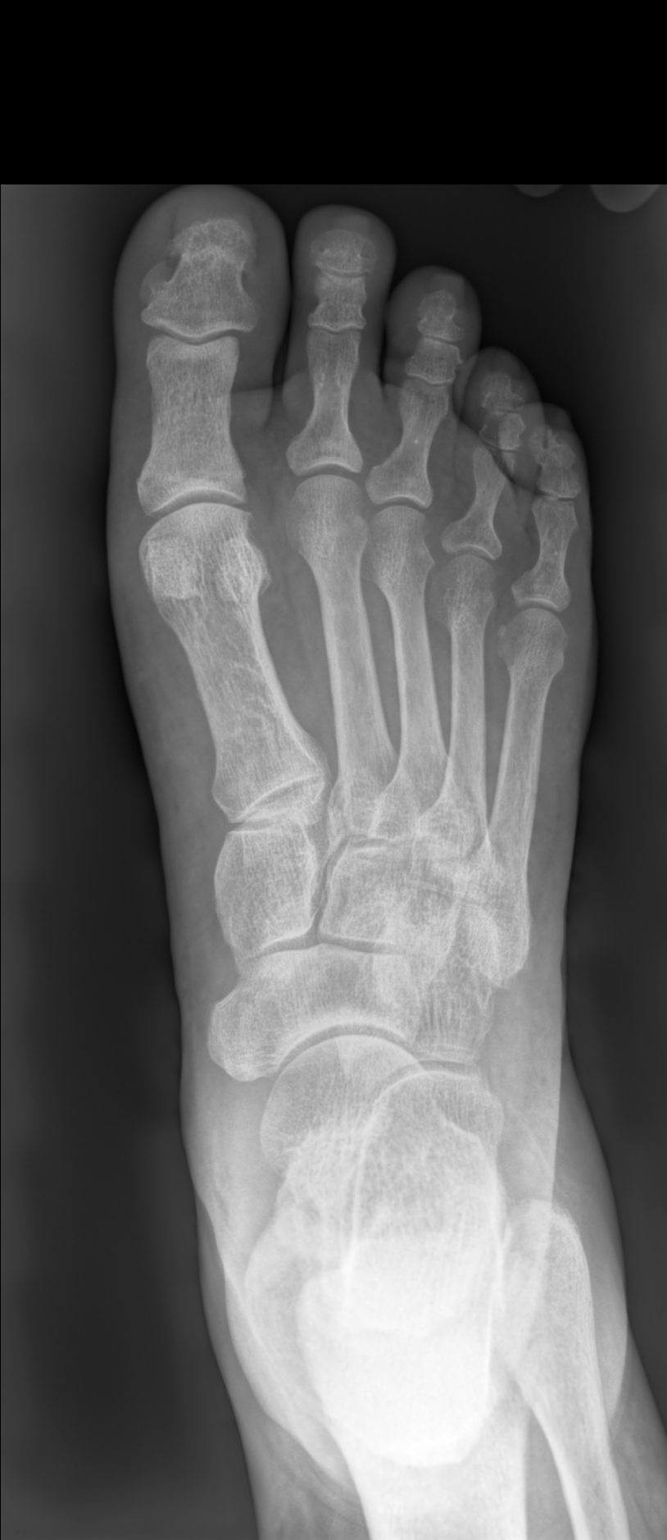

[foot medial oblique]
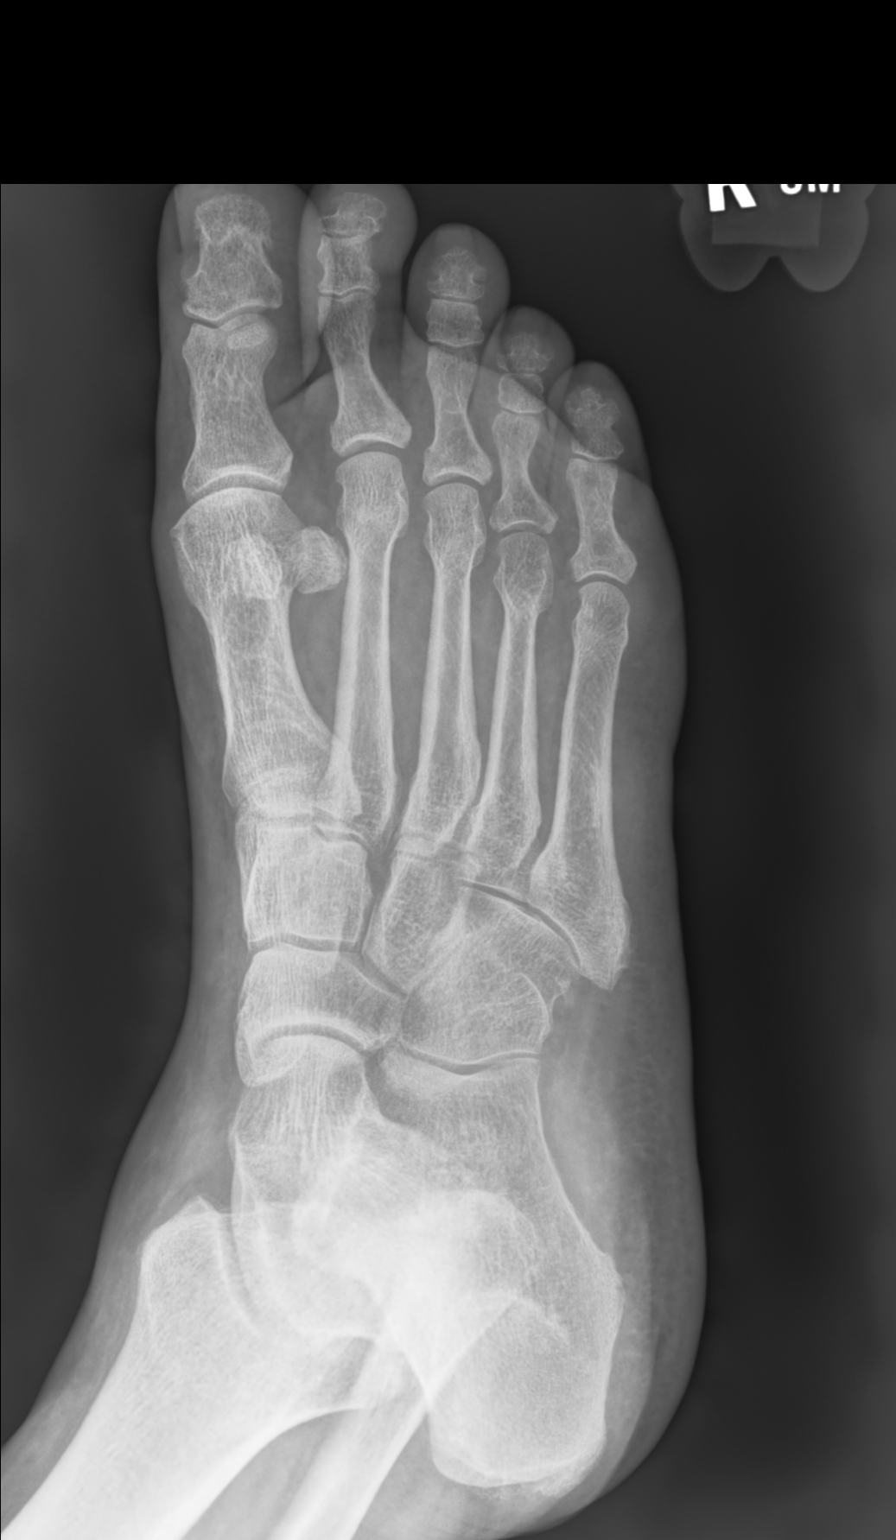

[foot supine lat]
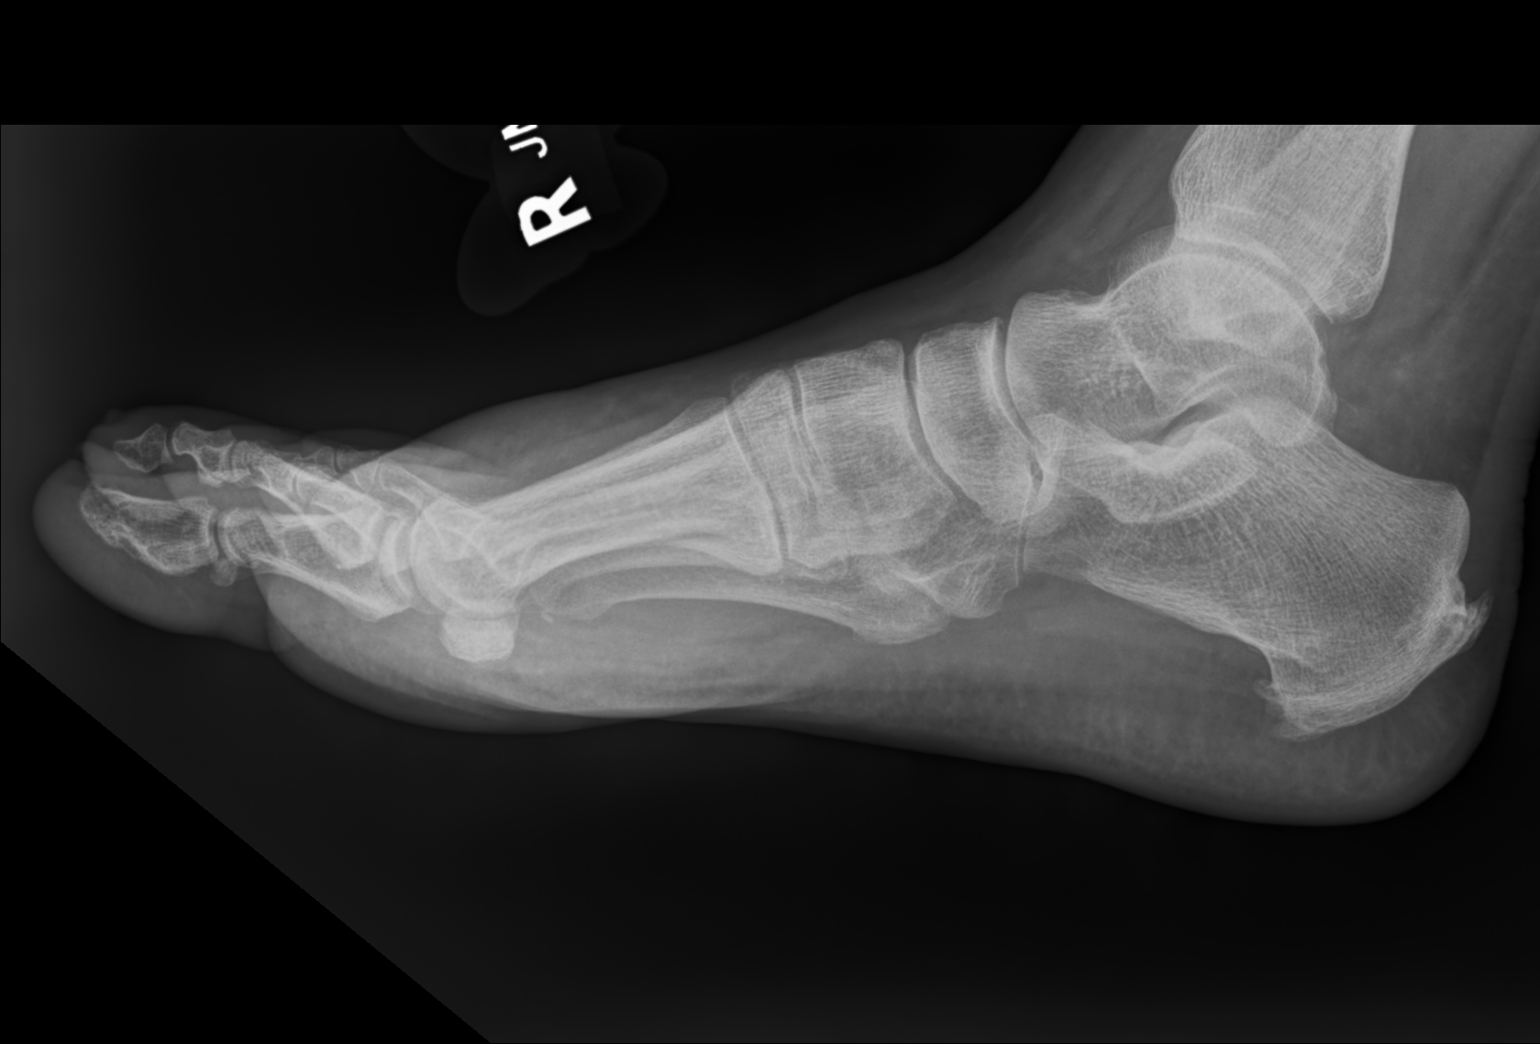

[3 of 3 positions shown; findings below may reference images not displayed]

FINDINGS: No recent fracture or dislocation is seen. Small plantar spur is
seen in calcaneus. Small coarse calcifications are seen at the
attachment of Achilles tendon to the calcaneus. Small bony spurs
seen in first metatarsophalangeal joint and base of fifth
tarsometatarsal joint.
IMPRESSION: No recent fracture or dislocation is seen in the right foot. Small
plantar spur is seen in calcaneus. Degenerative changes are noted in
the first metatarsophalangeal joint with small bony spurs.

## 2022-09-17 IMAGING — DX DG ANKLE COMPLETE 3+V*R*
3 series · 3 of 3 positions shown · non-contrast
Comparison: None.

CLINICAL DATA: Trauma, fall

EXAM:
RIGHT ANKLE - COMPLETE 3+ VIEW

[ankle ap]
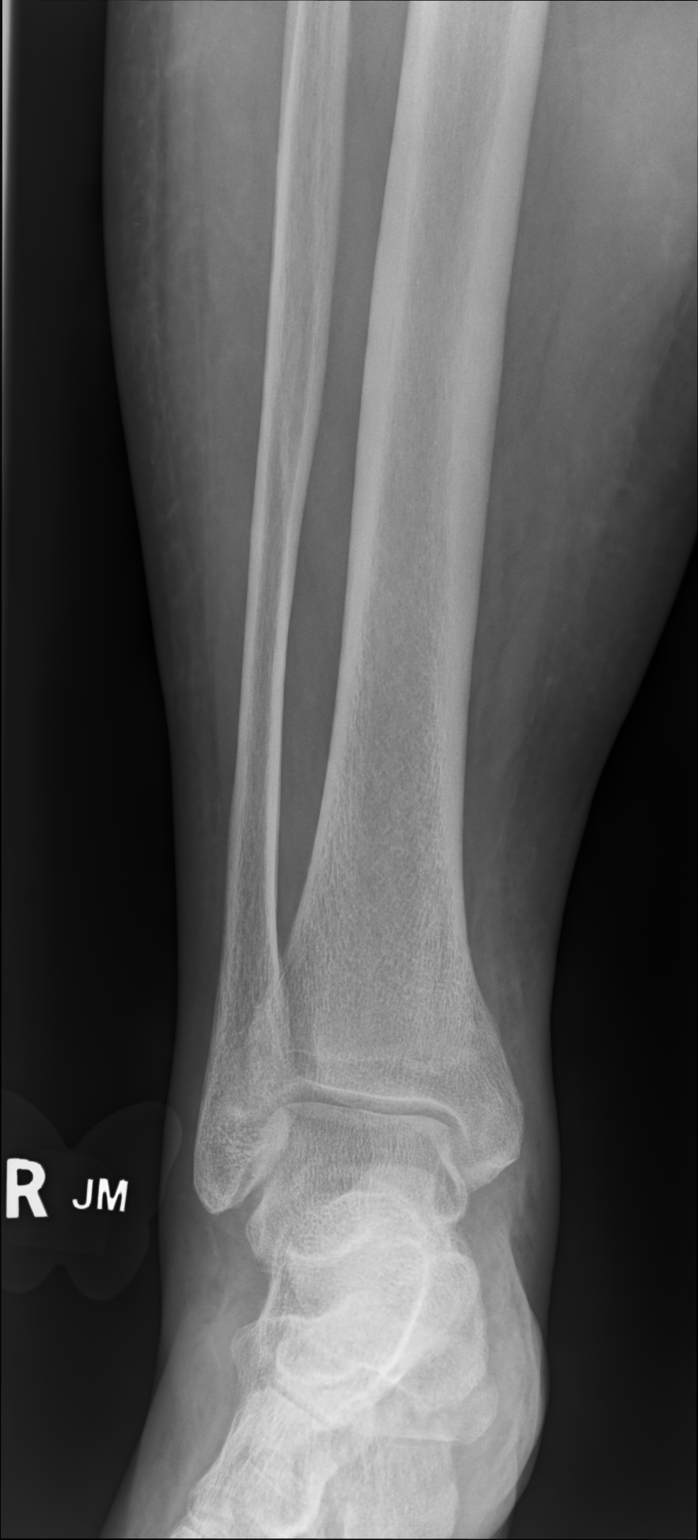

[ankle medial oblique]
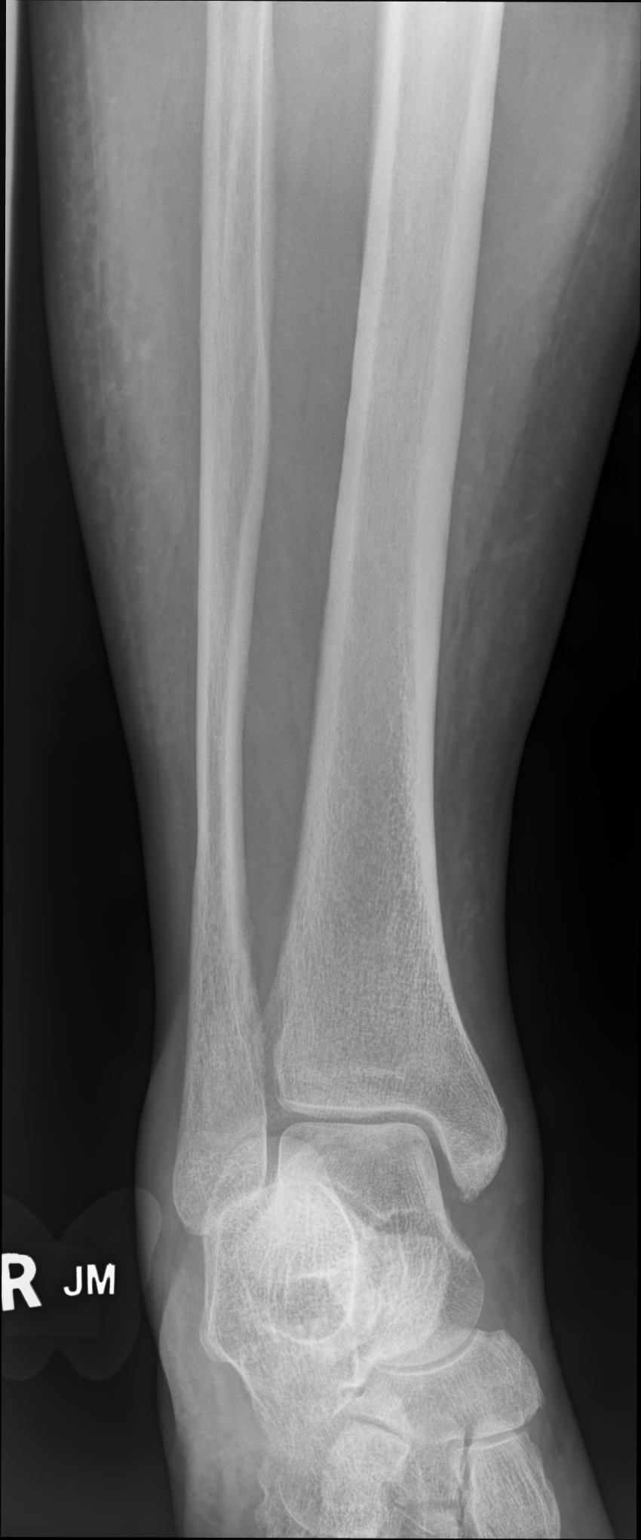

[ankle lat]
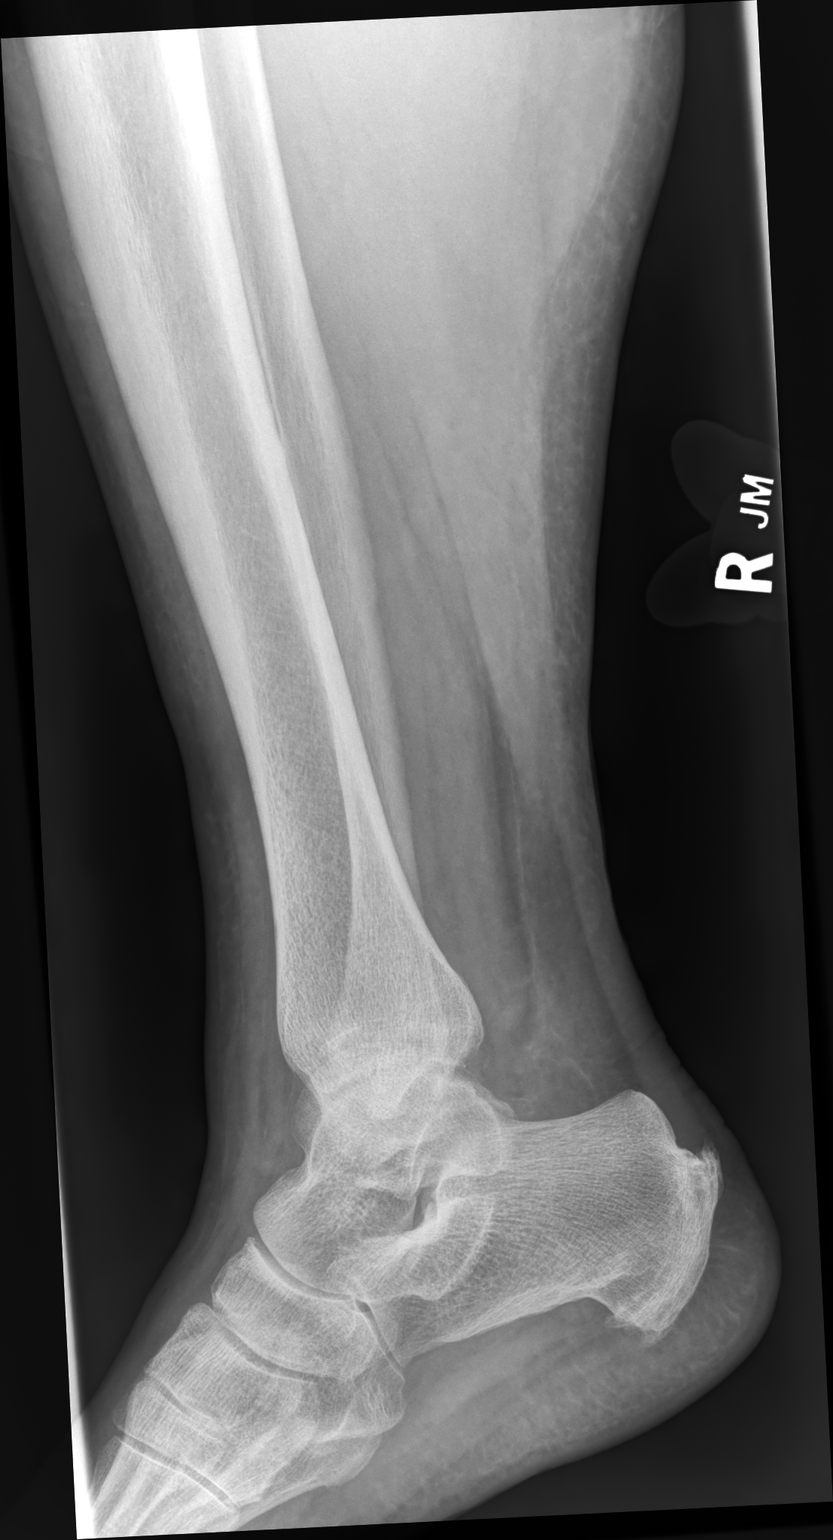

[3 of 3 positions shown; findings below may reference images not displayed]

FINDINGS: No recent fracture or dislocation is seen. Small plantar spur is
seen in calcaneus. There is small calcification at the attachment of
Achilles tendon to the calcaneus. This may suggest bursitis or
tendinosis.
IMPRESSION: No recent fracture or dislocation is seen in the right ankle. Small
plantar spur is seen in calcaneus.

## 2022-09-28 ENCOUNTER — Other Ambulatory Visit: Payer: Self-pay | Admitting: Internal Medicine

## 2022-09-28 ENCOUNTER — Other Ambulatory Visit: Payer: Self-pay | Admitting: Emergency Medicine

## 2022-10-05 ENCOUNTER — Ambulatory Visit (INDEPENDENT_AMBULATORY_CARE_PROVIDER_SITE_OTHER): Payer: Medicare Other | Admitting: Internal Medicine

## 2022-10-05 ENCOUNTER — Encounter: Payer: Self-pay | Admitting: Internal Medicine

## 2022-10-05 VITALS — BP 130/78 | HR 89 | Temp 98.4°F | Ht 64.0 in | Wt 205.0 lb

## 2022-10-05 DIAGNOSIS — E559 Vitamin D deficiency, unspecified: Secondary | ICD-10-CM | POA: Diagnosis not present

## 2022-10-05 DIAGNOSIS — E7800 Pure hypercholesterolemia, unspecified: Secondary | ICD-10-CM

## 2022-10-05 DIAGNOSIS — F32A Depression, unspecified: Secondary | ICD-10-CM

## 2022-10-05 DIAGNOSIS — R21 Rash and other nonspecific skin eruption: Secondary | ICD-10-CM

## 2022-10-05 DIAGNOSIS — R739 Hyperglycemia, unspecified: Secondary | ICD-10-CM | POA: Diagnosis not present

## 2022-10-05 DIAGNOSIS — G5601 Carpal tunnel syndrome, right upper limb: Secondary | ICD-10-CM

## 2022-10-05 DIAGNOSIS — I10 Essential (primary) hypertension: Secondary | ICD-10-CM

## 2022-10-05 DIAGNOSIS — H60502 Unspecified acute noninfective otitis externa, left ear: Secondary | ICD-10-CM | POA: Diagnosis not present

## 2022-10-05 DIAGNOSIS — M25562 Pain in left knee: Secondary | ICD-10-CM | POA: Diagnosis not present

## 2022-10-05 DIAGNOSIS — S83272D Complex tear of lateral meniscus, current injury, left knee, subsequent encounter: Secondary | ICD-10-CM

## 2022-10-05 DIAGNOSIS — H6092 Unspecified otitis externa, left ear: Secondary | ICD-10-CM | POA: Insufficient documentation

## 2022-10-05 DIAGNOSIS — F17200 Nicotine dependence, unspecified, uncomplicated: Secondary | ICD-10-CM

## 2022-10-05 DIAGNOSIS — E538 Deficiency of other specified B group vitamins: Secondary | ICD-10-CM | POA: Diagnosis not present

## 2022-10-05 DIAGNOSIS — N342 Other urethritis: Secondary | ICD-10-CM | POA: Insufficient documentation

## 2022-10-05 LAB — HEPATIC FUNCTION PANEL
ALT: 20 U/L (ref 0–35)
AST: 18 U/L (ref 0–37)
Albumin: 4 g/dL (ref 3.5–5.2)
Alkaline Phosphatase: 107 U/L (ref 39–117)
Bilirubin, Direct: 0.1 mg/dL (ref 0.0–0.3)
Total Bilirubin: 0.5 mg/dL (ref 0.2–1.2)
Total Protein: 7.2 g/dL (ref 6.0–8.3)

## 2022-10-05 LAB — URINALYSIS, ROUTINE W REFLEX MICROSCOPIC
Bilirubin Urine: NEGATIVE
Ketones, ur: NEGATIVE
Leukocytes,Ua: NEGATIVE
Nitrite: NEGATIVE
Specific Gravity, Urine: 1.025 (ref 1.000–1.030)
Total Protein, Urine: NEGATIVE
Urine Glucose: NEGATIVE
Urobilinogen, UA: 0.2 (ref 0.0–1.0)
pH: 6 (ref 5.0–8.0)

## 2022-10-05 LAB — VITAMIN D 25 HYDROXY (VIT D DEFICIENCY, FRACTURES): VITD: 31.79 ng/mL (ref 30.00–100.00)

## 2022-10-05 LAB — BASIC METABOLIC PANEL
BUN: 20 mg/dL (ref 6–23)
CO2: 32 mEq/L (ref 19–32)
Calcium: 9.8 mg/dL (ref 8.4–10.5)
Chloride: 100 mEq/L (ref 96–112)
Creatinine, Ser: 1.07 mg/dL (ref 0.40–1.20)
GFR: 53.99 mL/min — ABNORMAL LOW (ref >60.00)
Glucose, Bld: 86 mg/dL (ref 70–99)
Potassium: 3.9 mEq/L (ref 3.5–5.1)
Sodium: 138 mEq/L (ref 135–145)

## 2022-10-05 LAB — LIPID PANEL
Cholesterol: 227 mg/dL — ABNORMAL HIGH (ref 0–200)
HDL: 48 mg/dL (ref >39.00)
Total CHOL/HDL Ratio: 5
Triglycerides: 530 mg/dL — ABNORMAL HIGH (ref 0.0–149.0)

## 2022-10-05 LAB — MICROALBUMIN / CREATININE URINE RATIO
Creatinine,U: 129.4 mg/dL
Microalb Creat Ratio: 0.6 mg/g (ref 0.0–30.0)
Microalb, Ur: 0.8 mg/dL (ref 0.0–1.9)

## 2022-10-05 LAB — CBC WITH DIFFERENTIAL/PLATELET
Basophils Absolute: 0.1 10*3/uL (ref 0.0–0.1)
Basophils Relative: 0.7 % (ref 0.0–3.0)
Eosinophils Absolute: 0.1 10*3/uL (ref 0.0–0.7)
Eosinophils Relative: 1.8 % (ref 0.0–5.0)
HCT: 43.9 % (ref 36.0–46.0)
Hemoglobin: 15 g/dL (ref 12.0–15.0)
Lymphocytes Relative: 22 % (ref 12.0–46.0)
Lymphs Abs: 1.6 10*3/uL (ref 0.7–4.0)
MCHC: 34.2 g/dL (ref 30.0–36.0)
MCV: 90.1 fl (ref 78.0–100.0)
Monocytes Absolute: 0.5 10*3/uL (ref 0.1–1.0)
Monocytes Relative: 6.7 % (ref 3.0–12.0)
Neutro Abs: 5 10*3/uL (ref 1.4–7.7)
Neutrophils Relative %: 68.8 % (ref 43.0–77.0)
Platelets: 350 10*3/uL (ref 150.0–400.0)
RBC: 4.88 Mil/uL (ref 3.87–5.11)
RDW: 14.3 % (ref 11.5–15.5)
WBC: 7.3 10*3/uL (ref 4.0–10.5)

## 2022-10-05 LAB — LDL CHOLESTEROL, DIRECT: Direct LDL: 143 mg/dL

## 2022-10-05 LAB — HEMOGLOBIN A1C: Hgb A1c MFr Bld: 6 % (ref 4.6–6.5)

## 2022-10-05 LAB — VITAMIN B12: Vitamin B-12: 846 pg/mL (ref 211–911)

## 2022-10-05 LAB — TSH: TSH: 1.83 u[IU]/mL (ref 0.35–5.50)

## 2022-10-05 LAB — SEDIMENTATION RATE: Sed Rate: 21 mm/hr (ref 0–30)

## 2022-10-05 MED ORDER — TRAMADOL HCL 50 MG PO TABS: 50.0000 mg | ORAL_TABLET | Freq: Two times a day (BID) | ORAL | 2 refills | Status: DC | PRN

## 2022-10-05 MED ORDER — NEOMYCIN-POLYMYXIN-HC 3.5-10000-1 OT SOLN: 4.0000 [drp] | Freq: Four times a day (QID) | OTIC | 0 refills | Status: DC

## 2022-10-05 MED ORDER — TOPIRAMATE 50 MG PO TABS: 50.0000 mg | ORAL_TABLET | Freq: Two times a day (BID) | ORAL | 5 refills | Status: DC

## 2022-10-05 NOTE — Progress Notes (Signed)
Patient ID: VENDELA CHRISTIANSEN, female   DOB: [birth year], 67 y.o.   MRN: [id number]        Chief Complaint: follow up left ear pain, chronic lbp, neuropathy, left knee meniscal tear, CTS symptoms, depression, facial rash and ? Dermatomyositis per pt, hld       HPI:  TRICIA USERY is a 67 y.o. female here with multiple concerns; has had 3 days onset left ear pain swelling and sore to touch.  Pt continues to have recurring LBP without change in severity, bowel or bladder change, fever, wt loss,  worsening LE pain/numbness/weakness, gait change or falls, but also has several weeks left knee pain with known meniscal tear, pain now more difficult to bear in last few weeks, Also has bilat mild carpal tunnel syndrome symptoms of numbness and mild pain that have improved to minimal in last few wks.  Has had mild worsening depressive symptoms, but no suicidal ideation, or panic; has ongoing anxiety, Has long hx of depression and many courses of antidepressants of over 20 yrs. Has occasional migraine, asking for topamax.  Also has rash to right face neck and other areas of uncertain etiology over 1 yr, at one point was thought ? Related to possible dermatomyositis with ongoing muscular soreness diffuse.  Does not want statin due to this.  Trying to follow lower chol diet. Conts to smoke, not ready to quit       Wt Readings from Last 3 Encounters:  10/05/22 205 lb (93 kg)  08/18/22 192 lb (87.1 kg)  08/17/22 197 lb (89.4 kg)   BP Readings from Last 3 Encounters:  10/05/22 130/78  08/17/22 123/71  05/26/22 (!) 149/73         Past Medical History:  Diagnosis Date   Arthritis    Asthma    COPD (chronic obstructive pulmonary disease) (HCC)    GERD (gastroesophageal reflux disease)    Hypertension    Pneumonia    Past Surgical History:  Procedure Laterality Date   ABDOMINAL HYSTERECTOMY     APPENDECTOMY     HERNIA REPAIR     Umbilical x 3   TONSILLECTOMY     TRANSFORAMINAL LUMBAR INTERBODY FUSION  (TLIF) WITH PEDICLE SCREW FIXATION 1 LEVEL N/A 05/18/2022   Procedure: Lumbar Four-Five Open Laminectomy/Transforaminal Lumbar Interbody Fusion/Posterolateral fusion;  Surgeon: Judith Part, MD;  Location: [facility];  Service: Neurosurgery;  Laterality: N/A;    reports that she has been smoking cigarettes. She has a 98.00 pack-year smoking history. She has never been exposed to tobacco smoke. She has never used smokeless tobacco. She reports that she does not drink alcohol and does not use drugs. family history includes Allergies in her son and son; Angina in her father; Asthma in her son; Brain cancer in her brother; Cancer in her brother; Kidney disease in her brother and sister; Leukemia in her sister; Parkinson's disease in her father. Allergies  Allergen Reactions   Symbicort [Budesonide-Formoterol Fumarate] Other (See Comments)    Patient reported dizziness, nausea, headaches and sore throat while using Symbicort 160. Reported on 09/03/17   Tramadol Hives   Celebrex [Celecoxib]     Bleeding.     Chocolate Flavor    Ciprofloxacin Nausea And Vomiting    Headache, shaking.    Egg-Derived Products    Flavoring Agent     Unknown   Lisinopril Cough    Pt off med for a week, some improvement   Lyrica [Pregabalin]     "Made  me out of it."    Penicillins     Patient preference   Wellbutrin [Bupropion]    Codeine Palpitations   Current Outpatient Medications on File Prior to Visit  Medication Sig Dispense Refill   acetaminophen (TYLENOL) 650 MG CR tablet Take 1,300 mg by mouth in the morning and at bedtime.     acidophilus (RISAQUAD) CAPS capsule Take 1 capsule by mouth in the morning.     albuterol (PROVENTIL) (2.5 MG/3ML) 0.083% nebulizer solution USE 1 VIAL IN NEBULIZER EVERY 6HRS FOR SHORTNESS OF BREATH/WHEEZING 300 mL 5   albuterol (VENTOLIN HFA) 108 (90 Base) MCG/ACT inhaler INHALE 2 PUFFS BY MOUTH EVERY 6 HOURS AS NEEDED FOR WHEEZING/SHORTNESS OF BREATH 18 each 11    Calcium-Vitamins C & D (CALCIUM/C/D PO) Take 1 tablet by mouth in the morning.     cetirizine (ZYRTEC) 10 MG tablet Take 10 mg by mouth in the morning.     Cholecalciferol (HM VITAMIN D3 PO) Take 5,000 Units by mouth in the morning.     COLLAGEN PO Take by mouth. Collagen Peptides     hydrochlorothiazide (HYDRODIURIL) 12.5 MG tablet TAKE 1 TABLET BY MOUTH EVERY DAY 90 tablet 2   losartan (COZAAR) 25 MG tablet TAKE 1 TABLET (25 MG TOTAL) BY MOUTH DAILY. 90 tablet 2   MELATONIN PO Take 5 mg by mouth at bedtime as needed (sleep).     meloxicam (MOBIC) 15 MG tablet Take 0.5 tablets (7.5 mg total) by mouth daily. (Patient taking differently: Take 7.5 mg by mouth daily as needed for pain.) 60 tablet 0   methocarbamol (ROBAXIN) 750 MG tablet TAKE 1 TABLET (750 MG TOTAL) BY MOUTH EVERY 6 (SIX) HOURS AS NEEDED FOR MUSCLE SPASMS (NOT COVERED) 120 tablet 2   mometasone (NASONEX) 50 MCG/ACT nasal spray Place 2 sprays into the nose every evening.     montelukast (SINGULAIR) 10 MG tablet TAKE 1 TABLET BY MOUTH EVERYDAY AT BEDTIME 90 tablet 3   Multiple Vitamins-Minerals (MULTIVITAMIN WITH MINERALS) tablet Take 1 tablet by mouth in the morning.     omeprazole (PRILOSEC) 40 MG capsule TAKE 1 CAPSULE (40 MG TOTAL) BY MOUTH IN THE MORNING AND AT BEDTIME. 180 capsule 3   Turmeric, Curcuma Longa, (TURMERIC ROOT) POWD by miscellaneous route.     No current facility-administered medications on file prior to visit.        ROS:  All others reviewed and negative.  Objective        PE:  BP 130/78   Pulse 89   Temp 98.4 F (36.9 C) (Oral)   Ht 5\' 4"  (1.626 m)   Wt 205 lb (93 kg)   SpO2 94%   BMI 35.19 kg/m                 Constitutional: Pt appears in NAD               HENT: Head: NCAT.                Right Ear: External ear normal.                 Left Ear: External ear normal. But ext canal with 1+ red, tender swelling               Eyes: . Pupils are equal, round, and reactive to light. Conjunctivae and  EOM are normal               Nose: without d/c  or deformity               Neck: Neck supple. Gross normal ROM               Cardiovascular: Normal rate and regular rhythm.                 Pulmonary/Chest: Effort normal and breath sounds without rales or wheezing.                Abd:  Soft, NT, ND, + BS, no organomegaly               Neurological: Pt is alert. At baseline orientation, motor grossly intact, cn 2-12 intact,     Left knee with 1+ effusion.                 Skin: Skin is warm. LE edema - none, several small right facial lesions ucerative nontender               Psychiatric: Pt behavior is normal without agitation , depressed affect  Micro: none  Cardiac tracings I have personally interpreted today:  none  Pertinent Radiological findings (summarize): none   Lab Results  Component Value Date   WBC 7.3 10/05/2022   HGB 15.0 10/05/2022   HCT 43.9 10/05/2022   PLT 350.0 10/05/2022   GLUCOSE 86 10/05/2022   CHOL 227 (H) 10/05/2022   TRIG (H) 10/05/2022    530.0 Triglyceride is over 400; calculations on Lipids are invalid.   HDL 48.00 10/05/2022   LDLDIRECT 143.0 10/05/2022   ALT 20 10/05/2022   AST 18 10/05/2022   NA 138 10/05/2022   K 3.9 10/05/2022   CL 100 10/05/2022   CREATININE 1.07 10/05/2022   BUN 20 10/05/2022   CO2 32 10/05/2022   TSH 1.83 10/05/2022   HGBA1C 6.0 10/05/2022   MICROALBUR 0.8 10/05/2022   Assessment/Plan:  SAVANNAH MORFORD is a 67 y.o. White or Caucasian [1] female with  has a past medical history of Arthritis, Asthma, COPD (chronic obstructive pulmonary disease) ([place]), GERD (gastroesophageal reflux disease), Hypertension, and Pneumonia.  HLD (hyperlipidemia) Lab Results  Component Value Date   CHOL 227 (H) 10/05/2022   HDL 48.00 10/05/2022   LDLDIRECT 143.0 10/05/2022   TRIG (H) 10/05/2022    530.0 Triglyceride is over 400; calculations on Lipids are invalid.   CHOLHDL 5 10/05/2022   Uncontrolled, goal LDL < 100, pt declines statin or  other zetia for now, for lower chol diet   Hyperglycemia Lab Results  Component Value Date   HGBA1C 6.0 10/05/2022   Stable, pt to continue current medical treatment  - diet, wt control   Hypertension BP Readings from Last 3 Encounters:  10/05/22 130/78  08/17/22 123/71  05/26/22 (!) 149/73   Stable, pt to continue medical treatment hct 12.5 qd, losartan 25 mg qd   Vitamin D deficiency Last vitamin D Lab Results  Component Value Date   VD25OH 31.79 10/05/2022   Low, to start oral replacement   External otitis of left ear Mild to mod, for antibx course cortisporin asd,  to f/u any worsening symptoms or concerns  Smoker Pt counseld to quit, pt not ready  Depression Chronic resistant for many years, with intermittent migraine - d/c duloxetine per pt preference, and add topamax 50 bid  Rash Etiology unclear, for refer dermatology, for ESR with labs  Carpal tunnel syndrome, right Bilateral recently now each much improved ? With less hand use ,  cont to follow  Left knee pain With known meniscal tear and effusion, worsening pain - for tramadol prn, and refer Ortho Dr Wynelle Link  Followup: Return in about 6 months (around 04/07/2023).  Cathlean Cower, MD 10/08/2022 6:13 AM [facility]

## 2022-10-05 NOTE — Patient Instructions (Signed)
Ok to stop the duloxetine by taking 30 mg per day for 2 weeks, then stop  Please take all new medication as prescribed - the left ear drop antibiotic, and tramadol  Please take all new medication as prescribed - the topamax - please start with HALF pill at bedtime for 3 nights, then change to 1/2 pill twice per day for 3 days, then take 1/2 pill in am and 1 pill in PM for 3 days, then go to the 1 pill twice per day after that  Please continue all other medications as before, and refills have been done if requested.  Please have the pharmacy call with any other refills you may need.  Please continue your efforts at being more active, low cholesterol diet, and weight control.  You are otherwise up to date with prevention measures today.  Please keep your appointments with your specialists as you may have planned  You will be contacted regarding the referral for: Dr Wynelle Link for orthopedic, and Dermatology  Please go to the LAB at the blood drawing area for the tests to be done  You will be contacted by phone if any changes need to be made immediately.  Otherwise, you will receive a letter about your results with an explanation, but please check with MyChart first.  Please remember to sign up for MyChart if you have not done so, as this will be important to you in the future with finding out test results, communicating by private email, and scheduling acute appointments online when needed.  Please make an Appointment to return in 6 months, or sooner if needed

## 2022-10-06 ENCOUNTER — Other Ambulatory Visit: Payer: Self-pay | Admitting: Internal Medicine

## 2022-10-06 DIAGNOSIS — R3129 Other microscopic hematuria: Secondary | ICD-10-CM

## 2022-10-08 ENCOUNTER — Encounter: Payer: Self-pay | Admitting: Internal Medicine

## 2022-10-08 NOTE — Assessment & Plan Note (Addendum)
Lab Results  Component Value Date   HGBA1C 6.0 10/05/2022   Stable, pt to continue current medical treatment  - diet, wt control

## 2022-10-08 NOTE — Assessment & Plan Note (Addendum)
Etiology unclear, for refer dermatology, for ESR with labs

## 2022-10-08 NOTE — Assessment & Plan Note (Signed)
Lab Results  Component Value Date   CHOL 227 (H) 10/05/2022   HDL 48.00 10/05/2022   LDLDIRECT 143.0 10/05/2022   TRIG (H) 10/05/2022    530.0 Triglyceride is over 400; calculations on Lipids are invalid.   CHOLHDL 5 10/05/2022   Uncontrolled, goal LDL < 100, pt declines statin or other zetia for now, for lower chol diet

## 2022-10-08 NOTE — Assessment & Plan Note (Signed)
Chronic resistant for many years, with intermittent migraine - d/c duloxetine per pt preference, and add topamax 50 bid

## 2022-10-08 NOTE — Assessment & Plan Note (Signed)
Pt counseld to quit, pt not ready ?

## 2022-10-08 NOTE — Assessment & Plan Note (Signed)
Bilateral recently now each much improved ? With less hand use , cont to follow

## 2022-10-08 NOTE — Assessment & Plan Note (Signed)
With known meniscal tear and effusion, worsening pain - for tramadol prn, and refer Ortho Dr Wynelle Link

## 2022-10-08 NOTE — Assessment & Plan Note (Signed)
Mild to mod, for antibx course cortisporin asd,  to f/u any worsening symptoms or concerns

## 2022-10-08 NOTE — Assessment & Plan Note (Signed)
BP Readings from Last 3 Encounters:  10/05/22 130/78  08/17/22 123/71  05/26/22 (!) 149/73   Stable, pt to continue medical treatment hct 12.5 qd, losartan 25 mg qd

## 2022-10-08 NOTE — Assessment & Plan Note (Signed)
Last vitamin D Lab Results  Component Value Date   VD25OH 31.79 10/05/2022   Low, to start oral replacement

## 2022-10-10 ENCOUNTER — Other Ambulatory Visit: Payer: Self-pay | Admitting: Primary Care

## 2022-10-10 ENCOUNTER — Ambulatory Visit (INDEPENDENT_AMBULATORY_CARE_PROVIDER_SITE_OTHER): Payer: Medicare Other | Admitting: Primary Care

## 2022-10-10 ENCOUNTER — Encounter: Payer: Self-pay | Admitting: Primary Care

## 2022-10-10 VITALS — BP 116/70 | HR 76 | Ht 64.0 in | Wt 206.4 lb

## 2022-10-10 DIAGNOSIS — F17210 Nicotine dependence, cigarettes, uncomplicated: Secondary | ICD-10-CM | POA: Diagnosis not present

## 2022-10-10 DIAGNOSIS — F17200 Nicotine dependence, unspecified, uncomplicated: Secondary | ICD-10-CM

## 2022-10-10 DIAGNOSIS — J4489 Other specified chronic obstructive pulmonary disease: Secondary | ICD-10-CM

## 2022-10-10 MED ORDER — VARENICLINE TARTRATE (STARTER) 0.5 MG X 11 & 1 MG X 42 PO TBPK: ORAL_TABLET | ORAL | 0 refills | Status: AC

## 2022-10-10 MED ORDER — IPRATROPIUM-ALBUTEROL 0.5-2.5 (3) MG/3ML IN SOLN: 3.0000 mL | Freq: Four times a day (QID) | RESPIRATORY_TRACT | 3 refills | Status: DC

## 2022-10-10 MED ORDER — VARENICLINE TARTRATE 1 MG PO TABS: 1.0000 mg | ORAL_TABLET | Freq: Two times a day (BID) | ORAL | 0 refills | Status: DC

## 2022-10-10 NOTE — Telephone Encounter (Signed)
Pt. Calling back about ipratropium-albuterol (DUONEB) 0.5-2.5 (3) MG/3ML SOLN  she said the drug store is saying it will cost her $600 copay

## 2022-10-10 NOTE — Patient Instructions (Signed)
Recommendations: - Start ipratropium-albuterol nebulizer 4 times a day  (morning, afternoon, evening and bedtime)  - Take chantix as directed; Start with starter pack then continue with 1mg  twice daily (notify office if you develop mood changes or any cardiac symptoms such as chest pain)  - Work on tapering amount you are smoking and pick quit date by next visit   Follow-up:  3-4 months with Dr. Lamonte Sakai or sooner if needed   Steps to Quit Smoking Smoking tobacco is the leading cause of preventable death. It can affect almost every organ in the body. Smoking puts you and people around you at risk for many serious, long-lasting (chronic) diseases. Quitting smoking can be hard, but it is one of the best things that you can do for your health. It is never too late to quit. Do not give up if you cannot quit the first time. Some people need to try many times to quit. Do your best to stick to your quit plan, and talk with your doctor if you have any questions or concerns. How do I get ready to quit? Pick a date to quit. Set a date within the next 2 weeks to give you time to prepare. Write down the reasons why you are quitting. Keep this list in places where you will see it often. Tell your family, friends, and co-workers that you are quitting. Their support is important. Talk with your doctor about the choices that may help you quit. Find out if your health insurance will pay for these treatments. Know the people, places, things, and activities that make you want to smoke (triggers). Avoid them. What first steps can I take to quit smoking? Throw away all cigarettes at home, at work, and in your car. Throw away the things that you use when you smoke, such as ashtrays and lighters. Clean your car. Empty the ashtray. Clean your home, including curtains and carpets. What can I do to help me quit smoking? Talk with your doctor about taking medicines and seeing a counselor. You are more likely to succeed  when you do both. If you are pregnant or breastfeeding: Talk with your doctor about counseling or other ways to quit smoking. Do not take medicine to help you quit smoking unless your doctor tells you to. Quit right away Quit smoking completely, instead of slowly cutting back on how much you smoke over a period of time. Stopping smoking right away may be more successful than slowly quitting. Go to counseling. In-person is best if this is an option. You are more likely to quit if you go to counseling sessions regularly. Take medicine You may take medicines to help you quit. Some medicines need a prescription, and some you can buy over-the-counter. Some medicines may contain a drug called nicotine to replace the nicotine in cigarettes. Medicines may: Help you stop having the desire to smoke (cravings). Help to stop the problems that come when you stop smoking (withdrawal symptoms). Your doctor may ask you to use: Nicotine patches, gum, or lozenges. Nicotine inhalers or sprays. Non-nicotine medicine that you take by mouth. Find resources Find resources and other ways to help you quit smoking and remain smoke-free after you quit. They include: Online chats with a Social worker. Phone quitlines. Printed Furniture conservator/restorer. Support groups or group counseling. Text messaging programs. Mobile phone apps. Use apps on your mobile phone or tablet that can help you stick to your quit plan. Examples of free services include Quit Guide from the [facility] and  smokefree.gov  What can I do to make it easier to quit?  Talk to your family and friends. Ask them to support and encourage you. Call a phone quitline, such as 1-800-QUIT-NOW, reach out to support groups, or work with a Social worker. Ask people who smoke to not smoke around you. Avoid places that make you want to smoke, such as: Bars. Parties. Smoke-break areas at work. Spend time with people who do not smoke. Lower the stress in your life. Stress can  make you want to smoke. Try these things to lower stress: Getting regular exercise. Doing deep-breathing exercises. Doing yoga. Meditating. What benefits will I see if I quit smoking? Over time, you may have: A better sense of smell and taste. Less coughing and sore throat. A slower heart rate. Lower blood pressure. Clearer skin. Better breathing. Fewer sick days. Summary Quitting smoking can be hard, but it is one of the best things that you can do for your health. Do not give up if you cannot quit the first time. Some people need to try many times to quit. When you decide to quit smoking, make a plan to help you succeed. Quit smoking right away, not slowly over a period of time. When you start quitting, get help and support to keep you smoke-free. This information is not intended to replace advice given to you by your health care provider. Make sure you discuss any questions you have with your health care provider. Document Revised: 06/24/2021 Document Reviewed: 06/24/2021 Elsevier Patient Education  [place].

## 2022-10-10 NOTE — Telephone Encounter (Signed)
Any ICS/LABA inhalers formulary on plan   She was supposed to be on Anoro or bevespi but had deductible she needed to meet before coverage, this was too expensive for her

## 2022-10-10 NOTE — Progress Notes (Addendum)
@Patient  ID: Frances Nelson, female    DOB: 1956/01/03, 66 y.o.   MRN: [id number]  Chief Complaint  Patient presents with   Follow-up    Breathing is good  ACT 13    Referring provider: Biagio Borg, MD  HPI: 67 year old female, current every day smoker. PMH significant for COPD with asthma, allergic rhinitis, hypertension, GERD.   Previous [facility] pulmonary encounter: ROV 04/11/22 --follow-up visit pleasant 67 year old woman, active smoker (1.5 packs/day) with associated COPD as well as chronic cough.  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  Currently managed on Singulair, has albuterol available to use, takes bout 2x a day.  We tried her on Spiriva in the past but was not well covered by her insurance. She able to exert but does get SOB w stairs, longer walks. Some cough in the am, occasionally will wake up at night. Thick mucous, clear, no blood.   10/10/2022- Interim hx  Patient presents today for 6 months visit. She has been doing well recently. Breathing is baseline. She has a chronic productive cough especialy in the morning along with occasional wheezing symptoms. She is not currently not on maintenance inhaler d/t insurance coverage. She has a deductible she has not met and can not afford inhalers. This has been an on going issues for her and been address several times by Dr. Lamonte Sakai. She uses albuterol 1-2 times a day. She takes Singulair daily, zyrtec and nasonex. She is smoking 1 pack per day, she has been on chantix before and would like to re-start   Allergies  Allergen Reactions   Symbicort [Budesonide-Formoterol Fumarate] Other (See Comments)    Patient reported dizziness, nausea, headaches and sore throat while using Symbicort 160. Reported on 09/03/17   Tramadol Hives   Celebrex [Celecoxib]     Bleeding.     Chocolate Flavor    Ciprofloxacin Nausea And Vomiting    Headache, shaking.    Egg-Derived  Products    Flavoring Agent     Unknown   Lisinopril Cough    Pt off med for a week, some improvement   Lyrica [Pregabalin]     "Made me out of it."    Penicillins     Patient preference   Wellbutrin [Bupropion]    Codeine Palpitations    Immunization History  Administered Date(s) Administered   PFIZER(Purple Top)SARS-COV-2 Vaccination 11/06/2019, 12/01/2019   Tdap 03/15/2018    Past Medical History:  Diagnosis Date   Arthritis    Asthma    COPD (chronic obstructive pulmonary disease) (HCC)    GERD (gastroesophageal reflux disease)    Hypertension    Pneumonia     Tobacco History: Social History   Tobacco Use  Smoking Status Every Day   Packs/day: 2.00   Years: 49.00   Additional pack years: 0.00   Total pack years: 98.00   Types: Cigarettes   Passive exposure: Never  Smokeless Tobacco Never   Ready to quit: Not Answered Counseling given: Not Answered   Outpatient Medications Prior to Visit  Medication Sig Dispense Refill   acetaminophen (TYLENOL) 650 MG CR tablet Take 1,300 mg by mouth in the morning and at bedtime.     acidophilus (RISAQUAD) CAPS capsule Take 1 capsule by mouth in the morning.     Calcium-Vitamins C & D (CALCIUM/C/D PO) Take 1 tablet by mouth in the morning.     cetirizine (ZYRTEC) 10 MG  tablet Take 10 mg by mouth in the morning.     Cholecalciferol (HM VITAMIN D3 PO) Take 5,000 Units by mouth in the morning.     COLLAGEN PO Take by mouth. Collagen Peptides     hydrochlorothiazide (HYDRODIURIL) 12.5 MG tablet TAKE 1 TABLET BY MOUTH EVERY DAY 90 tablet 2   losartan (COZAAR) 25 MG tablet TAKE 1 TABLET (25 MG TOTAL) BY MOUTH DAILY. 90 tablet 2   MELATONIN PO Take 5 mg by mouth at bedtime as needed (sleep).     meloxicam (MOBIC) 15 MG tablet Take 0.5 tablets (7.5 mg total) by mouth daily. (Patient taking differently: Take 7.5 mg by mouth daily as needed for pain.) 60 tablet 0   methocarbamol (ROBAXIN) 750 MG tablet TAKE 1 TABLET (750 MG TOTAL)  BY MOUTH EVERY 6 (SIX) HOURS AS NEEDED FOR MUSCLE SPASMS (NOT COVERED) 120 tablet 2   mometasone (NASONEX) 50 MCG/ACT nasal spray Place 2 sprays into the nose every evening.     montelukast (SINGULAIR) 10 MG tablet TAKE 1 TABLET BY MOUTH EVERYDAY AT BEDTIME 90 tablet 3   Multiple Vitamins-Minerals (MULTIVITAMIN WITH MINERALS) tablet Take 1 tablet by mouth in the morning.     neomycin-polymyxin-hydrocortisone (CORTISPORIN) OTIC solution Place 4 drops into the left ear 4 (four) times daily. 10 mL 0   omeprazole (PRILOSEC) 40 MG capsule TAKE 1 CAPSULE (40 MG TOTAL) BY MOUTH IN THE MORNING AND AT BEDTIME. 180 capsule 3   topiramate (TOPAMAX) 50 MG tablet Take 1 tablet (50 mg total) by mouth 2 (two) times daily. 120 tablet 5   traMADol (ULTRAM) 50 MG tablet Take 1 tablet (50 mg total) by mouth every 12 (twelve) hours as needed. 90 tablet 2   Turmeric, Curcuma [place], (TURMERIC ROOT) POWD by miscellaneous route.     albuterol (PROVENTIL) (2.5 MG/3ML) 0.083% nebulizer solution USE 1 VIAL IN NEBULIZER EVERY 6HRS FOR SHORTNESS OF BREATH/WHEEZING 300 mL 5   albuterol (VENTOLIN HFA) 108 (90 Base) MCG/ACT inhaler INHALE 2 PUFFS BY MOUTH EVERY 6 HOURS AS NEEDED FOR WHEEZING/SHORTNESS OF BREATH 18 each 11   No facility-administered medications prior to visit.    Review of Systems  Review of Systems  Constitutional: Negative.   HENT: Negative.    Respiratory:  Positive for cough and wheezing. Negative for shortness of breath.   Cardiovascular: Negative.    Physical Exam  BP 116/70 (BP Location: Left Arm, Patient Position: Sitting, Cuff Size: Normal)   Pulse 76   Ht 5\' 4"  (1.626 m)   Wt 206 lb 6.4 oz (93.6 kg)   SpO2 97%   BMI 35.43 kg/m  Physical Exam Constitutional:      Appearance: Normal appearance.  HENT:     Head: Normocephalic and atraumatic.  Cardiovascular:     Rate and Rhythm: Normal rate.  Pulmonary:     Effort: Pulmonary effort is normal.  Neurological:     General: No focal  deficit present.     Mental Status: She is alert and oriented to person, place, and time. Mental status is at baseline.  Psychiatric:        Mood and Affect: Mood normal.        Behavior: Behavior normal.        Thought Content: Thought content normal.        Judgment: Judgment normal.      Lab Results:  CBC    Component Value Date/Time   WBC 7.3 10/05/2022 1221   RBC 4.88 10/05/2022  1221   HGB 15.0 10/05/2022 1221   HCT 43.9 10/05/2022 1221   PLT 350.0 10/05/2022 1221   MCV 90.1 10/05/2022 1221   MCH 30.8 05/11/2022 1456   MCHC 34.2 10/05/2022 1221   RDW 14.3 10/05/2022 1221   LYMPHSABS 1.6 10/05/2022 1221   MONOABS 0.5 10/05/2022 1221   EOSABS 0.1 10/05/2022 1221   BASOSABS 0.1 10/05/2022 1221    BMET    Component Value Date/Time   NA 138 10/05/2022 1221   K 3.9 10/05/2022 1221   CL 100 10/05/2022 1221   CO2 32 10/05/2022 1221   GLUCOSE 86 10/05/2022 1221   BUN 20 10/05/2022 1221   CREATININE 1.07 10/05/2022 1221   CREATININE 0.79 07/04/2018 1119   CALCIUM 9.8 10/05/2022 1221   GFRNONAA >60 05/11/2022 1456   GFRNONAA >60 07/04/2018 1119   GFRAA >60 07/04/2018 1119    BNP No results found for: "BNP"  ProBNP    Component Value Date/Time   PROBNP 20.0 10/16/2019 1116    Imaging: No results found.   Assessment & Plan:   COPD with asthma ([place]) - Stable; No recent exacerbations or prednisone use.  She uses [place] 1-2 times a day.  Not currently on maintenance inhaler due to insurance coverage. Start ipratropium-albuterol nebulizer 4 times a day. Smoking cessation strongly encouraged. FU 3-4 months with Dr. Lamonte Sakai or sooner.   Smoker - Patient expressed interest in quitting, she has been on Chantix before in the past with positive benefit.  We have sent in starter pack advised her to notify office if she develops any mood changes or cardiac symptoms such as chest pain.  Encourage she taper amount she is smoking and pick quit date by next visit.   Martyn Ehrich, NP 10/16/2022

## 2022-10-11 NOTE — Progress Notes (Signed)
NEUROLOGY FOLLOW UP OFFICE NOTE  Frances Nelson JX:[id number]  Subjective:  Frances Nelson is a 67 y.o. year old right-handed female with a medical history of chronic pain, Baker's cyst of left knee, HTN, HLD, vit D deficiency, COPD (smoker), OA, degenerative disease and spinal stenosis of cervical and lumbar spine, depression who we last saw on 08/17/22.  To briefly review: Patient's symptoms started with left achilles tendonitis around 2018. She started getting numbness and tingling in both feet around 2019, like they were falling asleep. It has been constant. The symptoms have progressed over the years so that the numbness and tingling are up to the mid calf in the left leg and to lower leg on right. Symptoms have improved some on the right, but always been worse on the left. She endorses chronic back pain that predates symptoms in feet as well. She endorses occasional sciatica. She sometimes feels like the radiation is coming from the big toe up her leg, like someone is sticking her with a needle in her feet.   Sports medicine saw patient and felt symptoms were related to lumbar stenosis, so she was referred to NSGY who did L4-5 open decompression and PSIF on 05/18/22. Patient continues to have pain in back and hips. She is not really having shooting pain going down. The numbness and tingling in her legs has not significantly changed. It wakes her up at night. She endorses leg cramps. She denies bowel or bladder changes and denies saddle anesthesia.   Patient is currently taking Cymbalta 60 mg qhs, which she is unsure if it helps. She has been on it for over a year. She has previously tried gabapentin, but stopped this as she did not think it was helping. She has tried Lyrica but had a bad reaction (cannot remember what the reaction was). She has tried nortriptyline in the past as well. She has tried diclofenac cream, lidocaine cream, biofreeze, and bengay type of medications. She has had a little  success with CBD cream. She also takes Tylenol arthritis.   Patient was referred to PT. She has not yet scheduled this (as of 08/17/22).   Patient also has a lot of issues with her left knee including a Baker's cyst and torn meniscus. She sees sports medicine for this. She is using a cane to walk due to this pain.   Patient has previously seen neurology in the past at [facility]. Patient had a 4 limb EMG done. She was told she had carpal tunnel syndrome, which was odd to her as she did not have symptoms in her hands. She was not sure why they even checked her arms. She does mention that since her back surgery in 05/2022, she has noticed some cramping/locking up of fingers.   Patient saw Dr. Estanislado Pandy in rheumatology on 03/22/22. Per clinic note: Positive ANA (antinuclear antibody) - 10/03/21: ANA 1:40NH, RF<14, ESR 20, Uric acid 7.5, anti-CCP<16, CRP 1.2 -there is no history of oral ulcers, nasal ulcers, malar rash, photosensitivity, Raynaud's phenomenon or lymphadenopathy.  She has dry mouth and dry eyes symptoms most likely related to smoking.  Over-the-counter products were discussed.  I will obtain following labs today.  Plan: Protein / creatinine ratio, urine, ANA, Anti-DNA antibody, double-stranded, C3 and C4, Sjogrens syndrome-A extractable nuclear antibody, Sjogrens syndrome-B extractable nuclear antibody, Anti-Smith antibody, RNP Antibody    She report any constitutional symptoms like fever, night sweats, anorexia or unintentional weight loss.   EtOH use: None  Restrictive diet?  No Family history of neuropathy/myopathy/neurologic disease? Not definitively, but 2 sisters has problems with left leg  Most recent Assessment and Plan (08/17/22): Her neurological examination is pertinent for diminished sensation to pinprick, vibration, and proprioception in bilateral lower extremities in a length dependent fashion. The etiology of patient's symptoms is currently unclear but likely multifactorial with  possible contributions from lumbosacral radiculopathy, OA/MSK problems, peripheral neuropathy, and/or a chronic pain syndrome. Her only known risk factor for PN is pre-diabetes. I will send labs for other treatable causes and get a repeat EMG to further characterize her symptoms as EMG in 2021 of LLE was normal per report.   PLAN: -Blood work: B1, B12, IFE -EMG: PN (L > R) -Increase Cymbalta to 30 mg in the morning and 60 mg at night. Can increase to 60 mg BID if needed.  Since their last visit: Labs were significant for a borderline low B12 (300). I recommended supplementation with 1000 mcg daily. Patient is taking this.  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   Overall, patient's symptoms have not significantly changed. She continues to have numbness and tingling in legs and back pain with shooting pains. She mentions she is now getting a vibration sensation in her right arm after using her electric toothbrush (like it is still in her hand). It is constant, but a different sensation from the numbness and tingling in her legs. This gets worse is she is using her hands. She is not currently using wrist splints for CTS.  Patient is taking Cymbalta 90 mg at night. This helped her sleep better. The split dose did not help.  Patient has not heard from physical therapy.  Of note, patient was started on Topamax for migraines, which seems to be helping per patient.  Patient is seeing sports medicine for a torn meniscus in the left knee. Surgery has been recommended, but patient has not seen a surgeon.   MEDICATIONS:  Outpatient Encounter Medications as of 10/18/2022  Medication Sig   acetaminophen (TYLENOL) 650 MG CR tablet Take 1,300 mg by mouth in the morning and at bedtime.   acidophilus (RISAQUAD) CAPS capsule Take 1 capsule by mouth in the morning.    Calcium-Vitamins C & D (CALCIUM/C/D PO) Take 1 tablet by mouth in the morning.   cetirizine (ZYRTEC) 10 MG tablet Take 10 mg by mouth in the morning.   Cholecalciferol (HM VITAMIN D3 PO) Take 5,000 Units by mouth in the morning.   COLLAGEN PO Take by mouth. Collagen Peptides   hydrochlorothiazide (HYDRODIURIL) 12.5 MG tablet TAKE 1 TABLET BY MOUTH EVERY DAY   ipratropium-albuterol (DUONEB) 0.5-2.5 (3) MG/3ML SOLN Take 3 mLs by nebulization in the morning, at noon, in the evening, and at bedtime.   losartan (COZAAR) 25 MG tablet TAKE 1 TABLET (25 MG TOTAL) BY MOUTH DAILY.   MELATONIN PO Take 5 mg by mouth at bedtime as needed (sleep).   meloxicam (MOBIC) 15 MG tablet Take 0.5 tablets (7.5 mg total) by mouth daily. (Patient taking differently: Take 7.5 mg by mouth daily as needed for pain.)   methocarbamol (ROBAXIN) 750 MG tablet TAKE 1 TABLET (750 MG TOTAL) BY MOUTH EVERY 6 (SIX) HOURS AS NEEDED FOR MUSCLE SPASMS (NOT COVERED)   mometasone (NASONEX) 50 MCG/ACT nasal spray Place 2 sprays into the nose every evening.   montelukast (SINGULAIR) 10 MG tablet TAKE 1 TABLET BY  MOUTH EVERYDAY AT BEDTIME   Multiple Vitamins-Minerals (MULTIVITAMIN WITH MINERALS) tablet Take 1 tablet by mouth in the morning.   neomycin-polymyxin-hydrocortisone (CORTISPORIN) OTIC solution Place 4 drops into the left ear 4 (four) times daily.   omeprazole (PRILOSEC) 40 MG capsule TAKE 1 CAPSULE (40 MG TOTAL) BY MOUTH IN THE MORNING AND AT BEDTIME.   topiramate (TOPAMAX) 50 MG tablet Take 1 tablet (50 mg total) by mouth 2 (two) times daily.   traMADol (ULTRAM) 50 MG tablet Take 1 tablet (50 mg total) by mouth every 12 (twelve) hours as needed.   Turmeric, Curcuma Longa, (TURMERIC ROOT) POWD by miscellaneous route.   [START ON 11/09/2022] varenicline (CHANTIX CONTINUING MONTH PAK) 1 MG tablet Take 1 tablet (1 mg total) by mouth 2 (two) times daily.   Varenicline Tartrate, Starter, (CHANTIX STARTING MONTH PAK) 0.5 MG X 11 & 1 MG X  42 TBPK Follow pack directions   No facility-administered encounter medications on file as of 10/18/2022.    PAST MEDICAL HISTORY: Past Medical History:  Diagnosis Date   Arthritis    Asthma    COPD (chronic obstructive pulmonary disease)    GERD (gastroesophageal reflux disease)    Hypertension    Pneumonia     PAST SURGICAL HISTORY: Past Surgical History:  Procedure Laterality Date   ABDOMINAL HYSTERECTOMY     APPENDECTOMY     HERNIA REPAIR     Umbilical x 3   TONSILLECTOMY     TRANSFORAMINAL LUMBAR INTERBODY FUSION (TLIF) WITH PEDICLE SCREW FIXATION 1 LEVEL N/A 05/18/2022   Procedure: Lumbar Four-Five Open Laminectomy/Transforaminal Lumbar Interbody Fusion/Posterolateral fusion;  Surgeon: Judith Part, MD;  Location: [facility];  Service: Neurosurgery;  Laterality: N/A;    ALLERGIES: Allergies  Allergen Reactions   Symbicort [Budesonide-Formoterol Fumarate] Other (See Comments)    Patient reported dizziness, nausea, headaches and sore throat while using Symbicort 160. Reported on 09/03/17   Tramadol Hives   Celebrex [Celecoxib]     Bleeding.     Chocolate Flavor    Ciprofloxacin Nausea And Vomiting    Headache, shaking.    Egg-Derived Products    Flavoring Agent     Unknown   Lisinopril Cough    Pt off med for a week, some improvement   Lyrica [Pregabalin]     "Made me out of it."    Penicillins     Patient preference   Wellbutrin [Bupropion]    Codeine Palpitations    FAMILY HISTORY: Family History  Problem Relation Age of Onset   Angina Father    Parkinson's disease Father    Leukemia Sister    Kidney disease Sister    Kidney disease Brother    Brain cancer Brother        glioblastoma    Cancer Brother    Allergies Son    Asthma Son    Allergies Son     SOCIAL HISTORY: Social History   Tobacco Use   Smoking status: Every Day    Packs/day: 2.00    Years: 49.00    Additional pack years: 0.00    Total pack years: 98.00    Types: Cigarettes     Passive exposure: Never   Smokeless tobacco: Never  Vaping Use   Vaping Use: Never used  Substance Use Topics   Alcohol use: No   Drug use: Never   Social History   Social History Narrative   Right Handed    Lives in a two story home - Lives  with son       Are you currently employed ?    What is your current occupation?retired   Do you live at home alone?   Who lives with you?    What type of home do you live in: 1 story or 2 story?           Objective:  Vital Signs:  BP 131/84   Pulse 80   Ht 5\' 4"  (1.626 m)   Wt 208 lb (94.3 kg)   SpO2 96%   BMI 35.70 kg/m   General: General appearance: Awake and alert. No distress. Cooperative with exam.  Skin: No obvious rash or jaundice. HEENT: Atraumatic. Anicteric. Lungs: Non-labored breathing on room air   Neurological: Mental Status: Alert. Speech fluent. No pseudobulbar affect Cranial Nerves: CNII: No RAPD. Visual fields intact. CNIII, IV, VI: PERRL. No nystagmus. EOMI. CN V: Facial sensation intact bilaterally to fine touch. CN VII: Facial muscles symmetric and strong. No ptosis at rest. CN VIII: Hears finger rub well bilaterally. CN IX: No hypophonia. CN X: Palate elevates symmetrically. CN XI: Full strength shoulder shrug bilaterally. CN XII: Tongue protrusion full and midline. No atrophy or fasciculations. No significant dysarthria Motor: Tone is normal.  Individual muscle group testing (MRC grade out of 5):  Movement     Neck flexion 5    Neck extension 5     Right Left   Shoulder abduction 5 5   Elbow flexion 5 5   Elbow extension 5 5   Wrist extension 5 5   Wrist flexion 5 5   Finger abduction - FDI 5 5   Finger abduction - ADM 5 5   Finger extension 5 5   Finger distal flexion - 2/3 5 5    Finger distal flexion - 4/5 5 5    Thumb flexion - FPL 5 5   Thumb abduction - APB 5- 5-    Hip flexion 5 5- Left limited by pain, especially in knee  Hip extension 5 5-   Hip adduction 5 5-   Hip  abduction 5 5-   Knee extension 5 5-   Knee flexion 5 5-   Dorsiflexion 5 5-   Plantarflexion 5 5-     Reflexes:  Right Left   Bicep 2+ 2+   Tricep 2+ 2+   BrRad 2+ 2+   Knee 2+ 2+   Ankle 2+ 2+    Sensation: Pinprick: Absent in bilateral feet to calf on right and thigh on left Coordination: Intact finger-to- nose-finger bilaterally.  Gait: Antalgic gait. Pain in left lower back and left knee.   Labs and Imaging review: New results: 08/17/22: IFE: no M protein B12: 300 B1: 10  B12 (10/05/22): 846 HbA1c (10/05/22): 6.0 TSH (10/05/22): 1.83  EMG (09/05/22): NCV & EMG Findings: Extensive electrodiagnostic evaluation of the left lower limb with additional nerve conduction studies of the right lower limb shows: Bilateral sural and superficial peroneal/fibular sensory responses are absent. Left median sensory response shows prolonged distal peak latency (4.3 ms). Left ulnar sensory response is within normal limits. Left tibial (AH) motor response shows prolonged distal onset latency (6.4 ms) and reduced amplitude (1.61 mV). Left peroneal/fibular (EDB), left median (APB), and left ulnar (ADM) motor responses are within normal limits.  Left H reflex latency is within normal limits. Chronic motor axon loss changes WITH active denervation changes are seen in the left extensor digitorum brevis and abductor hallucis muscles. Chronic motor axon loss changes WITHOUT active  denervation changes are seen in the left tibialis anterior, flexor digitorum longus, and gluteus medius muscles.   Impression: This is an abnormal study. 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 Evidence of a left median mononeuropathy at or distal to the wrist,  consistent with carpal tunnel syndrome, mild in degree electrically.  Previously reviewed results: BMP and CBC (05/11/22): unremarkable HbA1c (04/06/22): 5.5 (6.2 is highest in the past) ANA (03/22/22): positive at 1:40 RNP ab (03/22/22): 1.7 (positive) Normal or unremarkable (03/22/22): anti-Sm, SSA, SSB, C3, C4, dsDNA, CCP (10/03/21)   Imaging: Left knee MRI (07/04/22): IMPRESSION: 1. Complex degenerative tear and diminution of the posterior horn of the medial meniscus. 2. Mild osteoarthritis of the medial tibiofemoral compartment.   Lumbar xray (05/18/22): FINDINGS: Again noted are a slight levorotary lumbar scoliosis apex L2, minimal grade 1 L4-5 spondylolisthesis unchanged as well.   Bilateral posterior fusion rods and pedicle screws, laminectomy defect and interbody metallic disc space hardware are again noted at L4-5.   No acute hardware complication or hardware failure are seen.   Osteopenia. There is preservation of the normal lumbar vertebral heights without evidence of fractures.   The lumbar discs are maintained in heights. There is spondylosis. Mild facet hypertrophy at L2-3 inferiorly.   The SI joints are unremarkable, as visualized.   The aorta and common iliac arteries are heavily calcified.   IMPRESSION: 1. No evidence of fractures. 2. Osteopenia and degenerative change. 3. Slight levorotary lumbar scoliosis apex L2, minimal grade 1 L4-5 spondylolisthesis unchanged. 4. L4-5 dorsal fusion construct with interbody hardware and laminectomy. 5. Aortic atherosclerosis.   MRI lumbar spine (10/25/21): FINDINGS: Segmentation:  5 lumbar vertebra.   Alignment: Mild retrolisthesis L2-3 and L3-4. 4 mm anterolisthesis L4-5   Vertebrae:  Normal bone marrow.  Negative for fracture or mass   Conus medullaris and cauda equina: Conus extends to the T12-L1 level. Conus and cauda equina appear normal.   Paraspinal and other soft tissues: Negative for paraspinous mass  or adenopathy   Disc levels:   L1-2: Negative   L2-3: Mild disc degeneration and disc bulging. No significant spinal or foraminal stenosis   L3-4: Mild disc and mild facet degeneration. Negative for disc protrusion or stenosis.   L4-5: 4 mm anterolisthesis. Diffuse disc bulging and moderate to advanced facet degeneration. Moderate spinal stenosis. Moderate subarticular stenosis bilaterally. Right foraminal encroachment with right L4 and L5 nerve root impingement.   L5-S1: Negative   IMPRESSION: 4 mm anterolisthesis L4-5. Moderate spinal stenosis. Moderate subarticular and foraminal stenosis on the right. Moderate left subarticular stenosis.   Cervical spine xray (04/09/20): FINDINGS: There is no evidence of cervical spine fracture or prevertebral soft tissue swelling. Alignment is normal. No other significant bone abnormalities are identified.   IMPRESSION: Negative cervical spine radiographs.   EMG (06/03/20 by Dr. Letta Pate in PM&R): Normal study of LLE - no neuropathy or radiculopathy  Assessment/Plan:  This is Frances Nelson, a 67 y.o. female with numbness and tingling in the legs and arms. 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 Her symptoms are likely multifactorial with contributions from the above and left knee and back pain. She has been recommended to get left knee surgery,  and is hopefully going to be getting this soon per patient. Unfortunately, patient has failed many prior neuropathic medications. We discussed EMG of RUE, but patient preferred to try wrist splints for CTS first.  Plan: -Continue Cymbalta 90 mg at bedtime -Will check on PT referral. Patient will talk to sports medicine about whether PT is a good idea currently with that knee. -Recommended bilateral wrist splints for carpal tunnel  Return to clinic in 6 months  Total time spent reviewing records, interview,  history/exam, documentation, and coordination of care on day of encounter:  77 min  Kai Levins, MD

## 2022-10-16 NOTE — Assessment & Plan Note (Signed)
-   Stable; No recent exacerbations or prednisone use.  She uses [place] 1-2 times a day.  Not currently on maintenance inhaler due to insurance coverage. Start ipratropium-albuterol nebulizer 4 times a day. Smoking cessation strongly encouraged. FU 3-4 months with Dr. Lamonte Sakai or sooner.

## 2022-10-16 NOTE — Assessment & Plan Note (Signed)
-   Patient expressed interest in quitting, she has been on Chantix before in the past with positive benefit.  We have sent in starter pack advised her to notify office if she develops any mood changes or cardiac symptoms such as chest pain.  Encourage she taper amount she is smoking and pick quit date by next visit.

## 2022-10-18 ENCOUNTER — Encounter: Payer: Self-pay | Admitting: Neurology

## 2022-10-18 ENCOUNTER — Ambulatory Visit (INDEPENDENT_AMBULATORY_CARE_PROVIDER_SITE_OTHER): Payer: Medicare Other | Admitting: Neurology

## 2022-10-18 VITALS — BP 131/84 | HR 80 | Ht 64.0 in | Wt 208.0 lb

## 2022-10-18 DIAGNOSIS — M25551 Pain in right hip: Secondary | ICD-10-CM

## 2022-10-18 DIAGNOSIS — R202 Paresthesia of skin: Secondary | ICD-10-CM

## 2022-10-18 DIAGNOSIS — M5416 Radiculopathy, lumbar region: Secondary | ICD-10-CM | POA: Diagnosis not present

## 2022-10-18 DIAGNOSIS — M25562 Pain in left knee: Secondary | ICD-10-CM

## 2022-10-18 DIAGNOSIS — M5442 Lumbago with sciatica, left side: Secondary | ICD-10-CM

## 2022-10-18 DIAGNOSIS — G8929 Other chronic pain: Secondary | ICD-10-CM

## 2022-10-18 DIAGNOSIS — G629 Polyneuropathy, unspecified: Secondary | ICD-10-CM

## 2022-10-18 DIAGNOSIS — M5441 Lumbago with sciatica, right side: Secondary | ICD-10-CM

## 2022-10-18 DIAGNOSIS — R200 Anesthesia of skin: Secondary | ICD-10-CM | POA: Diagnosis not present

## 2022-10-18 DIAGNOSIS — M25552 Pain in left hip: Secondary | ICD-10-CM

## 2022-10-18 NOTE — Progress Notes (Unsigned)
Frances Nelson [name] Phone: [phone]   Assessment and Plan:     There are no diagnoses linked to this encounter.  ***   Pertinent previous records reviewed include ***   Follow Up: ***     Subjective:   I, Tadeo Besecker, am serving as a Education administrator for Doctor Glennon Mac   Chief Complaint: left sided pain    HPI:  10/03/2021 Patient is a 67 year old female complaining of left ankle, left leg, and low back pain. Patient states that her left sided pain started 2015 hasnt been getting any better , Achilles tendonitis bilateral left is more painful, left knee pain , fell down the stairs feb 7th and caught herself her legs were underneath her in a dive position her knee had given out on her. Has picture of her ankle from her fall, low back pain is all the way across pain is mostly in the middle , has numbness and tingling her toes are always numb gets weird sensations , feels like ice cold water is dripping down the back of her leg, gets really sharp pains on her legs and feet, was taking ib for the swelling but has had reactions with it nausea, seems to work in reverse the ib makes the swelling worst, takes tylenol arthritis 650 mg that seems to help a little, when she takes she is able to sleep at night. Pt says she has a lump on her left heel and left knee and a cyst in the back of her knee   10/24/2021 Patient states that today her legs are killing her, had improvement until it got cold again and then the pain came back    10/28/2021 Patient states that she still hs pain in her legs and feet, toes are still numb, beginning of march she fell down the stairs and her left foot 3 great toe down are still bruised look like blood blisters , still getting sharp pains    11/17/2021 Patient states had epidural and some of the swelling is gone and the numbness in legs are gone but the pain is now more intense. Pain  in the shoulders radiating down in both arms to the elbows. These pains have started only since the epidural. Also has had a headache and can get more intense. Sometimes also will get sporadic pain in foot that causes a reflex reaction.   12/09/2021 Patient states that she is walking without her cane the pain is better,  feet and ankle still bother her thinks it more from arthritis,  isn't having the leg pain, was able to lay on the ground for the first time in 6 years, was able to raise single leg got pain when she tried to raise both legs    04/12/2022 Patient states that  she still has left sided pain , also bilateral feet , toes and up to mid calf is numb and hurt 24/7, knee she has a lump that is hard and then she has a baker cyst on the back of her leg    04/25/2022 Patient states she has a pain in the [place] knows it coming from the back today its acting up   05/04/2022 Patient states ready for HA injection , surgeon never got back to her about the injection    06/27/2022 Patient states not doing real great had back surgery and was bleeding for 23 days and still oozing, will  be going to neurology and PT , is in pain     07/07/2022 Patient states that she is the same    07/20/2022 Patient presents today for procedure only visit zilretta injection to left knee.   08/18/2022 Patient states that the shot did help but she is having some pain not as constant , went to the neurologist and they are going to do an EMG   10/19/2022 Patient states   Relevant Historical Information: Positive ANA in 2019, but no recent follow-up with rheumatology, chronic pain in multiple locations, hypertension, COPD  Additional pertinent review of systems negative.   Current Outpatient Medications:    acetaminophen (TYLENOL) 650 MG CR tablet, Take 1,300 mg by mouth in the morning and at bedtime., Disp: , Rfl:    acidophilus (RISAQUAD) CAPS capsule, Take 1 capsule by mouth in the morning., Disp: , Rfl:     Calcium-Vitamins C & D (CALCIUM/C/D PO), Take 1 tablet by mouth in the morning., Disp: , Rfl:    cetirizine (ZYRTEC) 10 MG tablet, Take 10 mg by mouth in the morning., Disp: , Rfl:    Cholecalciferol (HM VITAMIN D3 PO), Take 5,000 Units by mouth in the morning., Disp: , Rfl:    COLLAGEN PO, Take by mouth. Collagen Peptides, Disp: , Rfl:    hydrochlorothiazide (HYDRODIURIL) 12.5 MG tablet, TAKE 1 TABLET BY MOUTH EVERY DAY, Disp: 90 tablet, Rfl: 2   ipratropium-albuterol (DUONEB) 0.5-2.5 (3) MG/3ML SOLN, Take 3 mLs by nebulization in the morning, at noon, in the evening, and at bedtime., Disp: 360 mL, Rfl: 3   losartan (COZAAR) 25 MG tablet, TAKE 1 TABLET (25 MG TOTAL) BY MOUTH DAILY., Disp: 90 tablet, Rfl: 2   MELATONIN PO, Take 5 mg by mouth at bedtime as needed (sleep)., Disp: , Rfl:    meloxicam (MOBIC) 15 MG tablet, Take 0.5 tablets (7.5 mg total) by mouth daily. (Patient taking differently: Take 7.5 mg by mouth daily as needed for pain.), Disp: 60 tablet, Rfl: 0   methocarbamol (ROBAXIN) 750 MG tablet, TAKE 1 TABLET (750 MG TOTAL) BY MOUTH EVERY 6 (SIX) HOURS AS NEEDED FOR MUSCLE SPASMS (NOT COVERED), Disp: 120 tablet, Rfl: 2   mometasone (NASONEX) 50 MCG/ACT nasal spray, Place 2 sprays into the nose every evening., Disp: , Rfl:    montelukast (SINGULAIR) 10 MG tablet, TAKE 1 TABLET BY MOUTH EVERYDAY AT BEDTIME, Disp: 90 tablet, Rfl: 3   Multiple Vitamins-Minerals (MULTIVITAMIN WITH MINERALS) tablet, Take 1 tablet by mouth in the morning., Disp: , Rfl:    neomycin-polymyxin-hydrocortisone (CORTISPORIN) OTIC solution, Place 4 drops into the left ear 4 (four) times daily., Disp: 10 mL, Rfl: 0   omeprazole (PRILOSEC) 40 MG capsule, TAKE 1 CAPSULE (40 MG TOTAL) BY MOUTH IN THE MORNING AND AT BEDTIME., Disp: 180 capsule, Rfl: 3   topiramate (TOPAMAX) 50 MG tablet, Take 1 tablet (50 mg total) by mouth 2 (two) times daily., Disp: 120 tablet, Rfl: 5   traMADol (ULTRAM) 50 MG tablet, Take 1 tablet (50 mg  total) by mouth every 12 (twelve) hours as needed., Disp: 90 tablet, Rfl: 2   Turmeric, Curcuma [place], (TURMERIC ROOT) POWD, by miscellaneous route., Disp: , Rfl:    [START ON 11/09/2022] varenicline (CHANTIX CONTINUING MONTH PAK) 1 MG tablet, Take 1 tablet (1 mg total) by mouth 2 (two) times daily., Disp: 60 tablet, Rfl: 0   Varenicline Tartrate, Starter, (CHANTIX STARTING MONTH PAK) 0.5 MG X 11 & 1 MG X 42 TBPK, Follow pack  directions, Disp: 1 each, Rfl: 0   Objective:     There were no vitals filed for this visit.    There is no height or weight on file to calculate BMI.    Physical Exam:    ***   Electronically signed by:  Frances Nelson D.[facility] 7:19 AM 10/18/22

## 2022-10-18 NOTE — Patient Instructions (Addendum)
Continue Cymbalta 90 mg at bedtime for your symptoms.  Talk with your sports medicine doctor if you can do physical therapy right now on your left knee. If so, let us know and we can get your some physical therapy.   I would like to see you again in 6 months or sooner if needed.  Cock up wrist splint for carpal tunnel symptoms can be bought in local drug stores or online. This should especially be worn at night while sleeping.      The physicians and staff at [facility] are committed to providing excellent care. You may receive a survey requesting feedback about your experience at our office. We strive to receive "very good" responses to the survey questions. If you feel that your experience would prevent you from giving the office a "very good " response, please contact our office to try to remedy the situation. We may be reached at [phone]. Thank you for taking the time out of your busy day to complete the survey.  Kai Levins, MD [facility]  Preventing Falls at [facility] are common, often dreaded events in the lives of older people. Aside from the obvious injuries and even death that may result, fall can cause wide-ranging consequences including loss of independence, mental decline, decreased activity and mobility. Younger people are also at risk of falling, especially those with chronic illnesses and fatigue.  Ways to reduce risk for falling Examine diet and medications. Warm foods and alcohol dilate blood vessels, which can lead to dizziness when standing. Sleep aids, antidepressants and pain medications can also increase the likelihood of a fall.  Get a vision exam. Poor vision, cataracts and glaucoma increase the chances of falling.  Check foot gear. Shoes should fit snugly and have a sturdy, nonskid sole and a broad, low heel  Participate in a physician-approved exercise program to build and maintain muscle strength and improve balance and coordination.  Programs that use ankle weights or stretch bands are excellent for muscle-strengthening. Water aerobics programs and low-impact Tai Chi programs have also been shown to improve balance and coordination.  Increase vitamin D intake. Vitamin D improves muscle strength and increases the amount of calcium the body is able to absorb and deposit in bones.  How to prevent falls from common hazards Floors - Remove all loose wires, cords, and throw rugs. Minimize clutter. Make sure rugs are anchored and smooth. Keep furniture in its usual place.  Chairs -- Use chairs with straight backs, armrests and firm seats. Add firm cushions to existing pieces to add height.  Bathroom - Install grab bars and non-skid tape in the tub or shower. Use a bathtub transfer bench or a shower chair with a back support Use an elevated toilet seat and/or safety rails to assist standing from a low surface. Do not use towel racks or bathroom tissue holders to help you stand.  Lighting - Make sure halls, stairways, and entrances are well-lit. Install a night light in your bathroom or hallway. Make sure there is a light switch at the top and bottom of the staircase. Turn lights on if you get up in the middle of the night. Make sure lamps or light switches are within reach of the bed if you have to get up during the night.  Kitchen - Install non-skid rubber mats near the sink and stove. Clean spills immediately. Store frequently used utensils, pots, pans between waist and eye level. This helps prevent reaching and bending. Sit when getting  things out of lower cupboards.  Living room/ Bedrooms - Place furniture with wide spaces in between, giving enough room to move around. Establish a route through the living room that gives you something to hold onto as you walk.  Stairs - Make sure treads, rails, and rugs are secure. Install a rail on both sides of the stairs. If stairs are a threat, it might be helpful to arrange most of your  activities on the lower level to reduce the number of times you must climb the stairs.  Entrances and doorways - Install metal handles on the walls adjacent to the doorknobs of all doors to make it more secure as you travel through the doorway.  Tips for maintaining balance Keep at least one hand free at all times. Try using a backpack or fanny pack to hold things rather than carrying them in your hands. Never carry objects in both hands when walking as this interferes with keeping your balance.  Attempt to swing both arms from front to back while walking. This might require a conscious effort if Parkinson's disease has diminished your movement. It will, however, help you to maintain balance and posture, and reduce fatigue.  Consciously lift your feet off of the ground when walking. Shuffling and dragging of the feet is a common culprit in losing your balance.  When trying to navigate turns, use a "U" technique of facing forward and making a wide turn, rather than pivoting sharply.  Try to stand with your feet shoulder-length apart. When your feet are close together for any length of time, you increase your risk of losing your balance and falling.  Do one thing at a time. Don't try to walk and accomplish another task, such as reading or looking around. The decrease in your automatic reflexes complicates motor function, so the less distraction, the better.  Do not wear rubber or gripping soled shoes, they might "catch" on the floor and cause tripping.  Move slowly when changing positions. Use deliberate, concentrated movements and, if needed, use a grab bar or walking aid. Count 15 seconds between each movement. For example, when rising from a seated position, wait 15 seconds after standing to begin walking.  If balance is a continuous problem, you might want to consider a walking aid such as a cane, walking stick, or walker. Once you've mastered walking with help, you might be ready to try it on  your own again.

## 2022-10-19 ENCOUNTER — Ambulatory Visit (INDEPENDENT_AMBULATORY_CARE_PROVIDER_SITE_OTHER): Payer: Medicare Other | Admitting: Sports Medicine

## 2022-10-19 VITALS — BP 130/80 | HR 93 | Ht 68.0 in | Wt 208.0 lb

## 2022-10-19 DIAGNOSIS — M25562 Pain in left knee: Secondary | ICD-10-CM | POA: Diagnosis not present

## 2022-10-19 DIAGNOSIS — M1712 Unilateral primary osteoarthritis, left knee: Secondary | ICD-10-CM

## 2022-10-19 DIAGNOSIS — M23322 Other meniscus derangements, posterior horn of medial meniscus, left knee: Secondary | ICD-10-CM | POA: Diagnosis not present

## 2022-10-19 DIAGNOSIS — M5442 Lumbago with sciatica, left side: Secondary | ICD-10-CM

## 2022-10-19 DIAGNOSIS — G8929 Other chronic pain: Secondary | ICD-10-CM

## 2022-10-19 NOTE — Patient Instructions (Addendum)
Good to see you Referral to ortho  Referral to PT back  As needed follow up

## 2022-10-20 ENCOUNTER — Ambulatory Visit: Payer: Medicare Other | Admitting: Sports Medicine

## 2022-10-24 ENCOUNTER — Telehealth: Payer: Self-pay

## 2022-10-24 ENCOUNTER — Other Ambulatory Visit (HOSPITAL_COMMUNITY): Payer: Self-pay

## 2022-10-24 NOTE — Telephone Encounter (Signed)
PA request received via CMM for Ipratropium-Albuterol 0.5-2.5 (3)MG/3ML solution  PA not submitted due to nebulizer solutions being covered under Medicare Part B  Key: [id number]

## 2022-10-30 NOTE — Therapy (Signed)
OUTPATIENT PHYSICAL THERAPY THORACOLUMBAR EVALUATION   Patient Name: Frances Nelson MRN: [id number] DOB:[birth year],67 y.o., female Today's Date: 10/31/2022   END OF SESSION:  PT End of Session - 10/31/22 0952     Visit Number 1    Number of Visits 17    Date for PT Re-Evaluation 12/30/22    Authorization Type MCR    Progress Note Due on Visit 10    PT Start Time 0932    PT Stop Time 1015    PT Time Calculation (min) 43 min    Activity Tolerance Patient tolerated treatment well    Behavior During Therapy WFL for tasks assessed/performed              Past Medical History:  Diagnosis Date   Arthritis    Asthma    COPD (chronic obstructive pulmonary disease)    GERD (gastroesophageal reflux disease)    Hypertension    Pneumonia    Past Surgical History:  Procedure Laterality Date   ABDOMINAL HYSTERECTOMY     APPENDECTOMY     HERNIA REPAIR     Umbilical x 3   TONSILLECTOMY     TRANSFORAMINAL LUMBAR INTERBODY FUSION (TLIF) WITH PEDICLE SCREW FIXATION 1 LEVEL N/A 05/18/2022   Procedure: Lumbar Four-Five Open Laminectomy/Transforaminal Lumbar Interbody Fusion/Posterolateral fusion;  Surgeon: Jadene Pierini, MD;  Location: [facility] OR;  Service: Neurosurgery;  Laterality: N/A;   Patient Active Problem List   Diagnosis Date Noted   Urethritis 10/05/2022   External otitis of left ear 10/05/2022   Spondylolisthesis of lumbar region 05/18/2022   Smoker 04/08/2022   Deviated nasal septum 04/08/2022   Bilateral pain of leg and foot 04/08/2022   Follicular acne 04/06/2022   Baker cyst, left 04/06/2022   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   Depression 10/04/2021   Left knee pain 10/04/2021   Chronic pain 04/09/2021   Pelvic pain 04/09/2021   HLD (hyperlipidemia) 04/09/2021   Vitamin D deficiency 04/09/2021   Estrogen deficiency 04/09/2021   Hyperglycemia 04/01/2021   Chronic cough 12/30/2020   GERD (gastroesophageal reflux  disease) 11/06/2019   Bilateral leg edema 10/16/2019   Pleuritic chest pain 10/16/2019   Hypertension 10/02/2019   Arthralgia 06/28/2018   Sinusitis 06/28/2018   Renal cyst 05/03/2018   Syncope 05/03/2018   Stress and adjustment reaction 05/03/2018   History of tobacco use 05/02/2018   Primary osteoarthritis of both hands 04/10/2018   Primary osteoarthritis of both knees 04/10/2018   Primary osteoarthritis of both feet 04/10/2018   Achilles tendinitis 04/02/2018   Screening for breast cancer 03/15/2018   Positive ANA (antinuclear antibody) 03/15/2018   Screening for colon cancer 03/15/2018   Rash 03/15/2018   Achilles tendon pain 11/13/2017   Cervical myelopathy with cervical radiculopathy (HCC) 11/13/2017   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   Allergic rhinitis 06/19/2017   Edema 12/24/2015   Carpal tunnel syndrome, right 12/16/2015   Hypercalcemia 11/11/2015   Pain in both upper extremities 11/11/2015   Drug reaction 10/28/2015   Muscle spasms of both lower extremities 10/28/2015   Nerve pain 10/28/2015   Chronic asthmatic bronchitis with acute exacerbation 09/06/2015   Environmental and seasonal allergies 07/17/2014    PCP: Corwin Levins, MD  REFERRING PROVIDER: Richardean Sale, DO  REFERRING DIAG: chronic bilateral low back pain with left-sided sciatica   Rationale for Evaluation and Treatment: Rehabilitation  THERAPY DIAG:  Chronic low back pain with left-sided sciatica, unspecified back pain laterality  Muscle weakness (  generalized)  Other abnormalities of gait and mobility  ONSET DATE: chronic   SUBJECTIVE:                                                                                                                                                                                           SUBJECTIVE STATEMENT: Patient reports she is still having a lot of pain in her back. She reports neuropathy in both of her feet and legs and has a torn meniscus in her Lt knee  and achilles tendonitis. "My left leg is bad." She had lumbar fusion on 06/07/22 , but this did not relieve her pain. She saw neurology recently for her neuropathy who suggested that it is necessary to figure out the root cause of her ongoing pain (back, knee, foot, or neuropathy). She is seeing orthopedic surgeon on 4/29 for her knee. She reports no feeling from her toes to her knee on the LLE and no feeling from her toes to her lower leg on the RLE. She reports she is in pain 24/7 and does not experience any relief. In regards to her back pain this goes from the low back and courses along the posterolateral LLE described as an electrical shock down the LLE. No red flag symptoms. She has not received any PT since her lumbar surgery.   PERTINENT HISTORY:  TLIF L4-21 May 2022 COPD  PAIN:  Are you having pain? Yes: NPRS scale: 8/10 Pain location: low back Pain description: dull, ache,constant Aggravating factors: "everything" Relieving factors: heat   Are you having pain? Yes: NPRS scale: 8/10 Pain location: Lt knee Pain description: burning, bruised  Aggravating factors: walking, sitting Relieving factors: "not much"   PRECAUTIONS: Fall  WEIGHT BEARING RESTRICTIONS: No  FALLS:  Has patient fallen in last 6 months? Yes. Number of falls 1 (bent over to pick something up and LOB)  LIVING ENVIRONMENT: Lives with: lives with their family Lives in: House/apartment Stairs: Yes: Internal: flight steps; on left going up Has following equipment at home: Single point cane and Walker - 2 wheeled  OCCUPATION: retired  PLOF: Independent  PATIENT GOALS: "feeling better"    OBJECTIVE:   DIAGNOSTIC FINDINGS:  Lumbar MRI IMPRESSION: 1. No evidence of fractures. 2. Osteopenia and degenerative change. 3. Slight levorotary lumbar scoliosis apex L2, minimal grade 1 L4-5 spondylolisthesis unchanged. 4. L4-5 dorsal fusion construct with interbody hardware and laminectomy. 5. Aortic  atherosclerosis.  PATIENT SURVEYS:  FOTO 39% function to 47% predicted  SCREENING FOR RED FLAGS: Bowel or bladder incontinence: No Spinal tumors: No Cauda equina syndrome: No Compression fracture: No Abdominal aneurysm: No  COGNITION: Overall cognitive  status: Within functional limits for tasks assessed     SENSATION: Diminished sensation in bilateral lower legs and feet    POSTURE: rounded shoulders, forward head, and flexed trunk   PALPATION: Not assessed   LUMBAR ROM:   AROM eval  Flexion 50% limited  Extension 75% limited  Right lateral flexion 50% limited  Left lateral flexion 50% limited  Right rotation 75% limited  Left rotation 75% limited    (Blank rows = not tested) pain with all lumbar AROM   LOWER EXTREMITY ROM:     Active  Right eval Left eval  Hip flexion    Hip extension    Hip abduction    Hip adduction    Hip internal rotation    Hip external rotation    Knee flexion    Knee extension    Ankle dorsiflexion    Ankle plantarflexion    Ankle inversion    Ankle eversion     (Blank rows = not tested)  LOWER EXTREMITY MMT:    MMT Right eval Left eval  Hip flexion 4- 3+  Hip extension    Hip abduction 4- 3+  Hip adduction    Hip internal rotation    Hip external rotation    Knee flexion 4 3+ pn  Knee extension 4 3+ pn  Ankle dorsiflexion 4+ 4+   Ankle plantarflexion 4+ 4+  Ankle inversion    Ankle eversion 4+ 4+   (Blank rows = not tested)  LUMBAR SPECIAL TESTS:  SLR (+)   FUNCTIONAL TESTS:  TUG: 20 seconds   GAIT: Distance walked: 10 ft  Assistive device utilized: Single point cane Level of assistance: Modified independence Comments: antalgic Lt, excessive frontal plane movement, decreased step length   [facility] Adult PT Treatment:                                                DATE: 10/31/22  Therapeutic Exercise: Demonstrated and issued initial HEP.   Therapeutic Activity: Education on assessment findings that will be  addressed throughout duration of POC.     PATIENT EDUCATION:  Education details: see treatment Person educated: Patient Education method: Explanation, Demonstration, Tactile cues, Verbal cues, and Handouts Education comprehension: verbalized understanding, returned demonstration, verbal cues required, tactile cues required, and needs further education  HOME EXERCISE PROGRAM: Access Code: [email or web address] URL: [email or web address] Date: 10/31/2022 Prepared by: Letitia Libra  Exercises - Supine Lower Trunk Rotation  - 1 x daily - 7 x weekly - 2 sets - 10 reps - Hooklying Single Knee to Chest Stretch  - 1 x daily - 7 x weekly - 1 sets - 10 reps - 5 sec  hold - Supine March  - 1 x daily - 7 x weekly - 2 sets - 10 reps  ASSESSMENT:  CLINICAL IMPRESSION: Patient is a 67 y.o. female who was seen today for physical therapy evaluation and treatment for chronic low back and LLE pain. She does report multiple issues today including: recent lumbar fusion with ongoing back and LLE pain, Lt knee pain attributed to meniscal tear, Lt foot pain attributed to achilles tendonitis, and neuropathy. She had recent visit with neurology who suggested that it is important to determine the root cause of her pain. She is now scheduled to see an orthopedic surgeon at the end of the month for her Lt knee  pain. It is likely that her chronic pain is exacerbated by a culmination of these orthopedic conditions. Upon assessment she is noted to have limited and painful trunk AROM, hip and knee weakness, gait abnormalities, aberrant lifting mechanics, and scores at an increased fall risk based upon TUG score. She will benefit from skilled PT to address the above stated deficits in order to optimize her function and assist in overall pain reduction.   OBJECTIVE IMPAIRMENTS: Abnormal gait, decreased activity tolerance, decreased balance, decreased endurance, decreased knowledge of condition, decreased mobility,  difficulty walking, decreased ROM, decreased strength, impaired flexibility, improper body mechanics, postural dysfunction, and pain.   ACTIVITY LIMITATIONS: carrying, lifting, bending, sitting, standing, squatting, stairs, transfers, bed mobility, and locomotion level  PARTICIPATION LIMITATIONS: meal prep, cleaning, laundry, shopping, community activity, and yard work  PERSONAL FACTORS: Age, Fitness, Time since onset of injury/illness/exacerbation, and 3+ comorbidities: see PMH above  are also affecting patient's functional outcome.   REHAB POTENTIAL: Fair chronicity,co-morbidities  CLINICAL DECISION MAKING: Evolving/moderate complexity  EVALUATION COMPLEXITY: Moderate   GOALS: Goals reviewed with patient? Yes  SHORT TERM GOALS: Target date: 11/28/2022    Patient will be independent and compliant with initial HEP.   Baseline: see above Goal status: INITIAL  2.  Patient will demonstrate proper bending/lifting mechanics to reduce stress on her back.  Baseline: see above  Goal status: INITIAL  3.  Patient will complete TUG in </= 15 seconds to reduce her risk of future falls.  Baseline: see above  Goal status: INITIAL   LONG TERM GOALS: Target date: 12/30/22  Patient will score at least 47% on FOTO to signify clinically meaningful improvement in functional abilities.   Baseline: see above Goal status: INITIAL  2.  Patient will demonstrate at least 4/5 bilateral hip strength to improve stability about the chain with prolonged walking/standing activity.  Baseline: see above Goal status: INITIAL  3.  Patient will demonstrate 4/5 Lt knee strength to improve stability with stair/curb negotiation.  Baseline: see above Goal status: INITIAL  4.  Patient will report pain as </=5/10 to reduce her current functional limitations.  Baseline: see above Goal status: INITIAL  5.  Patient will be independent with advanced home program to assist in management of her chronic  condition.  Baseline: see above  Goal status: INITIAL   PLAN:  PT FREQUENCY: 1-2x/week  PT DURATION: 8 weeks  PLANNED INTERVENTIONS: Therapeutic exercises, Therapeutic activity, Neuromuscular re-education, Balance training, Gait training, Patient/Family education, Self Care, Joint mobilization, Aquatic Therapy, Dry Needling, Cryotherapy, Moist heat, Manual therapy, and Re-evaluation.  PLAN FOR NEXT SESSION: review and progress HEP prn; lumbar mobility, hip/knee strengthening, lifting mechanics  Letitia Libra, PT, DPT, ATC 10/31/22 12:50 PM

## 2022-10-31 ENCOUNTER — Ambulatory Visit: Payer: Medicare Other | Attending: Sports Medicine

## 2022-10-31 ENCOUNTER — Other Ambulatory Visit: Payer: Self-pay

## 2022-10-31 DIAGNOSIS — G8929 Other chronic pain: Secondary | ICD-10-CM | POA: Insufficient documentation

## 2022-10-31 DIAGNOSIS — R2689 Other abnormalities of gait and mobility: Secondary | ICD-10-CM | POA: Insufficient documentation

## 2022-10-31 DIAGNOSIS — M5442 Lumbago with sciatica, left side: Secondary | ICD-10-CM | POA: Diagnosis present

## 2022-10-31 DIAGNOSIS — M6281 Muscle weakness (generalized): Secondary | ICD-10-CM | POA: Diagnosis present

## 2022-11-06 ENCOUNTER — Encounter: Payer: Self-pay | Admitting: Physical Therapy

## 2022-11-06 ENCOUNTER — Ambulatory Visit: Payer: Medicare Other | Admitting: Physical Therapy

## 2022-11-06 DIAGNOSIS — R2689 Other abnormalities of gait and mobility: Secondary | ICD-10-CM

## 2022-11-06 DIAGNOSIS — M6281 Muscle weakness (generalized): Secondary | ICD-10-CM

## 2022-11-06 DIAGNOSIS — G8929 Other chronic pain: Secondary | ICD-10-CM

## 2022-11-06 DIAGNOSIS — M5442 Lumbago with sciatica, left side: Secondary | ICD-10-CM | POA: Diagnosis not present

## 2022-11-06 NOTE — Therapy (Signed)
OUTPATIENT PHYSICAL THERAPY TREATMENT NOTE   Patient Name: Frances Nelson MRN: [id number] DOB:05-Jun-1956, 67 y.o., female Today's Date: 11/06/2022  PCP: Corwin Levins, MD   REFERRING PROVIDER: Richardean Sale, DO  END OF SESSION:   PT End of Session - 11/06/22 1403     Visit Number 2    Number of Visits 17    Date for PT Re-Evaluation 12/30/22    Authorization Type MCR    Progress Note Due on Visit 10    PT Start Time 1400    PT Stop Time 1444    PT Time Calculation (min) 44 min             Past Medical History:  Diagnosis Date   Arthritis    Asthma    COPD (chronic obstructive pulmonary disease)    GERD (gastroesophageal reflux disease)    Hypertension    Pneumonia    Past Surgical History:  Procedure Laterality Date   ABDOMINAL HYSTERECTOMY     APPENDECTOMY     HERNIA REPAIR     Umbilical x 3   TONSILLECTOMY     TRANSFORAMINAL LUMBAR INTERBODY FUSION (TLIF) WITH PEDICLE SCREW FIXATION 1 LEVEL N/A 05/18/2022   Procedure: Lumbar Four-Five Open Laminectomy/Transforaminal Lumbar Interbody Fusion/Posterolateral fusion;  Surgeon: Jadene Pierini, MD;  Location: [facility] OR;  Service: Neurosurgery;  Laterality: N/A;   Patient Active Problem List   Diagnosis Date Noted   Urethritis 10/05/2022   External otitis of left ear 10/05/2022   Spondylolisthesis of lumbar region 05/18/2022   Smoker 04/08/2022   Deviated nasal septum 04/08/2022   Bilateral pain of leg and foot 04/08/2022   Follicular acne 04/06/2022   Baker cyst, left 04/06/2022   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   Depression 10/04/2021   Left knee pain 10/04/2021   Chronic pain 04/09/2021   Pelvic pain 04/09/2021   HLD (hyperlipidemia) 04/09/2021   Vitamin D deficiency 04/09/2021   Estrogen deficiency 04/09/2021   Hyperglycemia 04/01/2021   Chronic cough 12/30/2020   GERD (gastroesophageal reflux disease) 11/06/2019   Bilateral leg edema 10/16/2019    Pleuritic chest pain 10/16/2019   Hypertension 10/02/2019   Arthralgia 06/28/2018   Sinusitis 06/28/2018   Renal cyst 05/03/2018   Syncope 05/03/2018   Stress and adjustment reaction 05/03/2018   History of tobacco use 05/02/2018   Primary osteoarthritis of both hands 04/10/2018   Primary osteoarthritis of both knees 04/10/2018   Primary osteoarthritis of both feet 04/10/2018   Achilles tendinitis 04/02/2018   Screening for breast cancer 03/15/2018   Positive ANA (antinuclear antibody) 03/15/2018   Screening for colon cancer 03/15/2018   Rash 03/15/2018   Achilles tendon pain 11/13/2017   Cervical myelopathy with cervical radiculopathy (HCC) 11/13/2017   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   Allergic rhinitis 06/19/2017   Edema 12/24/2015   Carpal tunnel syndrome, right 12/16/2015   Hypercalcemia 11/11/2015   Pain in both upper extremities 11/11/2015   Drug reaction 10/28/2015   Muscle spasms of both lower extremities 10/28/2015   Nerve pain 10/28/2015   Chronic asthmatic bronchitis with acute exacerbation 09/06/2015   Environmental and seasonal allergies 07/17/2014    REFERRING DIAG: chronic bilateral low back pain with left-sided sciatica  THERAPY DIAG:  Chronic low back pain with left-sided sciatica, unspecified back pain laterality  Muscle weakness (generalized)  Other abnormalities of gait and mobility  Rationale for Evaluation and Treatment Rehabilitation  PERTINENT HISTORY:  TLIF L4-21 May 2022 COPD  PRECAUTIONS:  FallPRECAUTIONS: Fall  SUBJECTIVE:                                                                                                                                                                                      SUBJECTIVE STATEMENT:  I have done the exercises a few times. Left knee is really bothering me today.    PAIN:  Are you having pain? Yes: NPRS scale: 6/10 Pain location: low back Pain description: dull, ache,constant Aggravating  factors: "everything" Relieving factors: heat   OBJECTIVE: (objective measures completed at initial evaluation unless otherwise dated)   DIAGNOSTIC FINDINGS:  Lumbar MRI IMPRESSION: 1. No evidence of fractures. 2. Osteopenia and degenerative change. 3. Slight levorotary lumbar scoliosis apex L2, minimal grade 1 L4-5 spondylolisthesis unchanged. 4. L4-5 dorsal fusion construct with interbody hardware and laminectomy. 5. Aortic atherosclerosis.   PATIENT SURVEYS:  FOTO 39% function to 47% predicted   SCREENING FOR RED FLAGS: Bowel or bladder incontinence: No Spinal tumors: No Cauda equina syndrome: No Compression fracture: No Abdominal aneurysm: No   COGNITION: Overall cognitive status: Within functional limits for tasks assessed                          SENSATION: Diminished sensation in bilateral lower legs and feet      POSTURE: rounded shoulders, forward head, and flexed trunk    PALPATION: Not assessed    LUMBAR ROM:    AROM eval  Flexion 50% limited  Extension 75% limited  Right lateral flexion 50% limited  Left lateral flexion 50% limited  Right rotation 75% limited  Left rotation 75% limited    (Blank rows = not tested) pain with all lumbar AROM    LOWER EXTREMITY ROM:      Active  Right eval Left eval  Hip flexion      Hip extension      Hip abduction      Hip adduction      Hip internal rotation      Hip external rotation      Knee flexion      Knee extension      Ankle dorsiflexion      Ankle plantarflexion      Ankle inversion      Ankle eversion       (Blank rows = not tested)   LOWER EXTREMITY MMT:     MMT Right eval Left eval  Hip flexion 4- 3+  Hip extension      Hip abduction 4- 3+  Hip adduction      Hip internal rotation      Hip external rotation  Knee flexion 4 3+ pn  Knee extension 4 3+ pn  Ankle dorsiflexion 4+ 4+   Ankle plantarflexion 4+ 4+  Ankle inversion      Ankle eversion 4+ 4+   (Blank rows = not  tested)   LUMBAR SPECIAL TESTS:  SLR (+)    FUNCTIONAL TESTS:  TUG: 20 seconds    GAIT: Distance walked: 10 ft  Assistive device utilized: Single point cane Level of assistance: Modified independence Comments: antalgic Lt, excessive frontal plane movement, decreased step length    [facility] Adult PT Treatment:                                                DATE: 11/06/22 Therapeutic Exercise: Nustep L1 UE/LE x 4 minutes (resistance started at 3 , reduced to 1 due to c/o left knee pain LTR Supine Marching  SKTC Side clam AROM x 12 each HL ball squeeze with Ab draw in - cues for breathing      [facility] Adult PT Treatment:                                                DATE: 10/31/22   Therapeutic Exercise: Demonstrated and issued initial HEP.    Therapeutic Activity: Education on assessment findings that will be addressed throughout duration of POC.        PATIENT EDUCATION:  Education details: see treatment Person educated: Patient Education method: Explanation, Demonstration, Tactile cues, Verbal cues, and Handouts Education comprehension: verbalized understanding, returned demonstration, verbal cues required, tactile cues required, and needs further education   HOME EXERCISE PROGRAM: Access Code: [email or web address] URL: [email or web address] Date: 10/31/2022 Prepared by: Letitia Libra   Exercises - Supine Lower Trunk Rotation  - 1 x daily - 7 x weekly - 2 sets - 10 reps - Hooklying Single Knee to Chest Stretch  - 1 x daily - 7 x weekly - 1 sets - 10 reps - 5 sec  hold - Supine March  - 1 x daily - 7 x weekly - 2 sets - 10 reps Added 11/06/22 - Clamshell  - 1 x daily - 7 x weekly - 1 sets - 10 reps - 3 hold   ASSESSMENT:   CLINICAL IMPRESSION: Patient is a 67 y.o. female who was seen today for physical therapy treatment for chronic low back and LLE pain with h/o lumbar fusion and left meniscal tear. Sees MD for knee on 11/13/22. Reviewed HEP. Began Nustep with poor  tolerance due to c/o left knee burning. She has pain with transitions and transfers during session. Progressed with lateral hip strength in side lying with fair tolerance. Updated HEP. She will benefit from skilled PT to address the above stated deficits in order to optimize her function and assist in overall pain reduction.    OBJECTIVE IMPAIRMENTS: Abnormal gait, decreased activity tolerance, decreased balance, decreased endurance, decreased knowledge of condition, decreased mobility, difficulty walking, decreased ROM, decreased strength, impaired flexibility, improper body mechanics, postural dysfunction, and pain.    ACTIVITY LIMITATIONS: carrying, lifting, bending, sitting, standing, squatting, stairs, transfers, bed mobility, and locomotion level   PARTICIPATION LIMITATIONS: meal prep, cleaning, laundry, shopping, community activity, and yard work   PERSONAL FACTORS: Age, Fitness, Time since onset  of injury/illness/exacerbation, and 3+ comorbidities: see PMH above  are also affecting patient's functional outcome.    REHAB POTENTIAL: Fair chronicity,co-morbidities   CLINICAL DECISION MAKING: Evolving/moderate complexity   EVALUATION COMPLEXITY: Moderate     GOALS: Goals reviewed with patient? Yes   SHORT TERM GOALS: Target date: 11/28/2022       Patient will be independent and compliant with initial HEP.    Baseline: see above Goal status: INITIAL   2.  Patient will demonstrate proper bending/lifting mechanics to reduce stress on her back.  Baseline: see above  Goal status: INITIAL   3.  Patient will complete TUG in </= 15 seconds to reduce her risk of future falls.  Baseline: see above  Goal status: INITIAL     LONG TERM GOALS: Target date: 12/30/22   Patient will score at least 47% on FOTO to signify clinically meaningful improvement in functional abilities.    Baseline: see above Goal status: INITIAL   2.  Patient will demonstrate at least 4/5 bilateral hip  strength to improve stability about the chain with prolonged walking/standing activity.  Baseline: see above Goal status: INITIAL   3.  Patient will demonstrate 4/5 Lt knee strength to improve stability with stair/curb negotiation.  Baseline: see above Goal status: INITIAL   4.  Patient will report pain as </=5/10 to reduce her current functional limitations.  Baseline: see above Goal status: INITIAL   5.  Patient will be independent with advanced home program to assist in management of her chronic condition.  Baseline: see above  Goal status: INITIAL     PLAN:   PT FREQUENCY: 1-2x/week   PT DURATION: 8 weeks   PLANNED INTERVENTIONS: Therapeutic exercises, Therapeutic activity, Neuromuscular re-education, Balance training, Gait training, Patient/Family education, Self Care, Joint mobilization, Aquatic Therapy, Dry Needling, Cryotherapy, Moist heat, Manual therapy, and Re-evaluation.   PLAN FOR NEXT SESSION: review and progress HEP prn; lumbar mobility, hip/knee strengthening, lifting mechanics     Jannette Spanner, PTA 11/06/22 2:46 PM Phone: [phone] Fax: [phone]

## 2022-11-09 ENCOUNTER — Ambulatory Visit: Payer: Medicare Other | Admitting: Physical Therapy

## 2022-11-09 ENCOUNTER — Encounter: Payer: Self-pay | Admitting: Physical Therapy

## 2022-11-09 DIAGNOSIS — M6281 Muscle weakness (generalized): Secondary | ICD-10-CM

## 2022-11-09 DIAGNOSIS — M5442 Lumbago with sciatica, left side: Secondary | ICD-10-CM | POA: Diagnosis not present

## 2022-11-09 DIAGNOSIS — G8929 Other chronic pain: Secondary | ICD-10-CM

## 2022-11-09 DIAGNOSIS — R2689 Other abnormalities of gait and mobility: Secondary | ICD-10-CM

## 2022-11-09 NOTE — Therapy (Addendum)
OUTPATIENT PHYSICAL THERAPY TREATMENT NOTE PHYSICAL THERAPY DISCHARGE SUMMARY  Visits from Start of Care: 3  Current functional level related to goals / functional outcomes: No re-assessment of goals.    Remaining deficits: Status unknown   Education / Equipment: N/A   Patient agrees to discharge. Patient goals were not met. Patient is being discharged due to not returning since the last visit.   Patient Name: Frances Nelson MRN: [id number] DOB:1955/09/24, 67 y.o., female Today's Date: 11/09/2022  PCP: Corwin Levins, MD   REFERRING PROVIDER: Richardean Sale, DO  END OF SESSION:   PT End of Session - 11/09/22 1405     Visit Number 3    Number of Visits 17    Date for PT Re-Evaluation 12/30/22    Authorization Type MCR    Progress Note Due on Visit 10    PT Start Time 1400    PT Stop Time 1440    PT Time Calculation (min) 40 min             Past Medical History:  Diagnosis Date   Arthritis    Asthma    COPD (chronic obstructive pulmonary disease)    GERD (gastroesophageal reflux disease)    Hypertension    Pneumonia    Past Surgical History:  Procedure Laterality Date   ABDOMINAL HYSTERECTOMY     APPENDECTOMY     HERNIA REPAIR     Umbilical x 3   TONSILLECTOMY     TRANSFORAMINAL LUMBAR INTERBODY FUSION (TLIF) WITH PEDICLE SCREW FIXATION 1 LEVEL N/A 05/18/2022   Procedure: Lumbar Four-Five Open Laminectomy/Transforaminal Lumbar Interbody Fusion/Posterolateral fusion;  Surgeon: Jadene Pierini, MD;  Location: [facility] OR;  Service: Neurosurgery;  Laterality: N/A;   Patient Active Problem List   Diagnosis Date Noted   Urethritis 10/05/2022   External otitis of left ear 10/05/2022   Spondylolisthesis of lumbar region 05/18/2022   Smoker 04/08/2022   Deviated nasal septum 04/08/2022   Bilateral pain of leg and foot 04/08/2022   Follicular acne 04/06/2022   Baker cyst, left 04/06/2022   Spinal stenosis of lumbar region with neurogenic claudication  10/28/2021   Depression 10/04/2021   Left knee pain 10/04/2021   Chronic pain 04/09/2021   Pelvic pain 04/09/2021   HLD (hyperlipidemia) 04/09/2021   Vitamin D deficiency 04/09/2021   Estrogen deficiency 04/09/2021   Hyperglycemia 04/01/2021   Chronic cough 12/30/2020   GERD (gastroesophageal reflux disease) 11/06/2019   Bilateral leg edema 10/16/2019   Pleuritic chest pain 10/16/2019   Hypertension 10/02/2019   Arthralgia 06/28/2018   Sinusitis 06/28/2018   Renal cyst 05/03/2018   Syncope 05/03/2018   Stress and adjustment reaction 05/03/2018   History of tobacco use 05/02/2018   Primary osteoarthritis of both hands 04/10/2018   Primary osteoarthritis of both knees 04/10/2018   Primary osteoarthritis of both feet 04/10/2018   Achilles tendinitis 04/02/2018   Screening for breast cancer 03/15/2018   Positive ANA (antinuclear antibody) 03/15/2018   Screening for colon cancer 03/15/2018   Rash 03/15/2018   Achilles tendon pain 11/13/2017   Cervical myelopathy with cervical radiculopathy (HCC) 11/13/2017   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   Allergic rhinitis 06/19/2017   Edema 12/24/2015   Carpal tunnel syndrome, right 12/16/2015   Hypercalcemia 11/11/2015   Pain in both upper extremities 11/11/2015   Drug reaction 10/28/2015   Muscle spasms of both lower extremities 10/28/2015   Nerve pain 10/28/2015   Chronic asthmatic bronchitis with acute exacerbation 09/06/2015   Environmental  and seasonal allergies 07/17/2014    REFERRING DIAG: chronic bilateral low back pain with left-sided sciatica  THERAPY DIAG:  Chronic low back pain with left-sided sciatica, unspecified back pain laterality  Muscle weakness (generalized)  Other abnormalities of gait and mobility  Rationale for Evaluation and Treatment Rehabilitation  PERTINENT HISTORY:  TLIF L4-21 May 2022 COPD  PRECAUTIONS: FallPRECAUTIONS: Fall  SUBJECTIVE:                                                                                                                                                                                       SUBJECTIVE STATEMENT: I was sore in my hips after last time. Pain is 5.5 in back and 7/10 in knee.    PAIN:  Are you having pain? Yes: NPRS scale: 5.5/10 Pain location: low back Pain description: dull, ache,constant Aggravating factors: "everything" Relieving factors: heat   OBJECTIVE: (objective measures completed at initial evaluation unless otherwise dated)   DIAGNOSTIC FINDINGS:  Lumbar MRI IMPRESSION: 1. No evidence of fractures. 2. Osteopenia and degenerative change. 3. Slight levorotary lumbar scoliosis apex L2, minimal grade 1 L4-5 spondylolisthesis unchanged. 4. L4-5 dorsal fusion construct with interbody hardware and laminectomy. 5. Aortic atherosclerosis.   PATIENT SURVEYS:  FOTO 39% function to 47% predicted   SCREENING FOR RED FLAGS: Bowel or bladder incontinence: No Spinal tumors: No Cauda equina syndrome: No Compression fracture: No Abdominal aneurysm: No   COGNITION: Overall cognitive status: Within functional limits for tasks assessed                          SENSATION: Diminished sensation in bilateral lower legs and feet      POSTURE: rounded shoulders, forward head, and flexed trunk    PALPATION: Not assessed    LUMBAR ROM:    AROM eval  Flexion 50% limited  Extension 75% limited  Right lateral flexion 50% limited  Left lateral flexion 50% limited  Right rotation 75% limited  Left rotation 75% limited    (Blank rows = not tested) pain with all lumbar AROM    LOWER EXTREMITY ROM:      Active  Right eval Left eval  Hip flexion      Hip extension      Hip abduction      Hip adduction      Hip internal rotation      Hip external rotation      Knee flexion      Knee extension      Ankle dorsiflexion      Ankle plantarflexion      Ankle inversion  Ankle eversion       (Blank rows = not tested)   LOWER  EXTREMITY MMT:     MMT Right eval Left eval  Hip flexion 4- 3+  Hip extension      Hip abduction 4- 3+  Hip adduction      Hip internal rotation      Hip external rotation      Knee flexion 4 3+ pn  Knee extension 4 3+ pn  Ankle dorsiflexion 4+ 4+   Ankle plantarflexion 4+ 4+  Ankle inversion      Ankle eversion 4+ 4+   (Blank rows = not tested)   LUMBAR SPECIAL TESTS:  SLR (+)    FUNCTIONAL TESTS:  TUG: 20 seconds    GAIT: Distance walked: 10 ft  Assistive device utilized: Single point cane Level of assistance: Modified independence Comments: antalgic Lt, excessive frontal plane movement, decreased step length    [facility] Adult PT Treatment:                                                DATE: 11/09/22 Therapeutic Exercise: Seated LAQ Seated heel slide foot on floor, left Seated march Seated heel and toe raises LTR x10 Hooklying ball squeeze 3 sec x 10 with ab draw in Supine Marching x 10 , cues for ab brace  SKTC x 3 each  Side clam AROM x 10 x 2 each  HL ball squeeze with Ab draw in - cues for breathing  HL isometric ab with exercise ball 3 sec x 10 with cues for breathing  HL gluteal squeeze- back pain Seated gluteal sets - better tolerated    [facility] Adult PT Treatment:                                                DATE: 11/06/22 Therapeutic Exercise: Nustep L1 UE/LE x 4 minutes (resistance started at 3 , reduced to 1 due to c/o left knee pain LTR Supine Marching  SKTC Side clam AROM x 12 each HL ball squeeze with Ab draw in - cues for breathing      [facility] Adult PT Treatment:                                                DATE: 10/31/22   Therapeutic Exercise: Demonstrated and issued initial HEP.    Therapeutic Activity: Education on assessment findings that will be addressed throughout duration of POC.        PATIENT EDUCATION:  Education details: see treatment Person educated: Patient Education method: Explanation, Demonstration, Tactile cues,  Verbal cues, and Handouts Education comprehension: verbalized understanding, returned demonstration, verbal cues required, tactile cues required, and needs further education   HOME EXERCISE PROGRAM: Access Code: [email or web address] URL: [email or web address] Date: 10/31/2022 Prepared by: Letitia Libra   Exercises - Supine Lower Trunk Rotation  - 1 x daily - 7 x weekly - 2 sets - 10 reps - Hooklying Single Knee to Chest Stretch  - 1 x daily - 7 x weekly - 1 sets - 10 reps - 5 sec  hold -  Supine March  - 1 x daily - 7 x weekly - 2 sets - 10 reps Added 11/06/22 - Clamshell  - 1 x daily - 7 x weekly - 1 sets - 10 reps - 3 hold Added 11/09/22:  - Seated Gluteal Sets  - 1 x daily - 7 x weekly - 1-2 sets - 10 reps - 3-5 hold   ASSESSMENT:   CLINICAL IMPRESSION: Patient is a 67 y.o. female who was seen today for physical therapy treatment for chronic low back and LLE pain with h/o lumbar fusion and left meniscal tear. Sees MD for knee on 11/13/22. Continued HEP and progressed with knee AROM and AAROM with pt limited by knee pain. Worked on isometric ab and gluteals with pt tolerated seated gluteals better than hooklying. These were added to HEP.  She will benefit from skilled PT to address the above stated deficits in order to optimize her function and assist in overall pain reduction.    OBJECTIVE IMPAIRMENTS: Abnormal gait, decreased activity tolerance, decreased balance, decreased endurance, decreased knowledge of condition, decreased mobility, difficulty walking, decreased ROM, decreased strength, impaired flexibility, improper body mechanics, postural dysfunction, and pain.    ACTIVITY LIMITATIONS: carrying, lifting, bending, sitting, standing, squatting, stairs, transfers, bed mobility, and locomotion level   PARTICIPATION LIMITATIONS: meal prep, cleaning, laundry, shopping, community activity, and yard work   PERSONAL FACTORS: Age, Fitness, Time since onset of  injury/illness/exacerbation, and 3+ comorbidities: see PMH above  are also affecting patient's functional outcome.    REHAB POTENTIAL: Fair chronicity,co-morbidities   CLINICAL DECISION MAKING: Evolving/moderate complexity   EVALUATION COMPLEXITY: Moderate     GOALS: Goals reviewed with patient? Yes   SHORT TERM GOALS: Target date: 11/28/2022       Patient will be independent and compliant with initial HEP.    Baseline: see above Goal status: INITIAL   2.  Patient will demonstrate proper bending/lifting mechanics to reduce stress on her back.  Baseline: see above  Goal status: INITIAL   3.  Patient will complete TUG in </= 15 seconds to reduce her risk of future falls.  Baseline: see above  Goal status: INITIAL     LONG TERM GOALS: Target date: 12/30/22   Patient will score at least 47% on FOTO to signify clinically meaningful improvement in functional abilities.    Baseline: see above Goal status: INITIAL   2.  Patient will demonstrate at least 4/5 bilateral hip strength to improve stability about the chain with prolonged walking/standing activity.  Baseline: see above Goal status: INITIAL   3.  Patient will demonstrate 4/5 Lt knee strength to improve stability with stair/curb negotiation.  Baseline: see above Goal status: INITIAL   4.  Patient will report pain as </=5/10 to reduce her current functional limitations.  Baseline: see above Goal status: INITIAL   5.  Patient will be independent with advanced home program to assist in management of her chronic condition.  Baseline: see above  Goal status: INITIAL     PLAN:   PT FREQUENCY: n/a   PT DURATION: n/a   PLANNED INTERVENTIONS: Therapeutic exercises, Therapeutic activity, Neuromuscular re-education, Balance training, Gait training, Patient/Family education, Self Care, Joint mobilization, Aquatic Therapy, Dry Needling, Cryotherapy, Moist heat, Manual therapy, and Re-evaluation.   PLAN FOR NEXT SESSION:  n/a   Jannette Spanner, PTA 11/09/22 2:48 PM Phone: [phone] Fax: [phone]   Letitia Libra, PT, DPT, ATC 02/13/23 1:24 PM

## 2022-11-13 ENCOUNTER — Encounter: Payer: Self-pay | Admitting: Orthopaedic Surgery

## 2022-11-13 ENCOUNTER — Ambulatory Visit (INDEPENDENT_AMBULATORY_CARE_PROVIDER_SITE_OTHER): Payer: Medicare Other | Admitting: Orthopaedic Surgery

## 2022-11-13 DIAGNOSIS — S83232A Complex tear of medial meniscus, current injury, left knee, initial encounter: Secondary | ICD-10-CM | POA: Diagnosis not present

## 2022-11-13 NOTE — Progress Notes (Signed)
The patient is a 67 year old female sent from Dr. Richardean Sale to evaluate and treat a known meniscal tear of her left knee.  She does have rheumatoid disease and also has been dealing with recovering from lumbar spine surgery.  She sees neurologist for peripheral neuropathy as well.  She has been dealing with left knee pain with locking and catching for over a year now.  She points to the medial aspect of her left knee as a source of her pain as well as the posterior aspect of the knee.  She does hurt with pivoting activities as well.  She has a MRI of that knee that was done in December of this past year and I have this for review today as well.  On exam her left knee has slight varus malalignment but is only slight.  She has definitely medial joint line tenderness and a positive McMurray's sign to the medial compartment of the knee.  The knee is ligamentously stable with good range of motion but certainly painful in the popliteal area.  The MRI of her left knee is reviewed and it does show complex posterior horn to mid body medial meniscal tear.  The cartilage is only thinned in the knee and no full-thickness cartilage loss.  The remainder of her knee has no acute findings.  There is a small Baker's cyst.  The lateral meniscus is intact and the ligaments are all intact.  I did describe to her arthroscopic surgery for her left knee and I think this is the next obvious step considering that she has had steroid injections at least 4 times for that knee and been through therapy as well as activity modification.  She has been ambulating with a cane as well to offload the knee.  We described the surgery involves showing a knee model and discussed the risk and benefits of surgery and what to expect from an intraoperative and postoperative standpoint.  She is interested in having a knee arthroscopy and we agree with this as well.  We would then see her back in 1 week postoperative once we have this set up.  All  questions and concerns were addressed and answered.

## 2022-11-15 ENCOUNTER — Ambulatory Visit: Payer: Medicare Other | Admitting: Physical Therapy

## 2022-11-17 ENCOUNTER — Ambulatory Visit: Payer: Medicare Other | Admitting: Physical Therapy

## 2022-11-20 ENCOUNTER — Encounter: Payer: Self-pay | Admitting: Neurology

## 2022-11-20 ENCOUNTER — Encounter: Payer: Self-pay | Admitting: Orthopaedic Surgery

## 2022-11-22 ENCOUNTER — Ambulatory Visit: Payer: Medicare Other | Admitting: Physical Therapy

## 2022-11-24 ENCOUNTER — Ambulatory Visit: Payer: Medicare Other | Admitting: Physical Therapy

## 2022-11-29 ENCOUNTER — Ambulatory Visit (INDEPENDENT_AMBULATORY_CARE_PROVIDER_SITE_OTHER): Payer: Medicare Other | Admitting: Dermatology

## 2022-11-29 ENCOUNTER — Encounter: Payer: Self-pay | Admitting: Dermatology

## 2022-11-29 ENCOUNTER — Ambulatory Visit: Payer: Medicare Other | Admitting: Physical Therapy

## 2022-11-29 VITALS — BP 120/77

## 2022-11-29 DIAGNOSIS — L578 Other skin changes due to chronic exposure to nonionizing radiation: Secondary | ICD-10-CM

## 2022-11-29 DIAGNOSIS — L281 Prurigo nodularis: Secondary | ICD-10-CM

## 2022-11-29 DIAGNOSIS — L814 Other melanin hyperpigmentation: Secondary | ICD-10-CM | POA: Diagnosis not present

## 2022-11-29 DIAGNOSIS — W908XXA Exposure to other nonionizing radiation, initial encounter: Secondary | ICD-10-CM

## 2022-11-29 DIAGNOSIS — L821 Other seborrheic keratosis: Secondary | ICD-10-CM

## 2022-11-29 DIAGNOSIS — D1801 Hemangioma of skin and subcutaneous tissue: Secondary | ICD-10-CM

## 2022-11-29 DIAGNOSIS — X32XXXA Exposure to sunlight, initial encounter: Secondary | ICD-10-CM

## 2022-11-29 DIAGNOSIS — Z1283 Encounter for screening for malignant neoplasm of skin: Secondary | ICD-10-CM

## 2022-11-29 DIAGNOSIS — L739 Follicular disorder, unspecified: Secondary | ICD-10-CM

## 2022-11-29 DIAGNOSIS — D229 Melanocytic nevi, unspecified: Secondary | ICD-10-CM

## 2022-11-29 DIAGNOSIS — R202 Paresthesia of skin: Secondary | ICD-10-CM

## 2022-11-29 MED ORDER — CLOBETASOL PROPIONATE 0.05 % EX CREA: 1.0000 | TOPICAL_CREAM | Freq: Two times a day (BID) | CUTANEOUS | 0 refills | Status: DC

## 2022-11-29 MED ORDER — CLINDAMYCIN PHOSPHATE 1 % EX SWAB: 1.0000 | Freq: Every morning | CUTANEOUS | 2 refills | Status: DC

## 2022-11-29 MED ORDER — ADAPALENE 0.3 % EX GEL: 1.0000 | Freq: Every evening | CUTANEOUS | 2 refills | Status: DC

## 2022-11-29 MED ORDER — TRIAMCINOLONE ACETONIDE 10 MG/ML IJ SUSP: 10.0000 mg | Freq: Once | INTRAMUSCULAR | Status: DC

## 2022-11-29 NOTE — Patient Instructions (Signed)
    Due to recent changes in healthcare laws, you may see results of your pathology and/or laboratory studies on MyChart before the doctors have had a chance to review them. We understand that in some cases there may be results that are confusing or concerning to you. Please understand that not all results are received at the same time and often the doctors may need to interpret multiple results in order to provide you with the best plan of care or course of treatment. Therefore, we ask that you please give us 2 business days to thoroughly review all your results before contacting the office for clarification. Should we see a critical lab result, you will be contacted sooner.   If You Need Anything After Your Visit  If you have any questions or concerns for your doctor, please call our main line at [phone] If no one answers, please leave a voicemail as directed and we will return your call as soon as possible. Messages left after 4 pm will be answered the following business day.   You may also send us a message via MyChart. We typically respond to MyChart messages within 1-2 business days.  For prescription refills, please ask your pharmacy to contact our office. Our fax number is [phone].  If you have an urgent issue when the clinic is closed that cannot wait until the next business day, you can page your doctor at the number below.    Please note that while we do our best to be available for urgent issues outside of office hours, we are not available 24/7.   If you have an urgent issue and are unable to reach us, you may choose to seek medical care at your doctor's office, retail clinic, urgent care center, or emergency room.  If you have a medical emergency, please immediately call 911 or go to the emergency department. In the event of inclement weather, please call our main line at [phone] for an update on the status of any delays or closures.  Dermatology Medication  Tips: Please keep the boxes that topical medications come in in order to help keep track of the instructions about where and how to use these. Pharmacies typically print the medication instructions only on the boxes and not directly on the medication tubes.   If your medication is too expensive, please contact our office at [phone] or send us a message through MyChart.   We are unable to tell what your co-pay for medications will be in advance as this is different depending on your insurance coverage. However, we may be able to find a substitute medication at lower cost or fill out paperwork to get insurance to cover a needed medication.   If a prior authorization is required to get your medication covered by your insurance company, please allow us 1-2 business days to complete this process.  Drug prices often vary depending on where the prescription is filled and some pharmacies may offer cheaper prices.  The website www.goodrx.com contains coupons for medications through different pharmacies. The prices here do not account for what the cost may be with help from insurance (it may be cheaper with your insurance), but the website can give you the price if you did not use any insurance.  - You can print the associated coupon and take it with your prescription to the pharmacy.  - You may also stop by our office during regular business hours and pick up a GoodRx coupon card.  -   If you need your prescription sent electronically to a different pharmacy, notify our office through [facility] MyChart or by phone at [phone]    Skin Education :   I counseled the patient regarding the following: Sun screen (SPF 30 or greater) should be applied during peak UV exposure (between 10am and 2pm) and reapplied after exercise or swimming.  The ABCDEs of melanoma were reviewed with the patient, and the importance of monthly self-examination of moles was emphasized. Should any moles change in shape or  color, or itch, bleed or burn, pt will contact our office for evaluation sooner then their interval appointment.  Plan: Sunscreen Recommendations I recommended a broad spectrum sunscreen with a SPF of 30 or higher. I explained that SPF 30 sunscreens block approximately 97 percent of the sun's harmful rays. Sunscreens should be applied at least 15 minutes prior to expected sun exposure and then every 2 hours after that as long as sun exposure continues. If swimming or exercising sunscreen should be reapplied every 45 minutes to an hour after getting wet or sweating. One ounce, or the equivalent of a shot glass full of sunscreen, is adequate to protect the skin not covered by a bathing suit. I also recommended a lip balm with a sunscreen as well. Sun protective clothing can be used in lieu of sunscreen but must be worn the entire time you are exposed to the sun's rays.  

## 2022-11-29 NOTE — Progress Notes (Signed)
   New Patient Visit   Subjective  Frances Nelson is a 67 y.o. female who presents for the following: Skin Cancer Screening and Full Body Skin Exam  The patient presents for Total-Body Skin Exam (TBSE) for skin cancer screening and mole check. The patient has spots, moles and lesions to be evaluated, some may be new or changing and the patient has concerns that these could be cancer.  She has no personal history of skin cancer. Family history is unknown regarding type.  She has a mole on the right dorsal hand x 20 years. She is concerned of reddish bumps on the face and behind ears x 5 years. She has tried acne medicines. She picks at them.    The following portions of the chart were reviewed this encounter and updated as appropriate: medications, allergies, medical history  Review of Systems:  No other skin or systemic complaints except as noted in HPI or Assessment and Plan.  Objective  Well appearing patient in no apparent distress; mood and affect are within normal limits.  A full examination was performed including scalp, head, eyes, ears, nose, lips, neck, chest, axillae, abdomen, back, buttocks, bilateral upper extremities, bilateral lower extremities, hands, feet, fingers, toes, fingernails, and toenails. All findings within normal limits unless otherwise noted below.   Relevant physical exam findings are noted in the Assessment and Plan.    Assessment & Plan   LENTIGINES, SEBORRHEIC KERATOSES, HEMANGIOMAS - Benign normal skin lesions - Benign-appearing - Call for any changes  MELANOCYTIC NEVI - Tan-brown and/or pink-flesh-colored symmetric macules and papules - Benign appearing on exam today - Observation - Call clinic for new or changing moles - Recommend daily use of broad spectrum spf 30+ sunscreen to sun-exposed areas.   ACTINIC DAMAGE - Chronic condition, secondary to cumulative UV/sun exposure - diffuse scaly erythematous macules with underlying  dyspigmentation - Recommend daily broad spectrum sunscreen SPF 30+ to sun-exposed areas, reapply every 2 hours as needed.  - Staying in the shade or wearing long sleeves, sun glasses (UVA+UVB protection) and wide brim hats (4-inch brim around the entire circumference of the hat) are also recommended for sun protection.  - Call for new or changing lesions.  PRURIGO NODULARIS Exam: Excoriated papules and nodules at the chest  Flared  Treatment Plan: ILK  Folliculitis Exam: inflamed hair follicles   Treatment Plan: Clindamycin swabs every morning Adapalene gel in the evening on Mon, Wed, and Fri   Notalgia Paraesthetica Exam:  upper back with hyperpigmented patch  Treatment Plan: Clobetasol cream 2 x daily for 2 weeks as needed       SKIN CANCER SCREENING PERFORMED TODAY.    Picker's nodule mid chest  triamcinolone acetonide (KENALOG) 10 MG/ML injection 10 mg - mid chest     Return in about 3 months (around 03/01/2023) for folliculitis.  Jaclynn Guarneri, CMA, am acting as scribe for [employer], DO.   Documentation: I have reviewed the above documentation for accuracy and completeness, and I agree with the above.  Langston Reusing, DO

## 2022-11-30 ENCOUNTER — Encounter: Payer: Self-pay | Admitting: Orthopaedic Surgery

## 2022-12-06 ENCOUNTER — Encounter: Payer: Medicare Other | Admitting: Physical Therapy

## 2022-12-08 ENCOUNTER — Encounter: Payer: Medicare Other | Admitting: Physical Therapy

## 2022-12-16 HISTORY — PX: KNEE ARTHROSCOPY W/ MENISCAL REPAIR: SHX1877

## 2022-12-21 ENCOUNTER — Other Ambulatory Visit: Payer: Self-pay | Admitting: Orthopaedic Surgery

## 2022-12-21 DIAGNOSIS — S83232A Complex tear of medial meniscus, current injury, left knee, initial encounter: Secondary | ICD-10-CM | POA: Diagnosis not present

## 2022-12-21 MED ORDER — HYDROCODONE-ACETAMINOPHEN 5-325 MG PO TABS: 1.0000 | ORAL_TABLET | Freq: Four times a day (QID) | ORAL | 0 refills | Status: DC | PRN

## 2022-12-27 ENCOUNTER — Other Ambulatory Visit: Payer: Self-pay | Admitting: Emergency Medicine

## 2022-12-28 ENCOUNTER — Encounter: Payer: Self-pay | Admitting: Orthopaedic Surgery

## 2022-12-28 ENCOUNTER — Ambulatory Visit (INDEPENDENT_AMBULATORY_CARE_PROVIDER_SITE_OTHER): Payer: Medicare Other | Admitting: Orthopaedic Surgery

## 2022-12-28 DIAGNOSIS — Z9889 Other specified postprocedural states: Secondary | ICD-10-CM

## 2022-12-28 DIAGNOSIS — S83232A Complex tear of medial meniscus, current injury, left knee, initial encounter: Secondary | ICD-10-CM

## 2022-12-28 MED ORDER — TRAMADOL HCL 50 MG PO TABS: 50.0000 mg | ORAL_TABLET | Freq: Four times a day (QID) | ORAL | 0 refills | Status: DC | PRN

## 2022-12-28 NOTE — Progress Notes (Signed)
The patient comes in status post a left knee arthroscopy a week ago.  She is 67 years old and had a significant complex tear of her medial meniscus.  Fortunately the cartilage throughout her knee looks great.  We were able to perform a partial medial meniscectomy.  She has been wearing her Ace wrap around her knee and I believe that is created swelling in her foot and ankle as well as a contact dermatitis.  I told her to stop wearing the Ace wrap.  I did remove the sutures in place Steri-Strips.  Her knee is somewhat stiff but her calf is soft.  Her motor and sensory exam is normal.  I did share with her the arthroscopy pictures of her knee.  Will have her work on conditioning her knee and strengthening the knee.  Will see her back in 4 weeks to see how she is doing overall.  I will send in some tramadol for pain as well.  All questions and concerns were answered and addressed.

## 2023-01-25 ENCOUNTER — Ambulatory Visit (HOSPITAL_COMMUNITY)
Admission: RE | Admit: 2023-01-25 | Discharge: 2023-01-25 | Disposition: A | Discharge status: Home / Self Care | Payer: Medicare Other | Source: Ambulatory Visit | Attending: Physician Assistant | Admitting: Physician Assistant

## 2023-01-25 ENCOUNTER — Encounter: Payer: Self-pay | Admitting: Physician Assistant

## 2023-01-25 ENCOUNTER — Ambulatory Visit (INDEPENDENT_AMBULATORY_CARE_PROVIDER_SITE_OTHER): Payer: Medicare Other | Admitting: Physician Assistant

## 2023-01-25 ENCOUNTER — Telehealth: Payer: Self-pay

## 2023-01-25 DIAGNOSIS — Z9889 Other specified postprocedural states: Secondary | ICD-10-CM

## 2023-01-25 DIAGNOSIS — S83232A Complex tear of medial meniscus, current injury, left knee, initial encounter: Secondary | ICD-10-CM

## 2023-01-25 NOTE — Telephone Encounter (Signed)
 Sink with [facility] wanted to let Frances Nelson know that patient is Negative for DVT, left LE.  Please advise.  Thank you.

## 2023-01-25 NOTE — Progress Notes (Signed)
HPI: Frances Nelson returns today status post left knee arthroscopy in which she underwent partial medial meniscectomy.  She was found to have a significant complex tear of the medial meniscus.  She states that she is having significant swelling from her left knee down into the foot.  She notes she has calf pain.  She does have COPD with asthma states she has been using her inhaler more often but feels that this is due to allergies.  No chest pain.  She rates her pain in her whole leg on the left to be 7-8 out of 10 pain.  Physical exam: General well-developed well-nourished female no acute distress. Respirations: Unlabored Left lower leg pitting edema.  Tenderness to the calf.  Right knee full extension full flexion./Healing well no signs of infection.  Impression: Status post left knee arthroscopy 12/21/2022  Plan: Will send her for Doppler rule out DVT.  May consider cortisone injection knee future.  If she does not have a PTM spoken with her about using compressive stocking and elevation.  Questions encouraged and answered at length.  Will see her back in 2 weeks see how she is doing overall.

## 2023-02-12 ENCOUNTER — Encounter: Payer: Self-pay | Admitting: Physician Assistant

## 2023-02-12 ENCOUNTER — Other Ambulatory Visit: Payer: Self-pay

## 2023-02-12 ENCOUNTER — Ambulatory Visit: Payer: Medicare Other | Admitting: Physician Assistant

## 2023-02-12 DIAGNOSIS — Z9889 Other specified postprocedural states: Secondary | ICD-10-CM

## 2023-02-12 DIAGNOSIS — M7660 Achilles tendinitis, unspecified leg: Secondary | ICD-10-CM

## 2023-02-12 MED ORDER — LIDOCAINE HCL 1 % IJ SOLN
3.0000 mL | INTRAMUSCULAR | Status: AC | PRN
Start: 2023-02-12 — End: 2023-02-12
  Administered 2023-02-12: 3 mL

## 2023-02-12 MED ORDER — METHYLPREDNISOLONE ACETATE 40 MG/ML IJ SUSP
40.0000 mg | INTRAMUSCULAR | Status: AC | PRN
Start: 2023-02-12 — End: 2023-02-12
  Administered 2023-02-12: 40 mg via INTRA_ARTICULAR

## 2023-02-12 NOTE — Progress Notes (Signed)
HPI: Frances Nelson returns today status post left knee arthroscopy 12/21/2022.  She still having pain about the knee.  She is also having now some left Achilles pain.  She had prior Achilles tendinitis.  Doppler of her left leg after last visit it was negative for DVT.  She is wearing compression hose and elevating the leg.  Physical exam: General well-developed well-nourished female ambulates with a cane. Left knee: Full extension full flexion no abnormal warmth erythema port sites well-healing.  Tenderness over the left knee medial joint line left calf supple nontender.   Left Achilles is intact.  Tenderness at the left Achilles maximally over the lateral insertion.  No erythema.  Impression: Status post left knee arthroscopy Left insertional Achilles tendinitis  Plan: In regards to the Achilles tendinitis discussed shoewear with her also stretching activities.  Will send her to formal physical therapy to work on range of motion and stretching they will include modalities and home exercise program.  They will also work on her left knee as far as range of motion and strengthening.  She will continue to elevate and use compression stockings.  Follow-up with [place] in 1 month sooner if there is any questions concerns.  Due to the continued pain in her knee recommend cortisone injection she was agreeable.    Procedure Note  Patient: Frances Nelson             Date of Birth: 08/10/1955           MRN: [id number]             Visit Date: 02/12/2023  Procedures: Visit Diagnoses:  1. Status post arthroscopic surgery of left knee   2. Achilles tendon pain     Large Joint Inj: L knee on 02/12/2023 11:39 AM Indications: pain Details: 22 G 1.5 in needle, anterolateral approach  Arthrogram: No  Medications: 3 mL lidocaine 1 %; 40 mg methylPREDNISolone acetate 40 MG/ML Outcome: tolerated well, no immediate complications Procedure, treatment alternatives, risks and benefits explained, specific risks  discussed. Consent was given by the patient. Immediately prior to procedure a time out was called to verify the correct patient, procedure, equipment, support staff and site/side marked as required. Patient was prepped and draped in the usual sterile fashion.

## 2023-02-16 ENCOUNTER — Encounter: Payer: Self-pay | Admitting: Rehabilitative and Restorative Service Providers"

## 2023-02-16 ENCOUNTER — Telehealth: Payer: Self-pay | Admitting: Physician Assistant

## 2023-02-16 ENCOUNTER — Other Ambulatory Visit: Payer: Self-pay

## 2023-02-16 ENCOUNTER — Ambulatory Visit (INDEPENDENT_AMBULATORY_CARE_PROVIDER_SITE_OTHER): Payer: Medicare Other | Admitting: Rehabilitative and Restorative Service Providers"

## 2023-02-16 DIAGNOSIS — R262 Difficulty in walking, not elsewhere classified: Secondary | ICD-10-CM | POA: Diagnosis not present

## 2023-02-16 DIAGNOSIS — M79662 Pain in left lower leg: Secondary | ICD-10-CM

## 2023-02-16 DIAGNOSIS — G8929 Other chronic pain: Secondary | ICD-10-CM

## 2023-02-16 DIAGNOSIS — M25562 Pain in left knee: Secondary | ICD-10-CM

## 2023-02-16 DIAGNOSIS — M6281 Muscle weakness (generalized): Secondary | ICD-10-CM | POA: Diagnosis not present

## 2023-02-16 DIAGNOSIS — R600 Localized edema: Secondary | ICD-10-CM

## 2023-02-16 NOTE — Telephone Encounter (Signed)
Patient states she was told by Bronson Curb that he was going to give her a lift for her left shoe. Patient will be here for PT on Tuesday 02/20/23 and will pick it up then.

## 2023-02-16 NOTE — Therapy (Signed)
OUTPATIENT PHYSICAL THERAPY EVALUATION   Patient Name: Frances Nelson MRN: [id number] DOB:[birth year], 67 y.o., female Today's Date: 02/16/2023  END OF SESSION:  PT End of Session - 02/16/23 1103     Visit Number 1    Number of Visits 20    Date for PT Re-Evaluation 04/27/23    Authorization Type MCR    Progress Note Due on Visit 10    PT Start Time 1108    PT Stop Time 1146    PT Time Calculation (min) 38 min    Activity Tolerance Patient limited by pain;Patient limited by fatigue    Behavior During Therapy [facility] for tasks assessed/performed             Past Medical History:  Diagnosis Date   Arthritis    Asthma    COPD (chronic obstructive pulmonary disease) (HCC)    GERD (gastroesophageal reflux disease)    Hypertension    Pneumonia    Past Surgical History:  Procedure Laterality Date   ABDOMINAL HYSTERECTOMY     APPENDECTOMY     HERNIA REPAIR     Umbilical x 3   TONSILLECTOMY     TRANSFORAMINAL LUMBAR INTERBODY FUSION (TLIF) WITH PEDICLE SCREW FIXATION 1 LEVEL N/A 05/18/2022   Procedure: Lumbar Four-Five Open Laminectomy/Transforaminal Lumbar Interbody Fusion/Posterolateral fusion;  Surgeon: Jadene Pierini, MD;  Location: [facility] OR;  Service: Neurosurgery;  Laterality: N/A;   Patient Active Problem List   Diagnosis Date Noted   Urethritis 10/05/2022   External otitis of left ear 10/05/2022   Spondylolisthesis of lumbar region 05/18/2022   Smoker 04/08/2022   Deviated nasal septum 04/08/2022   Bilateral pain of leg and foot 04/08/2022   Follicular acne 04/06/2022   Baker cyst, left 04/06/2022   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   Depression 10/04/2021   Left knee pain 10/04/2021   Chronic pain 04/09/2021   Pelvic pain 04/09/2021   HLD (hyperlipidemia) 04/09/2021   Vitamin D deficiency 04/09/2021   Estrogen deficiency 04/09/2021   Hyperglycemia 04/01/2021   Chronic cough 12/30/2020   GERD (gastroesophageal reflux  disease) 11/06/2019   Bilateral leg edema 10/16/2019   Pleuritic chest pain 10/16/2019   Hypertension 10/02/2019   Arthralgia 06/28/2018   Sinusitis 06/28/2018   Renal cyst 05/03/2018   Syncope 05/03/2018   Stress and adjustment reaction 05/03/2018   History of tobacco use 05/02/2018   Primary osteoarthritis of both hands 04/10/2018   Primary osteoarthritis of both knees 04/10/2018   Primary osteoarthritis of both feet 04/10/2018   Achilles tendinitis 04/02/2018   Screening for breast cancer 03/15/2018   Positive ANA (antinuclear antibody) 03/15/2018   Screening for colon cancer 03/15/2018   Rash 03/15/2018   Achilles tendon pain 11/13/2017   Cervical myelopathy with cervical radiculopathy (HCC) 11/13/2017   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   Allergic rhinitis 06/19/2017   Edema 12/24/2015   Carpal tunnel syndrome, right 12/16/2015   Hypercalcemia 11/11/2015   Pain in both upper extremities 11/11/2015   Drug reaction 10/28/2015   Muscle spasms of both lower extremities 10/28/2015   Nerve pain 10/28/2015   Chronic asthmatic bronchitis with acute exacerbation (HCC) 09/06/2015   Environmental and seasonal allergies 07/17/2014    PCP: Corwin Levins MD  REFERRING PROVIDER: Kathryne Hitch, MD  REFERRING DIAG: [phone] (ICD-10-CM) - Status post arthroscopic surgery of left knee M76.60 (ICD-10-CM) - Achilles tendon pain  THERAPY DIAG:  Chronic pain of left knee  Pain in left lower  leg  Muscle weakness (generalized)  Difficulty in walking, not elsewhere classified  Localized edema  Rationale for Evaluation and Treatment: Rehabilitation  ONSET DATE: Surgery 12/21/2022 on Lt knee  SUBJECTIVE:   SUBJECTIVE STATEMENT: Pt indicated having problems with "whole left leg" for years.  She indicated surgery for meniscus Lt knee in June 2024.  Recently had injection.  Unsure if injection was helpful yet (reported it was on Monday).  Pt indicated having irritation of back/ Lt  lower leg in time.  Reported worsening of Lt lower leg symptoms for years but worsened in last year.  Has had boot on in past at times.   Reported walking due to pain in Lt leg.    PERTINENT HISTORY: History of 05/2022 lumbar surgery, GERD, COPD, HTN, history of arthritis  PAIN:  NPRS scale: knee current: 6/10, at worst 8/10, lower leg current:  7-8/10, at worst 9/10 Pain location: Lt knee, Lt achilles Pain description: constant, throbbing,  Aggravating factors: sitting, prolonged WB activity, walking, bending, squatting, stairs Relieving factors: nothing specific   PRECAUTIONS: None  WEIGHT BEARING RESTRICTIONS: No  FALLS:  Has patient fallen in last 6 months? 1 fall without medical attention.   LIVING ENVIRONMENT: Lives in: House/apartment Stairs: multistory with bedroom upstairs but has moved bed downstairs.   Rail on Lt side going up.   OCCUPATION: Retired   PLOF: Independent, walking for exercise (5-6 miles/day in the past until 2012).  Dog walking   PATIENT GOALS: Reduce pain, move better, walk better without cane.   OBJECTIVE:   PATIENT SURVEYS:  02/16/2023 FOTO intake:  38  predicted:  54  COGNITION: 02/16/2023 Overall cognitive status: WFL    SENSATION: 02/16/2023 No specific check today  EDEMA:  02/16/2023 Lt leg edema noted from knee down compared to Lt.   MUSCLE LENGTH: 02/16/2023 No specific testing  POSTURE:  02/16/2023 Reduced lumbar lordosis in standing, Weight shift off Lt leg to Rt.   PALPATION: 02/16/2023 Tenderness around swollen Lt ankle, Lt knee to light touch.   LOWER EXTREMITY ROM:   ROM Right 02/16/2023 Left 02/16/2023  Hip flexion    Hip extension    Hip abduction    Hip adduction    Hip internal rotation    Hip external rotation    Knee flexion 125 92 AROM in supine heel slide c pain  Knee extension 0 0 AROM in quad set supine  Ankle dorsiflexion    Ankle plantarflexion    Ankle inversion    Ankle eversion     (Blank rows = not  tested)  LOWER EXTREMITY MMT:  MMT Right 02/16/2023 Left 02/16/2023  Hip flexion 5/5 4/5  Hip extension    Hip abduction    Hip adduction    Hip internal rotation    Hip external rotation    Knee flexion 5/5 4/5  Knee extension 4/5 3+/5  Ankle dorsiflexion 5/5 3+/5 c pain  Ankle plantarflexion  2+/5 c pain (unable to perform standing PF)  Ankle inversion 5/5 3+/5 c pain  Ankle eversion 5/5 3+/5 c pain   (Blank rows = not tested)  LOWER EXTREMITY SPECIAL TESTS:  02/16/2023 No specific testing today.   FUNCTIONAL TESTS:  02/16/2023 18 inch chair transfer: multiple tries required c noted deviation to Rt leg Lt SLS:  < 3 seconds Rt SLS: < 3 seconds  GAIT: 02/16/2023 Mod independent ambulation c SPC in Rt UE, decreased stance on Lt leg, decreased toe off progression noted.  TODAY'S TREATMENT                                                                          DATE: 02/16/2023 Therex:    HEP instruction/performance c cues for techniques, handout provided.  Trial set performed of each for comprehension and symptom assessment.  See below for exercise list  Vaso pneumatic  Lt ankle /knee combo medium compression 34 deg 10 mins  in elevation  PATIENT EDUCATION:  02/16/2023 Education details: HEP, POC Person educated: Patient Education method: Programmer, multimedia, Demonstration, Verbal cues, and Handouts Education comprehension: verbalized understanding, returned demonstration, and verbal cues required  HOME EXERCISE PROGRAM: Access Code: [email or web address] URL: [email or web address] Date: 02/16/2023 Prepared by: Chyrel Masson  Exercises - Supine Quadricep Sets  - 3-5 x daily - 7 x weekly - 1 sets - 10 reps - 5 hold - Supine Heel Slide (Mirrored)  - 3-5 x daily - 7 x weekly - 1-2 sets - 10 reps - 2 hold - Small Range  Straight Leg Raise  - 1-2 x daily - 7 x weekly - 1-2 sets - 10-15 reps - Seated Long Arc Quad (Mirrored)  - 3-5 x daily - 7 x weekly - 1-2 sets - 10 reps - 2 hold - Seated Quad Set (Mirrored)  - 3-5 x daily - 7 x weekly - 1 sets - 10 reps - 5 hold  ASSESSMENT:  CLINICAL IMPRESSION: Patient is a 67 y.o. who comes to clinic with complaints of Lt knee, Lt achilles pain with mobility, strength and movement coordination deficits that impair their ability to perform usual daily and recreational functional activities without increase difficulty/symptoms at this time.  Patient to benefit from skilled PT services to address impairments and limitations to improve to previous level of function without restriction secondary to condition.   OBJECTIVE IMPAIRMENTS: Abnormal gait, decreased activity tolerance, decreased balance, decreased coordination, decreased endurance, decreased mobility, difficulty walking, decreased ROM, decreased strength, hypomobility, increased edema, impaired perceived functional ability, impaired flexibility, improper body mechanics, postural dysfunction, and pain.   ACTIVITY LIMITATIONS: carrying, lifting, bending, sitting, standing, squatting, sleeping, stairs, transfers, bed mobility, bathing, toileting, dressing, and locomotion level  PARTICIPATION LIMITATIONS: meal prep, cleaning, laundry, interpersonal relationship, driving, shopping, and community activity  PERSONAL FACTORS:  History of 05/2022 lumbar surgery, GERD, COPD, HTN, history of arthritis, multiple treatment areas current  are also affecting patient's functional outcome.   REHAB POTENTIAL: Fair to good  CLINICAL DECISION MAKING: Evolving/moderate complexity  EVALUATION COMPLEXITY: Moderate   GOALS: Goals reviewed with patient? Yes  SHORT TERM GOALS: (target date for Short term goals are 3 weeks 03/09/2023)   1.  Patient will demonstrate independent use of home exercise program to maintain progress from in  clinic treatments.  Goal status: New  LONG TERM GOALS: (target dates for all long term goals are 10 weeks  04/27/2023 )   1. Patient will demonstrate/report pain at worst less than or equal to 2/10 to facilitate minimal limitation in daily activity secondary to pain symptoms.  Goal status: New   2. Patient will demonstrate independent use of home exercise program to facilitate ability to maintain/progress functional gains from skilled physical therapy services.  Goal status: New   3. Patient will  demonstrate FOTO outcome > or = 54 % to indicate reduced disability due to condition.  Goal status: New   4.  Patient will demonstrate Lt LE MMT 5/5 throughout to faciltiate usual transfers, stairs, squatting at [facility] for daily life.   Goal status: New   5.  Patient will demonstrate ability to ascend/descend stairs reciprocal gait pattern with single hand rail assist.  Goal status: New   6.  Patient will demonstrate Lt knee AROM 0-120 deg to facilitate ability to perform transfers, ambulation and other activity in daily life.  Goal status: New   7.  Patient will demonstrate ability to ambulation community distances > 500 ft.  Goal Status: New   PLAN:  PT FREQUENCY: 1-2x/week  PT DURATION: 10 weeks  PLANNED INTERVENTIONS: Therapeutic exercises, Therapeutic activity, Neuro Muscular re-education, Balance training, Gait training, Patient/Family education, Joint mobilization, Stair training, DME instructions, Dry Needling, Electrical stimulation, Traction, Cryotherapy, vasopneumatic deviceMoist heat, Taping, Ultrasound, Ionotophoresis 4mg /ml Dexamethasone, and aquatic therapy, Manual therapy.  All included unless contraindicated  PLAN FOR NEXT SESSION: Review HEP knowledge/results.   Control edema (vaso, supine LAQ in 90 deg hip flexion for muscle pump).  Intro strengthening/motion as tolerated.     Chyrel Masson, PT, DPT, OCS, ATC 02/16/23  12:12 PM

## 2023-02-20 ENCOUNTER — Encounter: Payer: Self-pay | Admitting: Physical Therapy

## 2023-02-20 ENCOUNTER — Ambulatory Visit: Payer: Medicare Other | Admitting: Physical Therapy

## 2023-02-20 DIAGNOSIS — R600 Localized edema: Secondary | ICD-10-CM

## 2023-02-20 DIAGNOSIS — M79662 Pain in left lower leg: Secondary | ICD-10-CM | POA: Diagnosis not present

## 2023-02-20 DIAGNOSIS — R262 Difficulty in walking, not elsewhere classified: Secondary | ICD-10-CM | POA: Diagnosis not present

## 2023-02-20 DIAGNOSIS — G8929 Other chronic pain: Secondary | ICD-10-CM

## 2023-02-20 DIAGNOSIS — M6281 Muscle weakness (generalized): Secondary | ICD-10-CM | POA: Diagnosis not present

## 2023-02-20 DIAGNOSIS — M25562 Pain in left knee: Secondary | ICD-10-CM | POA: Diagnosis not present

## 2023-02-20 NOTE — Therapy (Signed)
OUTPATIENT PHYSICAL THERAPY TREATMENT   Patient Name: Frances Nelson MRN: [id number] DOB:February 15, 1956, 67 y.o., female Today's Date: 02/20/2023  END OF SESSION:  PT End of Session - 02/20/23 1213     Visit Number 2    Number of Visits 20    Date for PT Re-Evaluation 04/27/23    Authorization Type MCR    Progress Note Due on Visit 10    PT Start Time 1155   pt late   PT Stop Time 1228    PT Time Calculation (min) 33 min    Activity Tolerance Patient limited by pain;Patient limited by fatigue    Behavior During Therapy [facility] for tasks assessed/performed              Past Medical History:  Diagnosis Date   Arthritis    Asthma    COPD (chronic obstructive pulmonary disease) (HCC)    GERD (gastroesophageal reflux disease)    Hypertension    Pneumonia    Past Surgical History:  Procedure Laterality Date   ABDOMINAL HYSTERECTOMY     APPENDECTOMY     HERNIA REPAIR     Umbilical x 3   TONSILLECTOMY     TRANSFORAMINAL LUMBAR INTERBODY FUSION (TLIF) WITH PEDICLE SCREW FIXATION 1 LEVEL N/A 05/18/2022   Procedure: Lumbar Four-Five Open Laminectomy/Transforaminal Lumbar Interbody Fusion/Posterolateral fusion;  Surgeon: Jadene Pierini, MD;  Location: [facility] OR;  Service: Neurosurgery;  Laterality: N/A;   Patient Active Problem List   Diagnosis Date Noted   Urethritis 10/05/2022   External otitis of left ear 10/05/2022   Spondylolisthesis of lumbar region 05/18/2022   Smoker 04/08/2022   Deviated nasal septum 04/08/2022   Bilateral pain of leg and foot 04/08/2022   Follicular acne 04/06/2022   Baker cyst, left 04/06/2022   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   Depression 10/04/2021   Left knee pain 10/04/2021   Chronic pain 04/09/2021   Pelvic pain 04/09/2021   HLD (hyperlipidemia) 04/09/2021   Vitamin D deficiency 04/09/2021   Estrogen deficiency 04/09/2021   Hyperglycemia 04/01/2021   Chronic cough 12/30/2020   GERD  (gastroesophageal reflux disease) 11/06/2019   Bilateral leg edema 10/16/2019   Pleuritic chest pain 10/16/2019   Hypertension 10/02/2019   Arthralgia 06/28/2018   Sinusitis 06/28/2018   Renal cyst 05/03/2018   Syncope 05/03/2018   Stress and adjustment reaction 05/03/2018   History of tobacco use 05/02/2018   Primary osteoarthritis of both hands 04/10/2018   Primary osteoarthritis of both knees 04/10/2018   Primary osteoarthritis of both feet 04/10/2018   Achilles tendinitis 04/02/2018   Screening for breast cancer 03/15/2018   Positive ANA (antinuclear antibody) 03/15/2018   Screening for colon cancer 03/15/2018   Rash 03/15/2018   Achilles tendon pain 11/13/2017   Cervical myelopathy with cervical radiculopathy (HCC) 11/13/2017   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   Allergic rhinitis 06/19/2017   Edema 12/24/2015   Carpal tunnel syndrome, right 12/16/2015   Hypercalcemia 11/11/2015   Pain in both upper extremities 11/11/2015   Drug reaction 10/28/2015   Muscle spasms of both lower extremities 10/28/2015   Nerve pain 10/28/2015   Chronic asthmatic bronchitis with acute exacerbation (HCC) 09/06/2015   Environmental and seasonal allergies 07/17/2014    PCP: Corwin Levins MD  REFERRING PROVIDER: Kathryne Hitch, MD  REFERRING DIAG: [phone] (ICD-10-CM) - Status post arthroscopic surgery of left knee M76.60 (ICD-10-CM) - Achilles tendon pain  THERAPY DIAG:  Chronic pain of left knee  Pain in left lower leg  Muscle weakness (generalized)  Difficulty in walking, not elsewhere classified  Localized edema  Rationale for Evaluation and Treatment: Rehabilitation  ONSET DATE: Surgery 12/21/2022 on Lt knee  SUBJECTIVE:   SUBJECTIVE STATEMENT:  Sorry I'm late, there was an issue with the door downstairs and I couldn't get in. Nothing new since last PT session. Doing OK with HEP, able to get to it regularly. Leg has had a hard time, nothing seems to touch it.    PERTINENT HISTORY: History of 05/2022 lumbar surgery, GERD, COPD, HTN, history of arthritis  PAIN:  NPRS scale: knee 6-7/10, achilles "up there as well", 7/10 Pain location: Lt knee, Lt achilles Pain description: constant, throbbing,  Aggravating factors: sitting, prolonged WB activity, walking, bending, squatting, stairs Relieving factors: nothing specific   PRECAUTIONS: None  WEIGHT BEARING RESTRICTIONS: No  FALLS:  Has patient fallen in last 6 months? 1 fall without medical attention.   LIVING ENVIRONMENT: Lives in: House/apartment Stairs: multistory with bedroom upstairs but has moved bed downstairs.   Rail on Lt side going up.   OCCUPATION: Retired   PLOF: Independent, walking for exercise (5-6 miles/day in the past until 2012).  Dog walking   PATIENT GOALS: Reduce pain, move better, walk better without cane.   OBJECTIVE:   PATIENT SURVEYS:  02/16/2023 FOTO intake:  38  predicted:  54  COGNITION: 02/16/2023 Overall cognitive status: WFL    SENSATION: 02/16/2023 No specific check today  EDEMA:  02/16/2023 Lt leg edema noted from knee down compared to Lt.   MUSCLE LENGTH: 02/16/2023 No specific testing  POSTURE:  02/16/2023 Reduced lumbar lordosis in standing, Weight shift off Lt leg to Rt.   PALPATION: 02/16/2023 Tenderness around swollen Lt ankle, Lt knee to light touch.   LOWER EXTREMITY ROM:   ROM Right 02/16/2023 Left 02/16/2023  Hip flexion    Hip extension    Hip abduction    Hip adduction    Hip internal rotation    Hip external rotation    Knee flexion 125 92 AROM in supine heel slide c pain  Knee extension 0 0 AROM in quad set supine  Ankle dorsiflexion    Ankle plantarflexion    Ankle inversion    Ankle eversion     (Blank rows = not tested)  LOWER EXTREMITY MMT:  MMT Right 02/16/2023 Left 02/16/2023  Hip flexion 5/5 4/5  Hip extension    Hip abduction    Hip adduction    Hip internal rotation    Hip external rotation    Knee flexion  5/5 4/5  Knee extension 4/5 3+/5  Ankle dorsiflexion 5/5 3+/5 c pain  Ankle plantarflexion  2+/5 c pain (unable to perform standing PF)  Ankle inversion 5/5 3+/5 c pain  Ankle eversion 5/5 3+/5 c pain   (Blank rows = not tested)  LOWER EXTREMITY SPECIAL TESTS:  02/16/2023 No specific testing today.   FUNCTIONAL TESTS:  02/16/2023 18 inch chair transfer: multiple tries required c noted deviation to Rt leg Lt SLS:  < 3 seconds Rt SLS: < 3 seconds  GAIT: 02/16/2023 Mod independent ambulation c SPC in Rt UE, decreased stance on Lt leg, decreased toe off progression noted.  TODAY'S TREATMENT                                                                          DATE:    02/20/23  TherEx  Ankle pumps with LEs elevated x12 SAQs x12 3# SLRs 0# x12 Supine hip ABD 0# x12 min guard from PT    Orthostatics   Supine 127/79 HR 74 Sitting 114/81 HR 82 Standing 123/78 HR 79 Standing x2 minutes 131/79 HR 78  Education provided while taking orthostatics on BP readings, formal orthostatic dx vs small BP changes with position changes, encouraged taking time with transitions   Manual  Retrograde edema massage with LEs elevated    02/16/2023 Therex:    HEP instruction/performance c cues for techniques, handout provided.  Trial set performed of each for comprehension and symptom assessment.  See below for exercise list  Vaso pneumatic  Lt ankle /knee combo medium compression 34 deg 10 mins  in elevation  PATIENT EDUCATION:  02/16/2023 Education details: HEP, POC Person educated: Patient Education method: Programmer, multimedia, Demonstration, Verbal cues, and Handouts Education comprehension: verbalized understanding, returned demonstration, and verbal cues required  HOME EXERCISE PROGRAM: Access Code: [email or web address] URL:  [email or web address] Date: 02/16/2023 Prepared by: Chyrel Masson  Exercises - Supine Quadricep Sets  - 3-5 x daily - 7 x weekly - 1 sets - 10 reps - 5 hold - Supine Heel Slide (Mirrored)  - 3-5 x daily - 7 x weekly - 1-2 sets - 10 reps - 2 hold - Small Range Straight Leg Raise  - 1-2 x daily - 7 x weekly - 1-2 sets - 10-15 reps - Seated Long Arc Quad (Mirrored)  - 3-5 x daily - 7 x weekly - 1-2 sets - 10 reps - 2 hold - Seated Quad Set (Mirrored)  - 3-5 x daily - 7 x weekly - 1 sets - 10 reps - 5 hold  ASSESSMENT:  CLINICAL IMPRESSION:  Andrey Campanile arrives slightly late, we spent some time working on edema control strategies and otherwise on functional movements and strengthening as tolerated. Did well, still fairly pain limited. Will continue to progress as able and tolerated. She did become really dizzy with transition from supine to sit, reports she has a lot of dizziness with position changes and almost falls due to the same. Orthostatics as above.    OBJECTIVE IMPAIRMENTS: Abnormal gait, decreased activity tolerance, decreased balance, decreased coordination, decreased endurance, decreased mobility, difficulty walking, decreased ROM, decreased strength, hypomobility, increased edema, impaired perceived functional ability, impaired flexibility, improper body mechanics, postural dysfunction, and pain.   ACTIVITY LIMITATIONS: carrying, lifting, bending, sitting, standing, squatting, sleeping, stairs, transfers, bed mobility, bathing, toileting, dressing, and locomotion level  PARTICIPATION LIMITATIONS: meal prep, cleaning, laundry, interpersonal relationship, driving, shopping, and community activity  PERSONAL FACTORS:  History of 05/2022 lumbar surgery, GERD, COPD, HTN, history of arthritis, multiple treatment areas current  are also affecting patient's functional outcome.   REHAB POTENTIAL: Fair to good  CLINICAL DECISION MAKING: Evolving/moderate complexity  EVALUATION  COMPLEXITY: Moderate   GOALS: Goals reviewed with patient? Yes  SHORT TERM GOALS: (target date for Short term goals are 3 weeks 03/09/2023)   1.  Patient will demonstrate independent use of home exercise program  to maintain progress from in clinic treatments.  Goal status: New  LONG TERM GOALS: (target dates for all long term goals are 10 weeks  04/27/2023 )   1. Patient will demonstrate/report pain at worst less than or equal to 2/10 to facilitate minimal limitation in daily activity secondary to pain symptoms.  Goal status: New   2. Patient will demonstrate independent use of home exercise program to facilitate ability to maintain/progress functional gains from skilled physical therapy services.  Goal status: New   3. Patient will demonstrate FOTO outcome > or = 54 % to indicate reduced disability due to condition.  Goal status: New   4.  Patient will demonstrate Lt LE MMT 5/5 throughout to faciltiate usual transfers, stairs, squatting at [facility] for daily life.   Goal status: New   5.  Patient will demonstrate ability to ascend/descend stairs reciprocal gait pattern with single hand rail assist.  Goal status: New   6.  Patient will demonstrate Lt knee AROM 0-120 deg to facilitate ability to perform transfers, ambulation and other activity in daily life.  Goal status: New   7.  Patient will demonstrate ability to ambulation community distances > 500 ft.  Goal Status: New   PLAN:  PT FREQUENCY: 1-2x/week  PT DURATION: 10 weeks  PLANNED INTERVENTIONS: Therapeutic exercises, Therapeutic activity, Neuro Muscular re-education, Balance training, Gait training, Patient/Family education, Joint mobilization, Stair training, DME instructions, Dry Needling, Electrical stimulation, Traction, Cryotherapy, vasopneumatic deviceMoist heat, Taping, Ultrasound, Ionotophoresis 4mg /ml Dexamethasone, and aquatic therapy, Manual therapy.  All included unless contraindicated  PLAN FOR NEXT  SESSION: Review HEP knowledge/results.   Control edema (vaso, supine LAQ in 90 deg hip flexion for muscle pump).  Intro strengthening/motion as tolerated.     Nedra Hai, PT, DPT 02/20/23 12:31 PM

## 2023-02-21 ENCOUNTER — Ambulatory Visit (INDEPENDENT_AMBULATORY_CARE_PROVIDER_SITE_OTHER): Payer: Medicare Other | Admitting: Rehabilitative and Restorative Service Providers"

## 2023-02-21 ENCOUNTER — Encounter: Payer: Self-pay | Admitting: Rehabilitative and Restorative Service Providers"

## 2023-02-21 DIAGNOSIS — M79662 Pain in left lower leg: Secondary | ICD-10-CM

## 2023-02-21 DIAGNOSIS — R262 Difficulty in walking, not elsewhere classified: Secondary | ICD-10-CM

## 2023-02-21 DIAGNOSIS — M6281 Muscle weakness (generalized): Secondary | ICD-10-CM | POA: Diagnosis not present

## 2023-02-21 DIAGNOSIS — R600 Localized edema: Secondary | ICD-10-CM

## 2023-02-21 DIAGNOSIS — G8929 Other chronic pain: Secondary | ICD-10-CM

## 2023-02-21 DIAGNOSIS — M25562 Pain in left knee: Secondary | ICD-10-CM

## 2023-02-21 NOTE — Therapy (Signed)
OUTPATIENT PHYSICAL THERAPY TREATMENT   Patient Name: Frances Nelson MRN: [id number] DOB:[birth year], 68 y.o., female Today's Date: 02/21/2023  END OF SESSION:  PT End of Session - 02/21/23 1258     Visit Number 3    Number of Visits 20    Date for PT Re-Evaluation 04/27/23    Authorization Type MCR    Progress Note Due on Visit 10    PT Start Time 1258    PT Stop Time 1347    PT Time Calculation (min) 49 min    Activity Tolerance Patient limited by pain    Behavior During Therapy WFL for tasks assessed/performed               Past Medical History:  Diagnosis Date   Arthritis    Asthma    COPD (chronic obstructive pulmonary disease) (HCC)    GERD (gastroesophageal reflux disease)    Hypertension    Pneumonia    Past Surgical History:  Procedure Laterality Date   ABDOMINAL HYSTERECTOMY     APPENDECTOMY     HERNIA REPAIR     Umbilical x 3   TONSILLECTOMY     TRANSFORAMINAL LUMBAR INTERBODY FUSION (TLIF) WITH PEDICLE SCREW FIXATION 1 LEVEL N/A 05/18/2022   Procedure: Lumbar Four-Five Open Laminectomy/Transforaminal Lumbar Interbody Fusion/Posterolateral fusion;  Surgeon: Jadene Pierini, MD;  Location: [facility] OR;  Service: Neurosurgery;  Laterality: N/A;   Patient Active Problem List   Diagnosis Date Noted   Urethritis 10/05/2022   External otitis of left ear 10/05/2022   Spondylolisthesis of lumbar region 05/18/2022   Smoker 04/08/2022   Deviated nasal septum 04/08/2022   Bilateral pain of leg and foot 04/08/2022   Follicular acne 04/06/2022   Baker cyst, left 04/06/2022   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   Depression 10/04/2021   Left knee pain 10/04/2021   Chronic pain 04/09/2021   Pelvic pain 04/09/2021   HLD (hyperlipidemia) 04/09/2021   Vitamin D deficiency 04/09/2021   Estrogen deficiency 04/09/2021   Hyperglycemia 04/01/2021   Chronic cough 12/30/2020   GERD (gastroesophageal reflux disease) 11/06/2019    Bilateral leg edema 10/16/2019   Pleuritic chest pain 10/16/2019   Hypertension 10/02/2019   Arthralgia 06/28/2018   Sinusitis 06/28/2018   Renal cyst 05/03/2018   Syncope 05/03/2018   Stress and adjustment reaction 05/03/2018   History of tobacco use 05/02/2018   Primary osteoarthritis of both hands 04/10/2018   Primary osteoarthritis of both knees 04/10/2018   Primary osteoarthritis of both feet 04/10/2018   Achilles tendinitis 04/02/2018   Screening for breast cancer 03/15/2018   Positive ANA (antinuclear antibody) 03/15/2018   Screening for colon cancer 03/15/2018   Rash 03/15/2018   Achilles tendon pain 11/13/2017   Cervical myelopathy with cervical radiculopathy (HCC) 11/13/2017   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   Allergic rhinitis 06/19/2017   Edema 12/24/2015   Carpal tunnel syndrome, right 12/16/2015   Hypercalcemia 11/11/2015   Pain in both upper extremities 11/11/2015   Drug reaction 10/28/2015   Muscle spasms of both lower extremities 10/28/2015   Nerve pain 10/28/2015   Chronic asthmatic bronchitis with acute exacerbation (HCC) 09/06/2015   Environmental and seasonal allergies 07/17/2014    PCP: Corwin Levins MD  REFERRING PROVIDER: Kathryne Hitch, MD  REFERRING DIAG: [phone] (ICD-10-CM) - Status post arthroscopic surgery of left knee M76.60 (ICD-10-CM) - Achilles tendon pain  THERAPY DIAG:  Chronic pain of left knee  Pain in left lower leg  Muscle weakness (generalized)  Difficulty in walking, not elsewhere classified  Localized edema  Rationale for Evaluation and Treatment: Rehabilitation  ONSET DATE: Surgery 12/21/2022 on Lt knee  SUBJECTIVE:   SUBJECTIVE STATEMENT: Pt indicated having complaints with stiffness with knee and ankle with sitting.  Reported having more pain noted in ankle/leg last night after visit.  She didn't have any specific item yesterday  PERTINENT HISTORY: History of 05/2022 lumbar surgery, GERD, COPD, HTN, history  of arthritis  PAIN:  NPRS scale: current knee 6/10, achilles 7.5/10 Pain location: Lt knee, Lt achilles Pain description: constant, throbbing,  Aggravating factors: sitting, prolonged WB activity, walking, bending, squatting, stairs Relieving factors: nothing specific   PRECAUTIONS: None  WEIGHT BEARING RESTRICTIONS: No  FALLS:  Has patient fallen in last 6 months? 1 fall without medical attention.   LIVING ENVIRONMENT: Lives in: House/apartment Stairs: multistory with bedroom upstairs but has moved bed downstairs.   Rail on Lt side going up.   OCCUPATION: Retired   PLOF: Independent, walking for exercise (5-6 miles/day in the past until 2012).  Dog walking   PATIENT GOALS: Reduce pain, move better, walk better without cane.   OBJECTIVE:   PATIENT SURVEYS:  02/16/2023 FOTO intake:  38  predicted:  54  COGNITION: 02/16/2023 Overall cognitive status: WFL    SENSATION: 02/16/2023 No specific check today  EDEMA:  02/16/2023 Lt leg edema noted from knee down compared to Lt.   MUSCLE LENGTH: 02/16/2023 No specific testing  POSTURE:  02/16/2023 Reduced lumbar lordosis in standing, Weight shift off Lt leg to Rt.   PALPATION: 02/16/2023 Tenderness around swollen Lt ankle, Lt knee to light touch.   LOWER EXTREMITY ROM:   ROM Right 02/16/2023 Left 02/16/2023 Left 02/21/2023  Hip flexion     Hip extension     Hip abduction     Hip adduction     Hip internal rotation     Hip external rotation     Knee flexion 125 92 AROM in supine heel slide c pain PROM 95/100 grossly assesed in sitting.   Knee extension 0 0 AROM in quad set supine   Ankle dorsiflexion     Ankle plantarflexion     Ankle inversion     Ankle eversion      (Blank rows = not tested)  LOWER EXTREMITY MMT:  MMT Right 02/16/2023 Left 02/16/2023  Hip flexion 5/5 4/5  Hip extension    Hip abduction    Hip adduction    Hip internal rotation    Hip external rotation    Knee flexion 5/5 4/5  Knee extension 4/5  3+/5  Ankle dorsiflexion 5/5 3+/5 c pain  Ankle plantarflexion  2+/5 c pain (unable to perform standing PF)  Ankle inversion 5/5 3+/5 c pain  Ankle eversion 5/5 3+/5 c pain   (Blank rows = not tested)  LOWER EXTREMITY SPECIAL TESTS:  02/16/2023 No specific testing today.   FUNCTIONAL TESTS:  02/16/2023 18 inch chair transfer: multiple tries required c noted deviation to Rt leg Lt SLS:  < 3 seconds Rt SLS: < 3 seconds  GAIT: 02/16/2023 Mod independent ambulation c SPC in Rt UE, decreased stance on Lt leg, decreased toe off progression noted.  TODAY'S TREATMENT                                                                          DATE: 02/21/2023 Therex: Nustep lvl 5 8 mins UE/LE  Seated Lt knee alternating sub max/no pain isometric holds 5 sec for extension and flexion x 12 each way Supine elevated Lt gastroc stretch c strap 30 sec x 3 Lt leg PF green band with eccentric return focus 2 x 10   Manual: Seated Lt knee flexion c distraction/IR mobilization with movement.  Lt ankle overpressure DF stretch in sitting.   Vaso pneumatic  Lt ankle /knee combo medium compression 34 deg 10 mins  in elevation   TODAY'S TREATMENT                                                                          DATE: 02/20/23 TherEx Ankle pumps with LEs elevated x12 SAQs x12 3# SLRs 0# x12 Supine hip ABD 0# x12 min guard from PT    Orthostatics  Supine 127/79 HR 74 Sitting 114/81 HR 82 Standing 123/78 HR 79 Standing x2 minutes 131/79 HR 78  Education provided while taking orthostatics on BP readings, formal orthostatic dx vs small BP changes with position changes, encouraged taking time with transitions   Manual Retrograde edema massage with LEs elevated    TODAY'S TREATMENT                                                                           DATE: 02/16/2023 Therex:    HEP instruction/performance c cues for techniques, handout provided.  Trial set performed of each for comprehension and symptom assessment.  See below for exercise list  Vaso pneumatic  Lt ankle /knee combo medium compression 34 deg 10 mins  in elevation  PATIENT EDUCATION:  02/21/2023  Education details: HEP update Person educated: Patient Education method: Programmer, multimedia, Demonstration, Verbal cues, and Handouts Education comprehension: verbalized understanding, returned demonstration, and verbal cues required  HOME EXERCISE PROGRAM: Access Code: [email or web address] URL: [email or web address] Date: 02/21/2023 Prepared by: Chyrel Masson  Exercises - Supine Quadricep Sets  - 3-5 x daily - 7 x weekly - 1 sets - 10 reps - 5 hold - Supine Heel Slide (Mirrored)  - 3-5 x daily - 7 x weekly - 1-2 sets - 10 reps - 2 hold - Small Range Straight Leg Raise  - 1-2 x daily - 7 x weekly - 1-2 sets - 10-15 reps - Seated Long Arc Quad (Mirrored)  - 3-5 x daily - 7 x weekly - 1-2 sets - 10 reps - 2 hold - Seated Quad Set (Mirrored)  - 3-5 x daily - 7 x  weekly - 1 sets - 10 reps - 5 hold - Seated Gastroc Stretch with Strap (Mirrored)  - 2-3 x daily - 7 x weekly - 1 sets - 3 reps - 30 hold - Long Sitting Eccentric Ankle Plantar Flexion with Resistance  - 1-2 x daily - 7 x weekly - 3 sets - 10 reps  ASSESSMENT:  CLINICAL IMPRESSION: Edema continued as noted and impacting mobility but some mild gains noted in both ankle and knee qualitatively.  Pt to benefit from continued mobility improvements and strengthening progression as able to promote improved functional use of Lt leg.  Continued skilled PT services indicated at this time.    OBJECTIVE IMPAIRMENTS: Abnormal gait, decreased activity tolerance, decreased balance, decreased coordination, decreased endurance, decreased mobility, difficulty walking, decreased ROM, decreased strength, hypomobility,  increased edema, impaired perceived functional ability, impaired flexibility, improper body mechanics, postural dysfunction, and pain.   ACTIVITY LIMITATIONS: carrying, lifting, bending, sitting, standing, squatting, sleeping, stairs, transfers, bed mobility, bathing, toileting, dressing, and locomotion level  PARTICIPATION LIMITATIONS: meal prep, cleaning, laundry, interpersonal relationship, driving, shopping, and community activity  PERSONAL FACTORS:  History of 05/2022 lumbar surgery, GERD, COPD, HTN, history of arthritis, multiple treatment areas current  are also affecting patient's functional outcome.   REHAB POTENTIAL: Fair to good  CLINICAL DECISION MAKING: Evolving/moderate complexity  EVALUATION COMPLEXITY: Moderate   GOALS: Goals reviewed with patient? Yes  SHORT TERM GOALS: (target date for Short term goals are 3 weeks 03/09/2023)   1.  Patient will demonstrate independent use of home exercise program to maintain progress from in clinic treatments.  Goal status: New  LONG TERM GOALS: (target dates for all long term goals are 10 weeks  04/27/2023 )   1. Patient will demonstrate/report pain at worst less than or equal to 2/10 to facilitate minimal limitation in daily activity secondary to pain symptoms.  Goal status: New   2. Patient will demonstrate independent use of home exercise program to facilitate ability to maintain/progress functional gains from skilled physical therapy services.  Goal status: New   3. Patient will demonstrate FOTO outcome > or = 54 % to indicate reduced disability due to condition.  Goal status: New   4.  Patient will demonstrate Lt LE MMT 5/5 throughout to faciltiate usual transfers, stairs, squatting at [facility] for daily life.   Goal status: New   5.  Patient will demonstrate ability to ascend/descend stairs reciprocal gait pattern with single hand rail assist.  Goal status: New   6.  Patient will demonstrate Lt knee AROM 0-120 deg to  facilitate ability to perform transfers, ambulation and other activity in daily life.  Goal status: New   7.  Patient will demonstrate ability to ambulation community distances > 500 ft.  Goal Status: New   PLAN:  PT FREQUENCY: 1-2x/week  PT DURATION: 10 weeks  PLANNED INTERVENTIONS: Therapeutic exercises, Therapeutic activity, Neuro Muscular re-education, Balance training, Gait training, Patient/Family education, Joint mobilization, Stair training, DME instructions, Dry Needling, Electrical stimulation, Traction, Cryotherapy, vasopneumatic deviceMoist heat, Taping, Ultrasound, Ionotophoresis 4mg /ml Dexamethasone, and aquatic therapy, Manual therapy.  All included unless contraindicated  PLAN FOR NEXT SESSION: VASO ankle/knee for edema.  Progressive strengthening as tolerated for ankle/knee.    Chyrel Masson, PT, DPT, OCS, ATC 02/21/23  1:53 PM

## 2023-02-21 NOTE — Telephone Encounter (Signed)
Patient aware I will put them upstairs for her to grab when she comes for her PT appt

## 2023-02-27 ENCOUNTER — Ambulatory Visit (INDEPENDENT_AMBULATORY_CARE_PROVIDER_SITE_OTHER): Payer: Medicare Other | Admitting: Physical Therapy

## 2023-02-27 ENCOUNTER — Encounter: Payer: Self-pay | Admitting: Physical Therapy

## 2023-02-27 DIAGNOSIS — M79662 Pain in left lower leg: Secondary | ICD-10-CM | POA: Diagnosis not present

## 2023-02-27 DIAGNOSIS — R262 Difficulty in walking, not elsewhere classified: Secondary | ICD-10-CM

## 2023-02-27 DIAGNOSIS — G8929 Other chronic pain: Secondary | ICD-10-CM

## 2023-02-27 DIAGNOSIS — M25562 Pain in left knee: Secondary | ICD-10-CM | POA: Diagnosis not present

## 2023-02-27 DIAGNOSIS — M6281 Muscle weakness (generalized): Secondary | ICD-10-CM

## 2023-02-27 DIAGNOSIS — R600 Localized edema: Secondary | ICD-10-CM

## 2023-02-27 NOTE — Therapy (Signed)
OUTPATIENT PHYSICAL THERAPY TREATMENT   Patient Name: Frances Nelson MRN: [id number] DOB:21-Apr-1956, 67 y.o., female Today's Date: 02/27/2023  END OF SESSION:  PT End of Session - 02/27/23 1521     Visit Number 4    Number of Visits 20    Date for PT Re-Evaluation 04/27/23    Authorization Type MCR    Progress Note Due on Visit 10    PT Start Time 1522    PT Stop Time 1612    PT Time Calculation (min) 50 min    Activity Tolerance Patient limited by pain;No increased pain    Behavior During Therapy WFL for tasks assessed/performed                Past Medical History:  Diagnosis Date   Arthritis    Asthma    COPD (chronic obstructive pulmonary disease) (HCC)    GERD (gastroesophageal reflux disease)    Hypertension    Pneumonia    Past Surgical History:  Procedure Laterality Date   ABDOMINAL HYSTERECTOMY     APPENDECTOMY     HERNIA REPAIR     Umbilical x 3   TONSILLECTOMY     TRANSFORAMINAL LUMBAR INTERBODY FUSION (TLIF) WITH PEDICLE SCREW FIXATION 1 LEVEL N/A 05/18/2022   Procedure: Lumbar Four-Five Open Laminectomy/Transforaminal Lumbar Interbody Fusion/Posterolateral fusion;  Surgeon: Jadene Pierini, MD;  Location: [facility] OR;  Service: Neurosurgery;  Laterality: N/A;   Patient Active Problem List   Diagnosis Date Noted   Urethritis 10/05/2022   External otitis of left ear 10/05/2022   Spondylolisthesis of lumbar region 05/18/2022   Smoker 04/08/2022   Deviated nasal septum 04/08/2022   Bilateral pain of leg and foot 04/08/2022   Follicular acne 04/06/2022   Baker cyst, left 04/06/2022   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   Depression 10/04/2021   Left knee pain 10/04/2021   Chronic pain 04/09/2021   Pelvic pain 04/09/2021   HLD (hyperlipidemia) 04/09/2021   Vitamin D deficiency 04/09/2021   Estrogen deficiency 04/09/2021   Hyperglycemia 04/01/2021   Chronic cough 12/30/2020   GERD (gastroesophageal reflux  disease) 11/06/2019   Bilateral leg edema 10/16/2019   Pleuritic chest pain 10/16/2019   Hypertension 10/02/2019   Arthralgia 06/28/2018   Sinusitis 06/28/2018   Renal cyst 05/03/2018   Syncope 05/03/2018   Stress and adjustment reaction 05/03/2018   History of tobacco use 05/02/2018   Primary osteoarthritis of both hands 04/10/2018   Primary osteoarthritis of both knees 04/10/2018   Primary osteoarthritis of both feet 04/10/2018   Achilles tendinitis 04/02/2018   Screening for breast cancer 03/15/2018   Positive ANA (antinuclear antibody) 03/15/2018   Screening for colon cancer 03/15/2018   Rash 03/15/2018   Achilles tendon pain 11/13/2017   Cervical myelopathy with cervical radiculopathy (HCC) 11/13/2017   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   Allergic rhinitis 06/19/2017   Edema 12/24/2015   Carpal tunnel syndrome, right 12/16/2015   Hypercalcemia 11/11/2015   Pain in both upper extremities 11/11/2015   Drug reaction 10/28/2015   Muscle spasms of both lower extremities 10/28/2015   Nerve pain 10/28/2015   Chronic asthmatic bronchitis with acute exacerbation (HCC) 09/06/2015   Environmental and seasonal allergies 07/17/2014    PCP: Corwin Levins MD  REFERRING PROVIDER: Kathryne Hitch, MD  REFERRING DIAG: [phone] (ICD-10-CM) - Status post arthroscopic surgery of left knee M76.60 (ICD-10-CM) - Achilles tendon pain  THERAPY DIAG:  Chronic pain of left knee  Muscle weakness (  generalized)  Pain in left lower leg  Difficulty in walking, not elsewhere classified  Localized edema  Rationale for Evaluation and Treatment: Rehabilitation  ONSET DATE: Surgery 12/21/2022 on Lt knee  SUBJECTIVE:   SUBJECTIVE STATEMENT: Having a fair bit of soreness, notes a few days of extra pain after last session. 8/10 at present. Has been doing some of her HEP but limited due to pain  PERTINENT HISTORY: History of 05/2022 lumbar surgery, GERD, COPD, HTN, history of  arthritis  PAIN:  NPRS scale: 8/10 knee distally Pain location: Lt knee, Lt achilles Pain description: constant, throbbing,  Aggravating factors: sitting, prolonged WB activity, walking, bending, squatting, stairs Relieving factors: nothing specific   PRECAUTIONS: None  WEIGHT BEARING RESTRICTIONS: No  FALLS:  Has patient fallen in last 6 months? 1 fall without medical attention.   LIVING ENVIRONMENT: Lives in: House/apartment Stairs: multistory with bedroom upstairs but has moved bed downstairs.   Rail on Lt side going up.   OCCUPATION: Retired   PLOF: Independent, walking for exercise (5-6 miles/day in the past until 2012).  Dog walking   PATIENT GOALS: Reduce pain, move better, walk better without cane.   OBJECTIVE: (objective measures completed at initial evaluation unless otherwise dated)   PATIENT SURVEYS:  02/16/2023 FOTO intake:  38  predicted:  54  COGNITION: 02/16/2023 Overall cognitive status: WFL    SENSATION: 02/16/2023 No specific check today  EDEMA:  02/16/2023 Lt leg edema noted from knee down compared to Lt.   MUSCLE LENGTH: 02/16/2023 No specific testing  POSTURE:  02/16/2023 Reduced lumbar lordosis in standing, Weight shift off Lt leg to Rt.   PALPATION: 02/16/2023 Tenderness around swollen Lt ankle, Lt knee to light touch.   LOWER EXTREMITY ROM:   ROM Right 02/16/2023 Left 02/16/2023 Left 02/21/2023  Hip flexion     Hip extension     Hip abduction     Hip adduction     Hip internal rotation     Hip external rotation     Knee flexion 125 92 AROM in supine heel slide c pain PROM 95/100 grossly assesed in sitting.   Knee extension 0 0 AROM in quad set supine   Ankle dorsiflexion     Ankle plantarflexion     Ankle inversion     Ankle eversion      (Blank rows = not tested)  LOWER EXTREMITY MMT:  MMT Right 02/16/2023 Left 02/16/2023  Hip flexion 5/5 4/5  Hip extension    Hip abduction    Hip adduction    Hip internal rotation    Hip  external rotation    Knee flexion 5/5 4/5  Knee extension 4/5 3+/5  Ankle dorsiflexion 5/5 3+/5 c pain  Ankle plantarflexion  2+/5 c pain (unable to perform standing PF)  Ankle inversion 5/5 3+/5 c pain  Ankle eversion 5/5 3+/5 c pain   (Blank rows = not tested)  LOWER EXTREMITY SPECIAL TESTS:  02/16/2023 No specific testing today.   FUNCTIONAL TESTS:  02/16/2023 18 inch chair transfer: multiple tries required c noted deviation to Rt leg Lt SLS:  < 3 seconds Rt SLS: < 3 seconds  GAIT: 02/16/2023 Mod independent ambulation c SPC in Rt UE, decreased stance on Lt leg, decreased toe off progression noted.  TODAY'S TREATMENT                                                                           [facility] Adult PT Treatment:                                                DATE: 02/27/23 Therapeutic Exercise: Quad set with heel prop x10 cues for quad contraction Seated march 2x10 cues for breath control and trunk mechanics Seated quad set x10 LLE only cues for quad contraction Seated heel/toe raises x10 cues for comfortable ROM Seated gastroc stretch 3x30sec with strap cues for breath control    Manual Therapy: Supine knee flex/ext LLE to pt tolerance, limited by pain rather than stiffness, better tolerance noted to extension compared to extension  Modalities: Vaso L knee/ankle combo, medium compression 34 deg , good relief with no adverse events; supine w/ LE elevation   DATE: 02/21/2023 Therex: Nustep lvl 5 8 mins UE/LE  Seated Lt knee alternating sub max/no pain isometric holds 5 sec for extension and flexion x 12 each way Supine elevated Lt gastroc stretch c strap 30 sec x 3 Lt leg PF green band with eccentric return focus 2 x 10   Manual: Seated Lt knee flexion c distraction/IR mobilization with movement.  Lt  ankle overpressure DF stretch in sitting.   Vaso pneumatic  Lt ankle /knee combo medium compression 34 deg 10 mins  in elevation   TODAY'S TREATMENT                                                                          DATE: 02/20/23 TherEx Ankle pumps with LEs elevated x12 SAQs x12 3# SLRs 0# x12 Supine hip ABD 0# x12 min guard from PT    Orthostatics  Supine 127/79 HR 74 Sitting 114/81 HR 82 Standing 123/78 HR 79 Standing x2 minutes 131/79 HR 78  Education provided while taking orthostatics on BP readings, formal orthostatic dx vs small BP changes with position changes, encouraged taking time with transitions   Manual Retrograde edema massage with LEs elevated    TODAY'S TREATMENT                                                                          DATE: 02/16/2023 Therex:    HEP instruction/performance c cues for techniques, handout provided.  Trial set performed of each for comprehension and symptom assessment.  See below for exercise list  Vaso pneumatic  Lt ankle /knee combo medium compression 34 deg 10 mins  in elevation  PATIENT EDUCATION:  02/21/2023  Education details: HEP update Person educated: Patient Education method: Explanation, Demonstration, Verbal cues, and Handouts Education comprehension: verbalized understanding, returned demonstration, and verbal cues required  HOME EXERCISE PROGRAM: Access Code: [email or web address] URL: [email or web address] Date: 02/21/2023 Prepared by: Chyrel Masson  Exercises - Supine Quadricep Sets  - 3-5 x daily - 7 x weekly - 1 sets - 10 reps - 5 hold - Supine Heel Slide (Mirrored)  - 3-5 x daily - 7 x weekly - 1-2 sets - 10 reps - 2 hold - Small Range Straight Leg Raise  - 1-2 x daily - 7 x weekly - 1-2 sets - 10-15 reps - Seated Long Arc Quad (Mirrored)  - 3-5 x daily - 7 x weekly - 1-2 sets - 10 reps - 2 hold - Seated Quad Set (Mirrored)  - 3-5 x daily - 7 x weekly - 1 sets - 10 reps - 5 hold - Seated Gastroc  Stretch with Strap (Mirrored)  - 2-3 x daily - 7 x weekly - 1 sets - 3 reps - 30 hold - Long Sitting Eccentric Ankle Plantar Flexion with Resistance  - 1-2 x daily - 7 x weekly - 3 sets - 10 reps  ASSESSMENT:  CLINICAL IMPRESSION: Pt arrives w/ report of increased pain over past few days, made appropriate modifications accordingly. Symptoms fairly irritable throughout although pt notes improving symptoms as session goes on with test-treat-retest ambulation between exercises. Reports good relief from vaso at end of session. No adverse events, departs with report of 6.5-7/10 pain compared to 8/10 on arrival. She continues to endorse intermittent dizziness that is chronic and non worsening pre pt report (only occurs 1x/during session, transient and resolves with seated rest, vitals stable and WNL), encouraged her to monitor and discuss with her primary care. Recommend continuing along current POC in order to address relevant deficits and improve functional tolerance. Pt departs today's session in no acute distress, all voiced questions/concerns addressed appropriately from PT perspective.     OBJECTIVE IMPAIRMENTS: Abnormal gait, decreased activity tolerance, decreased balance, decreased coordination, decreased endurance, decreased mobility, difficulty walking, decreased ROM, decreased strength, hypomobility, increased edema, impaired perceived functional ability, impaired flexibility, improper body mechanics, postural dysfunction, and pain.   ACTIVITY LIMITATIONS: carrying, lifting, bending, sitting, standing, squatting, sleeping, stairs, transfers, bed mobility, bathing, toileting, dressing, and locomotion level  PARTICIPATION LIMITATIONS: meal prep, cleaning, laundry, interpersonal relationship, driving, shopping, and community activity  PERSONAL FACTORS:  History of 05/2022 lumbar surgery, GERD, COPD, HTN, history of arthritis, multiple treatment areas current  are also affecting patient's functional  outcome.   REHAB POTENTIAL: Fair to good  CLINICAL DECISION MAKING: Evolving/moderate complexity  EVALUATION COMPLEXITY: Moderate   GOALS: Goals reviewed with patient? Yes  SHORT TERM GOALS: (target date for Short term goals are 3 weeks 03/09/2023)   1.  Patient will demonstrate independent use of home exercise program to maintain progress from in clinic treatments.  Goal status: New  LONG TERM GOALS: (target dates for all long term goals are 10 weeks  04/27/2023 )   1. Patient will demonstrate/report pain at worst less than or equal to 2/10 to facilitate minimal limitation in daily activity secondary to pain symptoms.  Goal status: New   2. Patient will demonstrate independent use of home exercise program to facilitate ability to maintain/progress functional gains from skilled physical therapy services.  Goal status: New   3. Patient will demonstrate FOTO outcome > or = 54 % to indicate  reduced disability due to condition.  Goal status: New   4.  Patient will demonstrate Lt LE MMT 5/5 throughout to faciltiate usual transfers, stairs, squatting at [facility] for daily life.   Goal status: New   5.  Patient will demonstrate ability to ascend/descend stairs reciprocal gait pattern with single hand rail assist.  Goal status: New   6.  Patient will demonstrate Lt knee AROM 0-120 deg to facilitate ability to perform transfers, ambulation and other activity in daily life.  Goal status: New   7.  Patient will demonstrate ability to ambulation community distances > 500 ft.  Goal Status: New   PLAN:  PT FREQUENCY: 1-2x/week  PT DURATION: 10 weeks  PLANNED INTERVENTIONS: Therapeutic exercises, Therapeutic activity, Neuro Muscular re-education, Balance training, Gait training, Patient/Family education, Joint mobilization, Stair training, DME instructions, Dry Needling, Electrical stimulation, Traction, Cryotherapy, vasopneumatic deviceMoist heat, Taping, Ultrasound, Ionotophoresis  4mg /ml Dexamethasone, and aquatic therapy, Manual therapy.  All included unless contraindicated  PLAN FOR NEXT SESSION: VASO ankle/knee for edema.  Progressive strengthening as tolerated for ankle/knee.    Ashley Murrain PT, DPT 02/27/2023 4:26 PM

## 2023-03-01 ENCOUNTER — Ambulatory Visit (INDEPENDENT_AMBULATORY_CARE_PROVIDER_SITE_OTHER): Payer: Medicare Other | Admitting: Dermatology

## 2023-03-01 ENCOUNTER — Encounter (INDEPENDENT_AMBULATORY_CARE_PROVIDER_SITE_OTHER): Payer: Self-pay

## 2023-03-01 ENCOUNTER — Encounter: Payer: Self-pay | Admitting: Dermatology

## 2023-03-01 DIAGNOSIS — L739 Follicular disorder, unspecified: Secondary | ICD-10-CM

## 2023-03-01 MED ORDER — DOXYCYCLINE HYCLATE 100 MG PO TABS
100.0000 mg | ORAL_TABLET | Freq: Every day | ORAL | 0 refills | Status: AC
Start: 2023-03-01 — End: 2023-03-31

## 2023-03-01 NOTE — Patient Instructions (Addendum)
Hi Frances Nelson,  Thank you for visiting the clinic today. Your dedication to enhancing your skin health is greatly appreciated. Below is a summary of the essential instructions from our discussion:  - Increase Adapalene Use:   - Application: Apply adapalene to the affected areas five nights a week, with a break during the weekends.   - Moisturization: Use a moisturizer to counteract dryness.  - Oral Medication:   - Medication: Begin taking doxycycline once daily with food to mitigate potential nausea.   - Expectation: It may take a couple of weeks to observe the effects of this treatment.  - Clobetasol Application:   - Usage: Continue applying clobetasol to the itchy spot on your back, following a two-week on, two-week off schedule.  - Follow-Up Appointment:   - Schedule: We will see you back in two months to assess the treatments' effectiveness and discuss any necessary adjustments.  - Pharmacy Pricing:   - Savings Tip: Use GoodRx to find the best prices for your prescriptions. [facility] may offer competitive pricing on medications and skincare products.  - Skincare Products:   - Products Provided: Samples of CeraVe and Cetaphil moisturizers have been provided to help manage dryness associated with your treatment.  Please adhere to these instructions carefully and reach out to the office with any questions or concerns. We are looking forward to your follow-up in two months.  Warm regards,  Dr. Langston Reusing [facility] Important Information  Due to recent changes in healthcare laws, you may see results of your pathology and/or laboratory studies on MyChart before the doctors have had a chance to review them. We understand that in some cases there may be results that are confusing or concerning to you. Please understand that not all results are received at the same time and often the doctors may need to interpret multiple results in order to provide you with the best plan of care or course  of treatment. Therefore, we ask that you please give [place] 2 business days to thoroughly review all your results before contacting the office for clarification. Should we see a critical lab result, you will be contacted sooner.   If You Need Anything After Your Visit  If you have any questions or concerns for your doctor, please call our main line at [phone] If no one answers, please leave a voicemail as directed and we will return your call as soon as possible. Messages left after 4 pm will be answered the following business day.   You may also send [place] a message via MyChart. We typically respond to MyChart messages within 1-2 business days.  For prescription refills, please ask your pharmacy to contact our office. Our fax number is [phone].  If you have an urgent issue when the clinic is closed that cannot wait until the next business day, you can page your doctor at the number below.    Please note that while we do our best to be available for urgent issues outside of office hours, we are not available 24/7.   If you have an urgent issue and are unable to reach [place], you may choose to seek medical care at your doctor's office, retail clinic, urgent care center, or emergency room.  If you have a medical emergency, please immediately call 911 or go to the emergency department. In the event of inclement weather, please call our main line at [phone] for an update on the status of any delays or closures.  Dermatology Medication Tips: Please keep the  boxes that topical medications come in in order to help keep track of the instructions about where and how to use these. Pharmacies typically print the medication instructions only on the boxes and not directly on the medication tubes.   If your medication is too expensive, please contact our office at [phone] or send [place] a message through MyChart.   We are unable to tell what your co-pay for medications will be in advance as this is  different depending on your insurance coverage. However, we may be able to find a substitute medication at lower cost or fill out paperwork to get insurance to cover a needed medication.   If a prior authorization is required to get your medication covered by your insurance company, please allow [place] 1-2 business days to complete this process.  Drug prices often vary depending on where the prescription is filled and some pharmacies may offer cheaper prices.  The website www.goodrx.com contains coupons for medications through different pharmacies. The prices here do not account for what the cost may be with help from insurance (it may be cheaper with your insurance), but the website can give you the price if you did not use any insurance.  - You can print the associated coupon and take it with your prescription to the pharmacy.  - You may also stop by our office during regular business hours and pick up a GoodRx coupon card.  - If you need your prescription sent electronically to a different pharmacy, notify our office through [facility] or by phone at [phone]

## 2023-03-01 NOTE — Progress Notes (Signed)
   Follow-Up Visit   Subjective  Frances Nelson is a 67 y.o. female who presents for the following: Folliculitis  Patient present today for follow up visit for folliculitis. Patient was last evaluated on 11/29/22. Patient reports sxs are unchanged & Not at goal.She reports the areas are still very itchy and irritating. At her previous visit, she was prescribed Clindamycin Swabs to use daily, and Nightly she was prescribed Adapalene Gel to use on M-W-F. Patient reports she uses the Clindamycin swabs in the morning and the Adapalene Gel M-W-F Nights. Patient reports no medication changes. She also applies Clobetasol cream 2 times daily for 2 weeks then she stops, Her most recent dose was applied last night.   The following portions of the chart were reviewed this encounter and updated as appropriate: medications, allergies, medical history  Review of Systems:  No other skin or systemic complaints except as noted in HPI or Assessment and Plan.  Objective  Well appearing patient in no apparent distress; mood and affect are within normal limits.  A focused examination was performed of the following areas: Face and B/L Posterior Ears  Relevant exam findings are noted in the Assessment and Plan.              Assessment & Plan   FOLLICULITIS (not at goal) Exam: Perifollicular erythematous papules and pustules  Folliculitis occurs due to inflammation of the superficial hair follicle (pore), resulting in acne-like lesions (pus bumps). It can be infectious (bacterial, fungal) or noninfectious (shaving, tight clothing, heat/sweat, medications).  Folliculitis can be acute or chronic and recommended treatment depends on the underlying cause of folliculitis.  Treatment Plan: - We will increase Adapalene to 5 nights per week - We will also prescribe Doxycycline 100 mg tablet, Take once daily (TAKE WITH HEAVY MEALS) - We will will plan to reassess in 2 months  Folliculitis  Related  Medications doxycycline (VIBRA-TABS) 100 MG tablet Take 1 tablet (100 mg total) by mouth daily. TAKE WITH FOOD TO PREVENT ABDOMINAL UPSET   Return in about 2 months (around 05/01/2023) for Folliculitis F/U.  Documentation: I have reviewed the above documentation for accuracy and completeness, and I agree with the above.  Stasia Cavalier, am acting as scribe for Langston Reusing, DO.  Langston Reusing, DO

## 2023-03-02 ENCOUNTER — Encounter: Payer: Self-pay | Admitting: Rehabilitative and Restorative Service Providers"

## 2023-03-02 ENCOUNTER — Ambulatory Visit (INDEPENDENT_AMBULATORY_CARE_PROVIDER_SITE_OTHER): Payer: Medicare Other | Admitting: Rehabilitative and Restorative Service Providers"

## 2023-03-02 DIAGNOSIS — M79662 Pain in left lower leg: Secondary | ICD-10-CM | POA: Diagnosis not present

## 2023-03-02 DIAGNOSIS — R262 Difficulty in walking, not elsewhere classified: Secondary | ICD-10-CM | POA: Diagnosis not present

## 2023-03-02 DIAGNOSIS — M25562 Pain in left knee: Secondary | ICD-10-CM

## 2023-03-02 DIAGNOSIS — G8929 Other chronic pain: Secondary | ICD-10-CM

## 2023-03-02 DIAGNOSIS — R600 Localized edema: Secondary | ICD-10-CM

## 2023-03-02 DIAGNOSIS — M6281 Muscle weakness (generalized): Secondary | ICD-10-CM

## 2023-03-02 NOTE — Therapy (Signed)
OUTPATIENT PHYSICAL THERAPY TREATMENT   Patient Name: Frances Nelson MRN: [id number] DOB:September 11, 1955, 67 y.o., female Today's Date: 03/02/2023  END OF SESSION:  PT End of Session - 03/02/23 1150     Visit Number 5    Number of Visits 20    Date for PT Re-Evaluation 04/27/23    Authorization Type MCR    Progress Note Due on Visit 10    PT Start Time 1141    PT Stop Time 1230    PT Time Calculation (min) 49 min    Activity Tolerance Patient limited by pain    Behavior During Therapy WFL for tasks assessed/performed                 Past Medical History:  Diagnosis Date   Arthritis    Asthma    COPD (chronic obstructive pulmonary disease) (HCC)    GERD (gastroesophageal reflux disease)    Hypertension    Pneumonia    Past Surgical History:  Procedure Laterality Date   ABDOMINAL HYSTERECTOMY     APPENDECTOMY     HERNIA REPAIR     Umbilical x 3   TONSILLECTOMY     TRANSFORAMINAL LUMBAR INTERBODY FUSION (TLIF) WITH PEDICLE SCREW FIXATION 1 LEVEL N/A 05/18/2022   Procedure: Lumbar Four-Five Open Laminectomy/Transforaminal Lumbar Interbody Fusion/Posterolateral fusion;  Surgeon: Jadene Pierini, MD;  Location: [facility] OR;  Service: Neurosurgery;  Laterality: N/A;   Patient Active Problem List   Diagnosis Date Noted   Urethritis 10/05/2022   External otitis of left ear 10/05/2022   Spondylolisthesis of lumbar region 05/18/2022   Smoker 04/08/2022   Deviated nasal septum 04/08/2022   Bilateral pain of leg and foot 04/08/2022   Follicular acne 04/06/2022   Baker cyst, left 04/06/2022   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   Depression 10/04/2021   Left knee pain 10/04/2021   Chronic pain 04/09/2021   Pelvic pain 04/09/2021   HLD (hyperlipidemia) 04/09/2021   Vitamin D deficiency 04/09/2021   Estrogen deficiency 04/09/2021   Hyperglycemia 04/01/2021   Chronic cough 12/30/2020   GERD (gastroesophageal reflux disease) 11/06/2019    Bilateral leg edema 10/16/2019   Pleuritic chest pain 10/16/2019   Hypertension 10/02/2019   Arthralgia 06/28/2018   Sinusitis 06/28/2018   Renal cyst 05/03/2018   Syncope 05/03/2018   Stress and adjustment reaction 05/03/2018   History of tobacco use 05/02/2018   Primary osteoarthritis of both hands 04/10/2018   Primary osteoarthritis of both knees 04/10/2018   Primary osteoarthritis of both feet 04/10/2018   Achilles tendinitis 04/02/2018   Screening for breast cancer 03/15/2018   Positive ANA (antinuclear antibody) 03/15/2018   Screening for colon cancer 03/15/2018   Rash 03/15/2018   Achilles tendon pain 11/13/2017   Cervical myelopathy with cervical radiculopathy (HCC) 11/13/2017   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   Allergic rhinitis 06/19/2017   Edema 12/24/2015   Carpal tunnel syndrome, right 12/16/2015   Hypercalcemia 11/11/2015   Pain in both upper extremities 11/11/2015   Drug reaction 10/28/2015   Muscle spasms of both lower extremities 10/28/2015   Nerve pain 10/28/2015   Chronic asthmatic bronchitis with acute exacerbation (HCC) 09/06/2015   Environmental and seasonal allergies 07/17/2014    PCP: Corwin Levins MD  REFERRING PROVIDER: Kathryne Hitch, MD  REFERRING DIAG: [phone] (ICD-10-CM) - Status post arthroscopic surgery of left knee M76.60 (ICD-10-CM) - Achilles tendon pain  THERAPY DIAG:  Chronic pain of left knee  Muscle weakness (generalized)  Pain in left lower leg  Difficulty in walking, not elsewhere classified  Localized edema  Rationale for Evaluation and Treatment: Rehabilitation  ONSET DATE: Surgery 12/21/2022 on Lt knee  SUBJECTIVE:   SUBJECTIVE STATEMENT: Pt indicated feeling some reduction of complaints following last visit.  She reported doing some more walking and feeling fairly well with that.  Indicated having a massage that she thought was helpful at home.    PERTINENT HISTORY: History of 05/2022 lumbar surgery,  GERD, COPD, HTN, history of arthritis  PAIN:  NPRS scale: knee 5/10 upon arrival, ankle 6/10 upon arrival Pain location: Lt knee, Lt achilles Pain description: constant, throbbing,  Aggravating factors: sitting, prolonged WB activity, walking, bending, squatting, stairs Relieving factors: nothing specific   PRECAUTIONS: None  WEIGHT BEARING RESTRICTIONS: No  FALLS:  Has patient fallen in last 6 months? 1 fall without medical attention.   LIVING ENVIRONMENT: Lives in: House/apartment Stairs: multistory with bedroom upstairs but has moved bed downstairs.   Rail on Lt side going up.   OCCUPATION: Retired   PLOF: Independent, walking for exercise (5-6 miles/day in the past until 2012).  Dog walking   PATIENT GOALS: Reduce pain, move better, walk better without cane.   OBJECTIVE: (objective measures completed at initial evaluation unless otherwise dated)   PATIENT SURVEYS:  02/16/2023 FOTO intake:  38  predicted:  54  COGNITION: 02/16/2023 Overall cognitive status: WFL    SENSATION: 02/16/2023 No specific check today  EDEMA:  02/16/2023 Lt leg edema noted from knee down compared to Lt.   MUSCLE LENGTH: 02/16/2023 No specific testing  POSTURE:  02/16/2023 Reduced lumbar lordosis in standing, Weight shift off Lt leg to Rt.   PALPATION: 02/16/2023 Tenderness around swollen Lt ankle, Lt knee to light touch.   LOWER EXTREMITY ROM:   ROM Right 02/16/2023 Left 02/16/2023 Left 02/21/2023 Left 03/02/2023  Hip flexion      Hip extension      Hip abduction      Hip adduction      Hip internal rotation      Hip external rotation      Knee flexion 125 92 AROM in supine heel slide c pain PROM 95/100 grossly assesed in sitting.  90 AROM in supine heel slide c pain  Knee extension 0 0 AROM in quad set supine  0 AROM in seated quad set  Ankle dorsiflexion      Ankle plantarflexion      Ankle inversion      Ankle eversion       (Blank rows = not tested)  LOWER EXTREMITY MMT:  MMT  Right 02/16/2023 Left 02/16/2023  Hip flexion 5/5 4/5  Hip extension    Hip abduction    Hip adduction    Hip internal rotation    Hip external rotation    Knee flexion 5/5 4/5  Knee extension 4/5 3+/5  Ankle dorsiflexion 5/5 3+/5 c pain  Ankle plantarflexion  2+/5 c pain (unable to perform standing PF)  Ankle inversion 5/5 3+/5 c pain  Ankle eversion 5/5 3+/5 c pain   (Blank rows = not tested)  LOWER EXTREMITY SPECIAL TESTS:  02/16/2023 No specific testing today.   FUNCTIONAL TESTS:  02/16/2023 18 inch chair transfer: multiple tries required c noted deviation to Rt leg Lt SLS:  < 3 seconds Rt SLS: < 3 seconds  GAIT: 02/16/2023 Mod independent ambulation c SPC in Rt UE, decreased stance on Lt leg, decreased toe off progression noted.  TODAY'S TREATMENT                                                                                                   DATE: 03/02/2023 Therapeutic Exercise: Nustep lvl 5 UE/LE 10 mins  Incline calf stretch 30 sec x 5  Seated Lt LAQ 2 sec stretch in end ranges x 15  Supine Lt quad set 5 sec hold x 10  Supine Lt quad set c SLR 2 x 10  Lt ankle PF green band 2 x 10 Supine LAQ Lt knee in 90 deg hip flexion x 15 c strap holding leg  Modalities: Vaso Lt knee/ankle combo, medium compression 34 deg , good relief with no adverse events; supine w/ LE elevation  TODAY'S TREATMENT                                                                                                   DATE: 02/27/23 Therapeutic Exercise: Quad set with heel prop x10 cues for quad contraction Seated march 2x10 cues for breath control and trunk mechanics Seated quad set x10 LLE only cues for quad contraction Seated heel/toe raises x10 cues for comfortable ROM Seated gastroc stretch 3x30sec with strap cues for breath  control    Manual Therapy: Supine knee flex/ext LLE to pt tolerance, limited by pain rather than stiffness, better tolerance noted to extension compared to extension  Modalities: Vaso L knee/ankle combo, medium compression 34 deg , good relief with no adverse events; supine w/ LE elevation   TODAY'S TREATMENT                                                                                                   DATE: 02/21/2023 Therex: Nustep lvl 5 8 mins UE/LE  Seated Lt knee alternating sub max/no pain isometric holds 5 sec for extension and flexion x 12 each way Supine elevated Lt gastroc stretch c strap 30 sec x 3 Lt leg PF green band with eccentric return focus 2 x 10   Manual: Seated Lt knee flexion c distraction/IR mobilization with movement.  Lt ankle overpressure DF stretch in sitting.   Vaso pneumatic  Lt ankle /knee combo medium compression 34 deg 10 mins  in elevation    PATIENT EDUCATION:  02/21/2023  Education details: HEP update  Person educated: Patient Education method: Explanation, Demonstration, Verbal cues, and Handouts Education comprehension: verbalized understanding, returned demonstration, and verbal cues required  HOME EXERCISE PROGRAM: Access Code: [email or web address] URL: [email or web address] Date: 02/21/2023 Prepared by: Chyrel Masson  Exercises - Supine Quadricep Sets  - 3-5 x daily - 7 x weekly - 1 sets - 10 reps - 5 hold - Supine Heel Slide (Mirrored)  - 3-5 x daily - 7 x weekly - 1-2 sets - 10 reps - 2 hold - Small Range Straight Leg Raise  - 1-2 x daily - 7 x weekly - 1-2 sets - 10-15 reps - Seated Long Arc Quad (Mirrored)  - 3-5 x daily - 7 x weekly - 1-2 sets - 10 reps - 2 hold - Seated Quad Set (Mirrored)  - 3-5 x daily - 7 x weekly - 1 sets - 10 reps - 5 hold - Seated Gastroc Stretch with Strap (Mirrored)  - 2-3 x daily - 7 x weekly - 1 sets - 3 reps - 30 hold - Long Sitting Eccentric Ankle Plantar Flexion with Resistance  - 1-2 x daily  - 7 x weekly - 3 sets - 10 reps  ASSESSMENT:  CLINICAL IMPRESSION: Pt arrives w/ report of increased pain over past few days, made appropriate modifications accordingly. Symptoms fairly irritable throughout although pt notes improving symptoms as session goes on with test-treat-retest ambulation between exercises. Reports good relief from vaso at end of session. No adverse events, departs with report of 6.5-7/10 pain compared to 8/10 on arrival. She continues to endorse intermittent dizziness that is chronic and non worsening pre pt report (only occurs 1x/during session, transient and resolves with seated rest, vitals stable and WNL), encouraged her to monitor and discuss with her primary care. Recommend continuing along current POC in order to address relevant deficits and improve functional tolerance. Pt departs today's session in no acute distress, all voiced questions/concerns addressed appropriately from PT perspective.     OBJECTIVE IMPAIRMENTS: Abnormal gait, decreased activity tolerance, decreased balance, decreased coordination, decreased endurance, decreased mobility, difficulty walking, decreased ROM, decreased strength, hypomobility, increased edema, impaired perceived functional ability, impaired flexibility, improper body mechanics, postural dysfunction, and pain.   ACTIVITY LIMITATIONS: carrying, lifting, bending, sitting, standing, squatting, sleeping, stairs, transfers, bed mobility, bathing, toileting, dressing, and locomotion level  PARTICIPATION LIMITATIONS: meal prep, cleaning, laundry, interpersonal relationship, driving, shopping, and community activity  PERSONAL FACTORS:  History of 05/2022 lumbar surgery, GERD, COPD, HTN, history of arthritis, multiple treatment areas current  are also affecting patient's functional outcome.   REHAB POTENTIAL: Fair to good  CLINICAL DECISION MAKING: Evolving/moderate complexity  EVALUATION COMPLEXITY: Moderate   GOALS: Goals reviewed  with patient? Yes  SHORT TERM GOALS: (target date for Short term goals are 3 weeks 03/09/2023)   1.  Patient will demonstrate independent use of home exercise program to maintain progress from in clinic treatments.  Goal status: on going 03/02/2023  LONG TERM GOALS: (target dates for all long term goals are 10 weeks  04/27/2023 )   1. Patient will demonstrate/report pain at worst less than or equal to 2/10 to facilitate minimal limitation in daily activity secondary to pain symptoms.  Goal status: on going 03/02/2023   2. Patient will demonstrate independent use of home exercise program to facilitate ability to maintain/progress functional gains from skilled physical therapy services.  Goal status: on going 03/02/2023   3. Patient will demonstrate FOTO outcome > or = 54 % to indicate reduced disability due to condition.  Goal status: on going 03/02/2023   4.  Patient will demonstrate Lt LE MMT 5/5 throughout to faciltiate usual transfers, stairs, squatting at [facility] for daily life.   Goal status: on going 03/02/2023   5.  Patient will demonstrate ability to ascend/descend stairs reciprocal gait pattern with single hand rail assist.  Goal status: on going 03/02/2023   6.  Patient will demonstrate Lt knee AROM 0-120 deg to facilitate ability to perform transfers, ambulation and other activity in daily life.  Goal status: on going 03/02/2023   7.  Patient will demonstrate ability to ambulation community distances > 500 ft.  Goal Status: on going 03/02/2023   PLAN:  PT FREQUENCY: 1-2x/week  PT DURATION: 10 weeks  PLANNED INTERVENTIONS: Therapeutic exercises, Therapeutic activity, Neuro Muscular re-education, Balance training, Gait training, Patient/Family education, Joint mobilization, Stair training, DME instructions, Dry Needling, Electrical stimulation, Traction, Cryotherapy, vasopneumatic deviceMoist heat, Taping, Ultrasound, Ionotophoresis 4mg /ml Dexamethasone, and aquatic therapy,  Manual therapy.  All included unless contraindicated  PLAN FOR NEXT SESSION: Continue to address swelling in ROM/vaso.  Progressive strengthening as able with ankle and knee mobility gains.    Chyrel Masson, PT, DPT, OCS, ATC 03/02/23  12:21 PM

## 2023-03-06 ENCOUNTER — Ambulatory Visit (INDEPENDENT_AMBULATORY_CARE_PROVIDER_SITE_OTHER): Payer: Medicare Other | Admitting: Physical Therapy

## 2023-03-06 ENCOUNTER — Encounter: Payer: Self-pay | Admitting: Physical Therapy

## 2023-03-06 DIAGNOSIS — M79662 Pain in left lower leg: Secondary | ICD-10-CM | POA: Diagnosis not present

## 2023-03-06 DIAGNOSIS — R262 Difficulty in walking, not elsewhere classified: Secondary | ICD-10-CM | POA: Diagnosis not present

## 2023-03-06 DIAGNOSIS — M6281 Muscle weakness (generalized): Secondary | ICD-10-CM | POA: Diagnosis not present

## 2023-03-06 DIAGNOSIS — M25562 Pain in left knee: Secondary | ICD-10-CM

## 2023-03-06 DIAGNOSIS — R600 Localized edema: Secondary | ICD-10-CM

## 2023-03-06 DIAGNOSIS — G8929 Other chronic pain: Secondary | ICD-10-CM

## 2023-03-06 NOTE — Therapy (Signed)
OUTPATIENT PHYSICAL THERAPY TREATMENT   Patient Name: KATELAN KIPPER MRN: [id number] DOB:1956-04-25, 67 y.o., female Today's Date: 03/06/2023  END OF SESSION:  PT End of Session - 03/06/23 1144     Visit Number 6    Number of Visits 20    Date for PT Re-Evaluation 04/27/23    Authorization Type MCR    Progress Note Due on Visit 10    PT Start Time 1144    PT Stop Time 1230    PT Time Calculation (min) 46 min    Activity Tolerance Patient limited by pain    Behavior During Therapy WFL for tasks assessed/performed                 Past Medical History:  Diagnosis Date   Arthritis    Asthma    COPD (chronic obstructive pulmonary disease) (HCC)    GERD (gastroesophageal reflux disease)    Hypertension    Pneumonia    Past Surgical History:  Procedure Laterality Date   ABDOMINAL HYSTERECTOMY     APPENDECTOMY     HERNIA REPAIR     Umbilical x 3   TONSILLECTOMY     TRANSFORAMINAL LUMBAR INTERBODY FUSION (TLIF) WITH PEDICLE SCREW FIXATION 1 LEVEL N/A 05/18/2022   Procedure: Lumbar Four-Five Open Laminectomy/Transforaminal Lumbar Interbody Fusion/Posterolateral fusion;  Surgeon: Jadene Pierini, MD;  Location: [facility] OR;  Service: Neurosurgery;  Laterality: N/A;   Patient Active Problem List   Diagnosis Date Noted   Urethritis 10/05/2022   External otitis of left ear 10/05/2022   Spondylolisthesis of lumbar region 05/18/2022   Smoker 04/08/2022   Deviated nasal septum 04/08/2022   Bilateral pain of leg and foot 04/08/2022   Follicular acne 04/06/2022   Baker cyst, left 04/06/2022   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   Depression 10/04/2021   Left knee pain 10/04/2021   Chronic pain 04/09/2021   Pelvic pain 04/09/2021   HLD (hyperlipidemia) 04/09/2021   Vitamin D deficiency 04/09/2021   Estrogen deficiency 04/09/2021   Hyperglycemia 04/01/2021   Chronic cough 12/30/2020   GERD (gastroesophageal reflux disease) 11/06/2019    Bilateral leg edema 10/16/2019   Pleuritic chest pain 10/16/2019   Hypertension 10/02/2019   Arthralgia 06/28/2018   Sinusitis 06/28/2018   Renal cyst 05/03/2018   Syncope 05/03/2018   Stress and adjustment reaction 05/03/2018   History of tobacco use 05/02/2018   Primary osteoarthritis of both hands 04/10/2018   Primary osteoarthritis of both knees 04/10/2018   Primary osteoarthritis of both feet 04/10/2018   Achilles tendinitis 04/02/2018   Screening for breast cancer 03/15/2018   Positive ANA (antinuclear antibody) 03/15/2018   Screening for colon cancer 03/15/2018   Rash 03/15/2018   Achilles tendon pain 11/13/2017   Cervical myelopathy with cervical radiculopathy (HCC) 11/13/2017   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   Allergic rhinitis 06/19/2017   Edema 12/24/2015   Carpal tunnel syndrome, right 12/16/2015   Hypercalcemia 11/11/2015   Pain in both upper extremities 11/11/2015   Drug reaction 10/28/2015   Muscle spasms of both lower extremities 10/28/2015   Nerve pain 10/28/2015   Chronic asthmatic bronchitis with acute exacerbation (HCC) 09/06/2015   Environmental and seasonal allergies 07/17/2014    PCP: Corwin Levins MD  REFERRING PROVIDER: Kathryne Hitch, MD  REFERRING DIAG: [phone] (ICD-10-CM) - Status post arthroscopic surgery of left knee M76.60 (ICD-10-CM) - Achilles tendon pain  THERAPY DIAG:  Chronic pain of left knee  Muscle weakness (generalized)  Pain in left lower leg  Difficulty in walking, not elsewhere classified  Localized edema  Rationale for Evaluation and Treatment: Rehabilitation  ONSET DATE: Surgery 12/21/2022 on Lt knee  SUBJECTIVE:   SUBJECTIVE STATEMENT: She relays pain is about the same 6/10 overall for left leg   PERTINENT HISTORY: History of 05/2022 lumbar surgery, GERD, COPD, HTN, history of arthritis  PAIN:  NPRS scale: knee 5/10 upon arrival, ankle 6/10 upon arrival Pain location: Lt knee, Lt achilles Pain  description: constant, throbbing,  Aggravating factors: sitting, prolonged WB activity, walking, bending, squatting, stairs Relieving factors: nothing specific   PRECAUTIONS: None  WEIGHT BEARING RESTRICTIONS: No  FALLS:  Has patient fallen in last 6 months? 1 fall without medical attention.   LIVING ENVIRONMENT: Lives in: House/apartment Stairs: multistory with bedroom upstairs but has moved bed downstairs.   Rail on Lt side going up.   OCCUPATION: Retired   PLOF: Independent, walking for exercise (5-6 miles/day in the past until 2012).  Dog walking   PATIENT GOALS: Reduce pain, move better, walk better without cane.   OBJECTIVE: (objective measures completed at initial evaluation unless otherwise dated)   PATIENT SURVEYS:  02/16/2023 FOTO intake:  38  predicted:  54  COGNITION: 02/16/2023 Overall cognitive status: WFL    SENSATION: 02/16/2023 No specific check today  EDEMA:  02/16/2023 Lt leg edema noted from knee down compared to Lt.   MUSCLE LENGTH: 02/16/2023 No specific testing  POSTURE:  02/16/2023 Reduced lumbar lordosis in standing, Weight shift off Lt leg to Rt.   PALPATION: 02/16/2023 Tenderness around swollen Lt ankle, Lt knee to light touch.   LOWER EXTREMITY ROM:   ROM Right 02/16/2023 Left 02/16/2023 Left 02/21/2023 Left 03/02/2023  Hip flexion      Hip extension      Hip abduction      Hip adduction      Hip internal rotation      Hip external rotation      Knee flexion 125 92 AROM in supine heel slide c pain PROM 95/100 grossly assesed in sitting.  90 AROM in supine heel slide c pain  Knee extension 0 0 AROM in quad set supine  0 AROM in seated quad set  Ankle dorsiflexion      Ankle plantarflexion      Ankle inversion      Ankle eversion       (Blank rows = not tested)  LOWER EXTREMITY MMT:  MMT Right 02/16/2023 Left 02/16/2023  Hip flexion 5/5 4/5  Hip extension    Hip abduction    Hip adduction    Hip internal rotation    Hip external  rotation    Knee flexion 5/5 4/5  Knee extension 4/5 3+/5  Ankle dorsiflexion 5/5 3+/5 c pain  Ankle plantarflexion  2+/5 c pain (unable to perform standing PF)  Ankle inversion 5/5 3+/5 c pain  Ankle eversion 5/5 3+/5 c pain   (Blank rows = not tested)  LOWER EXTREMITY SPECIAL TESTS:  02/16/2023 No specific testing today.   FUNCTIONAL TESTS:  02/16/2023 18 inch chair transfer: multiple tries required c noted deviation to Rt leg Lt SLS:  < 3 seconds Rt SLS: < 3 seconds  GAIT: 02/16/2023 Mod independent ambulation c SPC in Rt UE, decreased stance on Lt leg, decreased toe off progression noted.  TODAY'S TREATMENT                                                                                                    DATE: 03/06/2023 Therapeutic Exercise: Nustep lvl 5 UE/LE 10 mins  Incline calf stretch 30 sec x 5  Seated Lt LAQ 2 sec stretch in end ranges x 15  Supine Lt quad set 5 sec hold x 10  Supine Lt quad set c SLR 2 x 10  Lt ankle PF green band 2 x 10 Seated leg press green 2X10  Modalities: Vaso Lt knee/ankle combo, medium compression 34 deg , good relief with no adverse events; supine w/ LE elevation DATE: 03/02/2023 Therapeutic Exercise: Nustep lvl 5 UE/LE 10 mins  Incline calf stretch 30 sec x 5  Seated Lt LAQ 2 sec stretch in end ranges x 15  Supine Lt quad set 5 sec hold x 10  Supine Lt quad set c SLR 2 x 10  Lt ankle PF green band 2 x 10 Supine LAQ Lt knee in 90 deg hip flexion x 15 c strap holding leg  Modalities: Vaso Lt knee/ankle combo, medium compression 34 deg , good relief with no adverse events; supine w/ LE elevation  TODAY'S TREATMENT                                                                                                   DATE: 02/27/23 Therapeutic Exercise: Quad set with  heel prop x10 cues for quad contraction Seated march 2x10 cues for breath control and trunk mechanics Seated quad set x10 LLE only cues for quad contraction Seated heel/toe raises x10 cues for comfortable ROM Seated gastroc stretch 3x30sec with strap cues for breath control    Manual Therapy: Supine knee flex/ext LLE to pt tolerance, limited by pain rather than stiffness, better tolerance noted to extension compared to extension  Modalities: Vaso L knee/ankle combo, medium compression 34 deg , good relief with no adverse events; supine w/ LE elevation   TODAY'S TREATMENT                                                                                                   DATE: 02/21/2023 Therex: Nustep lvl 5 8 mins UE/LE  Seated Lt knee alternating sub max/no  pain isometric holds 5 sec for extension and flexion x 12 each way Supine elevated Lt gastroc stretch c strap 30 sec x 3 Lt leg PF green band with eccentric return focus 2 x 10   Manual: Seated Lt knee flexion c distraction/IR mobilization with movement.  Lt ankle overpressure DF stretch in sitting.   Vaso pneumatic  Lt ankle /knee combo medium compression 34 deg 10 mins  in elevation    PATIENT EDUCATION:  02/21/2023  Education details: HEP update Person educated: Patient Education method: Programmer, multimedia, Demonstration, Verbal cues, and Handouts Education comprehension: verbalized understanding, returned demonstration, and verbal cues required  HOME EXERCISE PROGRAM: Access Code: [email or web address] URL: [email or web address] Date: 02/21/2023 Prepared by: Chyrel Masson  Exercises - Supine Quadricep Sets  - 3-5 x daily - 7 x weekly - 1 sets - 10 reps - 5 hold - Supine Heel Slide (Mirrored)  - 3-5 x daily - 7 x weekly - 1-2 sets - 10 reps - 2 hold - Small Range Straight Leg Raise  - 1-2 x daily - 7 x weekly - 1-2 sets - 10-15 reps - Seated Long Arc Quad (Mirrored)  - 3-5 x daily - 7 x weekly - 1-2 sets - 10 reps - 2  hold - Seated Quad Set (Mirrored)  - 3-5 x daily - 7 x weekly - 1 sets - 10 reps - 5 hold - Seated Gastroc Stretch with Strap (Mirrored)  - 2-3 x daily - 7 x weekly - 1 sets - 3 reps - 30 hold - Long Sitting Eccentric Ankle Plantar Flexion with Resistance  - 1-2 x daily - 7 x weekly - 3 sets - 10 reps  ASSESSMENT:  CLINICAL IMPRESSION: She continues to have pain but able to complete her knee and ankle exercises. We will try aquatic PT next session with her.s     OBJECTIVE IMPAIRMENTS: Abnormal gait, decreased activity tolerance, decreased balance, decreased coordination, decreased endurance, decreased mobility, difficulty walking, decreased ROM, decreased strength, hypomobility, increased edema, impaired perceived functional ability, impaired flexibility, improper body mechanics, postural dysfunction, and pain.   ACTIVITY LIMITATIONS: carrying, lifting, bending, sitting, standing, squatting, sleeping, stairs, transfers, bed mobility, bathing, toileting, dressing, and locomotion level  PARTICIPATION LIMITATIONS: meal prep, cleaning, laundry, interpersonal relationship, driving, shopping, and community activity  PERSONAL FACTORS:  History of 05/2022 lumbar surgery, GERD, COPD, HTN, history of arthritis, multiple treatment areas current  are also affecting patient's functional outcome.   REHAB POTENTIAL: Fair to good  CLINICAL DECISION MAKING: Evolving/moderate complexity  EVALUATION COMPLEXITY: Moderate   GOALS: Goals reviewed with patient? Yes  SHORT TERM GOALS: (target date for Short term goals are 3 weeks 03/09/2023)   1.  Patient will demonstrate independent use of home exercise program to maintain progress from in clinic treatments.  Goal status: on going 03/02/2023  LONG TERM GOALS: (target dates for all long term goals are 10 weeks  04/27/2023 )   1. Patient will demonstrate/report pain at worst less than or equal to 2/10 to facilitate minimal limitation in daily activity  secondary to pain symptoms.  Goal status: on going 03/02/2023   2. Patient will demonstrate independent use of home exercise program to facilitate ability to maintain/progress functional gains from skilled physical therapy services.  Goal status: on going 03/02/2023   3. Patient will demonstrate FOTO outcome > or = 54 % to indicate reduced disability due to condition.  Goal status: on going 03/02/2023   4.  Patient will demonstrate Lt LE MMT 5/5 throughout  to faciltiate usual transfers, stairs, squatting at [facility] for daily life.   Goal status: on going 03/02/2023   5.  Patient will demonstrate ability to ascend/descend stairs reciprocal gait pattern with single hand rail assist.  Goal status: on going 03/02/2023   6.  Patient will demonstrate Lt knee AROM 0-120 deg to facilitate ability to perform transfers, ambulation and other activity in daily life.  Goal status: on going 03/02/2023   7.  Patient will demonstrate ability to ambulation community distances > 500 ft.  Goal Status: on going 03/02/2023   PLAN:  PT FREQUENCY: 1-2x/week  PT DURATION: 10 weeks  PLANNED INTERVENTIONS: Therapeutic exercises, Therapeutic activity, Neuro Muscular re-education, Balance training, Gait training, Patient/Family education, Joint mobilization, Stair training, DME instructions, Dry Needling, Electrical stimulation, Traction, Cryotherapy, vasopneumatic deviceMoist heat, Taping, Ultrasound, Ionotophoresis 4mg /ml Dexamethasone, and aquatic therapy, Manual therapy.  All included unless contraindicated  PLAN FOR NEXT SESSION: Continue to address swelling in ROM/vaso.  Progressive strengthening as able with ankle and knee mobility gains.    Ivery Quale, PT, DPT 03/06/23 11:44 AM

## 2023-03-09 ENCOUNTER — Encounter: Payer: Self-pay | Admitting: Physical Therapy

## 2023-03-09 ENCOUNTER — Ambulatory Visit (INDEPENDENT_AMBULATORY_CARE_PROVIDER_SITE_OTHER): Payer: Medicare Other | Admitting: Physical Therapy

## 2023-03-09 DIAGNOSIS — M6281 Muscle weakness (generalized): Secondary | ICD-10-CM | POA: Diagnosis not present

## 2023-03-09 DIAGNOSIS — R262 Difficulty in walking, not elsewhere classified: Secondary | ICD-10-CM | POA: Diagnosis not present

## 2023-03-09 DIAGNOSIS — R600 Localized edema: Secondary | ICD-10-CM

## 2023-03-09 DIAGNOSIS — M25562 Pain in left knee: Secondary | ICD-10-CM

## 2023-03-09 DIAGNOSIS — G8929 Other chronic pain: Secondary | ICD-10-CM

## 2023-03-09 DIAGNOSIS — M79662 Pain in left lower leg: Secondary | ICD-10-CM

## 2023-03-09 NOTE — Therapy (Signed)
OUTPATIENT PHYSICAL THERAPY TREATMENT   Patient Name: Frances Nelson MRN: [id number] DOB:[birth year], 67 y.o., female Today's Date: 03/09/2023  END OF SESSION:  PT End of Session - 03/09/23 1101     Visit Number 7    Number of Visits 20    Date for PT Re-Evaluation 04/27/23    Authorization Type MCR    Progress Note Due on Visit 10    PT Start Time 1100    PT Stop Time 1140    PT Time Calculation (min) 40 min    Activity Tolerance Patient limited by pain    Behavior During Therapy WFL for tasks assessed/performed                 Past Medical History:  Diagnosis Date   Arthritis    Asthma    COPD (chronic obstructive pulmonary disease) (HCC)    GERD (gastroesophageal reflux disease)    Hypertension    Pneumonia    Past Surgical History:  Procedure Laterality Date   ABDOMINAL HYSTERECTOMY     APPENDECTOMY     HERNIA REPAIR     Umbilical x 3   TONSILLECTOMY     TRANSFORAMINAL LUMBAR INTERBODY FUSION (TLIF) WITH PEDICLE SCREW FIXATION 1 LEVEL N/A 05/18/2022   Procedure: Lumbar Four-Five Open Laminectomy/Transforaminal Lumbar Interbody Fusion/Posterolateral fusion;  Surgeon: Jadene Pierini, MD;  Location: [facility] OR;  Service: Neurosurgery;  Laterality: N/A;   Patient Active Problem List   Diagnosis Date Noted   Urethritis 10/05/2022   External otitis of left ear 10/05/2022   Spondylolisthesis of lumbar region 05/18/2022   Smoker 04/08/2022   Deviated nasal septum 04/08/2022   Bilateral pain of leg and foot 04/08/2022   Follicular acne 04/06/2022   Baker cyst, left 04/06/2022   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   Depression 10/04/2021   Left knee pain 10/04/2021   Chronic pain 04/09/2021   Pelvic pain 04/09/2021   HLD (hyperlipidemia) 04/09/2021   Vitamin D deficiency 04/09/2021   Estrogen deficiency 04/09/2021   Hyperglycemia 04/01/2021   Chronic cough 12/30/2020   GERD (gastroesophageal reflux disease) 11/06/2019    Bilateral leg edema 10/16/2019   Pleuritic chest pain 10/16/2019   Hypertension 10/02/2019   Arthralgia 06/28/2018   Sinusitis 06/28/2018   Renal cyst 05/03/2018   Syncope 05/03/2018   Stress and adjustment reaction 05/03/2018   History of tobacco use 05/02/2018   Primary osteoarthritis of both hands 04/10/2018   Primary osteoarthritis of both knees 04/10/2018   Primary osteoarthritis of both feet 04/10/2018   Achilles tendinitis 04/02/2018   Screening for breast cancer 03/15/2018   Positive ANA (antinuclear antibody) 03/15/2018   Screening for colon cancer 03/15/2018   Rash 03/15/2018   Achilles tendon pain 11/13/2017   Cervical myelopathy with cervical radiculopathy (HCC) 11/13/2017   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   Allergic rhinitis 06/19/2017   Edema 12/24/2015   Carpal tunnel syndrome, right 12/16/2015   Hypercalcemia 11/11/2015   Pain in both upper extremities 11/11/2015   Drug reaction 10/28/2015   Muscle spasms of both lower extremities 10/28/2015   Nerve pain 10/28/2015   Chronic asthmatic bronchitis with acute exacerbation (HCC) 09/06/2015   Environmental and seasonal allergies 07/17/2014    PCP: Corwin Levins MD  REFERRING PROVIDER: Kathryne Hitch, MD  REFERRING DIAG: [phone] (ICD-10-CM) - Status post arthroscopic surgery of left knee M76.60 (ICD-10-CM) - Achilles tendon pain  THERAPY DIAG:  Chronic pain of left knee  Muscle weakness (generalized)  Pain in left lower leg  Difficulty in walking, not elsewhere classified  Localized edema  Rationale for Evaluation and Treatment: Rehabilitation  ONSET DATE: Surgery 12/21/2022 on Lt knee  SUBJECTIVE:   SUBJECTIVE STATEMENT: She relays pain is not horrible today overall. She has been in her pool some.   PERTINENT HISTORY: History of 05/2022 lumbar surgery, GERD, COPD, HTN, history of arthritis  PAIN:  NPRS scale: knee 5/10 overall today Pain location: Lt knee, Lt achilles Pain  description: constant, throbbing,  Aggravating factors: sitting, prolonged WB activity, walking, bending, squatting, stairs Relieving factors: nothing specific   PRECAUTIONS: None  WEIGHT BEARING RESTRICTIONS: No  FALLS:  Has patient fallen in last 6 months? 1 fall without medical attention.   LIVING ENVIRONMENT: Lives in: House/apartment Stairs: multistory with bedroom upstairs but has moved bed downstairs.   Rail on Lt side going up.   OCCUPATION: Retired   PLOF: Independent, walking for exercise (5-6 miles/day in the past until 2012).  Dog walking   PATIENT GOALS: Reduce pain, move better, walk better without cane.   OBJECTIVE: (objective measures completed at initial evaluation unless otherwise dated)   PATIENT SURVEYS:  02/16/2023 FOTO intake:  38  predicted:  54  COGNITION: 02/16/2023 Overall cognitive status: WFL    SENSATION: 02/16/2023 No specific check today  EDEMA:  02/16/2023 Lt leg edema noted from knee down compared to Lt.   MUSCLE LENGTH: 02/16/2023 No specific testing  POSTURE:  02/16/2023 Reduced lumbar lordosis in standing, Weight shift off Lt leg to Rt.   PALPATION: 02/16/2023 Tenderness around swollen Lt ankle, Lt knee to light touch.   LOWER EXTREMITY ROM:   ROM Right 02/16/2023 Left 02/16/2023 Left 02/21/2023 Left 03/02/2023  Hip flexion      Hip extension      Hip abduction      Hip adduction      Hip internal rotation      Hip external rotation      Knee flexion 125 92 AROM in supine heel slide c pain PROM 95/100 grossly assesed in sitting.  90 AROM in supine heel slide c pain  Knee extension 0 0 AROM in quad set supine  0 AROM in seated quad set  Ankle dorsiflexion      Ankle plantarflexion      Ankle inversion      Ankle eversion       (Blank rows = not tested)  LOWER EXTREMITY MMT:  MMT Right 02/16/2023 Left 02/16/2023  Hip flexion 5/5 4/5  Hip extension    Hip abduction    Hip adduction    Hip internal rotation    Hip external  rotation    Knee flexion 5/5 4/5  Knee extension 4/5 3+/5  Ankle dorsiflexion 5/5 3+/5 c pain  Ankle plantarflexion  2+/5 c pain (unable to perform standing PF)  Ankle inversion 5/5 3+/5 c pain  Ankle eversion 5/5 3+/5 c pain   (Blank rows = not tested)  LOWER EXTREMITY SPECIAL TESTS:  02/16/2023 No specific testing today.   FUNCTIONAL TESTS:  02/16/2023 18 inch chair transfer: multiple tries required c noted deviation to Rt leg Lt SLS:  < 3 seconds Rt SLS: < 3 seconds  GAIT: 02/16/2023 Mod independent ambulation c SPC in Rt UE, decreased stance on Lt leg, decreased toe off progression noted.  TODAY'S TREATMENT                                                                                                                                                                                                      03/09/23 Pt seen for aquatic therapy today.  Treatment took place in water 3.5-4.75 ft in depth at the [facility] pool. Temp of water was 91.  Pt entered/exited the pool via stairs with hand rail.   Pt requires the buoyancy and hydrostatic pressure of water for support, and to offload joints by unweighting joint load by at least 50 % in navel deep water and by at least 75-80% in chest to neck deep water.  Viscosity of the water is needed for resistance of strengthening. Water current perturbations provides challenge to standing balance requiring increased core activation. Aquatic PT exercises Sidestepping length of pool 3 round trips with mini squat Forward walking  3 round trips backward walking 3 round trips March walking 3 round trips Tandem walk 3 round trips Leg swings hip abd/add X 20 bilat with UE support with UE support Leg swings hip flexion/extension X 20 bilat Gastroc stretch 30 sec X 3 off of first  step Heel and toe raises 2X15 with UE support off of first step Squats 2X15 with UE support off of first step Hip circles CW and CCW X 15 each bilat 1/4 squat with shoulder extension X15 with kickboard 1/4 squat with back against wall, push/pull kickboard 2X15 Seated on pool chair and cycling 2 min   Water PT HEP Exercises printed and laminated for her  DATE: 03/06/2023 Therapeutic Exercise: Nustep lvl 5 UE/LE 10 mins  Incline calf stretch 30 sec x 5  Seated Lt LAQ 2 sec stretch in end ranges x 15  Supine Lt quad set 5 sec hold x 10  Supine Lt quad set c SLR 2 x 10  Lt ankle PF green band 2 x 10 Seated leg press green 2X10  Modalities: Vaso Lt knee/ankle combo, medium compression 34 deg , good relief with no adverse events; supine w/ LE elevation DATE: 03/02/2023 Therapeutic Exercise: Nustep lvl 5 UE/LE 10 mins  Incline calf stretch 30 sec x 5  Seated Lt LAQ 2 sec stretch in end ranges x 15  Supine Lt quad set 5 sec hold x 10  Supine Lt quad set c SLR 2 x 10  Lt ankle PF green band 2 x 10 Supine LAQ Lt knee in 90 deg hip flexion x 15 c strap holding leg  Modalities: Vaso Lt knee/ankle combo, medium compression 34  deg , good relief with no adverse events; supine w/ LE elevation  DATE: 02/27/23 Therapeutic Exercise: Quad set with heel prop x10 cues for quad contraction Seated march 2x10 cues for breath control and trunk mechanics Seated quad set x10 LLE only cues for quad contraction Seated heel/toe raises x10 cues for comfortable ROM Seated gastroc stretch 3x30sec with strap cues for breath control    Manual Therapy: Supine knee flex/ext LLE to pt tolerance, limited by pain rather than stiffness, better tolerance noted to extension compared to extension  Modalities: Vaso L knee/ankle combo, medium compression 34 deg , good relief with no adverse events; supine w/ LE elevation  DATE: 02/21/2023 Therex: Nustep lvl 5 8 mins UE/LE  Seated Lt knee alternating  sub max/no pain isometric holds 5 sec for extension and flexion x 12 each way Supine elevated Lt gastroc stretch c strap 30 sec x 3 Lt leg PF green band with eccentric return focus 2 x 10   Manual: Seated Lt knee flexion c distraction/IR mobilization with movement.  Lt ankle overpressure DF stretch in sitting.   Vaso pneumatic  Lt ankle /knee combo medium compression 34 deg 10 mins  in elevation    PATIENT EDUCATION:  02/21/2023  Education details: HEP update Person educated: Patient Education method: Programmer, multimedia, Demonstration, Verbal cues, and Handouts Education comprehension: verbalized understanding, returned demonstration, and verbal cues required  HOME EXERCISE PROGRAM: Access Code: [email or web address] URL: [email or web address] Date: 02/21/2023 Prepared by: Chyrel Masson  Exercises - Supine Quadricep Sets  - 3-5 x daily - 7 x weekly - 1 sets - 10 reps - 5 hold - Supine Heel Slide (Mirrored)  - 3-5 x daily - 7 x weekly - 1-2 sets - 10 reps - 2 hold - Small Range Straight Leg Raise  - 1-2 x daily - 7 x weekly - 1-2 sets - 10-15 reps - Seated Long Arc Quad (Mirrored)  - 3-5 x daily - 7 x weekly - 1-2 sets - 10 reps - 2 hold - Seated Quad Set (Mirrored)  - 3-5 x daily - 7 x weekly - 1 sets - 10 reps - 5 hold - Seated Gastroc Stretch with Strap (Mirrored)  - 2-3 x daily - 7 x weekly - 1 sets - 3 reps - 30 hold - Long Sitting Eccentric Ankle Plantar Flexion with Resistance  - 1-2 x daily - 7 x weekly - 3 sets - 10 reps  ASSESSMENT:  CLINICAL IMPRESSION: She had good tolerance to her first aquatic PT session. I did print out and laminate water HEP for her as well. PT recommending to continue with current plan.     OBJECTIVE IMPAIRMENTS: Abnormal gait, decreased activity tolerance, decreased balance, decreased coordination, decreased endurance, decreased mobility, difficulty walking, decreased ROM, decreased strength, hypomobility, increased edema, impaired perceived  functional ability, impaired flexibility, improper body mechanics, postural dysfunction, and pain.   ACTIVITY LIMITATIONS: carrying, lifting, bending, sitting, standing, squatting, sleeping, stairs, transfers, bed mobility, bathing, toileting, dressing, and locomotion level  PARTICIPATION LIMITATIONS: meal prep, cleaning, laundry, interpersonal relationship, driving, shopping, and community activity  PERSONAL FACTORS:  History of 05/2022 lumbar surgery, GERD, COPD, HTN, history of arthritis, multiple treatment areas current  are also affecting patient's functional outcome.   REHAB POTENTIAL: Fair to good  CLINICAL DECISION MAKING: Evolving/moderate complexity  EVALUATION COMPLEXITY: Moderate   GOALS: Goals reviewed with patient? Yes  SHORT TERM GOALS: (target date for Short term goals are 3 weeks 03/09/2023)   1.  Patient will demonstrate independent  use of home exercise program to maintain progress from in clinic treatments.  Goal status: on going 03/02/2023  LONG TERM GOALS: (target dates for all long term goals are 10 weeks  04/27/2023 )   1. Patient will demonstrate/report pain at worst less than or equal to 2/10 to facilitate minimal limitation in daily activity secondary to pain symptoms.  Goal status: on going 03/02/2023   2. Patient will demonstrate independent use of home exercise program to facilitate ability to maintain/progress functional gains from skilled physical therapy services.  Goal status: on going 03/02/2023   3. Patient will demonstrate FOTO outcome > or = 54 % to indicate reduced disability due to condition.  Goal status: on going 03/02/2023   4.  Patient will demonstrate Lt LE MMT 5/5 throughout to faciltiate usual transfers, stairs, squatting at [facility] for daily life.   Goal status: on going 03/02/2023   5.  Patient will demonstrate ability to ascend/descend stairs reciprocal gait pattern with single hand rail assist.  Goal status: on going 03/02/2023   6.   Patient will demonstrate Lt knee AROM 0-120 deg to facilitate ability to perform transfers, ambulation and other activity in daily life.  Goal status: on going 03/02/2023   7.  Patient will demonstrate ability to ambulation community distances > 500 ft.  Goal Status: on going 03/02/2023   PLAN:  PT FREQUENCY: 1-2x/week  PT DURATION: 10 weeks  PLANNED INTERVENTIONS: Therapeutic exercises, Therapeutic activity, Neuro Muscular re-education, Balance training, Gait training, Patient/Family education, Joint mobilization, Stair training, DME instructions, Dry Needling, Electrical stimulation, Traction, Cryotherapy, vasopneumatic deviceMoist heat, Taping, Ultrasound, Ionotophoresis 4mg /ml Dexamethasone, and aquatic therapy, Manual therapy.  All included unless contraindicated  PLAN FOR NEXT SESSION: Continue to address swelling in ROM/vaso.  Progressive strengthening as able with ankle and knee mobility gains.    Ivery Quale, PT, DPT 03/09/23 11:02 AM

## 2023-03-12 ENCOUNTER — Other Ambulatory Visit: Payer: Self-pay

## 2023-03-12 ENCOUNTER — Encounter: Payer: Self-pay | Admitting: Physician Assistant

## 2023-03-12 ENCOUNTER — Ambulatory Visit (INDEPENDENT_AMBULATORY_CARE_PROVIDER_SITE_OTHER): Payer: Medicare Other | Admitting: Physician Assistant

## 2023-03-12 DIAGNOSIS — R2242 Localized swelling, mass and lump, left lower limb: Secondary | ICD-10-CM

## 2023-03-12 DIAGNOSIS — Z9889 Other specified postprocedural states: Secondary | ICD-10-CM

## 2023-03-12 NOTE — Progress Notes (Signed)
HPI: Frances Nelson returns today status post left knee arthroscopy 12/21/2022.  She states that after undergoing the injection on 02/12/2023 and going to therapy that she feels like she has had some improvement.  Main complaint today though is soft tissue mass that she feels is grown significantly since December over the anterior aspect of the knee.  This is over the area of the insertion of the femoral patella tendon.  She states this is impeding her range of motion.  Physical exam: Left knee: Palpable nodule over the patella tendon near its insertion that is circular in shape and approximately 5 cm in diameter.  No abnormal warmth.  No erythema.  Slight tenderness to touch.  Knee otherwise with good range of motion.  No abnormal warmth erythema or effusion left knee.  Impression: Status post left knee arthroscopy 12/21/2022 Left knee soft tissue mass   Plan: Will obtain an MRI with and without contrast to evaluate the left soft tissue mass.  She will continue therapy for her knee.  Follow-up once she is undergoing the MRI to go over results and discuss further treatment.  Questions were encouraged and answered.

## 2023-03-13 ENCOUNTER — Encounter: Payer: Self-pay | Admitting: Rehabilitative and Restorative Service Providers"

## 2023-03-13 ENCOUNTER — Ambulatory Visit (INDEPENDENT_AMBULATORY_CARE_PROVIDER_SITE_OTHER): Payer: Medicare Other | Admitting: Rehabilitative and Restorative Service Providers"

## 2023-03-13 DIAGNOSIS — R262 Difficulty in walking, not elsewhere classified: Secondary | ICD-10-CM

## 2023-03-13 DIAGNOSIS — G8929 Other chronic pain: Secondary | ICD-10-CM

## 2023-03-13 DIAGNOSIS — M79662 Pain in left lower leg: Secondary | ICD-10-CM | POA: Diagnosis not present

## 2023-03-13 DIAGNOSIS — M25562 Pain in left knee: Secondary | ICD-10-CM | POA: Diagnosis not present

## 2023-03-13 DIAGNOSIS — M6281 Muscle weakness (generalized): Secondary | ICD-10-CM | POA: Diagnosis not present

## 2023-03-13 DIAGNOSIS — R600 Localized edema: Secondary | ICD-10-CM

## 2023-03-13 NOTE — Therapy (Signed)
OUTPATIENT PHYSICAL THERAPY TREATMENT   Patient Name: Frances Nelson MRN: [id number] DOB:02-Aug-1955, 67 y.o., female Today's Date: 03/13/2023  END OF SESSION:  PT End of Session - 03/13/23 1101     Visit Number 8    Number of Visits 20    Date for PT Re-Evaluation 04/27/23    Authorization Type MCR    Progress Note Due on Visit 10    PT Start Time 1100    PT Stop Time 1149    PT Time Calculation (min) 49 min    Activity Tolerance Patient limited by pain    Behavior During Therapy WFL for tasks assessed/performed                  Past Medical History:  Diagnosis Date   Arthritis    Asthma    COPD (chronic obstructive pulmonary disease) (HCC)    GERD (gastroesophageal reflux disease)    Hypertension    Pneumonia    Past Surgical History:  Procedure Laterality Date   ABDOMINAL HYSTERECTOMY     APPENDECTOMY     HERNIA REPAIR     Umbilical x 3   TONSILLECTOMY     TRANSFORAMINAL LUMBAR INTERBODY FUSION (TLIF) WITH PEDICLE SCREW FIXATION 1 LEVEL N/A 05/18/2022   Procedure: Lumbar Four-Five Open Laminectomy/Transforaminal Lumbar Interbody Fusion/Posterolateral fusion;  Surgeon: Jadene Pierini, MD;  Location: [facility] OR;  Service: Neurosurgery;  Laterality: N/A;   Patient Active Problem List   Diagnosis Date Noted   Urethritis 10/05/2022   External otitis of left ear 10/05/2022   Spondylolisthesis of lumbar region 05/18/2022   Smoker 04/08/2022   Deviated nasal septum 04/08/2022   Bilateral pain of leg and foot 04/08/2022   Follicular acne 04/06/2022   Baker cyst, left 04/06/2022   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   Depression 10/04/2021   Left knee pain 10/04/2021   Chronic pain 04/09/2021   Pelvic pain 04/09/2021   HLD (hyperlipidemia) 04/09/2021   Vitamin D deficiency 04/09/2021   Estrogen deficiency 04/09/2021   Hyperglycemia 04/01/2021   Chronic cough 12/30/2020   GERD (gastroesophageal reflux disease)  11/06/2019   Bilateral leg edema 10/16/2019   Pleuritic chest pain 10/16/2019   Hypertension 10/02/2019   Arthralgia 06/28/2018   Sinusitis 06/28/2018   Renal cyst 05/03/2018   Syncope 05/03/2018   Stress and adjustment reaction 05/03/2018   History of tobacco use 05/02/2018   Primary osteoarthritis of both hands 04/10/2018   Primary osteoarthritis of both knees 04/10/2018   Primary osteoarthritis of both feet 04/10/2018   Achilles tendinitis 04/02/2018   Screening for breast cancer 03/15/2018   Positive ANA (antinuclear antibody) 03/15/2018   Screening for colon cancer 03/15/2018   Rash 03/15/2018   Achilles tendon pain 11/13/2017   Cervical myelopathy with cervical radiculopathy (HCC) 11/13/2017   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   Allergic rhinitis 06/19/2017   Edema 12/24/2015   Carpal tunnel syndrome, right 12/16/2015   Hypercalcemia 11/11/2015   Pain in both upper extremities 11/11/2015   Drug reaction 10/28/2015   Muscle spasms of both lower extremities 10/28/2015   Nerve pain 10/28/2015   Chronic asthmatic bronchitis with acute exacerbation (HCC) 09/06/2015   Environmental and seasonal allergies 07/17/2014    PCP: Corwin Levins MD  REFERRING PROVIDER: Kathryne Hitch, MD  REFERRING DIAG: [phone] (ICD-10-CM) - Status post arthroscopic surgery of left knee M76.60 (ICD-10-CM) - Achilles tendon pain  THERAPY DIAG:  Chronic pain of left knee  Pain in  left lower leg  Muscle weakness (generalized)  Difficulty in walking, not elsewhere classified  Localized edema  Rationale for Evaluation and Treatment: Rehabilitation  ONSET DATE: Surgery 12/21/2022 on Lt knee  SUBJECTIVE:   SUBJECTIVE STATEMENT: Reported complaints in Lt ankle with walking today.  Also reported back and hip pain some last few days.    Will have imaging on lump on knee.   PERTINENT HISTORY: History of 05/2022 lumbar surgery, GERD, COPD, HTN, history of arthritis  PAIN:  NPRS scale:  Lt ankle: 6/10  Lt knee:  5/10 Pain location: Lt knee, Lt achilles Pain description: constant, throbbing,  Aggravating factors: sitting, prolonged WB activity, walking, bending, squatting, stairs Relieving factors: nothing specific   PRECAUTIONS: None  WEIGHT BEARING RESTRICTIONS: No  FALLS:  Has patient fallen in last 6 months? 1 fall without medical attention.   LIVING ENVIRONMENT: Lives in: House/apartment Stairs: multistory with bedroom upstairs but has moved bed downstairs.   Rail on Lt side going up.   OCCUPATION: Retired   PLOF: Independent, walking for exercise (5-6 miles/day in the past until 2012).  Dog walking   PATIENT GOALS: Reduce pain, move better, walk better without cane.   OBJECTIVE: (objective measures completed at initial evaluation unless otherwise dated)   PATIENT SURVEYS:  02/16/2023 FOTO intake:  38  predicted:  54  COGNITION: 02/16/2023 Overall cognitive status: WFL    SENSATION: 02/16/2023 No specific check today  EDEMA:  02/16/2023 Lt leg edema noted from knee down compared to Lt.   MUSCLE LENGTH: 02/16/2023 No specific testing  POSTURE:  02/16/2023 Reduced lumbar lordosis in standing, Weight shift off Lt leg to Rt.   PALPATION: 02/16/2023 Tenderness around swollen Lt ankle, Lt knee to light touch.   LOWER EXTREMITY ROM:   ROM Right 02/16/2023 Left 02/16/2023 Left 02/21/2023 Left 03/02/2023  Hip flexion      Hip extension      Hip abduction      Hip adduction      Hip internal rotation      Hip external rotation      Knee flexion 125 92 AROM in supine heel slide c pain PROM 95/100 grossly assesed in sitting.  90 AROM in supine heel slide c pain  Knee extension 0 0 AROM in quad set supine  0 AROM in seated quad set  Ankle dorsiflexion      Ankle plantarflexion      Ankle inversion      Ankle eversion       (Blank rows = not tested)  LOWER EXTREMITY MMT:  MMT Right 02/16/2023 Left 02/16/2023 Left 03/13/2023  Hip flexion 5/5 4/5   Hip  extension     Hip abduction     Hip adduction     Hip internal rotation     Hip external rotation     Knee flexion 5/5 4/5 4/5  Knee extension 4/5 3+/5 4-/5  Ankle dorsiflexion 5/5 3+/5 c pain 4/5  Ankle plantarflexion  2+/5 c pain (unable to perform standing PF) 2+/5 c pain (unable to perform standing PF)  Ankle inversion 5/5 3+/5 c pain 4/5 c pain  Ankle eversion 5/5 3+/5 c pain 3+/5 c pain   (Blank rows = not tested)  LOWER EXTREMITY SPECIAL TESTS:  02/16/2023 No specific testing today.   FUNCTIONAL TESTS:  02/16/2023 18 inch chair transfer: multiple tries required c noted deviation to Rt leg Lt SLS:  < 3 seconds Rt SLS: < 3 seconds  GAIT: 02/16/2023 Mod independent  ambulation c SPC in Rt UE, decreased stance on Lt leg, decreased toe off progression noted.                                                                                                                                                                         TODAY'S TREATMENT                                                                             DATE:  03/13/2023 Therapeutic Exercise: Seated Lt LAQ 2 sec stretch in end ranges 2 x 15 2 lbs  Seated Lt ankle fitter board DF/PF x 15 Seated bilateral PF with slow lowering x 15 Supine elevated Lt ankle green band x 20 df, pf, inversion, eversion each  Neuro Re-ed Tandem stance 30 seconds x 3 bilateral with occasional HHA  Modalities: Vaso Lt knee/ankle combo, medium compression 34 deg , supine w/ LE elevation  TODAY'S TREATMENT                                                                             DATE: 03/09/23 Pt seen for aquatic therapy today.  Treatment took place in water 3.5-4.75 ft in depth at the [facility] pool. Temp of water was 91.  Pt entered/exited the pool via stairs with hand rail.   Pt requires the buoyancy and hydrostatic pressure of water for support, and to offload joints by unweighting joint load by at least 50 % in navel  deep water and by at least 75-80% in chest to neck deep water.  Viscosity of the water is needed for resistance of strengthening. Water current perturbations provides challenge to standing balance requiring increased core activation. Aquatic PT exercises Sidestepping length of pool 3 round trips with mini squat Forward walking  3 round trips backward walking 3 round trips March walking 3 round trips Tandem walk 3 round trips Leg swings hip abd/add X 20 bilat with UE support with UE support Leg swings hip flexion/extension X 20 bilat Gastroc stretch 30 sec X 3 off of first step Heel and toe raises 2X15 with UE support off of first step Squats 2X15 with  UE support off of first step Hip circles CW and CCW X 15 each bilat 1/4 squat with shoulder extension X15 with kickboard 1/4 squat with back against wall, push/pull kickboard 2X15 Seated on pool chair and cycling 2 min   Water PT HEP Exercises printed and laminated for her  TODAY'S TREATMENT                                                                             DATE:  03/06/2023 Therapeutic Exercise: Nustep lvl 5 UE/LE 10 mins  Incline calf stretch 30 sec x 5  Seated Lt LAQ 2 sec stretch in end ranges x 15  Supine Lt quad set 5 sec hold x 10  Supine Lt quad set c SLR 2 x 10  Lt ankle PF green band 2 x 10 Seated leg press green 2X10  Modalities: Vaso Lt knee/ankle combo, medium compression 34 deg , good relief with no adverse events; supine w/ LE elevation  TODAY'S TREATMENT                                                                             DATE:  03/02/2023 Therapeutic Exercise: Nustep lvl 5 UE/LE 10 mins  Incline calf stretch 30 sec x 5  Seated Lt LAQ 2 sec stretch in end ranges x 15  Supine Lt quad set 5 sec hold x 10  Supine Lt quad set c SLR 2 x 10  Lt ankle PF green band 2 x 10 Supine LAQ Lt knee in 90 deg hip flexion x 15 c strap holding leg  Modalities: Vaso Lt knee/ankle combo, medium compression 34  deg , good relief with no adverse events; supine w/ LE elevation  PATIENT EDUCATION:  02/21/2023  Education details: HEP update Person educated: Patient Education method: Programmer, multimedia, Demonstration, Verbal cues, and Handouts Education comprehension: verbalized understanding, returned demonstration, and verbal cues required  HOME EXERCISE PROGRAM: Access Code: [email or web address] URL: [email or web address] Date: 02/21/2023 Prepared by: Chyrel Masson  Exercises - Supine Quadricep Sets  - 3-5 x daily - 7 x weekly - 1 sets - 10 reps - 5 hold - Supine Heel Slide (Mirrored)  - 3-5 x daily - 7 x weekly - 1-2 sets - 10 reps - 2 hold - Small Range Straight Leg Raise  - 1-2 x daily - 7 x weekly - 1-2 sets - 10-15 reps - Seated Long Arc Quad (Mirrored)  - 3-5 x daily - 7 x weekly - 1-2 sets - 10 reps - 2 hold - Seated Quad Set (Mirrored)  - 3-5 x daily - 7 x weekly - 1 sets - 10 reps - 5 hold - Seated Gastroc Stretch with Strap (Mirrored)  - 2-3 x daily - 7 x weekly - 1 sets - 3 reps - 30 hold - Long Sitting Eccentric Ankle Plantar Flexion with Resistance  - 1-2 x daily - 7 x weekly - 3 sets -  10 reps  ASSESSMENT:  CLINICAL IMPRESSION: Continued limitation in WB activity but introduced static tandem balance and some sitting based partial WB strengthening.  Continued symptoms throughout Lt LE noted that impairs functional activity.  Continued skilled PT services warranted at this time.    OBJECTIVE IMPAIRMENTS: Abnormal gait, decreased activity tolerance, decreased balance, decreased coordination, decreased endurance, decreased mobility, difficulty walking, decreased ROM, decreased strength, hypomobility, increased edema, impaired perceived functional ability, impaired flexibility, improper body mechanics, postural dysfunction, and pain.   ACTIVITY LIMITATIONS: carrying, lifting, bending, sitting, standing, squatting, sleeping, stairs, transfers, bed mobility, bathing, toileting, dressing,  and locomotion level  PARTICIPATION LIMITATIONS: meal prep, cleaning, laundry, interpersonal relationship, driving, shopping, and community activity  PERSONAL FACTORS:  History of 05/2022 lumbar surgery, GERD, COPD, HTN, history of arthritis, multiple treatment areas current  are also affecting patient's functional outcome.   REHAB POTENTIAL: Fair to good  CLINICAL DECISION MAKING: Evolving/moderate complexity  EVALUATION COMPLEXITY: Moderate   GOALS: Goals reviewed with patient? Yes  SHORT TERM GOALS: (target date for Short term goals are 3 weeks 03/09/2023)   1.  Patient will demonstrate independent use of home exercise program to maintain progress from in clinic treatments.  Goal status: Met   LONG TERM GOALS: (target dates for all long term goals are 10 weeks  04/27/2023 )   1. Patient will demonstrate/report pain at worst less than or equal to 2/10 to facilitate minimal limitation in daily activity secondary to pain symptoms.  Goal status: on going 03/02/2023   2. Patient will demonstrate independent use of home exercise program to facilitate ability to maintain/progress functional gains from skilled physical therapy services.  Goal status: on going 03/02/2023   3. Patient will demonstrate FOTO outcome > or = 54 % to indicate reduced disability due to condition.  Goal status: on going 03/02/2023   4.  Patient will demonstrate Lt LE MMT 5/5 throughout to faciltiate usual transfers, stairs, squatting at [facility] for daily life.   Goal status: on going 03/02/2023   5.  Patient will demonstrate ability to ascend/descend stairs reciprocal gait pattern with single hand rail assist.  Goal status: on going 03/02/2023   6.  Patient will demonstrate Lt knee AROM 0-120 deg to facilitate ability to perform transfers, ambulation and other activity in daily life.  Goal status: on going 03/02/2023   7.  Patient will demonstrate ability to ambulation community distances > 500 ft.  Goal  Status: on going 03/02/2023   PLAN:  PT FREQUENCY: 1-2x/week  PT DURATION: 10 weeks  PLANNED INTERVENTIONS: Therapeutic exercises, Therapeutic activity, Neuro Muscular re-education, Balance training, Gait training, Patient/Family education, Joint mobilization, Stair training, DME instructions, Dry Needling, Electrical stimulation, Traction, Cryotherapy, vasopneumatic deviceMoist heat, Taping, Ultrasound, Ionotophoresis 4mg /ml Dexamethasone, and aquatic therapy, Manual therapy.  All included unless contraindicated  PLAN FOR NEXT SESSION: WB strengthening as tolerated (possible Leg press use, static balance).     Chyrel Masson, PT, DPT, OCS, ATC 03/13/23  11:44 AM

## 2023-03-15 ENCOUNTER — Encounter: Payer: Medicare Other | Admitting: Rehabilitative and Restorative Service Providers"

## 2023-03-15 ENCOUNTER — Telehealth: Payer: Self-pay | Admitting: Orthopaedic Surgery

## 2023-03-15 NOTE — Telephone Encounter (Signed)
Order has been changed to W/WO contrast as noted on ov notes.

## 2023-03-15 NOTE — Telephone Encounter (Signed)
[facility] called requesting provider to resubmit if he wants the MRI exam w/o contrast only or if he wants exam with & w/o contrast. Imaging stated the exam with contrast only is not an option. Any further questions call [phone], select option 1, then option 3.

## 2023-03-23 ENCOUNTER — Encounter: Payer: Self-pay | Admitting: Physical Therapy

## 2023-03-23 ENCOUNTER — Ambulatory Visit (INDEPENDENT_AMBULATORY_CARE_PROVIDER_SITE_OTHER): Payer: Medicare Other | Admitting: Physical Therapy

## 2023-03-23 ENCOUNTER — Ambulatory Visit: Payer: Medicare Other | Admitting: Physical Therapy

## 2023-03-23 DIAGNOSIS — M6281 Muscle weakness (generalized): Secondary | ICD-10-CM

## 2023-03-23 DIAGNOSIS — M79662 Pain in left lower leg: Secondary | ICD-10-CM

## 2023-03-23 DIAGNOSIS — G8929 Other chronic pain: Secondary | ICD-10-CM

## 2023-03-23 DIAGNOSIS — M25562 Pain in left knee: Secondary | ICD-10-CM

## 2023-03-23 DIAGNOSIS — R600 Localized edema: Secondary | ICD-10-CM

## 2023-03-23 NOTE — Therapy (Signed)
OUTPATIENT PHYSICAL THERAPY TREATMENT   Patient Name: Frances Nelson MRN: [id number] DOB:Dec 21, 1955, 67 y.o., female Today's Date: 03/23/2023  END OF SESSION:  PT End of Session - 03/23/23 1022     Visit Number 9    Number of Visits 20    Date for PT Re-Evaluation 04/27/23    Authorization Type MCR    Progress Note Due on Visit 10    PT Start Time 1020    PT Stop Time 1100    PT Time Calculation (min) 40 min    Activity Tolerance Patient tolerated treatment well    Behavior During Therapy WFL for tasks assessed/performed                  Past Medical History:  Diagnosis Date   Arthritis    Asthma    COPD (chronic obstructive pulmonary disease) (HCC)    GERD (gastroesophageal reflux disease)    Hypertension    Pneumonia    Past Surgical History:  Procedure Laterality Date   ABDOMINAL HYSTERECTOMY     APPENDECTOMY     HERNIA REPAIR     Umbilical x 3   TONSILLECTOMY     TRANSFORAMINAL LUMBAR INTERBODY FUSION (TLIF) WITH PEDICLE SCREW FIXATION 1 LEVEL N/A 05/18/2022   Procedure: Lumbar Four-Five Open Laminectomy/Transforaminal Lumbar Interbody Fusion/Posterolateral fusion;  Surgeon: Jadene Pierini, MD;  Location: [facility] OR;  Service: Neurosurgery;  Laterality: N/A;   Patient Active Problem List   Diagnosis Date Noted   Urethritis 10/05/2022   External otitis of left ear 10/05/2022   Spondylolisthesis of lumbar region 05/18/2022   Smoker 04/08/2022   Deviated nasal septum 04/08/2022   Bilateral pain of leg and foot 04/08/2022   Follicular acne 04/06/2022   Baker cyst, left 04/06/2022   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   Depression 10/04/2021   Left knee pain 10/04/2021   Chronic pain 04/09/2021   Pelvic pain 04/09/2021   HLD (hyperlipidemia) 04/09/2021   Vitamin D deficiency 04/09/2021   Estrogen deficiency 04/09/2021   Hyperglycemia 04/01/2021   Chronic cough 12/30/2020   GERD (gastroesophageal reflux disease)  11/06/2019   Bilateral leg edema 10/16/2019   Pleuritic chest pain 10/16/2019   Hypertension 10/02/2019   Arthralgia 06/28/2018   Sinusitis 06/28/2018   Renal cyst 05/03/2018   Syncope 05/03/2018   Stress and adjustment reaction 05/03/2018   History of tobacco use 05/02/2018   Primary osteoarthritis of both hands 04/10/2018   Primary osteoarthritis of both knees 04/10/2018   Primary osteoarthritis of both feet 04/10/2018   Achilles tendinitis 04/02/2018   Screening for breast cancer 03/15/2018   Positive ANA (antinuclear antibody) 03/15/2018   Screening for colon cancer 03/15/2018   Rash 03/15/2018   Achilles tendon pain 11/13/2017   Cervical myelopathy with cervical radiculopathy (HCC) 11/13/2017   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   Allergic rhinitis 06/19/2017   Edema 12/24/2015   Carpal tunnel syndrome, right 12/16/2015   Hypercalcemia 11/11/2015   Pain in both upper extremities 11/11/2015   Drug reaction 10/28/2015   Muscle spasms of both lower extremities 10/28/2015   Nerve pain 10/28/2015   Chronic asthmatic bronchitis with acute exacerbation (HCC) 09/06/2015   Environmental and seasonal allergies 07/17/2014    PCP: Corwin Levins MD  REFERRING PROVIDER: Kathryne Hitch, MD  REFERRING DIAG: [phone] (ICD-10-CM) - Status post arthroscopic surgery of left knee M76.60 (ICD-10-CM) - Achilles tendon pain  THERAPY DIAG:  Chronic pain of left knee  Pain in  left lower leg  Muscle weakness (generalized)  Localized edema  Rationale for Evaluation and Treatment: Rehabilitation  ONSET DATE: Surgery 12/21/2022 on Lt knee  SUBJECTIVE:   SUBJECTIVE STATEMENT: He knee is not bad, but her ankle and hip are killing her PERTINENT HISTORY: History of 05/2022 lumbar surgery, GERD, COPD, HTN, history of arthritis  PAIN:  NPRS scale: Lt ankle: 7/10  Lt knee:  2/10, Left hip 8-9 Pain location: Lt knee, Lt achilles Pain description: constant, throbbing,  Aggravating  factors: sitting, prolonged WB activity, walking, bending, squatting, stairs Relieving factors: nothing specific   PRECAUTIONS: None  WEIGHT BEARING RESTRICTIONS: No  FALLS:  Has patient fallen in last 6 months? 1 fall without medical attention.   LIVING ENVIRONMENT: Lives in: House/apartment Stairs: multistory with bedroom upstairs but has moved bed downstairs.   Rail on Lt side going up.   OCCUPATION: Retired   PLOF: Independent, walking for exercise (5-6 miles/day in the past until 2012).  Dog walking   PATIENT GOALS: Reduce pain, move better, walk better without cane.   OBJECTIVE: (objective measures completed at initial evaluation unless otherwise dated)   PATIENT SURVEYS:  02/16/2023 FOTO intake:  38  predicted:  54  COGNITION: 02/16/2023 Overall cognitive status: WFL    SENSATION: 02/16/2023 No specific check today  EDEMA:  02/16/2023 Lt leg edema noted from knee down compared to Lt.   MUSCLE LENGTH: 02/16/2023 No specific testing  POSTURE:  02/16/2023 Reduced lumbar lordosis in standing, Weight shift off Lt leg to Rt.   PALPATION: 02/16/2023 Tenderness around swollen Lt ankle, Lt knee to light touch.   LOWER EXTREMITY ROM:   ROM Right 02/16/2023 Left 02/16/2023 Left 02/21/2023 Left 03/02/2023  Hip flexion      Hip extension      Hip abduction      Hip adduction      Hip internal rotation      Hip external rotation      Knee flexion 125 92 AROM in supine heel slide c pain PROM 95/100 grossly assesed in sitting.  90 AROM in supine heel slide c pain  Knee extension 0 0 AROM in quad set supine  0 AROM in seated quad set  Ankle dorsiflexion      Ankle plantarflexion      Ankle inversion      Ankle eversion       (Blank rows = not tested)  LOWER EXTREMITY MMT:  MMT Right 02/16/2023 Left 02/16/2023 Left 03/13/2023  Hip flexion 5/5 4/5   Hip extension     Hip abduction     Hip adduction     Hip internal rotation     Hip external rotation     Knee flexion 5/5  4/5 4/5  Knee extension 4/5 3+/5 4-/5  Ankle dorsiflexion 5/5 3+/5 c pain 4/5  Ankle plantarflexion  2+/5 c pain (unable to perform standing PF) 2+/5 c pain (unable to perform standing PF)  Ankle inversion 5/5 3+/5 c pain 4/5 c pain  Ankle eversion 5/5 3+/5 c pain 3+/5 c pain   (Blank rows = not tested)  LOWER EXTREMITY SPECIAL TESTS:  02/16/2023 No specific testing today.   FUNCTIONAL TESTS:  02/16/2023 18 inch chair transfer: multiple tries required c noted deviation to Rt leg Lt SLS:  < 3 seconds Rt SLS: < 3 seconds  GAIT: 02/16/2023 Mod independent ambulation c SPC in Rt UE, decreased stance on Lt leg, decreased toe off progression noted.  TODAY'S TREATMENT                                                                             DATE: 03/23/23 Pt seen for aquatic therapy today.  Treatment took place in water 3.5-4.75 ft in depth at the [facility] pool. Temp of water was 91.  Pt entered/exited the pool via stairs with hand rail.   Pt requires the buoyancy and hydrostatic pressure of water for support, and to offload joints by unweighting joint load by at least 50 % in navel deep water and by at least 75-80% in chest to neck deep water.  Viscosity of the water is needed for resistance of strengthening. Water current perturbations provides challenge to standing balance requiring increased core activation. Aquatic PT exercises Sidestepping length of pool 3 round trips with mini squat Forward walking  3 round trips backward walking 3 round trips March walking 3 round trips Tandem walk 3 round trips Leg swings hip abd/add X 20 bilat with UE support with UE support Leg swings hip flexion/extension X 20 bilat Gastroc stretch 30 sec X 2 off of first step Heel and toe raises 2X10 with UE support off of first  step Squats 2X10 with UE support off of first step Hip circles CW and CCW X 15 each bilat 1/4 squat with shoulder extension X15 with kickboard 1/4 lunge with push/pull kickboard 2X15 Lunge step to pool wall, touch wall, then step back, X 15 bilat Seated on pool chair and cycling 3 min   DATE:  03/13/2023 Therapeutic Exercise: Seated Lt LAQ 2 sec stretch in end ranges 2 x 15 2 lbs  Seated Lt ankle fitter board DF/PF x 15 Seated bilateral PF with slow lowering x 15 Supine elevated Lt ankle green band x 20 df, pf, inversion, eversion each  Neuro Re-ed Tandem stance 30 seconds x 3 bilateral with occasional HHA  Modalities: Vaso Lt knee/ankle combo, medium compression 34 deg , supine w/ LE elevation    PATIENT EDUCATION:  02/21/2023  Education details: HEP update Person educated: Patient Education method: Programmer, multimedia, Demonstration, Verbal cues, and Handouts Education comprehension: verbalized understanding, returned demonstration, and verbal cues required  HOME EXERCISE PROGRAM: Access Code: [email or web address] URL: [email or web address] Date: 02/21/2023 Prepared by: Chyrel Masson  Exercises - Supine Quadricep Sets  - 3-5 x daily - 7 x weekly - 1 sets - 10 reps - 5 hold - Supine Heel Slide (Mirrored)  - 3-5 x daily - 7 x weekly - 1-2 sets - 10 reps - 2 hold - Small Range Straight Leg Raise  - 1-2 x daily - 7 x weekly - 1-2 sets - 10-15 reps - Seated Long Arc Quad (Mirrored)  - 3-5 x daily - 7 x weekly - 1-2 sets - 10 reps - 2 hold - Seated Quad Set (Mirrored)  - 3-5 x daily - 7 x weekly - 1 sets - 10 reps - 5 hold - Seated Gastroc Stretch with Strap (Mirrored)  - 2-3 x daily - 7 x weekly - 1 sets - 3 reps - 30 hold - Long Sitting Eccentric Ankle Plantar Flexion with Resistance  - 1-2 x daily - 7 x weekly - 3 sets - 10  reps  ASSESSMENT:  CLINICAL IMPRESSION: Despite more overall pain in ankle and hip she was able to complete her aquatic PT exercises today with good  tolerance. Continued skilled PT services warranted at this time.    OBJECTIVE IMPAIRMENTS: Abnormal gait, decreased activity tolerance, decreased balance, decreased coordination, decreased endurance, decreased mobility, difficulty walking, decreased ROM, decreased strength, hypomobility, increased edema, impaired perceived functional ability, impaired flexibility, improper body mechanics, postural dysfunction, and pain.   ACTIVITY LIMITATIONS: carrying, lifting, bending, sitting, standing, squatting, sleeping, stairs, transfers, bed mobility, bathing, toileting, dressing, and locomotion level  PARTICIPATION LIMITATIONS: meal prep, cleaning, laundry, interpersonal relationship, driving, shopping, and community activity  PERSONAL FACTORS:  History of 05/2022 lumbar surgery, GERD, COPD, HTN, history of arthritis, multiple treatment areas current  are also affecting patient's functional outcome.   REHAB POTENTIAL: Fair to good  CLINICAL DECISION MAKING: Evolving/moderate complexity  EVALUATION COMPLEXITY: Moderate   GOALS: Goals reviewed with patient? Yes  SHORT TERM GOALS: (target date for Short term goals are 3 weeks 03/09/2023)   1.  Patient will demonstrate independent use of home exercise program to maintain progress from in clinic treatments.  Goal status: Met   LONG TERM GOALS: (target dates for all long term goals are 10 weeks  04/27/2023 )   1. Patient will demonstrate/report pain at worst less than or equal to 2/10 to facilitate minimal limitation in daily activity secondary to pain symptoms.  Goal status: on going 03/02/2023   2. Patient will demonstrate independent use of home exercise program to facilitate ability to maintain/progress functional gains from skilled physical therapy services.  Goal status: on going 03/02/2023   3. Patient will demonstrate FOTO outcome > or = 54 % to indicate reduced disability due to condition.  Goal status: on going 03/02/2023   4.   Patient will demonstrate Lt LE MMT 5/5 throughout to faciltiate usual transfers, stairs, squatting at [facility] for daily life.   Goal status: on going 03/02/2023   5.  Patient will demonstrate ability to ascend/descend stairs reciprocal gait pattern with single hand rail assist.  Goal status: on going 03/02/2023   6.  Patient will demonstrate Lt knee AROM 0-120 deg to facilitate ability to perform transfers, ambulation and other activity in daily life.  Goal status: on going 03/02/2023   7.  Patient will demonstrate ability to ambulation community distances > 500 ft.  Goal Status: on going 03/02/2023   PLAN:  PT FREQUENCY: 1-2x/week  PT DURATION: 10 weeks  PLANNED INTERVENTIONS: Therapeutic exercises, Therapeutic activity, Neuro Muscular re-education, Balance training, Gait training, Patient/Family education, Joint mobilization, Stair training, DME instructions, Dry Needling, Electrical stimulation, Traction, Cryotherapy, vasopneumatic deviceMoist heat, Taping, Ultrasound, Ionotophoresis 4mg /ml Dexamethasone, and aquatic therapy, Manual therapy.  All included unless contraindicated  PLAN FOR NEXT SESSION: WB strengthening as tolerated (possible Leg press use, static balance).  Aquatic PT PRN  Ivery Quale, PT, DPT 03/23/23 10:22 AM

## 2023-03-25 ENCOUNTER — Other Ambulatory Visit: Payer: Self-pay | Admitting: Internal Medicine

## 2023-03-26 ENCOUNTER — Other Ambulatory Visit: Payer: Self-pay

## 2023-03-26 ENCOUNTER — Encounter: Payer: Self-pay | Admitting: Rehabilitative and Restorative Service Providers"

## 2023-03-26 ENCOUNTER — Ambulatory Visit (INDEPENDENT_AMBULATORY_CARE_PROVIDER_SITE_OTHER): Payer: Medicare Other | Admitting: Rehabilitative and Restorative Service Providers"

## 2023-03-26 DIAGNOSIS — M6281 Muscle weakness (generalized): Secondary | ICD-10-CM | POA: Diagnosis not present

## 2023-03-26 DIAGNOSIS — M25562 Pain in left knee: Secondary | ICD-10-CM | POA: Diagnosis not present

## 2023-03-26 DIAGNOSIS — R262 Difficulty in walking, not elsewhere classified: Secondary | ICD-10-CM

## 2023-03-26 DIAGNOSIS — G8929 Other chronic pain: Secondary | ICD-10-CM

## 2023-03-26 DIAGNOSIS — M79662 Pain in left lower leg: Secondary | ICD-10-CM

## 2023-03-26 DIAGNOSIS — R600 Localized edema: Secondary | ICD-10-CM

## 2023-03-26 NOTE — Therapy (Signed)
OUTPATIENT PHYSICAL THERAPY TREATMENT /PROGRESS NOTE   Patient Name: Frances Nelson MRN: [id number] DOB:10-05-1955, 67 y.o., female Today's Date: 03/26/2023  Progress Note Reporting Period 02/16/2023 to 03/26/2023  See note below for Objective Data and Assessment of Progress/Goals.    END OF SESSION:  PT End of Session - 03/26/23 1102     Visit Number 10    Number of Visits 20    Date for PT Re-Evaluation 04/27/23    Authorization Type MCR    Progress Note Due on Visit 20    PT Start Time 1059    PT Stop Time 1149    PT Time Calculation (min) 50 min    Activity Tolerance Patient limited by pain    Behavior During Therapy WFL for tasks assessed/performed                   Past Medical History:  Diagnosis Date   Arthritis    Asthma    COPD (chronic obstructive pulmonary disease) (HCC)    GERD (gastroesophageal reflux disease)    Hypertension    Pneumonia    Past Surgical History:  Procedure Laterality Date   ABDOMINAL HYSTERECTOMY     APPENDECTOMY     HERNIA REPAIR     Umbilical x 3   TONSILLECTOMY     TRANSFORAMINAL LUMBAR INTERBODY FUSION (TLIF) WITH PEDICLE SCREW FIXATION 1 LEVEL N/A 05/18/2022   Procedure: Lumbar Four-Five Open Laminectomy/Transforaminal Lumbar Interbody Fusion/Posterolateral fusion;  Surgeon: Jadene Pierini, MD;  Location: [facility] OR;  Service: Neurosurgery;  Laterality: N/A;   Patient Active Problem List   Diagnosis Date Noted   Urethritis 10/05/2022   External otitis of left ear 10/05/2022   Spondylolisthesis of lumbar region 05/18/2022   Smoker 04/08/2022   Deviated nasal septum 04/08/2022   Bilateral pain of leg and foot 04/08/2022   Follicular acne 04/06/2022   Baker cyst, left 04/06/2022   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   Depression 10/04/2021   Left knee pain 10/04/2021   Chronic pain 04/09/2021   Pelvic pain 04/09/2021   HLD (hyperlipidemia) 04/09/2021   Vitamin D deficiency  04/09/2021   Estrogen deficiency 04/09/2021   Hyperglycemia 04/01/2021   Chronic cough 12/30/2020   GERD (gastroesophageal reflux disease) 11/06/2019   Bilateral leg edema 10/16/2019   Pleuritic chest pain 10/16/2019   Hypertension 10/02/2019   Arthralgia 06/28/2018   Sinusitis 06/28/2018   Renal cyst 05/03/2018   Syncope 05/03/2018   Stress and adjustment reaction 05/03/2018   History of tobacco use 05/02/2018   Primary osteoarthritis of both hands 04/10/2018   Primary osteoarthritis of both knees 04/10/2018   Primary osteoarthritis of both feet 04/10/2018   Achilles tendinitis 04/02/2018   Screening for breast cancer 03/15/2018   Positive ANA (antinuclear antibody) 03/15/2018   Screening for colon cancer 03/15/2018   Rash 03/15/2018   Achilles tendon pain 11/13/2017   Cervical myelopathy with cervical radiculopathy (HCC) 11/13/2017   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   Allergic rhinitis 06/19/2017   Edema 12/24/2015   Carpal tunnel syndrome, right 12/16/2015   Hypercalcemia 11/11/2015   Pain in both upper extremities 11/11/2015   Drug reaction 10/28/2015   Muscle spasms of both lower extremities 10/28/2015   Nerve pain 10/28/2015   Chronic asthmatic bronchitis with acute exacerbation (HCC) 09/06/2015   Environmental and seasonal allergies 07/17/2014    PCP: Corwin Levins MD  REFERRING PROVIDER: Kathryne Hitch, MD  REFERRING DIAG: [phone] (ICD-10-CM) - Status  post arthroscopic surgery of left knee M76.60 (ICD-10-CM) - Achilles tendon pain  THERAPY DIAG:  Chronic pain of left knee  Pain in left lower leg  Muscle weakness (generalized)  Localized edema  Difficulty in walking, not elsewhere classified  Rationale for Evaluation and Treatment: Rehabilitation  ONSET DATE: Surgery 12/21/2022 on Lt knee  SUBJECTIVE:   SUBJECTIVE STATEMENT: Pt indicated mostly walking with SPC in public. Some walking independent at home for short distances.  Pt indicated  feeling 5/10 or so in knee upon arrival, 7/10.  Rated somewhat better +3 on global rating of change.  Reported swelling continued to be troublesome.    PERTINENT HISTORY: History of 05/2022 lumbar surgery, GERD, COPD, HTN, history of arthritis  PAIN:  NPRS scale: Lt ankle:  7/10, Lt knee 5/10 Pain location: Lt knee, Lt achilles Pain description: constant, throbbing,  Aggravating factors: sitting, prolonged WB activity, walking, bending, squatting, stairs Relieving factors: nothing specific   PRECAUTIONS: None  WEIGHT BEARING RESTRICTIONS: No  FALLS:  Has patient fallen in last 6 months? 1 fall without medical attention.   LIVING ENVIRONMENT: Lives in: House/apartment Stairs: multistory with bedroom upstairs but has moved bed downstairs.   Rail on Lt side going up.   OCCUPATION: Retired   PLOF: Independent, walking for exercise (5-6 miles/day in the past until 2012).  Dog walking   PATIENT GOALS: Reduce pain, move better, walk better without cane.   OBJECTIVE: (objective measures completed at initial evaluation unless otherwise dated)   PATIENT SURVEYS:  03/26/2023:  FOTO update:  44  02/16/2023 FOTO intake:  38  predicted:  54  COGNITION: 02/16/2023 Overall cognitive status: WFL    SENSATION: 02/16/2023 No specific check today  EDEMA:  02/16/2023 Lt leg edema noted from knee down compared to Lt.   MUSCLE LENGTH: 02/16/2023 No specific testing  POSTURE:  02/16/2023 Reduced lumbar lordosis in standing, Weight shift off Lt leg to Rt.   PALPATION: 02/16/2023 Tenderness around swollen Lt ankle, Lt knee to light touch.   LOWER EXTREMITY ROM:   ROM Right 02/16/2023 Left 02/16/2023 Left 02/21/2023 Left 03/02/2023 Left 03/26/2023  Hip flexion       Hip extension       Hip abduction       Hip adduction       Hip internal rotation       Hip external rotation       Knee flexion 125 92 AROM in supine heel slide c pain PROM 95/100 grossly assesed in sitting.  90 AROM in supine  heel slide c pain 96 AROM in supine heel slide c pain  Knee extension 0 0 AROM in quad set supine  0 AROM in seated quad set   Ankle dorsiflexion       Ankle plantarflexion       Ankle inversion       Ankle eversion        (Blank rows = not tested)  LOWER EXTREMITY MMT:  MMT Right 02/16/2023 Left 02/16/2023 Left 03/13/2023 Right 03/26/2023 Left 03/26/2023  Hip flexion 5/5 4/5     Hip extension       Hip abduction       Hip adduction       Hip internal rotation       Hip external rotation       Knee flexion 5/5 4/5 4/5  4+/5  Knee extension 4/5 3+/5 4-/5 5/5 52.9, 52 lbs  4-/5  15.9, 16.5 lbs with pain  Ankle dorsiflexion  5/5 3+/5 c pain 4/5  4+/5  Ankle plantarflexion  2+/5 c pain (unable to perform standing PF) 2+/5 c pain (unable to perform standing PF)  2+/5  Ankle inversion 5/5 3+/5 c pain 4/5 c pain  4+/5  Ankle eversion 5/5 3+/5 c pain 3+/5 c pain  4/5   (Blank rows = not tested)  LOWER EXTREMITY SPECIAL TESTS:  02/16/2023 No specific testing today.   FUNCTIONAL TESTS:  03/26/2023:  18 inch chair transfer: 2 tries required  02/16/2023 18 inch chair transfer: multiple tries required c noted deviation to Rt leg Lt SLS:  < 3 seconds Rt SLS: < 3 seconds  GAIT: 02/16/2023 Mod independent ambulation c SPC in Rt UE, decreased stance on Lt leg, decreased toe off progression noted.                                                                                                                                                                         TODAY'S TREATMENT                                                                             DATE:  03/26/2023 Therapeutic Exercise: Nustep Lvl 10 mins UE/LE Seated quad set 5 sec hold x 10 Lt leg Seated Slr 2 x 10 Lt leg Long sitting green band inversion, eversion, PF, DF x 15 Lt ankle Seated Lt hamstring curl green band 2 x 15   Modalities: Vaso Lt knee/ankle combo, medium compression 34 deg , supine w/ LE elevation  TODAY'S  TREATMENT                                                                             DATE: 03/23/23 Pt seen for aquatic therapy today.  Treatment took place in water 3.5-4.75 ft in depth at the [facility] pool. Temp of water was 91.  Pt entered/exited the pool via stairs with hand rail.   Pt requires the buoyancy and hydrostatic pressure of water for support, and to offload joints by unweighting joint load by at least 50 % in navel deep water and by at least 75-80% in chest to neck deep  water.  Viscosity of the water is needed for resistance of strengthening. Water current perturbations provides challenge to standing balance requiring increased core activation. Aquatic PT exercises Sidestepping length of pool 3 round trips with mini squat Forward walking  3 round trips backward walking 3 round trips March walking 3 round trips Tandem walk 3 round trips Leg swings hip abd/add X 20 bilat with UE support with UE support Leg swings hip flexion/extension X 20 bilat Gastroc stretch 30 sec X 2 off of first step Heel and toe raises 2X10 with UE support off of first step Squats 2X10 with UE support off of first step Hip circles CW and CCW X 15 each bilat 1/4 squat with shoulder extension X15 with kickboard 1/4 lunge with push/pull kickboard 2X15 Lunge step to pool wall, touch wall, then step back, X 15 bilat Seated on pool chair and cycling 3 min   TODAY'S TREATMENT                                                                             DATE:  03/13/2023 Therapeutic Exercise: Seated Lt LAQ 2 sec stretch in end ranges 2 x 15 2 lbs  Seated Lt ankle fitter board DF/PF x 15 Seated bilateral PF with slow lowering x 15 Supine elevated Lt ankle green band x 20 df, pf, inversion, eversion each  Neuro Re-ed Tandem stance 30 seconds x 3 bilateral with occasional HHA  Modalities: Vaso Lt knee/ankle combo, medium compression 34 deg , supine w/ LE elevation    PATIENT EDUCATION:   03/26/2023  Education details: HEP update Person educated: Patient Education method: Programmer, multimedia, Demonstration, Verbal cues, and Handouts Education comprehension: verbalized understanding, returned demonstration, and verbal cues required  HOME EXERCISE PROGRAM: Access Code: [email or web address] URL: [email or web address] Date: 03/26/2023 Prepared by: Chyrel Masson  Exercises - Supine Heel Slide (Mirrored)  - 3-5 x daily - 7 x weekly - 1-2 sets - 10 reps - 2 hold - Seated Long Arc Quad (Mirrored)  - 3-5 x daily - 7 x weekly - 1-2 sets - 10 reps - 2 hold - Seated Gastroc Stretch with Strap (Mirrored)  - 2-3 x daily - 7 x weekly - 1 sets - 3 reps - 30 hold - Seated Quad Set (Mirrored)  - 3-5 x daily - 7 x weekly - 1 sets - 10 reps - 5 hold - Seated Straight Leg Heel Taps  - 1-2 x daily - 7 x weekly - 2-3 sets - 10-15 reps - 2-3 hold - Long Sitting Eccentric Ankle Plantar Flexion with Resistance  - 1-2 x daily - 7 x weekly - 3 sets - 10 reps - Long Sitting Ankle Eversion with Resistance  - 1 x daily - 7 x weekly - 1-2 sets - 10-15 reps - Long Sitting Ankle Inversion with Resistance  - 1 x daily - 7 x weekly - 1-2 sets - 10-15 reps - Seated Ankle Dorsiflexion with Resistance  - 1 x daily - 7 x weekly - 1-2 sets - 10-15 reps  ASSESSMENT:  CLINICAL IMPRESSION: The patient has attended 10 visits over the course of treatment cycle.  Patient has reported overall improvement with global rating of change at +3.  See objective data above for updated information regarding current presentation. Mild improvements have been noted in most areas to this point.  Pt has continued impairment with strength and mobility within Lt leg that negatively impacts her functional movement pattern.  Continued skilled PT services recommended at this time.    OBJECTIVE IMPAIRMENTS: Abnormal gait, decreased activity tolerance, decreased balance, decreased coordination, decreased endurance, decreased mobility,  difficulty walking, decreased ROM, decreased strength, hypomobility, increased edema, impaired perceived functional ability, impaired flexibility, improper body mechanics, postural dysfunction, and pain.   ACTIVITY LIMITATIONS: carrying, lifting, bending, sitting, standing, squatting, sleeping, stairs, transfers, bed mobility, bathing, toileting, dressing, and locomotion level  PARTICIPATION LIMITATIONS: meal prep, cleaning, laundry, interpersonal relationship, driving, shopping, and community activity  PERSONAL FACTORS:  History of 05/2022 lumbar surgery, GERD, COPD, HTN, history of arthritis, multiple treatment areas current  are also affecting patient's functional outcome.   REHAB POTENTIAL: Fair to good  CLINICAL DECISION MAKING: Evolving/moderate complexity  EVALUATION COMPLEXITY: Moderate   GOALS: Goals reviewed with patient? Yes  SHORT TERM GOALS: (target date for Short term goals are 3 weeks 03/09/2023)   1.  Patient will demonstrate independent use of home exercise program to maintain progress from in clinic treatments.  Goal status: Met   LONG TERM GOALS: (target dates for all long term goals are 10 weeks  04/27/2023 )   1. Patient will demonstrate/report pain at worst less than or equal to 2/10 to facilitate minimal limitation in daily activity secondary to pain symptoms.  Goal status: on going 03/26/2023   2. Patient will demonstrate independent use of home exercise program to facilitate ability to maintain/progress functional gains from skilled physical therapy services.  Goal status: on going 03/26/2023   3. Patient will demonstrate FOTO outcome > or = 54 % to indicate reduced disability due to condition.  Goal status: on going 03/26/2023   4.  Patient will demonstrate Lt LE MMT 5/5 throughout to faciltiate usual transfers, stairs, squatting at [facility] for daily life.   Goal status: on going 03/26/2023   5.  Patient will demonstrate ability to ascend/descend stairs  reciprocal gait pattern with single hand rail assist.  Goal status: on going 03/26/2023   6.  Patient will demonstrate Lt knee AROM 0-120 deg to facilitate ability to perform transfers, ambulation and other activity in daily life.  Goal status: on going 03/26/2023   7.  Patient will demonstrate ability to ambulation community distances > 500 ft.  Goal Status: on going 03/26/2023   PLAN:  PT FREQUENCY: 1-2x/week  PT DURATION: 10 weeks  PLANNED INTERVENTIONS: Therapeutic exercises, Therapeutic activity, Neuro Muscular re-education, Balance training, Gait training, Patient/Family education, Joint mobilization, Stair training, DME instructions, Dry Needling, Electrical stimulation, Traction, Cryotherapy, vasopneumatic deviceMoist heat, Taping, Ultrasound, Ionotophoresis 4mg /ml Dexamethasone, and aquatic therapy, Manual therapy.  All included unless contraindicated  PLAN FOR NEXT SESSION: WB strengthening for leg as tolerated.  Continue to try to improve symptoms with knee flexion mobility.   Chyrel Masson, PT, DPT, OCS, ATC 03/26/23  11:42 AM

## 2023-03-27 NOTE — Progress Notes (Signed)
Office Visit Note  Patient: Frances Nelson             Date of Birth: 11-Dec-1955           MRN: [id number]             PCP: Corwin Levins, MD Referring: Corwin Levins, MD Visit Date: 04/05/2023 Occupation: @GUAROCC @  Subjective:  Pain in multiple joints  History of Present Illness: Frances Nelson is a 67 y.o. female osteoarthritis, sicca symptoms and positive ANA.  She returns today after her last visit in October 2023.  She states in November she had L4-L5 fusion by Dr. Johnsie Cancel. She continues to have pain and discomfort in her lower back.  The pain radiates to the left side.  She also had left meniscal tear repair in June 2024 Dr. Magnus Ivan.  She continues to have some discomfort in her left knee.  She is scheduled to have MRI of her left knee.  She is going for physical therapy and aqua therapy.  She continues to have dry eyes.  She is planning to get bilateral cataract surgeries in the near future.  She denies increased shortness of breath.    Activities of Daily Living:  Patient reports morning stiffness for 2-3 hours.   Patient Reports nocturnal pain.  Difficulty dressing/grooming: Reports Difficulty climbing stairs: Reports Difficulty getting out of chair: Reports Difficulty using hands for taps, buttons, cutlery, and/or writing: Reports  Review of Systems  Constitutional:  Positive for fatigue.  HENT:  Negative for mouth sores and mouth dryness.   Eyes:  Positive for dryness.  Respiratory:  Positive for shortness of breath.   Cardiovascular:  Negative for chest pain and palpitations.  Gastrointestinal:  Negative for blood in stool, constipation and diarrhea.  Endocrine: Positive for increased urination.  Genitourinary:  Positive for involuntary urination.  Musculoskeletal:  Positive for joint pain, joint pain, joint swelling, myalgias, muscle weakness, morning stiffness, muscle tenderness and myalgias. Negative for gait problem.  Skin:  Negative for color change,  rash, hair loss and sensitivity to sunlight.  Allergic/Immunologic: Negative for susceptible to infections.  Neurological:  Positive for headaches. Negative for dizziness.  Hematological:  Negative for swollen glands.  Psychiatric/Behavioral:  Positive for sleep disturbance. Negative for depressed mood. The patient is not nervous/anxious.     PMFS History:  Patient Active Problem List   Diagnosis Date Noted   Urethritis 10/05/2022   External otitis of left ear 10/05/2022   Spondylolisthesis of lumbar region 05/18/2022   Smoker 04/08/2022   Deviated nasal septum 04/08/2022   Bilateral pain of leg and foot 04/08/2022   Follicular acne 04/06/2022   Baker cyst, left 04/06/2022   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   Depression 10/04/2021   Left knee pain 10/04/2021   Chronic pain 04/09/2021   Pelvic pain 04/09/2021   HLD (hyperlipidemia) 04/09/2021   Vitamin D deficiency 04/09/2021   Estrogen deficiency 04/09/2021   Hyperglycemia 04/01/2021   Chronic cough 12/30/2020   GERD (gastroesophageal reflux disease) 11/06/2019   Bilateral leg edema 10/16/2019   Pleuritic chest pain 10/16/2019   Hypertension 10/02/2019   Arthralgia 06/28/2018   Sinusitis 06/28/2018   Renal cyst 05/03/2018   Syncope 05/03/2018   Stress and adjustment reaction 05/03/2018   History of tobacco use 05/02/2018   Primary osteoarthritis of both hands 04/10/2018   Primary osteoarthritis of both knees 04/10/2018   Primary osteoarthritis of both feet 04/10/2018   Achilles tendinitis 04/02/2018  Screening for breast cancer 03/15/2018   Positive ANA (antinuclear antibody) 03/15/2018   Screening for colon cancer 03/15/2018   Rash 03/15/2018   Achilles tendon pain 11/13/2017   Cervical myelopathy with cervical radiculopathy (HCC) 11/13/2017   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   Allergic rhinitis 06/19/2017   Edema 12/24/2015   Carpal tunnel syndrome, right 12/16/2015    Hypercalcemia 11/11/2015   Pain in both upper extremities 11/11/2015   Drug reaction 10/28/2015   Muscle spasms of both lower extremities 10/28/2015   Nerve pain 10/28/2015   Chronic asthmatic bronchitis with acute exacerbation (HCC) 09/06/2015   Environmental and seasonal allergies 07/17/2014    Past Medical History:  Diagnosis Date   Arthritis    Asthma    COPD (chronic obstructive pulmonary disease) (HCC)    GERD (gastroesophageal reflux disease)    Hypertension    Pneumonia     Family History  Problem Relation Age of Onset   Angina Father    Parkinson's disease Father    Leukemia Sister    Kidney disease Sister    Kidney disease Brother    Brain cancer Brother        glioblastoma    Cancer Brother    Allergies Son    Asthma Son    Allergies Son    Past Surgical History:  Procedure Laterality Date   ABDOMINAL HYSTERECTOMY     APPENDECTOMY     HERNIA REPAIR     Umbilical x 3   KNEE ARTHROSCOPY W/ MENISCAL REPAIR Left 12/2022   TONSILLECTOMY     TRANSFORAMINAL LUMBAR INTERBODY FUSION (TLIF) WITH PEDICLE SCREW FIXATION 1 LEVEL N/A 05/18/2022   Procedure: Lumbar Four-Five Open Laminectomy/Transforaminal Lumbar Interbody Fusion/Posterolateral fusion;  Surgeon: Jadene Pierini, MD;  Location: [facility] OR;  Service: Neurosurgery;  Laterality: N/A;   Social History   Social History Narrative   Right Handed    Lives in a two story home - Lives with son       Are you currently employed ?    What is your current occupation?retired   Do you live at home alone?   Who lives with you?    What type of home do you live in: 1 story or 2 story?        Immunization History  Administered Date(s) Administered   PFIZER(Purple Top)SARS-COV-2 Vaccination 11/06/2019, 12/01/2019   Tdap 03/15/2018     Objective: Vital Signs: BP (!) 138/96 (BP Location: Left Arm, Patient Position: Sitting, Cuff Size: Normal)   Pulse (!) 102   Resp 17   Ht 5\' 4"  (1.626 m)   Wt 212 lb 6.4 oz (96.3  kg)   BMI 36.46 kg/m    Physical Exam Vitals and nursing note reviewed.  Constitutional:      Appearance: She is well-developed.  HENT:     Head: Normocephalic and atraumatic.  Eyes:     Conjunctiva/sclera: Conjunctivae normal.  Cardiovascular:     Rate and Rhythm: Normal rate and regular rhythm.     Heart sounds: Normal heart sounds.  Pulmonary:     Effort: Pulmonary effort is normal.     Breath sounds: Normal breath sounds.  Abdominal:     General: Bowel sounds are normal.     Palpations: Abdomen is soft.  Musculoskeletal:     Cervical back: Normal range of motion.     Left lower leg: Edema present.  Lymphadenopathy:     Cervical: No cervical adenopathy.  Skin:    General: Skin  is warm and dry.     Capillary Refill: Capillary refill takes less than 2 seconds.  Neurological:     Mental Status: She is alert and oriented to person, place, and time.  Psychiatric:        Behavior: Behavior normal.      Musculoskeletal Exam: Cervical spine was in good range of motion.  She had limited painful range of motion lumbar spine.  Shoulder joints, elbow joints, wrist joints, MCPs PIPs and DIPs with good range of motion.  She had mild tenderness over right third PIP joint without synovitis.  Hip joints were difficult to assess in the seated position.  Her left knee joint was warm with recent surgery.  Some suprapatellar fullness was noted.  She had edema over left ankle.  There was no tenderness over ankles or MTPs.  CDAI Exam: CDAI Score: -- Patient Global: --; Provider Global: -- Swollen: --; Tender: -- Joint Exam 04/05/2023   No joint exam has been documented for this visit   There is currently no information documented on the homunculus. Go to the Rheumatology activity and complete the homunculus joint exam.  Investigation: No additional findings.  Imaging: No results found.  Recent Labs: Lab Results  Component Value Date   WBC 7.3 10/05/2022   HGB 15.0 10/05/2022    PLT 350.0 10/05/2022   NA 138 10/05/2022   K 3.9 10/05/2022   CL 100 10/05/2022   CO2 32 10/05/2022   GLUCOSE 86 10/05/2022   BUN 20 10/05/2022   CREATININE 1.07 10/05/2022   BILITOT 0.5 10/05/2022   ALKPHOS 107 10/05/2022   AST 18 10/05/2022   ALT 20 10/05/2022   PROT 7.2 10/05/2022   ALBUMIN 4.0 10/05/2022   CALCIUM 9.8 10/05/2022   GFRAA >60 07/04/2018    Speciality Comments: No specialty comments available.  Procedures:  No procedures performed Allergies: Symbicort [budesonide-formoterol fumarate], Tramadol, Celebrex [celecoxib], Chocolate flavor, Ciprofloxacin, Egg-derived products, Flavoring agent, Lisinopril, Lyrica [pregabalin], Penicillins, Wellbutrin [bupropion], and Codeine   Assessment / Plan:     Visit Diagnoses: Positive ANA (antinuclear antibody) - March 22, 2022 urine protein negative, ANA 1: 40NH and cytoplasmic, RNP 1.7,(double-stranded DNA, Smith,  SSA, SSB negative), C3-C4 normal -she denies any history of oral ulcers, nasal ulcers, malar rash, photosensitivity, Raynaud's or lymphadenopathy.  She continues to have dry eye symptoms for which she has been using over-the-counter eyedrops.  She states dry mouth symptoms are not very prominent.  She is any history of palpitations or increased shortness of breath.  I will check autoimmune labs today.  Plan: Protein / creatinine ratio, urine, CBC with Differential/Platelet, COMPLETE METABOLIC PANEL WITH GFR, Anti-DNA antibody, double-stranded, C3 and C4, ANA, RNP Antibody  Primary osteoarthritis of both hands -she complains of pain and discomfort in her bilateral hands.  No synovitis was noted.  Clinical and radiographic findings are consistent with early degenerative changes.  Primary osteoarthritis of both knees -she continues to have pain and discomfort in her knee joints.  She underwent left knee joint arthroscopic surgery by Dr. Magnus Ivan in June 2024.  She states she has been experiencing increased pain recently.   She is scheduled to have MRI of her left knee.  X-rays from 2019 showed mild degenerative changes.  Primary osteoarthritis of both feet -no warmth following effusion was noted.  She had left lower extremity edema.  Clinical and radiographic findings are consistent with osteoarthritis.  DDD (degenerative disc disease), cervical-she had good range  DDD (degenerative disc disease), lumbar -  With history of a spinal stenosis.  Patient had epidural injection x2.  She had lumbar spine fusion in November 2023 by Dr. Johnsie Cancel.  She states she continues to have lower back pain with left-sided radiculopathy.  She has been ambulating with the help of a cane.  Other medical problems are listed as follows:  Lower extremity numbness  Left leg weakness  Bilateral leg edema  Primary hypertension  Pure hypercholesterolemia  History of gastroesophageal reflux (GERD)  Renal cyst  COPD with asthma  Vitamin D deficiency  Stress and adjustment reaction  History of tobacco use - 1 pack/day for 45 years  Orders: Orders Placed This Encounter  Procedures   Protein / creatinine ratio, urine   CBC with Differential/Platelet   COMPLETE METABOLIC PANEL WITH GFR   Anti-DNA antibody, double-stranded   C3 and C4   ANA   RNP Antibody   No orders of the defined types were placed in this encounter.    Follow-Up Instructions: Return in about 1 year (around 04/04/2024) for Sicca, +ANA, Osteoarthritis.   Pollyann Savoy, MD  Note - This record has been created using Animal nutritionist.  Chart creation errors have been sought, but may not always  have been located. Such creation errors do not reflect on  the standard of medical care.

## 2023-03-30 ENCOUNTER — Encounter: Payer: Self-pay | Admitting: Physical Therapy

## 2023-03-30 ENCOUNTER — Ambulatory Visit (INDEPENDENT_AMBULATORY_CARE_PROVIDER_SITE_OTHER): Payer: Medicare Other | Admitting: Physical Therapy

## 2023-03-30 DIAGNOSIS — M79662 Pain in left lower leg: Secondary | ICD-10-CM

## 2023-03-30 DIAGNOSIS — M6281 Muscle weakness (generalized): Secondary | ICD-10-CM

## 2023-03-30 DIAGNOSIS — R262 Difficulty in walking, not elsewhere classified: Secondary | ICD-10-CM

## 2023-03-30 DIAGNOSIS — M25562 Pain in left knee: Secondary | ICD-10-CM

## 2023-03-30 DIAGNOSIS — R600 Localized edema: Secondary | ICD-10-CM | POA: Diagnosis not present

## 2023-03-30 DIAGNOSIS — G8929 Other chronic pain: Secondary | ICD-10-CM

## 2023-03-30 NOTE — Therapy (Signed)
OUTPATIENT PHYSICAL THERAPY TREATMENT   Patient Name: Frances Nelson MRN: [id number] DOB:Dec 07, 1955, 67 y.o., female Today's Date: 03/30/2023    END OF SESSION:  PT End of Session - 03/30/23 1017     Visit Number 11    Number of Visits 20    Date for PT Re-Evaluation 04/27/23    Authorization Type MCR    Progress Note Due on Visit 20    PT Start Time 1015    PT Stop Time 1100    PT Time Calculation (min) 45 min    Activity Tolerance Patient tolerated treatment well    Behavior During Therapy WFL for tasks assessed/performed                   Past Medical History:  Diagnosis Date   Arthritis    Asthma    COPD (chronic obstructive pulmonary disease) (HCC)    GERD (gastroesophageal reflux disease)    Hypertension    Pneumonia    Past Surgical History:  Procedure Laterality Date   ABDOMINAL HYSTERECTOMY     APPENDECTOMY     HERNIA REPAIR     Umbilical x 3   TONSILLECTOMY     TRANSFORAMINAL LUMBAR INTERBODY FUSION (TLIF) WITH PEDICLE SCREW FIXATION 1 LEVEL N/A 05/18/2022   Procedure: Lumbar Four-Five Open Laminectomy/Transforaminal Lumbar Interbody Fusion/Posterolateral fusion;  Surgeon: Jadene Pierini, MD;  Location: [facility] OR;  Service: Neurosurgery;  Laterality: N/A;   Patient Active Problem List   Diagnosis Date Noted   Urethritis 10/05/2022   External otitis of left ear 10/05/2022   Spondylolisthesis of lumbar region 05/18/2022   Smoker 04/08/2022   Deviated nasal septum 04/08/2022   Bilateral pain of leg and foot 04/08/2022   Follicular acne 04/06/2022   Baker cyst, left 04/06/2022   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   Depression 10/04/2021   Left knee pain 10/04/2021   Chronic pain 04/09/2021   Pelvic pain 04/09/2021   HLD (hyperlipidemia) 04/09/2021   Vitamin D deficiency 04/09/2021   Estrogen deficiency 04/09/2021   Hyperglycemia 04/01/2021   Chronic cough 12/30/2020   GERD (gastroesophageal reflux  disease) 11/06/2019   Bilateral leg edema 10/16/2019   Pleuritic chest pain 10/16/2019   Hypertension 10/02/2019   Arthralgia 06/28/2018   Sinusitis 06/28/2018   Renal cyst 05/03/2018   Syncope 05/03/2018   Stress and adjustment reaction 05/03/2018   History of tobacco use 05/02/2018   Primary osteoarthritis of both hands 04/10/2018   Primary osteoarthritis of both knees 04/10/2018   Primary osteoarthritis of both feet 04/10/2018   Achilles tendinitis 04/02/2018   Screening for breast cancer 03/15/2018   Positive ANA (antinuclear antibody) 03/15/2018   Screening for colon cancer 03/15/2018   Rash 03/15/2018   Achilles tendon pain 11/13/2017   Cervical myelopathy with cervical radiculopathy (HCC) 11/13/2017   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   Allergic rhinitis 06/19/2017   Edema 12/24/2015   Carpal tunnel syndrome, right 12/16/2015   Hypercalcemia 11/11/2015   Pain in both upper extremities 11/11/2015   Drug reaction 10/28/2015   Muscle spasms of both lower extremities 10/28/2015   Nerve pain 10/28/2015   Chronic asthmatic bronchitis with acute exacerbation (HCC) 09/06/2015   Environmental and seasonal allergies 07/17/2014    PCP: Corwin Levins MD  REFERRING PROVIDER: Kathryne Hitch, MD  REFERRING DIAG: [phone] (ICD-10-CM) - Status post arthroscopic surgery of left knee M76.60 (ICD-10-CM) - Achilles tendon pain  THERAPY DIAG:  Chronic pain of left knee  Pain in left lower leg  Muscle weakness (generalized)  Localized edema  Difficulty in walking, not elsewhere classified  Rationale for Evaluation and Treatment: Rehabilitation  ONSET DATE: Surgery 12/21/2022 on Lt knee  SUBJECTIVE:   SUBJECTIVE STATEMENT: She relays 7/10 pain in her ankle, 5/10 pain in her hip   PERTINENT HISTORY: History of 05/2022 lumbar surgery, GERD, COPD, HTN, history of arthritis  PAIN:  NPRS scale: Lt ankle:  7/10, Lt knee and hip 5/10 Pain location: Lt knee, Lt  achilles Pain description: constant, throbbing,  Aggravating factors: sitting, prolonged WB activity, walking, bending, squatting, stairs Relieving factors: nothing specific   PRECAUTIONS: None  WEIGHT BEARING RESTRICTIONS: No  FALLS:  Has patient fallen in last 6 months? 1 fall without medical attention.   LIVING ENVIRONMENT: Lives in: House/apartment Stairs: multistory with bedroom upstairs but has moved bed downstairs.   Rail on Lt side going up.   OCCUPATION: Retired   PLOF: Independent, walking for exercise (5-6 miles/day in the past until 2012).  Dog walking   PATIENT GOALS: Reduce pain, move better, walk better without cane.   OBJECTIVE: (objective measures completed at initial evaluation unless otherwise dated)   PATIENT SURVEYS:  03/26/2023:  FOTO update:  44  02/16/2023 FOTO intake:  38  predicted:  54  COGNITION: 02/16/2023 Overall cognitive status: WFL    SENSATION: 02/16/2023 No specific check today  EDEMA:  02/16/2023 Lt leg edema noted from knee down compared to Lt.   MUSCLE LENGTH: 02/16/2023 No specific testing  POSTURE:  02/16/2023 Reduced lumbar lordosis in standing, Weight shift off Lt leg to Rt.   PALPATION: 02/16/2023 Tenderness around swollen Lt ankle, Lt knee to light touch.   LOWER EXTREMITY ROM:   ROM Right 02/16/2023 Left 02/16/2023 Left 02/21/2023 Left 03/02/2023 Left 03/26/2023  Hip flexion       Hip extension       Hip abduction       Hip adduction       Hip internal rotation       Hip external rotation       Knee flexion 125 92 AROM in supine heel slide c pain PROM 95/100 grossly assesed in sitting.  90 AROM in supine heel slide c pain 96 AROM in supine heel slide c pain  Knee extension 0 0 AROM in quad set supine  0 AROM in seated quad set   Ankle dorsiflexion       Ankle plantarflexion       Ankle inversion       Ankle eversion        (Blank rows = not tested)  LOWER EXTREMITY MMT:  MMT Right 02/16/2023 Left 02/16/2023  Left 03/13/2023 Right 03/26/2023 Left 03/26/2023  Hip flexion 5/5 4/5     Hip extension       Hip abduction       Hip adduction       Hip internal rotation       Hip external rotation       Knee flexion 5/5 4/5 4/5  4+/5  Knee extension 4/5 3+/5 4-/5 5/5 52.9, 52 lbs  4-/5  15.9, 16.5 lbs with pain  Ankle dorsiflexion 5/5 3+/5 c pain 4/5  4+/5  Ankle plantarflexion  2+/5 c pain (unable to perform standing PF) 2+/5 c pain (unable to perform standing PF)  2+/5  Ankle inversion 5/5 3+/5 c pain 4/5 c pain  4+/5  Ankle eversion 5/5 3+/5 c pain 3+/5 c pain  4/5   (  Blank rows = not tested)  LOWER EXTREMITY SPECIAL TESTS:  02/16/2023 No specific testing today.   FUNCTIONAL TESTS:  03/26/2023:  18 inch chair transfer: 2 tries required  02/16/2023 18 inch chair transfer: multiple tries required c noted deviation to Rt leg Lt SLS:  < 3 seconds Rt SLS: < 3 seconds  GAIT: 02/16/2023 Mod independent ambulation c SPC in Rt UE, decreased stance on Lt leg, decreased toe off progression noted.                                                                                                                                                                        TODAY'S TREATMENT                                                                             DATE: 03/30/23 Pt seen for aquatic therapy today.  Treatment took place in water 3.5-4.75 ft in depth at the [facility] pool. Temp of water was 91.  Pt entered/exited the pool via stairs with hand rail.   Pt requires the buoyancy and hydrostatic pressure of water for support, and to offload joints by unweighting joint load by at least 50 % in navel deep water and by at least 75-80% in chest to neck deep water.  Viscosity of the water is needed for resistance of strengthening. Water current perturbations provides challenge to standing balance requiring increased core activation. Aquatic PT exercises Sidestepping length of pool 3 round trips with mini  squat Forward walking  3 round trips backward walking 3 round trips March walking 3 round trips Tandem walk 3 round trips Leg swings hip abd/add X 15 bilat with UE support with UE support Leg swings hip flexion/extension X 15 bilat Gastroc stretch 30 sec X 3 off of first step Heel and toe raises 2X10 with UE support off of first step Squats 2X10 with UE support off of first step Hip circles CW and CCW X 15 each bilat 1/4 squat with shoulder extension X15 with kickboard 1/4 lunge with push/pull kickboard 2X15 Lunge step to pool wall, touch wall, then step back, X 15 bilat Monster walks 3 round trips Seated on pool chair and cycling 3 min    03/26/2023 Therapeutic Exercise: Nustep Lvl 10 mins UE/LE Seated quad set 5 sec hold x 10 Lt leg Seated Slr 2 x 10 Lt leg Long sitting green band inversion, eversion, PF, DF x 15 Lt ankle Seated  Lt hamstring curl green band 2 x 15   Modalities: Vaso Lt knee/ankle combo, medium compression 34 deg , supine w/ LE elevation      PATIENT EDUCATION:  03/26/2023  Education details: HEP update Person educated: Patient Education method: Programmer, multimedia, Demonstration, Verbal cues, and Handouts Education comprehension: verbalized understanding, returned demonstration, and verbal cues required  HOME EXERCISE PROGRAM: Access Code: [email or web address] URL: [email or web address] Date: 03/26/2023 Prepared by: Chyrel Masson  Exercises - Supine Heel Slide (Mirrored)  - 3-5 x daily - 7 x weekly - 1-2 sets - 10 reps - 2 hold - Seated Long Arc Quad (Mirrored)  - 3-5 x daily - 7 x weekly - 1-2 sets - 10 reps - 2 hold - Seated Gastroc Stretch with Strap (Mirrored)  - 2-3 x daily - 7 x weekly - 1 sets - 3 reps - 30 hold - Seated Quad Set (Mirrored)  - 3-5 x daily - 7 x weekly - 1 sets - 10 reps - 5 hold - Seated Straight Leg Heel Taps  - 1-2 x daily - 7 x weekly - 2-3 sets - 10-15 reps - 2-3 hold - Long Sitting Eccentric Ankle Plantar Flexion with  Resistance  - 1-2 x daily - 7 x weekly - 3 sets - 10 reps - Long Sitting Ankle Eversion with Resistance  - 1 x daily - 7 x weekly - 1-2 sets - 10-15 reps - Long Sitting Ankle Inversion with Resistance  - 1 x daily - 7 x weekly - 1-2 sets - 10-15 reps - Seated Ankle Dorsiflexion with Resistance  - 1 x daily - 7 x weekly - 1-2 sets - 10-15 reps  ASSESSMENT:  CLINICAL IMPRESSION: She had good tolerance to aquatic PT session focusing on strength, mobility, balance, and endurance. Continued skilled PT services recommended at this time.    OBJECTIVE IMPAIRMENTS: Abnormal gait, decreased activity tolerance, decreased balance, decreased coordination, decreased endurance, decreased mobility, difficulty walking, decreased ROM, decreased strength, hypomobility, increased edema, impaired perceived functional ability, impaired flexibility, improper body mechanics, postural dysfunction, and pain.   ACTIVITY LIMITATIONS: carrying, lifting, bending, sitting, standing, squatting, sleeping, stairs, transfers, bed mobility, bathing, toileting, dressing, and locomotion level  PARTICIPATION LIMITATIONS: meal prep, cleaning, laundry, interpersonal relationship, driving, shopping, and community activity  PERSONAL FACTORS:  History of 05/2022 lumbar surgery, GERD, COPD, HTN, history of arthritis, multiple treatment areas current  are also affecting patient's functional outcome.   REHAB POTENTIAL: Fair to good  CLINICAL DECISION MAKING: Evolving/moderate complexity  EVALUATION COMPLEXITY: Moderate   GOALS: Goals reviewed with patient? Yes  SHORT TERM GOALS: (target date for Short term goals are 3 weeks 03/09/2023)   1.  Patient will demonstrate independent use of home exercise program to maintain progress from in clinic treatments.  Goal status: Met   LONG TERM GOALS: (target dates for all long term goals are 10 weeks  04/27/2023 )   1. Patient will demonstrate/report pain at worst less than or equal to  2/10 to facilitate minimal limitation in daily activity secondary to pain symptoms.  Goal status: on going 03/26/2023   2. Patient will demonstrate independent use of home exercise program to facilitate ability to maintain/progress functional gains from skilled physical therapy services.  Goal status: on going 03/26/2023   3. Patient will demonstrate FOTO outcome > or = 54 % to indicate reduced disability due to condition.  Goal status: on going 03/26/2023   4.  Patient will demonstrate Lt LE MMT 5/5 throughout to faciltiate usual transfers,  stairs, squatting at PLOF for daily life.   Goal status: on going 03/26/2023   5.  Patient will demonstrate ability to ascend/descend stairs reciprocal gait pattern with single hand rail assist.  Goal status: on going 03/26/2023   6.  Patient will demonstrate Lt knee AROM 0-120 deg to facilitate ability to perform transfers, ambulation and other activity in daily life.  Goal status: on going 03/26/2023   7.  Patient will demonstrate ability to ambulation community distances > 500 ft.  Goal Status: on going 03/26/2023   PLAN:  PT FREQUENCY: 1-2x/week  PT DURATION: 10 weeks  PLANNED INTERVENTIONS: Therapeutic exercises, Therapeutic activity, Neuro Muscular re-education, Balance training, Gait training, Patient/Family education, Joint mobilization, Stair training, DME instructions, Dry Needling, Electrical stimulation, Traction, Cryotherapy, vasopneumatic deviceMoist heat, Taping, Ultrasound, Ionotophoresis 4mg /ml Dexamethasone, and aquatic therapy, Manual therapy.  All included unless contraindicated  PLAN FOR NEXT SESSION: WB strengthening for leg as tolerated.  Continue to try to improve symptoms with knee flexion mobility. Aquatic PT PRN  Ivery Quale, PT, DPT 03/30/23 10:18 AM

## 2023-04-02 ENCOUNTER — Ambulatory Visit (INDEPENDENT_AMBULATORY_CARE_PROVIDER_SITE_OTHER): Payer: Medicare Other | Admitting: Rehabilitative and Restorative Service Providers"

## 2023-04-02 ENCOUNTER — Encounter: Payer: Self-pay | Admitting: Rehabilitative and Restorative Service Providers"

## 2023-04-02 DIAGNOSIS — R262 Difficulty in walking, not elsewhere classified: Secondary | ICD-10-CM

## 2023-04-02 DIAGNOSIS — M6281 Muscle weakness (generalized): Secondary | ICD-10-CM

## 2023-04-02 DIAGNOSIS — R600 Localized edema: Secondary | ICD-10-CM

## 2023-04-02 DIAGNOSIS — M25562 Pain in left knee: Secondary | ICD-10-CM

## 2023-04-02 DIAGNOSIS — M79662 Pain in left lower leg: Secondary | ICD-10-CM | POA: Diagnosis not present

## 2023-04-02 DIAGNOSIS — G8929 Other chronic pain: Secondary | ICD-10-CM

## 2023-04-02 NOTE — Therapy (Signed)
OUTPATIENT PHYSICAL THERAPY TREATMENT   Patient Name: KENNAH STOERMER MRN: [id number] DOB:1956-02-18, 67 y.o., female Today's Date: 04/02/2023    END OF SESSION:  PT End of Session - 04/02/23 1105     Visit Number 12    Number of Visits 20    Date for PT Re-Evaluation 04/27/23    Authorization Type MCR    Progress Note Due on Visit 20    PT Start Time 1100    PT Stop Time 1149    PT Time Calculation (min) 49 min    Activity Tolerance Patient tolerated treatment well    Behavior During Therapy WFL for tasks assessed/performed                    Past Medical History:  Diagnosis Date   Arthritis    Asthma    COPD (chronic obstructive pulmonary disease) (HCC)    GERD (gastroesophageal reflux disease)    Hypertension    Pneumonia    Past Surgical History:  Procedure Laterality Date   ABDOMINAL HYSTERECTOMY     APPENDECTOMY     HERNIA REPAIR     Umbilical x 3   TONSILLECTOMY     TRANSFORAMINAL LUMBAR INTERBODY FUSION (TLIF) WITH PEDICLE SCREW FIXATION 1 LEVEL N/A 05/18/2022   Procedure: Lumbar Four-Five Open Laminectomy/Transforaminal Lumbar Interbody Fusion/Posterolateral fusion;  Surgeon: Jadene Pierini, MD;  Location: [facility] OR;  Service: Neurosurgery;  Laterality: N/A;   Patient Active Problem List   Diagnosis Date Noted   Urethritis 10/05/2022   External otitis of left ear 10/05/2022   Spondylolisthesis of lumbar region 05/18/2022   Smoker 04/08/2022   Deviated nasal septum 04/08/2022   Bilateral pain of leg and foot 04/08/2022   Follicular acne 04/06/2022   Baker cyst, left 04/06/2022   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   Depression 10/04/2021   Left knee pain 10/04/2021   Chronic pain 04/09/2021   Pelvic pain 04/09/2021   HLD (hyperlipidemia) 04/09/2021   Vitamin D deficiency 04/09/2021   Estrogen deficiency 04/09/2021   Hyperglycemia 04/01/2021   Chronic cough 12/30/2020   GERD (gastroesophageal reflux  disease) 11/06/2019   Bilateral leg edema 10/16/2019   Pleuritic chest pain 10/16/2019   Hypertension 10/02/2019   Arthralgia 06/28/2018   Sinusitis 06/28/2018   Renal cyst 05/03/2018   Syncope 05/03/2018   Stress and adjustment reaction 05/03/2018   History of tobacco use 05/02/2018   Primary osteoarthritis of both hands 04/10/2018   Primary osteoarthritis of both knees 04/10/2018   Primary osteoarthritis of both feet 04/10/2018   Achilles tendinitis 04/02/2018   Screening for breast cancer 03/15/2018   Positive ANA (antinuclear antibody) 03/15/2018   Screening for colon cancer 03/15/2018   Rash 03/15/2018   Achilles tendon pain 11/13/2017   Cervical myelopathy with cervical radiculopathy (HCC) 11/13/2017   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   Allergic rhinitis 06/19/2017   Edema 12/24/2015   Carpal tunnel syndrome, right 12/16/2015   Hypercalcemia 11/11/2015   Pain in both upper extremities 11/11/2015   Drug reaction 10/28/2015   Muscle spasms of both lower extremities 10/28/2015   Nerve pain 10/28/2015   Chronic asthmatic bronchitis with acute exacerbation (HCC) 09/06/2015   Environmental and seasonal allergies 07/17/2014    PCP: Corwin Levins MD  REFERRING PROVIDER: Kathryne Hitch, MD  REFERRING DIAG: [phone] (ICD-10-CM) - Status post arthroscopic surgery of left knee M76.60 (ICD-10-CM) - Achilles tendon pain  THERAPY DIAG:  Chronic pain of left  knee  Pain in left lower leg  Muscle weakness (generalized)  Localized edema  Difficulty in walking, not elsewhere classified  Rationale for Evaluation and Treatment: Rehabilitation  ONSET DATE: Surgery 12/21/2022 on Lt knee  SUBJECTIVE:   SUBJECTIVE STATEMENT: Pt indicated knee doing better today despite weather, 4/10.  Ankle similar overall at 7/10.  Reported ankle stays about the same/constant.    PERTINENT HISTORY: History of 05/2022 lumbar surgery, GERD, COPD, HTN, history of arthritis  PAIN:  NPRS  scale: Lt ankle:  7/10, Lt knee 4/10 Pain location: Lt knee, Lt achilles Pain description: constant, throbbing,  Aggravating factors: WB activity Relieving factors: nothing specific   PRECAUTIONS: None  WEIGHT BEARING RESTRICTIONS: No  FALLS:  Has patient fallen in last 6 months? 1 fall without medical attention.   LIVING ENVIRONMENT: Lives in: House/apartment Stairs: multistory with bedroom upstairs but has moved bed downstairs.   Rail on Lt side going up.   OCCUPATION: Retired   PLOF: Independent, walking for exercise (5-6 miles/day in the past until 2012).  Dog walking   PATIENT GOALS: Reduce pain, move better, walk better without cane.   OBJECTIVE: (objective measures completed at initial evaluation unless otherwise dated)   PATIENT SURVEYS:  03/26/2023:  FOTO update:  44  02/16/2023 FOTO intake:  38  predicted:  54  COGNITION: 02/16/2023 Overall cognitive status: WFL    SENSATION: 02/16/2023 No specific check today  EDEMA:  02/16/2023 Lt leg edema noted from knee down compared to Lt.   MUSCLE LENGTH: 02/16/2023 No specific testing  POSTURE:  02/16/2023 Reduced lumbar lordosis in standing, Weight shift off Lt leg to Rt.   PALPATION: 02/16/2023 Tenderness around swollen Lt ankle, Lt knee to light touch.   LOWER EXTREMITY ROM:   ROM Right 02/16/2023 Left 02/16/2023 Left 02/21/2023 Left 03/02/2023 Left 03/26/2023  Hip flexion       Hip extension       Hip abduction       Hip adduction       Hip internal rotation       Hip external rotation       Knee flexion 125 92 AROM in supine heel slide c pain PROM 95/100 grossly assesed in sitting.  90 AROM in supine heel slide c pain 96 AROM in supine heel slide c pain  Knee extension 0 0 AROM in quad set supine  0 AROM in seated quad set   Ankle dorsiflexion       Ankle plantarflexion       Ankle inversion       Ankle eversion        (Blank rows = not tested)  LOWER EXTREMITY MMT:  MMT Right 02/16/2023 Left 02/16/2023  Left 03/13/2023 Right 03/26/2023 Left 03/26/2023  Hip flexion 5/5 4/5     Hip extension       Hip abduction       Hip adduction       Hip internal rotation       Hip external rotation       Knee flexion 5/5 4/5 4/5  4+/5  Knee extension 4/5 3+/5 4-/5 5/5 52.9, 52 lbs  4-/5  15.9, 16.5 lbs with pain  Ankle dorsiflexion 5/5 3+/5 c pain 4/5  4+/5  Ankle plantarflexion  2+/5 c pain (unable to perform standing PF) 2+/5 c pain (unable to perform standing PF)  2+/5  Ankle inversion 5/5 3+/5 c pain 4/5 c pain  4+/5  Ankle eversion 5/5 3+/5 c pain 3+/5  c pain  4/5   (Blank rows = not tested)  LOWER EXTREMITY SPECIAL TESTS:  02/16/2023 No specific testing today.   FUNCTIONAL TESTS:  03/26/2023:  18 inch chair transfer: 2 tries required  02/16/2023 18 inch chair transfer: multiple tries required c noted deviation to Rt leg Lt SLS:  < 3 seconds Rt SLS: < 3 seconds  GAIT: 02/16/2023 Mod independent ambulation c SPC in Rt UE, decreased stance on Lt leg, decreased toe off progression noted.                                                                                                                                                                         TODAY'S TREATMENT                                                                             DATE: 04/02/2023 Therapeutic Exercise: Nustep Lvl 10.5 mins UE/LE Double leg 87 lbs x15, single leg  x 15 -performed bilaterally 31 lbs  Long sitting green band inversion, eversion, PF, DF x 20 Lt ankle Seated Lt hamstring curl green band 2 x 15   Neuro Re-ed Forward BIG inspired stepping for weight shift/balance improvements x 12 each side Reverse BIG inspired stepping for weight shift/balance improvements x 10 each side   Modalities: Vaso Lt knee/ankle combo, medium compression 34 deg , supine w/ LE elevation  TODAY'S TREATMENT                                                                             DATE: 03/30/23 Pt seen for aquatic  therapy today.  Treatment took place in water 3.5-4.75 ft in depth at the [facility] pool. Temp of water was 91.  Pt entered/exited the pool via stairs with hand rail.   Pt requires the buoyancy and hydrostatic pressure of water for support, and to offload joints by unweighting joint load by at least 50 % in navel deep water and by at least 75-80% in chest to neck deep water.  Viscosity of the water is needed for resistance of strengthening. Water current perturbations provides challenge to standing balance requiring increased core activation.  Aquatic PT exercises Sidestepping length of pool 3 round trips with mini squat Forward walking  3 round trips backward walking 3 round trips March walking 3 round trips Tandem walk 3 round trips Leg swings hip abd/add X 15 bilat with UE support with UE support Leg swings hip flexion/extension X 15 bilat Gastroc stretch 30 sec X 3 off of first step Heel and toe raises 2X10 with UE support off of first step Squats 2X10 with UE support off of first step Hip circles CW and CCW X 15 each bilat 1/4 squat with shoulder extension X15 with kickboard 1/4 lunge with push/pull kickboard 2X15 Lunge step to pool wall, touch wall, then step back, X 15 bilat Monster walks 3 round trips Seated on pool chair and cycling 3 min    TODAY'S TREATMENT                                                                             DATE: 03/26/2023 Therapeutic Exercise: Nustep Lvl 10 mins UE/LE Seated quad set 5 sec hold x 10 Lt leg Seated Slr 2 x 10 Lt leg Long sitting green band inversion, eversion, PF, DF x 15 Lt ankle Seated Lt hamstring curl green band 2 x 15   Modalities: Vaso Lt knee/ankle combo, medium compression 34 deg , supine w/ LE elevation      PATIENT EDUCATION:  03/26/2023  Education details: HEP update Person educated: Patient Education method: Programmer, multimedia, Demonstration, Verbal cues, and Handouts Education comprehension: verbalized  understanding, returned demonstration, and verbal cues required  HOME EXERCISE PROGRAM: Access Code: [email or web address] URL: [email or web address] Date: 03/26/2023 Prepared by: Chyrel Masson  Exercises - Supine Heel Slide (Mirrored)  - 3-5 x daily - 7 x weekly - 1-2 sets - 10 reps - 2 hold - Seated Long Arc Quad (Mirrored)  - 3-5 x daily - 7 x weekly - 1-2 sets - 10 reps - 2 hold - Seated Gastroc Stretch with Strap (Mirrored)  - 2-3 x daily - 7 x weekly - 1 sets - 3 reps - 30 hold - Seated Quad Set (Mirrored)  - 3-5 x daily - 7 x weekly - 1 sets - 10 reps - 5 hold - Seated Straight Leg Heel Taps  - 1-2 x daily - 7 x weekly - 2-3 sets - 10-15 reps - 2-3 hold - Long Sitting Eccentric Ankle Plantar Flexion with Resistance  - 1-2 x daily - 7 x weekly - 3 sets - 10 reps - Long Sitting Ankle Eversion with Resistance  - 1 x daily - 7 x weekly - 1-2 sets - 10-15 reps - Long Sitting Ankle Inversion with Resistance  - 1 x daily - 7 x weekly - 1-2 sets - 10-15 reps - Seated Ankle Dorsiflexion with Resistance  - 1 x daily - 7 x weekly - 1-2 sets - 10-15 reps  ASSESSMENT:  CLINICAL IMPRESSION: Progressed to include WB and balance intervention c fair to good overall control.  Limitations in pain still noted in WB activity.  Continued skilled PT services warranted to continue to progress activity as tolerated and improve strength/mobility.    OBJECTIVE IMPAIRMENTS: Abnormal gait, decreased activity tolerance, decreased balance, decreased coordination, decreased endurance, decreased mobility,  difficulty walking, decreased ROM, decreased strength, hypomobility, increased edema, impaired perceived functional ability, impaired flexibility, improper body mechanics, postural dysfunction, and pain.   ACTIVITY LIMITATIONS: carrying, lifting, bending, sitting, standing, squatting, sleeping, stairs, transfers, bed mobility, bathing, toileting, dressing, and locomotion level  PARTICIPATION LIMITATIONS: meal  prep, cleaning, laundry, interpersonal relationship, driving, shopping, and community activity  PERSONAL FACTORS:  History of 05/2022 lumbar surgery, GERD, COPD, HTN, history of arthritis, multiple treatment areas current  are also affecting patient's functional outcome.   REHAB POTENTIAL: Fair to good  CLINICAL DECISION MAKING: Evolving/moderate complexity  EVALUATION COMPLEXITY: Moderate   GOALS: Goals reviewed with patient? Yes  SHORT TERM GOALS: (target date for Short term goals are 3 weeks 03/09/2023)   1.  Patient will demonstrate independent use of home exercise program to maintain progress from in clinic treatments.  Goal status: Met   LONG TERM GOALS: (target dates for all long term goals are 10 weeks  04/27/2023 )   1. Patient will demonstrate/report pain at worst less than or equal to 2/10 to facilitate minimal limitation in daily activity secondary to pain symptoms.  Goal status: on going 03/26/2023   2. Patient will demonstrate independent use of home exercise program to facilitate ability to maintain/progress functional gains from skilled physical therapy services.  Goal status: on going 03/26/2023   3. Patient will demonstrate FOTO outcome > or = 54 % to indicate reduced disability due to condition.  Goal status: on going 03/26/2023   4.  Patient will demonstrate Lt LE MMT 5/5 throughout to faciltiate usual transfers, stairs, squatting at [facility] for daily life.   Goal status: on going 03/26/2023   5.  Patient will demonstrate ability to ascend/descend stairs reciprocal gait pattern with single hand rail assist.  Goal status: on going 03/26/2023   6.  Patient will demonstrate Lt knee AROM 0-120 deg to facilitate ability to perform transfers, ambulation and other activity in daily life.  Goal status: on going 03/26/2023   7.  Patient will demonstrate ability to ambulation community distances > 500 ft.  Goal Status: on going 03/26/2023   PLAN:  PT FREQUENCY:  1-2x/week  PT DURATION: 10 weeks  PLANNED INTERVENTIONS: Therapeutic exercises, Therapeutic activity, Neuro Muscular re-education, Balance training, Gait training, Patient/Family education, Joint mobilization, Stair training, DME instructions, Dry Needling, Electrical stimulation, Traction, Cryotherapy, vasopneumatic deviceMoist heat, Taping, Ultrasound, Ionotophoresis 4mg /ml Dexamethasone, and aquatic therapy, Manual therapy.  All included unless contraindicated  PLAN FOR NEXT SESSION: WB, balance improvements.   Chyrel Masson, PT, DPT, OCS, ATC 04/02/23  11:51 AM

## 2023-04-05 ENCOUNTER — Ambulatory Visit: Payer: Medicare Other | Attending: Rheumatology | Admitting: Rheumatology

## 2023-04-05 VITALS — BP 120/78 | HR 93 | Resp 17 | Ht 64.0 in | Wt 212.4 lb

## 2023-04-05 DIAGNOSIS — M19041 Primary osteoarthritis, right hand: Secondary | ICD-10-CM | POA: Insufficient documentation

## 2023-04-05 DIAGNOSIS — E7800 Pure hypercholesterolemia, unspecified: Secondary | ICD-10-CM | POA: Diagnosis present

## 2023-04-05 DIAGNOSIS — Z87891 Personal history of nicotine dependence: Secondary | ICD-10-CM | POA: Diagnosis present

## 2023-04-05 DIAGNOSIS — Z9181 History of falling: Secondary | ICD-10-CM

## 2023-04-05 DIAGNOSIS — M170 Bilateral primary osteoarthritis of knee: Secondary | ICD-10-CM | POA: Diagnosis present

## 2023-04-05 DIAGNOSIS — R29898 Other symptoms and signs involving the musculoskeletal system: Secondary | ICD-10-CM | POA: Insufficient documentation

## 2023-04-05 DIAGNOSIS — R600 Localized edema: Secondary | ICD-10-CM | POA: Insufficient documentation

## 2023-04-05 DIAGNOSIS — M5136 Other intervertebral disc degeneration, lumbar region: Secondary | ICD-10-CM | POA: Diagnosis present

## 2023-04-05 DIAGNOSIS — M19042 Primary osteoarthritis, left hand: Secondary | ICD-10-CM | POA: Insufficient documentation

## 2023-04-05 DIAGNOSIS — M5030 Other cervical disc degeneration, unspecified cervical region: Secondary | ICD-10-CM | POA: Insufficient documentation

## 2023-04-05 DIAGNOSIS — N281 Cyst of kidney, acquired: Secondary | ICD-10-CM | POA: Diagnosis present

## 2023-04-05 DIAGNOSIS — R768 Other specified abnormal immunological findings in serum: Secondary | ICD-10-CM | POA: Diagnosis present

## 2023-04-05 DIAGNOSIS — Z8719 Personal history of other diseases of the digestive system: Secondary | ICD-10-CM | POA: Insufficient documentation

## 2023-04-05 DIAGNOSIS — J4489 Other specified chronic obstructive pulmonary disease: Secondary | ICD-10-CM | POA: Insufficient documentation

## 2023-04-05 DIAGNOSIS — E559 Vitamin D deficiency, unspecified: Secondary | ICD-10-CM | POA: Diagnosis present

## 2023-04-05 DIAGNOSIS — M19072 Primary osteoarthritis, left ankle and foot: Secondary | ICD-10-CM | POA: Diagnosis present

## 2023-04-05 DIAGNOSIS — I10 Essential (primary) hypertension: Secondary | ICD-10-CM | POA: Diagnosis present

## 2023-04-05 DIAGNOSIS — M19071 Primary osteoarthritis, right ankle and foot: Secondary | ICD-10-CM | POA: Diagnosis present

## 2023-04-05 DIAGNOSIS — R200 Anesthesia of skin: Secondary | ICD-10-CM | POA: Diagnosis present

## 2023-04-05 DIAGNOSIS — F4329 Adjustment disorder with other symptoms: Secondary | ICD-10-CM | POA: Diagnosis present

## 2023-04-06 ENCOUNTER — Encounter: Payer: Self-pay | Admitting: Physical Therapy

## 2023-04-06 ENCOUNTER — Ambulatory Visit (INDEPENDENT_AMBULATORY_CARE_PROVIDER_SITE_OTHER): Payer: Medicare Other | Admitting: Physical Therapy

## 2023-04-06 DIAGNOSIS — M79662 Pain in left lower leg: Secondary | ICD-10-CM

## 2023-04-06 DIAGNOSIS — M25562 Pain in left knee: Secondary | ICD-10-CM | POA: Diagnosis not present

## 2023-04-06 DIAGNOSIS — R600 Localized edema: Secondary | ICD-10-CM | POA: Diagnosis not present

## 2023-04-06 DIAGNOSIS — R262 Difficulty in walking, not elsewhere classified: Secondary | ICD-10-CM

## 2023-04-06 DIAGNOSIS — M6281 Muscle weakness (generalized): Secondary | ICD-10-CM | POA: Diagnosis not present

## 2023-04-06 DIAGNOSIS — G8929 Other chronic pain: Secondary | ICD-10-CM

## 2023-04-06 NOTE — Therapy (Signed)
OUTPATIENT PHYSICAL THERAPY TREATMENT   Patient Name: Frances Nelson MRN: [id number] DOB:[birth year], 67 y.o., female Today's Date: 04/06/2023    END OF SESSION:  PT End of Session - 04/06/23 0850     Visit Number 13    Number of Visits 20    Date for PT Re-Evaluation 04/27/23    Authorization Type MCR    Progress Note Due on Visit 20    PT Start Time [phone]    PT Stop Time 0930    PT Time Calculation (min) 44 min    Activity Tolerance Patient tolerated treatment well    Behavior During Therapy WFL for tasks assessed/performed                    Past Medical History:  Diagnosis Date   Arthritis    Asthma    COPD (chronic obstructive pulmonary disease) (HCC)    GERD (gastroesophageal reflux disease)    Hypertension    Pneumonia    Past Surgical History:  Procedure Laterality Date   ABDOMINAL HYSTERECTOMY     APPENDECTOMY     HERNIA REPAIR     Umbilical x 3   KNEE ARTHROSCOPY W/ MENISCAL REPAIR Left 12/2022   TONSILLECTOMY     TRANSFORAMINAL LUMBAR INTERBODY FUSION (TLIF) WITH PEDICLE SCREW FIXATION 1 LEVEL N/A 05/18/2022   Procedure: Lumbar Four-Five Open Laminectomy/Transforaminal Lumbar Interbody Fusion/Posterolateral fusion;  Surgeon: Jadene Pierini, MD;  Location: [facility] OR;  Service: Neurosurgery;  Laterality: N/A;   Patient Active Problem List   Diagnosis Date Noted   Urethritis 10/05/2022   External otitis of left ear 10/05/2022   Spondylolisthesis of lumbar region 05/18/2022   Smoker 04/08/2022   Deviated nasal septum 04/08/2022   Bilateral pain of leg and foot 04/08/2022   Follicular acne 04/06/2022   Baker cyst, left 04/06/2022   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   Depression 10/04/2021   Left knee pain 10/04/2021   Chronic pain 04/09/2021   Pelvic pain 04/09/2021   HLD (hyperlipidemia) 04/09/2021   Vitamin D deficiency 04/09/2021   Estrogen deficiency 04/09/2021   Hyperglycemia 04/01/2021    Chronic cough 12/30/2020   GERD (gastroesophageal reflux disease) 11/06/2019   Bilateral leg edema 10/16/2019   Pleuritic chest pain 10/16/2019   Hypertension 10/02/2019   Arthralgia 06/28/2018   Sinusitis 06/28/2018   Renal cyst 05/03/2018   Syncope 05/03/2018   Stress and adjustment reaction 05/03/2018   History of tobacco use 05/02/2018   Primary osteoarthritis of both hands 04/10/2018   Primary osteoarthritis of both knees 04/10/2018   Primary osteoarthritis of both feet 04/10/2018   Achilles tendinitis 04/02/2018   Screening for breast cancer 03/15/2018   Positive ANA (antinuclear antibody) 03/15/2018   Screening for colon cancer 03/15/2018   Rash 03/15/2018   Achilles tendon pain 11/13/2017   Cervical myelopathy with cervical radiculopathy (HCC) 11/13/2017   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   Allergic rhinitis 06/19/2017   Edema 12/24/2015   Carpal tunnel syndrome, right 12/16/2015   Hypercalcemia 11/11/2015   Pain in both upper extremities 11/11/2015   Drug reaction 10/28/2015   Muscle spasms of both lower extremities 10/28/2015   Nerve pain 10/28/2015   Chronic asthmatic bronchitis with acute exacerbation (HCC) 09/06/2015   Environmental and seasonal allergies 07/17/2014    PCP: Corwin Levins MD  REFERRING PROVIDER: Kathryne Hitch, MD  REFERRING DIAG: [phone] (ICD-10-CM) - Status post arthroscopic surgery of left knee M76.60 (ICD-10-CM) - Achilles tendon  pain  THERAPY DIAG:  Chronic pain of left knee  Pain in left lower leg  Muscle weakness (generalized)  Localized edema  Difficulty in walking, not elsewhere classified  Rationale for Evaluation and Treatment: Rehabilitation  ONSET DATE: Surgery 12/21/2022 on Lt knee  SUBJECTIVE:   SUBJECTIVE STATEMENT: Pt relays hurting more today, 7/10 from her back down to her foot   PERTINENT HISTORY: History of 05/2022 lumbar surgery, GERD, COPD, HTN, history of arthritis  PAIN:  NPRS scale: Lt ankle:   7/10, Lt knee 7/10, back 7/10 Pain location: Lt knee, Lt achilles Pain description: constant, throbbing,  Aggravating factors: WB activity Relieving factors: nothing specific   PRECAUTIONS: None  WEIGHT BEARING RESTRICTIONS: No  FALLS:  Has patient fallen in last 6 months? 1 fall without medical attention.   LIVING ENVIRONMENT: Lives in: House/apartment Stairs: multistory with bedroom upstairs but has moved bed downstairs.   Rail on Lt side going up.   OCCUPATION: Retired   PLOF: Independent, walking for exercise (5-6 miles/day in the past until 2012).  Dog walking   PATIENT GOALS: Reduce pain, move better, walk better without cane.   OBJECTIVE: (objective measures completed at initial evaluation unless otherwise dated)   PATIENT SURVEYS:  03/26/2023:  FOTO update:  44  02/16/2023 FOTO intake:  38  predicted:  54  COGNITION: 02/16/2023 Overall cognitive status: WFL    SENSATION: 02/16/2023 No specific check today  EDEMA:  02/16/2023 Lt leg edema noted from knee down compared to Lt.   MUSCLE LENGTH: 02/16/2023 No specific testing  POSTURE:  02/16/2023 Reduced lumbar lordosis in standing, Weight shift off Lt leg to Rt.   PALPATION: 02/16/2023 Tenderness around swollen Lt ankle, Lt knee to light touch.   LOWER EXTREMITY ROM:   ROM Right 02/16/2023 Left 02/16/2023 Left 02/21/2023 Left 03/02/2023 Left 03/26/2023  Hip flexion       Hip extension       Hip abduction       Hip adduction       Hip internal rotation       Hip external rotation       Knee flexion 125 92 AROM in supine heel slide c pain PROM 95/100 grossly assesed in sitting.  90 AROM in supine heel slide c pain 96 AROM in supine heel slide c pain  Knee extension 0 0 AROM in quad set supine  0 AROM in seated quad set   Ankle dorsiflexion       Ankle plantarflexion       Ankle inversion       Ankle eversion        (Blank rows = not tested)  LOWER EXTREMITY MMT:  MMT Right 02/16/2023 Left 02/16/2023  Left 03/13/2023 Right 03/26/2023 Left 03/26/2023  Hip flexion 5/5 4/5     Hip extension       Hip abduction       Hip adduction       Hip internal rotation       Hip external rotation       Knee flexion 5/5 4/5 4/5  4+/5  Knee extension 4/5 3+/5 4-/5 5/5 52.9, 52 lbs  4-/5  15.9, 16.5 lbs with pain  Ankle dorsiflexion 5/5 3+/5 c pain 4/5  4+/5  Ankle plantarflexion  2+/5 c pain (unable to perform standing PF) 2+/5 c pain (unable to perform standing PF)  2+/5  Ankle inversion 5/5 3+/5 c pain 4/5 c pain  4+/5  Ankle eversion 5/5 3+/5 c pain  3+/5 c pain  4/5   (Blank rows = not tested)  LOWER EXTREMITY SPECIAL TESTS:  02/16/2023 No specific testing today.   FUNCTIONAL TESTS:  03/26/2023:  18 inch chair transfer: 2 tries required  02/16/2023 18 inch chair transfer: multiple tries required c noted deviation to Rt leg Lt SLS:  < 3 seconds Rt SLS: < 3 seconds  GAIT: 02/16/2023 Mod independent ambulation c SPC in Rt UE, decreased stance on Lt leg, decreased toe off progression noted.                                                                                                                                                                         TODAY'S TREATMENT                                                                             DATE:   TODAY'S TREATMENT                                                                             DATE: 04/06/23 Pt seen for aquatic therapy today.  Treatment took place in water 3.5-4.75 ft in depth at the [facility] pool. Temp of water was 91.  Pt entered/exited the pool via stairs with hand rail.   Pt requires the buoyancy and hydrostatic pressure of water for support, and to offload joints by unweighting joint load by at least 50 % in navel deep water and by at least 75-80% in chest to neck deep water.  Viscosity of the water is needed for resistance of strengthening. Water current perturbations provides challenge to standing balance  requiring increased core activation. Aquatic PT exercises Sidestepping length of pool 3 round trips with mini squat Forward walking  3 round trips backward walking 3 round trips March walking 3 round trips with water dumbells Tandem walk 3 round trips with water dumbells Leg swings hip abd/add X 15 bilat with UE support with UE support Leg swings hip flexion/extension X 15 bilat Gastroc stretch 30 sec X 3 off of first step Step ups onto first step of pool X10 bilat with UE support Heel and toe raises  2X10 with UE support off of first step Squats 2X10 with UE support off of first step Hip circles CW and CCW X 15 each bilat 1/4 squat with shoulder extension X20 with kickboard 1/4 lunge with push/pull kickboard 2X15 Lunge step to pool wall, touch wall, then step back, X 15 bilat Monster walks 3 round trips Seated on pool chair and cycling 3 min 04/02/2023 Therapeutic Exercise: Nustep Lvl 10.5 mins UE/LE Double leg 87 lbs x15, single leg  x 15 -performed bilaterally 31 lbs  Long sitting green band inversion, eversion, PF, DF x 20 Lt ankle Seated Lt hamstring curl green band 2 x 15   Neuro Re-ed Forward BIG inspired stepping for weight shift/balance improvements x 12 each side Reverse BIG inspired stepping for weight shift/balance improvements x 10 each side   Modalities: Vaso Lt knee/ankle combo, medium compression 34 deg , supine w/ LE elevation     PATIENT EDUCATION:  03/26/2023  Education details: HEP update Person educated: Patient Education method: Programmer, multimedia, Demonstration, Verbal cues, and Handouts Education comprehension: verbalized understanding, returned demonstration, and verbal cues required  HOME EXERCISE PROGRAM: Access Code: [email or web address] URL: [email or web address] Date: 03/26/2023 Prepared by: Chyrel Masson  Exercises - Supine Heel Slide (Mirrored)  - 3-5 x daily - 7 x weekly - 1-2 sets - 10 reps - 2 hold - Seated Long Arc Quad (Mirrored)  -  3-5 x daily - 7 x weekly - 1-2 sets - 10 reps - 2 hold - Seated Gastroc Stretch with Strap (Mirrored)  - 2-3 x daily - 7 x weekly - 1 sets - 3 reps - 30 hold - Seated Quad Set (Mirrored)  - 3-5 x daily - 7 x weekly - 1 sets - 10 reps - 5 hold - Seated Straight Leg Heel Taps  - 1-2 x daily - 7 x weekly - 2-3 sets - 10-15 reps - 2-3 hold - Long Sitting Eccentric Ankle Plantar Flexion with Resistance  - 1-2 x daily - 7 x weekly - 3 sets - 10 reps - Long Sitting Ankle Eversion with Resistance  - 1 x daily - 7 x weekly - 1-2 sets - 10-15 reps - Long Sitting Ankle Inversion with Resistance  - 1 x daily - 7 x weekly - 1-2 sets - 10-15 reps - Seated Ankle Dorsiflexion with Resistance  - 1 x daily - 7 x weekly - 1-2 sets - 10-15 reps  ASSESSMENT:  CLINICAL IMPRESSION: She arrives with more overall pain but this did improve after her aquatic PT session and she relays her knee feels less tight. PT recommending to continue with current PT plan of care   OBJECTIVE IMPAIRMENTS: Abnormal gait, decreased activity tolerance, decreased balance, decreased coordination, decreased endurance, decreased mobility, difficulty walking, decreased ROM, decreased strength, hypomobility, increased edema, impaired perceived functional ability, impaired flexibility, improper body mechanics, postural dysfunction, and pain.   ACTIVITY LIMITATIONS: carrying, lifting, bending, sitting, standing, squatting, sleeping, stairs, transfers, bed mobility, bathing, toileting, dressing, and locomotion level  PARTICIPATION LIMITATIONS: meal prep, cleaning, laundry, interpersonal relationship, driving, shopping, and community activity  PERSONAL FACTORS:  History of 05/2022 lumbar surgery, GERD, COPD, HTN, history of arthritis, multiple treatment areas current  are also affecting patient's functional outcome.   REHAB POTENTIAL: Fair to good  CLINICAL DECISION MAKING: Evolving/moderate complexity  EVALUATION COMPLEXITY:  Moderate   GOALS: Goals reviewed with patient? Yes  SHORT TERM GOALS: (target date for Short term goals are 3 weeks 03/09/2023)   1.  Patient will demonstrate independent  use of home exercise program to maintain progress from in clinic treatments.  Goal status: Met   LONG TERM GOALS: (target dates for all long term goals are 10 weeks  04/27/2023 )   1. Patient will demonstrate/report pain at worst less than or equal to 2/10 to facilitate minimal limitation in daily activity secondary to pain symptoms.  Goal status: on going 03/26/2023   2. Patient will demonstrate independent use of home exercise program to facilitate ability to maintain/progress functional gains from skilled physical therapy services.  Goal status: on going 03/26/2023   3. Patient will demonstrate FOTO outcome > or = 54 % to indicate reduced disability due to condition.  Goal status: on going 03/26/2023   4.  Patient will demonstrate Lt LE MMT 5/5 throughout to faciltiate usual transfers, stairs, squatting at [facility] for daily life.   Goal status: on going 03/26/2023   5.  Patient will demonstrate ability to ascend/descend stairs reciprocal gait pattern with single hand rail assist.  Goal status: on going 03/26/2023   6.  Patient will demonstrate Lt knee AROM 0-120 deg to facilitate ability to perform transfers, ambulation and other activity in daily life.  Goal status: on going 03/26/2023   7.  Patient will demonstrate ability to ambulation community distances > 500 ft.  Goal Status: on going 03/26/2023   PLAN:  PT FREQUENCY: 1-2x/week  PT DURATION: 10 weeks  PLANNED INTERVENTIONS: Therapeutic exercises, Therapeutic activity, Neuro Muscular re-education, Balance training, Gait training, Patient/Family education, Joint mobilization, Stair training, DME instructions, Dry Needling, Electrical stimulation, Traction, Cryotherapy, vasopneumatic deviceMoist heat, Taping, Ultrasound, Ionotophoresis 4mg /ml Dexamethasone, and  aquatic therapy, Manual therapy.  All included unless contraindicated  PLAN FOR NEXT SESSION: WB, balance improvements.   Ivery Quale, PT, DPT 04/06/23 8:51 AM

## 2023-04-07 LAB — CBC WITH DIFFERENTIAL/PLATELET
Absolute Monocytes: 543 cells/uL (ref 200–950)
Basophils Absolute: 57 cells/uL (ref 0–200)
Basophils Relative: 0.7 %
Eosinophils Absolute: 429 cells/uL (ref 15–500)
Eosinophils Relative: 5.3 %
HCT: 45.1 % — ABNORMAL HIGH (ref 35.0–45.0)
Hemoglobin: 14.7 g/dL (ref 11.7–15.5)
Lymphs Abs: 2244 cells/uL (ref 850–3900)
MCH: 30.6 pg (ref 27.0–33.0)
MCHC: 32.6 g/dL (ref 32.0–36.0)
MCV: 93.8 fL (ref 80.0–100.0)
MPV: 10 fL (ref 7.5–12.5)
Monocytes Relative: 6.7 %
Neutro Abs: 4828 cells/uL (ref 1500–7800)
Neutrophils Relative %: 59.6 %
Platelets: 334 10*3/uL (ref 140–400)
RBC: 4.81 10*6/uL (ref 3.80–5.10)
RDW: 12.3 % (ref 11.0–15.0)
Total Lymphocyte: 27.7 %
WBC: 8.1 10*3/uL (ref 3.8–10.8)

## 2023-04-07 LAB — PROTEIN / CREATININE RATIO, URINE
Creatinine, Urine: 99 mg/dL (ref 20–275)
Protein/Creat Ratio: 91 mg/g creat (ref 24–184)
Protein/Creatinine Ratio: 0.091 mg/mg creat (ref 0.024–0.184)
Total Protein, Urine: 9 mg/dL (ref 5–24)

## 2023-04-07 LAB — COMPLETE METABOLIC PANEL WITH GFR
AG Ratio: 1.5 (calc) (ref 1.0–2.5)
ALT: 21 U/L (ref 6–29)
AST: 20 U/L (ref 10–35)
Albumin: 4.1 g/dL (ref 3.6–5.1)
Alkaline phosphatase (APISO): 110 U/L (ref 37–153)
BUN: 16 mg/dL (ref 7–25)
CO2: 30 mmol/L (ref 20–32)
Calcium: 9.8 mg/dL (ref 8.6–10.4)
Chloride: 100 mmol/L (ref 98–110)
Creat: 0.69 mg/dL (ref 0.50–1.05)
Globulin: 2.8 g/dL (calc) (ref 1.9–3.7)
Glucose, Bld: 121 mg/dL — ABNORMAL HIGH (ref 65–99)
Potassium: 4.2 mmol/L (ref 3.5–5.3)
Sodium: 138 mmol/L (ref 135–146)
Total Bilirubin: 0.4 mg/dL (ref 0.2–1.2)
Total Protein: 6.9 g/dL (ref 6.1–8.1)
eGFR: 95 mL/min/{1.73_m2} (ref >=60)

## 2023-04-07 LAB — C3 AND C4
C3 Complement: 174 mg/dL (ref 83–193)
C4 Complement: 32 mg/dL (ref 15–57)

## 2023-04-07 LAB — ANTI-DNA ANTIBODY, DOUBLE-STRANDED: ds DNA Ab: <1 IU/mL

## 2023-04-07 LAB — ANA: Anti Nuclear Antibody (ANA): NEGATIVE

## 2023-04-07 LAB — RNP ANTIBODY: Ribonucleic Protein(ENA) Antibody, IgG: 1.5 AI — AB

## 2023-04-08 NOTE — Progress Notes (Signed)
CBC and CMP are normal.  Glucose is mildly elevated probably not a fasting sample.  RNP remains positive.  ANA negative, double-stranded DNA negative, complements normal.  Urine protein creatinine ratio is normal.  Labs do not indicate an active autoimmune disease.  I will discuss results at the follow-up visit.

## 2023-04-09 ENCOUNTER — Ambulatory Visit: Payer: Medicare Other | Admitting: Internal Medicine

## 2023-04-09 ENCOUNTER — Encounter: Payer: Self-pay | Admitting: Internal Medicine

## 2023-04-09 VITALS — BP 122/82 | HR 75 | Temp 98.3°F | Ht 64.0 in | Wt 214.0 lb

## 2023-04-09 DIAGNOSIS — R739 Hyperglycemia, unspecified: Secondary | ICD-10-CM

## 2023-04-09 DIAGNOSIS — E559 Vitamin D deficiency, unspecified: Secondary | ICD-10-CM | POA: Diagnosis not present

## 2023-04-09 DIAGNOSIS — R3129 Other microscopic hematuria: Secondary | ICD-10-CM | POA: Diagnosis not present

## 2023-04-09 DIAGNOSIS — M25511 Pain in right shoulder: Secondary | ICD-10-CM | POA: Insufficient documentation

## 2023-04-09 DIAGNOSIS — E7800 Pure hypercholesterolemia, unspecified: Secondary | ICD-10-CM

## 2023-04-09 DIAGNOSIS — M25512 Pain in left shoulder: Secondary | ICD-10-CM

## 2023-04-09 DIAGNOSIS — G8929 Other chronic pain: Secondary | ICD-10-CM

## 2023-04-09 DIAGNOSIS — I10 Essential (primary) hypertension: Secondary | ICD-10-CM | POA: Diagnosis not present

## 2023-04-09 DIAGNOSIS — Z23 Encounter for immunization: Secondary | ICD-10-CM | POA: Diagnosis not present

## 2023-04-09 LAB — LIPID PANEL
Cholesterol: 228 mg/dL — ABNORMAL HIGH (ref 0–200)
HDL: 49.4 mg/dL (ref >39.00)
LDL Cholesterol: 132 mg/dL — ABNORMAL HIGH (ref 0–99)
NonHDL: 178.2
Total CHOL/HDL Ratio: 5
Triglycerides: 231 mg/dL — ABNORMAL HIGH (ref 0.0–149.0)
VLDL: 46.2 mg/dL — ABNORMAL HIGH (ref 0.0–40.0)

## 2023-04-09 LAB — HEPATIC FUNCTION PANEL
ALT: 24 U/L (ref 0–35)
AST: 21 U/L (ref 0–37)
Albumin: 4.1 g/dL (ref 3.5–5.2)
Alkaline Phosphatase: 91 U/L (ref 39–117)
Bilirubin, Direct: 0.1 mg/dL (ref 0.0–0.3)
Total Bilirubin: 0.6 mg/dL (ref 0.2–1.2)
Total Protein: 7.3 g/dL (ref 6.0–8.3)

## 2023-04-09 LAB — URINALYSIS, ROUTINE W REFLEX MICROSCOPIC
Bilirubin Urine: NEGATIVE
Ketones, ur: NEGATIVE
Leukocytes,Ua: NEGATIVE
Nitrite: NEGATIVE
Specific Gravity, Urine: 1.015 (ref 1.000–1.030)
Total Protein, Urine: NEGATIVE
Urine Glucose: NEGATIVE
Urobilinogen, UA: 0.2 (ref 0.0–1.0)
pH: 6 (ref 5.0–8.0)

## 2023-04-09 LAB — BASIC METABOLIC PANEL
BUN: 13 mg/dL (ref 6–23)
CO2: 29 mEq/L (ref 19–32)
Calcium: 9.7 mg/dL (ref 8.4–10.5)
Chloride: 102 mEq/L (ref 96–112)
Creatinine, Ser: 0.66 mg/dL (ref 0.40–1.20)
GFR: 90.8 mL/min (ref >60.00)
Glucose, Bld: 103 mg/dL — ABNORMAL HIGH (ref 70–99)
Potassium: 4.5 mEq/L (ref 3.5–5.1)
Sodium: 138 mEq/L (ref 135–145)

## 2023-04-09 LAB — HEMOGLOBIN A1C: Hgb A1c MFr Bld: 6.1 % (ref 4.6–6.5)

## 2023-04-09 NOTE — Progress Notes (Unsigned)
Patient ID: Frances Nelson, female   DOB: 05/10/1956, 67 y.o.   MRN: [id number]        Chief Complaint: follow up microhematuria, bilateral shoulder pain, dyslipidemia, htn, hyperglycemia, low vit d       HPI:  Frances Nelson is a 67 y.o. female here overall doing ok, Pt denies chest pain, increased sob or doe, wheezing, orthopnea, PND, increased LE swelling, palpitations, dizziness or syncope.   Pt denies polydipsia, polyuria, or new focal neuro s/s.    Pt denies fever, wt loss, night sweats, loss of appetite, or other constitutional symptoms         Pt also with c/o recent worsening bilateral shoulder pain with reduced ROM, has been suggested to have bilateral rotater cuff issue by a friend, and she is wondering if needs to ortho for this.  Incidentally already is S/p left knee meniscus repair, some improved now, Pt continues to have recurring LBP without change in severity, bowel or bladder change, fever, wt loss,  worsening LE pain/numbness/weakness, gait change or falls.  Doing PT and aquatherapy. Taking duloxetine 30 mg.  Due for MRI left knee soon for unusual swelling.   Due for prevnar 20 today.  Never saw urology after last visit as referred - pt states never got called,  Denies urinary symptoms such as dysuria, frequency, urgency, flank pain, hematuria or n/v, fever, chills. Wondering if she still needs to do this.  Has also seen Dermatology with a lesion removed near left elbow, non melanoma.   BP Readings from Last 3 Encounters:  04/09/23 122/82  04/05/23 120/78  11/29/22 120/77         Past Medical History:  Diagnosis Date   Arthritis    Asthma    COPD (chronic obstructive pulmonary disease) (HCC)    GERD (gastroesophageal reflux disease)    Hypertension    Pneumonia    Past Surgical History:  Procedure Laterality Date   ABDOMINAL HYSTERECTOMY     APPENDECTOMY     HERNIA REPAIR     Umbilical x 3   KNEE ARTHROSCOPY W/ MENISCAL REPAIR Left 12/2022   TONSILLECTOMY      TRANSFORAMINAL LUMBAR INTERBODY FUSION (TLIF) WITH PEDICLE SCREW FIXATION 1 LEVEL N/A 05/18/2022   Procedure: Lumbar Four-Five Open Laminectomy/Transforaminal Lumbar Interbody Fusion/Posterolateral fusion;  Surgeon: Jadene Pierini, MD;  Location: [facility] OR;  Service: Neurosurgery;  Laterality: N/A;    reports that she has been smoking cigarettes. She has a 49 pack-year smoking history. She has never been exposed to tobacco smoke. She has never used smokeless tobacco. She reports that she does not drink alcohol and does not use drugs. family history includes Allergies in her son and son; Angina in her father; Asthma in her son; Brain cancer in her brother; Cancer in her brother; Kidney disease in her brother and sister; Leukemia in her sister; Parkinson's disease in her father. Allergies  Allergen Reactions   Symbicort [Budesonide-Formoterol Fumarate] Other (See Comments)    Patient reported dizziness, nausea, headaches and sore throat while using Symbicort 160. Reported on 09/03/17   Tramadol Hives   Celebrex [Celecoxib]     Bleeding.     Chocolate Flavor    Ciprofloxacin Nausea And Vomiting    Headache, shaking.    Egg-Derived Products    Flavoring Agent     Unknown   Lisinopril Cough    Pt off med for a week, some improvement   Lyrica [Pregabalin]     "Made me out  of it."    Penicillins     Patient preference   Wellbutrin [Bupropion]    Codeine Palpitations   Current Outpatient Medications on File Prior to Visit  Medication Sig Dispense Refill   acetaminophen (TYLENOL) 650 MG CR tablet Take 1,300 mg by mouth as needed.     acidophilus (RISAQUAD) CAPS capsule Take 1 capsule by mouth in the morning.     Adapalene (DIFFERIN) 0.3 % gel Apply 1 Application topically at bedtime. Apply a pea sized amount to face and behind ears 3 nights weekly 45 g 2   B Complex Vitamins (VITAMIN-B COMPLEX PO) Take by mouth daily.     Calcium-Vitamins C & D (CALCIUM/C/D PO) Take 1 tablet by mouth in the  morning.     cetirizine (ZYRTEC) 10 MG tablet Take 10 mg by mouth in the morning.     Cholecalciferol (HM VITAMIN D3 PO) Take 5,000 Units by mouth in the morning.     clindamycin (CLEOCIN T) 1 % SWAB Apply 1 Application topically in the morning. Wipe on face and behind ears every morning 60 each 2   clobetasol cream (TEMOVATE) 0.05 % Apply 1 Application topically 2 (two) times daily. Apply to affected areas 2 x daily for 2 weeks then stop. Use as needed (Patient taking differently: Apply 1 Application topically as needed. Apply to affected areas 2 x daily for 2 weeks then stop. Use as needed) 45 g 0   COLLAGEN PO Take by mouth. Collagen Peptides     DULoxetine (CYMBALTA) 60 MG capsule Take 60 mg by mouth daily.     hydrochlorothiazide (HYDRODIURIL) 12.5 MG tablet TAKE 1 TABLET BY MOUTH EVERY DAY (Patient taking differently: Take 12.5 mg by mouth as needed.) 90 tablet 2   losartan (COZAAR) 25 MG tablet TAKE 1 TABLET (25 MG TOTAL) BY MOUTH DAILY. 90 tablet 2   MELATONIN PO Take 5 mg by mouth at bedtime.     meloxicam (MOBIC) 15 MG tablet Take 0.5 tablets (7.5 mg total) by mouth daily. 60 tablet 0   methocarbamol (ROBAXIN) 750 MG tablet TAKE 1 TABLET (750 MG TOTAL) BY MOUTH EVERY 6 (SIX) HOURS AS NEEDED FOR MUSCLE SPASMS (NOT COVERED) 120 tablet 2   mometasone (NASONEX) 50 MCG/ACT nasal spray Place 2 sprays into the nose every evening.     montelukast (SINGULAIR) 10 MG tablet TAKE 1 TABLET BY MOUTH EVERYDAY AT BEDTIME 90 tablet 3   Multiple Vitamins-Minerals (MULTIVITAMIN WITH MINERALS) tablet Take 1 tablet by mouth in the morning.     omeprazole (PRILOSEC) 40 MG capsule TAKE 1 CAPSULE (40 MG TOTAL) BY MOUTH IN THE MORNING AND AT BEDTIME. 180 capsule 3   topiramate (TOPAMAX) 50 MG tablet Take 1 tablet (50 mg total) by mouth 2 (two) times daily. (Patient taking differently: Take 50 mg by mouth as needed.) 120 tablet 5   traMADol (ULTRAM) 50 MG tablet Take 1-2 tablets (50-100 mg total) by mouth every 6  (six) hours as needed. 30 tablet 0   Current Facility-Administered Medications on File Prior to Visit  Medication Dose Route Frequency Provider Last Rate Last Admin   triamcinolone acetonide (KENALOG) 10 MG/ML injection 10 mg  10 mg Intradermal Once Terri Piedra, DO            ROS:  All others reviewed and negative.  Objective        PE:  BP 122/82 (BP Location: Right Arm, Patient Position: Sitting, Cuff Size: Normal)   Pulse 75  Temp 98.3 F (36.8 C) (Oral)   Ht 5\' 4"  (1.626 m)   Wt 214 lb (97.1 kg)   SpO2 96%   BMI 36.73 kg/m                 Constitutional: Pt appears in NAD               HENT: Head: NCAT.                Right Ear: External ear normal.                 Left Ear: External ear normal.                Eyes: . Pupils are equal, round, and reactive to light. Conjunctivae and EOM are normal               Nose: without d/c or deformity               Neck: Neck supple. Gross normal ROM               Cardiovascular: Normal rate and regular rhythm.                 Pulmonary/Chest: Effort normal and breath sounds without rales or wheezing.                Abd:  Soft, NT, ND, + BS, no organomegaly               Neurological: Pt is alert. At baseline orientation, motor grossly intact               Skin: Skin is warm. No rashes, no other new lesions, LE edema - none; ;bilateral shoulders nontender with reduced ROM bilat to 90 degrees abduction only               Psychiatric: Pt behavior is normal without agitation   Micro: none  Cardiac tracings I have personally interpreted today:  none  Pertinent Radiological findings (summarize): none   Lab Results  Component Value Date   WBC 8.1 04/05/2023   HGB 14.7 04/05/2023   HCT 45.1 (H) 04/05/2023   PLT 334 04/05/2023   GLUCOSE 103 (H) 04/09/2023   CHOL 228 (H) 04/09/2023   TRIG 231.0 (H) 04/09/2023   HDL 49.40 04/09/2023   LDLDIRECT 143.0 10/05/2022   LDLCALC 132 (H) 04/09/2023   ALT 24 04/09/2023   AST 21  04/09/2023   NA 138 04/09/2023   K 4.5 04/09/2023   CL 102 04/09/2023   CREATININE 0.66 04/09/2023   BUN 13 04/09/2023   CO2 29 04/09/2023   TSH 1.83 10/05/2022   HGBA1C 6.1 04/09/2023   MICROALBUR 0.8 10/05/2022   Assessment/Plan:  Frances Nelson is a 67 y.o. White or Caucasian [1] female with  has a past medical history of Arthritis, Asthma, COPD (chronic obstructive pulmonary disease) (HCC), GERD (gastroesophageal reflux disease), Hypertension, and Pneumonia.  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 with pain - pt states will f/u herself with ortho as she is already being seen for left knee with MRI now being ordered as well  HLD (hyperlipidemia) Lab Results  Component Value Date   LDLCALC 132 (H) 04/09/2023   uncontrolled, pt wary of statins due to possible muscle pain, will ask her to take zetia 10 mg qd   Hyperglycemia Lab Results  Component Value Date   HGBA1C 6.1 04/09/2023  Stable, pt to continue current medical treatment  - diet, wt control   Hypertension BP Readings from Last 3 Encounters:  04/09/23 122/82  04/05/23 120/78  11/29/22 120/77   Stable, pt to continue medical treatment hct 12.5 every day, or losartan 25 mg qd   Microhematuria Repeat UA with persistent new microhematuria - will need to encourage pt to still urology by trying urology referral again  Vitamin D deficiency Last vitamin D Lab Results  Component Value Date   VD25OH 31.79 10/05/2022   Low, to start oral replacement  Followup: Return in about 6 months (around 10/07/2023).  Oliver Barre, MD 04/11/2023 7:57 AM [facility]

## 2023-04-09 NOTE — Patient Instructions (Signed)
You had the prevnar 20 pneumonia shot today  Please continue all other medications as before, and refills have been done if requested.  Please have the pharmacy call with any other refills you may need.  Please continue your efforts at being more active, low cholesterol diet, and weight control.  Please keep your appointments with your specialists as you may have planned  Please go to the LAB at the blood drawing area for the tests to be done  You will be contacted by phone if any changes need to be made immediately.  Otherwise, you will receive a letter about your results with an explanation, but please check with MyChart first.  Please make an Appointment to return in 6 months, or sooner if needed

## 2023-04-10 ENCOUNTER — Other Ambulatory Visit: Payer: Self-pay | Admitting: Internal Medicine

## 2023-04-10 ENCOUNTER — Ambulatory Visit (INDEPENDENT_AMBULATORY_CARE_PROVIDER_SITE_OTHER): Payer: Medicare Other | Admitting: Rehabilitative and Restorative Service Providers"

## 2023-04-10 ENCOUNTER — Encounter: Payer: Self-pay | Admitting: Rehabilitative and Restorative Service Providers"

## 2023-04-10 DIAGNOSIS — M79662 Pain in left lower leg: Secondary | ICD-10-CM | POA: Diagnosis not present

## 2023-04-10 DIAGNOSIS — R262 Difficulty in walking, not elsewhere classified: Secondary | ICD-10-CM

## 2023-04-10 DIAGNOSIS — M25562 Pain in left knee: Secondary | ICD-10-CM

## 2023-04-10 DIAGNOSIS — M6281 Muscle weakness (generalized): Secondary | ICD-10-CM

## 2023-04-10 DIAGNOSIS — R600 Localized edema: Secondary | ICD-10-CM | POA: Diagnosis not present

## 2023-04-10 DIAGNOSIS — G8929 Other chronic pain: Secondary | ICD-10-CM

## 2023-04-10 MED ORDER — EZETIMIBE 10 MG PO TABS: 10.0000 mg | ORAL_TABLET | Freq: Every day | ORAL | 3 refills | Status: DC

## 2023-04-10 NOTE — Therapy (Addendum)
OUTPATIENT PHYSICAL THERAPY TREATMENT   Patient Name: Frances Nelson MRN: [id number] DOB:[birth year], 67 y.o., female Today's Date: 04/10/2023    END OF SESSION:  PT End of Session - 04/10/23 1100     Visit Number 14    Number of Visits 20    Date for PT Re-Evaluation 04/27/23    Authorization Type MCR    Progress Note Due on Visit 20    PT Start Time 1100    PT Stop Time 1150    PT Time Calculation (min) 50 min    Activity Tolerance Patient limited by pain    Behavior During Therapy WFL for tasks assessed/performed                     Past Medical History:  Diagnosis Date   Arthritis    Asthma    COPD (chronic obstructive pulmonary disease) (HCC)    GERD (gastroesophageal reflux disease)    Hypertension    Pneumonia    Past Surgical History:  Procedure Laterality Date   ABDOMINAL HYSTERECTOMY     APPENDECTOMY     HERNIA REPAIR     Umbilical x 3   KNEE ARTHROSCOPY W/ MENISCAL REPAIR Left 12/2022   TONSILLECTOMY     TRANSFORAMINAL LUMBAR INTERBODY FUSION (TLIF) WITH PEDICLE SCREW FIXATION 1 LEVEL N/A 05/18/2022   Procedure: Lumbar Four-Five Open Laminectomy/Transforaminal Lumbar Interbody Fusion/Posterolateral fusion;  Surgeon: Jadene Pierini, MD;  Location: [facility] OR;  Service: Neurosurgery;  Laterality: N/A;   Patient Active Problem List   Diagnosis Date Noted   Microhematuria 04/09/2023   Bilateral shoulder pain 04/09/2023   Urethritis 10/05/2022   External otitis of left ear 10/05/2022   Spondylolisthesis of lumbar region 05/18/2022   Smoker 04/08/2022   Deviated nasal septum 04/08/2022   Bilateral pain of leg and foot 04/08/2022   Follicular acne 04/06/2022   Baker cyst, left 04/06/2022   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   Depression 10/04/2021   Left knee pain 10/04/2021   Chronic pain 04/09/2021   Pelvic pain 04/09/2021   HLD (hyperlipidemia) 04/09/2021   Vitamin D deficiency 04/09/2021    Estrogen deficiency 04/09/2021   Hyperglycemia 04/01/2021   Chronic cough 12/30/2020   GERD (gastroesophageal reflux disease) 11/06/2019   Bilateral leg edema 10/16/2019   Pleuritic chest pain 10/16/2019   Hypertension 10/02/2019   Arthralgia 06/28/2018   Sinusitis 06/28/2018   Renal cyst 05/03/2018   Syncope 05/03/2018   Stress and adjustment reaction 05/03/2018   History of tobacco use 05/02/2018   Primary osteoarthritis of both hands 04/10/2018   Primary osteoarthritis of both knees 04/10/2018   Primary osteoarthritis of both feet 04/10/2018   Achilles tendinitis 04/02/2018   Screening for breast cancer 03/15/2018   Positive ANA (antinuclear antibody) 03/15/2018   Screening for colon cancer 03/15/2018   Rash 03/15/2018   Achilles tendon pain 11/13/2017   Cervical myelopathy with cervical radiculopathy (HCC) 11/13/2017   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   Allergic rhinitis 06/19/2017   Edema 12/24/2015   Carpal tunnel syndrome, right 12/16/2015   Hypercalcemia 11/11/2015   Pain in both upper extremities 11/11/2015   Drug reaction 10/28/2015   Muscle spasms of both lower extremities 10/28/2015   Nerve pain 10/28/2015   Chronic asthmatic bronchitis with acute exacerbation (HCC) 09/06/2015   Environmental and seasonal allergies 07/17/2014    PCP: Corwin Levins MD  REFERRING PROVIDER: Kathryne Hitch, MD  REFERRING DIAG: [id number] (ICD-10-CM) - Status  post arthroscopic surgery of left knee M76.60 (ICD-10-CM) - Achilles tendon pain  THERAPY DIAG:  Chronic pain of left knee  Pain in left lower leg  Muscle weakness (generalized)  Localized edema  Difficulty in walking, not elsewhere classified  Rationale for Evaluation and Treatment: Rehabilitation  ONSET DATE: Surgery 12/21/2022 on Lt knee  SUBJECTIVE:   SUBJECTIVE STATEMENT: Pt indicated having several medical visits with blood drawing and shot that had created general ache/soreness through body.      PERTINENT HISTORY: History of 05/2022 lumbar surgery, GERD, COPD, HTN, history of arthritis  PAIN:  NPRS scale: Lt ankle:  7/10, Lt knee 6/10 Pain location: Lt knee, Lt achilles Pain description: constant, throbbing,  Aggravating factors: WB activity Relieving factors: nothing specific   PRECAUTIONS: None  WEIGHT BEARING RESTRICTIONS: No  FALLS:  Has patient fallen in last 6 months? 1 fall without medical attention.   LIVING ENVIRONMENT: Lives in: House/apartment Stairs: multistory with bedroom upstairs but has moved bed downstairs.   Rail on Lt side going up.   OCCUPATION: Retired   PLOF: Independent, walking for exercise (5-6 miles/day in the past until 2012).  Dog walking   PATIENT GOALS: Reduce pain, move better, walk better without cane.   OBJECTIVE: (objective measures completed at initial evaluation unless otherwise dated)   PATIENT SURVEYS:  03/26/2023:  FOTO update:  44  02/16/2023 FOTO intake:  38  predicted:  54  COGNITION: 02/16/2023 Overall cognitive status: WFL    SENSATION: 02/16/2023 No specific check today  EDEMA:  02/16/2023 Lt leg edema noted from knee down compared to Lt.   MUSCLE LENGTH: 02/16/2023 No specific testing  POSTURE:  02/16/2023 Reduced lumbar lordosis in standing, Weight shift off Lt leg to Rt.   PALPATION: 02/16/2023 Tenderness around swollen Lt ankle, Lt knee to light touch.   LOWER EXTREMITY ROM:   ROM Right 02/16/2023 Left 02/16/2023 Left 02/21/2023 Left 03/02/2023 Left 03/26/2023  Hip flexion       Hip extension       Hip abduction       Hip adduction       Hip internal rotation       Hip external rotation       Knee flexion 125 92 AROM in supine heel slide c pain PROM 95/100 grossly assesed in sitting.  90 AROM in supine heel slide c pain 96 AROM in supine heel slide c pain  Knee extension 0 0 AROM in quad set supine  0 AROM in seated quad set   Ankle dorsiflexion       Ankle plantarflexion       Ankle inversion        Ankle eversion        (Blank rows = not tested)  LOWER EXTREMITY MMT:  MMT Right 02/16/2023 Left 02/16/2023 Left 03/13/2023 Right 03/26/2023 Left 03/26/2023 Left 04/10/2023  Hip flexion 5/5 4/5      Hip extension        Hip abduction        Hip adduction        Hip internal rotation        Hip external rotation        Knee flexion 5/5 4/5 4/5  4+/5   Knee extension 4/5 3+/5 4-/5 5/5 52.9, 52 lbs  4-/5  15.9, 16.5 lbs with pain   Ankle dorsiflexion 5/5 3+/5 c pain 4/5  4+/5 5/5  Ankle plantarflexion  2+/5 c pain (unable to perform standing PF) 2+/5 c  pain (unable to perform standing PF)  2+/5   Ankle inversion 5/5 3+/5 c pain 4/5 c pain  4+/5   Ankle eversion 5/5 3+/5 c pain 3+/5 c pain  4/5    (Blank rows = not tested)  LOWER EXTREMITY SPECIAL TESTS:  02/16/2023 No specific testing today.   FUNCTIONAL TESTS:  03/26/2023:  18 inch chair transfer: 2 tries required  02/16/2023 18 inch chair transfer: multiple tries required c noted deviation to Rt leg Lt SLS:  < 3 seconds Rt SLS: < 3 seconds  GAIT: 02/16/2023 Mod independent ambulation c SPC in Rt UE, decreased stance on Lt leg, decreased toe off progression noted.                                                                                                                                                                         TODAY'S TREATMENT                                                                             DATE: 04/10/2023 Therapeutic Exercise: Long sitting green band inversion, eversion, DF 2 x 15 each direction  Long sitting blue band eccentric 3 x 10 PF Lt ankle.  Nustep Lvl lvl 6 10  mins UE/LE Seated LAQ pause in end range each direction x 20  Additional time required for rest, recovery and slow movement control focus on intervention.     Modalities: Vaso Lt knee/ankle combo, medium compression 34 deg , supine w/ LE elevation  TODAY'S TREATMENT                                                                              DATE: 04/06/23 Pt seen for aquatic therapy today.  Treatment took place in water 3.5-4.75 ft in depth at the [facility] pool. Temp of water was 91.  Pt entered/exited the pool via stairs with hand rail.   Pt requires the buoyancy and hydrostatic pressure of water for support, and to offload joints by unweighting joint load by at least 50 % in navel deep water and by at least 75-80% in chest to neck deep water.  Viscosity  of the water is needed for resistance of strengthening. Water current perturbations provides challenge to standing balance requiring increased core activation. Aquatic PT exercises Sidestepping length of pool 3 round trips with mini squat Forward walking  3 round trips backward walking 3 round trips March walking 3 round trips with water dumbells Tandem walk 3 round trips with water dumbells Leg swings hip abd/add X 15 bilat with UE support with UE support Leg swings hip flexion/extension X 15 bilat Gastroc stretch 30 sec X 3 off of first step Step ups onto first step of pool X10 bilat with UE support Heel and toe raises 2X10 with UE support off of first step Squats 2X10 with UE support off of first step Hip circles CW and CCW X 15 each bilat 1/4 squat with shoulder extension X20 with kickboard 1/4 lunge with push/pull kickboard 2X15 Lunge step to pool wall, touch wall, then step back, X 15 bilat Monster walks 3 round trips Seated on pool chair and cycling 3 min    TODAY'S TREATMENT                                                                             DATE: 04/02/2023 Therapeutic Exercise: Nustep Lvl 10.5 mins UE/LE Double leg 87 lbs x15, single leg  x 15 -performed bilaterally 31 lbs  Long sitting green band inversion, eversion, PF, DF x 20 Lt ankle Seated Lt hamstring curl green band 2 x 15   Neuro Re-ed Forward BIG inspired stepping for weight shift/balance improvements x 12 each side Reverse BIG inspired stepping for weight  shift/balance improvements x 10 each side   Modalities: Vaso Lt knee/ankle combo, medium compression 34 deg , supine w/ LE elevation     PATIENT EDUCATION:  03/26/2023  Education details: HEP update Person educated: Patient Education method: Programmer, multimedia, Demonstration, Verbal cues, and Handouts Education comprehension: verbalized understanding, returned demonstration, and verbal cues required  HOME EXERCISE PROGRAM: Access Code: [email or web address] URL: [email or web address] Date: 03/26/2023 Prepared by: Chyrel Masson  Exercises - Supine Heel Slide (Mirrored)  - 3-5 x daily - 7 x weekly - 1-2 sets - 10 reps - 2 hold - Seated Long Arc Quad (Mirrored)  - 3-5 x daily - 7 x weekly - 1-2 sets - 10 reps - 2 hold - Seated Gastroc Stretch with Strap (Mirrored)  - 2-3 x daily - 7 x weekly - 1 sets - 3 reps - 30 hold - Seated Quad Set (Mirrored)  - 3-5 x daily - 7 x weekly - 1 sets - 10 reps - 5 hold - Seated Straight Leg Heel Taps  - 1-2 x daily - 7 x weekly - 2-3 sets - 10-15 reps - 2-3 hold - Long Sitting Eccentric Ankle Plantar Flexion with Resistance  - 1-2 x daily - 7 x weekly - 3 sets - 10 reps - Long Sitting Ankle Eversion with Resistance  - 1 x daily - 7 x weekly - 1-2 sets - 10-15 reps - Long Sitting Ankle Inversion with Resistance  - 1 x daily - 7 x weekly - 1-2 sets - 10-15 reps - Seated Ankle Dorsiflexion with Resistance  - 1 x daily - 7 x weekly - 1-2 sets -  10-15 reps  ASSESSMENT:  CLINICAL IMPRESSION: Limited today in part due to general ache/pain symptoms in whole body in addition to chief complaints.  Improving quality of ankle movement in strengthening noted today despite general symptoms.    OBJECTIVE IMPAIRMENTS: Abnormal gait, decreased activity tolerance, decreased balance, decreased coordination, decreased endurance, decreased mobility, difficulty walking, decreased ROM, decreased strength, hypomobility, increased edema, impaired perceived functional  ability, impaired flexibility, improper body mechanics, postural dysfunction, and pain.   ACTIVITY LIMITATIONS: carrying, lifting, bending, sitting, standing, squatting, sleeping, stairs, transfers, bed mobility, bathing, toileting, dressing, and locomotion level  PARTICIPATION LIMITATIONS: meal prep, cleaning, laundry, interpersonal relationship, driving, shopping, and community activity  PERSONAL FACTORS:  History of 05/2022 lumbar surgery, GERD, COPD, HTN, history of arthritis, multiple treatment areas current  are also affecting patient's functional outcome.   REHAB POTENTIAL: Fair to good  CLINICAL DECISION MAKING: Evolving/moderate complexity  EVALUATION COMPLEXITY: Moderate   GOALS: Goals reviewed with patient? Yes  SHORT TERM GOALS: (target date for Short term goals are 3 weeks 03/09/2023)   1.  Patient will demonstrate independent use of home exercise program to maintain progress from in clinic treatments.  Goal status: Met   LONG TERM GOALS: (target dates for all long term goals are 10 weeks  04/27/2023 )   1. Patient will demonstrate/report pain at worst less than or equal to 2/10 to facilitate minimal limitation in daily activity secondary to pain symptoms.  Goal status: on going 03/26/2023   2. Patient will demonstrate independent use of home exercise program to facilitate ability to maintain/progress functional gains from skilled physical therapy services.  Goal status: on going 03/26/2023   3. Patient will demonstrate FOTO outcome > or = 54 % to indicate reduced disability due to condition.  Goal status: on going 03/26/2023   4.  Patient will demonstrate Lt LE MMT 5/5 throughout to faciltiate usual transfers, stairs, squatting at [facility] for daily life.   Goal status: on going 03/26/2023   5.  Patient will demonstrate ability to ascend/descend stairs reciprocal gait pattern with single hand rail assist.  Goal status: on going 03/26/2023   6.  Patient will demonstrate Lt  knee AROM 0-120 deg to facilitate ability to perform transfers, ambulation and other activity in daily life.  Goal status: on going 03/26/2023   7.  Patient will demonstrate ability to ambulation community distances > 500 ft.  Goal Status: on going 03/26/2023   PLAN:  PT FREQUENCY: 1-2x/week  PT DURATION: 10 weeks  PLANNED INTERVENTIONS: Therapeutic exercises, Therapeutic activity, Neuro Muscular re-education, Balance training, Gait training, Patient/Family education, Joint mobilization, Stair training, DME instructions, Dry Needling, Electrical stimulation, Traction, Cryotherapy, vasopneumatic deviceMoist heat, Taping, Ultrasound, Ionotophoresis 4mg /ml Dexamethasone, and aquatic therapy, Manual therapy.  All included unless contraindicated  PLAN FOR NEXT SESSION: Return to [facility] activity as able. Check ROM of knee/ankle updates.    Chyrel Masson, PT, DPT, OCS, ATC 04/10/23  12:30 PM

## 2023-04-11 ENCOUNTER — Encounter: Payer: Self-pay | Admitting: Internal Medicine

## 2023-04-11 ENCOUNTER — Telehealth: Payer: Self-pay | Admitting: Internal Medicine

## 2023-04-11 NOTE — Telephone Encounter (Signed)
Please to call pt -   Repeat UA unfortunately did show small amount of new blood that showed up on the UA from last visit as well  We need to ask her to see Urology for this new small blood to make sure there is no new underlying problem, even cancer that does not otherwise show up  I have done a repeat new Urology referral, and hopefully she will hear soon about this, thanks

## 2023-04-11 NOTE — Assessment & Plan Note (Signed)
Repeat UA with persistent new microhematuria - will need to encourage pt to still urology by trying urology referral again

## 2023-04-11 NOTE — Assessment & Plan Note (Signed)
Lab Results  Component Value Date   HGBA1C 6.1 04/09/2023   Stable, pt to continue current medical treatment  - diet, wt control

## 2023-04-11 NOTE — Telephone Encounter (Signed)
Called and let Pt know

## 2023-04-11 NOTE — Assessment & Plan Note (Signed)
Last vitamin D Lab Results  Component Value Date   VD25OH 31.79 10/05/2022   Low, to start oral replacement

## 2023-04-11 NOTE — Assessment & Plan Note (Signed)
BP Readings from Last 3 Encounters:  04/09/23 122/82  04/05/23 120/78  11/29/22 120/77   Stable, pt to continue medical treatment hct 12.5 every day, or losartan 25 mg qd

## 2023-04-11 NOTE — Assessment & Plan Note (Signed)
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

## 2023-04-11 NOTE — Assessment & Plan Note (Signed)
Lab Results  Component Value Date   LDLCALC 132 (H) 04/09/2023   uncontrolled, pt wary of statins due to possible muscle pain, will ask her to take zetia 10 mg qd

## 2023-04-13 ENCOUNTER — Ambulatory Visit (INDEPENDENT_AMBULATORY_CARE_PROVIDER_SITE_OTHER): Payer: Medicare Other | Admitting: Physical Therapy

## 2023-04-13 ENCOUNTER — Encounter: Payer: Self-pay | Admitting: Physical Therapy

## 2023-04-13 ENCOUNTER — Telehealth: Payer: Self-pay | Admitting: Internal Medicine

## 2023-04-13 DIAGNOSIS — M79662 Pain in left lower leg: Secondary | ICD-10-CM

## 2023-04-13 DIAGNOSIS — M25562 Pain in left knee: Secondary | ICD-10-CM | POA: Diagnosis not present

## 2023-04-13 DIAGNOSIS — R600 Localized edema: Secondary | ICD-10-CM | POA: Diagnosis not present

## 2023-04-13 DIAGNOSIS — G8929 Other chronic pain: Secondary | ICD-10-CM

## 2023-04-13 DIAGNOSIS — R262 Difficulty in walking, not elsewhere classified: Secondary | ICD-10-CM

## 2023-04-13 DIAGNOSIS — M6281 Muscle weakness (generalized): Secondary | ICD-10-CM

## 2023-04-13 MED ORDER — EZETIMIBE 10 MG PO TABS: 10.0000 mg | ORAL_TABLET | Freq: Every day | ORAL | 3 refills | Status: DC

## 2023-04-13 NOTE — Telephone Encounter (Signed)
Sorry, I dont have anything less expensive, unless she is willing to take generic crestor or lipitor, just let me know thanks

## 2023-04-13 NOTE — Therapy (Signed)
OUTPATIENT PHYSICAL THERAPY TREATMENT   Patient Name: Frances Nelson MRN: [id number] DOB:05-15-1956, 67 y.o., female Today's Date: 04/13/2023    END OF SESSION:  PT End of Session - 04/13/23 0900     Visit Number 15    Number of Visits 20    Date for PT Re-Evaluation 04/27/23    Authorization Type MCR    Progress Note Due on Visit 20    PT Start Time [phone]    PT Stop Time 0930    PT Time Calculation (min) 38 min    Activity Tolerance Patient limited by pain    Behavior During Therapy WFL for tasks assessed/performed                     Past Medical History:  Diagnosis Date   Arthritis    Asthma    COPD (chronic obstructive pulmonary disease) (HCC)    GERD (gastroesophageal reflux disease)    Hypertension    Pneumonia    Past Surgical History:  Procedure Laterality Date   ABDOMINAL HYSTERECTOMY     APPENDECTOMY     HERNIA REPAIR     Umbilical x 3   KNEE ARTHROSCOPY W/ MENISCAL REPAIR Left 12/2022   TONSILLECTOMY     TRANSFORAMINAL LUMBAR INTERBODY FUSION (TLIF) WITH PEDICLE SCREW FIXATION 1 LEVEL N/A 05/18/2022   Procedure: Lumbar Four-Five Open Laminectomy/Transforaminal Lumbar Interbody Fusion/Posterolateral fusion;  Surgeon: Jadene Pierini, MD;  Location: [facility] OR;  Service: Neurosurgery;  Laterality: N/A;   Patient Active Problem List   Diagnosis Date Noted   Microhematuria 04/09/2023   Bilateral shoulder pain 04/09/2023   Urethritis 10/05/2022   External otitis of left ear 10/05/2022   Spondylolisthesis of lumbar region 05/18/2022   Smoker 04/08/2022   Deviated nasal septum 04/08/2022   Bilateral pain of leg and foot 04/08/2022   Follicular acne 04/06/2022   Baker cyst, left 04/06/2022   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   Depression 10/04/2021   Left knee pain 10/04/2021   Chronic pain 04/09/2021   Pelvic pain 04/09/2021   HLD (hyperlipidemia) 04/09/2021   Vitamin D deficiency 04/09/2021    Estrogen deficiency 04/09/2021   Hyperglycemia 04/01/2021   Chronic cough 12/30/2020   GERD (gastroesophageal reflux disease) 11/06/2019   Bilateral leg edema 10/16/2019   Pleuritic chest pain 10/16/2019   Hypertension 10/02/2019   Arthralgia 06/28/2018   Sinusitis 06/28/2018   Renal cyst 05/03/2018   Syncope 05/03/2018   Stress and adjustment reaction 05/03/2018   History of tobacco use 05/02/2018   Primary osteoarthritis of both hands 04/10/2018   Primary osteoarthritis of both knees 04/10/2018   Primary osteoarthritis of both feet 04/10/2018   Achilles tendinitis 04/02/2018   Screening for breast cancer 03/15/2018   Positive ANA (antinuclear antibody) 03/15/2018   Screening for colon cancer 03/15/2018   Rash 03/15/2018   Achilles tendon pain 11/13/2017   Cervical myelopathy with cervical radiculopathy (HCC) 11/13/2017   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   Allergic rhinitis 06/19/2017   Edema 12/24/2015   Carpal tunnel syndrome, right 12/16/2015   Hypercalcemia 11/11/2015   Pain in both upper extremities 11/11/2015   Drug reaction 10/28/2015   Muscle spasms of both lower extremities 10/28/2015   Nerve pain 10/28/2015   Chronic asthmatic bronchitis with acute exacerbation (HCC) 09/06/2015   Environmental and seasonal allergies 07/17/2014    PCP: Corwin Levins MD  REFERRING PROVIDER: Kathryne Hitch, MD  REFERRING DIAG: [phone] (ICD-10-CM) - Status  post arthroscopic surgery of left knee M76.60 (ICD-10-CM) - Achilles tendon pain  THERAPY DIAG:  Chronic pain of left knee  Pain in left lower leg  Muscle weakness (generalized)  Localized edema  Difficulty in walking, not elsewhere classified  Rationale for Evaluation and Treatment: Rehabilitation  ONSET DATE: Surgery 12/21/2022 on Lt knee  SUBJECTIVE:   SUBJECTIVE STATEMENT: She indicates pain in left knee and ankle that is burning and achey, thinks it is a little worse today due to the storm.   PERTINENT  HISTORY: History of 05/2022 lumbar surgery, GERD, COPD, HTN, history of arthritis  PAIN:  NPRS scale: Lt ankle:  7.5/10, Lt knee 6/10 Pain location: Lt knee, Lt achilles Pain description: constant, throbbing,  Aggravating factors: WB activity Relieving factors: nothing specific   PRECAUTIONS: None  WEIGHT BEARING RESTRICTIONS: No  FALLS:  Has patient fallen in last 6 months? 1 fall without medical attention.   LIVING ENVIRONMENT: Lives in: House/apartment Stairs: multistory with bedroom upstairs but has moved bed downstairs.   Rail on Lt side going up.   OCCUPATION: Retired   PLOF: Independent, walking for exercise (5-6 miles/day in the past until 2012).  Dog walking   PATIENT GOALS: Reduce pain, move better, walk better without cane.   OBJECTIVE: (objective measures completed at initial evaluation unless otherwise dated)   PATIENT SURVEYS:  03/26/2023:  FOTO update:  44  02/16/2023 FOTO intake:  38  predicted:  54  COGNITION: 02/16/2023 Overall cognitive status: WFL    SENSATION: 02/16/2023 No specific check today  EDEMA:  02/16/2023 Lt leg edema noted from knee down compared to Lt.   MUSCLE LENGTH: 02/16/2023 No specific testing  POSTURE:  02/16/2023 Reduced lumbar lordosis in standing, Weight shift off Lt leg to Rt.   PALPATION: 02/16/2023 Tenderness around swollen Lt ankle, Lt knee to light touch.   LOWER EXTREMITY ROM:   ROM Right 02/16/2023 Left 02/16/2023 Left 02/21/2023 Left 03/02/2023 Left 03/26/2023  Hip flexion       Hip extension       Hip abduction       Hip adduction       Hip internal rotation       Hip external rotation       Knee flexion 125 92 AROM in supine heel slide c pain PROM 95/100 grossly assesed in sitting.  90 AROM in supine heel slide c pain 96 AROM in supine heel slide c pain  Knee extension 0 0 AROM in quad set supine  0 AROM in seated quad set   Ankle dorsiflexion       Ankle plantarflexion       Ankle inversion       Ankle eversion         (Blank rows = not tested)  LOWER EXTREMITY MMT:  MMT Right 02/16/2023 Left 02/16/2023 Left 03/13/2023 Right 03/26/2023 Left 03/26/2023 Left 04/10/2023  Hip flexion 5/5 4/5      Hip extension        Hip abduction        Hip adduction        Hip internal rotation        Hip external rotation        Knee flexion 5/5 4/5 4/5  4+/5   Knee extension 4/5 3+/5 4-/5 5/5 52.9, 52 lbs  4-/5  15.9, 16.5 lbs with pain   Ankle dorsiflexion 5/5 3+/5 c pain 4/5  4+/5 5/5  Ankle plantarflexion  2+/5 c pain (unable to perform  standing PF) 2+/5 c pain (unable to perform standing PF)  2+/5   Ankle inversion 5/5 3+/5 c pain 4/5 c pain  4+/5   Ankle eversion 5/5 3+/5 c pain 3+/5 c pain  4/5    (Blank rows = not tested)  LOWER EXTREMITY SPECIAL TESTS:  02/16/2023 No specific testing today.   FUNCTIONAL TESTS:  03/26/2023:  18 inch chair transfer: 2 tries required  02/16/2023 18 inch chair transfer: multiple tries required c noted deviation to Rt leg Lt SLS:  < 3 seconds Rt SLS: < 3 seconds  GAIT: 02/16/2023 Mod independent ambulation c SPC in Rt UE, decreased stance on Lt leg, decreased toe off progression noted.                                                                                                                                                                         TODAY'S TREATMENT                                                                             DATE:  TODAY'S TREATMENT                                                                             DATE: 04/13/23 Pt seen for aquatic therapy today.  Treatment took place in water 3.5-4.75 ft in depth at the [facility] pool. Temp of water was 91.  Pt entered/exited the pool via stairs with hand rail.   Pt requires the buoyancy and hydrostatic pressure of water for support, and to offload joints by unweighting joint load by at least 50 % in navel deep water and by at least 75-80% in chest to neck deep water.  Viscosity of  the water is needed for resistance of strengthening. Water current perturbations provides challenge to standing balance requiring increased core activation. Aquatic PT exercises Sidestepping length of pool 3 round trips with mini squat Forward walking  3 round trips backward walking 3 round trips March walking 3 round trips with water dumbells Tandem walk 3 round trips with water dumbells Leg swings hip abd/add X 20 bilat with UE support with UE support  Leg swings hip flexion/extension X 20 bilat Gastroc stretch 30 sec X 3 off of first step Step ups onto first step of pool X10 bilat with UE support Heel and toe raises 2X10 with UE support off of first step Squats 2X10 with UE support off of first step Hip circles CW and CCW X 15 each bilat 1/4 squat with shoulder extension X20 with kickboard 1/4 lunge with push/pull kickboard 2X15 Lunge step to pool wall, touch wall, then step back, X 15 bilat Monster walks 3 round trips Seated on pool chair and cycling 3 min 04/10/2023 Therapeutic Exercise: Long sitting green band inversion, eversion, DF 2 x 15 each direction  Long sitting blue band eccentric 3 x 10 PF Lt ankle.  Nustep Lvl 10 lvl 6 mins UE/LE Seated LAQ pause in end range each direction x 20  Additional time required for rest, recovery and slow movement control focus on intervention.     Modalities: Vaso Lt knee/ankle combo, medium compression 34 deg , supine w/ LE elevation   TODAY'S TREATMENT                                                                             DATE: 04/02/2023 Therapeutic Exercise: Nustep Lvl 10.5 mins UE/LE Double leg 87 lbs x15, single leg  x 15 -performed bilaterally 31 lbs  Long sitting green band inversion, eversion, PF, DF x 20 Lt ankle Seated Lt hamstring curl green band 2 x 15   Neuro Re-ed Forward BIG inspired stepping for weight shift/balance improvements x 12 each side Reverse BIG inspired stepping for weight shift/balance  improvements x 10 each side   Modalities: Vaso Lt knee/ankle combo, medium compression 34 deg , supine w/ LE elevation     PATIENT EDUCATION:  03/26/2023  Education details: HEP update Person educated: Patient Education method: Programmer, multimedia, Demonstration, Verbal cues, and Handouts Education comprehension: verbalized understanding, returned demonstration, and verbal cues required  HOME EXERCISE PROGRAM: Access Code: [email or web address] URL: [email or web address] Date: 03/26/2023 Prepared by: Chyrel Masson  Exercises - Supine Heel Slide (Mirrored)  - 3-5 x daily - 7 x weekly - 1-2 sets - 10 reps - 2 hold - Seated Long Arc Quad (Mirrored)  - 3-5 x daily - 7 x weekly - 1-2 sets - 10 reps - 2 hold - Seated Gastroc Stretch with Strap (Mirrored)  - 2-3 x daily - 7 x weekly - 1 sets - 3 reps - 30 hold - Seated Quad Set (Mirrored)  - 3-5 x daily - 7 x weekly - 1 sets - 10 reps - 5 hold - Seated Straight Leg Heel Taps  - 1-2 x daily - 7 x weekly - 2-3 sets - 10-15 reps - 2-3 hold - Long Sitting Eccentric Ankle Plantar Flexion with Resistance  - 1-2 x daily - 7 x weekly - 3 sets - 10 reps - Long Sitting Ankle Eversion with Resistance  - 1 x daily - 7 x weekly - 1-2 sets - 10-15 reps - Long Sitting Ankle Inversion with Resistance  - 1 x daily - 7 x weekly - 1-2 sets - 10-15 reps - Seated Ankle Dorsiflexion with Resistance  - 1 x daily - 7 x weekly -  1-2 sets - 10-15 reps  ASSESSMENT:  CLINICAL IMPRESSION: Despite more overall pain upon arrival had good activity tolerance to aquatic PT session. She will continue to benefit from PT    OBJECTIVE IMPAIRMENTS: Abnormal gait, decreased activity tolerance, decreased balance, decreased coordination, decreased endurance, decreased mobility, difficulty walking, decreased ROM, decreased strength, hypomobility, increased edema, impaired perceived functional ability, impaired flexibility, improper body mechanics, postural dysfunction, and pain.    ACTIVITY LIMITATIONS: carrying, lifting, bending, sitting, standing, squatting, sleeping, stairs, transfers, bed mobility, bathing, toileting, dressing, and locomotion level  PARTICIPATION LIMITATIONS: meal prep, cleaning, laundry, interpersonal relationship, driving, shopping, and community activity  PERSONAL FACTORS:  History of 05/2022 lumbar surgery, GERD, COPD, HTN, history of arthritis, multiple treatment areas current  are also affecting patient's functional outcome.   REHAB POTENTIAL: Fair to good  CLINICAL DECISION MAKING: Evolving/moderate complexity  EVALUATION COMPLEXITY: Moderate   GOALS: Goals reviewed with patient? Yes  SHORT TERM GOALS: (target date for Short term goals are 3 weeks 03/09/2023)   1.  Patient will demonstrate independent use of home exercise program to maintain progress from in clinic treatments.  Goal status: Met   LONG TERM GOALS: (target dates for all long term goals are 10 weeks  04/27/2023 )   1. Patient will demonstrate/report pain at worst less than or equal to 2/10 to facilitate minimal limitation in daily activity secondary to pain symptoms.  Goal status: on going 03/26/2023   2. Patient will demonstrate independent use of home exercise program to facilitate ability to maintain/progress functional gains from skilled physical therapy services.  Goal status: on going 03/26/2023   3. Patient will demonstrate FOTO outcome > or = 54 % to indicate reduced disability due to condition.  Goal status: on going 03/26/2023   4.  Patient will demonstrate Lt LE MMT 5/5 throughout to faciltiate usual transfers, stairs, squatting at [facility] for daily life.   Goal status: on going 03/26/2023   5.  Patient will demonstrate ability to ascend/descend stairs reciprocal gait pattern with single hand rail assist.  Goal status: on going 03/26/2023   6.  Patient will demonstrate Lt knee AROM 0-120 deg to facilitate ability to perform transfers, ambulation and other  activity in daily life.  Goal status: on going 03/26/2023   7.  Patient will demonstrate ability to ambulation community distances > 500 ft.  Goal Status: on going 03/26/2023   PLAN:  PT FREQUENCY: 1-2x/week  PT DURATION: 10 weeks  PLANNED INTERVENTIONS: Therapeutic exercises, Therapeutic activity, Neuro Muscular re-education, Balance training, Gait training, Patient/Family education, Joint mobilization, Stair training, DME instructions, Dry Needling, Electrical stimulation, Traction, Cryotherapy, vasopneumatic deviceMoist heat, Taping, Ultrasound, Ionotophoresis 4mg /ml Dexamethasone, and aquatic therapy, Manual therapy.  All included unless contraindicated  PLAN FOR NEXT SESSION: Return to [facility] activity as able. Check ROM of knee/ankle updates.   Ivery Quale, PT, DPT 04/13/23 9:02 AM

## 2023-04-13 NOTE — Telephone Encounter (Signed)
I think this would be very odd, as it sounds like she may have been quoted the Brand Name price.  The zetia generic is resent to the pharmacy - and should be less expensive than $97

## 2023-04-13 NOTE — Telephone Encounter (Signed)
Patient went to pick up her Zetia at her pharmacy and it was 97 dollars.  She would like something else sent in - patient states she can not afford this.  Please advise

## 2023-04-15 ENCOUNTER — Ambulatory Visit
Admission: RE | Admit: 2023-04-15 | Discharge: 2023-04-15 | Disposition: A | Discharge status: Home / Self Care | Payer: Medicare Other | Source: Ambulatory Visit | Attending: Orthopaedic Surgery | Admitting: Orthopaedic Surgery

## 2023-04-15 DIAGNOSIS — R2242 Localized swelling, mass and lump, left lower limb: Secondary | ICD-10-CM

## 2023-04-15 MED ORDER — GADOPICLENOL 0.5 MMOL/ML IV SOLN
10.0000 mL | Freq: Once | INTRAVENOUS | Status: AC | PRN
  Administered 2023-04-15: 10 mL via INTRAVENOUS

## 2023-04-16 ENCOUNTER — Encounter: Payer: Self-pay | Admitting: Rehabilitative and Restorative Service Providers"

## 2023-04-16 ENCOUNTER — Ambulatory Visit (INDEPENDENT_AMBULATORY_CARE_PROVIDER_SITE_OTHER): Payer: Medicare Other | Admitting: Rehabilitative and Restorative Service Providers"

## 2023-04-16 DIAGNOSIS — R600 Localized edema: Secondary | ICD-10-CM | POA: Diagnosis not present

## 2023-04-16 DIAGNOSIS — M79662 Pain in left lower leg: Secondary | ICD-10-CM

## 2023-04-16 DIAGNOSIS — R262 Difficulty in walking, not elsewhere classified: Secondary | ICD-10-CM

## 2023-04-16 DIAGNOSIS — G8929 Other chronic pain: Secondary | ICD-10-CM

## 2023-04-16 DIAGNOSIS — M6281 Muscle weakness (generalized): Secondary | ICD-10-CM

## 2023-04-16 DIAGNOSIS — M25562 Pain in left knee: Secondary | ICD-10-CM | POA: Diagnosis not present

## 2023-04-16 NOTE — Therapy (Signed)
OUTPATIENT PHYSICAL THERAPY TREATMENT   Patient Name: Frances Nelson MRN: [id number] DOB:1955-11-23, 67 y.o., female Today's Date: 04/16/2023    END OF SESSION:  PT End of Session - 04/16/23 1102     Visit Number 16    Number of Visits 20    Date for PT Re-Evaluation 04/27/23    Authorization Type MCR    Progress Note Due on Visit 20    PT Start Time 1057    PT Stop Time 1146    PT Time Calculation (min) 49 min    Activity Tolerance Patient limited by pain    Behavior During Therapy WFL for tasks assessed/performed              Past Medical History:  Diagnosis Date   Arthritis    Asthma    COPD (chronic obstructive pulmonary disease) (HCC)    GERD (gastroesophageal reflux disease)    Hypertension    Pneumonia    Past Surgical History:  Procedure Laterality Date   ABDOMINAL HYSTERECTOMY     APPENDECTOMY     HERNIA REPAIR     Umbilical x 3   KNEE ARTHROSCOPY W/ MENISCAL REPAIR Left 12/2022   TONSILLECTOMY     TRANSFORAMINAL LUMBAR INTERBODY FUSION (TLIF) WITH PEDICLE SCREW FIXATION 1 LEVEL N/A 05/18/2022   Procedure: Lumbar Four-Five Open Laminectomy/Transforaminal Lumbar Interbody Fusion/Posterolateral fusion;  Surgeon: Jadene Pierini, MD;  Location: [facility] OR;  Service: Neurosurgery;  Laterality: N/A;   Patient Active Problem List   Diagnosis Date Noted   Microhematuria 04/09/2023   Bilateral shoulder pain 04/09/2023   Urethritis 10/05/2022   External otitis of left ear 10/05/2022   Spondylolisthesis of lumbar region 05/18/2022   Smoker 04/08/2022   Deviated nasal septum 04/08/2022   Bilateral pain of leg and foot 04/08/2022   Follicular acne 04/06/2022   Baker cyst, left 04/06/2022   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   Depression 10/04/2021   Left knee pain 10/04/2021   Chronic pain 04/09/2021   Pelvic pain 04/09/2021   HLD (hyperlipidemia) 04/09/2021   Vitamin D deficiency 04/09/2021   Estrogen deficiency  04/09/2021   Hyperglycemia 04/01/2021   Chronic cough 12/30/2020   GERD (gastroesophageal reflux disease) 11/06/2019   Bilateral leg edema 10/16/2019   Pleuritic chest pain 10/16/2019   Hypertension 10/02/2019   Arthralgia 06/28/2018   Sinusitis 06/28/2018   Renal cyst 05/03/2018   Syncope 05/03/2018   Stress and adjustment reaction 05/03/2018   History of tobacco use 05/02/2018   Primary osteoarthritis of both hands 04/10/2018   Primary osteoarthritis of both knees 04/10/2018   Primary osteoarthritis of both feet 04/10/2018   Achilles tendinitis 04/02/2018   Screening for breast cancer 03/15/2018   Positive ANA (antinuclear antibody) 03/15/2018   Screening for colon cancer 03/15/2018   Rash 03/15/2018   Achilles tendon pain 11/13/2017   Cervical myelopathy with cervical radiculopathy (HCC) 11/13/2017   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   Allergic rhinitis 06/19/2017   Edema 12/24/2015   Carpal tunnel syndrome, right 12/16/2015   Hypercalcemia 11/11/2015   Pain in both upper extremities 11/11/2015   Drug reaction 10/28/2015   Muscle spasms of both lower extremities 10/28/2015   Nerve pain 10/28/2015   Chronic asthmatic bronchitis with acute exacerbation (HCC) 09/06/2015   Environmental and seasonal allergies 07/17/2014    PCP: Corwin Levins MD  REFERRING PROVIDER: Kathryne Hitch, MD  REFERRING DIAG: [phone] (ICD-10-CM) - Status post arthroscopic surgery of left knee M76.60 (  ICD-10-CM) - Achilles tendon pain  THERAPY DIAG:  Chronic pain of left knee  Pain in left lower leg  Muscle weakness (generalized)  Localized edema  Difficulty in walking, not elsewhere classified  Rationale for Evaluation and Treatment: Rehabilitation  ONSET DATE: Surgery 12/21/2022 on Lt knee  SUBJECTIVE:   SUBJECTIVE STATEMENT: Pt indicated having MRI done yesterday on knee.  Pt indicated symptoms upon arrival with 5-6/10 for knee upon arrival.  Ankle stays constant around  7-7.510  PERTINENT HISTORY: History of 05/2022 lumbar surgery, GERD, COPD, HTN, history of arthritis  PAIN:  NPRS scale: Lt ankle:  7.5/10, Lt knee 6/10 Pain location: Lt knee, Lt achilles Pain description: constant, throbbing,  Aggravating factors: WB activity Relieving factors: nothing specific   PRECAUTIONS: None  WEIGHT BEARING RESTRICTIONS: No  FALLS:  Has patient fallen in last 6 months? 1 fall without medical attention.   LIVING ENVIRONMENT: Lives in: House/apartment Stairs: multistory with bedroom upstairs but has moved bed downstairs.   Rail on Lt side going up.   OCCUPATION: Retired   PLOF: Independent, walking for exercise (5-6 miles/day in the past until 2012).  Dog walking   PATIENT GOALS: Reduce pain, move better, walk better without cane.   OBJECTIVE: (objective measures completed at initial evaluation unless otherwise dated)   PATIENT SURVEYS:  03/26/2023:  FOTO update:  44  02/16/2023 FOTO intake:  38  predicted:  54  COGNITION: 02/16/2023 Overall cognitive status: WFL    SENSATION: 02/16/2023 No specific check today  EDEMA:  02/16/2023 Lt leg edema noted from knee down compared to Lt.   MUSCLE LENGTH: 02/16/2023 No specific testing  POSTURE:  02/16/2023 Reduced lumbar lordosis in standing, Weight shift off Lt leg to Rt.   PALPATION: 02/16/2023 Tenderness around swollen Lt ankle, Lt knee to light touch.   LOWER EXTREMITY ROM:   ROM Right 02/16/2023 Left 02/16/2023 Left 02/21/2023 Left 03/02/2023 Left 03/26/2023  Hip flexion       Hip extension       Hip abduction       Hip adduction       Hip internal rotation       Hip external rotation       Knee flexion 125 92 AROM in supine heel slide c pain PROM 95/100 grossly assesed in sitting.  90 AROM in supine heel slide c pain 96 AROM in supine heel slide c pain  Knee extension 0 0 AROM in quad set supine  0 AROM in seated quad set   Ankle dorsiflexion       Ankle plantarflexion       Ankle inversion        Ankle eversion        (Blank rows = not tested)  LOWER EXTREMITY MMT:  MMT Right 02/16/2023 Left 02/16/2023 Left 03/13/2023 Right 03/26/2023 Left 03/26/2023 Left 04/10/2023  Hip flexion 5/5 4/5      Hip extension        Hip abduction        Hip adduction        Hip internal rotation        Hip external rotation        Knee flexion 5/5 4/5 4/5  4+/5   Knee extension 4/5 3+/5 4-/5 5/5 52.9, 52 lbs  4-/5  15.9, 16.5 lbs with pain   Ankle dorsiflexion 5/5 3+/5 c pain 4/5  4+/5 5/5  Ankle plantarflexion  2+/5 c pain (unable to perform standing PF) 2+/5 c pain (unable  to perform standing PF)  2+/5   Ankle inversion 5/5 3+/5 c pain 4/5 c pain  4+/5   Ankle eversion 5/5 3+/5 c pain 3+/5 c pain  4/5    (Blank rows = not tested)  LOWER EXTREMITY SPECIAL TESTS:  02/16/2023 No specific testing today.   FUNCTIONAL TESTS:  04/16/2023:   Lt SLS: 4 seconds Rt SLS:   7 seconds  03/26/2023:  18 inch chair transfer: 2 tries required  02/16/2023 18 inch chair transfer: multiple tries required c noted deviation to Rt leg Lt SLS:  < 3 seconds Rt SLS: < 3 seconds  GAIT: 02/16/2023 Mod independent ambulation c SPC in Rt UE, decreased stance on Lt leg, decreased toe off progression noted.                                                                                                                                                                         TODAY'S TREATMENT                                                                             DATE: 04/16/2023 Therapeutic Exercise: Nustep  lvl 6 12 mins UE/LE Seated SLR with DF hold 2 x 10 performed  Seated LAQ with end range pauses each direction x 15, performed bilaterally  DF calf stretch in long sitting 30 sec x 3  Neuro Re-ed Tandem stance 1 min x 1 bilateral in // bars with occasional HHA BIG inspired weight shift stepping fwd and back x 10 each exercise, each leg.  In // bars with occasional HHA  Modalities: Vaso Lt knee/ankle combo,  medium compression 34 deg , supine w/ LE elevation  TODAY'S TREATMENT                                                                             DATE: 04/13/23 Pt seen for aquatic therapy today.  Treatment took place in water 3.5-4.75 ft in depth at the [facility] pool. Temp of water was 91.  Pt entered/exited the pool via stairs with hand rail.   Pt requires the buoyancy and hydrostatic pressure of water for support, and  to offload joints by unweighting joint load by at least 50 % in navel deep water and by at least 75-80% in chest to neck deep water.  Viscosity of the water is needed for resistance of strengthening. Water current perturbations provides challenge to standing balance requiring increased core activation. Aquatic PT exercises Sidestepping length of pool 3 round trips with mini squat Forward walking  3 round trips backward walking 3 round trips March walking 3 round trips with water dumbells Tandem walk 3 round trips with water dumbells Leg swings hip abd/add X 20 bilat with UE support with UE support Leg swings hip flexion/extension X 20 bilat Gastroc stretch 30 sec X 3 off of first step Step ups onto first step of pool X10 bilat with UE support Heel and toe raises 2X10 with UE support off of first step Squats 2X10 with UE support off of first step Hip circles CW and CCW X 15 each bilat 1/4 squat with shoulder extension X20 with kickboard 1/4 lunge with push/pull kickboard 2X15 Lunge step to pool wall, touch wall, then step back, X 15 bilat Monster walks 3 round trips Seated on pool chair and cycling 3 min  TODAY'S TREATMENT                                                                             DATE: 04/10/2023 Therapeutic Exercise: Long sitting green band inversion, eversion, DF 2 x 15 each direction  Long sitting blue band eccentric 3 x 10 PF Lt ankle.  Nustep Lvl 10 lvl 6 mins UE/LE Seated LAQ pause in end range each direction x 20  Additional  time required for rest, recovery and slow movement control focus on intervention.   Modalities: Vaso Lt knee/ankle combo, medium compression 34 deg , supine w/ LE elevation   TODAY'S TREATMENT                                                                             DATE: 04/02/2023 Therapeutic Exercise: Nustep Lvl 5 10  mins UE/LE Double leg 87 lbs x15, single leg  x 15 -performed bilaterally 31 lbs  Long sitting green band inversion, eversion, PF, DF x 20 Lt ankle Seated Lt hamstring curl green band 2 x 15   Neuro Re-ed Forward BIG inspired stepping for weight shift/balance improvements x 12 each side Reverse BIG inspired stepping for weight shift/balance improvements x 10 each side   Modalities: Vaso Lt knee/ankle combo, medium compression 34 deg , supine w/ LE elevation   PATIENT EDUCATION:  03/26/2023  Education details: HEP update Person educated: Patient Education method: Programmer, multimedia, Demonstration, Verbal cues, and Handouts Education comprehension: verbalized understanding, returned demonstration, and verbal cues required  HOME EXERCISE PROGRAM: Access Code: [email or web address] URL: [email or web address] Date: 03/26/2023 Prepared by: Chyrel Masson  Exercises - Supine Heel Slide (Mirrored)  - 3-5 x daily - 7 x weekly - 1-2 sets - 10 reps - 2  hold - Seated Long Arc Quad (Mirrored)  - 3-5 x daily - 7 x weekly - 1-2 sets - 10 reps - 2 hold - Seated Gastroc Stretch with Strap (Mirrored)  - 2-3 x daily - 7 x weekly - 1 sets - 3 reps - 30 hold - Seated Quad Set (Mirrored)  - 3-5 x daily - 7 x weekly - 1 sets - 10 reps - 5 hold - Seated Straight Leg Heel Taps  - 1-2 x daily - 7 x weekly - 2-3 sets - 10-15 reps - 2-3 hold - Long Sitting Eccentric Ankle Plantar Flexion with Resistance  - 1-2 x daily - 7 x weekly - 3 sets - 10 reps - Long Sitting Ankle Eversion with Resistance  - 1 x daily - 7 x weekly - 1-2 sets - 10-15 reps - Long Sitting Ankle Inversion with  Resistance  - 1 x daily - 7 x weekly - 1-2 sets - 10-15 reps - Seated Ankle Dorsiflexion with Resistance  - 1 x daily - 7 x weekly - 1-2 sets - 10-15 reps  ASSESSMENT:  CLINICAL IMPRESSION: Able to tolerate some standing intervention today with generally improving balance control compared to previous.   Continued edema throughout Lt leg with pain associated for knee and ankle which impacts functional movement tolerance.  Medical necessity for continued skilled PT services indicated to improve community integration.    OBJECTIVE IMPAIRMENTS: Abnormal gait, decreased activity tolerance, decreased balance, decreased coordination, decreased endurance, decreased mobility, difficulty walking, decreased ROM, decreased strength, hypomobility, increased edema, impaired perceived functional ability, impaired flexibility, improper body mechanics, postural dysfunction, and pain.   ACTIVITY LIMITATIONS: carrying, lifting, bending, sitting, standing, squatting, sleeping, stairs, transfers, bed mobility, bathing, toileting, dressing, and locomotion level  PARTICIPATION LIMITATIONS: meal prep, cleaning, laundry, interpersonal relationship, driving, shopping, and community activity  PERSONAL FACTORS:  History of 05/2022 lumbar surgery, GERD, COPD, HTN, history of arthritis, multiple treatment areas current  are also affecting patient's functional outcome.   REHAB POTENTIAL: Fair to good  CLINICAL DECISION MAKING: Evolving/moderate complexity  EVALUATION COMPLEXITY: Moderate   GOALS: Goals reviewed with patient? Yes  SHORT TERM GOALS: (target date for Short term goals are 3 weeks 03/09/2023)   1.  Patient will demonstrate independent use of home exercise program to maintain progress from in clinic treatments.  Goal status: Met   LONG TERM GOALS: (target dates for all long term goals are 10 weeks  04/27/2023 )   1. Patient will demonstrate/report pain at worst less than or equal to 2/10 to facilitate  minimal limitation in daily activity secondary to pain symptoms.  Goal status: on going 03/26/2023   2. Patient will demonstrate independent use of home exercise program to facilitate ability to maintain/progress functional gains from skilled physical therapy services.  Goal status: on going 03/26/2023   3. Patient will demonstrate FOTO outcome > or = 54 % to indicate reduced disability due to condition.  Goal status: on going 03/26/2023   4.  Patient will demonstrate Lt LE MMT 5/5 throughout to faciltiate usual transfers, stairs, squatting at [facility] for daily life.   Goal status: on going 03/26/2023   5.  Patient will demonstrate ability to ascend/descend stairs reciprocal gait pattern with single hand rail assist.  Goal status: on going 03/26/2023   6.  Patient will demonstrate Lt knee AROM 0-120 deg to facilitate ability to perform transfers, ambulation and other activity in daily life.  Goal status: on going 03/26/2023   7.  Patient will demonstrate ability to ambulation community distances > 500 ft.  Goal Status: on going 03/26/2023   PLAN:  PT FREQUENCY: 1-2x/week  PT DURATION: 10 weeks  PLANNED INTERVENTIONS: Therapeutic exercises, Therapeutic activity, Neuro Muscular re-education, Balance training, Gait training, Patient/Family education, Joint mobilization, Stair training, DME instructions, Dry Needling, Electrical stimulation, Traction, Cryotherapy, vasopneumatic deviceMoist heat, Taping, Ultrasound, Ionotophoresis 4mg /ml Dexamethasone, and aquatic therapy, Manual therapy.  All included unless contraindicated  PLAN FOR NEXT SESSION: Progressive strengthening/balance improvements as able.  Manage edema.     Chyrel Masson, PT, DPT, OCS, ATC 04/16/23  11:36 AM

## 2023-04-17 NOTE — Telephone Encounter (Signed)
Pt found coupon she is going to continue the [facility].

## 2023-04-17 NOTE — Progress Notes (Signed)
NEUROLOGY FOLLOW UP OFFICE NOTE  Frances Nelson [id number]  Subjective:  Frances Nelson is a 67 y.o. year old right-handed female with a medical history of chronic pain, Baker's cyst of left knee, HTN, HLD, vit D deficiency, COPD (smoker), OA, degenerative disease and spinal stenosis of cervical and lumbar spine, depression who we last saw on 10/18/22 for numbness and tingling in feet.  To briefly review: 08/17/22: Patient's symptoms started with left achilles tendonitis around 2018. She started getting numbness and tingling in both feet around 2019, like they were falling asleep. It has been constant. The symptoms have progressed over the years so that the numbness and tingling are up to the mid calf in the left leg and to lower leg on right. Symptoms have improved some on the right, but always been worse on the left. She endorses chronic back pain that predates symptoms in feet as well. She endorses occasional sciatica. She sometimes feels like the radiation is coming from the big toe up her leg, like someone is sticking her with a needle in her feet.   Sports medicine saw patient and felt symptoms were related to lumbar stenosis, so she was referred to NSGY who did L4-5 open decompression and PSIF on 05/18/22. Patient continues to have pain in back and hips. She is not really having shooting pain going down. The numbness and tingling in her legs has not significantly changed. It wakes her up at night. She endorses leg cramps. She denies bowel or bladder changes and denies saddle anesthesia.   Patient is currently taking Cymbalta 60 mg qhs, which she is unsure if it helps. She has been on it for over a year. She has previously tried gabapentin, but stopped this as she did not think it was helping. She has tried Lyrica but had a bad reaction (cannot remember what the reaction was). She has tried nortriptyline in the past as well. She has tried diclofenac cream, lidocaine cream, biofreeze, and bengay  type of medications. She has had a little success with CBD cream. She also takes Tylenol arthritis.   Patient was referred to PT. She has not yet scheduled this (as of 08/17/22).   Patient also has a lot of issues with her left knee including a Baker's cyst and torn meniscus. She sees sports medicine for this. She is using a cane to walk due to this pain.   Patient has previously seen neurology in the past at [facility]. Patient had a 4 limb EMG done. She was told she had carpal tunnel syndrome, which was odd to her as she did not have symptoms in her hands. She was not sure why they even checked her arms. She does mention that since her back surgery in 05/2022, she has noticed some cramping/locking up of fingers.   Patient saw Dr. Corliss Skains in rheumatology on 03/22/22. Per clinic note: Positive ANA (antinuclear antibody) - 10/03/21: ANA 1:40NH, RF<14, ESR 20, Uric acid 7.5, anti-CCP<16, CRP 1.2 -there is no history of oral ulcers, nasal ulcers, malar rash, photosensitivity, Raynaud's phenomenon or lymphadenopathy.  She has dry mouth and dry eyes symptoms most likely related to smoking.  Over-the-counter products were discussed.  I will obtain following labs today.  Plan: Protein / creatinine ratio, urine, ANA, Anti-DNA antibody, double-stranded, C3 and C4, Sjogrens syndrome-A extractable nuclear antibody, Sjogrens syndrome-B extractable nuclear antibody, Anti-Smith antibody, RNP Antibody    She report any constitutional symptoms like fever, night sweats, anorexia or unintentional weight loss.  EtOH use: None  Restrictive diet? No Family history of neuropathy/myopathy/neurologic disease? Not definitively, but 2 sisters has problems with left leg  10/18/22: Labs were significant for a borderline low B12 (300). I recommended supplementation with 1000 mcg daily. Patient is taking this.   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    Overall, patient's symptoms have not significantly changed. She continues to have numbness and tingling in legs and back pain with shooting pains. She mentions she is now getting a vibration sensation in her right arm after using her electric toothbrush (like it is still in her hand). It is constant, but a different sensation from the numbness and tingling in her legs. This gets worse is she is using her hands. She is not currently using wrist splints for CTS.   Patient is taking Cymbalta 90 mg at night. This helped her sleep better. The split dose did not help.   Patient has not heard from physical therapy.   Of note, patient was started on Topamax for migraines, which seems to be helping per patient.   Patient is seeing sports medicine for a torn meniscus in the left knee. Surgery has been recommended, but patient has not seen a surgeon.   Most recent Assessment and Plan (10/18/22): This is Frances Nelson, a 67 y.o. female with numbness and tingling in the legs and arms. 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 Her symptoms are likely multifactorial with contributions from the above and left knee and back pain. She has been recommended to get left knee surgery, and is hopefully going to be getting this soon per patient. Unfortunately, patient has failed many prior neuropathic medications. We discussed EMG of RUE, but patient preferred to try wrist splints for CTS first.   Plan: -Continue Cymbalta 90 mg at bedtime -Will check on PT referral. Patient will talk to sports medicine about whether PT is a good idea currently with that knee. -Recommended bilateral wrist splints for carpal tunnel  Since their last visit: Patient feels her legs (numbness and tingling) is about the same. She has swelling in left > right leg. She is not sure why her legs are swollen, but feels  this is contributing to her pain. She has cut down her Cymbalta to 60 mg at bedtime. She did not think 90 mg was any better so cut it back.  Patient is doing physical therapy and water therapy. She thinks the water therapy in particular helps.  In terms of her hands, she did wear braces for a while, but it made her too sweaty, so she stopped. She thinks her symptoms may have improved some. She is having a lot of arthritis, particularly in her left arm. She is planning to talk to ortho about this.  She has not had any recent falls.  MEDICATIONS:  Outpatient Encounter Medications as of 04/25/2023  Medication Sig   acetaminophen (TYLENOL) 650 MG CR tablet Take 1,300 mg by mouth as needed.   acidophilus (RISAQUAD) CAPS capsule Take 1 capsule by mouth in the morning.   Adapalene (DIFFERIN) 0.3 % gel Apply 1 Application topically at bedtime. Apply a pea sized amount to face and behind ears 3 nights weekly   B Complex Vitamins (VITAMIN-B COMPLEX PO) Take by mouth daily.   Calcium-Vitamins C & D (  CALCIUM/C/D PO) Take 1 tablet by mouth in the morning.   cetirizine (ZYRTEC) 10 MG tablet Take 10 mg by mouth in the morning.   Cholecalciferol (HM VITAMIN D3 PO) Take 5,000 Units by mouth in the morning.   clindamycin (CLEOCIN T) 1 % SWAB Apply 1 Application topically in the morning. Wipe on face and behind ears every morning   clobetasol cream (TEMOVATE) 0.05 % Apply 1 Application topically 2 (two) times daily. Apply to affected areas 2 x daily for 2 weeks then stop. Use as needed (Patient taking differently: Apply 1 Application topically as needed. Apply to affected areas 2 x daily for 2 weeks then stop. Use as needed)   DULoxetine (CYMBALTA) 60 MG capsule Take 60 mg by mouth daily.   ezetimibe (ZETIA) 10 MG tablet Take 1 tablet (10 mg total) by mouth daily.   hydrochlorothiazide (HYDRODIURIL) 12.5 MG tablet TAKE 1 TABLET BY MOUTH EVERY DAY (Patient taking differently: Take 12.5 mg by mouth as needed.)    losartan (COZAAR) 25 MG tablet TAKE 1 TABLET (25 MG TOTAL) BY MOUTH DAILY.   MELATONIN PO Take 5 mg by mouth at bedtime.   meloxicam (MOBIC) 15 MG tablet Take 0.5 tablets (7.5 mg total) by mouth daily. (Patient taking differently: Take 7.5 mg by mouth as needed.)   methocarbamol (ROBAXIN) 750 MG tablet TAKE 1 TABLET (750 MG TOTAL) BY MOUTH EVERY 6 (SIX) HOURS AS NEEDED FOR MUSCLE SPASMS (NOT COVERED)   mometasone (NASONEX) 50 MCG/ACT nasal spray Place 2 sprays into the nose every evening.   montelukast (SINGULAIR) 10 MG tablet TAKE 1 TABLET BY MOUTH EVERYDAY AT BEDTIME   Multiple Vitamins-Minerals (MULTIVITAMIN WITH MINERALS) tablet Take 1 tablet by mouth in the morning.   omeprazole (PRILOSEC) 40 MG capsule TAKE 1 CAPSULE (40 MG TOTAL) BY MOUTH IN THE MORNING AND AT BEDTIME.   traMADol (ULTRAM) 50 MG tablet Take 1-2 tablets (50-100 mg total) by mouth every 6 (six) hours as needed.   COLLAGEN PO Take by mouth. Collagen Peptides (Patient not taking: Reported on 04/25/2023)   topiramate (TOPAMAX) 50 MG tablet Take 1 tablet (50 mg total) by mouth 2 (two) times daily. (Patient not taking: Reported on 04/25/2023)   Facility-Administered Encounter Medications as of 04/25/2023  Medication   triamcinolone acetonide (KENALOG) 10 MG/ML injection 10 mg    PAST MEDICAL HISTORY: Past Medical History:  Diagnosis Date   Arthritis    Asthma    COPD (chronic obstructive pulmonary disease) (HCC)    GERD (gastroesophageal reflux disease)    Hypertension    Pneumonia     PAST SURGICAL HISTORY: Past Surgical History:  Procedure Laterality Date   ABDOMINAL HYSTERECTOMY     APPENDECTOMY     HERNIA REPAIR     Umbilical x 3   KNEE ARTHROSCOPY W/ MENISCAL REPAIR Left 12/2022   TONSILLECTOMY     TRANSFORAMINAL LUMBAR INTERBODY FUSION (TLIF) WITH PEDICLE SCREW FIXATION 1 LEVEL N/A 05/18/2022   Procedure: Lumbar Four-Five Open Laminectomy/Transforaminal Lumbar Interbody Fusion/Posterolateral fusion;  Surgeon:  Jadene Pierini, MD;  Location: [facility] OR;  Service: Neurosurgery;  Laterality: N/A;    ALLERGIES: Allergies  Allergen Reactions   Symbicort [Budesonide-Formoterol Fumarate] Other (See Comments)    Patient reported dizziness, nausea, headaches and sore throat while using Symbicort 160. Reported on 09/03/17   Tramadol Hives   Celebrex [Celecoxib]     Bleeding.     Chocolate Flavor    Ciprofloxacin Nausea And Vomiting    Headache, shaking.  Egg-Derived Products    Flavoring Agent     Unknown   Lisinopril Cough    Pt off med for a week, some improvement   Lyrica [Pregabalin]     "Made me out of it."    Penicillins     Patient preference   Wellbutrin [Bupropion]    Codeine Palpitations    FAMILY HISTORY: Family History  Problem Relation Age of Onset   Angina Father    Parkinson's disease Father    Leukemia Sister    Kidney disease Sister    Kidney disease Brother    Brain cancer Brother        glioblastoma    Cancer Brother    Allergies Son    Asthma Son    Allergies Son     SOCIAL HISTORY: Social History   Tobacco Use   Smoking status: Every Day    Current packs/day: 1.00    Average packs/day: 1 pack/day for 49.0 years (49.0 ttl pk-yrs)    Types: Cigarettes    Passive exposure: Never   Smokeless tobacco: Never   Tobacco comments:    1 pack a day  Vaping Use   Vaping status: Never Used  Substance Use Topics   Alcohol use: No   Drug use: Never   Social History   Social History Narrative   Right Handed    Lives in a two story home - Lives with son       Are you currently employed ?    What is your current occupation?retired   Do you live at home alone?   Who lives with you?    What type of home do you live in: 1 story or 2 story?     Caffeine 1-2 cups a day      Objective:  Vital Signs:  BP 129/83   Pulse 88   Ht 5\' 4"  (1.626 m)   Wt 212 lb (96.2 kg)   SpO2 95%   BMI 36.39 kg/m   General: No acute distress.  Patient appears  well-groomed.   Head:  Normocephalic/atraumatic Neck: supple Lungs: Non-labored breathing on room air   Neurological Exam: Mental status: alert and oriented, speech fluent and not dysarthric, language intact.  Cranial nerves: CN I: not tested CN II: pupils equal, round and reactive to light, visual fields intact CN III, IV, VI:  full range of motion, no nystagmus, no ptosis CN V: facial sensation intact. CN VII: upper and lower face symmetric CN VIII: hearing intact CN IX, X: uvula midline CN XI: sternocleidomastoid and trapezius muscles intact CN XII: tongue midline  Bulk & Tone: normal, no fasciculations. Motor:  muscle strength 5/5 throughout though with giveway weakness, particularly in LUE and LLE Deep Tendon Reflexes:  2+ throughout.   Sensation:  Pinprick sensation intact in upper extremities but absent in lower extremities to just below the knee on the right and just above the knee on the left. Finger to nose testing:  Without dysmetria.   Gait:  Antalgic gait.  Labs and Imaging review: New results: 04/09/23: HbA1c: 6.1 Lipid panel: total cholesterol 228, TG 231, LDL 132  04/05/23: RNP ab: positive ANA negative dsDNA negative C3 and C4 wnl  Previously reviewed results: 08/17/22: IFE: no M protein B12: 300 B1: 10   B12 (10/05/22): 846 HbA1c (10/05/22): 6.0 TSH (10/05/22): 1.83  BMP and CBC (05/11/22): unremarkable HbA1c (04/06/22): 5.5 (6.2 is highest in the past) ANA (03/22/22): positive at 1:40 RNP ab (03/22/22): 1.7 (positive)  Normal or unremarkable (03/22/22): anti-Sm, SSA, SSB, C3, C4, dsDNA, CCP (10/03/21)   EMG (09/05/22): NCV & EMG Findings: Extensive electrodiagnostic evaluation of the left upper and lower limb with additional nerve conduction studies of the right lower limb shows: Bilateral sural and superficial peroneal/fibular sensory responses are absent. Left median sensory response shows prolonged distal peak latency (4.3 ms). Left ulnar sensory response  is within normal limits. Left tibial (AH) motor response shows prolonged distal onset latency (6.4 ms) and reduced amplitude (1.61 mV). Left peroneal/fibular (EDB), left median (APB), and left ulnar (ADM) motor responses are within normal limits.  Left H reflex latency is within normal limits. Chronic motor axon loss changes WITH active denervation changes are seen in the left extensor digitorum brevis and abductor hallucis muscles. Chronic motor axon loss changes WITHOUT active denervation changes are seen in the left tibialis anterior, flexor digitorum longus, and gluteus medius muscles.   Impression: This is an abnormal study. 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 Evidence of a left median mononeuropathy at or distal to the wrist, consistent with carpal tunnel syndrome, mild in degree electrically.   Left knee MRI (07/04/22): IMPRESSION: 1. Complex degenerative tear and diminution of the posterior horn of the medial meniscus. 2. Mild osteoarthritis of the medial tibiofemoral compartment.   Lumbar xray (05/18/22): FINDINGS: Again noted are a slight levorotary lumbar scoliosis apex L2, minimal grade 1 L4-5 spondylolisthesis unchanged as well.   Bilateral posterior fusion rods and pedicle screws, laminectomy defect and interbody metallic disc space hardware are again noted at L4-5.   No acute hardware complication or hardware failure are seen.   Osteopenia. There is preservation of the normal lumbar vertebral heights without evidence of fractures.   The lumbar discs are maintained in heights. There is spondylosis. Mild facet hypertrophy at L2-3 inferiorly.   The SI joints are unremarkable, as visualized.   The aorta and  common iliac arteries are heavily calcified.   IMPRESSION: 1. No evidence of fractures. 2. Osteopenia and degenerative change. 3. Slight levorotary lumbar scoliosis apex L2, minimal grade 1 L4-5 spondylolisthesis unchanged. 4. L4-5 dorsal fusion construct with interbody hardware and laminectomy. 5. Aortic atherosclerosis.   MRI lumbar spine (10/25/21): FINDINGS: Segmentation:  5 lumbar vertebra.   Alignment: Mild retrolisthesis L2-3 and L3-4. 4 mm anterolisthesis L4-5   Vertebrae:  Normal bone marrow.  Negative for fracture or mass   Conus medullaris and cauda equina: Conus extends to the T12-L1 level. Conus and cauda equina appear normal.   Paraspinal and other soft tissues: Negative for paraspinous mass or adenopathy   Disc levels:   L1-2: Negative   L2-3: Mild disc degeneration and disc bulging. No significant spinal or foraminal stenosis   L3-4: Mild disc and mild facet degeneration. Negative for disc protrusion or stenosis.   L4-5: 4 mm anterolisthesis. Diffuse disc bulging and moderate to advanced facet degeneration. Moderate spinal stenosis. Moderate subarticular stenosis bilaterally. Right foraminal encroachment with right L4 and L5 nerve root impingement.   L5-S1: Negative   IMPRESSION: 4 mm anterolisthesis L4-5. Moderate spinal stenosis. Moderate subarticular and foraminal stenosis on the right. Moderate left subarticular stenosis.   Cervical spine xray (04/09/20): FINDINGS: There is no evidence of cervical spine fracture or prevertebral soft tissue swelling. Alignment is  normal. No other significant bone abnormalities are identified.   IMPRESSION: Negative cervical spine radiographs.   EMG (06/03/20 by Dr. Wynn Banker in [facility]): Normal study of LLE - no neuropathy or radiculopathy  Assessment/Plan:  This is Frances Nelson, a 67 y.o. female with: Numbness, tingling, and pain in bilateral lower extremities. 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 This combined with know orthopedic issues (left knee particularly), OA, lumbar stenosis, and leg swelling are all likely contributing to chronic pain. Unfortunately, patient has had difficulty tolerating or had poor efficacy of many past medications including gabapentin, Lyrica, nortriptlyine, lidocaine cream, diclofenac cream, CBC cream. Cymbalta has worked some. She also gets improvement with water therapy. Numbness and tingling in hands - carpal tunnel syndrome and OA. Improved some with splinting but was not able to tolerate. These symptoms are not as bothersome today. Migraines - stable on Topamax, managed by PCP  Plan: -Continue Cymbalta 60 mg daily -Talk to PCP about leg swelling -Continue to see ortho as planned -Will try pain management again (?[facility]) -Continue PT and water therapy -Fall precautions discussed  Return to clinic in 6 months  Total time spent reviewing records, interview, history/exam, documentation, and coordination of care on day of encounter:  40 min  Jacquelyne Balint, MD

## 2023-04-19 ENCOUNTER — Ambulatory Visit: Payer: Medicare Other | Admitting: Rheumatology

## 2023-04-20 ENCOUNTER — Ambulatory Visit: Payer: Medicare Other | Admitting: Physical Therapy

## 2023-04-24 ENCOUNTER — Encounter: Payer: Self-pay | Admitting: Physical Therapy

## 2023-04-24 ENCOUNTER — Ambulatory Visit (INDEPENDENT_AMBULATORY_CARE_PROVIDER_SITE_OTHER): Payer: Medicare Other | Admitting: Physical Therapy

## 2023-04-24 DIAGNOSIS — G8929 Other chronic pain: Secondary | ICD-10-CM

## 2023-04-24 DIAGNOSIS — M25562 Pain in left knee: Secondary | ICD-10-CM

## 2023-04-24 DIAGNOSIS — R600 Localized edema: Secondary | ICD-10-CM | POA: Diagnosis not present

## 2023-04-24 DIAGNOSIS — M79662 Pain in left lower leg: Secondary | ICD-10-CM

## 2023-04-24 DIAGNOSIS — R262 Difficulty in walking, not elsewhere classified: Secondary | ICD-10-CM

## 2023-04-24 DIAGNOSIS — M6281 Muscle weakness (generalized): Secondary | ICD-10-CM

## 2023-04-24 NOTE — Therapy (Signed)
OUTPATIENT PHYSICAL THERAPY TREATMENT   Patient Name: MYSTI MANE MRN: [id number] DOB:1956-01-13, 67 y.o., female Today's Date: 04/24/2023    END OF SESSION:  PT End of Session - 04/24/23 1104     Visit Number 17    Number of Visits 20    Date for PT Re-Evaluation 04/27/23    Authorization Type MCR    Progress Note Due on Visit 20    PT Start Time 1104    PT Stop Time 1148    PT Time Calculation (min) 44 min    Activity Tolerance Patient limited by pain    Behavior During Therapy WFL for tasks assessed/performed              Past Medical History:  Diagnosis Date   Arthritis    Asthma    COPD (chronic obstructive pulmonary disease) (HCC)    GERD (gastroesophageal reflux disease)    Hypertension    Pneumonia    Past Surgical History:  Procedure Laterality Date   ABDOMINAL HYSTERECTOMY     APPENDECTOMY     HERNIA REPAIR     Umbilical x 3   KNEE ARTHROSCOPY W/ MENISCAL REPAIR Left 12/2022   TONSILLECTOMY     TRANSFORAMINAL LUMBAR INTERBODY FUSION (TLIF) WITH PEDICLE SCREW FIXATION 1 LEVEL N/A 05/18/2022   Procedure: Lumbar Four-Five Open Laminectomy/Transforaminal Lumbar Interbody Fusion/Posterolateral fusion;  Surgeon: Jadene Pierini, MD;  Location: [facility] OR;  Service: Neurosurgery;  Laterality: N/A;   Patient Active Problem List   Diagnosis Date Noted   Microhematuria 04/09/2023   Bilateral shoulder pain 04/09/2023   Urethritis 10/05/2022   External otitis of left ear 10/05/2022   Spondylolisthesis of lumbar region 05/18/2022   Smoker 04/08/2022   Deviated nasal septum 04/08/2022   Bilateral pain of leg and foot 04/08/2022   Follicular acne 04/06/2022   Baker cyst, left 04/06/2022   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   Depression 10/04/2021   Left knee pain 10/04/2021   Chronic pain 04/09/2021   Pelvic pain 04/09/2021   HLD (hyperlipidemia) 04/09/2021   Vitamin D deficiency 04/09/2021   Estrogen deficiency  04/09/2021   Hyperglycemia 04/01/2021   Chronic cough 12/30/2020   GERD (gastroesophageal reflux disease) 11/06/2019   Bilateral leg edema 10/16/2019   Pleuritic chest pain 10/16/2019   Hypertension 10/02/2019   Arthralgia 06/28/2018   Sinusitis 06/28/2018   Renal cyst 05/03/2018   Syncope 05/03/2018   Stress and adjustment reaction 05/03/2018   History of tobacco use 05/02/2018   Primary osteoarthritis of both hands 04/10/2018   Primary osteoarthritis of both knees 04/10/2018   Primary osteoarthritis of both feet 04/10/2018   Achilles tendinitis 04/02/2018   Screening for breast cancer 03/15/2018   Positive ANA (antinuclear antibody) 03/15/2018   Screening for colon cancer 03/15/2018   Rash 03/15/2018   Achilles tendon pain 11/13/2017   Cervical myelopathy with cervical radiculopathy (HCC) 11/13/2017   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   Allergic rhinitis 06/19/2017   Edema 12/24/2015   Carpal tunnel syndrome, right 12/16/2015   Hypercalcemia 11/11/2015   Pain in both upper extremities 11/11/2015   Drug reaction 10/28/2015   Muscle spasms of both lower extremities 10/28/2015   Nerve pain 10/28/2015   Chronic asthmatic bronchitis with acute exacerbation (HCC) 09/06/2015   Environmental and seasonal allergies 07/17/2014    PCP: Corwin Levins MD  REFERRING PROVIDER: Kathryne Hitch, MD  REFERRING DIAG: [phone] (ICD-10-CM) - Status post arthroscopic surgery of left knee  M76.60 (ICD-10-CM) - Achilles tendon pain  THERAPY DIAG:  Chronic pain of left knee  Pain in left lower leg  Muscle weakness (generalized)  Localized edema  Difficulty in walking, not elsewhere classified  Rationale for Evaluation and Treatment: Rehabilitation  ONSET DATE: Surgery 12/21/2022 on Lt knee  SUBJECTIVE:   SUBJECTIVE STATEMENT: Pt states she is having a rough week overall emotionally and also with pain.  PERTINENT HISTORY: History of 05/2022 lumbar surgery, GERD, COPD,  HTN, history of arthritis  PAIN:  NPRS scale: Lt ankle:  6/10, Lt knee 6/10 Pain location: Lt knee, Lt achilles Pain description: constant, throbbing,  Aggravating factors: WB activity Relieving factors: nothing specific   PRECAUTIONS: None  WEIGHT BEARING RESTRICTIONS: No  FALLS:  Has patient fallen in last 6 months? 1 fall without medical attention.   LIVING ENVIRONMENT: Lives in: House/apartment Stairs: multistory with bedroom upstairs but has moved bed downstairs.   Rail on Lt side going up.   OCCUPATION: Retired   PLOF: Independent, walking for exercise (5-6 miles/day in the past until 2012).  Dog walking   PATIENT GOALS: Reduce pain, move better, walk better without cane.   OBJECTIVE: (objective measures completed at initial evaluation unless otherwise dated)   PATIENT SURVEYS:  03/26/2023:  FOTO update:  44  02/16/2023 FOTO intake:  38  predicted:  54  COGNITION: 02/16/2023 Overall cognitive status: WFL    SENSATION: 02/16/2023 No specific check today  EDEMA:  02/16/2023 Lt leg edema noted from knee down compared to Lt.   MUSCLE LENGTH: 02/16/2023 No specific testing  POSTURE:  02/16/2023 Reduced lumbar lordosis in standing, Weight shift off Lt leg to Rt.   PALPATION: 02/16/2023 Tenderness around swollen Lt ankle, Lt knee to light touch.   LOWER EXTREMITY ROM:   ROM Right 02/16/2023 Left 02/16/2023 Left 02/21/2023 Left 03/02/2023 Left 03/26/2023  Hip flexion       Hip extension       Hip abduction       Hip adduction       Hip internal rotation       Hip external rotation       Knee flexion 125 92 AROM in supine heel slide c pain PROM 95/100 grossly assesed in sitting.  90 AROM in supine heel slide c pain 96 AROM in supine heel slide c pain  Knee extension 0 0 AROM in quad set supine  0 AROM in seated quad set   Ankle dorsiflexion       Ankle plantarflexion       Ankle inversion       Ankle eversion        (Blank rows = not tested)  LOWER EXTREMITY  MMT:  MMT Right 02/16/2023 Left 02/16/2023 Left 03/13/2023 Right 03/26/2023 Left 03/26/2023 Left 04/10/2023  Hip flexion 5/5 4/5      Hip extension        Hip abduction        Hip adduction        Hip internal rotation        Hip external rotation        Knee flexion 5/5 4/5 4/5  4+/5   Knee extension 4/5 3+/5 4-/5 5/5 52.9, 52 lbs  4-/5  15.9, 16.5 lbs with pain   Ankle dorsiflexion 5/5 3+/5 c pain 4/5  4+/5 5/5  Ankle plantarflexion  2+/5 c pain (unable to perform standing PF) 2+/5 c pain (unable to perform standing PF)  2+/5   Ankle inversion 5/5  3+/5 c pain 4/5 c pain  4+/5   Ankle eversion 5/5 3+/5 c pain 3+/5 c pain  4/5    (Blank rows = not tested)  LOWER EXTREMITY SPECIAL TESTS:  02/16/2023 No specific testing today.   FUNCTIONAL TESTS:  04/16/2023:   Lt SLS: 4 seconds Rt SLS:   7 seconds  03/26/2023:  18 inch chair transfer: 2 tries required  02/16/2023 18 inch chair transfer: multiple tries required c noted deviation to Rt leg Lt SLS:  < 3 seconds Rt SLS: < 3 seconds  GAIT: 02/16/2023 Mod independent ambulation c SPC in Rt UE, decreased stance on Lt leg, decreased toe off progression noted.                                                                                                                                                                         TODAY'S TREATMENT                                                                             DATE:  04/24/2023 Therapeutic Exercise: Nustep  lvl 6 12 mins UE/LE Slantboard gastroc stretch 30 sec X 3 Standing heel and toe raises 2X10 Seated SLR with DF hold 2 x 10 performed  Seated LAQ with end range pauses each direction 2 x 15, performed bilaterally    Neuro Re-ed Tandem walking down forward and back backwards 5 trips in bars with intermittent UE support PRN SLS with Stepping over 6 inch hurdle front to back X 10 each leg  Modalities: Vaso Lt knee/ankle combo, medium compression 34 deg , supine w/ LE  elevation 04/16/2023 Therapeutic Exercise: Nustep  lvl 6 12 mins UE/LE Seated SLR with DF hold 2 x 10 performed  Seated LAQ with end range pauses each direction x 15, performed bilaterally  DF calf stretch in long sitting 30 sec x 3  Neuro Re-ed Tandem stance 1 min x 1 bilateral in // bars with occasional HHA BIG inspired weight shift stepping fwd and back x 10 each exercise, each leg.  In // bars with occasional HHA  Modalities: Vaso Lt knee/ankle combo, medium compression 34 deg , supine w/ LE elevation  TODAY'S TREATMENT  DATE: 04/13/23 Pt seen for aquatic therapy today.  Treatment took place in water 3.5-4.75 ft in depth at the [facility] pool. Temp of water was 91.  Pt entered/exited the pool via stairs with hand rail.   Pt requires the buoyancy and hydrostatic pressure of water for support, and to offload joints by unweighting joint load by at least 50 % in navel deep water and by at least 75-80% in chest to neck deep water.  Viscosity of the water is needed for resistance of strengthening. Water current perturbations provides challenge to standing balance requiring increased core activation. Aquatic PT exercises Sidestepping length of pool 3 round trips with mini squat Forward walking  3 round trips backward walking 3 round trips March walking 3 round trips with water dumbells Tandem walk 3 round trips with water dumbells Leg swings hip abd/add X 20 bilat with UE support with UE support Leg swings hip flexion/extension X 20 bilat Gastroc stretch 30 sec X 3 off of first step Step ups onto first step of pool X10 bilat with UE support Heel and toe raises 2X10 with UE support off of first step Squats 2X10 with UE support off of first step Hip circles CW and CCW X 15 each bilat 1/4 squat with shoulder extension X20 with kickboard 1/4 lunge with push/pull kickboard 2X15 Lunge step to pool wall,  touch wall, then step back, X 15 bilat Monster walks 3 round trips Seated on pool chair and cycling 3 min  TODAY'S TREATMENT                                                                             DATE: 04/10/2023 Therapeutic Exercise: Long sitting green band inversion, eversion, DF 2 x 15 each direction  Long sitting blue band eccentric 3 x 10 PF Lt ankle.  Nustep Lvl 10 lvl 6 mins UE/LE Seated LAQ pause in end range each direction x 20  Additional time required for rest, recovery and slow movement control focus on intervention.   Modalities: Vaso Lt knee/ankle combo, medium compression 34 deg , supine w/ LE elevation   TODAY'S TREATMENT                                                                             DATE: 04/02/2023 Therapeutic Exercise: Nustep Lvl 5 10  mins UE/LE Double leg 87 lbs x15, single leg  x 15 -performed bilaterally 31 lbs  Long sitting green band inversion, eversion, PF, DF x 20 Lt ankle Seated Lt hamstring curl green band 2 x 15   Neuro Re-ed Forward BIG inspired stepping for weight shift/balance improvements x 12 each side Reverse BIG inspired stepping for weight shift/balance improvements x 10 each side   Modalities: Vaso Lt knee/ankle combo, medium compression 34 deg , supine w/ LE elevation   PATIENT EDUCATION:  03/26/2023  Education details: HEP update Person educated: Patient Education method: Programmer, multimedia, Facilities manager, Verbal  cues, and Handouts Education comprehension: verbalized understanding, returned demonstration, and verbal cues required  HOME EXERCISE PROGRAM: Access Code: [email or web address] URL: [email or web address] Date: 03/26/2023 Prepared by: Chyrel Masson  Exercises - Supine Heel Slide (Mirrored)  - 3-5 x daily - 7 x weekly - 1-2 sets - 10 reps - 2 hold - Seated Long Arc Quad (Mirrored)  - 3-5 x daily - 7 x weekly - 1-2 sets - 10 reps - 2 hold - Seated Gastroc Stretch with Strap (Mirrored)  - 2-3 x daily - 7  x weekly - 1 sets - 3 reps - 30 hold - Seated Quad Set (Mirrored)  - 3-5 x daily - 7 x weekly - 1 sets - 10 reps - 5 hold - Seated Straight Leg Heel Taps  - 1-2 x daily - 7 x weekly - 2-3 sets - 10-15 reps - 2-3 hold - Long Sitting Eccentric Ankle Plantar Flexion with Resistance  - 1-2 x daily - 7 x weekly - 3 sets - 10 reps - Long Sitting Ankle Eversion with Resistance  - 1 x daily - 7 x weekly - 1-2 sets - 10-15 reps - Long Sitting Ankle Inversion with Resistance  - 1 x daily - 7 x weekly - 1-2 sets - 10-15 reps - Seated Ankle Dorsiflexion with Resistance  - 1 x daily - 7 x weekly - 1-2 sets - 10-15 reps  ASSESSMENT:  CLINICAL IMPRESSION: Still with pain but did show good tolerance to standing activity today for strength and ROM of lower extremities. She will meet with MD soon about knee MRI results.   OBJECTIVE IMPAIRMENTS: Abnormal gait, decreased activity tolerance, decreased balance, decreased coordination, decreased endurance, decreased mobility, difficulty walking, decreased ROM, decreased strength, hypomobility, increased edema, impaired perceived functional ability, impaired flexibility, improper body mechanics, postural dysfunction, and pain.   ACTIVITY LIMITATIONS: carrying, lifting, bending, sitting, standing, squatting, sleeping, stairs, transfers, bed mobility, bathing, toileting, dressing, and locomotion level  PARTICIPATION LIMITATIONS: meal prep, cleaning, laundry, interpersonal relationship, driving, shopping, and community activity  PERSONAL FACTORS:  History of 05/2022 lumbar surgery, GERD, COPD, HTN, history of arthritis, multiple treatment areas current  are also affecting patient's functional outcome.   REHAB POTENTIAL: Fair to good  CLINICAL DECISION MAKING: Evolving/moderate complexity  EVALUATION COMPLEXITY: Moderate   GOALS: Goals reviewed with patient? Yes  SHORT TERM GOALS: (target date for Short term goals are 3 weeks 03/09/2023)   1.  Patient will  demonstrate independent use of home exercise program to maintain progress from in clinic treatments.  Goal status: Met   LONG TERM GOALS: (target dates for all long term goals are 10 weeks  04/27/2023 )   1. Patient will demonstrate/report pain at worst less than or equal to 2/10 to facilitate minimal limitation in daily activity secondary to pain symptoms.  Goal status: on going 03/26/2023   2. Patient will demonstrate independent use of home exercise program to facilitate ability to maintain/progress functional gains from skilled physical therapy services.  Goal status: on going 03/26/2023   3. Patient will demonstrate FOTO outcome > or = 54 % to indicate reduced disability due to condition.  Goal status: on going 03/26/2023   4.  Patient will demonstrate Lt LE MMT 5/5 throughout to faciltiate usual transfers, stairs, squatting at [facility] for daily life.   Goal status: on going 03/26/2023   5.  Patient will demonstrate ability to ascend/descend stairs reciprocal gait pattern with single hand rail assist.  Goal status: on going 03/26/2023  6.  Patient will demonstrate Lt knee AROM 0-120 deg to facilitate ability to perform transfers, ambulation and other activity in daily life.  Goal status: on going 03/26/2023   7.  Patient will demonstrate ability to ambulation community distances > 500 ft.  Goal Status: on going 03/26/2023   PLAN:  PT FREQUENCY: 1-2x/week  PT DURATION: 10 weeks  PLANNED INTERVENTIONS: Therapeutic exercises, Therapeutic activity, Neuro Muscular re-education, Balance training, Gait training, Patient/Family education, Joint mobilization, Stair training, DME instructions, Dry Needling, Electrical stimulation, Traction, Cryotherapy, vasopneumatic deviceMoist heat, Taping, Ultrasound, Ionotophoresis 4mg /ml Dexamethasone, and aquatic therapy, Manual therapy.  All included unless contraindicated  PLAN FOR NEXT SESSION: Progressive strengthening/balance improvements as able.   Manage edema.    Ivery Quale, PT, DPT 04/24/23 11:51 AM

## 2023-04-25 ENCOUNTER — Ambulatory Visit (INDEPENDENT_AMBULATORY_CARE_PROVIDER_SITE_OTHER): Payer: Medicare Other | Admitting: Neurology

## 2023-04-25 ENCOUNTER — Encounter: Payer: Self-pay | Admitting: Neurology

## 2023-04-25 VITALS — BP 129/83 | HR 88 | Ht 64.0 in | Wt 212.0 lb

## 2023-04-25 DIAGNOSIS — G629 Polyneuropathy, unspecified: Secondary | ICD-10-CM | POA: Diagnosis not present

## 2023-04-25 DIAGNOSIS — M25551 Pain in right hip: Secondary | ICD-10-CM

## 2023-04-25 DIAGNOSIS — M25552 Pain in left hip: Secondary | ICD-10-CM

## 2023-04-25 DIAGNOSIS — M5442 Lumbago with sciatica, left side: Secondary | ICD-10-CM

## 2023-04-25 DIAGNOSIS — R202 Paresthesia of skin: Secondary | ICD-10-CM

## 2023-04-25 DIAGNOSIS — R600 Localized edema: Secondary | ICD-10-CM

## 2023-04-25 DIAGNOSIS — M5416 Radiculopathy, lumbar region: Secondary | ICD-10-CM

## 2023-04-25 DIAGNOSIS — G8929 Other chronic pain: Secondary | ICD-10-CM

## 2023-04-25 DIAGNOSIS — M25562 Pain in left knee: Secondary | ICD-10-CM

## 2023-04-25 DIAGNOSIS — R200 Anesthesia of skin: Secondary | ICD-10-CM

## 2023-04-25 DIAGNOSIS — M5441 Lumbago with sciatica, right side: Secondary | ICD-10-CM

## 2023-04-25 NOTE — Patient Instructions (Signed)
-Continue Cymbalta 60 mg daily -Talk to primary care doctor about leg swelling -Continue to see ortho as planned -Will try pain management again (I will try to send to [facility]) -Continue PT and water therapy  Return to clinic in 6 months  Please let me know if you have any questions or concerns in the meantime.   The physicians and staff at [facility] are committed to providing excellent care. You may receive a survey requesting feedback about your experience at our office. We strive to receive "very good" responses to the survey questions. If you feel that your experience would prevent you from giving the office a "very good " response, please contact our office to try to remedy the situation. We may be reached at [phone]. Thank you for taking the time out of your busy day to complete the survey.  Jacquelyne Balint, MD [facility]  Preventing Falls at [facility] are common, often dreaded events in the lives of older people. Aside from the obvious injuries and even death that may result, fall can cause wide-ranging consequences including loss of independence, mental decline, decreased activity and mobility. Younger people are also at risk of falling, especially those with chronic illnesses and fatigue.  Ways to reduce risk for falling Examine diet and medications. Warm foods and alcohol dilate blood vessels, which can lead to dizziness when standing. Sleep aids, antidepressants and pain medications can also increase the likelihood of a fall.  Get a vision exam. Poor vision, cataracts and glaucoma increase the chances of falling.  Check foot gear. Shoes should fit snugly and have a sturdy, nonskid sole and a broad, low heel  Participate in a physician-approved exercise program to build and maintain muscle strength and improve balance and coordination. Programs that use ankle weights or stretch bands are excellent for muscle-strengthening. Water aerobics programs and  low-impact Tai Chi programs have also been shown to improve balance and coordination.  Increase vitamin D intake. Vitamin D improves muscle strength and increases the amount of calcium the body is able to absorb and deposit in bones.  How to prevent falls from common hazards Floors - Remove all loose wires, cords, and throw rugs. Minimize clutter. Make sure rugs are anchored and smooth. Keep furniture in its usual place.  Chairs -- Use chairs with straight backs, armrests and firm seats. Add firm cushions to existing pieces to add height.  Bathroom - Install grab bars and non-skid tape in the tub or shower. Use a bathtub transfer bench or a shower chair with a back support Use an elevated toilet seat and/or safety rails to assist standing from a low surface. Do not use towel racks or bathroom tissue holders to help you stand.  Lighting - Make sure halls, stairways, and entrances are well-lit. Install a night light in your bathroom or hallway. Make sure there is a light switch at the top and bottom of the staircase. Turn lights on if you get up in the middle of the night. Make sure lamps or light switches are within reach of the bed if you have to get up during the night.  Kitchen - Install non-skid rubber mats near the sink and stove. Clean spills immediately. Store frequently used utensils, pots, pans between waist and eye level. This helps prevent reaching and bending. Sit when getting things out of lower cupboards.  Living room/ Bedrooms - Place furniture with wide spaces in between, giving enough room to move around. Establish a route through the  living room that gives you something to hold onto as you walk.  Stairs - Make sure treads, rails, and rugs are secure. Install a rail on both sides of the stairs. If stairs are a threat, it might be helpful to arrange most of your activities on the lower level to reduce the number of times you must climb the stairs.  Entrances and doorways - Install  metal handles on the walls adjacent to the doorknobs of all doors to make it more secure as you travel through the doorway.  Tips for maintaining balance Keep at least one hand free at all times. Try using a backpack or fanny pack to hold things rather than carrying them in your hands. Never carry objects in both hands when walking as this interferes with keeping your balance.  Attempt to swing both arms from front to back while walking. This might require a conscious effort if Parkinson's disease has diminished your movement. It will, however, help you to maintain balance and posture, and reduce fatigue.  Consciously lift your feet off of the ground when walking. Shuffling and dragging of the feet is a common culprit in losing your balance.  When trying to navigate turns, use a "U" technique of facing forward and making a wide turn, rather than pivoting sharply.  Try to stand with your feet shoulder-length apart. When your feet are close together for any length of time, you increase your risk of losing your balance and falling.  Do one thing at a time. Don't try to walk and accomplish another task, such as reading or looking around. The decrease in your automatic reflexes complicates motor function, so the less distraction, the better.  Do not wear rubber or gripping soled shoes, they might "catch" on the floor and cause tripping.  Move slowly when changing positions. Use deliberate, concentrated movements and, if needed, use a grab bar or walking aid. Count 15 seconds between each movement. For example, when rising from a seated position, wait 15 seconds after standing to begin walking.  If balance is a continuous problem, you might want to consider a walking aid such as a cane, walking stick, or walker. Once you've mastered walking with help, you might be ready to try it on your own again.

## 2023-04-25 NOTE — Addendum Note (Signed)
Addended by: Lenise Herald on: 04/25/2023 04:03 PM   Modules accepted: Orders

## 2023-04-26 ENCOUNTER — Ambulatory Visit: Payer: Medicare Other | Admitting: Dermatology

## 2023-04-26 VITALS — BP 138/91

## 2023-04-26 DIAGNOSIS — R202 Paresthesia of skin: Secondary | ICD-10-CM | POA: Diagnosis not present

## 2023-04-26 DIAGNOSIS — L739 Follicular disorder, unspecified: Secondary | ICD-10-CM | POA: Diagnosis not present

## 2023-04-26 DIAGNOSIS — L810 Postinflammatory hyperpigmentation: Secondary | ICD-10-CM

## 2023-04-26 DIAGNOSIS — L281 Prurigo nodularis: Secondary | ICD-10-CM

## 2023-04-26 MED ORDER — DOXYCYCLINE HYCLATE 50 MG PO CAPS: 50.0000 mg | ORAL_CAPSULE | Freq: Every day | ORAL | 3 refills | Status: DC

## 2023-04-26 MED ORDER — IVERMECTIN 0.5 % EX LOTN: 1.0000 "application " | TOPICAL_LOTION | Freq: Every morning | CUTANEOUS | 3 refills | Status: DC

## 2023-04-26 NOTE — Patient Instructions (Signed)
Doxycycline should be taken with food to prevent nausea. Do not lay down for 30 minutes after taking. Be cautious with sun exposure and use good sun protection while on this medication. Pregnant women should not take this medication.      Important Information  Due to recent changes in healthcare laws, you may see results of your pathology and/or laboratory studies on MyChart before the doctors have had a chance to review them. We understand that in some cases there may be results that are confusing or concerning to you. Please understand that not all results are received at the same time and often the doctors may need to interpret multiple results in order to provide you with the best plan of care or course of treatment. Therefore, we ask that you please give [place] 2 business days to thoroughly review all your results before contacting the office for clarification. Should we see a critical lab result, you will be contacted sooner.   If You Need Anything After Your Visit  If you have any questions or concerns for your doctor, please call our main line at [phone] If no one answers, please leave a voicemail as directed and we will return your call as soon as possible. Messages left after 4 pm will be answered the following business day.   You may also send [place] a message via MyChart. We typically respond to MyChart messages within 1-2 business days.  For prescription refills, please ask your pharmacy to contact our office. Our fax number is [phone].  If you have an urgent issue when the clinic is closed that cannot wait until the next business day, you can page your doctor at the number below.    Please note that while we do our best to be available for urgent issues outside of office hours, we are not available 24/7.   If you have an urgent issue and are unable to reach [place], you may choose to seek medical care at your doctor's office, retail clinic, urgent care center, or emergency  room.  If you have a medical emergency, please immediately call 911 or go to the emergency department. In the event of inclement weather, please call our main line at [phone] for an update on the status of any delays or closures.  Dermatology Medication Tips: Please keep the boxes that topical medications come in in order to help keep track of the instructions about where and how to use these. Pharmacies typically print the medication instructions only on the boxes and not directly on the medication tubes.   If your medication is too expensive, please contact our office at [phone] or send [place] a message through MyChart.   We are unable to tell what your co-pay for medications will be in advance as this is different depending on your insurance coverage. However, we may be able to find a substitute medication at lower cost or fill out paperwork to get insurance to cover a needed medication.   If a prior authorization is required to get your medication covered by your insurance company, please allow [place] 1-2 business days to complete this process.  Drug prices often vary depending on where the prescription is filled and some pharmacies may offer cheaper prices.  The website www.goodrx.com contains coupons for medications through different pharmacies. The prices here do not account for what the cost may be with help from insurance (it may be cheaper with your insurance), but the website can give you the price if you did  not use any insurance.  - You can print the associated coupon and take it with your prescription to the pharmacy.  - You may also stop by our office during regular business hours and pick up a GoodRx coupon card.  - If you need your prescription sent electronically to a different pharmacy, notify our office through [facility] or by phone at [phone]

## 2023-04-26 NOTE — Progress Notes (Signed)
   Follow-Up Visit   Subjective  Frances Nelson is a 67 y.o. female who presents for the following: Folliculitis follow up of face and behind ears - she took doxycycline 100 mg 1 po every day x 1 month. She is treating with adapalene gel 5 nights per week. She says condition is about the same and the left side of her face seems to be her worst area. .   The following portions of the chart were reviewed this encounter and updated as appropriate: medications, allergies, medical history  Review of Systems:  No other skin or systemic complaints except as noted in HPI or Assessment and Plan.  Objective  Well appearing patient in no apparent distress; mood and affect are within normal limits.   A focused examination was performed of the following areas: Face   Relevant exam findings are noted in the Assessment and Plan.    Assessment & Plan   Folliculitis  Exam:          1. Folliculitis - Assessment: Prior lesions healed and new lesions are excoriated from pt picking. - Plan: Continue doxycycline 50 mg daily with dinner, with a new prescription to be sent. Start ivermectin cream, applying a thin layer to affected areas including the upper back, in the morning. Continue using Adapalene 0.5% lotion at night, Monday through Friday, applying to the upper back as well, with breaks on weekends to avoid dryness. Moisturize after applying Adapalene. If dryness persists, reduce Adapalene application to Monday, Wednesday, and Friday.  2. Post-inflammatory Hyperpigmentation - Assessment: brown macules in areas of prior lesions - Plan: Continue using Adapalene 0.5% lotion at night to help unclog pores and lighten dark spots.  3. Notalgia paresthetica / Picker's Nodules - Assessment: excoriations on upper back. - Plan: Continue clobetasol for itching, using it for 2 weeks on and 2 weeks off to avoid skin thinning. Apply clobetasol instead of scratching. Monitor response to ivermectin  cream in the mornings to potentially reduce clobetasol use.    Return in about 3 months (around 07/27/2023) for Folliculitis.  I, Joanie Coddington, CMA, am acting as scribe for [employer], DO .   Documentation: I have reviewed the above documentation for accuracy and completeness, and I agree with the above.  Langston Reusing, DO

## 2023-04-27 ENCOUNTER — Encounter: Payer: Self-pay | Admitting: Physical Therapy

## 2023-04-27 ENCOUNTER — Ambulatory Visit: Payer: Medicare Other | Admitting: Physical Therapy

## 2023-04-27 DIAGNOSIS — M79662 Pain in left lower leg: Secondary | ICD-10-CM | POA: Diagnosis not present

## 2023-04-27 DIAGNOSIS — M6281 Muscle weakness (generalized): Secondary | ICD-10-CM | POA: Diagnosis not present

## 2023-04-27 DIAGNOSIS — R262 Difficulty in walking, not elsewhere classified: Secondary | ICD-10-CM

## 2023-04-27 DIAGNOSIS — M25562 Pain in left knee: Secondary | ICD-10-CM | POA: Diagnosis not present

## 2023-04-27 DIAGNOSIS — R600 Localized edema: Secondary | ICD-10-CM | POA: Diagnosis not present

## 2023-04-27 DIAGNOSIS — G8929 Other chronic pain: Secondary | ICD-10-CM

## 2023-04-27 NOTE — Therapy (Signed)
OUTPATIENT PHYSICAL THERAPY TREATMENT   Patient Name: Frances Nelson MRN: [id number] DOB:1955-08-12, 67 y.o., female Today's Date: 04/27/2023    END OF SESSION:  PT End of Session - 04/27/23 0851     Visit Number 18    Number of Visits 20    Date for PT Re-Evaluation 04/27/23    Authorization Type MCR    Progress Note Due on Visit 20    PT Start Time 0845    PT Stop Time 0927    PT Time Calculation (min) 42 min    Activity Tolerance Patient tolerated treatment well    Behavior During Therapy WFL for tasks assessed/performed              Past Medical History:  Diagnosis Date   Arthritis    Asthma    COPD (chronic obstructive pulmonary disease) (HCC)    GERD (gastroesophageal reflux disease)    Hypertension    Pneumonia    Past Surgical History:  Procedure Laterality Date   ABDOMINAL HYSTERECTOMY     APPENDECTOMY     HERNIA REPAIR     Umbilical x 3   KNEE ARTHROSCOPY W/ MENISCAL REPAIR Left 12/2022   TONSILLECTOMY     TRANSFORAMINAL LUMBAR INTERBODY FUSION (TLIF) WITH PEDICLE SCREW FIXATION 1 LEVEL N/A 05/18/2022   Procedure: Lumbar Four-Five Open Laminectomy/Transforaminal Lumbar Interbody Fusion/Posterolateral fusion;  Surgeon: Jadene Pierini, MD;  Location: [facility] OR;  Service: Neurosurgery;  Laterality: N/A;   Patient Active Problem List   Diagnosis Date Noted   Microhematuria 04/09/2023   Bilateral shoulder pain 04/09/2023   Urethritis 10/05/2022   External otitis of left ear 10/05/2022   Spondylolisthesis of lumbar region 05/18/2022   Smoker 04/08/2022   Deviated nasal septum 04/08/2022   Bilateral pain of leg and foot 04/08/2022   Follicular acne 04/06/2022   Baker cyst, left 04/06/2022   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   Depression 10/04/2021   Left knee pain 10/04/2021   Chronic pain 04/09/2021   Pelvic pain 04/09/2021   HLD (hyperlipidemia) 04/09/2021   Vitamin D deficiency 04/09/2021   Estrogen  deficiency 04/09/2021   Hyperglycemia 04/01/2021   Chronic cough 12/30/2020   GERD (gastroesophageal reflux disease) 11/06/2019   Bilateral leg edema 10/16/2019   Pleuritic chest pain 10/16/2019   Hypertension 10/02/2019   Arthralgia 06/28/2018   Sinusitis 06/28/2018   Renal cyst 05/03/2018   Syncope 05/03/2018   Stress and adjustment reaction 05/03/2018   History of tobacco use 05/02/2018   Primary osteoarthritis of both hands 04/10/2018   Primary osteoarthritis of both knees 04/10/2018   Primary osteoarthritis of both feet 04/10/2018   Achilles tendinitis 04/02/2018   Screening for breast cancer 03/15/2018   Positive ANA (antinuclear antibody) 03/15/2018   Screening for colon cancer 03/15/2018   Rash 03/15/2018   Achilles tendon pain 11/13/2017   Cervical myelopathy with cervical radiculopathy (HCC) 11/13/2017   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   Allergic rhinitis 06/19/2017   Edema 12/24/2015   Carpal tunnel syndrome, right 12/16/2015   Hypercalcemia 11/11/2015   Pain in both upper extremities 11/11/2015   Drug reaction 10/28/2015   Muscle spasms of both lower extremities 10/28/2015   Nerve pain 10/28/2015   Chronic asthmatic bronchitis with acute exacerbation (HCC) 09/06/2015   Environmental and seasonal allergies 07/17/2014    PCP: Corwin Levins MD  REFERRING PROVIDER: Kathryne Hitch, MD  REFERRING DIAG: [phone] (ICD-10-CM) - Status post arthroscopic surgery of left knee  M76.60 (ICD-10-CM) - Achilles tendon pain  THERAPY DIAG:  Chronic pain of left knee  Pain in left lower leg  Muscle weakness (generalized)  Localized edema  Difficulty in walking, not elsewhere classified  Rationale for Evaluation and Treatment: Rehabilitation  ONSET DATE: Surgery 12/21/2022 on Lt knee  SUBJECTIVE:   SUBJECTIVE STATEMENT: Pt states pain is overall about the same, she will see PA on Monday to review MRI results of her knee  PERTINENT HISTORY: History of  05/2022 lumbar surgery, GERD, COPD, HTN, history of arthritis  PAIN:  NPRS scale: Lt ankle:  6/10, Lt knee 6/10 Pain location: Lt knee, Lt achilles Pain description: constant, throbbing,  Aggravating factors: WB activity Relieving factors: nothing specific   PRECAUTIONS: None  WEIGHT BEARING RESTRICTIONS: No  FALLS:  Has patient fallen in last 6 months? 1 fall without medical attention.   LIVING ENVIRONMENT: Lives in: House/apartment Stairs: multistory with bedroom upstairs but has moved bed downstairs.   Rail on Lt side going up.   OCCUPATION: Retired   PLOF: Independent, walking for exercise (5-6 miles/day in the past until 2012).  Dog walking   PATIENT GOALS: Reduce pain, move better, walk better without cane.   OBJECTIVE: (objective measures completed at initial evaluation unless otherwise dated)   PATIENT SURVEYS:  03/26/2023:  FOTO update:  44  02/16/2023 FOTO intake:  38  predicted:  54  COGNITION: 02/16/2023 Overall cognitive status: WFL    SENSATION: 02/16/2023 No specific check today  EDEMA:  02/16/2023 Lt leg edema noted from knee down compared to Lt.   MUSCLE LENGTH: 02/16/2023 No specific testing  POSTURE:  02/16/2023 Reduced lumbar lordosis in standing, Weight shift off Lt leg to Rt.   PALPATION: 02/16/2023 Tenderness around swollen Lt ankle, Lt knee to light touch.   LOWER EXTREMITY ROM:   ROM Right 02/16/2023 Left 02/16/2023 Left 02/21/2023 Left 03/02/2023 Left 03/26/2023  Hip flexion       Hip extension       Hip abduction       Hip adduction       Hip internal rotation       Hip external rotation       Knee flexion 125 92 AROM in supine heel slide c pain PROM 95/100 grossly assesed in sitting.  90 AROM in supine heel slide c pain 96 AROM in supine heel slide c pain  Knee extension 0 0 AROM in quad set supine  0 AROM in seated quad set   Ankle dorsiflexion       Ankle plantarflexion       Ankle inversion       Ankle eversion        (Blank rows =  not tested)  LOWER EXTREMITY MMT:  MMT Right 02/16/2023 Left 02/16/2023 Left 03/13/2023 Right 03/26/2023 Left 03/26/2023 Left 04/10/2023  Hip flexion 5/5 4/5      Hip extension        Hip abduction        Hip adduction        Hip internal rotation        Hip external rotation        Knee flexion 5/5 4/5 4/5  4+/5   Knee extension 4/5 3+/5 4-/5 5/5 52.9, 52 lbs  4-/5  15.9, 16.5 lbs with pain   Ankle dorsiflexion 5/5 3+/5 c pain 4/5  4+/5 5/5  Ankle plantarflexion  2+/5 c pain (unable to perform standing PF) 2+/5 c pain (unable to perform standing PF)  2+/5   Ankle inversion 5/5 3+/5 c pain 4/5 c pain  4+/5   Ankle eversion 5/5 3+/5 c pain 3+/5 c pain  4/5    (Blank rows = not tested)  LOWER EXTREMITY SPECIAL TESTS:  02/16/2023 No specific testing today.   FUNCTIONAL TESTS:  04/16/2023:   Lt SLS: 4 seconds Rt SLS:   7 seconds  03/26/2023:  18 inch chair transfer: 2 tries required  02/16/2023 18 inch chair transfer: multiple tries required c noted deviation to Rt leg Lt SLS:  < 3 seconds Rt SLS: < 3 seconds  GAIT: 02/16/2023 Mod independent ambulation c SPC in Rt UE, decreased stance on Lt leg, decreased toe off progression noted.                                                                                                                                                                          TODAY'S TREATMENT                                                                             DATE: 04/27/23 Pt seen for aquatic therapy today.  Treatment took place in water 3.5-4.75 ft in depth at the [facility] pool. Temp of water was 91.  Pt entered/exited the pool via stairs with hand rail.   Pt requires the buoyancy and hydrostatic pressure of water for support, and to offload joints by unweighting joint load by at least 50 % in navel deep water and by at least 75-80% in chest to neck deep water.  Viscosity of the water is needed for resistance of strengthening. Water  current perturbations provides challenge to standing balance requiring increased core activation. Aquatic PT exercises Sidestepping length of pool 3 round trips with mini squat Forward walking  3 round trips backward walking 3 round trips March walking 3 round trips with water dumbells Tandem walk 3 round trips with water dumbells Leg swings hip abd/add X 20 bilat with UE support with UE support Leg swings hip flexion/extension X 20 bilat Gastroc stretch 30 sec X 3 off of first step Step ups onto first step of pool X10 bilat with UE support Heel raises 2X10 with UE support off of first step Squats X15 with UE support off of first step Hip circles CW and CCW X 15 each bilat 1/4 squat with shoulder extension X20 with kickboard 1/4 lunge with push/pull kickboard 2X15 Lunge step to pool wall,  touch wall, then step back, X 15 bilat Monster walks 3 round trips with dumbells Seated on pool chair and cycling 3 min  04/24/2023 Therapeutic Exercise: Nustep  lvl 6 12 mins UE/LE Slantboard gastroc stretch 30 sec X 3 Standing heel and toe raises 2X10 Seated SLR with DF hold 2 x 10 performed  Seated LAQ with end range pauses each direction 2 x 15, performed bilaterally    Neuro Re-ed Tandem walking down forward and back backwards 5 trips in bars with intermittent UE support PRN SLS with Stepping over 6 inch hurdle front to back X 10 each leg  Modalities: Vaso Lt knee/ankle combo, medium compression 34 deg , supine w/ LE elevation 04/16/2023 Therapeutic Exercise: Nustep  lvl 6 12 mins UE/LE Seated SLR with DF hold 2 x 10 performed  Seated LAQ with end range pauses each direction x 15, performed bilaterally  DF calf stretch in long sitting 30 sec x 3  Neuro Re-ed Tandem stance 1 min x 1 bilateral in // bars with occasional HHA BIG inspired weight shift stepping fwd and back x 10 each exercise, each leg.  In // bars with occasional HHA  Modalities: Vaso Lt knee/ankle combo, medium  compression 34 deg , supine w/ LE elevation    PATIENT EDUCATION:  03/26/2023  Education details: HEP update Person educated: Patient Education method: Programmer, multimedia, Demonstration, Verbal cues, and Handouts Education comprehension: verbalized understanding, returned demonstration, and verbal cues required  HOME EXERCISE PROGRAM: Access Code: [email or web address] URL: [email or web address] Date: 03/26/2023 Prepared by: Chyrel Masson  Exercises - Supine Heel Slide (Mirrored)  - 3-5 x daily - 7 x weekly - 1-2 sets - 10 reps - 2 hold - Seated Long Arc Quad (Mirrored)  - 3-5 x daily - 7 x weekly - 1-2 sets - 10 reps - 2 hold - Seated Gastroc Stretch with Strap (Mirrored)  - 2-3 x daily - 7 x weekly - 1 sets - 3 reps - 30 hold - Seated Quad Set (Mirrored)  - 3-5 x daily - 7 x weekly - 1 sets - 10 reps - 5 hold - Seated Straight Leg Heel Taps  - 1-2 x daily - 7 x weekly - 2-3 sets - 10-15 reps - 2-3 hold - Long Sitting Eccentric Ankle Plantar Flexion with Resistance  - 1-2 x daily - 7 x weekly - 3 sets - 10 reps - Long Sitting Ankle Eversion with Resistance  - 1 x daily - 7 x weekly - 1-2 sets - 10-15 reps - Long Sitting Ankle Inversion with Resistance  - 1 x daily - 7 x weekly - 1-2 sets - 10-15 reps - Seated Ankle Dorsiflexion with Resistance  - 1 x daily - 7 x weekly - 1-2 sets - 10-15 reps  ASSESSMENT:  CLINICAL IMPRESSION: Still with pain that has not changed much overall with PT. She will meet with MD Monday about knee MRI results and we will reassess after that to see if further PT is indicated vs transition to independent program.  OBJECTIVE IMPAIRMENTS: Abnormal gait, decreased activity tolerance, decreased balance, decreased coordination, decreased endurance, decreased mobility, difficulty walking, decreased ROM, decreased strength, hypomobility, increased edema, impaired perceived functional ability, impaired flexibility, improper body mechanics, postural dysfunction, and  pain.   ACTIVITY LIMITATIONS: carrying, lifting, bending, sitting, standing, squatting, sleeping, stairs, transfers, bed mobility, bathing, toileting, dressing, and locomotion level  PARTICIPATION LIMITATIONS: meal prep, cleaning, laundry, interpersonal relationship, driving, shopping, and community activity  PERSONAL FACTORS:  History of 05/2022 lumbar  surgery, GERD, COPD, HTN, history of arthritis, multiple treatment areas current  are also affecting patient's functional outcome.   REHAB POTENTIAL: Fair to good  CLINICAL DECISION MAKING: Evolving/moderate complexity  EVALUATION COMPLEXITY: Moderate   GOALS: Goals reviewed with patient? Yes  SHORT TERM GOALS: (target date for Short term goals are 3 weeks 03/09/2023)   1.  Patient will demonstrate independent use of home exercise program to maintain progress from in clinic treatments.  Goal status: Met   LONG TERM GOALS: (target dates for all long term goals are 10 weeks  04/27/2023 )   1. Patient will demonstrate/report pain at worst less than or equal to 2/10 to facilitate minimal limitation in daily activity secondary to pain symptoms.  Goal status: on going 03/26/2023   2. Patient will demonstrate independent use of home exercise program to facilitate ability to maintain/progress functional gains from skilled physical therapy services.  Goal status: on going 03/26/2023   3. Patient will demonstrate FOTO outcome > or = 54 % to indicate reduced disability due to condition.  Goal status: on going 03/26/2023   4.  Patient will demonstrate Lt LE MMT 5/5 throughout to faciltiate usual transfers, stairs, squatting at [facility] for daily life.   Goal status: on going 03/26/2023   5.  Patient will demonstrate ability to ascend/descend stairs reciprocal gait pattern with single hand rail assist.  Goal status: on going 03/26/2023   6.  Patient will demonstrate Lt knee AROM 0-120 deg to facilitate ability to perform transfers, ambulation and  other activity in daily life.  Goal status: on going 03/26/2023   7.  Patient will demonstrate ability to ambulation community distances > 500 ft.  Goal Status: on going 03/26/2023   PLAN:  PT FREQUENCY: 1-2x/week  PT DURATION: 10 weeks  PLANNED INTERVENTIONS: Therapeutic exercises, Therapeutic activity, Neuro Muscular re-education, Balance training, Gait training, Patient/Family education, Joint mobilization, Stair training, DME instructions, Dry Needling, Electrical stimulation, Traction, Cryotherapy, vasopneumatic deviceMoist heat, Taping, Ultrasound, Ionotophoresis 4mg /ml Dexamethasone, and aquatic therapy, Manual therapy.  All included unless contraindicated  PLAN FOR NEXT SESSION: check LTG and MD progress note. Will need recert to cover her remaining visits scheduled. Then will need to reassess after MRI results to determine if any further PT is indicated or transition to independent program.  Ivery Quale, PT, DPT 04/27/23 8:53 AM

## 2023-04-30 ENCOUNTER — Encounter: Payer: Self-pay | Admitting: Dermatology

## 2023-04-30 ENCOUNTER — Ambulatory Visit: Payer: Medicare Other | Admitting: Physician Assistant

## 2023-04-30 ENCOUNTER — Ambulatory Visit: Payer: Medicare Other | Admitting: Rehabilitative and Restorative Service Providers"

## 2023-04-30 ENCOUNTER — Encounter: Payer: Self-pay | Admitting: Rehabilitative and Restorative Service Providers"

## 2023-04-30 DIAGNOSIS — M25562 Pain in left knee: Secondary | ICD-10-CM

## 2023-04-30 DIAGNOSIS — R600 Localized edema: Secondary | ICD-10-CM

## 2023-04-30 DIAGNOSIS — R262 Difficulty in walking, not elsewhere classified: Secondary | ICD-10-CM

## 2023-04-30 DIAGNOSIS — M79662 Pain in left lower leg: Secondary | ICD-10-CM | POA: Diagnosis not present

## 2023-04-30 DIAGNOSIS — M6281 Muscle weakness (generalized): Secondary | ICD-10-CM | POA: Diagnosis not present

## 2023-04-30 DIAGNOSIS — G8929 Other chronic pain: Secondary | ICD-10-CM | POA: Diagnosis not present

## 2023-04-30 MED ORDER — LIDOCAINE 5 % EX PTCH: 1.0000 | MEDICATED_PATCH | CUTANEOUS | 0 refills | Status: DC

## 2023-04-30 NOTE — Therapy (Signed)
OUTPATIENT PHYSICAL THERAPY TREATMENT / RECERT/ PROGRESS NOTE  Patient Name: Frances Nelson MRN: [id number] DOB:1956/05/02, 67 y.o., female Today's Date: 04/30/2023  Progress Note Reporting Period 03/26/2023 to 04/30/2023  See note below for Objective Data and Assessment of Progress/Goals.       END OF SESSION:  PT End of Session - 04/30/23 1018     Visit Number 19    Number of Visits 29    Date for PT Re-Evaluation 06/11/23    Authorization Type MCR    Authorization Time Period KX required past 15 visits    Progress Note Due on Visit 29    PT Start Time 1012    PT Stop Time 1102    PT Time Calculation (min) 50 min    Activity Tolerance Patient limited by pain    Behavior During Therapy WFL for tasks assessed/performed               Past Medical History:  Diagnosis Date   Arthritis    Asthma    COPD (chronic obstructive pulmonary disease) (HCC)    GERD (gastroesophageal reflux disease)    Hypertension    Pneumonia    Past Surgical History:  Procedure Laterality Date   ABDOMINAL HYSTERECTOMY     APPENDECTOMY     HERNIA REPAIR     Umbilical x 3   KNEE ARTHROSCOPY W/ MENISCAL REPAIR Left 12/2022   TONSILLECTOMY     TRANSFORAMINAL LUMBAR INTERBODY FUSION (TLIF) WITH PEDICLE SCREW FIXATION 1 LEVEL N/A 05/18/2022   Procedure: Lumbar Four-Five Open Laminectomy/Transforaminal Lumbar Interbody Fusion/Posterolateral fusion;  Surgeon: Jadene Pierini, MD;  Location: [facility] OR;  Service: Neurosurgery;  Laterality: N/A;   Patient Active Problem List   Diagnosis Date Noted   Microhematuria 04/09/2023   Bilateral shoulder pain 04/09/2023   Urethritis 10/05/2022   External otitis of left ear 10/05/2022   Spondylolisthesis of lumbar region 05/18/2022   Smoker 04/08/2022   Deviated nasal septum 04/08/2022   Bilateral pain of leg and foot 04/08/2022   Follicular acne 04/06/2022   Baker cyst, left 04/06/2022   Spinal stenosis of lumbar region with neurogenic  claudication 10/28/2021   Depression 10/04/2021   Left knee pain 10/04/2021   Chronic pain 04/09/2021   Pelvic pain 04/09/2021   HLD (hyperlipidemia) 04/09/2021   Vitamin D deficiency 04/09/2021   Estrogen deficiency 04/09/2021   Hyperglycemia 04/01/2021   Chronic cough 12/30/2020   GERD (gastroesophageal reflux disease) 11/06/2019   Bilateral leg edema 10/16/2019   Pleuritic chest pain 10/16/2019   Hypertension 10/02/2019   Arthralgia 06/28/2018   Sinusitis 06/28/2018   Renal cyst 05/03/2018   Syncope 05/03/2018   Stress and adjustment reaction 05/03/2018   History of tobacco use 05/02/2018   Primary osteoarthritis of both hands 04/10/2018   Primary osteoarthritis of both knees 04/10/2018   Primary osteoarthritis of both feet 04/10/2018   Achilles tendinitis 04/02/2018   Screening for breast cancer 03/15/2018   Positive ANA (antinuclear antibody) 03/15/2018   Screening for colon cancer 03/15/2018   Rash 03/15/2018   Achilles tendon pain 11/13/2017   Cervical myelopathy with cervical radiculopathy (HCC) 11/13/2017   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   Allergic rhinitis 06/19/2017   Edema 12/24/2015   Carpal tunnel syndrome, right 12/16/2015   Hypercalcemia 11/11/2015   Pain in both upper extremities 11/11/2015   Drug reaction 10/28/2015   Muscle spasms of both lower extremities 10/28/2015   Nerve pain 10/28/2015   Chronic asthmatic bronchitis with acute exacerbation (  HCC) 09/06/2015   Environmental and seasonal allergies 07/17/2014    PCP: Oliver Barre, Lacretia Nicks MD  REFERRING PROVIDER: Kathryne Hitch, MD  REFERRING DIAG: [phone] (ICD-10-CM) - Status post arthroscopic surgery of left knee M76.60 (ICD-10-CM) - Achilles tendon pain  THERAPY DIAG:  Chronic pain of left knee  Pain in left lower leg  Muscle weakness (generalized)  Localized edema  Difficulty in walking, not elsewhere classified  Rationale for Evaluation and Treatment: Rehabilitation  ONSET  DATE: Surgery 12/21/2022 on Lt knee  SUBJECTIVE:   SUBJECTIVE STATEMENT: Pt indicated doing walking without the cane at home.  She did choose today to walk into clinic without it.  Pt indicated therapy and aquatic therapy has "helped a lot."     Overall improvement to normal rated about 60%.  Global rating of change indicated at +5 quite a bit better.   PERTINENT HISTORY: History of 05/2022 lumbar surgery, GERD, COPD, HTN, history of arthritis  PAIN:  NPRS scale: Lt ankle:  6/10, Lt knee 4/10 Pain location: Lt knee, Lt achilles Pain description: constant, throbbing,  Aggravating factors: household cleaning, WB, stairs Relieving factors: nothing specific   PRECAUTIONS: None  WEIGHT BEARING RESTRICTIONS: No  FALLS:  Has patient fallen in last 6 months? 1 fall without medical attention.   LIVING ENVIRONMENT: Lives in: House/apartment Stairs: multistory with bedroom upstairs but has moved bed downstairs.   Rail on Lt side going up.   OCCUPATION: Retired   PLOF: Independent, walking for exercise (5-6 miles/day in the past until 2012).  Dog walking   PATIENT GOALS: Reduce pain, move better, walk better without cane.   OBJECTIVE: (objective measures completed at initial evaluation unless otherwise dated)   PATIENT SURVEYS:  04/30/2023: FOTO update:   40.2  03/26/2023:  FOTO update:  44  02/16/2023 FOTO intake:  38  predicted:  54  COGNITION: 02/16/2023 Overall cognitive status: WFL    SENSATION: 02/16/2023 No specific check today  EDEMA:  02/16/2023 Lt leg edema noted from knee down compared to Lt.   MUSCLE LENGTH: 02/16/2023 No specific testing  POSTURE:  02/16/2023 Reduced lumbar lordosis in standing, Weight shift off Lt leg to Rt.   PALPATION: 02/16/2023 Tenderness around swollen Lt ankle, Lt knee to light touch.   LOWER EXTREMITY ROM:   ROM Right 02/16/2023 Left 02/16/2023 Left 02/21/2023 Left 03/02/2023 Left 03/26/2023 Left 04/30/2023  Hip flexion        Hip extension         Hip abduction        Hip adduction        Hip internal rotation        Hip external rotation        Knee flexion 125 92 AROM in supine heel slide c pain PROM 95/100 grossly assesed in sitting.  90 AROM in supine heel slide c pain 96 AROM in supine heel slide c pain 98 AROM in heel slide  104 AAROM in heel slide   Knee extension 0 0 AROM in quad set supine  0 AROM in seated quad set  0 AROM in supine  Ankle dorsiflexion      0  Ankle plantarflexion      45  Ankle inversion        Ankle eversion         (Blank rows = not tested)  LOWER EXTREMITY MMT:  MMT Right 02/16/2023 Left 02/16/2023 Left 03/13/2023 Right 03/26/2023 Left 03/26/2023 Left 04/10/2023 Left 04/30/2023  Hip flexion 5/5 4/5  4+/5  Hip extension         Hip abduction         Hip adduction         Hip internal rotation         Hip external rotation         Knee flexion 5/5 4/5 4/5  4+/5    Knee extension 4/5 3+/5 4-/5 5/5 52.9, 52 lbs  4-/5  15.9, 16.5 lbs with pain  4/5 21.4, 25.9 lbs  Ankle dorsiflexion 5/5 3+/5 c pain 4/5  4+/5 5/5   Ankle plantarflexion  2+/5 c pain (unable to perform standing PF) 2+/5 c pain (unable to perform standing PF)  2+/5  3/5 (1 rep in standing single leg PF ,pain noted)  Ankle inversion 5/5 3+/5 c pain 4/5 c pain  4+/5    Ankle eversion 5/5 3+/5 c pain 3+/5 c pain  4/5     (Blank rows = not tested)  LOWER EXTREMITY SPECIAL TESTS:  02/16/2023 No specific testing today.   FUNCTIONAL TESTS:  04/30/2023: Rt SLS 6 seconds Lt SLS 5 seconds  TUG independent:  17.76 seconds  04/16/2023:   Lt SLS: 4 seconds Rt SLS:   7 seconds  03/26/2023:  18 inch chair transfer: 2 tries required  02/16/2023 18 inch chair transfer: multiple tries required c noted deviation to Rt leg Lt SLS:  < 3 seconds Rt SLS: < 3 seconds  GAIT: 02/16/2023 Mod independent ambulation c SPC in Rt UE, decreased stance on Lt leg, decreased toe off progression noted.                                                                                                                                                                           TODAY'S TREATMENT                                                                             DATE:  04/30/2023 Therapeutic Exercise: Nustep  lvl 6 12 mins UE/LE Slantboard gastroc stretch 30 sec X 3 TUG x 1 Supine Lt knee heel slide 5 sec hold c combo to quad set c DF 5 sec hold  x 10 Supine 90 deg hip flexion LAQ c strap holding leg 2 x 10 Lt leg  Neuro Re-ed SLS testing trials x 2 bilaterally Tandem stance 1 min x 2 bilateral with occasional to moderate HHA on bars  Modalities: Vaso  Lt ankle, medium compression 34 deg , supine w/ LE elevation  TODAY'S TREATMENT                                                                             DATE: 04/27/23 Pt seen for aquatic therapy today.  Treatment took place in water 3.5-4.75 ft in depth at the [facility] pool. Temp of water was 91.  Pt entered/exited the pool via stairs with hand rail.   Pt requires the buoyancy and hydrostatic pressure of water for support, and to offload joints by unweighting joint load by at least 50 % in navel deep water and by at least 75-80% in chest to neck deep water.  Viscosity of the water is needed for resistance of strengthening. Water current perturbations provides challenge to standing balance requiring increased core activation. Aquatic PT exercises Sidestepping length of pool 3 round trips with mini squat Forward walking  3 round trips backward walking 3 round trips March walking 3 round trips with water dumbells Tandem walk 3 round trips with water dumbells Leg swings hip abd/add X 20 bilat with UE support with UE support Leg swings hip flexion/extension X 20 bilat Gastroc stretch 30 sec X 3 off of first step Step ups onto first step of pool X10 bilat with UE support Heel raises 2X10 with UE support off of first step Squats X15 with UE support off of first  step Hip circles CW and CCW X 15 each bilat 1/4 squat with shoulder extension X20 with kickboard 1/4 lunge with push/pull kickboard 2X15 Lunge step to pool wall, touch wall, then step back, X 15 bilat Monster walks 3 round trips with dumbells Seated on pool chair and cycling 3 min  04/24/2023 Therapeutic Exercise: Nustep  lvl 6 12 mins UE/LE Slantboard gastroc stretch 30 sec X 3 Standing heel and toe raises 2X10 Seated SLR with DF hold 2 x 10 performed  Seated LAQ with end range pauses each direction 2 x 15, performed bilaterally    Neuro Re-ed Tandem walking down forward and back backwards 5 trips in bars with intermittent UE support PRN SLS with Stepping over 6 inch hurdle front to back X 10 each leg  Modalities: Vaso Lt knee/ankle combo, medium compression 34 deg , supine w/ LE elevation   PATIENT EDUCATION:  03/26/2023  Education details: HEP update Person educated: Patient Education method: Programmer, multimedia, Demonstration, Verbal cues, and Handouts Education comprehension: verbalized understanding, returned demonstration, and verbal cues required  HOME EXERCISE PROGRAM: Access Code: [email or web address] URL: [email or web address] Date: 03/26/2023 Prepared by: Chyrel Masson  Exercises - Supine Heel Slide (Mirrored)  - 3-5 x daily - 7 x weekly - 1-2 sets - 10 reps - 2 hold - Seated Long Arc Quad (Mirrored)  - 3-5 x daily - 7 x weekly - 1-2 sets - 10 reps - 2 hold - Seated Gastroc Stretch with Strap (Mirrored)  - 2-3 x daily - 7 x weekly - 1 sets - 3 reps - 30 hold - Seated Quad Set (Mirrored)  - 3-5 x daily - 7 x weekly - 1 sets - 10 reps - 5 hold - Seated Straight Leg Heel Taps  - 1-2 x daily - 7 x  weekly - 2-3 sets - 10-15 reps - 2-3 hold - Long Sitting Eccentric Ankle Plantar Flexion with Resistance  - 1-2 x daily - 7 x weekly - 3 sets - 10 reps - Long Sitting Ankle Eversion with Resistance  - 1 x daily - 7 x weekly - 1-2 sets - 10-15 reps - Long Sitting Ankle  Inversion with Resistance  - 1 x daily - 7 x weekly - 1-2 sets - 10-15 reps - Seated Ankle Dorsiflexion with Resistance  - 1 x daily - 7 x weekly - 1-2 sets - 10-15 reps  ASSESSMENT:  CLINICAL IMPRESSION: Pt has attended 19 visits overall during course of treatment, reporting 65% overall improvement to normal and +5 on global rating of change.  Pt has demonstrated objective improvement in measured data from eval to today.  Presentation of continued Lt foot/knee complaints with mobility, strength and movement coordination deficits that impair functional activity and tolerance to WB activity that warrant continued skilled PT services at this time. Medical necessity for continued treatment indicated to help improve safety and independence in ambulation, improve community ambulation distance tolerance and progress to improve home activity of daily life.    OBJECTIVE IMPAIRMENTS: Abnormal gait, decreased activity tolerance, decreased balance, decreased coordination, decreased endurance, decreased mobility, difficulty walking, decreased ROM, decreased strength, hypomobility, increased edema, impaired perceived functional ability, impaired flexibility, improper body mechanics, postural dysfunction, and pain.   ACTIVITY LIMITATIONS: carrying, lifting, bending, sitting, standing, squatting, sleeping, stairs, transfers, bed mobility, bathing, toileting, dressing, and locomotion level  PARTICIPATION LIMITATIONS: meal prep, cleaning, laundry, interpersonal relationship, driving, shopping, and community activity  PERSONAL FACTORS:  History of 05/2022 lumbar surgery, GERD, COPD, HTN, history of arthritis, multiple treatment areas current  are also affecting patient's functional outcome.   REHAB POTENTIAL: Fair to good  CLINICAL DECISION MAKING: Evolving/moderate complexity  EVALUATION COMPLEXITY: Moderate   GOALS: Goals reviewed with patient? Yes  SHORT TERM GOALS: (target date for Short term goals are 3  weeks 03/09/2023)   1.  Patient will demonstrate independent use of home exercise program to maintain progress from in clinic treatments.  Goal status: Met   LONG TERM GOALS: (target dates for all long term goals are 10 weeks  06/11/2023 )   1. Patient will demonstrate/report pain at worst less than or equal to 2/10 to facilitate minimal limitation in daily activity secondary to pain symptoms.  Goal status: revised date    2. Patient will demonstrate independent use of home exercise program to facilitate ability to maintain/progress functional gains from skilled physical therapy services.  Goal status: revised date    3. Patient will demonstrate FOTO outcome > or = 54 % to indicate reduced disability due to condition.  Goal status:revised date    4.  Patient will demonstrate Lt LE MMT 5/5 throughout to faciltiate usual transfers, stairs, squatting at [facility] for daily life.   Goal status: revised date    5.  Patient will demonstrate ability to ascend/descend stairs reciprocal gait pattern with single hand rail assist.  Goal status: revised date    6.  Patient will demonstrate Lt knee AROM 0-110 deg to facilitate ability to perform transfers, ambulation and other activity in daily life.  Goal status: revised date/ROM    7.  Patient will demonstrate ability to ambulation community distances > 500 ft.  Goal Status: revised date  8. Patient will demonstrate TUG independent or with cane < 14 seconds.   Goal status: new goal   PLAN:  PT FREQUENCY: 1-2x/week  PT DURATION: 6 weeks  (added 2 weeks for additional time)  PLANNED INTERVENTIONS: Therapeutic exercises, Therapeutic activity, Neuro Muscular re-education, Balance training, Gait training, Patient/Family education, Joint mobilization, Stair training, DME instructions, Dry Needling, Electrical stimulation, Traction, Cryotherapy, vasopneumatic deviceMoist heat, Taping, Ultrasound, Ionotophoresis 4mg /ml Dexamethasone, and aquatic  therapy, Manual therapy.  All included unless contraindicated  PLAN FOR NEXT SESSION: Follow up on MD visit from today.  Progressive strengthening/balance.  Aquatic.   Check of [employer] investigation for long term HEP.   Chyrel Masson, PT, DPT, OCS, ATC 04/30/23  10:56 AM

## 2023-04-30 NOTE — Progress Notes (Signed)
HPI: Mrs.  Nelson returns today to go over the MRI of her left knee.  MRI with and without contrast left knee was performed on 04/26/2023.  MRI shows mild degenerative changes patellofemoral joint and moderate degenerative changes medial compartment.  Lateral compartment unremarkable.  Mild synovitis superior and Hoffa's fat pad.  Prepatellar subcutaneous edema that tracks anterior to the patellar tendon into the patella.  Radiologist felt there was supra adipose tissue anterior to the distal quadriceps tendon and upper patella which could represent lipoma.  No worrisome features.  Physical exam: Left knee palpable nodule area at the insertion of the patella tendon.  No abnormal warmth erythema.  Slight tenderness to touch.  Otherwise good range of motion of the knee.  Impression: Left knee pain  Plan: Given the prepatellar subcutaneous edema involvement the anterior patellar tendon recommend lidocaine patches at night and Voltaren gel 4 g 4 times daily to the knee distal patella tendon region.  She will follow-up with Dr. Magnus Ivan in 3 months see how she is doing overall.  Questions were encouraged and answered at length.

## 2023-05-04 ENCOUNTER — Other Ambulatory Visit: Payer: Self-pay | Admitting: Urology

## 2023-05-04 ENCOUNTER — Encounter: Payer: Self-pay | Admitting: Urology

## 2023-05-04 DIAGNOSIS — R310 Gross hematuria: Secondary | ICD-10-CM

## 2023-05-09 ENCOUNTER — Ambulatory Visit (INDEPENDENT_AMBULATORY_CARE_PROVIDER_SITE_OTHER): Payer: Medicare Other | Admitting: Rehabilitative and Restorative Service Providers"

## 2023-05-09 ENCOUNTER — Encounter: Payer: Self-pay | Admitting: Rehabilitative and Restorative Service Providers"

## 2023-05-09 DIAGNOSIS — M6281 Muscle weakness (generalized): Secondary | ICD-10-CM | POA: Diagnosis not present

## 2023-05-09 DIAGNOSIS — G8929 Other chronic pain: Secondary | ICD-10-CM

## 2023-05-09 DIAGNOSIS — R600 Localized edema: Secondary | ICD-10-CM | POA: Diagnosis not present

## 2023-05-09 DIAGNOSIS — M25562 Pain in left knee: Secondary | ICD-10-CM | POA: Diagnosis not present

## 2023-05-09 DIAGNOSIS — M79662 Pain in left lower leg: Secondary | ICD-10-CM | POA: Diagnosis not present

## 2023-05-09 NOTE — Therapy (Signed)
OUTPATIENT PHYSICAL THERAPY TREATMENT  Patient Name: Frances Nelson MRN: [id number] DOB:January 07, 1956, 67 y.o., female Today's Date: 05/09/2023  END OF SESSION:  PT End of Session - 05/09/23 1155     Visit Number 20    Number of Visits 29    Date for PT Re-Evaluation 06/11/23    Authorization Type MCR    Authorization Time Period KX required past 15 visits    Progress Note Due on Visit 29    PT Start Time 1144    PT Stop Time 1234    PT Time Calculation (min) 50 min    Activity Tolerance Patient tolerated treatment well    Behavior During Therapy WFL for tasks assessed/performed                Past Medical History:  Diagnosis Date   Arthritis    Asthma    COPD (chronic obstructive pulmonary disease) (HCC)    GERD (gastroesophageal reflux disease)    Hypertension    Pneumonia    Past Surgical History:  Procedure Laterality Date   ABDOMINAL HYSTERECTOMY     APPENDECTOMY     HERNIA REPAIR     Umbilical x 3   KNEE ARTHROSCOPY W/ MENISCAL REPAIR Left 12/2022   TONSILLECTOMY     TRANSFORAMINAL LUMBAR INTERBODY FUSION (TLIF) WITH PEDICLE SCREW FIXATION 1 LEVEL N/A 05/18/2022   Procedure: Lumbar Four-Five Open Laminectomy/Transforaminal Lumbar Interbody Fusion/Posterolateral fusion;  Surgeon: Jadene Pierini, MD;  Location: [facility] OR;  Service: Neurosurgery;  Laterality: N/A;   Patient Active Problem List   Diagnosis Date Noted   Microhematuria 04/09/2023   Bilateral shoulder pain 04/09/2023   Urethritis 10/05/2022   External otitis of left ear 10/05/2022   Spondylolisthesis of lumbar region 05/18/2022   Smoker 04/08/2022   Deviated nasal septum 04/08/2022   Bilateral pain of leg and foot 04/08/2022   Follicular acne 04/06/2022   Baker cyst, left 04/06/2022   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   Depression 10/04/2021   Left knee pain 10/04/2021   Chronic pain 04/09/2021   Pelvic pain 04/09/2021   HLD (hyperlipidemia)  04/09/2021   Vitamin D deficiency 04/09/2021   Estrogen deficiency 04/09/2021   Hyperglycemia 04/01/2021   Chronic cough 12/30/2020   GERD (gastroesophageal reflux disease) 11/06/2019   Bilateral leg edema 10/16/2019   Pleuritic chest pain 10/16/2019   Hypertension 10/02/2019   Arthralgia 06/28/2018   Sinusitis 06/28/2018   Renal cyst 05/03/2018   Syncope 05/03/2018   Stress and adjustment reaction 05/03/2018   History of tobacco use 05/02/2018   Primary osteoarthritis of both hands 04/10/2018   Primary osteoarthritis of both knees 04/10/2018   Primary osteoarthritis of both feet 04/10/2018   Achilles tendinitis 04/02/2018   Screening for breast cancer 03/15/2018   Positive ANA (antinuclear antibody) 03/15/2018   Screening for colon cancer 03/15/2018   Rash 03/15/2018   Achilles tendon pain 11/13/2017   Cervical myelopathy with cervical radiculopathy (HCC) 11/13/2017   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   Allergic rhinitis 06/19/2017   Edema 12/24/2015   Carpal tunnel syndrome, right 12/16/2015   Hypercalcemia 11/11/2015   Pain in both upper extremities 11/11/2015   Drug reaction 10/28/2015   Muscle spasms of both lower extremities 10/28/2015   Nerve pain 10/28/2015   Chronic asthmatic bronchitis with acute exacerbation (HCC) 09/06/2015   Environmental and seasonal allergies 07/17/2014    PCP: Corwin Levins MD  REFERRING PROVIDER: Kathryne Hitch, MD  REFERRING DIAG:  Z98.890 (ICD-10-CM) - Status post arthroscopic surgery of left knee M76.60 (ICD-10-CM) - Achilles tendon pain  THERAPY DIAG:  Chronic pain of left knee  Pain in left lower leg  Muscle weakness (generalized)  Localized edema  Rationale for Evaluation and Treatment: Rehabilitation  ONSET DATE: Surgery 12/21/2022 on Lt knee  SUBJECTIVE:   SUBJECTIVE STATEMENT: "Not too bad today"  Pt indicated doing a lot of walking this weekend with pain increase in both ankle and knee afterward. Reported  achy in both knees.  Pt indicated long car ride as well that didn't help.   PERTINENT HISTORY: History of 05/2022 lumbar surgery, GERD, COPD, HTN, history of arthritis  PAIN:  NPRS scale: arrival Lt knee: 4/10    Lt ankle:  6/10 Pain location: Lt knee, Lt achilles Pain description: constant, throbbing,  Aggravating factors: household cleaning, WB, stairs Relieving factors: nothing specific   PRECAUTIONS: None  WEIGHT BEARING RESTRICTIONS: No  FALLS:  Has patient fallen in last 6 months? 1 fall without medical attention.   LIVING ENVIRONMENT: Lives in: House/apartment Stairs: multistory with bedroom upstairs but has moved bed downstairs.   Rail on Lt side going up.   OCCUPATION: Retired   PLOF: Independent, walking for exercise (5-6 miles/day in the past until 2012).  Dog walking   PATIENT GOALS: Reduce pain, move better, walk better without cane.   OBJECTIVE: (objective measures completed at initial evaluation unless otherwise dated)   PATIENT SURVEYS:  04/30/2023: FOTO update:   40.2  03/26/2023:  FOTO update:  44  02/16/2023 FOTO intake:  38  predicted:  54  COGNITION: 02/16/2023 Overall cognitive status: WFL    SENSATION: 02/16/2023 No specific check today  EDEMA:  02/16/2023 Lt leg edema noted from knee down compared to Lt.   MUSCLE LENGTH: 02/16/2023 No specific testing  POSTURE:  02/16/2023 Reduced lumbar lordosis in standing, Weight shift off Lt leg to Rt.   PALPATION: 02/16/2023 Tenderness around swollen Lt ankle, Lt knee to light touch.   LOWER EXTREMITY ROM:   ROM Right 02/16/2023 Left 02/16/2023 Left 02/21/2023 Left 03/02/2023 Left 03/26/2023 Left 04/30/2023  Hip flexion        Hip extension        Hip abduction        Hip adduction        Hip internal rotation        Hip external rotation        Knee flexion 125 92 AROM in supine heel slide c pain PROM 95/100 grossly assesed in sitting.  90 AROM in supine heel slide c pain 96 AROM in supine heel slide  c pain 98 AROM in heel slide  104 AAROM in heel slide   Knee extension 0 0 AROM in quad set supine  0 AROM in seated quad set  0 AROM in supine  Ankle dorsiflexion      0  Ankle plantarflexion      45  Ankle inversion        Ankle eversion         (Blank rows = not tested)  LOWER EXTREMITY MMT:  MMT Right 02/16/2023 Left 02/16/2023 Left 03/13/2023 Right 03/26/2023 Left 03/26/2023 Left 04/10/2023 Left 04/30/2023  Hip flexion 5/5 4/5     4+/5  Hip extension         Hip abduction         Hip adduction         Hip internal rotation  Hip external rotation         Knee flexion 5/5 4/5 4/5  4+/5    Knee extension 4/5 3+/5 4-/5 5/5 52.9, 52 lbs  4-/5  15.9, 16.5 lbs with pain  4/5 21.4, 25.9 lbs  Ankle dorsiflexion 5/5 3+/5 c pain 4/5  4+/5 5/5   Ankle plantarflexion  2+/5 c pain (unable to perform standing PF) 2+/5 c pain (unable to perform standing PF)  2+/5  3/5 (1 rep in standing single leg PF ,pain noted)  Ankle inversion 5/5 3+/5 c pain 4/5 c pain  4+/5    Ankle eversion 5/5 3+/5 c pain 3+/5 c pain  4/5     (Blank rows = not tested)  LOWER EXTREMITY SPECIAL TESTS:  02/16/2023 No specific testing today.   FUNCTIONAL TESTS:  04/30/2023: Rt SLS 6 seconds Lt SLS 5 seconds  TUG independent:  17.76 seconds  04/16/2023:   Lt SLS: 4 seconds Rt SLS:   7 seconds  03/26/2023:  18 inch chair transfer: 2 tries required  02/16/2023 18 inch chair transfer: multiple tries required c noted deviation to Rt leg Lt SLS:  < 3 seconds Rt SLS: < 3 seconds  GAIT: 02/16/2023 Mod independent ambulation c SPC in Rt UE, decreased stance on Lt leg, decreased toe off progression noted.                                                                                                                                                                         TODAY'S TREATMENT                                                                             DATE:  05/09/2023 Therapeutic Exercise: Slantboard  gastroc stretch 30 sec X 3 Supine elevated Lt ankle DF, inversion, eversion, PF Green band x 15 slow eccentric focus  TherActivity Nustep  lvl 6 15 mins UE/LE to improve tolerance to aerobic exercise and walking in less WB pressure.  Step up /reverse step down 4 inch step x 15 bilateral with   Neuro Re-ed Tandem ambulation on foam 6 ft in // bars with occasional HHA fwd/back x 5 each way  Lateral stepping on foam in // bars with occasional HHA 5 ft x 3 each way  Modalities: Vaso Lt ankle, medium compression 34 deg , supine w/ LE elevation   TODAY'S TREATMENT  DATE:  04/30/2023 Therapeutic Exercise: Nustep  lvl 6 12 mins UE/LE Slantboard gastroc stretch 30 sec X 3 TUG x 1 Supine Lt knee heel slide 5 sec hold c combo to quad set c DF 5 sec hold  x 10 Supine 90 deg hip flexion LAQ c strap holding leg 2 x 10 Lt leg  Neuro Re-ed SLS testing trials x 2 bilaterally Tandem stance 1 min x 2 bilateral with occasional to moderate HHA on bars  Modalities: Vaso Lt ankle, medium compression 34 deg , supine w/ LE elevation  TODAY'S TREATMENT                                                                             DATE: 04/27/23 Pt seen for aquatic therapy today.  Treatment took place in water 3.5-4.75 ft in depth at the [facility] pool. Temp of water was 91.  Pt entered/exited the pool via stairs with hand rail.   Pt requires the buoyancy and hydrostatic pressure of water for support, and to offload joints by unweighting joint load by at least 50 % in navel deep water and by at least 75-80% in chest to neck deep water.  Viscosity of the water is needed for resistance of strengthening. Water current perturbations provides challenge to standing balance requiring increased core activation. Aquatic PT exercises Sidestepping length of pool 3 round trips with mini squat Forward walking  3 round  trips backward walking 3 round trips March walking 3 round trips with water dumbells Tandem walk 3 round trips with water dumbells Leg swings hip abd/add X 20 bilat with UE support with UE support Leg swings hip flexion/extension X 20 bilat Gastroc stretch 30 sec X 3 off of first step Step ups onto first step of pool X10 bilat with UE support Heel raises 2X10 with UE support off of first step Squats X15 with UE support off of first step Hip circles CW and CCW X 15 each bilat 1/4 squat with shoulder extension X20 with kickboard 1/4 lunge with push/pull kickboard 2X15 Lunge step to pool wall, touch wall, then step back, X 15 bilat Monster walks 3 round trips with dumbells Seated on pool chair and cycling 3 min  04/24/2023 Therapeutic Exercise: Nustep  lvl 6 12 mins UE/LE Slantboard gastroc stretch 30 sec X 3 Standing heel and toe raises 2X10 Seated SLR with DF hold 2 x 10 performed  Seated LAQ with end range pauses each direction 2 x 15, performed bilaterally    Neuro Re-ed Tandem walking down forward and back backwards 5 trips in bars with intermittent UE support PRN SLS with Stepping over 6 inch hurdle front to back X 10 each leg  Modalities: Vaso Lt knee/ankle combo, medium compression 34 deg , supine w/ LE elevation   PATIENT EDUCATION:  03/26/2023  Education details: HEP update Person educated: Patient Education method: Programmer, multimedia, Demonstration, Verbal cues, and Handouts Education comprehension: verbalized understanding, returned demonstration, and verbal cues required  HOME EXERCISE PROGRAM: Access Code: [email or web address] URL: [email or web address] Date: 03/26/2023 Prepared by: Chyrel Masson  Exercises - Supine Heel Slide (Mirrored)  - 3-5 x daily - 7 x weekly - 1-2 sets - 10 reps - 2 hold - Seated  Long Arc Quad (Mirrored)  - 3-5 x daily - 7 x weekly - 1-2 sets - 10 reps - 2 hold - Seated Gastroc Stretch with Strap (Mirrored)  - 2-3 x daily - 7 x  weekly - 1 sets - 3 reps - 30 hold - Seated Quad Set (Mirrored)  - 3-5 x daily - 7 x weekly - 1 sets - 10 reps - 5 hold - Seated Straight Leg Heel Taps  - 1-2 x daily - 7 x weekly - 2-3 sets - 10-15 reps - 2-3 hold - Long Sitting Eccentric Ankle Plantar Flexion with Resistance  - 1-2 x daily - 7 x weekly - 3 sets - 10 reps - Long Sitting Ankle Eversion with Resistance  - 1 x daily - 7 x weekly - 1-2 sets - 10-15 reps - Long Sitting Ankle Inversion with Resistance  - 1 x daily - 7 x weekly - 1-2 sets - 10-15 reps - Seated Ankle Dorsiflexion with Resistance  - 1 x daily - 7 x weekly - 1-2 sets - 10-15 reps  ASSESSMENT:  CLINICAL IMPRESSION: Reduce step height performed fair to good with WB with fatigue noted which led to some aggravation of symptoms in knee and ankle at times.   Medical necessity for continued skilled PT services at this time to improve functional strength/WB tolerance.    OBJECTIVE IMPAIRMENTS: Abnormal gait, decreased activity tolerance, decreased balance, decreased coordination, decreased endurance, decreased mobility, difficulty walking, decreased ROM, decreased strength, hypomobility, increased edema, impaired perceived functional ability, impaired flexibility, improper body mechanics, postural dysfunction, and pain.   ACTIVITY LIMITATIONS: carrying, lifting, bending, sitting, standing, squatting, sleeping, stairs, transfers, bed mobility, bathing, toileting, dressing, and locomotion level  PARTICIPATION LIMITATIONS: meal prep, cleaning, laundry, interpersonal relationship, driving, shopping, and community activity  PERSONAL FACTORS:  History of 05/2022 lumbar surgery, GERD, COPD, HTN, history of arthritis, multiple treatment areas current  are also affecting patient's functional outcome.   REHAB POTENTIAL: Fair to good  CLINICAL DECISION MAKING: Evolving/moderate complexity  EVALUATION COMPLEXITY: Moderate   GOALS: Goals reviewed with patient? Yes  SHORT TERM  GOALS: (target date for Short term goals are 3 weeks 03/09/2023)   1.  Patient will demonstrate independent use of home exercise program to maintain progress from in clinic treatments.  Goal status: Met   LONG TERM GOALS: (target dates for all long term goals are 10 weeks  06/11/2023 )   1. Patient will demonstrate/report pain at worst less than or equal to 2/10 to facilitate minimal limitation in daily activity secondary to pain symptoms.  Goal status: revised date    2. Patient will demonstrate independent use of home exercise program to facilitate ability to maintain/progress functional gains from skilled physical therapy services.  Goal status: revised date    3. Patient will demonstrate FOTO outcome > or = 54 % to indicate reduced disability due to condition.  Goal status:revised date    4.  Patient will demonstrate Lt LE MMT 5/5 throughout to faciltiate usual transfers, stairs, squatting at [facility] for daily life.   Goal status: revised date    5.  Patient will demonstrate ability to ascend/descend stairs reciprocal gait pattern with single hand rail assist.  Goal status: revised date    6.  Patient will demonstrate Lt knee AROM 0-110 deg to facilitate ability to perform transfers, ambulation and other activity in daily life.  Goal status: revised date/ROM    7.  Patient will demonstrate ability to ambulation community distances >  500 ft.  Goal Status: revised date  8. Patient will demonstrate TUG independent or with cane < 14 seconds.   Goal status: new goal   PLAN:  PT FREQUENCY: 1-2x/week  PT DURATION: 6 weeks  (added 2 weeks for additional time)  PLANNED INTERVENTIONS: Therapeutic exercises, Therapeutic activity, Neuro Muscular re-education, Balance training, Gait training, Patient/Family education, Joint mobilization, Stair training, DME instructions, Dry Needling, Electrical stimulation, Traction, Cryotherapy, vasopneumatic deviceMoist heat, Taping, Ultrasound,  Ionotophoresis 4mg /ml Dexamethasone, and aquatic therapy, Manual therapy.  All included unless contraindicated  PLAN FOR NEXT SESSION: Aquatic therapy in future again.  Progressive WB/functional movement improvements.   Chyrel Masson, PT, DPT, OCS, ATC 05/09/23  12:25 PM

## 2023-05-11 ENCOUNTER — Encounter: Payer: Self-pay | Admitting: Rehabilitative and Restorative Service Providers"

## 2023-05-11 ENCOUNTER — Ambulatory Visit (INDEPENDENT_AMBULATORY_CARE_PROVIDER_SITE_OTHER): Payer: Medicare Other | Admitting: Rehabilitative and Restorative Service Providers"

## 2023-05-11 DIAGNOSIS — M6281 Muscle weakness (generalized): Secondary | ICD-10-CM

## 2023-05-11 DIAGNOSIS — M25562 Pain in left knee: Secondary | ICD-10-CM | POA: Diagnosis not present

## 2023-05-11 DIAGNOSIS — R600 Localized edema: Secondary | ICD-10-CM | POA: Diagnosis not present

## 2023-05-11 DIAGNOSIS — M79662 Pain in left lower leg: Secondary | ICD-10-CM

## 2023-05-11 DIAGNOSIS — R262 Difficulty in walking, not elsewhere classified: Secondary | ICD-10-CM

## 2023-05-11 DIAGNOSIS — G8929 Other chronic pain: Secondary | ICD-10-CM

## 2023-05-11 NOTE — Therapy (Signed)
OUTPATIENT PHYSICAL THERAPY TREATMENT  Patient Name: MARLII FROST MRN: [id number] DOB:September 24, 1955, 67 y.o., female Today's Date: 05/11/2023  END OF SESSION:  PT End of Session - 05/11/23 4098     Visit Number 21    Number of Visits 29    Date for PT Re-Evaluation 06/11/23    Authorization Type MCR    Authorization Time Period KX required past 15 visits    Progress Note Due on Visit 29    PT Start Time 0924    PT Stop Time 1014    PT Time Calculation (min) 50 min    Activity Tolerance Patient tolerated treatment well    Behavior During Therapy WFL for tasks assessed/performed                 Past Medical History:  Diagnosis Date   Arthritis    Asthma    COPD (chronic obstructive pulmonary disease) (HCC)    GERD (gastroesophageal reflux disease)    Hypertension    Pneumonia    Past Surgical History:  Procedure Laterality Date   ABDOMINAL HYSTERECTOMY     APPENDECTOMY     HERNIA REPAIR     Umbilical x 3   KNEE ARTHROSCOPY W/ MENISCAL REPAIR Left 12/2022   TONSILLECTOMY     TRANSFORAMINAL LUMBAR INTERBODY FUSION (TLIF) WITH PEDICLE SCREW FIXATION 1 LEVEL N/A 05/18/2022   Procedure: Lumbar Four-Five Open Laminectomy/Transforaminal Lumbar Interbody Fusion/Posterolateral fusion;  Surgeon: Jadene Pierini, MD;  Location: [facility] OR;  Service: Neurosurgery;  Laterality: N/A;   Patient Active Problem List   Diagnosis Date Noted   Microhematuria 04/09/2023   Bilateral shoulder pain 04/09/2023   Urethritis 10/05/2022   External otitis of left ear 10/05/2022   Spondylolisthesis of lumbar region 05/18/2022   Smoker 04/08/2022   Deviated nasal septum 04/08/2022   Bilateral pain of leg and foot 04/08/2022   Follicular acne 04/06/2022   Baker cyst, left 04/06/2022   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   Depression 10/04/2021   Left knee pain 10/04/2021   Chronic pain 04/09/2021   Pelvic pain 04/09/2021   HLD (hyperlipidemia)  04/09/2021   Vitamin D deficiency 04/09/2021   Estrogen deficiency 04/09/2021   Hyperglycemia 04/01/2021   Chronic cough 12/30/2020   GERD (gastroesophageal reflux disease) 11/06/2019   Bilateral leg edema 10/16/2019   Pleuritic chest pain 10/16/2019   Hypertension 10/02/2019   Arthralgia 06/28/2018   Sinusitis 06/28/2018   Renal cyst 05/03/2018   Syncope 05/03/2018   Stress and adjustment reaction 05/03/2018   History of tobacco use 05/02/2018   Primary osteoarthritis of both hands 04/10/2018   Primary osteoarthritis of both knees 04/10/2018   Primary osteoarthritis of both feet 04/10/2018   Achilles tendinitis 04/02/2018   Screening for breast cancer 03/15/2018   Positive ANA (antinuclear antibody) 03/15/2018   Screening for colon cancer 03/15/2018   Rash 03/15/2018   Achilles tendon pain 11/13/2017   Cervical myelopathy with cervical radiculopathy (HCC) 11/13/2017   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   Allergic rhinitis 06/19/2017   Edema 12/24/2015   Carpal tunnel syndrome, right 12/16/2015   Hypercalcemia 11/11/2015   Pain in both upper extremities 11/11/2015   Drug reaction 10/28/2015   Muscle spasms of both lower extremities 10/28/2015   Nerve pain 10/28/2015   Chronic asthmatic bronchitis with acute exacerbation (HCC) 09/06/2015   Environmental and seasonal allergies 07/17/2014    PCP: Corwin Levins MD  REFERRING PROVIDER: Kathryne Hitch, MD  REFERRING  DIAG: Z61.096 (ICD-10-CM) - Status post arthroscopic surgery of left knee M76.60 (ICD-10-CM) - Achilles tendon pain  THERAPY DIAG:  Chronic pain of left knee  Pain in left lower leg  Muscle weakness (generalized)  Localized edema  Difficulty in walking, not elsewhere classified  Rationale for Evaluation and Treatment: Rehabilitation  ONSET DATE: Surgery 12/21/2022 on Lt knee  SUBJECTIVE:   SUBJECTIVE STATEMENT: Pt indicated feeling today was a better today so far.  No specific compliations.    PERTINENT HISTORY: History of 05/2022 lumbar surgery, GERD, COPD, HTN, history of arthritis  PAIN:  NPRS scale: arrival Lt knee: 4/10    Lt ankle:  6/10 Pain location: Lt knee, Lt achilles Pain description: constant, throbbing,  Aggravating factors: household cleaning, WB, stairs Relieving factors: nothing specific   PRECAUTIONS: None  WEIGHT BEARING RESTRICTIONS: No  FALLS:  Has patient fallen in last 6 months? 1 fall without medical attention.   LIVING ENVIRONMENT: Lives in: House/apartment Stairs: multistory with bedroom upstairs but has moved bed downstairs.   Rail on Lt side going up.   OCCUPATION: Retired   PLOF: Independent, walking for exercise (5-6 miles/day in the past until 2012).  Dog walking   PATIENT GOALS: Reduce pain, move better, walk better without cane.   OBJECTIVE: (objective measures completed at initial evaluation unless otherwise dated)   PATIENT SURVEYS:  04/30/2023: FOTO update:   40.2  03/26/2023:  FOTO update:  44  02/16/2023 FOTO intake:  38  predicted:  54  COGNITION: 02/16/2023 Overall cognitive status: WFL    SENSATION: 02/16/2023 No specific check today  EDEMA:  02/16/2023 Lt leg edema noted from knee down compared to Lt.   MUSCLE LENGTH: 02/16/2023 No specific testing  POSTURE:  02/16/2023 Reduced lumbar lordosis in standing, Weight shift off Lt leg to Rt.   PALPATION: 02/16/2023 Tenderness around swollen Lt ankle, Lt knee to light touch.   LOWER EXTREMITY ROM:   ROM Right 02/16/2023 Left 02/16/2023 Left 02/21/2023 Left 03/02/2023 Left 03/26/2023 Left 04/30/2023  Hip flexion        Hip extension        Hip abduction        Hip adduction        Hip internal rotation        Hip external rotation        Knee flexion 125 92 AROM in supine heel slide c pain PROM 95/100 grossly assesed in sitting.  90 AROM in supine heel slide c pain 96 AROM in supine heel slide c pain 98 AROM in heel slide  104 AAROM in heel slide   Knee extension  0 0 AROM in quad set supine  0 AROM in seated quad set  0 AROM in supine  Ankle dorsiflexion      0  Ankle plantarflexion      45  Ankle inversion        Ankle eversion         (Blank rows = not tested)  LOWER EXTREMITY MMT:  MMT Right 02/16/2023 Left 02/16/2023 Left 03/13/2023 Right 03/26/2023 Left 03/26/2023 Left 04/10/2023 Left 04/30/2023  Hip flexion 5/5 4/5     4+/5  Hip extension         Hip abduction         Hip adduction         Hip internal rotation         Hip external rotation         Knee flexion 5/5 4/5  4/5  4+/5    Knee extension 4/5 3+/5 4-/5 5/5 52.9, 52 lbs  4-/5  15.9, 16.5 lbs with pain  4/5 21.4, 25.9 lbs  Ankle dorsiflexion 5/5 3+/5 c pain 4/5  4+/5 5/5   Ankle plantarflexion  2+/5 c pain (unable to perform standing PF) 2+/5 c pain (unable to perform standing PF)  2+/5  3/5 (1 rep in standing single leg PF ,pain noted)  Ankle inversion 5/5 3+/5 c pain 4/5 c pain  4+/5    Ankle eversion 5/5 3+/5 c pain 3+/5 c pain  4/5     (Blank rows = not tested)  LOWER EXTREMITY SPECIAL TESTS:  02/16/2023 No specific testing today.   FUNCTIONAL TESTS:  05/11/2023: TUG independent:  15.41, 17.3 seconds   04/30/2023: Rt SLS 6 seconds Lt SLS 5 seconds  TUG independent:  17.76 seconds  04/16/2023:   Lt SLS: 4 seconds Rt SLS:   7 seconds  03/26/2023:  18 inch chair transfer: 2 tries required  02/16/2023 18 inch chair transfer: multiple tries required c noted deviation to Rt leg Lt SLS:  < 3 seconds Rt SLS: < 3 seconds  GAIT: 02/16/2023 Mod independent ambulation c SPC in Rt UE, decreased stance on Lt leg, decreased toe off progression noted.                                                                                                                                                                         TODAY'S TREATMENT                                                                             DATE:  05/11/2023 Therapeutic Exercise: Slantboard gastroc stretch 30  sec X 3 Alternating heel/toe lifts with hands on rail x 15 each way Supine elevated Lt ankle DF, inversion, eversion, PF Green band x 20 slow eccentric focus Seated Lt leg LAQ 4 lbs 2 x 15  TherActivity Nustep  lvl 6 12 mins UE/LE to improve tolerance to aerobic exercise and walking in less WB pressure.  Step up /reverse step down 4 inch step x 20 bilateral with single hand rail assist   Modalities: Vaso Lt ankle, medium compression 34 deg , supine w/ LE elevation  TODAY'S TREATMENT  DATE:  05/09/2023 Therapeutic Exercise: Slantboard gastroc stretch 30 sec X 3 Supine elevated Lt ankle DF, inversion, eversion, PF Green band x 15 slow eccentric focus  TherActivity Nustep  lvl 6 15 mins UE/LE to improve tolerance to aerobic exercise and walking in less WB pressure.  Step up /reverse step down 4 inch step x 15 bilateral with   Neuro Re-ed Tandem ambulation on foam 6 ft in // bars with occasional HHA fwd/back x 5 each way  Lateral stepping on foam in // bars with occasional HHA 5 ft x 3 each way  Modalities: Vaso Lt ankle, medium compression 34 deg , supine w/ LE elevation   TODAY'S TREATMENT                                                                             DATE:  04/30/2023 Therapeutic Exercise: Nustep  lvl 6 12 mins UE/LE Slantboard gastroc stretch 30 sec X 3 TUG x 1 Supine Lt knee heel slide 5 sec hold c combo to quad set c DF 5 sec hold  x 10 Supine 90 deg hip flexion LAQ c strap holding leg 2 x 10 Lt leg  Neuro Re-ed SLS testing trials x 2 bilaterally Tandem stance 1 min x 2 bilateral with occasional to moderate HHA on bars  Modalities: Vaso Lt ankle, medium compression 34 deg , supine w/ LE elevation  TODAY'S TREATMENT                                                                             DATE: 04/27/23 Pt seen for aquatic therapy today.  Treatment took place in water  3.5-4.75 ft in depth at the [facility] pool. Temp of water was 91.  Pt entered/exited the pool via stairs with hand rail.   Pt requires the buoyancy and hydrostatic pressure of water for support, and to offload joints by unweighting joint load by at least 50 % in navel deep water and by at least 75-80% in chest to neck deep water.  Viscosity of the water is needed for resistance of strengthening. Water current perturbations provides challenge to standing balance requiring increased core activation. Aquatic PT exercises Sidestepping length of pool 3 round trips with mini squat Forward walking  3 round trips backward walking 3 round trips March walking 3 round trips with water dumbells Tandem walk 3 round trips with water dumbells Leg swings hip abd/add X 20 bilat with UE support with UE support Leg swings hip flexion/extension X 20 bilat Gastroc stretch 30 sec X 3 off of first step Step ups onto first step of pool X10 bilat with UE support Heel raises 2X10 with UE support off of first step Squats X15 with UE support off of first step Hip circles CW and CCW X 15 each bilat 1/4 squat with shoulder extension X20 with kickboard 1/4 lunge with push/pull kickboard 2X15 Lunge step to pool wall, touch  wall, then step back, X 15 bilat Monster walks 3 round trips with dumbells Seated on pool chair and cycling 3 min    PATIENT EDUCATION:  03/26/2023  Education details: HEP update Person educated: Patient Education method: Programmer, multimedia, Demonstration, Verbal cues, and Handouts Education comprehension: verbalized understanding, returned demonstration, and verbal cues required  HOME EXERCISE PROGRAM: Access Code: [email or web address] URL: [email or web address] Date: 03/26/2023 Prepared by: Chyrel Masson  Exercises - Supine Heel Slide (Mirrored)  - 3-5 x daily - 7 x weekly - 1-2 sets - 10 reps - 2 hold - Seated Long Arc Quad (Mirrored)  - 3-5 x daily - 7 x weekly - 1-2 sets - 10  reps - 2 hold - Seated Gastroc Stretch with Strap (Mirrored)  - 2-3 x daily - 7 x weekly - 1 sets - 3 reps - 30 hold - Seated Quad Set (Mirrored)  - 3-5 x daily - 7 x weekly - 1 sets - 10 reps - 5 hold - Seated Straight Leg Heel Taps  - 1-2 x daily - 7 x weekly - 2-3 sets - 10-15 reps - 2-3 hold - Long Sitting Eccentric Ankle Plantar Flexion with Resistance  - 1-2 x daily - 7 x weekly - 3 sets - 10 reps - Long Sitting Ankle Eversion with Resistance  - 1 x daily - 7 x weekly - 1-2 sets - 10-15 reps - Long Sitting Ankle Inversion with Resistance  - 1 x daily - 7 x weekly - 1-2 sets - 10-15 reps - Seated Ankle Dorsiflexion with Resistance  - 1 x daily - 7 x weekly - 1-2 sets - 10-15 reps  ASSESSMENT:  CLINICAL IMPRESSION: TUG reassessment showed ability to perform quicker than previous assessment by approx. 2 seconds.  Still above 14 seconds fall risk level despite improvements noted.  WB activity for knee and ankle still can impact and increase pain complaints with repetitive action.   Medical necessity for continued skilled PT services at this time to improve functional strength/WB tolerance.    OBJECTIVE IMPAIRMENTS: Abnormal gait, decreased activity tolerance, decreased balance, decreased coordination, decreased endurance, decreased mobility, difficulty walking, decreased ROM, decreased strength, hypomobility, increased edema, impaired perceived functional ability, impaired flexibility, improper body mechanics, postural dysfunction, and pain.   ACTIVITY LIMITATIONS: carrying, lifting, bending, sitting, standing, squatting, sleeping, stairs, transfers, bed mobility, bathing, toileting, dressing, and locomotion level  PARTICIPATION LIMITATIONS: meal prep, cleaning, laundry, interpersonal relationship, driving, shopping, and community activity  PERSONAL FACTORS:  History of 05/2022 lumbar surgery, GERD, COPD, HTN, history of arthritis, multiple treatment areas current  are also affecting  patient's functional outcome.   REHAB POTENTIAL: Fair to good  CLINICAL DECISION MAKING: Evolving/moderate complexity  EVALUATION COMPLEXITY: Moderate   GOALS: Goals reviewed with patient? Yes  SHORT TERM GOALS: (target date for Short term goals are 3 weeks 03/09/2023)   1.  Patient will demonstrate independent use of home exercise program to maintain progress from in clinic treatments.  Goal status: Met   LONG TERM GOALS: (target dates for all long term goals are 10 weeks  06/11/2023 )   1. Patient will demonstrate/report pain at worst less than or equal to 2/10 to facilitate minimal limitation in daily activity secondary to pain symptoms.  Goal status: on going 05/11/2023   2. Patient will demonstrate independent use of home exercise program to facilitate ability to maintain/progress functional gains from skilled physical therapy services.  Goal status:  on going 05/11/2023   3. Patient will demonstrate FOTO outcome >  or = 54 % to indicate reduced disability due to condition.  Goal status:on going 05/11/2023   4.  Patient will demonstrate Lt LE MMT 5/5 throughout to faciltiate usual transfers, stairs, squatting at [facility] for daily life.   Goal status: on going 05/11/2023   5.  Patient will demonstrate ability to ascend/descend stairs reciprocal gait pattern with single hand rail assist.  Goal status: on going 05/11/2023   6.  Patient will demonstrate Lt knee AROM 0-110 deg to facilitate ability to perform transfers, ambulation and other activity in daily life.  Goal status: on going 05/11/2023   7.  Patient will demonstrate ability to ambulation community distances > 500 ft.  Goal Status: on going 05/11/2023  8. Patient will demonstrate TUG independent or with cane < 14 seconds.   Goal status: on going 05/11/2023   PLAN:  PT FREQUENCY: 1-2x/week  PT DURATION: 6 weeks  (added 2 weeks for additional time)  PLANNED INTERVENTIONS: Therapeutic exercises, Therapeutic  activity, Neuro Muscular re-education, Balance training, Gait training, Patient/Family education, Joint mobilization, Stair training, DME instructions, Dry Needling, Electrical stimulation, Traction, Cryotherapy, vasopneumatic deviceMoist heat, Taping, Ultrasound, Ionotophoresis 4mg /ml Dexamethasone, and aquatic therapy, Manual therapy.  All included unless contraindicated  PLAN FOR NEXT SESSION: WB strengthening as tolerated.  Recheck ROM  Chyrel Masson, PT, DPT, OCS, ATC 05/11/23  10:02 AM

## 2023-05-16 ENCOUNTER — Ambulatory Visit
Admission: RE | Admit: 2023-05-16 | Discharge: 2023-05-16 | Disposition: A | Discharge status: Home / Self Care | Payer: Medicare Other | Source: Ambulatory Visit | Attending: Urology | Admitting: Urology

## 2023-05-16 DIAGNOSIS — R310 Gross hematuria: Secondary | ICD-10-CM

## 2023-05-16 MED ORDER — IOPAMIDOL (ISOVUE-300) INJECTION 61%
500.0000 mL | Freq: Once | INTRAVENOUS | Status: AC | PRN
  Administered 2023-05-16: 120 mL via INTRAVENOUS

## 2023-05-17 ENCOUNTER — Encounter: Payer: Self-pay | Admitting: Rehabilitative and Restorative Service Providers"

## 2023-05-17 ENCOUNTER — Ambulatory Visit (INDEPENDENT_AMBULATORY_CARE_PROVIDER_SITE_OTHER): Payer: Medicare Other | Admitting: Rehabilitative and Restorative Service Providers"

## 2023-05-17 DIAGNOSIS — G8929 Other chronic pain: Secondary | ICD-10-CM

## 2023-05-17 DIAGNOSIS — M6281 Muscle weakness (generalized): Secondary | ICD-10-CM

## 2023-05-17 DIAGNOSIS — R600 Localized edema: Secondary | ICD-10-CM | POA: Diagnosis not present

## 2023-05-17 DIAGNOSIS — M25562 Pain in left knee: Secondary | ICD-10-CM

## 2023-05-17 DIAGNOSIS — M79662 Pain in left lower leg: Secondary | ICD-10-CM | POA: Diagnosis not present

## 2023-05-17 DIAGNOSIS — R262 Difficulty in walking, not elsewhere classified: Secondary | ICD-10-CM

## 2023-05-17 NOTE — Therapy (Signed)
OUTPATIENT PHYSICAL THERAPY TREATMENT  Patient Name: Frances Nelson MRN: [id number] DOB:[birth year], 67 y.o., female Today's Date: 05/17/2023  END OF SESSION:  PT End of Session - 05/17/23 1048     Visit Number 22    Number of Visits 29    Date for PT Re-Evaluation 06/11/23    Authorization Type MCR    Authorization Time Period KX required past 15 visits    Progress Note Due on Visit 29    PT Start Time 1048    PT Stop Time 1138    PT Time Calculation (min) 50 min    Activity Tolerance Patient tolerated treatment well    Behavior During Therapy WFL for tasks assessed/performed                  Past Medical History:  Diagnosis Date   Arthritis    Asthma    COPD (chronic obstructive pulmonary disease) (HCC)    GERD (gastroesophageal reflux disease)    Hypertension    Pneumonia    Past Surgical History:  Procedure Laterality Date   ABDOMINAL HYSTERECTOMY     APPENDECTOMY     HERNIA REPAIR     Umbilical x 3   KNEE ARTHROSCOPY W/ MENISCAL REPAIR Left 12/2022   TONSILLECTOMY     TRANSFORAMINAL LUMBAR INTERBODY FUSION (TLIF) WITH PEDICLE SCREW FIXATION 1 LEVEL N/A 05/18/2022   Procedure: Lumbar Four-Five Open Laminectomy/Transforaminal Lumbar Interbody Fusion/Posterolateral fusion;  Surgeon: Jadene Pierini, MD;  Location: [facility] OR;  Service: Neurosurgery;  Laterality: N/A;   Patient Active Problem List   Diagnosis Date Noted   Microhematuria 04/09/2023   Bilateral shoulder pain 04/09/2023   Urethritis 10/05/2022   External otitis of left ear 10/05/2022   Spondylolisthesis of lumbar region 05/18/2022   Smoker 04/08/2022   Deviated nasal septum 04/08/2022   Bilateral pain of leg and foot 04/08/2022   Follicular acne 04/06/2022   Baker cyst, left 04/06/2022   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   Depression 10/04/2021   Left knee pain 10/04/2021   Chronic pain 04/09/2021   Pelvic pain 04/09/2021   HLD (hyperlipidemia)  04/09/2021   Vitamin D deficiency 04/09/2021   Estrogen deficiency 04/09/2021   Hyperglycemia 04/01/2021   Chronic cough 12/30/2020   GERD (gastroesophageal reflux disease) 11/06/2019   Bilateral leg edema 10/16/2019   Pleuritic chest pain 10/16/2019   Hypertension 10/02/2019   Arthralgia 06/28/2018   Sinusitis 06/28/2018   Renal cyst 05/03/2018   Syncope 05/03/2018   Stress and adjustment reaction 05/03/2018   History of tobacco use 05/02/2018   Primary osteoarthritis of both hands 04/10/2018   Primary osteoarthritis of both knees 04/10/2018   Primary osteoarthritis of both feet 04/10/2018   Achilles tendinitis 04/02/2018   Screening for breast cancer 03/15/2018   Positive ANA (antinuclear antibody) 03/15/2018   Screening for colon cancer 03/15/2018   Rash 03/15/2018   Achilles tendon pain 11/13/2017   Cervical myelopathy with cervical radiculopathy (HCC) 11/13/2017   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   Allergic rhinitis 06/19/2017   Edema 12/24/2015   Carpal tunnel syndrome, right 12/16/2015   Hypercalcemia 11/11/2015   Pain in both upper extremities 11/11/2015   Drug reaction 10/28/2015   Muscle spasms of both lower extremities 10/28/2015   Nerve pain 10/28/2015   Chronic asthmatic bronchitis with acute exacerbation (HCC) 09/06/2015   Environmental and seasonal allergies 07/17/2014    PCP: Corwin Levins MD  REFERRING PROVIDER: Kathryne Hitch, MD  REFERRING DIAG: G40.102 (ICD-10-CM) - Status post arthroscopic surgery of left knee M76.60 (ICD-10-CM) - Achilles tendon pain  THERAPY DIAG:  Chronic pain of left knee  Pain in left lower leg  Muscle weakness (generalized)  Localized edema  Difficulty in walking, not elsewhere classified  Rationale for Evaluation and Treatment: Rehabilitation  ONSET DATE: Surgery 12/21/2022 on Lt knee  SUBJECTIVE:   SUBJECTIVE STATEMENT: Pt indicated not too bad this week.  Reported a CT scan yesterday and reported  lying still hurt back and legs some.   Pt indicated feeling some increase in leg symptoms after last visit with standing stuff.   PERTINENT HISTORY: History of 05/2022 lumbar surgery, GERD, COPD, HTN, history of arthritis  PAIN:  NPRS scale: arrival Lt knee: 4/10    Lt ankle:  5/10 Pain location: Lt knee, Lt achilles Pain description: constant, throbbing,  Aggravating factors: household cleaning, WB, stairs Relieving factors: nothing specific   PRECAUTIONS: None  WEIGHT BEARING RESTRICTIONS: No  FALLS:  Has patient fallen in last 6 months? 1 fall without medical attention.   LIVING ENVIRONMENT: Lives in: House/apartment Stairs: multistory with bedroom upstairs but has moved bed downstairs.   Rail on Lt side going up.   OCCUPATION: Retired   PLOF: Independent, walking for exercise (5-6 miles/day in the past until 2012).  Dog walking   PATIENT GOALS: Reduce pain, move better, walk better without cane.   OBJECTIVE: (objective measures completed at initial evaluation unless otherwise dated)   PATIENT SURVEYS:  04/30/2023: FOTO update:   40.2  03/26/2023:  FOTO update:  44  02/16/2023 FOTO intake:  38  predicted:  54  COGNITION: 02/16/2023 Overall cognitive status: WFL    SENSATION: 02/16/2023 No specific check today  EDEMA:  02/16/2023 Lt leg edema noted from knee down compared to Lt.   MUSCLE LENGTH: 02/16/2023 No specific testing  POSTURE:  02/16/2023 Reduced lumbar lordosis in standing, Weight shift off Lt leg to Rt.   PALPATION: 02/16/2023 Tenderness around swollen Lt ankle, Lt knee to light touch.   LOWER EXTREMITY ROM:   ROM Right 02/16/2023 Left 02/16/2023 Left 02/21/2023 Left 03/02/2023 Left 03/26/2023 Left 04/30/2023 Left 05/17/2023  Hip flexion         Hip extension         Hip abduction         Hip adduction         Hip internal rotation         Hip external rotation         Knee flexion 125 92 AROM in supine heel slide c pain PROM 95/100 grossly assesed  in sitting.  90 AROM in supine heel slide c pain 96 AROM in supine heel slide c pain 98 AROM in heel slide  104 AAROM in heel slide    Knee extension 0 0 AROM in quad set supine  0 AROM in seated quad set  0 AROM in supine   Ankle dorsiflexion      0 5  Ankle plantarflexion      45 39  Ankle inversion         Ankle eversion          (Blank rows = not tested)  LOWER EXTREMITY MMT:  MMT Right 02/16/2023 Left 02/16/2023 Left 03/13/2023 Right 03/26/2023 Left 03/26/2023 Left 04/10/2023 Left 04/30/2023  Hip flexion 5/5 4/5     4+/5  Hip extension         Hip abduction  Hip adduction         Hip internal rotation         Hip external rotation         Knee flexion 5/5 4/5 4/5  4+/5    Knee extension 4/5 3+/5 4-/5 5/5 52.9, 52 lbs  4-/5  15.9, 16.5 lbs with pain  4/5 21.4, 25.9 lbs  Ankle dorsiflexion 5/5 3+/5 c pain 4/5  4+/5 5/5   Ankle plantarflexion  2+/5 c pain (unable to perform standing PF) 2+/5 c pain (unable to perform standing PF)  2+/5  3/5 (1 rep in standing single leg PF ,pain noted)  Ankle inversion 5/5 3+/5 c pain 4/5 c pain  4+/5    Ankle eversion 5/5 3+/5 c pain 3+/5 c pain  4/5     (Blank rows = not tested)  LOWER EXTREMITY SPECIAL TESTS:  02/16/2023 No specific testing today.   FUNCTIONAL TESTS:  05/11/2023: TUG independent:  15.41, 17.3 seconds   04/30/2023: Rt SLS 6 seconds Lt SLS 5 seconds  TUG independent:  17.76 seconds  04/16/2023:   Lt SLS: 4 seconds Rt SLS:   7 seconds  03/26/2023:  18 inch chair transfer: 2 tries required  02/16/2023 18 inch chair transfer: multiple tries required c noted deviation to Rt leg Lt SLS:  < 3 seconds Rt SLS: < 3 seconds  GAIT: 02/16/2023 Mod independent ambulation c SPC in Rt UE, decreased stance on Lt leg, decreased toe off progression noted.                                                                                                                                                                          TODAY'S TREATMENT                                                                             DATE:  05/17/2023 Therapeutic Exercise: Slantboard gastroc stretch 30 sec X 3 Nustep  lvl 6 12 mins UE/LE  Neuro Re-ed Tandem stance on foam 1 min x 2 bilaterally with occasional HHA SLS with contralateral step over and back tap 6 inch hurdle x 15 bilateral with occasional HHA Alternating heel/toe lifting in standing x 20 with occasional HHA  Modalities: Vaso Lt ankle, medium compression 34 deg , supine w/ LE elevation  TODAY'S TREATMENT  DATE:  05/11/2023 Therapeutic Exercise: Slantboard gastroc stretch 30 sec X 3 Alternating heel/toe lifts with hands on rail x 15 each way Supine elevated Lt ankle DF, inversion, eversion, PF Green band x 20 slow eccentric focus Seated Lt leg LAQ 4 lbs 2 x 15  TherActivity Nustep  lvl 6 12 mins UE/LE to improve tolerance to aerobic exercise and walking in less WB pressure.  Step up /reverse step down 4 inch step x 20 bilateral with single hand rail assist   Modalities: Vaso Lt ankle, medium compression 34 deg , supine w/ LE elevation  TODAY'S TREATMENT                                                                             DATE:  05/09/2023 Therapeutic Exercise: Slantboard gastroc stretch 30 sec X 3 Supine elevated Lt ankle DF, inversion, eversion, PF Green band x 15 slow eccentric focus  TherActivity Nustep  lvl 6 15 mins UE/LE to improve tolerance to aerobic exercise and walking in less WB pressure.  Step up /reverse step down 4 inch step x 15 bilateral with   Neuro Re-ed Tandem ambulation on foam 6 ft in // bars with occasional HHA fwd/back x 5 each way  Lateral stepping on foam in // bars with occasional HHA 5 ft x 3 each way  Modalities: Vaso Lt ankle, medium compression 34 deg , supine w/ LE elevation    PATIENT EDUCATION:  03/26/2023   Education details: HEP update Person educated: Patient Education method: Programmer, multimedia, Demonstration, Verbal cues, and Handouts Education comprehension: verbalized understanding, returned demonstration, and verbal cues required  HOME EXERCISE PROGRAM: Access Code: [email or web address] URL: [email or web address] Date: 03/26/2023 Prepared by: Chyrel Masson  Exercises - Supine Heel Slide (Mirrored)  - 3-5 x daily - 7 x weekly - 1-2 sets - 10 reps - 2 hold - Seated Long Arc Quad (Mirrored)  - 3-5 x daily - 7 x weekly - 1-2 sets - 10 reps - 2 hold - Seated Gastroc Stretch with Strap (Mirrored)  - 2-3 x daily - 7 x weekly - 1 sets - 3 reps - 30 hold - Seated Quad Set (Mirrored)  - 3-5 x daily - 7 x weekly - 1 sets - 10 reps - 5 hold - Seated Straight Leg Heel Taps  - 1-2 x daily - 7 x weekly - 2-3 sets - 10-15 reps - 2-3 hold - Long Sitting Eccentric Ankle Plantar Flexion with Resistance  - 1-2 x daily - 7 x weekly - 3 sets - 10 reps - Long Sitting Ankle Eversion with Resistance  - 1 x daily - 7 x weekly - 1-2 sets - 10-15 reps - Long Sitting Ankle Inversion with Resistance  - 1 x daily - 7 x weekly - 1-2 sets - 10-15 reps - Seated Ankle Dorsiflexion with Resistance  - 1 x daily - 7 x weekly - 1-2 sets - 10-15 reps  ASSESSMENT:  CLINICAL IMPRESSION: Ankle ROM DF improved some compared to last assessment which can improve toe off progression in ambulation.   Medical necessity for continued skilled PT services at this time to improve functional strength/WB tolerance to improve stability in ambulation and stair navigation/transfer improvements with  reduced device /HHA reliance.   OBJECTIVE IMPAIRMENTS: Abnormal gait, decreased activity tolerance, decreased balance, decreased coordination, decreased endurance, decreased mobility, difficulty walking, decreased ROM, decreased strength, hypomobility, increased edema, impaired perceived functional ability, impaired flexibility, improper body  mechanics, postural dysfunction, and pain.   ACTIVITY LIMITATIONS: carrying, lifting, bending, sitting, standing, squatting, sleeping, stairs, transfers, bed mobility, bathing, toileting, dressing, and locomotion level  PARTICIPATION LIMITATIONS: meal prep, cleaning, laundry, interpersonal relationship, driving, shopping, and community activity  PERSONAL FACTORS:  History of 05/2022 lumbar surgery, GERD, COPD, HTN, history of arthritis, multiple treatment areas current  are also affecting patient's functional outcome.   REHAB POTENTIAL: Fair to good  CLINICAL DECISION MAKING: Evolving/moderate complexity  EVALUATION COMPLEXITY: Moderate   GOALS: Goals reviewed with patient? Yes  SHORT TERM GOALS: (target date for Short term goals are 3 weeks 03/09/2023)   1.  Patient will demonstrate independent use of home exercise program to maintain progress from in clinic treatments.  Goal status: Met   LONG TERM GOALS: (target dates for all long term goals are 10 weeks  06/11/2023 )   1. Patient will demonstrate/report pain at worst less than or equal to 2/10 to facilitate minimal limitation in daily activity secondary to pain symptoms.  Goal status: on going 05/11/2023   2. Patient will demonstrate independent use of home exercise program to facilitate ability to maintain/progress functional gains from skilled physical therapy services.  Goal status:  on going 05/11/2023   3. Patient will demonstrate FOTO outcome > or = 54 % to indicate reduced disability due to condition.  Goal status:on going 05/11/2023   4.  Patient will demonstrate Lt LE MMT 5/5 throughout to faciltiate usual transfers, stairs, squatting at [facility] for daily life.   Goal status: on going 05/11/2023   5.  Patient will demonstrate ability to ascend/descend stairs reciprocal gait pattern with single hand rail assist.  Goal status: on going 05/11/2023   6.  Patient will demonstrate Lt knee AROM 0-110 deg to facilitate  ability to perform transfers, ambulation and other activity in daily life.  Goal status: on going 05/11/2023   7.  Patient will demonstrate ability to ambulation community distances > 500 ft.  Goal Status: on going 05/11/2023  8. Patient will demonstrate TUG independent or with cane < 14 seconds.   Goal status: on going 05/11/2023   PLAN:  PT FREQUENCY: 1-2x/week  PT DURATION: 6 weeks  (added 2 weeks for additional time)  PLANNED INTERVENTIONS: Therapeutic exercises, Therapeutic activity, Neuro Muscular re-education, Balance training, Gait training, Patient/Family education, Joint mobilization, Stair training, DME instructions, Dry Needling, Electrical stimulation, Traction, Cryotherapy, vasopneumatic deviceMoist heat, Taping, Ultrasound, Ionotophoresis 4mg /ml Dexamethasone, and aquatic therapy, Manual therapy.  All included unless contraindicated  PLAN FOR NEXT SESSION: Aquatic next visit.   Chyrel Masson, PT, DPT, OCS, ATC 05/17/23  11:29 AM

## 2023-05-18 ENCOUNTER — Encounter: Payer: Self-pay | Admitting: Physical Therapy

## 2023-05-18 ENCOUNTER — Ambulatory Visit (INDEPENDENT_AMBULATORY_CARE_PROVIDER_SITE_OTHER): Payer: Medicare Other | Admitting: Physical Therapy

## 2023-05-18 DIAGNOSIS — M25562 Pain in left knee: Secondary | ICD-10-CM

## 2023-05-18 DIAGNOSIS — R600 Localized edema: Secondary | ICD-10-CM

## 2023-05-18 DIAGNOSIS — M79662 Pain in left lower leg: Secondary | ICD-10-CM | POA: Diagnosis not present

## 2023-05-18 DIAGNOSIS — G8929 Other chronic pain: Secondary | ICD-10-CM

## 2023-05-18 DIAGNOSIS — M6281 Muscle weakness (generalized): Secondary | ICD-10-CM | POA: Diagnosis not present

## 2023-05-18 NOTE — Therapy (Signed)
OUTPATIENT PHYSICAL THERAPY TREATMENT  Patient Name: Frances Nelson MRN: [id number] DOB:Jul 22, 1955, 67 y.o., female Today's Date: 05/18/2023  END OF SESSION:  PT End of Session - 05/18/23 0855     Visit Number 23    Number of Visits 29    Date for PT Re-Evaluation 06/11/23    Authorization Type MCR    Authorization Time Period KX required past 15 visits    Progress Note Due on Visit 29    PT Start Time 0845    PT Stop Time 0930    PT Time Calculation (min) 45 min    Activity Tolerance Patient tolerated treatment well    Behavior During Therapy WFL for tasks assessed/performed                  Past Medical History:  Diagnosis Date   Arthritis    Asthma    COPD (chronic obstructive pulmonary disease) (HCC)    GERD (gastroesophageal reflux disease)    Hypertension    Pneumonia    Past Surgical History:  Procedure Laterality Date   ABDOMINAL HYSTERECTOMY     APPENDECTOMY     HERNIA REPAIR     Umbilical x 3   KNEE ARTHROSCOPY W/ MENISCAL REPAIR Left 12/2022   TONSILLECTOMY     TRANSFORAMINAL LUMBAR INTERBODY FUSION (TLIF) WITH PEDICLE SCREW FIXATION 1 LEVEL N/A 05/18/2022   Procedure: Lumbar Four-Five Open Laminectomy/Transforaminal Lumbar Interbody Fusion/Posterolateral fusion;  Surgeon: Jadene Pierini, MD;  Location: [facility] OR;  Service: Neurosurgery;  Laterality: N/A;   Patient Active Problem List   Diagnosis Date Noted   Microhematuria 04/09/2023   Bilateral shoulder pain 04/09/2023   Urethritis 10/05/2022   External otitis of left ear 10/05/2022   Spondylolisthesis of lumbar region 05/18/2022   Smoker 04/08/2022   Deviated nasal septum 04/08/2022   Bilateral pain of leg and foot 04/08/2022   Follicular acne 04/06/2022   Baker cyst, left 04/06/2022   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   Depression 10/04/2021   Left knee pain 10/04/2021   Chronic pain 04/09/2021   Pelvic pain 04/09/2021   HLD (hyperlipidemia)  04/09/2021   Vitamin D deficiency 04/09/2021   Estrogen deficiency 04/09/2021   Hyperglycemia 04/01/2021   Chronic cough 12/30/2020   GERD (gastroesophageal reflux disease) 11/06/2019   Bilateral leg edema 10/16/2019   Pleuritic chest pain 10/16/2019   Hypertension 10/02/2019   Arthralgia 06/28/2018   Sinusitis 06/28/2018   Renal cyst 05/03/2018   Syncope 05/03/2018   Stress and adjustment reaction 05/03/2018   History of tobacco use 05/02/2018   Primary osteoarthritis of both hands 04/10/2018   Primary osteoarthritis of both knees 04/10/2018   Primary osteoarthritis of both feet 04/10/2018   Achilles tendinitis 04/02/2018   Screening for breast cancer 03/15/2018   Positive ANA (antinuclear antibody) 03/15/2018   Screening for colon cancer 03/15/2018   Rash 03/15/2018   Achilles tendon pain 11/13/2017   Cervical myelopathy with cervical radiculopathy (HCC) 11/13/2017   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   Allergic rhinitis 06/19/2017   Edema 12/24/2015   Carpal tunnel syndrome, right 12/16/2015   Hypercalcemia 11/11/2015   Pain in both upper extremities 11/11/2015   Drug reaction 10/28/2015   Muscle spasms of both lower extremities 10/28/2015   Nerve pain 10/28/2015   Chronic asthmatic bronchitis with acute exacerbation (HCC) 09/06/2015   Environmental and seasonal allergies 07/17/2014    PCP: Corwin Levins MD  REFERRING PROVIDER: Kathryne Hitch, MD  REFERRING DIAG: Z61.096 (ICD-10-CM) - Status post arthroscopic surgery of left knee M76.60 (ICD-10-CM) - Achilles tendon pain  THERAPY DIAG:  Chronic pain of left knee  Pain in left lower leg  Muscle weakness (generalized)  Localized edema  Rationale for Evaluation and Treatment: Rehabilitation  ONSET DATE: Surgery 12/21/2022 on Lt knee  SUBJECTIVE:   SUBJECTIVE STATEMENT: Feeling some better overall today but is having some cramping her ankle/foot  PERTINENT HISTORY: History of 05/2022 lumbar  surgery, GERD, COPD, HTN, history of arthritis  PAIN:  NPRS scale: arrival Lt knee: 3/10    Lt ankle:  3/10 Pain location: Lt knee, Lt achilles Pain description: constant, throbbing,  Aggravating factors: household cleaning, WB, stairs Relieving factors: nothing specific   PRECAUTIONS: None  WEIGHT BEARING RESTRICTIONS: No  FALLS:  Has patient fallen in last 6 months? 1 fall without medical attention.   LIVING ENVIRONMENT: Lives in: House/apartment Stairs: multistory with bedroom upstairs but has moved bed downstairs.   Rail on Lt side going up.   OCCUPATION: Retired   PLOF: Independent, walking for exercise (5-6 miles/day in the past until 2012).  Dog walking   PATIENT GOALS: Reduce pain, move better, walk better without cane.   OBJECTIVE: (objective measures completed at initial evaluation unless otherwise dated)   PATIENT SURVEYS:  04/30/2023: FOTO update:   40.2  03/26/2023:  FOTO update:  44  02/16/2023 FOTO intake:  38  predicted:  54  COGNITION: 02/16/2023 Overall cognitive status: WFL    SENSATION: 02/16/2023 No specific check today  EDEMA:  02/16/2023 Lt leg edema noted from knee down compared to Lt.   MUSCLE LENGTH: 02/16/2023 No specific testing  POSTURE:  02/16/2023 Reduced lumbar lordosis in standing, Weight shift off Lt leg to Rt.   PALPATION: 02/16/2023 Tenderness around swollen Lt ankle, Lt knee to light touch.   LOWER EXTREMITY ROM:   ROM Right 02/16/2023 Left 02/16/2023 Left 02/21/2023 Left 03/02/2023 Left 03/26/2023 Left 04/30/2023 Left 05/17/2023  Hip flexion         Hip extension         Hip abduction         Hip adduction         Hip internal rotation         Hip external rotation         Knee flexion 125 92 AROM in supine heel slide c pain PROM 95/100 grossly assesed in sitting.  90 AROM in supine heel slide c pain 96 AROM in supine heel slide c pain 98 AROM in heel slide  104 AAROM in heel slide    Knee extension 0 0 AROM in quad set  supine  0 AROM in seated quad set  0 AROM in supine   Ankle dorsiflexion      0 5  Ankle plantarflexion      45 39  Ankle inversion         Ankle eversion          (Blank rows = not tested)  LOWER EXTREMITY MMT:  MMT Right 02/16/2023 Left 02/16/2023 Left 03/13/2023 Right 03/26/2023 Left 03/26/2023 Left 04/10/2023 Left 04/30/2023  Hip flexion 5/5 4/5     4+/5  Hip extension         Hip abduction         Hip adduction         Hip internal rotation         Hip external rotation  Knee flexion 5/5 4/5 4/5  4+/5    Knee extension 4/5 3+/5 4-/5 5/5 52.9, 52 lbs  4-/5  15.9, 16.5 lbs with pain  4/5 21.4, 25.9 lbs  Ankle dorsiflexion 5/5 3+/5 c pain 4/5  4+/5 5/5   Ankle plantarflexion  2+/5 c pain (unable to perform standing PF) 2+/5 c pain (unable to perform standing PF)  2+/5  3/5 (1 rep in standing single leg PF ,pain noted)  Ankle inversion 5/5 3+/5 c pain 4/5 c pain  4+/5    Ankle eversion 5/5 3+/5 c pain 3+/5 c pain  4/5     (Blank rows = not tested)  LOWER EXTREMITY SPECIAL TESTS:  02/16/2023 No specific testing today.   FUNCTIONAL TESTS:  05/11/2023: TUG independent:  15.41, 17.3 seconds   04/30/2023: Rt SLS 6 seconds Lt SLS 5 seconds  TUG independent:  17.76 seconds  04/16/2023:   Lt SLS: 4 seconds Rt SLS:   7 seconds  03/26/2023:  18 inch chair transfer: 2 tries required  02/16/2023 18 inch chair transfer: multiple tries required c noted deviation to Rt leg Lt SLS:  < 3 seconds Rt SLS: < 3 seconds  GAIT: 02/16/2023 Mod independent ambulation c SPC in Rt UE, decreased stance on Lt leg, decreased toe off progression noted.                                                                                                                                                                         TODAY'S TREATMENT                                                                             DATE:  05/18/23  Pt seen for aquatic therapy today.  Treatment took place in water  3.5-4.75 ft in depth at the [facility] pool. Temp of water was 91.  Pt entered/exited the pool via stairs with hand rail.   Pt requires the buoyancy and hydrostatic pressure of water for support, and to offload joints by unweighting joint load by at least 50 % in navel deep water and by at least 75-80% in chest to neck deep water.  Viscosity of the water is needed for resistance of strengthening. Water current perturbations provides challenge to standing balance requiring increased core activation. Aquatic PT exercises Sidestepping length of pool 3 round trips with mini squat Forward walking  3 round trips backward walking 3 round trips March walking  3 round trips with water dumbells Tandem walk 3 round trips with water dumbells Leg swings hip abd/add X 15 bilat with UE support with UE support Leg swings hip flexion/extension X `5 bilat Gastroc stretch 30 sec X 3 off of first step Step ups onto first step of pool X10 bilat with UE support Heel raises 2X10 with UE support off of first step Squats X15 with UE support off of first step Hip circles CW and CCW X 15 each bilat 1/4 squat with shoulder extension X15 with kickboard 1/4 lunge with push/pull kickboard 2X15 Lunge step to pool wall, touch wall, then step back, X 10 bilat Monster walks 3 round trips with dumbells Seated on pool chair and cycling and hip abd/add 3 min total  05/17/2023 Therapeutic Exercise: Slantboard gastroc stretch 30 sec X 3 Nustep  lvl 6 12 mins UE/LE  Neuro Re-ed Tandem stance on foam 1 min x 2 bilaterally with occasional HHA SLS with contralateral step over and back tap 6 inch hurdle x 15 bilateral with occasional HHA Alternating heel/toe lifting in standing x 20 with occasional HHA  Modalities: Vaso Lt ankle, medium compression 34 deg , supine w/ LE elevation  TODAY'S TREATMENT                                                                             DATE:  05/11/2023 Therapeutic  Exercise: Slantboard gastroc stretch 30 sec X 3 Alternating heel/toe lifts with hands on rail x 15 each way Supine elevated Lt ankle DF, inversion, eversion, PF Green band x 20 slow eccentric focus Seated Lt leg LAQ 4 lbs 2 x 15  TherActivity Nustep  lvl 6 12 mins UE/LE to improve tolerance to aerobic exercise and walking in less WB pressure.  Step up /reverse step down 4 inch step x 20 bilateral with single hand rail assist   Modalities: Vaso Lt ankle, medium compression 34 deg , supine w/ LE elevation     PATIENT EDUCATION:  03/26/2023  Education details: HEP update Person educated: Patient Education method: Programmer, multimedia, Demonstration, Verbal cues, and Handouts Education comprehension: verbalized understanding, returned demonstration, and verbal cues required  HOME EXERCISE PROGRAM: Access Code: [email or web address] URL: [email or web address] Date: 03/26/2023 Prepared by: Chyrel Masson  Exercises - Supine Heel Slide (Mirrored)  - 3-5 x daily - 7 x weekly - 1-2 sets - 10 reps - 2 hold - Seated Long Arc Quad (Mirrored)  - 3-5 x daily - 7 x weekly - 1-2 sets - 10 reps - 2 hold - Seated Gastroc Stretch with Strap (Mirrored)  - 2-3 x daily - 7 x weekly - 1 sets - 3 reps - 30 hold - Seated Quad Set (Mirrored)  - 3-5 x daily - 7 x weekly - 1 sets - 10 reps - 5 hold - Seated Straight Leg Heel Taps  - 1-2 x daily - 7 x weekly - 2-3 sets - 10-15 reps - 2-3 hold - Long Sitting Eccentric Ankle Plantar Flexion with Resistance  - 1-2 x daily - 7 x weekly - 3 sets - 10 reps - Long Sitting Ankle Eversion with Resistance  - 1 x daily - 7 x weekly - 1-2 sets - 10-15 reps -  Long Sitting Ankle Inversion with Resistance  - 1 x daily - 7 x weekly - 1-2 sets - 10-15 reps - Seated Ankle Dorsiflexion with Resistance  - 1 x daily - 7 x weekly - 1-2 sets - 10-15 reps  ASSESSMENT:  CLINICAL IMPRESSION: She had good tolerance to session for the most part was was a little limited with cramping  to her foot/ankle. Stretching this did appear to help some. She will continue to benefit from skilled PT to improve function.  OBJECTIVE IMPAIRMENTS: Abnormal gait, decreased activity tolerance, decreased balance, decreased coordination, decreased endurance, decreased mobility, difficulty walking, decreased ROM, decreased strength, hypomobility, increased edema, impaired perceived functional ability, impaired flexibility, improper body mechanics, postural dysfunction, and pain.   ACTIVITY LIMITATIONS: carrying, lifting, bending, sitting, standing, squatting, sleeping, stairs, transfers, bed mobility, bathing, toileting, dressing, and locomotion level  PARTICIPATION LIMITATIONS: meal prep, cleaning, laundry, interpersonal relationship, driving, shopping, and community activity  PERSONAL FACTORS:  History of 05/2022 lumbar surgery, GERD, COPD, HTN, history of arthritis, multiple treatment areas current  are also affecting patient's functional outcome.   REHAB POTENTIAL: Fair to good  CLINICAL DECISION MAKING: Evolving/moderate complexity  EVALUATION COMPLEXITY: Moderate   GOALS: Goals reviewed with patient? Yes  SHORT TERM GOALS: (target date for Short term goals are 3 weeks 03/09/2023)   1.  Patient will demonstrate independent use of home exercise program to maintain progress from in clinic treatments.  Goal status: Met   LONG TERM GOALS: (target dates for all long term goals are 10 weeks  06/11/2023 )   1. Patient will demonstrate/report pain at worst less than or equal to 2/10 to facilitate minimal limitation in daily activity secondary to pain symptoms.  Goal status: on going 05/11/2023   2. Patient will demonstrate independent use of home exercise program to facilitate ability to maintain/progress functional gains from skilled physical therapy services.  Goal status:  on going 05/11/2023   3. Patient will demonstrate FOTO outcome > or = 54 % to indicate reduced disability due  to condition.  Goal status:on going 05/11/2023   4.  Patient will demonstrate Lt LE MMT 5/5 throughout to faciltiate usual transfers, stairs, squatting at [facility] for daily life.   Goal status: on going 05/11/2023   5.  Patient will demonstrate ability to ascend/descend stairs reciprocal gait pattern with single hand rail assist.  Goal status: on going 05/11/2023   6.  Patient will demonstrate Lt knee AROM 0-110 deg to facilitate ability to perform transfers, ambulation and other activity in daily life.  Goal status: on going 05/11/2023   7.  Patient will demonstrate ability to ambulation community distances > 500 ft.  Goal Status: on going 05/11/2023  8. Patient will demonstrate TUG independent or with cane < 14 seconds.   Goal status: on going 05/11/2023   PLAN:  PT FREQUENCY: 1-2x/week  PT DURATION: 6 weeks  (added 2 weeks for additional time)  PLANNED INTERVENTIONS: Therapeutic exercises, Therapeutic activity, Neuro Muscular re-education, Balance training, Gait training, Patient/Family education, Joint mobilization, Stair training, DME instructions, Dry Needling, Electrical stimulation, Traction, Cryotherapy, vasopneumatic deviceMoist heat, Taping, Ultrasound, Ionotophoresis 4mg /ml Dexamethasone, and aquatic therapy, Manual therapy.  All included unless contraindicated  PLAN FOR NEXT SESSION: strengthening and ROM as tolerated to improve function  Ivery Quale, PT, DPT 05/18/23 8:56 AM

## 2023-05-20 ENCOUNTER — Other Ambulatory Visit: Payer: Self-pay | Admitting: Internal Medicine

## 2023-05-21 ENCOUNTER — Other Ambulatory Visit: Payer: Self-pay

## 2023-05-22 ENCOUNTER — Ambulatory Visit (INDEPENDENT_AMBULATORY_CARE_PROVIDER_SITE_OTHER): Payer: Medicare Other | Admitting: Rehabilitative and Restorative Service Providers"

## 2023-05-22 ENCOUNTER — Encounter: Payer: Self-pay | Admitting: Rehabilitative and Restorative Service Providers"

## 2023-05-22 DIAGNOSIS — M25562 Pain in left knee: Secondary | ICD-10-CM | POA: Diagnosis not present

## 2023-05-22 DIAGNOSIS — M6281 Muscle weakness (generalized): Secondary | ICD-10-CM

## 2023-05-22 DIAGNOSIS — G8929 Other chronic pain: Secondary | ICD-10-CM

## 2023-05-22 DIAGNOSIS — M79662 Pain in left lower leg: Secondary | ICD-10-CM

## 2023-05-22 DIAGNOSIS — R600 Localized edema: Secondary | ICD-10-CM | POA: Diagnosis not present

## 2023-05-22 DIAGNOSIS — R262 Difficulty in walking, not elsewhere classified: Secondary | ICD-10-CM

## 2023-05-22 NOTE — Therapy (Signed)
OUTPATIENT PHYSICAL THERAPY TREATMENT  Patient Name: Frances Nelson MRN: [id number] DOB:[birth year], 67 y.o., female Today's Date: 05/22/2023  END OF SESSION:  PT End of Session - 05/22/23 1104     Visit Number 24    Number of Visits 29    Date for PT Re-Evaluation 06/11/23    Authorization Type MCR    Authorization Time Period KX required past 15 visits    Progress Note Due on Visit 29    PT Start Time 1101    PT Stop Time 1150    PT Time Calculation (min) 49 min    Activity Tolerance Patient tolerated treatment well    Behavior During Therapy WFL for tasks assessed/performed                   Past Medical History:  Diagnosis Date   Arthritis    Asthma    COPD (chronic obstructive pulmonary disease) (HCC)    GERD (gastroesophageal reflux disease)    Hypertension    Pneumonia    Past Surgical History:  Procedure Laterality Date   ABDOMINAL HYSTERECTOMY     APPENDECTOMY     HERNIA REPAIR     Umbilical x 3   KNEE ARTHROSCOPY W/ MENISCAL REPAIR Left 12/2022   TONSILLECTOMY     TRANSFORAMINAL LUMBAR INTERBODY FUSION (TLIF) WITH PEDICLE SCREW FIXATION 1 LEVEL N/A 05/18/2022   Procedure: Lumbar Four-Five Open Laminectomy/Transforaminal Lumbar Interbody Fusion/Posterolateral fusion;  Surgeon: Jadene Pierini, MD;  Location: [facility] OR;  Service: Neurosurgery;  Laterality: N/A;   Patient Active Problem List   Diagnosis Date Noted   Microhematuria 04/09/2023   Bilateral shoulder pain 04/09/2023   Urethritis 10/05/2022   External otitis of left ear 10/05/2022   Spondylolisthesis of lumbar region 05/18/2022   Smoker 04/08/2022   Deviated nasal septum 04/08/2022   Bilateral pain of leg and foot 04/08/2022   Follicular acne 04/06/2022   Baker cyst, left 04/06/2022   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   Depression 10/04/2021   Left knee pain 10/04/2021   Chronic pain 04/09/2021   Pelvic pain 04/09/2021   HLD (hyperlipidemia)  04/09/2021   Vitamin D deficiency 04/09/2021   Estrogen deficiency 04/09/2021   Hyperglycemia 04/01/2021   Chronic cough 12/30/2020   GERD (gastroesophageal reflux disease) 11/06/2019   Bilateral leg edema 10/16/2019   Pleuritic chest pain 10/16/2019   Hypertension 10/02/2019   Arthralgia 06/28/2018   Sinusitis 06/28/2018   Renal cyst 05/03/2018   Syncope 05/03/2018   Stress and adjustment reaction 05/03/2018   History of tobacco use 05/02/2018   Primary osteoarthritis of both hands 04/10/2018   Primary osteoarthritis of both knees 04/10/2018   Primary osteoarthritis of both feet 04/10/2018   Achilles tendinitis 04/02/2018   Screening for breast cancer 03/15/2018   Positive ANA (antinuclear antibody) 03/15/2018   Screening for colon cancer 03/15/2018   Rash 03/15/2018   Achilles tendon pain 11/13/2017   Cervical myelopathy with cervical radiculopathy (HCC) 11/13/2017   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   Allergic rhinitis 06/19/2017   Edema 12/24/2015   Carpal tunnel syndrome, right 12/16/2015   Hypercalcemia 11/11/2015   Pain in both upper extremities 11/11/2015   Drug reaction 10/28/2015   Muscle spasms of both lower extremities 10/28/2015   Nerve pain 10/28/2015   Chronic asthmatic bronchitis with acute exacerbation (HCC) 09/06/2015   Environmental and seasonal allergies 07/17/2014    PCP: Corwin Levins MD  REFERRING PROVIDER: Kathryne Hitch, MD  REFERRING DIAG: X32.440 (ICD-10-CM) - Status post arthroscopic surgery of left knee M76.60 (ICD-10-CM) - Achilles tendon pain  THERAPY DIAG:  Chronic pain of left knee  Pain in left lower leg  Muscle weakness (generalized)  Localized edema  Difficulty in walking, not elsewhere classified  Rationale for Evaluation and Treatment: Rehabilitation  ONSET DATE: Surgery 12/21/2022 on Lt knee  SUBJECTIVE:   SUBJECTIVE STATEMENT: Pt indicated feeling Lt foot cramp/pains while in water last time but not  afterward.  Pt indicated "not terrible" today for symptoms.  Pt indicated using patches on knee that seemed to be helpful.  Foot /ankle still hurting similarly.   PERTINENT HISTORY: History of 05/2022 lumbar surgery, GERD, COPD, HTN, history of arthritis  PAIN:  NPRS scale: arrival Lt knee: 4/10    Lt ankle:  5/10 Pain location: Lt knee, Lt achilles Pain description: constant, throbbing,  Aggravating factors: household cleaning, WB, stairs Relieving factors: nothing specific   PRECAUTIONS: None  WEIGHT BEARING RESTRICTIONS: No  FALLS:  Has patient fallen in last 6 months? 1 fall without medical attention.   LIVING ENVIRONMENT: Lives in: House/apartment Stairs: multistory with bedroom upstairs but has moved bed downstairs.   Rail on Lt side going up.   OCCUPATION: Retired   PLOF: Independent, walking for exercise (5-6 miles/day in the past until 2012).  Dog walking   PATIENT GOALS: Reduce pain, move better, walk better without cane.   OBJECTIVE: (objective measures completed at initial evaluation unless otherwise dated)   PATIENT SURVEYS:  04/30/2023: FOTO update:   40.2  03/26/2023:  FOTO update:  44  02/16/2023 FOTO intake:  38  predicted:  54  COGNITION: 02/16/2023 Overall cognitive status: WFL    SENSATION: 02/16/2023 No specific check today  EDEMA:  02/16/2023 Lt leg edema noted from knee down compared to Lt.   MUSCLE LENGTH: 02/16/2023 No specific testing  POSTURE:  02/16/2023 Reduced lumbar lordosis in standing, Weight shift off Lt leg to Rt.   PALPATION: 02/16/2023 Tenderness around swollen Lt ankle, Lt knee to light touch.   LOWER EXTREMITY ROM:   ROM Right 02/16/2023 Left 02/16/2023 Left 02/21/2023 Left 03/02/2023 Left 03/26/2023 Left 04/30/2023 Left 05/17/2023  Hip flexion         Hip extension         Hip abduction         Hip adduction         Hip internal rotation         Hip external rotation         Knee flexion 125 92 AROM in supine heel slide c  pain PROM 95/100 grossly assesed in sitting.  90 AROM in supine heel slide c pain 96 AROM in supine heel slide c pain 98 AROM in heel slide  104 AAROM in heel slide    Knee extension 0 0 AROM in quad set supine  0 AROM in seated quad set  0 AROM in supine   Ankle dorsiflexion      0 5  Ankle plantarflexion      45 39  Ankle inversion         Ankle eversion          (Blank rows = not tested)  LOWER EXTREMITY MMT:  MMT Right 02/16/2023 Left 02/16/2023 Left 03/13/2023 Right 03/26/2023 Left 03/26/2023 Left 04/10/2023 Left 04/30/2023  Hip flexion 5/5 4/5     4+/5  Hip extension         Hip abduction  Hip adduction         Hip internal rotation         Hip external rotation         Knee flexion 5/5 4/5 4/5  4+/5    Knee extension 4/5 3+/5 4-/5 5/5 52.9, 52 lbs  4-/5  15.9, 16.5 lbs with pain  4/5 21.4, 25.9 lbs  Ankle dorsiflexion 5/5 3+/5 c pain 4/5  4+/5 5/5   Ankle plantarflexion  2+/5 c pain (unable to perform standing PF) 2+/5 c pain (unable to perform standing PF)  2+/5  3/5 (1 rep in standing single leg PF ,pain noted)  Ankle inversion 5/5 3+/5 c pain 4/5 c pain  4+/5    Ankle eversion 5/5 3+/5 c pain 3+/5 c pain  4/5     (Blank rows = not tested)  LOWER EXTREMITY SPECIAL TESTS:  02/16/2023 No specific testing today.   FUNCTIONAL TESTS:  05/11/2023: TUG independent:  15.41, 17.3 seconds   04/30/2023: Rt SLS 6 seconds Lt SLS 5 seconds  TUG independent:  17.76 seconds  04/16/2023:   Lt SLS: 4 seconds Rt SLS:   7 seconds  03/26/2023:  18 inch chair transfer: 2 tries required  02/16/2023 18 inch chair transfer: multiple tries required c noted deviation to Rt leg Lt SLS:  < 3 seconds Rt SLS: < 3 seconds  GAIT: 02/16/2023 Mod independent ambulation c SPC in Rt UE, decreased stance on Lt leg, decreased toe off progression noted.                                                                                                                                                                          TODAY'S TREATMENT                                                                             DATE: 05/22/2023 Therapeutic Exercise: Nustep  lvl 6 12:45  mins UE/LE Seated LAQ Lt knee AROM, supine heel slide AROM x 10 each  TherActivity ( to improve ambulation, transfers, stair naviation) Forward step up 6 inch step with bilaterally hand assist on bar Rt leg x 10, Lt leg x 10 on 4  Leg press double leg  62 lbs x 15,  single leg 2 x 15  31 lbs bilaterally   Neuro Re-ed Tandem stance on floor 1 min x 1 bilateral, on foam 1 min x 1 bilateral with occasional HHA  Modalities: Vaso Lt ankle, medium compression 34 deg , supine w/ LE elevation   TODAY'S TREATMENT                                                                             DATE:  05/18/23  Pt seen for aquatic therapy today.  Treatment took place in water 3.5-4.75 ft in depth at the [facility] pool. Temp of water was 91.  Pt entered/exited the pool via stairs with hand rail.   Pt requires the buoyancy and hydrostatic pressure of water for support, and to offload joints by unweighting joint load by at least 50 % in navel deep water and by at least 75-80% in chest to neck deep water.  Viscosity of the water is needed for resistance of strengthening. Water current perturbations provides challenge to standing balance requiring increased core activation. Aquatic PT exercises Sidestepping length of pool 3 round trips with mini squat Forward walking  3 round trips backward walking 3 round trips March walking 3 round trips with water dumbells Tandem walk 3 round trips with water dumbells Leg swings hip abd/add X 15 bilat with UE support with UE support Leg swings hip flexion/extension X `5 bilat Gastroc stretch 30 sec X 3 off of first step Step ups onto first step of pool X10 bilat with UE support Heel raises 2X10 with UE support off of first step Squats X15 with UE support off of first step Hip  circles CW and CCW X 15 each bilat 1/4 squat with shoulder extension X15 with kickboard 1/4 lunge with push/pull kickboard 2X15 Lunge step to pool wall, touch wall, then step back, X 10 bilat Monster walks 3 round trips with dumbells Seated on pool chair and cycling and hip abd/add 3 min total  TODAY'S TREATMENT                                                                             DATE: 05/17/2023 Therapeutic Exercise: Slantboard gastroc stretch 30 sec X 3 Nustep  lvl 6 12 mins UE/LE  Neuro Re-ed Tandem stance on foam 1 min x 2 bilaterally with occasional HHA SLS with contralateral step over and back tap 6 inch hurdle x 15 bilateral with occasional HHA Alternating heel/toe lifting in standing x 20 with occasional HHA  Modalities: Vaso Lt ankle, medium compression 34 deg , supine w/ LE elevation  TODAY'S TREATMENT                                                                             DATE:  05/11/2023 Therapeutic Exercise: Slantboard gastroc stretch 30 sec X 3 Alternating heel/toe  lifts with hands on rail x 15 each way Supine elevated Lt ankle DF, inversion, eversion, PF Green band x 20 slow eccentric focus Seated Lt leg LAQ 4 lbs 2 x 15  TherActivity Nustep  lvl 6 12 mins UE/LE to improve tolerance to aerobic exercise and walking in less WB pressure.  Step up /reverse step down 4 inch step x 20 bilateral with single hand rail assist   Modalities: Vaso Lt ankle, medium compression 34 deg , supine w/ LE elevation     PATIENT EDUCATION:  03/26/2023  Education details: HEP update Person educated: Patient Education method: Programmer, multimedia, Demonstration, Verbal cues, and Handouts Education comprehension: verbalized understanding, returned demonstration, and verbal cues required  HOME EXERCISE PROGRAM: Access Code: [email or web address] URL: [email or web address] Date: 03/26/2023 Prepared by: Chyrel Masson  Exercises - Supine Heel Slide (Mirrored)  - 3-5  x daily - 7 x weekly - 1-2 sets - 10 reps - 2 hold - Seated Long Arc Quad (Mirrored)  - 3-5 x daily - 7 x weekly - 1-2 sets - 10 reps - 2 hold - Seated Gastroc Stretch with Strap (Mirrored)  - 2-3 x daily - 7 x weekly - 1 sets - 3 reps - 30 hold - Seated Quad Set (Mirrored)  - 3-5 x daily - 7 x weekly - 1 sets - 10 reps - 5 hold - Seated Straight Leg Heel Taps  - 1-2 x daily - 7 x weekly - 2-3 sets - 10-15 reps - 2-3 hold - Long Sitting Eccentric Ankle Plantar Flexion with Resistance  - 1-2 x daily - 7 x weekly - 3 sets - 10 reps - Long Sitting Ankle Eversion with Resistance  - 1 x daily - 7 x weekly - 1-2 sets - 10-15 reps - Long Sitting Ankle Inversion with Resistance  - 1 x daily - 7 x weekly - 1-2 sets - 10-15 reps - Seated Ankle Dorsiflexion with Resistance  - 1 x daily - 7 x weekly - 1-2 sets - 10-15 reps  ASSESSMENT:  CLINICAL IMPRESSION: Increased strengthening for LE in WB activity today to help improve ambulation, stairs navigation.  Limits due to Lt knee symptoms noted in activity.  Compliant surface balance still challenging.    Medical necessity for continued treatment indicated at this time to continue to improve functional movement patterns including ambulation, stair navigation, and progression towards independence without cane.   OBJECTIVE IMPAIRMENTS: Abnormal gait, decreased activity tolerance, decreased balance, decreased coordination, decreased endurance, decreased mobility, difficulty walking, decreased ROM, decreased strength, hypomobility, increased edema, impaired perceived functional ability, impaired flexibility, improper body mechanics, postural dysfunction, and pain.   ACTIVITY LIMITATIONS: carrying, lifting, bending, sitting, standing, squatting, sleeping, stairs, transfers, bed mobility, bathing, toileting, dressing, and locomotion level  PARTICIPATION LIMITATIONS: meal prep, cleaning, laundry, interpersonal relationship, driving, shopping, and community  activity  PERSONAL FACTORS:  History of 05/2022 lumbar surgery, GERD, COPD, HTN, history of arthritis, multiple treatment areas current  are also affecting patient's functional outcome.   REHAB POTENTIAL: Fair to good  CLINICAL DECISION MAKING: Evolving/moderate complexity  EVALUATION COMPLEXITY: Moderate   GOALS: Goals reviewed with patient? Yes  SHORT TERM GOALS: (target date for Short term goals are 3 weeks 03/09/2023)   1.  Patient will demonstrate independent use of home exercise program to maintain progress from in clinic treatments.  Goal status: Met   LONG TERM GOALS: (target dates for all long term goals are 10 weeks  06/11/2023 )   1. Patient will demonstrate/report pain at  worst less than or equal to 2/10 to facilitate minimal limitation in daily activity secondary to pain symptoms.  Goal status: on going 05/11/2023   2. Patient will demonstrate independent use of home exercise program to facilitate ability to maintain/progress functional gains from skilled physical therapy services.  Goal status:  on going 05/11/2023   3. Patient will demonstrate FOTO outcome > or = 54 % to indicate reduced disability due to condition.  Goal status:on going 05/11/2023   4.  Patient will demonstrate Lt LE MMT 5/5 throughout to faciltiate usual transfers, stairs, squatting at [facility] for daily life.   Goal status: on going 05/11/2023   5.  Patient will demonstrate ability to ascend/descend stairs reciprocal gait pattern with single hand rail assist.  Goal status: on going 05/11/2023   6.  Patient will demonstrate Lt knee AROM 0-110 deg to facilitate ability to perform transfers, ambulation and other activity in daily life.  Goal status: on going 05/11/2023   7.  Patient will demonstrate ability to ambulation community distances > 500 ft.  Goal Status: on going 05/11/2023  8. Patient will demonstrate TUG independent or with cane < 14 seconds.   Goal status: on going  05/11/2023   PLAN:  PT FREQUENCY: 1-2x/week  PT DURATION: 6 weeks  (added 2 weeks for additional time)  PLANNED INTERVENTIONS: Therapeutic exercises, Therapeutic activity, Neuro Muscular re-education, Balance training, Gait training, Patient/Family education, Joint mobilization, Stair training, DME instructions, Dry Needling, Electrical stimulation, Traction, Cryotherapy, vasopneumatic deviceMoist heat, Taping, Ultrasound, Ionotophoresis 4mg /ml Dexamethasone, and aquatic therapy, Manual therapy.  All included unless contraindicated  PLAN FOR NEXT SESSION: WB strengthening as able, compliant surface balance progression.   Chyrel Masson, PT, DPT, OCS, ATC 05/22/23  11:42 AM

## 2023-05-25 ENCOUNTER — Other Ambulatory Visit: Payer: Self-pay

## 2023-05-25 ENCOUNTER — Emergency Department (HOSPITAL_COMMUNITY)
Admission: EM | Admit: 2023-05-25 | Discharge: 2023-05-25 | Disposition: A | Discharge status: Home / Self Care | Payer: Medicare Other | Attending: Emergency Medicine | Admitting: Emergency Medicine

## 2023-05-25 ENCOUNTER — Ambulatory Visit: Payer: Medicare Other | Admitting: Physical Therapy

## 2023-05-25 ENCOUNTER — Ambulatory Visit: Payer: Medicare Other | Admitting: Internal Medicine

## 2023-05-25 DIAGNOSIS — H9202 Otalgia, left ear: Secondary | ICD-10-CM | POA: Diagnosis present

## 2023-05-25 DIAGNOSIS — B0221 Postherpetic geniculate ganglionitis: Secondary | ICD-10-CM | POA: Insufficient documentation

## 2023-05-25 DIAGNOSIS — F17210 Nicotine dependence, cigarettes, uncomplicated: Secondary | ICD-10-CM | POA: Insufficient documentation

## 2023-05-25 DIAGNOSIS — I10 Essential (primary) hypertension: Secondary | ICD-10-CM | POA: Insufficient documentation

## 2023-05-25 DIAGNOSIS — Z79899 Other long term (current) drug therapy: Secondary | ICD-10-CM | POA: Insufficient documentation

## 2023-05-25 DIAGNOSIS — R21 Rash and other nonspecific skin eruption: Secondary | ICD-10-CM | POA: Insufficient documentation

## 2023-05-25 DIAGNOSIS — J449 Chronic obstructive pulmonary disease, unspecified: Secondary | ICD-10-CM | POA: Insufficient documentation

## 2023-05-25 MED ORDER — VALACYCLOVIR HCL 1 G PO TABS: 1000.0000 mg | ORAL_TABLET | Freq: Three times a day (TID) | ORAL | 0 refills | Status: AC

## 2023-05-25 MED ORDER — PREDNISONE 20 MG PO TABS
60.0000 mg | ORAL_TABLET | Freq: Once | ORAL | Status: AC
  Administered 2023-05-25: 60 mg via ORAL
  Filled 2023-05-25: qty 3

## 2023-05-25 MED ORDER — VALACYCLOVIR HCL 500 MG PO TABS
1000.0000 mg | ORAL_TABLET | Freq: Once | ORAL | Status: AC
  Administered 2023-05-25: 1000 mg via ORAL
  Filled 2023-05-25: qty 2

## 2023-05-25 MED ORDER — PREDNISONE 10 MG PO TABS: 60.0000 mg | ORAL_TABLET | Freq: Every day | ORAL | 0 refills | Status: AC

## 2023-05-25 NOTE — Discharge Instructions (Addendum)
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  You can take valacyclovir, 1000 mg 3 times per day for the next 1 week.  You can take prednisone 60 mg daily for the next 6 days.  Follow-up with your primary care doctor on Monday or Tuesday.  Like we discussed, you may notice some drooping of the left side of your face.  This is normal with Ramsay Hunt.  If you start to notice that drooping, when you go to sleep, you should tape your eyelid shut so that it does not dry out.  You can take Tylenol for pain.

## 2023-05-25 NOTE — ED Triage Notes (Signed)
Pt reports feeling of fullness in left ear x2 days, last night noticed swollen nodes under left ear. This morning ear was bleeding, painful. Also noticed red bumps/spots across chin to left ear this morning. No medication changes.

## 2023-05-25 NOTE — ED Provider Notes (Signed)
[place] EMERGENCY DEPARTMENT AT [facility] Provider Note  CSN: [id number] Arrival date & time: 05/25/23 0746  Chief Complaint(s) Otalgia  HPI Frances Nelson is a 67 y.o. female here today with ear pain, a rash on her face.  Patient says that she had a fullness in her left ear for the last 2 days.  This morning she woke up with pain and a rash on the left side of her face.  She has no pain in her eyes.  Patient has not received the shingles vaccine.   Past Medical History Past Medical History:  Diagnosis Date   Arthritis    Asthma    COPD (chronic obstructive pulmonary disease) (HCC)    GERD (gastroesophageal reflux disease)    Hypertension    Pneumonia    Patient Active Problem List   Diagnosis Date Noted   Microhematuria 04/09/2023   Bilateral shoulder pain 04/09/2023   Urethritis 10/05/2022   External otitis of left ear 10/05/2022   Spondylolisthesis of lumbar region 05/18/2022   Smoker 04/08/2022   Deviated nasal septum 04/08/2022   Bilateral pain of leg and foot 04/08/2022   Follicular acne 04/06/2022   Baker cyst, left 04/06/2022   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   Depression 10/04/2021   Left knee pain 10/04/2021   Chronic pain 04/09/2021   Pelvic pain 04/09/2021   HLD (hyperlipidemia) 04/09/2021   Vitamin D deficiency 04/09/2021   Estrogen deficiency 04/09/2021   Hyperglycemia 04/01/2021   Chronic cough 12/30/2020   GERD (gastroesophageal reflux disease) 11/06/2019   Bilateral leg edema 10/16/2019   Pleuritic chest pain 10/16/2019   Hypertension 10/02/2019   Arthralgia 06/28/2018   Sinusitis 06/28/2018   Renal cyst 05/03/2018   Syncope 05/03/2018   Stress and adjustment reaction 05/03/2018   History of tobacco use 05/02/2018   Primary osteoarthritis of both hands 04/10/2018   Primary osteoarthritis of both knees 04/10/2018   Primary osteoarthritis of both feet 04/10/2018   Achilles tendinitis  04/02/2018   Screening for breast cancer 03/15/2018   Positive ANA (antinuclear antibody) 03/15/2018   Screening for colon cancer 03/15/2018   Rash 03/15/2018   Achilles tendon pain 11/13/2017   Cervical myelopathy with cervical radiculopathy (HCC) 11/13/2017   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   Allergic rhinitis 06/19/2017   Edema 12/24/2015   Carpal tunnel syndrome, right 12/16/2015   Hypercalcemia 11/11/2015   Pain in both upper extremities 11/11/2015   Drug reaction 10/28/2015   Muscle spasms of both lower extremities 10/28/2015   Nerve pain 10/28/2015   Chronic asthmatic bronchitis with acute exacerbation (HCC) 09/06/2015   Environmental and seasonal allergies 07/17/2014   Home Medication(s) Prior to Admission medications   Medication Sig Start Date End Date Taking? Authorizing Provider  predniSONE (DELTASONE) 10 MG tablet Take 6 tablets (60 mg total) by mouth daily for 6 days. 05/25/23 05/31/23 Yes Anders Simmonds T, DO  valACYclovir (VALTREX) 1000 MG tablet Take 1 tablet (1,000 mg total) by mouth 3 (three) times daily for 7 days. 05/25/23 06/01/23 Yes Anders Simmonds T, DO  acetaminophen (TYLENOL) 650 MG CR tablet Take 1,300 mg by mouth as needed.    [provider]  acidophilus (RISAQUAD) CAPS capsule Take 1 capsule by mouth in the morning.    [provider]  Adapalene (DIFFERIN) 0.3 % gel Apply 1 Application topically at bedtime. Apply a pea sized amount to face and behind ears 3 nights weekly 11/29/22   Terri Piedra,  DO  B Complex Vitamins (VITAMIN-B COMPLEX PO) Take by mouth daily.    [provider]  Calcium-Vitamins C & D (CALCIUM/C/D PO) Take 1 tablet by mouth in the morning.    [provider]  cetirizine (ZYRTEC) 10 MG tablet Take 10 mg by mouth in the morning.    [provider]  Cholecalciferol (HM VITAMIN D3 PO) Take 5,000 Units by mouth in the morning.    [provider]  clindamycin (CLEOCIN T) 1 % SWAB  Apply 1 Application topically in the morning. Wipe on face and behind ears every morning 11/29/22   Terri Piedra, DO  clobetasol cream (TEMOVATE) 0.05 % Apply 1 Application topically 2 (two) times daily. Apply to affected areas 2 x daily for 2 weeks then stop. Use as needed Patient taking differently: Apply 1 Application topically as needed. Apply to affected areas 2 x daily for 2 weeks then stop. Use as needed 11/29/22   Terri Piedra, DO  COLLAGEN PO Take by mouth. Collagen Peptides    [provider]  doxycycline (VIBRAMYCIN) 50 MG capsule Take 1 capsule (50 mg total) by mouth daily. With food and plenty of fluid 04/26/23   Terri Piedra, DO  DULoxetine (CYMBALTA) 60 MG capsule TAKE 1 CAPSULE BY MOUTH EVERY DAY 05/21/23   Corwin Levins, MD  ezetimibe (ZETIA) 10 MG tablet Take 1 tablet (10 mg total) by mouth daily. 04/13/23   Corwin Levins, MD  hydrochlorothiazide (HYDRODIURIL) 12.5 MG tablet TAKE 1 TABLET BY MOUTH EVERY DAY Patient taking differently: Take 12.5 mg by mouth as needed. 06/28/22   Corwin Levins, MD  Ivermectin 0.5 % LOTN Apply 1 application  topically every morning. 04/26/23   Terri Piedra, DO  lidocaine (LIDODERM) 5 % Place 1 patch onto the skin daily. Remove & Discard patch within 12 hours or as directed by MD 04/30/23   Kirtland Bouchard, PA-C  losartan (COZAAR) 25 MG tablet TAKE 1 TABLET (25 MG TOTAL) BY MOUTH DAILY. 03/26/23   Corwin Levins, MD  MELATONIN PO Take 5 mg by mouth at bedtime.    [provider]  meloxicam (MOBIC) 15 MG tablet Take 0.5 tablets (7.5 mg total) by mouth daily. Patient taking differently: Take 7.5 mg by mouth as needed. 10/03/21   Richardean Sale, DO  methocarbamol (ROBAXIN) 750 MG tablet TAKE 1 TABLET (750 MG TOTAL) BY MOUTH EVERY 6 (SIX) HOURS AS NEEDED FOR MUSCLE SPASMS (NOT COVERED) 06/28/22   Corwin Levins, MD  mometasone (NASONEX) 50 MCG/ACT nasal spray Place 2 sprays into the nose every evening.    [provider]  montelukast (SINGULAIR) 10 MG tablet TAKE 1 TABLET BY MOUTH EVERYDAY AT BEDTIME 09/29/22   Leslye Peer, MD  Multiple Vitamins-Minerals (MULTIVITAMIN WITH MINERALS) tablet Take 1 tablet by mouth in the morning.    [provider]  omeprazole (PRILOSEC) 40 MG capsule TAKE 1 CAPSULE (40 MG TOTAL) BY MOUTH IN THE MORNING AND AT BEDTIME. 12/27/22   Byrum, Les Pou, MD  topiramate (TOPAMAX) 50 MG tablet Take 1 tablet (50 mg total) by mouth 2 (two) times daily. 10/05/22   Corwin Levins, MD  traMADol (ULTRAM) 50 MG tablet Take 1-2 tablets (50-100 mg total) by mouth every 6 (six) hours as needed. 12/28/22   Kathryne Hitch, MD  Past Surgical History Past Surgical History:  Procedure Laterality Date   ABDOMINAL HYSTERECTOMY     APPENDECTOMY     HERNIA REPAIR     Umbilical x 3   KNEE ARTHROSCOPY W/ MENISCAL REPAIR Left 12/2022   TONSILLECTOMY     TRANSFORAMINAL LUMBAR INTERBODY FUSION (TLIF) WITH PEDICLE SCREW FIXATION 1 LEVEL N/A 05/18/2022   Procedure: Lumbar Four-Five Open Laminectomy/Transforaminal Lumbar Interbody Fusion/Posterolateral fusion;  Surgeon: Jadene Pierini, MD;  Location: [facility] OR;  Service: Neurosurgery;  Laterality: N/A;   Family History Family History  Problem Relation Age of Onset   Angina Father    Parkinson's disease Father    Leukemia Sister    Kidney disease Sister    Kidney disease Brother    Brain cancer Brother        glioblastoma    Cancer Brother    Allergies Son    Asthma Son    Allergies Son     Social History Social History   Tobacco Use   Smoking status: Every Day    Current packs/day: 1.00    Average packs/day: 1 pack/day for 49.0 years (49.0 ttl pk-yrs)    Types: Cigarettes    Passive exposure: Never   Smokeless tobacco: Never   Tobacco comments:    1 pack a day  Vaping Use   Vaping  status: Never Used  Substance Use Topics   Alcohol use: No   Drug use: Never   Allergies Symbicort [budesonide-formoterol fumarate], Tramadol, Celebrex [celecoxib], Chocolate flavor, Ciprofloxacin, Egg-derived products, Flavoring agent, Lisinopril, Lyrica [pregabalin], Penicillins, Wellbutrin [bupropion], and Codeine  Review of Systems Review of Systems  Physical Exam Vital Signs  I have reviewed the triage vital signs BP (!) 185/99 (BP Location: Left Arm)   Pulse (!) 101   Temp 98.1 F (36.7 C) (Oral)   Resp 18   Ht 5\' 4"  (1.626 m)   Wt 97.5 kg   SpO2 95%   BMI 36.90 kg/m   Physical Exam Vitals reviewed.  HENT:     Head:     Comments: On the left side of the face, there is a vesicular rash, very tender to the touch.  Stops at midline.  Rash distributed in a cranial nerve VII distribution.  Ear-left ear, multiple vesicles within the ear, and on the tympanic membrane.  No perforation of the tympanic membrane.   Eyes:     General: No scleral icterus.    Extraocular Movements: Extraocular movements intact.     Pupils: Pupils are equal, round, and reactive to light.     Comments: Vision grossly normal, patient denies any eye pain  Cardiovascular:     Rate and Rhythm: Normal rate.  Musculoskeletal:     Cervical back: Normal range of motion.  Skin:    Comments: Vesicular rash in a cranial nerve VII distribution.  2 lesions on the nose.  There is involvement of the lower lip, stopping at midline.  Neurological:     General: No focal deficit present.     Mental Status: She is alert.     Comments: No facial droop     ED Results and Treatments Labs (all labs ordered are listed, but only abnormal results are displayed) Labs Reviewed - No data to display  Radiology No results found.  Pertinent labs & imaging results that were available during my care of  the patient were reviewed by me and considered in my medical decision making (see MDM for details).  Medications Ordered in ED Medications  valACYclovir (VALTREX) tablet 1,000 mg (has no administration in time range)  predniSONE (DELTASONE) tablet 60 mg (has no administration in time range)                                                                                                                                     Procedures Procedures  (including critical care time)  Medical Decision Making / ED Course   This patient presents to the ED for concern of ear pain, facial rash, this involves an extensive number of treatment options, and is a complaint that carries with it a high risk of complications and morbidity.  The differential diagnosis includes Ramsay Hunt syndrome, zoster.  MDM: Six 84-year-old female here today with painful rash and ear fullness.  Patient with classic Ramsay Hunt syndrome.  Does not yet have facial droop, may not develop facial droop.  Symptoms began 2 days ago.  Will start on prednisone, valacyclovir.  Does not appear to be any ocular involvement.  Patient did not receive shingles vaccine.  Will discharge patient with valacyclovir, prednisone.  Have her follow-up with her primary care doctor.   Lab Tests: -I ordered, reviewed, and interpreted labs.   The pertinent results include:   Labs Reviewed - No data to display    EKG   EKG Interpretation Date/Time:    Ventricular Rate:    PR Interval:    QRS Duration:    QT Interval:    QTC Calculation:   R Axis:      Text Interpretation:            Medicines ordered and prescription drug management: Meds ordered this encounter  Medications   valACYclovir (VALTREX) tablet 1,000 mg   predniSONE (DELTASONE) tablet 60 mg   predniSONE (DELTASONE) 10 MG tablet    Sig: Take 6 tablets (60 mg total) by mouth daily for 6 days.    Dispense:  36 tablet    Refill:  0   valACYclovir (VALTREX) 1000 MG  tablet    Sig: Take 1 tablet (1,000 mg total) by mouth 3 (three) times daily for 7 days.    Dispense:  21 tablet    Refill:  0    -I have reviewed the patients home medicines and have made adjustments as needed    Reevaluation: After the interventions noted above, I reevaluated the patient and found that they have :improved  Co morbidities that complicate the patient evaluation  Past Medical History:  Diagnosis Date   Arthritis    Asthma    COPD (chronic obstructive pulmonary disease) (HCC)    GERD (gastroesophageal reflux disease)    Hypertension    Pneumonia  Dispostion: Discharge with outpatient follow-up     Final Clinical Impression(s) / ED Diagnoses Final diagnoses:  Ramsay Hunt auricular syndrome     @PCDICTATION @    Anders Simmonds T, DO 05/25/23 0831

## 2023-05-28 ENCOUNTER — Encounter: Payer: Self-pay | Admitting: Internal Medicine

## 2023-05-28 ENCOUNTER — Encounter: Payer: Self-pay | Admitting: Rehabilitative and Restorative Service Providers"

## 2023-05-28 ENCOUNTER — Ambulatory Visit (INDEPENDENT_AMBULATORY_CARE_PROVIDER_SITE_OTHER): Payer: Medicare Other | Admitting: Internal Medicine

## 2023-05-28 ENCOUNTER — Ambulatory Visit (INDEPENDENT_AMBULATORY_CARE_PROVIDER_SITE_OTHER): Payer: Medicare Other | Admitting: Rehabilitative and Restorative Service Providers"

## 2023-05-28 VITALS — BP 132/80 | HR 93 | Temp 98.6°F | Ht 64.0 in | Wt 210.0 lb

## 2023-05-28 DIAGNOSIS — R262 Difficulty in walking, not elsewhere classified: Secondary | ICD-10-CM

## 2023-05-28 DIAGNOSIS — E559 Vitamin D deficiency, unspecified: Secondary | ICD-10-CM | POA: Diagnosis not present

## 2023-05-28 DIAGNOSIS — M6281 Muscle weakness (generalized): Secondary | ICD-10-CM | POA: Diagnosis not present

## 2023-05-28 DIAGNOSIS — R600 Localized edema: Secondary | ICD-10-CM | POA: Diagnosis not present

## 2023-05-28 DIAGNOSIS — R739 Hyperglycemia, unspecified: Secondary | ICD-10-CM | POA: Diagnosis not present

## 2023-05-28 DIAGNOSIS — B029 Zoster without complications: Secondary | ICD-10-CM | POA: Diagnosis not present

## 2023-05-28 DIAGNOSIS — I10 Essential (primary) hypertension: Secondary | ICD-10-CM | POA: Diagnosis not present

## 2023-05-28 DIAGNOSIS — M79662 Pain in left lower leg: Secondary | ICD-10-CM | POA: Diagnosis not present

## 2023-05-28 DIAGNOSIS — F17200 Nicotine dependence, unspecified, uncomplicated: Secondary | ICD-10-CM | POA: Diagnosis not present

## 2023-05-28 DIAGNOSIS — M25562 Pain in left knee: Secondary | ICD-10-CM | POA: Diagnosis not present

## 2023-05-28 DIAGNOSIS — G8929 Other chronic pain: Secondary | ICD-10-CM

## 2023-05-28 MED ORDER — DULOXETINE HCL 30 MG PO CPEP: ORAL_CAPSULE | ORAL | 11 refills | Status: DC

## 2023-05-28 MED ORDER — TRIAMCINOLONE ACETONIDE 0.1 % MT PSTE: 1.0000 | PASTE | Freq: Two times a day (BID) | OROMUCOSAL | 12 refills | Status: DC

## 2023-05-28 NOTE — Progress Notes (Unsigned)
Patient ID: Frances Nelson, female   DOB: 06-07-1956, 67 y.o.   MRN: [id number]        Chief Complaint: follow up left facial shingles outbreak, hyperglycemia, htn, low vit d       HPI:  Frances Nelson is a 67 y.o. female here with c/o left ear pain and facial rash for 5 days.  Seen at ED with ? Of ramsey hunt but denies Facial paralysis or weakness on one side of the face; Does have a painful rash around the ear, ear canal, earlobe, tongue, or roof of the mouth, but no hearing loss in one ear, vertigo, or dizziness.  Tx with prednisone 60 mg every day course but already has marked improvement in rash and only mild lip and tongue swelling currently.  Pain is improved now 6/10 but still significant, asking for pain management.  Pt denies chest pain, increased sob or doe, wheezing, orthopnea, PND, increased LE swelling, palpitations, dizziness or syncope.   Pt denies polydipsia, polyuria, or new focal neuro s/s.   Still smoking, not ready to quit       Wt Readings from Last 3 Encounters:  05/28/23 210 lb (95.3 kg)  05/25/23 215 lb (97.5 kg)  04/25/23 212 lb (96.2 kg)   BP Readings from Last 3 Encounters:  05/28/23 132/80  05/25/23 (!) 185/99  04/26/23 (!) 138/91         Past Medical History:  Diagnosis Date   Arthritis    Asthma    COPD (chronic obstructive pulmonary disease) (HCC)    GERD (gastroesophageal reflux disease)    Hypertension    Pneumonia    Past Surgical History:  Procedure Laterality Date   ABDOMINAL HYSTERECTOMY     APPENDECTOMY     HERNIA REPAIR     Umbilical x 3   KNEE ARTHROSCOPY W/ MENISCAL REPAIR Left 12/2022   TONSILLECTOMY     TRANSFORAMINAL LUMBAR INTERBODY FUSION (TLIF) WITH PEDICLE SCREW FIXATION 1 LEVEL N/A 05/18/2022   Procedure: Lumbar Four-Five Open Laminectomy/Transforaminal Lumbar Interbody Fusion/Posterolateral fusion;  Surgeon: Jadene Pierini, MD;  Location: [facility] OR;  Service: Neurosurgery;  Laterality: N/A;    reports that she has been  smoking cigarettes. She has a 49 pack-year smoking history. She has never been exposed to tobacco smoke. She has never used smokeless tobacco. She reports that she does not drink alcohol and does not use drugs. family history includes Allergies in her son and son; Angina in her father; Asthma in her son; Brain cancer in her brother; Cancer in her brother; Kidney disease in her brother and sister; Leukemia in her sister; Parkinson's disease in her father. Allergies  Allergen Reactions   Symbicort [Budesonide-Formoterol Fumarate] Other (See Comments)    Patient reported dizziness, nausea, headaches and sore throat while using Symbicort 160. Reported on 09/03/17   Tramadol Hives   Celebrex [Celecoxib]     Bleeding.     Chocolate Flavor    Ciprofloxacin Nausea And Vomiting    Headache, shaking.    Egg-Derived Products    Flavoring Agent     Unknown   Lisinopril Cough    Pt off med for a week, some improvement   Lyrica [Pregabalin]     "Made me out of it."    Penicillins     Patient preference   Wellbutrin [Bupropion]    Codeine Palpitations   Current Outpatient Medications on File Prior to Visit  Medication Sig Dispense Refill   acetaminophen (TYLENOL) 650 MG  CR tablet Take 1,300 mg by mouth as needed.     acidophilus (RISAQUAD) CAPS capsule Take 1 capsule by mouth in the morning.     Adapalene (DIFFERIN) 0.3 % gel Apply 1 Application topically at bedtime. Apply a pea sized amount to face and behind ears 3 nights weekly 45 g 2   B Complex Vitamins (VITAMIN-B COMPLEX PO) Take by mouth daily.     Calcium-Vitamins C & D (CALCIUM/C/D PO) Take 1 tablet by mouth in the morning.     cetirizine (ZYRTEC) 10 MG tablet Take 10 mg by mouth in the morning.     Cholecalciferol (HM VITAMIN D3 PO) Take 5,000 Units by mouth in the morning.     clindamycin (CLEOCIN T) 1 % SWAB Apply 1 Application topically in the morning. Wipe on face and behind ears every morning 60 each 2   clobetasol cream (TEMOVATE)  0.05 % Apply 1 Application topically 2 (two) times daily. Apply to affected areas 2 x daily for 2 weeks then stop. Use as needed (Patient taking differently: Apply 1 Application topically as needed. Apply to affected areas 2 x daily for 2 weeks then stop. Use as needed) 45 g 0   COLLAGEN PO Take by mouth. Collagen Peptides     doxycycline (VIBRAMYCIN) 50 MG capsule Take 1 capsule (50 mg total) by mouth daily. With food and plenty of fluid 30 capsule 3   ezetimibe (ZETIA) 10 MG tablet Take 1 tablet (10 mg total) by mouth daily. 90 tablet 3   Homeopathic Products (ZICAM ALLERGY RELIEF NA) Place into the nose.     hydrochlorothiazide (HYDRODIURIL) 12.5 MG tablet TAKE 1 TABLET BY MOUTH EVERY DAY (Patient taking differently: Take 12.5 mg by mouth as needed.) 90 tablet 2   Ivermectin 0.5 % LOTN Apply 1 application  topically every morning. 117 g 3   lidocaine (LIDODERM) 5 % Place 1 patch onto the skin daily. Remove & Discard patch within 12 hours or as directed by MD 30 patch 0   losartan (COZAAR) 25 MG tablet TAKE 1 TABLET (25 MG TOTAL) BY MOUTH DAILY. 90 tablet 2   MELATONIN PO Take 5 mg by mouth at bedtime.     meloxicam (MOBIC) 15 MG tablet Take 0.5 tablets (7.5 mg total) by mouth daily. (Patient taking differently: Take 7.5 mg by mouth as needed.) 60 tablet 0   methocarbamol (ROBAXIN) 750 MG tablet TAKE 1 TABLET (750 MG TOTAL) BY MOUTH EVERY 6 (SIX) HOURS AS NEEDED FOR MUSCLE SPASMS (NOT COVERED) 120 tablet 2   mometasone (NASONEX) 50 MCG/ACT nasal spray Place 2 sprays into the nose every evening.     montelukast (SINGULAIR) 10 MG tablet TAKE 1 TABLET BY MOUTH EVERYDAY AT BEDTIME 90 tablet 3   Multiple Vitamins-Minerals (MULTIVITAMIN WITH MINERALS) tablet Take 1 tablet by mouth in the morning.     omeprazole (PRILOSEC) 40 MG capsule TAKE 1 CAPSULE (40 MG TOTAL) BY MOUTH IN THE MORNING AND AT BEDTIME. 180 capsule 3   predniSONE (DELTASONE) 10 MG tablet Take 6 tablets (60 mg total) by mouth daily for 6  days. 36 tablet 0   topiramate (TOPAMAX) 50 MG tablet Take 1 tablet (50 mg total) by mouth 2 (two) times daily. 120 tablet 5   traMADol (ULTRAM) 50 MG tablet Take 1-2 tablets (50-100 mg total) by mouth every 6 (six) hours as needed. 30 tablet 0   valACYclovir (VALTREX) 1000 MG tablet Take 1 tablet (1,000 mg total) by mouth 3 (three)  times daily for 7 days. 21 tablet 0   Current Facility-Administered Medications on File Prior to Visit  Medication Dose Route Frequency Provider Last Rate Last Admin   triamcinolone acetonide (KENALOG) 10 MG/ML injection 10 mg  10 mg Intradermal Once Terri Piedra, DO            ROS:  All others reviewed and negative.  Objective        PE:  BP 132/80 (BP Location: Left Arm, Patient Position: Sitting, Cuff Size: Normal)   Pulse 93   Temp 98.6 F (37 C) (Oral)   Ht 5\' 4"  (1.626 m)   Wt 210 lb (95.3 kg)   SpO2 98%   BMI 36.05 kg/m                 Constitutional: Pt appears in NAD               HENT: Head: NCAT.                Right Ear: External ear normal.                 Left Ear: External ear normal.                Eyes: . Pupils are equal, round, and reactive to light. Conjunctivae and EOM are normal               Nose: without d/c or deformity               Neck: Neck supple. Gross normal ROM               Cardiovascular: Normal rate and regular rhythm.                 Pulmonary/Chest: Effort normal and breath sounds without rales or wheezing.                Abd:  Soft, NT, ND, + BS, no organomegaly               Neurological: Pt is alert. At baseline orientation, motor grossly intact               Skin: Skin is warm.  LE edema - none, left facial grouped vesicles rash drying up, not yet scabbed over, left lower lateral lip mild sweling, tender               Psychiatric: Pt behavior is normal without agitation   Micro: none  Cardiac tracings I have personally interpreted today:  none  Pertinent Radiological findings (summarize): none   Lab  Results  Component Value Date   WBC 8.1 04/05/2023   HGB 14.7 04/05/2023   HCT 45.1 (H) 04/05/2023   PLT 334 04/05/2023   GLUCOSE 103 (H) 04/09/2023   CHOL 228 (H) 04/09/2023   TRIG 231.0 (H) 04/09/2023   HDL 49.40 04/09/2023   LDLDIRECT 143.0 10/05/2022   LDLCALC 132 (H) 04/09/2023   ALT 24 04/09/2023   AST 21 04/09/2023   NA 138 04/09/2023   K 4.5 04/09/2023   CL 102 04/09/2023   CREATININE 0.66 04/09/2023   BUN 13 04/09/2023   CO2 29 04/09/2023   TSH 1.83 10/05/2022   HGBA1C 6.1 04/09/2023   MICROALBUR 0.8 10/05/2022   Assessment/Plan:  Frances Nelson is a 67 y.o. White or Caucasian [1] female with  has a past medical history of Arthritis, Asthma, COPD (chronic obstructive pulmonary disease) (HCC), GERD (gastroesophageal  reflux disease), Hypertension, and Pneumonia.  Vitamin D deficiency Last vitamin D Lab Results  Component Value Date   VD25OH 31.79 10/05/2022   Low, to start oral replacement   Smoker Pt counseled to quit, pt not ready  Hypertension BP Readings from Last 3 Encounters:  05/28/23 132/80  05/25/23 (!) 185/99  04/26/23 (!) 138/91   Stable, pt to continue medical treatment hct 12.5 every day, losartan 25 qd   Hyperglycemia Lab Results  Component Value Date   HGBA1C 6.1 04/09/2023   Stable, pt to continue current medical treatment  - diet, wt control   Shingles outbreak Left face/otic without facial weakness - to reduce prednisone to 30 every day x 3 more days, add gabapentin 100 tid, and triam paste for lip and tongue prn use,  to f/u any worsening symptoms or concerns  Followup: Return if symptoms worsen or fail to improve.  Oliver Barre, MD 05/29/2023 8:05 PM [facility]

## 2023-05-28 NOTE — Therapy (Signed)
OUTPATIENT PHYSICAL THERAPY TREATMENT  Patient Name: Frances Nelson MRN: [id number] DOB:10-Nov-1955, 67 y.o., female Today's Date: 05/28/2023  END OF SESSION:  PT End of Session - 05/28/23 1112     Visit Number 25    Number of Visits 29    Date for PT Re-Evaluation 06/11/23    Authorization Type MCR    Authorization Time Period KX required past 15 visits    Progress Note Due on Visit 29    PT Start Time 1104    PT Stop Time 1145    PT Time Calculation (min) 41 min    Activity Tolerance Patient limited by fatigue;Treatment limited secondary to medical complications (Comment);Patient limited by pain   shingles   Behavior During Therapy [facility] for tasks assessed/performed                    Past Medical History:  Diagnosis Date   Arthritis    Asthma    COPD (chronic obstructive pulmonary disease) (HCC)    GERD (gastroesophageal reflux disease)    Hypertension    Pneumonia    Past Surgical History:  Procedure Laterality Date   ABDOMINAL HYSTERECTOMY     APPENDECTOMY     HERNIA REPAIR     Umbilical x 3   KNEE ARTHROSCOPY W/ MENISCAL REPAIR Left 12/2022   TONSILLECTOMY     TRANSFORAMINAL LUMBAR INTERBODY FUSION (TLIF) WITH PEDICLE SCREW FIXATION 1 LEVEL N/A 05/18/2022   Procedure: Lumbar Four-Five Open Laminectomy/Transforaminal Lumbar Interbody Fusion/Posterolateral fusion;  Surgeon: Jadene Pierini, MD;  Location: [facility] OR;  Service: Neurosurgery;  Laterality: N/A;   Patient Active Problem List   Diagnosis Date Noted   Microhematuria 04/09/2023   Bilateral shoulder pain 04/09/2023   Urethritis 10/05/2022   External otitis of left ear 10/05/2022   Spondylolisthesis of lumbar region 05/18/2022   Smoker 04/08/2022   Deviated nasal septum 04/08/2022   Bilateral pain of leg and foot 04/08/2022   Follicular acne 04/06/2022   Baker cyst, left 04/06/2022   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   Depression 10/04/2021   Left  knee pain 10/04/2021   Chronic pain 04/09/2021   Pelvic pain 04/09/2021   HLD (hyperlipidemia) 04/09/2021   Vitamin D deficiency 04/09/2021   Estrogen deficiency 04/09/2021   Hyperglycemia 04/01/2021   Chronic cough 12/30/2020   GERD (gastroesophageal reflux disease) 11/06/2019   Bilateral leg edema 10/16/2019   Pleuritic chest pain 10/16/2019   Hypertension 10/02/2019   Arthralgia 06/28/2018   Sinusitis 06/28/2018   Renal cyst 05/03/2018   Syncope 05/03/2018   Stress and adjustment reaction 05/03/2018   History of tobacco use 05/02/2018   Primary osteoarthritis of both hands 04/10/2018   Primary osteoarthritis of both knees 04/10/2018   Primary osteoarthritis of both feet 04/10/2018   Achilles tendinitis 04/02/2018   Screening for breast cancer 03/15/2018   Positive ANA (antinuclear antibody) 03/15/2018   Screening for colon cancer 03/15/2018   Rash 03/15/2018   Achilles tendon pain 11/13/2017   Cervical myelopathy with cervical radiculopathy (HCC) 11/13/2017   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   Allergic rhinitis 06/19/2017   Edema 12/24/2015   Carpal tunnel syndrome, right 12/16/2015   Hypercalcemia 11/11/2015   Pain in both upper extremities 11/11/2015   Drug reaction 10/28/2015   Muscle spasms of both lower extremities 10/28/2015   Nerve pain 10/28/2015   Chronic asthmatic bronchitis with acute exacerbation (HCC) 09/06/2015   Environmental and seasonal allergies 07/17/2014  PCP: Corwin Levins MD  REFERRING PROVIDER: Kathryne Hitch, MD  REFERRING DIAG: [phone] (ICD-10-CM) - Status post arthroscopic surgery of left knee M76.60 (ICD-10-CM) - Achilles tendon pain  THERAPY DIAG:  Chronic pain of left knee  Pain in left lower leg  Muscle weakness (generalized)  Localized edema  Difficulty in walking, not elsewhere classified  Rationale for Evaluation and Treatment: Rehabilitation  ONSET DATE: Surgery 12/21/2022 on Lt knee  SUBJECTIVE:    SUBJECTIVE STATEMENT: Pt indicated Friday waking up with issues related to form of shingles that led to pain issues in other areas with inner ear troubles.  Saw ED Friday about those complaints and has made some improvements since.  Pt indicated feeling off balance and impacted still from those complaints.   PERTINENT HISTORY: History of 05/2022 lumbar surgery, GERD, COPD, HTN, history of arthritis  PAIN:  NPRS scale: arrival Lt knee: 4/10    Lt ankle:  6/10 Pain location: Lt knee, Lt achilles Pain description: constant, throbbing,  Aggravating factors: household cleaning, WB, stairs Relieving factors: nothing specific   PRECAUTIONS: None  WEIGHT BEARING RESTRICTIONS: No  FALLS:  Has patient fallen in last 6 months? 1 fall without medical attention.   LIVING ENVIRONMENT: Lives in: House/apartment Stairs: multistory with bedroom upstairs but has moved bed downstairs.   Rail on Lt side going up.   OCCUPATION: Retired   PLOF: Independent, walking for exercise (5-6 miles/day in the past until 2012).  Dog walking   PATIENT GOALS: Reduce pain, move better, walk better without cane.   OBJECTIVE: (objective measures completed at initial evaluation unless otherwise dated)   PATIENT SURVEYS:  04/30/2023: FOTO update:   40.2  03/26/2023:  FOTO update:  44  02/16/2023 FOTO intake:  38  predicted:  54  COGNITION: 02/16/2023 Overall cognitive status: WFL    SENSATION: 02/16/2023 No specific check today  EDEMA:  02/16/2023 Lt leg edema noted from knee down compared to Lt.   MUSCLE LENGTH: 02/16/2023 No specific testing  POSTURE:  02/16/2023 Reduced lumbar lordosis in standing, Weight shift off Lt leg to Rt.   PALPATION: 02/16/2023 Tenderness around swollen Lt ankle, Lt knee to light touch.   LOWER EXTREMITY ROM:   ROM Right 02/16/2023 Left 02/16/2023 Left 02/21/2023 Left 03/02/2023 Left 03/26/2023 Left 04/30/2023 Left 05/17/2023  Hip flexion         Hip extension         Hip  abduction         Hip adduction         Hip internal rotation         Hip external rotation         Knee flexion 125 92 AROM in supine heel slide c pain PROM 95/100 grossly assesed in sitting.  90 AROM in supine heel slide c pain 96 AROM in supine heel slide c pain 98 AROM in heel slide  104 AAROM in heel slide    Knee extension 0 0 AROM in quad set supine  0 AROM in seated quad set  0 AROM in supine   Ankle dorsiflexion      0 5  Ankle plantarflexion      45 39  Ankle inversion         Ankle eversion          (Blank rows = not tested)  LOWER EXTREMITY MMT:  MMT Right 02/16/2023 Left 02/16/2023 Left 03/13/2023 Right 03/26/2023 Left 03/26/2023 Left 04/10/2023 Left 04/30/2023  Hip flexion 5/5  4/5     4+/5  Hip extension         Hip abduction         Hip adduction         Hip internal rotation         Hip external rotation         Knee flexion 5/5 4/5 4/5  4+/5    Knee extension 4/5 3+/5 4-/5 5/5 52.9, 52 lbs  4-/5  15.9, 16.5 lbs with pain  4/5 21.4, 25.9 lbs  Ankle dorsiflexion 5/5 3+/5 c pain 4/5  4+/5 5/5   Ankle plantarflexion  2+/5 c pain (unable to perform standing PF) 2+/5 c pain (unable to perform standing PF)  2+/5  3/5 (1 rep in standing single leg PF ,pain noted)  Ankle inversion 5/5 3+/5 c pain 4/5 c pain  4+/5    Ankle eversion 5/5 3+/5 c pain 3+/5 c pain  4/5     (Blank rows = not tested)  LOWER EXTREMITY SPECIAL TESTS:  02/16/2023 No specific testing today.   FUNCTIONAL TESTS:  05/11/2023: TUG independent:  15.41, 17.3 seconds   04/30/2023: Rt SLS 6 seconds Lt SLS 5 seconds  TUG independent:  17.76 seconds  04/16/2023:   Lt SLS: 4 seconds Rt SLS:   7 seconds  03/26/2023:  18 inch chair transfer: 2 tries required  02/16/2023 18 inch chair transfer: multiple tries required c noted deviation to Rt leg Lt SLS:  < 3 seconds Rt SLS: < 3 seconds  GAIT: 02/16/2023 Mod independent ambulation c SPC in Rt UE, decreased stance on Lt leg, decreased toe off progression  noted.                                                                                                                                                                         TODAY'S TREATMENT                                                                             DATE: 05/28/2023 Therapeutic Exercise: Nustep  lvl 6 12:45  mins UE/LE Seated LAQ Lt leg 4 lbs 2 x 15 with end range pauses each direction Seated SLR with quad set 2 x 10 , performed bilaterally  Seated PF/DF x 20 bilateral   Modalities: Vaso Lt ankle, medium compression 34 deg , supine w/ LE elevation  TODAY'S TREATMENT  DATE: 05/22/2023 Therapeutic Exercise: Nustep  lvl 6 12:45  mins UE/LE Seated LAQ Lt knee AROM, supine heel slide AROM x 10 each  TherActivity ( to improve ambulation, transfers, stair naviation) Forward step up 6 inch step with bilaterally hand assist on bar Rt leg x 10, Lt leg x 10 on 4  Leg press double leg  62 lbs x 15,  single leg 2 x 15  31 lbs bilaterally   Neuro Re-ed Tandem stance on floor 1 min x 1 bilateral, on foam 1 min x 1 bilateral with occasional HHA   Modalities: Vaso Lt ankle, medium compression 34 deg , supine w/ LE elevation   TODAY'S TREATMENT                                                                             DATE:  05/18/23  Pt seen for aquatic therapy today.  Treatment took place in water 3.5-4.75 ft in depth at the [facility] pool. Temp of water was 91.  Pt entered/exited the pool via stairs with hand rail.   Pt requires the buoyancy and hydrostatic pressure of water for support, and to offload joints by unweighting joint load by at least 50 % in navel deep water and by at least 75-80% in chest to neck deep water.  Viscosity of the water is needed for resistance of strengthening. Water current perturbations provides challenge to standing balance requiring increased core  activation. Aquatic PT exercises Sidestepping length of pool 3 round trips with mini squat Forward walking  3 round trips backward walking 3 round trips March walking 3 round trips with water dumbells Tandem walk 3 round trips with water dumbells Leg swings hip abd/add X 15 bilat with UE support with UE support Leg swings hip flexion/extension X `5 bilat Gastroc stretch 30 sec X 3 off of first step Step ups onto first step of pool X10 bilat with UE support Heel raises 2X10 with UE support off of first step Squats X15 with UE support off of first step Hip circles CW and CCW X 15 each bilat 1/4 squat with shoulder extension X15 with kickboard 1/4 lunge with push/pull kickboard 2X15 Lunge step to pool wall, touch wall, then step back, X 10 bilat Monster walks 3 round trips with dumbells Seated on pool chair and cycling and hip abd/add 3 min total  TODAY'S TREATMENT                                                                             DATE: 05/17/2023 Therapeutic Exercise: Slantboard gastroc stretch 30 sec X 3 Nustep  lvl 6 12 mins UE/LE  Neuro Re-ed Tandem stance on foam 1 min x 2 bilaterally with occasional HHA SLS with contralateral step over and back tap 6 inch hurdle x 15 bilateral with occasional HHA Alternating heel/toe lifting in standing x 20 with occasional HHA  Modalities: Vaso Lt ankle, medium compression  34 deg , supine w/ LE elevation   PATIENT EDUCATION:  03/26/2023  Education details: HEP update Person educated: Patient Education method: Programmer, multimedia, Demonstration, Verbal cues, and Handouts Education comprehension: verbalized understanding, returned demonstration, and verbal cues required  HOME EXERCISE PROGRAM: Access Code: [email or web address] URL: [email or web address] Date: 03/26/2023 Prepared by: Chyrel Masson  Exercises - Supine Heel Slide (Mirrored)  - 3-5 x daily - 7 x weekly - 1-2 sets - 10 reps - 2 hold - Seated Long Arc Quad  (Mirrored)  - 3-5 x daily - 7 x weekly - 1-2 sets - 10 reps - 2 hold - Seated Gastroc Stretch with Strap (Mirrored)  - 2-3 x daily - 7 x weekly - 1 sets - 3 reps - 30 hold - Seated Quad Set (Mirrored)  - 3-5 x daily - 7 x weekly - 1 sets - 10 reps - 5 hold - Seated Straight Leg Heel Taps  - 1-2 x daily - 7 x weekly - 2-3 sets - 10-15 reps - 2-3 hold - Long Sitting Eccentric Ankle Plantar Flexion with Resistance  - 1-2 x daily - 7 x weekly - 3 sets - 10 reps - Long Sitting Ankle Eversion with Resistance  - 1 x daily - 7 x weekly - 1-2 sets - 10-15 reps - Long Sitting Ankle Inversion with Resistance  - 1 x daily - 7 x weekly - 1-2 sets - 10-15 reps - Seated Ankle Dorsiflexion with Resistance  - 1 x daily - 7 x weekly - 1-2 sets - 10-15 reps  ASSESSMENT:  CLINICAL IMPRESSION: Treatment options impacted today due to complaints related to shingles onset.  Limited to sitting based activity due to fatigue, pain and balance impacts.   Medical necessity for continued treatment indicated at this time to continue to improve functional movement patterns including ambulation, stair navigation, and progression towards independence without cane.   OBJECTIVE IMPAIRMENTS: Abnormal gait, decreased activity tolerance, decreased balance, decreased coordination, decreased endurance, decreased mobility, difficulty walking, decreased ROM, decreased strength, hypomobility, increased edema, impaired perceived functional ability, impaired flexibility, improper body mechanics, postural dysfunction, and pain.   ACTIVITY LIMITATIONS: carrying, lifting, bending, sitting, standing, squatting, sleeping, stairs, transfers, bed mobility, bathing, toileting, dressing, and locomotion level  PARTICIPATION LIMITATIONS: meal prep, cleaning, laundry, interpersonal relationship, driving, shopping, and community activity  PERSONAL FACTORS:  History of 05/2022 lumbar surgery, GERD, COPD, HTN, history of arthritis, multiple treatment  areas current  are also affecting patient's functional outcome.   REHAB POTENTIAL: Fair to good  CLINICAL DECISION MAKING: Evolving/moderate complexity  EVALUATION COMPLEXITY: Moderate   GOALS: Goals reviewed with patient? Yes  SHORT TERM GOALS: (target date for Short term goals are 3 weeks 03/09/2023)   1.  Patient will demonstrate independent use of home exercise program to maintain progress from in clinic treatments.  Goal status: Met   LONG TERM GOALS: (target dates for all long term goals are 10 weeks  06/11/2023 )   1. Patient will demonstrate/report pain at worst less than or equal to 2/10 to facilitate minimal limitation in daily activity secondary to pain symptoms.  Goal status: on going 05/11/2023   2. Patient will demonstrate independent use of home exercise program to facilitate ability to maintain/progress functional gains from skilled physical therapy services.  Goal status:  on going 05/11/2023   3. Patient will demonstrate FOTO outcome > or = 54 % to indicate reduced disability due to condition.  Goal status:on going 05/11/2023   4.  Patient will demonstrate Lt  LE MMT 5/5 throughout to faciltiate usual transfers, stairs, squatting at [facility] for daily life.   Goal status: on going 05/11/2023   5.  Patient will demonstrate ability to ascend/descend stairs reciprocal gait pattern with single hand rail assist.  Goal status: on going 05/11/2023   6.  Patient will demonstrate Lt knee AROM 0-110 deg to facilitate ability to perform transfers, ambulation and other activity in daily life.  Goal status: on going 05/11/2023   7.  Patient will demonstrate ability to ambulation community distances > 500 ft.  Goal Status: on going 05/11/2023  8. Patient will demonstrate TUG independent or with cane < 14 seconds.   Goal status: on going 05/11/2023   PLAN:  PT FREQUENCY: 1-2x/week  PT DURATION: 6 weeks  (added 2 weeks for additional time)  PLANNED INTERVENTIONS:  Therapeutic exercises, Therapeutic activity, Neuro Muscular re-education, Balance training, Gait training, Patient/Family education, Joint mobilization, Stair training, DME instructions, Dry Needling, Electrical stimulation, Traction, Cryotherapy, vasopneumatic deviceMoist heat, Taping, Ultrasound, Ionotophoresis 4mg /ml Dexamethasone, and aquatic therapy, Manual therapy.  All included unless contraindicated  PLAN FOR NEXT SESSION: Return to activity as able based off other medical troubles.   Chyrel Masson, PT, DPT, OCS, ATC 05/28/23  11:44 AM

## 2023-05-28 NOTE — Patient Instructions (Signed)
Please take all new medication as prescribed - the paste   Ok to take the gabapentin 100 mg you have at home  Ok to decrease the prednisone to 30 mg per day for the next 3 days, then stop  Please continue all other medications as before, including finishing the valtrex  Please have the pharmacy call with any other refills you may need.  Please continue your efforts at being more active, low cholesterol diet, and weight control  Please keep your appointments with your specialists as you may have planned

## 2023-05-29 ENCOUNTER — Encounter: Payer: Self-pay | Admitting: Internal Medicine

## 2023-05-29 DIAGNOSIS — B029 Zoster without complications: Secondary | ICD-10-CM | POA: Insufficient documentation

## 2023-05-29 NOTE — Assessment & Plan Note (Signed)
Left face/otic without facial weakness - to reduce prednisone to 30 every day x 3 more days, add gabapentin 100 tid, and triam paste for lip and tongue prn use,  to f/u any worsening symptoms or concerns

## 2023-05-29 NOTE — Assessment & Plan Note (Signed)
BP Readings from Last 3 Encounters:  05/28/23 132/80  05/25/23 (!) 185/99  04/26/23 (!) 138/91   Stable, pt to continue medical treatment hct 12.5 every day, losartan 25 qd

## 2023-05-29 NOTE — Assessment & Plan Note (Signed)
Last vitamin D Lab Results  Component Value Date   VD25OH 31.79 10/05/2022   Low, to start oral replacement

## 2023-05-29 NOTE — Assessment & Plan Note (Signed)
Pt counseled to quit, pt not ready 

## 2023-05-29 NOTE — Assessment & Plan Note (Signed)
Lab Results  Component Value Date   HGBA1C 6.1 04/09/2023   Stable, pt to continue current medical treatment  - diet, wt control

## 2023-06-01 ENCOUNTER — Encounter: Payer: Self-pay | Admitting: Physical Therapy

## 2023-06-01 ENCOUNTER — Ambulatory Visit (INDEPENDENT_AMBULATORY_CARE_PROVIDER_SITE_OTHER): Payer: Medicare Other | Admitting: Physical Therapy

## 2023-06-01 DIAGNOSIS — R600 Localized edema: Secondary | ICD-10-CM | POA: Diagnosis not present

## 2023-06-01 DIAGNOSIS — M25562 Pain in left knee: Secondary | ICD-10-CM | POA: Diagnosis not present

## 2023-06-01 DIAGNOSIS — M6281 Muscle weakness (generalized): Secondary | ICD-10-CM

## 2023-06-01 DIAGNOSIS — M79662 Pain in left lower leg: Secondary | ICD-10-CM | POA: Diagnosis not present

## 2023-06-01 DIAGNOSIS — G8929 Other chronic pain: Secondary | ICD-10-CM

## 2023-06-01 DIAGNOSIS — R262 Difficulty in walking, not elsewhere classified: Secondary | ICD-10-CM

## 2023-06-01 NOTE — Therapy (Addendum)
OUTPATIENT PHYSICAL THERAPY TREATMENT / DISCHARGE  Patient Name: Frances Nelson MRN: [id number] DOB:[birth year], 67 y.o., female Today's Date: 06/01/2023  END OF SESSION:  PT End of Session - 06/01/23 0927     Visit Number 26    Number of Visits 29    Date for PT Re-Evaluation 06/11/23    Authorization Type MCR    Authorization Time Period KX required past 15 visits    Progress Note Due on Visit 29    PT Start Time 0924    PT Stop Time 1005    PT Time Calculation (min) 41 min    Activity Tolerance Patient tolerated treatment well   shingles   Behavior During Therapy WFL for tasks assessed/performed                    Past Medical History:  Diagnosis Date   Arthritis    Asthma    COPD (chronic obstructive pulmonary disease) (HCC)    GERD (gastroesophageal reflux disease)    Hypertension    Pneumonia    Past Surgical History:  Procedure Laterality Date   ABDOMINAL HYSTERECTOMY     APPENDECTOMY     HERNIA REPAIR     Umbilical x 3   KNEE ARTHROSCOPY W/ MENISCAL REPAIR Left 12/2022   TONSILLECTOMY     TRANSFORAMINAL LUMBAR INTERBODY FUSION (TLIF) WITH PEDICLE SCREW FIXATION 1 LEVEL N/A 05/18/2022   Procedure: Lumbar Four-Five Open Laminectomy/Transforaminal Lumbar Interbody Fusion/Posterolateral fusion;  Surgeon: Jadene Pierini, MD;  Location: [facility] OR;  Service: Neurosurgery;  Laterality: N/A;   Patient Active Problem List   Diagnosis Date Noted   Shingles outbreak 05/29/2023   Microhematuria 04/09/2023   Bilateral shoulder pain 04/09/2023   Urethritis 10/05/2022   External otitis of left ear 10/05/2022   Spondylolisthesis of lumbar region 05/18/2022   Smoker 04/08/2022   Deviated nasal septum 04/08/2022   Bilateral pain of leg and foot 04/08/2022   Follicular acne 04/06/2022   Baker cyst, left 04/06/2022   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   Depression 10/04/2021   Left knee pain 10/04/2021   Chronic pain  04/09/2021   Pelvic pain 04/09/2021   HLD (hyperlipidemia) 04/09/2021   Vitamin D deficiency 04/09/2021   Estrogen deficiency 04/09/2021   Hyperglycemia 04/01/2021   Chronic cough 12/30/2020   GERD (gastroesophageal reflux disease) 11/06/2019   Bilateral leg edema 10/16/2019   Pleuritic chest pain 10/16/2019   Hypertension 10/02/2019   Arthralgia 06/28/2018   Sinusitis 06/28/2018   Renal cyst 05/03/2018   Syncope 05/03/2018   Stress and adjustment reaction 05/03/2018   History of tobacco use 05/02/2018   Primary osteoarthritis of both hands 04/10/2018   Primary osteoarthritis of both knees 04/10/2018   Primary osteoarthritis of both feet 04/10/2018   Achilles tendinitis 04/02/2018   Screening for breast cancer 03/15/2018   Positive ANA (antinuclear antibody) 03/15/2018   Screening for colon cancer 03/15/2018   Rash 03/15/2018   Achilles tendon pain 11/13/2017   Cervical myelopathy with cervical radiculopathy (HCC) 11/13/2017   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   Allergic rhinitis 06/19/2017   Edema 12/24/2015   Carpal tunnel syndrome, right 12/16/2015   Hypercalcemia 11/11/2015   Pain in both upper extremities 11/11/2015   Drug reaction 10/28/2015   Muscle spasms of both lower extremities 10/28/2015   Nerve pain 10/28/2015   Chronic asthmatic bronchitis with acute exacerbation (HCC) 09/06/2015   Environmental and seasonal allergies 07/17/2014    PCP: Jonny Ruiz,  Len Blalock MD  REFERRING PROVIDER: Kathryne Hitch, MD  REFERRING DIAG: [phone] (ICD-10-CM) - Status post arthroscopic surgery of left knee M76.60 (ICD-10-CM) - Achilles tendon pain  THERAPY DIAG:  Chronic pain of left knee  Pain in left lower leg  Muscle weakness (generalized)  Localized edema  Difficulty in walking, not elsewhere classified  Rationale for Evaluation and Treatment: Rehabilitation  ONSET DATE: Surgery 12/21/2022 on Lt knee  SUBJECTIVE:   SUBJECTIVE STATEMENT: Pt indicated Friday  waking up with issues related to form of shingles that led to pain issues in other areas with inner ear troubles.  Saw ED Friday about those complaints and has made some improvements since.  Pt indicated feeling off balance and impacted still from those complaints.   PERTINENT HISTORY: History of 05/2022 lumbar surgery, GERD, COPD, HTN, history of arthritis  PAIN:  NPRS scale: arrival Lt knee: 4/10    Lt ankle:  6/10 Pain location: Lt knee, Lt achilles Pain description: constant, throbbing,  Aggravating factors: household cleaning, WB, stairs Relieving factors: nothing specific   PRECAUTIONS: None  WEIGHT BEARING RESTRICTIONS: No  FALLS:  Has patient fallen in last 6 months? 1 fall without medical attention.   LIVING ENVIRONMENT: Lives in: House/apartment Stairs: multistory with bedroom upstairs but has moved bed downstairs.   Rail on Lt side going up.   OCCUPATION: Retired   PLOF: Independent, walking for exercise (5-6 miles/day in the past until 2012).  Dog walking   PATIENT GOALS: Reduce pain, move better, walk better without cane.   OBJECTIVE: (objective measures completed at initial evaluation unless otherwise dated)   PATIENT SURVEYS:  04/30/2023: FOTO update:   40.2  03/26/2023:  FOTO update:  44  02/16/2023 FOTO intake:  38  predicted:  54  COGNITION: 02/16/2023 Overall cognitive status: WFL    SENSATION: 02/16/2023 No specific check today  EDEMA:  02/16/2023 Lt leg edema noted from knee down compared to Lt.   MUSCLE LENGTH: 02/16/2023 No specific testing  POSTURE:  02/16/2023 Reduced lumbar lordosis in standing, Weight shift off Lt leg to Rt.   PALPATION: 02/16/2023 Tenderness around swollen Lt ankle, Lt knee to light touch.   LOWER EXTREMITY ROM:   ROM Right 02/16/2023 Left 02/16/2023 Left 02/21/2023 Left 03/02/2023 Left 03/26/2023 Left 04/30/2023 Left 05/17/2023  Hip flexion         Hip extension         Hip abduction         Hip adduction         Hip  internal rotation         Hip external rotation         Knee flexion 125 92 AROM in supine heel slide c pain PROM 95/100 grossly assesed in sitting.  90 AROM in supine heel slide c pain 96 AROM in supine heel slide c pain 98 AROM in heel slide  104 AAROM in heel slide    Knee extension 0 0 AROM in quad set supine  0 AROM in seated quad set  0 AROM in supine   Ankle dorsiflexion      0 5  Ankle plantarflexion      45 39  Ankle inversion         Ankle eversion          (Blank rows = not tested)  LOWER EXTREMITY MMT:  MMT Right 02/16/2023 Left 02/16/2023 Left 03/13/2023 Right 03/26/2023 Left 03/26/2023 Left 04/10/2023 Left 04/30/2023  Hip flexion 5/5 4/5  4+/5  Hip extension         Hip abduction         Hip adduction         Hip internal rotation         Hip external rotation         Knee flexion 5/5 4/5 4/5  4+/5    Knee extension 4/5 3+/5 4-/5 5/5 52.9, 52 lbs  4-/5  15.9, 16.5 lbs with pain  4/5 21.4, 25.9 lbs  Ankle dorsiflexion 5/5 3+/5 c pain 4/5  4+/5 5/5   Ankle plantarflexion  2+/5 c pain (unable to perform standing PF) 2+/5 c pain (unable to perform standing PF)  2+/5  3/5 (1 rep in standing single leg PF ,pain noted)  Ankle inversion 5/5 3+/5 c pain 4/5 c pain  4+/5    Ankle eversion 5/5 3+/5 c pain 3+/5 c pain  4/5     (Blank rows = not tested)  LOWER EXTREMITY SPECIAL TESTS:  02/16/2023 No specific testing today.   FUNCTIONAL TESTS:  05/11/2023: TUG independent:  15.41, 17.3 seconds   04/30/2023: Rt SLS 6 seconds Lt SLS 5 seconds  TUG independent:  17.76 seconds  04/16/2023:   Lt SLS: 4 seconds Rt SLS:   7 seconds  03/26/2023:  18 inch chair transfer: 2 tries required  02/16/2023 18 inch chair transfer: multiple tries required c noted deviation to Rt leg Lt SLS:  < 3 seconds Rt SLS: < 3 seconds  GAIT: 02/16/2023 Mod independent ambulation c SPC in Rt UE, decreased stance on Lt leg, decreased toe off progression noted.                                                                                                                                                                          TODAY'S TREATMENT                                                                             DATE: 05/28/2023 Therapeutic Exercise: Nustep  lvl 6 12:45  mins UE/LE Seated LAQ Lt leg 4 lbs 2 x 15 with end range pauses each direction Seated SLR with quad set 2 x 10 , performed bilaterally  Seated PF/DF x 20 bilateral   Modalities: Vaso Lt ankle, medium compression 34 deg , supine w/ LE elevation  TODAY'S TREATMENT  DATE: 05/22/2023 Therapeutic Exercise: Nustep  lvl 6 12:45  mins UE/LE Seated LAQ Lt knee AROM, supine heel slide AROM x 10 each  TherActivity ( to improve ambulation, transfers, stair naviation) Forward step up 6 inch step with bilaterally hand assist on bar Rt leg x 10, Lt leg x 10 on 4  Leg press double leg  62 lbs x 15,  single leg 2 x 15  31 lbs bilaterally   Neuro Re-ed Tandem stance on floor 1 min x 1 bilateral, on foam 1 min x 1 bilateral with occasional HHA   Modalities: Vaso Lt ankle, medium compression 34 deg , supine w/ LE elevation   TODAY'S TREATMENT                                                                             DATE:  05/18/23  Pt seen for aquatic therapy today.  Treatment took place in water 3.5-4.75 ft in depth at the [facility] pool. Temp of water was 91.  Pt entered/exited the pool via stairs with hand rail.   Pt requires the buoyancy and hydrostatic pressure of water for support, and to offload joints by unweighting joint load by at least 50 % in navel deep water and by at least 75-80% in chest to neck deep water.  Viscosity of the water is needed for resistance of strengthening. Water current perturbations provides challenge to standing balance requiring increased core activation. Aquatic PT exercises Sidestepping  length of pool 3 round trips with mini squat Forward walking  3 round trips backward walking 3 round trips March walking 3 round trips with water dumbells Tandem walk 3 round trips with water dumbells Leg swings hip abd/add X 20 bilat with UE support with UE support Leg swings hip flexion/extension X 20 bilat Gastroc stretch 30 sec X 3 off of first step Step ups onto first step of pool X10 bilat with UE support Heel raises 2X10 with UE support off of first step Squats X15 with UE support off of first step Hip circles CW and CCW X 15 each bilat 1/4 squat with shoulder extension X15 with kickboard 1/4 lunge with push/pull kickboard 2X15 Lunge step to pool wall, touch wall, then step back, X 10 bilat Monster walks 3 round trips with dumbells Seated on pool chair and cycling and hip abd/add 3 min total   PATIENT EDUCATION:  03/26/2023  Education details: HEP update Person educated: Patient Education method: Programmer, multimedia, Demonstration, Verbal cues, and Handouts Education comprehension: verbalized understanding, returned demonstration, and verbal cues required  HOME EXERCISE PROGRAM: Access Code: [email or web address] URL: [email or web address] Date: 03/26/2023 Prepared by: Chyrel Masson  Exercises - Supine Heel Slide (Mirrored)  - 3-5 x daily - 7 x weekly - 1-2 sets - 10 reps - 2 hold - Seated Long Arc Quad (Mirrored)  - 3-5 x daily - 7 x weekly - 1-2 sets - 10 reps - 2 hold - Seated Gastroc Stretch with Strap (Mirrored)  - 2-3 x daily - 7 x weekly - 1 sets - 3 reps - 30 hold - Seated Quad Set (Mirrored)  - 3-5 x daily - 7 x weekly - 1 sets - 10 reps - 5 hold - Seated  Straight Leg Heel Taps  - 1-2 x daily - 7 x weekly - 2-3 sets - 10-15 reps - 2-3 hold - Long Sitting Eccentric Ankle Plantar Flexion with Resistance  - 1-2 x daily - 7 x weekly - 3 sets - 10 reps - Long Sitting Ankle Eversion with Resistance  - 1 x daily - 7 x weekly - 1-2 sets - 10-15 reps - Long Sitting Ankle  Inversion with Resistance  - 1 x daily - 7 x weekly - 1-2 sets - 10-15 reps - Seated Ankle Dorsiflexion with Resistance  - 1 x daily - 7 x weekly - 1-2 sets - 10-15 reps  ASSESSMENT:  CLINICAL IMPRESSION: Good tolerance to aquatic session considering her shingles outbreak causing widespread overall pain.   OBJECTIVE IMPAIRMENTS: Abnormal gait, decreased activity tolerance, decreased balance, decreased coordination, decreased endurance, decreased mobility, difficulty walking, decreased ROM, decreased strength, hypomobility, increased edema, impaired perceived functional ability, impaired flexibility, improper body mechanics, postural dysfunction, and pain.   ACTIVITY LIMITATIONS: carrying, lifting, bending, sitting, standing, squatting, sleeping, stairs, transfers, bed mobility, bathing, toileting, dressing, and locomotion level  PARTICIPATION LIMITATIONS: meal prep, cleaning, laundry, interpersonal relationship, driving, shopping, and community activity  PERSONAL FACTORS:  History of 05/2022 lumbar surgery, GERD, COPD, HTN, history of arthritis, multiple treatment areas current  are also affecting patient's functional outcome.   REHAB POTENTIAL: Fair to good  CLINICAL DECISION MAKING: Evolving/moderate complexity  EVALUATION COMPLEXITY: Moderate   GOALS: Goals reviewed with patient? Yes  SHORT TERM GOALS: (target date for Short term goals are 3 weeks 03/09/2023)   1.  Patient will demonstrate independent use of home exercise program to maintain progress from in clinic treatments.  Goal status: Met   LONG TERM GOALS: (target dates for all long term goals are 10 weeks  06/11/2023 )   1. Patient will demonstrate/report pain at worst less than or equal to 2/10 to facilitate minimal limitation in daily activity secondary to pain symptoms.  Goal status: on going 05/11/2023   2. Patient will demonstrate independent use of home exercise program to facilitate ability to maintain/progress  functional gains from skilled physical therapy services.  Goal status:  on going 05/11/2023   3. Patient will demonstrate FOTO outcome > or = 54 % to indicate reduced disability due to condition.  Goal status:on going 05/11/2023   4.  Patient will demonstrate Lt LE MMT 5/5 throughout to faciltiate usual transfers, stairs, squatting at [facility] for daily life.   Goal status: on going 05/11/2023   5.  Patient will demonstrate ability to ascend/descend stairs reciprocal gait pattern with single hand rail assist.  Goal status: on going 05/11/2023   6.  Patient will demonstrate Lt knee AROM 0-110 deg to facilitate ability to perform transfers, ambulation and other activity in daily life.  Goal status: on going 05/11/2023   7.  Patient will demonstrate ability to ambulation community distances > 500 ft.  Goal Status: on going 05/11/2023  8. Patient will demonstrate TUG independent or with cane < 14 seconds.   Goal status: on going 05/11/2023   PLAN:  PT FREQUENCY: 1-2x/week  PT DURATION: 6 weeks  (added 2 weeks for additional time)  PLANNED INTERVENTIONS: Therapeutic exercises, Therapeutic activity, Neuro Muscular re-education, Balance training, Gait training, Patient/Family education, Joint mobilization, Stair training, DME instructions, Dry Needling, Electrical stimulation, Traction, Cryotherapy, vasopneumatic deviceMoist heat, Taping, Ultrasound, Ionotophoresis 4mg /ml Dexamethasone, and aquatic therapy, Manual therapy.  All included unless contraindicated  PLAN FOR NEXT SESSION: Return to activity  as able based off other medical troubles.   Ivery Quale, PT, DPT 06/01/23 9:29 AM  PHYSICAL THERAPY DISCHARGE SUMMARY  Visits from Start of Care: 26  Current functional level related to goals / functional outcomes: See note   Remaining deficits: See note   Education / Equipment: HEP  Patient goals were partially met. Patient is being discharged due to not returning since the  last visit.  Chyrel Masson, PT, DPT, OCS, ATC 07/12/23  12:11 PM

## 2023-06-08 ENCOUNTER — Telehealth: Payer: Self-pay

## 2023-06-08 NOTE — Telephone Encounter (Signed)
Transition Care Management Follow-up Telephone Call Date of discharge and from where: 05/25/2023 [facility] How have you been since you were released from the hospital? Patient stated her symptoms have not improved much. She still has significant pain and the rash has not improved very much. Suggested patient contact her PCP via MyChart. Patient stated she was able to do so unassisted. Any questions or concerns? No  Items Reviewed: Did the pt receive and understand the discharge instructions provided? Yes  Medications obtained and verified? Yes  Other? No  Any new allergies since your discharge? No  Dietary orders reviewed? Yes Do you have support at home? Yes   Follow up appointments reviewed:  PCP Hospital f/u appt confirmed? No  Scheduled to see Cecille Po, MD on 05/28/2023 @ [facility] [place]. Specialist Hospital f/u appt confirmed? No  Scheduled to see  on  @ . Are transportation arrangements needed? No  If their condition worsens, is the pt aware to call PCP or go to the Emergency Dept.? Yes Was the patient provided with contact information for the PCP's office or ED? Yes Was to pt encouraged to call back with questions or concerns? Yes   Emanuell Morina [facility]  [facility], [facility] Guide Direct Dial: [phone]  Website: [email or web address]

## 2023-06-09 ENCOUNTER — Encounter: Payer: Self-pay | Admitting: Internal Medicine

## 2023-06-11 MED ORDER — HYDROCODONE-ACETAMINOPHEN 7.5-325 MG PO TABS: 1.0000 | ORAL_TABLET | Freq: Four times a day (QID) | ORAL | 0 refills | Status: DC | PRN

## 2023-06-13 ENCOUNTER — Encounter: Payer: Self-pay | Admitting: Dermatology

## 2023-06-18 NOTE — Telephone Encounter (Signed)
Please call pt and explain that we were out of the office for the past 5 days. If she's still symptomatic we can add her on to the end of today or tomrrow morning.

## 2023-06-23 ENCOUNTER — Other Ambulatory Visit: Payer: Self-pay | Admitting: Internal Medicine

## 2023-06-25 ENCOUNTER — Other Ambulatory Visit: Payer: Self-pay | Admitting: Dermatology

## 2023-06-25 ENCOUNTER — Other Ambulatory Visit: Payer: Self-pay

## 2023-06-28 ENCOUNTER — Ambulatory Visit: Payer: Medicare Other | Admitting: Emergency Medicine

## 2023-06-28 ENCOUNTER — Encounter: Payer: Self-pay | Admitting: Emergency Medicine

## 2023-06-28 ENCOUNTER — Other Ambulatory Visit: Payer: Self-pay | Admitting: Emergency Medicine

## 2023-06-28 VITALS — BP 120/78 | HR 83 | Ht 64.0 in | Wt 213.8 lb

## 2023-06-28 DIAGNOSIS — J45909 Unspecified asthma, uncomplicated: Secondary | ICD-10-CM | POA: Diagnosis not present

## 2023-06-28 DIAGNOSIS — J4489 Other specified chronic obstructive pulmonary disease: Secondary | ICD-10-CM

## 2023-06-28 DIAGNOSIS — F17210 Nicotine dependence, cigarettes, uncomplicated: Secondary | ICD-10-CM

## 2023-06-28 DIAGNOSIS — F17200 Nicotine dependence, unspecified, uncomplicated: Secondary | ICD-10-CM

## 2023-06-28 MED ORDER — IPRATROPIUM-ALBUTEROL 0.5-2.5 (3) MG/3ML IN SOLN: 3.0000 mL | Freq: Four times a day (QID) | RESPIRATORY_TRACT | 3 refills | Status: DC

## 2023-06-28 NOTE — Patient Instructions (Signed)
We will try again to order DuoNeb for you.  You should use 1 nebulizer treatment 4 times daily on a schedule.  Will try to run this through Medicare part B and see if this helps with the insurance coverage Continue to work hard on decreasing your cigarette smoking. Follow with APP in 6 months Follow-up with Dr. Delton Coombes in 1 year, sooner if you have any problems.

## 2023-06-28 NOTE — Assessment & Plan Note (Signed)
Discussed tobacco use and cessation efforts with her today.  She has cut down to 1 pack daily.  Support her in this.

## 2023-06-28 NOTE — Assessment & Plan Note (Signed)
She has a medication deductible on getting her on any steady BD therapy has been very difficult.  Not clear to me that her DuoNeb was run through Medicare part B.  I will try to reorder it.  If successful then we will use it 4 times daily on a schedule, continue albuterol as needed.

## 2023-06-28 NOTE — Progress Notes (Signed)
Subjective:    Patient ID: Frances Nelson, female    DOB: [birth year], 67 y.o.   MRN: [id number]  COPD She complains of cough and shortness of breath. There is no wheezing. Pertinent negatives include no ear pain, fever, headaches, postnasal drip, rhinorrhea, sneezing, sore throat or trouble swallowing. Her past medical history is significant for COPD.      ROV 04/11/22 --follow-up visit pleasant 67 year old woman, active smoker (1.5 packs/day) with associated COPD as well as chronic cough.  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  Currently managed on Singulair, has albuterol available to use, takes bout 2x a day.  We tried her on Spiriva in the past but was not well covered by her insurance. She able to exert but does get SOB w stairs, longer walks. Some cough in the am, occasionally will wake up at night. Thick mucous, clear, no blood.   ROV 06/28/2023 --follow-up visit for 67 year old woman who is an active smoker (1.5 packs/day) with COPD, chronic cough due to this as well as GERD and chronic rhinitis.  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  We have been managing her on Singulair, Nasonex, Zyrtec, Prilosec twice daily.  She was not on any scheduled BD therapy due to cost and availability, was started on DuoNeb 4 times daily at her last visit in March 2024 but she never got this.  She has a lot of pain, has zoster affecting L face and scalp. Also a lot of leg pain. She uses albuterol about 2x a day. She does have exertional SOB w stairs. She is smoking about 1 pk/day. She tried to get Chantix but the co-pay was $600. She apparently has a medication deductible of $8500.     Review of Systems  Constitutional:  Negative for fever and unexpected weight change.  HENT:  Positive for congestion. Negative for dental problem, ear pain, nosebleeds, postnasal drip, rhinorrhea, sinus  pressure, sneezing, sore throat and trouble swallowing.   Eyes:  Negative for redness and itching.  Respiratory:  Positive for cough and shortness of breath. Negative for chest tightness and wheezing.   Cardiovascular:  Negative for palpitations and leg swelling.  Gastrointestinal:  Negative for nausea and vomiting.  Genitourinary:  Negative for dysuria.  Musculoskeletal:  Negative for joint swelling.  Skin:  Negative for rash.  Neurological:  Negative for headaches.  Hematological:  Does not bruise/bleed easily.  Psychiatric/Behavioral:  Negative for dysphoric mood. The patient is not nervous/anxious.        Objective:   Physical Exam Vitals:   06/28/23 1502 06/28/23 1504  BP: 120/78 120/78  Pulse: 83 83  SpO2: 96% 96%  Weight:  213 lb 12.8 oz (97 kg)  Height:  5\' 4"  (1.626 m)     Gen: Pleasant, overweight woman, in no distress,  normal affect  ENT: No lesions,  mouth clear,  oropharynx clear, no postnasal drip, hoarse voice  Neck: No JVD, no stridor  Lungs: No use of accessory muscles, distant clear  Cardiovascular: RRR, heart sounds normal, no murmur or gallops, no peripheral edema  Musculoskeletal: No deformities, no cyanosis or clubbing.   Neuro: alert, non focal  Skin: Warm, no lesions or rash      Assessment & Plan:  COPD with asthma (HCC) She has a medication deductible on getting her on any steady BD therapy has been very difficult.  Not clear to  me that her DuoNeb was run through Medicare part B.  I will try to reorder it.  If successful then we will use it 4 times daily on a schedule, continue albuterol as needed.  Smoker Discussed tobacco use and cessation efforts with her today.  She has cut down to 1 pack daily.  Support her in this.    Levy Pupa, MD, PhD 06/28/2023, 3:26 PM [facility] [phone] or if no answer [phone]

## 2023-06-29 ENCOUNTER — Ambulatory Visit: Payer: Medicare Other | Admitting: Physical Therapy

## 2023-07-03 ENCOUNTER — Telehealth: Payer: Self-pay

## 2023-07-03 NOTE — Telephone Encounter (Signed)
*  Pulm  Pharmacy Patient Advocate Encounter   Received notification from CoverMyMeds that prior authorization for Ipratropium-Albuterol 0.5-2.5 (3)MG/3ML solution  is required/requested.   Insurance verification completed.   The patient is insured through [facility] .   Per test claim: PA required; PA submitted to above mentioned insurance via CoverMyMeds Key/confirmation #/EOC [id number] Status is pending

## 2023-07-04 NOTE — Telephone Encounter (Signed)
Pharmacy Patient Advocate Encounter  Received notification from [facility] that Prior Authorization for  Ipratropium-Albuterol 0.5-2.5 (3)MG/3ML solution     has been DENIED.  Full denial letter will be uploaded to the media tab. See denial reason below.   PA #/Case ID/Reference #:

## 2023-07-06 ENCOUNTER — Ambulatory Visit: Payer: Medicare Other | Admitting: Physical Therapy

## 2023-07-06 MED ORDER — IPRATROPIUM-ALBUTEROL 0.5-2.5 (3) MG/3ML IN SOLN: 3.0000 mL | Freq: Four times a day (QID) | RESPIRATORY_TRACT | 3 refills | Status: DC

## 2023-07-06 NOTE — Addendum Note (Signed)
Addended by: Christen Butter on: 07/06/2023 12:59 PM   Modules accepted: Orders

## 2023-07-06 NOTE — Telephone Encounter (Signed)
I have sent new rx to pharm with directions for filing under part B medicare and included dx code

## 2023-07-30 ENCOUNTER — Encounter: Payer: Self-pay | Admitting: Dermatology

## 2023-07-30 ENCOUNTER — Ambulatory Visit (INDEPENDENT_AMBULATORY_CARE_PROVIDER_SITE_OTHER): Payer: Medicare Other | Admitting: Dermatology

## 2023-07-30 VITALS — BP 130/86

## 2023-07-30 DIAGNOSIS — L739 Follicular disorder, unspecified: Secondary | ICD-10-CM | POA: Diagnosis not present

## 2023-07-30 DIAGNOSIS — L299 Pruritus, unspecified: Secondary | ICD-10-CM

## 2023-07-30 DIAGNOSIS — B0229 Other postherpetic nervous system involvement: Secondary | ICD-10-CM

## 2023-07-30 DIAGNOSIS — L719 Rosacea, unspecified: Secondary | ICD-10-CM

## 2023-07-30 MED ORDER — IVERMECTIN 1 % EX CREA: 1.0000 | TOPICAL_CREAM | Freq: Every morning | CUTANEOUS | 4 refills | Status: DC

## 2023-07-30 NOTE — Progress Notes (Signed)
   Follow-Up Visit   Subjective  Frances Nelson is a 68 y.o. 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 She was prescribed topical Ivermectin  and Adapalene  but was advised not use any topical medications on her face while she had shingles. She is not currently taking doxycycline . She is still having some pain from shingles.    The following portions of the chart were reviewed this encounter and updated as appropriate: medications, allergies, medical history  Review of Systems:  No other skin or systemic complaints except as noted in HPI or Assessment and Plan.  Objective  Well appearing patient in no apparent distress; mood and affect are within normal limits.  A focused examination was performed of the following areas: Face   Relevant exam findings are noted in the Assessment and Plan.    Assessment & Plan     1. Rosacea with Folliculitis Assessment: Patient previously diagnosed with rosacea and folliculitis, showing improvement with doxycycline , ivermectin  cream, and adapalene  before treatment was interrupted due to Ramsay Hunt syndrome. 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  Plan:   Restart doxycycline  100 mg PO daily immediately.   In 3 weeks, restart ivermectin  cream topically every morning.   After 1 week of ivermectin  use, add adapalene  topically 3 nights per week.   Avoid clindamycin .     If issues arise with prescription, consider using specialty pharmacy [facility] in [place].   Follow-up in 4 months after all medications have been used for 3 months.  2. 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  Plan:   Monitor for resolution of postherpetic neuralgia.   No active interventions at this time as antiviral treatment is completed.  3. Pruritus Assessment: Patient reports intermittent itchiness on the back, previously treated with clobetasol .  Plan:   Use clobetasol  topically twice daily for 2 weeks, then discontinue.   Reassess at follow-up appointment.     Return in about 4 months (around 11/27/2023) for Rosacea follow up.  I, Roseline Hutchinson, CMA, am acting as scribe for [employer], DO .   Documentation: I have reviewed the above documentation for accuracy and completeness, and I agree with the above.  Delon Lenis, DO

## 2023-07-30 NOTE — Patient Instructions (Addendum)
 Hello Frances Nelson,  Thank you for visiting us  today. Your dedication to managing your condition and improving your health is greatly appreciated. Below is a summary of the key instructions from today's consultation:  Medications:   Doxycycline : Restart taking Doxycycline  once daily, effective immediately.   Ivermectin  Cream: Begin application every morning after 3 weeks.   Adapalene : Resume usage three nights a week (Monday, Wednesday, Friday), starting one week after initiating Ivermectin .   Clobetasol : Apply twice daily for itchiness on the back for 2 weeks, then pause.  Pharmacy and Prescriptions:   Your Ivermectin  prescription will be forwarded to [facility] in [place]. Expect a call and text from them with further instructions.   Utilize the specialty pharmacy for enhanced coverage under your Medicare with [facility] insurance.  Follow-up and Communication:   Please adhere to the prescribed treatments for three months until our next scheduled visit.   Should you encounter any issues or have questions, communicate through MyChart. Assistance with MyChart is available at the front desk.  Additional Notes:   Discontinue Clindamycin ; proceed only with the treatments specified above.   Anticipate noticeable improvements and a reduction in symptoms within the next 2 to 3 weeks.  We are eager to witness your progress and are here to support you at every step. Should you have any questions or concerns before our next meeting, please do not hesitate to reach out.  Warm regards,  Dr. Delon Lenis D[facility]at[facility]  Important Information  Due to recent changes in healthcare laws, you may see results of your pathology and/or laboratory studies on MyChart before the doctors have had a chance to review them. We understand that in some cases there may be results that are confusing or concerning to you. Please understand that not all results are received at the same time and often the  doctors may need to interpret multiple results in order to provide you with the best plan of care or course of treatment. Therefore, we ask that you please give us  2 business days to thoroughly review all your results before contacting the office for clarification. Should we see a critical lab result, you will be contacted sooner.   If You Need Anything After Your Visit  If you have any questions or concerns for your doctor, please call our main line at [phone] If no one answers, please leave a voicemail as directed and we will return your call as soon as possible. Messages left after 4 pm will be answered the following business day.   You may also send us  a message via MyChart. We typically respond to MyChart messages within 1-2 business days.  For prescription refills, please ask your pharmacy to contact our office. Our fax number is [phone].  If you have an urgent issue when the clinic is closed that cannot wait until the next business day, you can page your doctor at the number below.    Please note that while we do our best to be available for urgent issues outside of office hours, we are not available 24/7.   If you have an urgent issue and are unable to reach us , you may choose to seek medical care at your doctor's office, retail clinic, urgent care center, or emergency room.  If you have a medical emergency, please immediately call 911 or go to the emergency department. In the event of inclement weather, please call our main line at [phone] for an update on the status of any delays or closures.  Dermatology  Medication Tips: Please keep the boxes that topical medications come in in order to help keep track of the instructions about where and how to use these. Pharmacies typically print the medication instructions only on the boxes and not directly on the medication tubes.   If your medication is too expensive, please contact our office at [phone] or send us  a message  through MyChart.   We are unable to tell what your co-pay for medications will be in advance as this is different depending on your insurance coverage. However, we may be able to find a substitute medication at lower cost or fill out paperwork to get insurance to cover a needed medication.   If a prior authorization is required to get your medication covered by your insurance company, please allow us  1-2 business days to complete this process.  Drug prices often vary depending on where the prescription is filled and some pharmacies may offer cheaper prices.  The website www.goodrx.com contains coupons for medications through different pharmacies. The prices here do not account for what the cost may be with help from insurance (it may be cheaper with your insurance), but the website can give you the price if you did not use any insurance.  - You can print the associated coupon and take it with your prescription to the pharmacy.  - You may also stop by our office during regular business hours and pick up a GoodRx coupon card.  - If you need your prescription sent electronically to a different pharmacy, notify our office through [facility] or by phone at [phone]

## 2023-08-01 ENCOUNTER — Ambulatory Visit: Payer: Medicare Other | Admitting: Orthopaedic Surgery

## 2023-08-22 ENCOUNTER — Encounter: Payer: Self-pay | Admitting: Orthopaedic Surgery

## 2023-08-22 ENCOUNTER — Ambulatory Visit (INDEPENDENT_AMBULATORY_CARE_PROVIDER_SITE_OTHER): Payer: Medicare Other | Admitting: Orthopaedic Surgery

## 2023-08-22 ENCOUNTER — Telehealth: Payer: Self-pay | Admitting: Radiology

## 2023-08-22 DIAGNOSIS — M25562 Pain in left knee: Secondary | ICD-10-CM

## 2023-08-22 DIAGNOSIS — G8929 Other chronic pain: Secondary | ICD-10-CM

## 2023-08-22 NOTE — Progress Notes (Signed)
 The patient comes in today for continued follow-up as a relates to chronic left knee pain.  A MRI of her left knee last year showed some increased uptake in subcutaneous tissue around the proximal aspect of the patella more of just some inflamed adipose tissue.  There is also moderate degenerative changes in the medial compartment of the knee but no meniscal tear.  She did have a steroid injection in July and that knee of last year and she said that her out-of-pocket cost is about $300.  She still ambulates using a cane and has daily left knee pain and discomfort.  My exam most of her pain is over the superior pole of the patella as well as the medial compartment of her left knee with good range of motion and no effusion but pain throughout the arc of motion and pain with just palpating her knee.  At this point it is worth seeing if her insurance will cover hyaluronic acid to treat the chronic pain of osteoarthritis of her left knee.  We will see if we can get her approved and ordered for this.  This patient is diagnosed with osteoarthritis of the knee(s).    Radiographs show evidence of joint space narrowing, osteophytes, subchondral sclerosis and/or subchondral cysts.  This patient has knee pain which interferes with functional and activities of daily living.    This patient has experienced inadequate response, adverse effects and/or intolerance with conservative treatments such as acetaminophen , NSAIDS, topical creams, physical therapy or regular exercise, knee bracing and/or weight loss.   This patient has experienced inadequate response or has a contraindication to intra articular steroid injections for at least 3 months.   This patient is not scheduled to have a total knee replacement within 6 months of starting treatment with viscosupplementation.

## 2023-08-22 NOTE — Telephone Encounter (Signed)
 Left knee gel injection approval

## 2023-09-18 NOTE — Telephone Encounter (Signed)
 VOB has been submitted for Monovisc, left knee.

## 2023-09-22 ENCOUNTER — Other Ambulatory Visit: Payer: Self-pay | Admitting: Emergency Medicine

## 2023-10-11 ENCOUNTER — Encounter: Payer: Self-pay | Admitting: Internal Medicine

## 2023-10-11 ENCOUNTER — Ambulatory Visit (INDEPENDENT_AMBULATORY_CARE_PROVIDER_SITE_OTHER)

## 2023-10-11 ENCOUNTER — Ambulatory Visit (INDEPENDENT_AMBULATORY_CARE_PROVIDER_SITE_OTHER): Payer: Medicare Other | Admitting: Internal Medicine

## 2023-10-11 VITALS — BP 124/80 | HR 85 | Temp 98.1°F | Ht 64.0 in | Wt 220.0 lb

## 2023-10-11 DIAGNOSIS — M25562 Pain in left knee: Secondary | ICD-10-CM | POA: Diagnosis not present

## 2023-10-11 DIAGNOSIS — M25512 Pain in left shoulder: Secondary | ICD-10-CM

## 2023-10-11 DIAGNOSIS — E538 Deficiency of other specified B group vitamins: Secondary | ICD-10-CM

## 2023-10-11 DIAGNOSIS — E7800 Pure hypercholesterolemia, unspecified: Secondary | ICD-10-CM | POA: Diagnosis not present

## 2023-10-11 DIAGNOSIS — F17200 Nicotine dependence, unspecified, uncomplicated: Secondary | ICD-10-CM

## 2023-10-11 DIAGNOSIS — M25572 Pain in left ankle and joints of left foot: Secondary | ICD-10-CM | POA: Diagnosis not present

## 2023-10-11 DIAGNOSIS — G8929 Other chronic pain: Secondary | ICD-10-CM | POA: Diagnosis not present

## 2023-10-11 DIAGNOSIS — E66812 Obesity, class 2: Secondary | ICD-10-CM

## 2023-10-11 DIAGNOSIS — M4316 Spondylolisthesis, lumbar region: Secondary | ICD-10-CM

## 2023-10-11 DIAGNOSIS — R739 Hyperglycemia, unspecified: Secondary | ICD-10-CM | POA: Diagnosis not present

## 2023-10-11 DIAGNOSIS — E559 Vitamin D deficiency, unspecified: Secondary | ICD-10-CM

## 2023-10-11 DIAGNOSIS — M48062 Spinal stenosis, lumbar region with neurogenic claudication: Secondary | ICD-10-CM

## 2023-10-11 DIAGNOSIS — Z6837 Body mass index (BMI) 37.0-37.9, adult: Secondary | ICD-10-CM

## 2023-10-11 LAB — URINALYSIS, ROUTINE W REFLEX MICROSCOPIC
Bilirubin Urine: NEGATIVE
Ketones, ur: NEGATIVE
Leukocytes,Ua: NEGATIVE
Nitrite: NEGATIVE
Specific Gravity, Urine: 1.02 (ref 1.000–1.030)
Total Protein, Urine: NEGATIVE
Urine Glucose: NEGATIVE
Urobilinogen, UA: 0.2 (ref 0.0–1.0)
pH: 6 (ref 5.0–8.0)

## 2023-10-11 LAB — CBC WITH DIFFERENTIAL/PLATELET
Basophils Absolute: 0.1 10*3/uL (ref 0.0–0.1)
Basophils Relative: 0.8 % (ref 0.0–3.0)
Eosinophils Absolute: 0.3 10*3/uL (ref 0.0–0.7)
Eosinophils Relative: 2.9 % (ref 0.0–5.0)
HCT: 44.4 % (ref 36.0–46.0)
Hemoglobin: 14.9 g/dL (ref 12.0–15.0)
Lymphocytes Relative: 20 % (ref 12.0–46.0)
Lymphs Abs: 1.8 10*3/uL (ref 0.7–4.0)
MCHC: 33.5 g/dL (ref 30.0–36.0)
MCV: 92.5 fl (ref 78.0–100.0)
Monocytes Absolute: 0.7 10*3/uL (ref 0.1–1.0)
Monocytes Relative: 7.7 % (ref 3.0–12.0)
Neutro Abs: 6.2 10*3/uL (ref 1.4–7.7)
Neutrophils Relative %: 68.6 % (ref 43.0–77.0)
Platelets: 333 10*3/uL (ref 150.0–400.0)
RBC: 4.8 Mil/uL (ref 3.87–5.11)
RDW: 13.3 % (ref 11.5–15.5)
WBC: 9 10*3/uL (ref 4.0–10.5)

## 2023-10-11 LAB — MICROALBUMIN / CREATININE URINE RATIO
Creatinine,U: 70.1 mg/dL
Microalb Creat Ratio: 19.6 mg/g (ref 0.0–30.0)
Microalb, Ur: 1.4 mg/dL (ref 0.0–1.9)

## 2023-10-11 LAB — LIPID PANEL
Cholesterol: 207 mg/dL — ABNORMAL HIGH (ref 0–200)
HDL: 50 mg/dL (ref >39.00)
LDL Cholesterol: 115 mg/dL — ABNORMAL HIGH (ref 0–99)
NonHDL: 156.99
Total CHOL/HDL Ratio: 4
Triglycerides: 208 mg/dL — ABNORMAL HIGH (ref 0.0–149.0)
VLDL: 41.6 mg/dL — ABNORMAL HIGH (ref 0.0–40.0)

## 2023-10-11 LAB — BASIC METABOLIC PANEL WITH GFR
BUN: 16 mg/dL (ref 6–23)
CO2: 33 meq/L — ABNORMAL HIGH (ref 19–32)
Calcium: 10.5 mg/dL (ref 8.4–10.5)
Chloride: 99 meq/L (ref 96–112)
Creatinine, Ser: 0.66 mg/dL (ref 0.40–1.20)
GFR: 90.47 mL/min (ref >60.00)
Glucose, Bld: 97 mg/dL (ref 70–99)
Potassium: 4.4 meq/L (ref 3.5–5.1)
Sodium: 138 meq/L (ref 135–145)

## 2023-10-11 LAB — HEPATIC FUNCTION PANEL
ALT: 33 U/L (ref 0–35)
AST: 28 U/L (ref 0–37)
Albumin: 4.3 g/dL (ref 3.5–5.2)
Alkaline Phosphatase: 92 U/L (ref 39–117)
Bilirubin, Direct: 0.1 mg/dL (ref 0.0–0.3)
Total Bilirubin: 0.6 mg/dL (ref 0.2–1.2)
Total Protein: 7.4 g/dL (ref 6.0–8.3)

## 2023-10-11 MED ORDER — PHENTERMINE HCL 30 MG PO CAPS: 30.0000 mg | ORAL_CAPSULE | ORAL | 5 refills | Status: DC

## 2023-10-11 NOTE — Progress Notes (Signed)
 Patient ID: Frances Nelson, female   DOB: [birth year], 68 y.o.   MRN: [id number]        Chief Complaint: follow up left shoulder pain, left knee pain, left ankle pain, chronic LBP s/p fusion, leg swelling, urinary incontinence, obesity       HPI:  Frances Nelson is a 68 y.o. female here with c/o chronic overall possibly worsening including pain to the the joints above, Pt continues to have recurring LBP without change in severity, bowel or bladder change, fever, wt loss,  worsening LE pain/numbness/weakness, gait change or falls.  Does have persistent very mild swellng legs left > right.  Also unable to lose wt.  Denies urinary symptoms such as dysuria, frequency, urgency, flank pain, hematuria or n/v, fever, chills. But does have ongoing incontincence, has seen urology and declines f/u.          Wt Readings from Last 3 Encounters:  10/11/23 220 lb (99.8 kg)  06/28/23 213 lb 12.8 oz (97 kg)  05/28/23 210 lb (95.3 kg)   BP Readings from Last 3 Encounters:  10/11/23 124/80  07/30/23 130/86  06/28/23 120/78         Past Medical History:  Diagnosis Date   Arthritis    Asthma    COPD (chronic obstructive pulmonary disease) (HCC)    GERD (gastroesophageal reflux disease)    Hypertension    Pneumonia    Past Surgical History:  Procedure Laterality Date   ABDOMINAL HYSTERECTOMY     APPENDECTOMY     HERNIA REPAIR     Umbilical x 3   KNEE ARTHROSCOPY W/ MENISCAL REPAIR Left 12/2022   TONSILLECTOMY     TRANSFORAMINAL LUMBAR INTERBODY FUSION (TLIF) WITH PEDICLE SCREW FIXATION 1 LEVEL N/A 05/18/2022   Procedure: Lumbar Four-Five Open Laminectomy/Transforaminal Lumbar Interbody Fusion/Posterolateral fusion;  Surgeon: Jadene Pierini, MD;  Location: [facility] OR;  Service: Neurosurgery;  Laterality: N/A;    reports that she has been smoking cigarettes. She has a 49 pack-year smoking history. She has never been exposed to tobacco smoke. She has never used smokeless tobacco. She reports that she  does not drink alcohol and does not use drugs. family history includes Allergies in her son and son; Angina in her father; Asthma in her son; Brain cancer in her brother; Cancer in her brother; Kidney disease in her brother and sister; Leukemia in her sister; Parkinson's disease in her father. Allergies  Allergen Reactions   Symbicort [Budesonide-Formoterol Fumarate] Other (See Comments)    Patient reported dizziness, nausea, headaches and sore throat while using Symbicort 160. Reported on 09/03/17   Tramadol Hives   Celebrex [Celecoxib]     Bleeding.     Chocolate Flavoring Agent (Non-Screening)    Ciprofloxacin Nausea And Vomiting    Headache, shaking.    Egg-Derived Engineer, water (Non-Screening)     Unknown   Lisinopril Cough    Pt off med for a week, some improvement   Lyrica [Pregabalin]     "Made me out of it."    Penicillins     Patient preference   Wellbutrin [Bupropion]    Codeine Palpitations   Current Outpatient Medications on File Prior to Visit  Medication Sig Dispense Refill   acetaminophen (TYLENOL) 650 MG CR tablet Take 1,300 mg by mouth as needed.     acidophilus (RISAQUAD) CAPS capsule Take 1 capsule by mouth in the morning.     Adapalene (DIFFERIN) 0.3 % gel Apply 1 Application  topically at bedtime. Apply a pea sized amount to face and behind ears 3 nights weekly 45 g 2   B Complex Vitamins (VITAMIN-B COMPLEX PO) Take by mouth daily.     Calcium-Vitamins C & D (CALCIUM/C/D PO) Take 1 tablet by mouth in the morning.     cetirizine (ZYRTEC) 10 MG tablet Take 10 mg by mouth in the morning.     Cholecalciferol (HM VITAMIN D3 PO) Take 5,000 Units by mouth in the morning.     clindamycin (CLEOCIN T) 1 % SWAB APPLY 1 APPLICATION TOPICALLY IN THE MORNING. WIPE ON FACE AND BEHIND EARS EVERY MORNING 60 each 2   clobetasol cream (TEMOVATE) 0.05 % Apply 1 Application topically 2 (two) times daily. Apply to affected areas 2 x daily for 2 weeks then stop. Use as  needed (Patient taking differently: Apply 1 Application topically as needed. Apply to affected areas 2 x daily for 2 weeks then stop. Use as needed) 45 g 0   COLLAGEN PO Take by mouth. Collagen Peptides     doxycycline (VIBRAMYCIN) 50 MG capsule Take 1 capsule (50 mg total) by mouth daily. With food and plenty of fluid 30 capsule 3   DULoxetine (CYMBALTA) 30 MG capsule TAKE 1 TAB BY MOUTH IN THE AM AND 2 IN THE PM 270 capsule 4   ezetimibe (ZETIA) 10 MG tablet Take 1 tablet (10 mg total) by mouth daily. 90 tablet 3   Homeopathic Products (ZICAM ALLERGY RELIEF NA) Place into the nose.     hydrochlorothiazide (HYDRODIURIL) 12.5 MG tablet TAKE 1 TABLET BY MOUTH EVERY DAY (Patient taking differently: Take 12.5 mg by mouth as needed.) 90 tablet 2   ipratropium-albuterol (DUONEB) 0.5-2.5 (3) MG/3ML SOLN Inhale 3 mLs into the lungs in the morning, at noon, in the evening, and at bedtime. 360 mL 3   lidocaine (LIDODERM) 5 % Place 1 patch onto the skin daily. Remove & Discard patch within 12 hours or as directed by MD 30 patch 0   losartan (COZAAR) 25 MG tablet TAKE 1 TABLET (25 MG TOTAL) BY MOUTH DAILY. 90 tablet 2   MELATONIN PO Take 5 mg by mouth at bedtime.     meloxicam (MOBIC) 15 MG tablet Take 0.5 tablets (7.5 mg total) by mouth daily. (Patient taking differently: Take 7.5 mg by mouth as needed.) 60 tablet 0   methocarbamol (ROBAXIN) 750 MG tablet TAKE 1 TABLET (750 MG TOTAL) BY MOUTH EVERY 6 (SIX) HOURS AS NEEDED FOR MUSCLE SPASMS (NOT COVERED) 120 tablet 2   mometasone (NASONEX) 50 MCG/ACT nasal spray Place 2 sprays into the nose every evening.     montelukast (SINGULAIR) 10 MG tablet TAKE 1 TABLET BY MOUTH EVERYDAY AT BEDTIME 90 tablet 3   Multiple Vitamins-Minerals (MULTIVITAMIN WITH MINERALS) tablet Take 1 tablet by mouth in the morning.     omeprazole (PRILOSEC) 40 MG capsule TAKE 1 CAPSULE (40 MG TOTAL) BY MOUTH IN THE MORNING AND AT BEDTIME. 180 capsule 3   topiramate (TOPAMAX) 50 MG tablet  Take 1 tablet (50 mg total) by mouth 2 (two) times daily. 120 tablet 5   traMADol (ULTRAM) 50 MG tablet Take 1-2 tablets (50-100 mg total) by mouth every 6 (six) hours as needed. 30 tablet 0   Current Facility-Administered Medications on File Prior to Visit  Medication Dose Route Frequency Provider Last Rate Last Admin   triamcinolone acetonide (KENALOG) 10 MG/ML injection 10 mg  10 mg Intradermal Once Terri Piedra, DO  ROS:  All others reviewed and negative.  Objective        PE:  BP 124/80 (BP Location: Right Arm, Patient Position: Sitting, Cuff Size: Normal)   Pulse 85   Temp 98.1 F (36.7 C) (Oral)   Ht 5\' 4"  (1.626 m)   Wt 220 lb (99.8 kg)   SpO2 93%   BMI 37.76 kg/m                 Constitutional: Pt appears in NAD               HENT: Head: NCAT.                Right Ear: External ear normal.                 Left Ear: External ear normal.                Eyes: . Pupils are equal, round, and reactive to light. Conjunctivae and EOM are normal               Nose: without d/c or deformity               Neck: Neck supple. Gross normal ROM               Cardiovascular: Normal rate and regular rhythm.                 Pulmonary/Chest: Effort normal and breath sounds without rales or wheezing.                Abd:  Soft, NT, ND, + BS, no organomegaly               Neurological: Pt is alert. At baseline orientation, motor grossly intact               Skin: Skin is warm. No rashes, no other new lesions, LE edema - none               Psychiatric: Pt behavior is normal without agitation   Micro: none  Cardiac tracings I have personally interpreted today:  none  Pertinent Radiological findings (summarize): none   Lab Results  Component Value Date   WBC 9.0 10/11/2023   HGB 14.9 10/11/2023   HCT 44.4 10/11/2023   PLT 333.0 10/11/2023   GLUCOSE 97 10/11/2023   CHOL 207 (H) 10/11/2023   TRIG 208.0 (H) 10/11/2023   HDL 50.00 10/11/2023   LDLDIRECT 143.0 10/05/2022    LDLCALC 115 (H) 10/11/2023   ALT 33 10/11/2023   AST 28 10/11/2023   NA 138 10/11/2023   K 4.4 10/11/2023   CL 99 10/11/2023   CREATININE 0.66 10/11/2023   BUN 16 10/11/2023   CO2 33 (H) 10/11/2023   TSH 1.78 10/11/2023   HGBA1C 6.1 10/11/2023   MICROALBUR 1.4 10/11/2023   Assessment/Plan:  Frances Nelson is a 68 y.o. White or Caucasian [1] female with  has a past medical history of Arthritis, Asthma, COPD (chronic obstructive pulmonary disease) (HCC), GERD (gastroesophageal reflux disease), Hypertension, and Pneumonia.  Chronic pain Uncontrolled ,  referral for: Dr Jean Rosenthal - left shoulder, Dr Victorino Dike  - left ankle, and Physical Therapy got the back, knees and ankle  Hyperglycemia Lab Results  Component Value Date   HGBA1C 6.1 10/11/2023   Stable, pt to continue current medical treatment  - diet, wt control   HLD (hyperlipidemia) . Lab Results  Component Value Date  LDLCALC 115 (H) 10/11/2023   Improved with zetia, declines statin, declines repatha for now   Vitamin D deficiency Last vitamin D Lab Results  Component Value Date   VD25OH 44.88 10/11/2023   Stable, cont oral replacement   Smoker Pt still smoking, pt not ready to quit  Obesity Mild to mod, for phentermine 30 mg every day, to f/u any worsening symptoms or concerns  Followup: Return in about 6 months (around 04/12/2024).  Oliver Barre, MD 10/13/2023 9:31 PM [facility]

## 2023-10-11 NOTE — Patient Instructions (Addendum)
 Please take all new medication as prescribed  - the phentermine 30 mg per day  Please continue all other medications as before.  Please have the pharmacy call with any other refills you may need.  Please continue your efforts at being more active, low cholesterol diet, and weight control.  You are otherwise up to date with prevention measures today.  Please keep your appointments with your specialists as you may have planned  You will be contacted regarding the referral for: Dr Jean Rosenthal - left shoulder, Dr Victorino Dike  - left ankle, and Physical Therapy got the back, knees and ankle  Please go to the XRAY Department in the first floor for the x-ray testing  Please go to the LAB at the blood drawing area for the tests to be done  You will be contacted by phone if any changes need to be made immediately.  Otherwise, you will receive a letter about your results with an explanation, but please check with MyChart first.  Please make an Appointment to return in 6 months, or sooner if needed

## 2023-10-12 LAB — TSH: TSH: 1.78 u[IU]/mL (ref 0.35–5.50)

## 2023-10-12 LAB — HEMOGLOBIN A1C: Hgb A1c MFr Bld: 6.1 % (ref 4.6–6.5)

## 2023-10-12 LAB — VITAMIN B12: Vitamin B-12: 483 pg/mL (ref 211–911)

## 2023-10-12 LAB — VITAMIN D 25 HYDROXY (VIT D DEFICIENCY, FRACTURES): VITD: 44.88 ng/mL (ref 30.00–100.00)

## 2023-10-12 NOTE — Progress Notes (Signed)
 Frances Nelson D.[facility] [address] [place] [address] Phone: [phone]   Assessment and Plan:     1. Chronic left shoulder pain (Primary) 2. Impingement syndrome of left shoulder -Chronic with exacerbation, initial visit - Patient with chronic left shoulder pain, worsening restriction in ROM, which is leading to surrounding pain through left trapezius, left neck, radicular symptoms into left upper extremity.  I suspect radicular symptoms are most likely due to muscular strains and overuse, however if we improve patient's shoulder pain and ROM, and there is still not improvement in radicular symptoms, would further evaluate C-spine - Recent left shoulder x-ray reviewed with patient showing mild degenerative changes in glenohumeral and AC joint - Patient has not had significant improvement in the past with NSAID courses with chronic knee and back pain, and patient has GI discomfort with prescription NSAID use, so we elected to proceed with subacromial CSI.  Tolerated well per note below.  CSI may temporarily increase blood pressure in patient with past medical history of hypertension - Start HEP and physical therapy for shoulder with goal of improving ROM  15 additional minutes spent for educating Therapeutic Home Exercise Program.  This included exercises focusing on stretching, strengthening, with focus on eccentric aspects.   Long term goals include an improvement in range of motion, strength, endurance as well as avoiding reinjury. Patient's frequency would include in 1-2 times a day, 3-5 times a week for a duration of 6-12 weeks. Proper technique shown and discussed handout in great detail with ATC.  All questions were discussed and answered.    Procedure: Subacromial Injection Side: Left  Risks explained and consent was given verbally. The site was cleaned with alcohol prep. A steroid injection was performed from posterior approach using 2mL  of 1% lidocaine without epinephrine and 1mL of kenalog 40mg /ml. This was well tolerated and resulted in symptomatic relief.  Needle was removed, hemostasis achieved, and post injection instructions were explained.   Pt was advised to call or return to clinic if these symptoms worsen or fail to improve as anticipated.   3. Chronic pain of left knee 4. Chronic bilateral low back pain with left-sided sciatica -Chronic with exacerbation, subsequent visit - Continued chronic pain pain status post lumbar fusion.  Still consistent with flares of osteoarthritis leading to pain and deconditioning - Recommend starting physical therapy.  Referral sent    Pertinent previous records reviewed include internal medicine note 10/11/2023, orthopedic surgery note 08/22/2023, knee/shoulder/ankle x-rays from 10/11/2023  Follow Up: 3 to 4 weeks for reevaluation.  Could consider advanced imaging left shoulder versus medication course versus alternative CSI   Subjective:   I, Frances Nelson am a scribe for Dr. Jean Rosenthal.    Chief Complaint: left shoulder pain   HPI:   10/15/2023 Patient is a 68 year old female with left shoulder pain. Patient states has been having some dizziness.  Pain keeps her up at night. Try to sleep on back as much as possible. Wakes up lying on her left side. Worse when she gets up in the morning but the pain is consistent all day.  Duration?about a month now Did you have an Injury to cause this pain? no Taking Medication for pain? Tylenol arthritis, tramadol Numbness or Tingling?yes second finger completely numb Does the pain Radiate? Yes (from neck down to fingers) Altered gait or use? yes ROM/ impairment of movement? All ranges are painful   Relevant Historical Information: Hypertension, GERD, COPD, history of  positive ANA, status post lumbar fusion  Additional pertinent review of systems negative.   Current Outpatient Medications:    acetaminophen (TYLENOL) 650 MG CR tablet, Take  1,300 mg by mouth as needed., Disp: , Rfl:    acidophilus (RISAQUAD) CAPS capsule, Take 1 capsule by mouth in the morning., Disp: , Rfl:    Adapalene (DIFFERIN) 0.3 % gel, Apply 1 Application topically at bedtime. Apply a pea sized amount to face and behind ears 3 nights weekly, Disp: 45 g, Rfl: 2   B Complex Vitamins (VITAMIN-B COMPLEX PO), Take by mouth daily., Disp: , Rfl:    Calcium-Vitamins C & D (CALCIUM/C/D PO), Take 1 tablet by mouth in the morning., Disp: , Rfl:    cetirizine (ZYRTEC) 10 MG tablet, Take 10 mg by mouth in the morning., Disp: , Rfl:    Cholecalciferol (HM VITAMIN D3 PO), Take 5,000 Units by mouth in the morning., Disp: , Rfl:    clindamycin (CLEOCIN T) 1 % SWAB, APPLY 1 APPLICATION TOPICALLY IN THE MORNING. WIPE ON FACE AND BEHIND EARS EVERY MORNING, Disp: 60 each, Rfl: 2   clobetasol cream (TEMOVATE) 0.05 %, Apply 1 Application topically 2 (two) times daily. Apply to affected areas 2 x daily for 2 weeks then stop. Use as needed (Patient taking differently: Apply 1 Application topically as needed. Apply to affected areas 2 x daily for 2 weeks then stop. Use as needed), Disp: 45 g, Rfl: 0   COLLAGEN PO, Take by mouth. Collagen Peptides, Disp: , Rfl:    doxycycline (VIBRAMYCIN) 50 MG capsule, Take 1 capsule (50 mg total) by mouth daily. With food and plenty of fluid, Disp: 30 capsule, Rfl: 3   DULoxetine (CYMBALTA) 30 MG capsule, TAKE 1 TAB BY MOUTH IN THE AM AND 2 IN THE PM, Disp: 270 capsule, Rfl: 4   ezetimibe (ZETIA) 10 MG tablet, Take 1 tablet (10 mg total) by mouth daily., Disp: 90 tablet, Rfl: 3   Homeopathic Products (ZICAM ALLERGY RELIEF NA), Place into the nose., Disp: , Rfl:    hydrochlorothiazide (HYDRODIURIL) 12.5 MG tablet, TAKE 1 TABLET BY MOUTH EVERY DAY (Patient taking differently: Take 12.5 mg by mouth as needed.), Disp: 90 tablet, Rfl: 2   ipratropium-albuterol (DUONEB) 0.5-2.5 (3) MG/3ML SOLN, Inhale 3 mLs into the lungs in the morning, at noon, in the  evening, and at bedtime., Disp: 360 mL, Rfl: 3   lidocaine (LIDODERM) 5 %, Place 1 patch onto the skin daily. Remove & Discard patch within 12 hours or as directed by MD, Disp: 30 patch, Rfl: 0   losartan (COZAAR) 25 MG tablet, TAKE 1 TABLET (25 MG TOTAL) BY MOUTH DAILY., Disp: 90 tablet, Rfl: 2   MELATONIN PO, Take 5 mg by mouth at bedtime., Disp: , Rfl:    meloxicam (MOBIC) 15 MG tablet, Take 0.5 tablets (7.5 mg total) by mouth daily. (Patient taking differently: Take 7.5 mg by mouth as needed.), Disp: 60 tablet, Rfl: 0   methocarbamol (ROBAXIN) 750 MG tablet, TAKE 1 TABLET (750 MG TOTAL) BY MOUTH EVERY 6 (SIX) HOURS AS NEEDED FOR MUSCLE SPASMS (NOT COVERED), Disp: 120 tablet, Rfl: 2   mometasone (NASONEX) 50 MCG/ACT nasal spray, Place 2 sprays into the nose every evening., Disp: , Rfl:    montelukast (SINGULAIR) 10 MG tablet, TAKE 1 TABLET BY MOUTH EVERYDAY AT BEDTIME, Disp: 90 tablet, Rfl: 3   Multiple Vitamins-Minerals (MULTIVITAMIN WITH MINERALS) tablet, Take 1 tablet by mouth in the morning., Disp: , Rfl:  omeprazole (PRILOSEC) 40 MG capsule, TAKE 1 CAPSULE (40 MG TOTAL) BY MOUTH IN THE MORNING AND AT BEDTIME., Disp: 180 capsule, Rfl: 3   phentermine 30 MG capsule, Take 1 capsule (30 mg total) by mouth every morning., Disp: 30 capsule, Rfl: 5   topiramate (TOPAMAX) 50 MG tablet, Take 1 tablet (50 mg total) by mouth 2 (two) times daily., Disp: 120 tablet, Rfl: 5   traMADol (ULTRAM) 50 MG tablet, Take 1-2 tablets (50-100 mg total) by mouth every 6 (six) hours as needed., Disp: 30 tablet, Rfl: 0  Current Facility-Administered Medications:    triamcinolone acetonide (KENALOG) 10 MG/ML injection 10 mg, 10 mg, Intradermal, Once, Terri Piedra, DO   Objective:     Vitals:   10/15/23 1031  BP: 126/60  Pulse: 94  SpO2: 95%  Weight: 220 lb (99.8 kg)  Height: 5\' 4"  (1.626 m)      Body mass index is 37.76 kg/m.    Physical Exam:     Gen: Appears well, nad, nontoxic and  pleasant Neuro:sensation intact, strength is 5/5 with df/pf/inv/ev, muscle tone wnl Skin: no suspicious lesion or defmority Psych: A&O, appropriate mood and affect  Left shoulder:  No deformity, swelling or muscle wasting No scapular winging FF 150, abd 110, int 20, ext 70 TTP trapezius, coracoid, biceps groove, humerus, deltoid, cervical spine NTTP over the [place], clavicle, ac  Special testing limited due to limited ROM Neg ant drawer, sulcus sign, apprehension Negative Spurling's test bilat FROM of neck    Electronically signed by:  Frances Nelson [facility] 11:16 AM 10/15/23

## 2023-10-13 ENCOUNTER — Encounter: Payer: Self-pay | Admitting: Internal Medicine

## 2023-10-13 DIAGNOSIS — E669 Obesity, unspecified: Secondary | ICD-10-CM | POA: Insufficient documentation

## 2023-10-13 NOTE — Assessment & Plan Note (Signed)
 Mild to mod, for phentermine 30 mg every day, to f/u any worsening symptoms or concerns

## 2023-10-13 NOTE — Assessment & Plan Note (Signed)
.   Lab Results  Component Value Date   LDLCALC 115 (H) 10/11/2023   Improved with zetia, declines statin, declines repatha for now

## 2023-10-13 NOTE — Assessment & Plan Note (Signed)
 Last vitamin D Lab Results  Component Value Date   VD25OH 44.88 10/11/2023   Stable, cont oral replacement

## 2023-10-13 NOTE — Assessment & Plan Note (Signed)
 Pt still smoking, pt not ready to quit

## 2023-10-13 NOTE — Assessment & Plan Note (Signed)
 Uncontrolled ,  referral for: Dr Jean Rosenthal - left shoulder, Dr Victorino Dike  - left ankle, and Physical Therapy got the back, knees and ankle

## 2023-10-13 NOTE — Assessment & Plan Note (Signed)
 Lab Results  Component Value Date   HGBA1C 6.1 10/11/2023   Stable, pt to continue current medical treatment  - diet, wt control

## 2023-10-15 ENCOUNTER — Ambulatory Visit: Admitting: Sports Medicine

## 2023-10-15 VITALS — BP 126/60 | HR 94 | Ht 64.0 in | Wt 220.0 lb

## 2023-10-15 DIAGNOSIS — M25562 Pain in left knee: Secondary | ICD-10-CM | POA: Diagnosis not present

## 2023-10-15 DIAGNOSIS — G8929 Other chronic pain: Secondary | ICD-10-CM | POA: Diagnosis not present

## 2023-10-15 DIAGNOSIS — M7542 Impingement syndrome of left shoulder: Secondary | ICD-10-CM

## 2023-10-15 DIAGNOSIS — M5442 Lumbago with sciatica, left side: Secondary | ICD-10-CM | POA: Diagnosis not present

## 2023-10-15 DIAGNOSIS — M25512 Pain in left shoulder: Secondary | ICD-10-CM

## 2023-10-15 NOTE — Patient Instructions (Addendum)
 Rotator Cuff Hep Referred to PT Follow up in 3 to 4 weeks.

## 2023-10-16 ENCOUNTER — Encounter: Payer: Self-pay | Admitting: Internal Medicine

## 2023-10-16 NOTE — Therapy (Signed)
 OUTPATIENT PHYSICAL THERAPY SHOULDER EVALUATION   Patient Name: Frances Nelson MRN: [id number] DOB:1955-11-27, 68 y.o., female Today's Date: 10/16/2023  END OF SESSION:   Past Medical History:  Diagnosis Date   Arthritis    Asthma    COPD (chronic obstructive pulmonary disease) (HCC)    GERD (gastroesophageal reflux disease)    Hypertension    Pneumonia    Past Surgical History:  Procedure Laterality Date   ABDOMINAL HYSTERECTOMY     APPENDECTOMY     HERNIA REPAIR     Umbilical x 3   KNEE ARTHROSCOPY W/ MENISCAL REPAIR Left 12/2022   TONSILLECTOMY     TRANSFORAMINAL LUMBAR INTERBODY FUSION (TLIF) WITH PEDICLE SCREW FIXATION 1 LEVEL N/A 05/18/2022   Procedure: Lumbar Four-Five Open Laminectomy/Transforaminal Lumbar Interbody Fusion/Posterolateral fusion;  Surgeon: Jadene Pierini, MD;  Location: [facility] OR;  Service: Neurosurgery;  Laterality: N/A;   Patient Active Problem List   Diagnosis Date Noted   Obesity 10/13/2023   Shingles outbreak 05/29/2023   Microhematuria 04/09/2023   Bilateral shoulder pain 04/09/2023   Urethritis 10/05/2022   External otitis of left ear 10/05/2022   Spondylolisthesis of lumbar region 05/18/2022   Smoker 04/08/2022   Deviated nasal septum 04/08/2022   Bilateral pain of leg and foot 04/08/2022   Follicular acne 04/06/2022   Baker cyst, left 04/06/2022   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   Depression 10/04/2021   Left knee pain 10/04/2021   Chronic pain 04/09/2021   Pelvic pain 04/09/2021   HLD (hyperlipidemia) 04/09/2021   Vitamin D deficiency 04/09/2021   Estrogen deficiency 04/09/2021   Hyperglycemia 04/01/2021   Chronic cough 12/30/2020   GERD (gastroesophageal reflux disease) 11/06/2019   Bilateral leg edema 10/16/2019   Pleuritic chest pain 10/16/2019   Hypertension 10/02/2019   Arthralgia 06/28/2018   Sinusitis 06/28/2018   Renal cyst 05/03/2018   Syncope 05/03/2018   Stress and  adjustment reaction 05/03/2018   History of tobacco use 05/02/2018   Primary osteoarthritis of both hands 04/10/2018   Primary osteoarthritis of both knees 04/10/2018   Primary osteoarthritis of both feet 04/10/2018   Achilles tendinitis 04/02/2018   Screening for breast cancer 03/15/2018   Positive ANA (antinuclear antibody) 03/15/2018   Screening for colon cancer 03/15/2018   Rash 03/15/2018   Achilles tendon pain 11/13/2017   Cervical myelopathy with cervical radiculopathy (HCC) 11/13/2017   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   Allergic rhinitis 06/19/2017   Edema 12/24/2015   Carpal tunnel syndrome, right 12/16/2015   Hypercalcemia 11/11/2015   Pain in both upper extremities 11/11/2015   Drug reaction 10/28/2015   Muscle spasms of both lower extremities 10/28/2015   Nerve pain 10/28/2015   Chronic asthmatic bronchitis with acute exacerbation (HCC) 09/06/2015   Environmental and seasonal allergies 07/17/2014    PCP: Corwin Levins, MD  REFERRING PROVIDER: Richardean Sale, DO  REFERRING DIAG: Chronic left shoulder pain; Chronic pain of left knee; Chronic bilateral low back pain with left-sided sciatica  THERAPY DIAG:  No diagnosis found.  Rationale for Evaluation and Treatment: Rehabilitation  ONSET DATE: ***   SUBJECTIVE SUBJECTIVE STATEMENT: ***  Hand dominance: {MISC; OT HAND [phone]}  PERTINENT HISTORY: ***  PAIN:  Are you having pain? Yes:  NPRS scale: *** Pain location: *** Pain description: *** Aggravating factors: *** Relieving factors: ***  PRECAUTIONS: None  RED FLAGS: None   WEIGHT BEARING RESTRICTIONS: No  FALLS:  Has patient fallen in last 6 months? No  LIVING ENVIRONMENT: Lives with: {[facility] lives with:25569::"lives with their family"} Lives in: {Lives in:25570} Stairs: {opstairs:27293} Has following equipment at home: {Assistive devices:23999}  OCCUPATION: ***  PLOF: Independent  PATIENT GOALS:***   OBJECTIVE:   Note: Objective measures were completed at Evaluation unless otherwise noted. PATIENT SURVEYS:  {rehab surveys:24030:a}  COGNITION: Overall cognitive status: Within functional limits for tasks assessed     SENSATION: {sensation:27233}  POSTURE: ***  UPPER EXTREMITY ROM:   Active ROM Right eval Left eval  Shoulder flexion    Shoulder extension    Shoulder abduction    Shoulder adduction    Shoulder internal rotation    Shoulder external rotation    Elbow flexion    Elbow extension    Wrist flexion    Wrist extension    Wrist ulnar deviation    Wrist radial deviation    Wrist pronation    Wrist supination    (Blank rows = not tested)  UPPER EXTREMITY MMT:  MMT Right eval Left eval  Shoulder flexion    Shoulder extension    Shoulder abduction    Shoulder adduction    Shoulder internal rotation    Shoulder external rotation    Middle trapezius    Lower trapezius    Elbow flexion    Elbow extension    Wrist flexion    Wrist extension    Wrist ulnar deviation    Wrist radial deviation    Wrist pronation    Wrist supination    Grip strength (lbs)    (Blank rows = not tested)  LUMBAR ROM:   Active  A/PROM  eval  Flexion   Extension   Right lateral flexion   Left lateral flexion   Right rotation   Left rotation    (Blank rows = not tested)   LOWER EXTREMITY ROM:     {AROM/PROM:27142}  Right eval Left eval  Hip flexion    Hip extension    Hip abduction    Hip adduction    Hip internal rotation    Hip external rotation    Knee flexion    Knee extension    Ankle dorsiflexion    Ankle plantarflexion    Ankle inversion    Ankle eversion     (Blank rows = not tested)   LOWER EXTREMITY MMT:    MMT Right eval Left eval  Hip flexion    Hip extension    Hip abduction    Hip adduction    Hip internal rotation    Hip external rotation    Knee flexion    Knee extension    Ankle dorsiflexion    Ankle plantarflexion    Ankle inversion     Ankle eversion     (Blank rows = not tested)   SHOULDER SPECIAL TESTS: Impingement tests: {shoulder impingement test:25231:a} SLAP lesions: {SLAP lesions:25232} Instability tests: {shoulder instability test:25233} Rotator cuff assessment: {rotator cuff assessment:25234} Biceps assessment: {biceps assessment:25235}  JOINT MOBILITY TESTING:  ***  PALPATION:  ***  TREATMENT  [facility] Adult PT Treatment:                                                DATE: *** ***  PATIENT EDUCATION: Education details: Exam findings, POC, HEP Person educated: Patient Education method: Explanation, Demonstration, Tactile cues, Verbal cues, and Handouts Education comprehension: verbalized understanding, returned demonstration, verbal cues required, tactile cues required, and needs further education  HOME EXERCISE PROGRAM: ***   ASSESSMENT: CLINICAL IMPRESSION: Patient is a 68 y.o. female who was seen today for physical therapy evaluation and treatment for ***.   OBJECTIVE IMPAIRMENTS: {opptimpairments:25111}.   ACTIVITY LIMITATIONS: {activitylimitations:27494}  PARTICIPATION LIMITATIONS: {participationrestrictions:25113}  PERSONAL FACTORS: {Personal factors:25162} are also affecting patient's functional outcome.   REHAB POTENTIAL: {rehabpotential:25112}  CLINICAL DECISION MAKING: {clinical decision making:25114}  EVALUATION COMPLEXITY: {Evaluation complexity:25115}   GOALS: Goals reviewed with patient? Yes  SHORT TERM GOALS: Target date: ***  *** Baseline: Goal status: INITIAL  2.  *** Baseline:  Goal status: INITIAL  3.  *** Baseline:  Goal status: INITIAL  LONG TERM GOALS: Target date: ***  Patient will be I with final HEP to maintain progress from PT. Baseline: HEP provided at eval Goal status: INITIAL  2.  Patient will report QuickDASH </=  ***% in order to indicate an improvement in their functional status Baseline:  Goal status: INITIAL  3.  *** Baseline:  Goal status: INITIAL  4.  *** Baseline:  Goal status: INITIAL   PLAN: PT FREQUENCY: {rehab frequency:25116}  PT DURATION: {rehab duration:25117}  PLANNED INTERVENTIONS: 97164- PT Re-evaluation, 97110-Therapeutic exercises, 97530- Therapeutic activity, [id number]- Neuromuscular re-education, 97535- Self Care, 14782- Manual therapy, [id number]- Gait training, [phone]- Electrical stimulation (unattended), [id number]- Electrical stimulation (manual), and 97033- Ionotophoresis 4mg /ml Dexamethasone  PLAN FOR NEXT SESSION: Review HEP and progress PRN, ***   Rosana Hoes, PT, DPT, LAT, ATC 10/16/23  4:26 PM Phone: [phone] Fax: [phone]

## 2023-10-17 ENCOUNTER — Other Ambulatory Visit: Payer: Self-pay | Admitting: Internal Medicine

## 2023-10-17 MED ORDER — REPATHA SURECLICK 140 MG/ML ~~LOC~~ SOAJ: 140.0000 mg | SUBCUTANEOUS | 3 refills | Status: DC

## 2023-10-17 NOTE — Progress Notes (Deleted)
 NEUROLOGY FOLLOW UP OFFICE NOTE  Frances Nelson [id number]  Subjective:  Frances Nelson is a 68 y.o. year old right-handed female with a medical history of chronic pain, Baker's cyst of left knee, HTN, HLD, vit D deficiency, COPD (smoker), OA, degenerative disease and spinal stenosis of cervical and lumbar spine, depression who we last saw on 04/25/23 for numbness and tingling in her feet.  To briefly review: 08/17/22: Patient's symptoms started with left achilles tendonitis around 2018. She started getting numbness and tingling in both feet around 2019, like they were falling asleep. It has been constant. The symptoms have progressed over the years so that the numbness and tingling are up to the mid calf in the left leg and to lower leg on right. Symptoms have improved some on the right, but always been worse on the left. She endorses chronic back pain that predates symptoms in feet as well. She endorses occasional sciatica. She sometimes feels like the radiation is coming from the big toe up her leg, like someone is sticking her with a needle in her feet.   Sports medicine saw patient and felt symptoms were related to lumbar stenosis, so she was referred to NSGY who did L4-5 open decompression and PSIF on 05/18/22. Patient continues to have pain in back and hips. She is not really having shooting pain going down. The numbness and tingling in her legs has not significantly changed. It wakes her up at night. She endorses leg cramps. She denies bowel or bladder changes and denies saddle anesthesia.   Patient is currently taking Cymbalta 60 mg qhs, which she is unsure if it helps. She has been on it for over a year. She has previously tried gabapentin, but stopped this as she did not think it was helping. She has tried Lyrica but had a bad reaction (cannot remember what the reaction was). She has tried nortriptyline in the past as well. She has tried diclofenac cream, lidocaine cream, biofreeze, and  bengay type of medications. She has had a little success with CBD cream. She also takes Tylenol arthritis.   Patient was referred to PT. She has not yet scheduled this (as of 08/17/22).   Patient also has a lot of issues with her left knee including a Baker's cyst and torn meniscus. She sees sports medicine for this. She is using a cane to walk due to this pain.   Patient has previously seen neurology in the past at [facility]. Patient had a 4 limb EMG done. She was told she had carpal tunnel syndrome, which was odd to her as she did not have symptoms in her hands. She was not sure why they even checked her arms. She does mention that since her back surgery in 05/2022, she has noticed some cramping/locking up of fingers.   Patient saw Dr. Corliss Skains in rheumatology on 03/22/22. Per clinic note: Positive ANA (antinuclear antibody) - 10/03/21: ANA 1:40NH, RF<14, ESR 20, Uric acid 7.5, anti-CCP<16, CRP 1.2 -there is no history of oral ulcers, nasal ulcers, malar rash, photosensitivity, Raynaud's phenomenon or lymphadenopathy.  She has dry mouth and dry eyes symptoms most likely related to smoking.  Over-the-counter products were discussed.  I will obtain following labs today.  Plan: Protein / creatinine ratio, urine, ANA, Anti-DNA antibody, double-stranded, C3 and C4, Sjogrens syndrome-A extractable nuclear antibody, Sjogrens syndrome-B extractable nuclear antibody, Anti-Smith antibody, RNP Antibody    She report any constitutional symptoms like fever, night sweats, anorexia or unintentional weight loss.  EtOH use: None  Restrictive diet? No Family history of neuropathy/myopathy/neurologic disease? Not definitively, but 2 sisters has problems with left leg   10/18/22: Labs were significant for a borderline low B12 (300). I recommended supplementation with 1000 mcg daily. Patient is taking this.   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    Overall, patient's symptoms have not significantly changed. She continues to have numbness and tingling in legs and back pain with shooting pains. She mentions she is now getting a vibration sensation in her right arm after using her electric toothbrush (like it is still in her hand). It is constant, but a different sensation from the numbness and tingling in her legs. This gets worse is she is using her hands. She is not currently using wrist splints for CTS.   Patient is taking Cymbalta 90 mg at night. This helped her sleep better. The split dose did not help.   Patient has not heard from physical therapy.   Of note, patient was started on Topamax for migraines, which seems to be helping per patient.   Patient is seeing sports medicine for a torn meniscus in the left knee. Surgery has been recommended, but patient has not seen a surgeon.   04/25/23: Patient feels her legs (numbness and tingling) is about the same. She has swelling in left > right leg. She is not sure why her legs are swollen, but feels this is contributing to her pain. She has cut down her Cymbalta to 60 mg at bedtime. She did not think 90 mg was any better so cut it back.   Patient is doing physical therapy and water therapy. She thinks the water therapy in particular helps.   In terms of her hands, she did wear braces for a while, but it made her too sweaty, so she stopped. She thinks her symptoms may have improved some. She is having a lot of arthritis, particularly in her left arm. She is planning to talk to ortho about this.   She has not had any recent falls.  Most recent Assessment and Plan (04/25/23): This is Frances Nelson, a 68 y.o. female with: Numbness, tingling, and pain in bilateral lower extremities. 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 This combined with know orthopedic issues (left knee particularly), OA, lumbar  stenosis, and leg swelling are all likely contributing to chronic pain. Unfortunately, patient has had difficulty tolerating or had poor efficacy of many past medications including gabapentin, Lyrica, nortriptlyine, lidocaine cream, diclofenac cream, CBC cream. Cymbalta has worked some. She also gets improvement with water therapy. Numbness and tingling in hands - carpal tunnel syndrome and OA. Improved some with splinting but was not able to tolerate. These symptoms are not as bothersome today. Migraines - stable on Topamax, managed by PCP   Plan: -Continue Cymbalta 60 mg daily -Talk to PCP about leg swelling -Continue to see ortho as planned -Will try pain management again (?[facility]) -Continue PT and water therapy -Fall precautions discussed  Since their last visit: ***  Pain management?***  MEDICATIONS:  Outpatient Encounter Medications as of 10/25/2023  Medication Sig   acetaminophen (TYLENOL) 650 MG CR tablet Take 1,300 mg by mouth as needed.   acidophilus (RISAQUAD) CAPS capsule Take 1 capsule by mouth in the morning.   Adapalene (DIFFERIN) 0.3 % gel Apply 1 Application  topically at bedtime. Apply a pea sized amount to face and behind ears 3 nights weekly   B Complex Vitamins (VITAMIN-B COMPLEX PO) Take by mouth daily.   Calcium-Vitamins C & D (CALCIUM/C/D PO) Take 1 tablet by mouth in the morning.   cetirizine (ZYRTEC) 10 MG tablet Take 10 mg by mouth in the morning.   Cholecalciferol (HM VITAMIN D3 PO) Take 5,000 Units by mouth in the morning.   clindamycin (CLEOCIN T) 1 % SWAB APPLY 1 APPLICATION TOPICALLY IN THE MORNING. WIPE ON FACE AND BEHIND EARS EVERY MORNING   clobetasol cream (TEMOVATE) 0.05 % Apply 1 Application topically 2 (two) times daily. Apply to affected areas 2 x daily for 2 weeks then stop. Use as needed (Patient taking differently: Apply 1 Application topically as needed. Apply to affected areas 2 x daily for 2 weeks then stop. Use as needed)   COLLAGEN  PO Take by mouth. Collagen Peptides   doxycycline (VIBRAMYCIN) 50 MG capsule Take 1 capsule (50 mg total) by mouth daily. With food and plenty of fluid   DULoxetine (CYMBALTA) 30 MG capsule TAKE 1 TAB BY MOUTH IN THE AM AND 2 IN THE PM   Evolocumab (REPATHA SURECLICK) 140 MG/ML SOAJ Inject 140 mg into the skin every 14 (fourteen) days.   ezetimibe (ZETIA) 10 MG tablet Take 1 tablet (10 mg total) by mouth daily.   Homeopathic Products (ZICAM ALLERGY RELIEF NA) Place into the nose.   hydrochlorothiazide (HYDRODIURIL) 12.5 MG tablet TAKE 1 TABLET BY MOUTH EVERY DAY (Patient taking differently: Take 12.5 mg by mouth as needed.)   ipratropium-albuterol (DUONEB) 0.5-2.5 (3) MG/3ML SOLN Inhale 3 mLs into the lungs in the morning, at noon, in the evening, and at bedtime.   lidocaine (LIDODERM) 5 % Place 1 patch onto the skin daily. Remove & Discard patch within 12 hours or as directed by MD   losartan (COZAAR) 25 MG tablet TAKE 1 TABLET (25 MG TOTAL) BY MOUTH DAILY.   MELATONIN PO Take 5 mg by mouth at bedtime.   meloxicam (MOBIC) 15 MG tablet Take 0.5 tablets (7.5 mg total) by mouth daily. (Patient taking differently: Take 7.5 mg by mouth as needed.)   methocarbamol (ROBAXIN) 750 MG tablet TAKE 1 TABLET (750 MG TOTAL) BY MOUTH EVERY 6 (SIX) HOURS AS NEEDED FOR MUSCLE SPASMS (NOT COVERED)   mometasone (NASONEX) 50 MCG/ACT nasal spray Place 2 sprays into the nose every evening.   montelukast (SINGULAIR) 10 MG tablet TAKE 1 TABLET BY MOUTH EVERYDAY AT BEDTIME   Multiple Vitamins-Minerals (MULTIVITAMIN WITH MINERALS) tablet Take 1 tablet by mouth in the morning.   omeprazole (PRILOSEC) 40 MG capsule TAKE 1 CAPSULE (40 MG TOTAL) BY MOUTH IN THE MORNING AND AT BEDTIME.   phentermine 30 MG capsule Take 1 capsule (30 mg total) by mouth every morning.   topiramate (TOPAMAX) 50 MG tablet Take 1 tablet (50 mg total) by mouth 2 (two) times daily.   traMADol (ULTRAM) 50 MG tablet Take 1-2 tablets (50-100 mg total) by  mouth every 6 (six) hours as needed.   Facility-Administered Encounter Medications as of 10/25/2023  Medication   triamcinolone acetonide (KENALOG) 10 MG/ML injection 10 mg    PAST MEDICAL HISTORY: Past Medical History:  Diagnosis Date   Arthritis    Asthma    COPD (chronic obstructive pulmonary disease) (HCC)    GERD (gastroesophageal reflux disease)    Hypertension    Pneumonia     PAST SURGICAL HISTORY: Past Surgical History:  Procedure Laterality Date   ABDOMINAL HYSTERECTOMY     APPENDECTOMY     HERNIA REPAIR     Umbilical x 3   KNEE ARTHROSCOPY W/ MENISCAL REPAIR Left 12/2022   TONSILLECTOMY     TRANSFORAMINAL LUMBAR INTERBODY FUSION (TLIF) WITH PEDICLE SCREW FIXATION 1 LEVEL N/A 05/18/2022   Procedure: Lumbar Four-Five Open Laminectomy/Transforaminal Lumbar Interbody Fusion/Posterolateral fusion;  Surgeon: Jadene Pierini, MD;  Location: [facility] OR;  Service: Neurosurgery;  Laterality: N/A;    ALLERGIES: Allergies  Allergen Reactions   Symbicort [Budesonide-Formoterol Fumarate] Other (See Comments)    Patient reported dizziness, nausea, headaches and sore throat while using Symbicort 160. Reported on 09/03/17   Tramadol Hives   Celebrex [Celecoxib]     Bleeding.     Chocolate Flavoring Agent (Non-Screening)    Ciprofloxacin Nausea And Vomiting    Headache, shaking.    Egg-Derived Engineer, water (Non-Screening)     Unknown   Lisinopril Cough    Pt off med for a week, some improvement   Lyrica [Pregabalin]     "Made me out of it."    Penicillins     Patient preference   Wellbutrin [Bupropion]    Codeine Palpitations    FAMILY HISTORY: Family History  Problem Relation Age of Onset   Angina Father    Parkinson's disease Father    Leukemia Sister    Kidney disease Sister    Kidney disease Brother    Brain cancer Brother        glioblastoma    Cancer Brother    Allergies Son    Asthma Son    Allergies Son     SOCIAL HISTORY: Social  History   Tobacco Use   Smoking status: Every Day    Current packs/day: 1.00    Average packs/day: 1 pack/day for 49.0 years (49.0 ttl pk-yrs)    Types: Cigarettes    Passive exposure: Never   Smokeless tobacco: Never   Tobacco comments:    1 pack a day  Vaping Use   Vaping status: Never Used  Substance Use Topics   Alcohol use: No   Drug use: Never   Social History   Social History Narrative   Right Handed    Lives in a two story home - Lives with son       Are you currently employed ?    What is your current occupation?retired   Do you live at home alone?   Who lives with you?    What type of home do you live in: 1 story or 2 story?     Caffeine 1-2 cups a day      Objective:  Vital Signs:  There were no vitals taken for this visit.  ***  Labs and Imaging review: New results: 10/11/23: HbA1c: 6.1 TSH wnl B12: 483 Vit D wnl  Previously reviewed results: 04/09/23: HbA1c: 6.1 Lipid panel: total cholesterol 228, TG 231, LDL 132   04/05/23: RNP ab: positive ANA negative dsDNA negative C3 and C4 wnl  08/17/22: IFE: no M protein B12: 300 B1: 10   B12 (10/05/22): 846 HbA1c (10/05/22): 6.0 TSH (10/05/22): 1.83   BMP and CBC (05/11/22): unremarkable HbA1c (04/06/22): 5.5 (6.2 is highest in the past) ANA (03/22/22): positive at 1:40 RNP ab (03/22/22): 1.7 (positive) Normal or unremarkable (03/22/22): anti-Sm, SSA, SSB, C3, C4, dsDNA, CCP (10/03/21)   EMG (09/05/22): NCV & EMG Findings: Extensive electrodiagnostic evaluation of the left upper and lower  limb with additional nerve conduction studies of the right lower limb shows: Bilateral sural and superficial peroneal/fibular sensory responses are absent. Left median sensory response shows prolonged distal peak latency (4.3 ms). Left ulnar sensory response is within normal limits. Left tibial (AH) motor response shows prolonged distal onset latency (6.4 ms) and reduced amplitude (1.61 mV). Left peroneal/fibular (EDB),  left median (APB), and left ulnar (ADM) motor responses are within normal limits.  Left H reflex latency is within normal limits. Chronic motor axon loss changes WITH active denervation changes are seen in the left extensor digitorum brevis and abductor hallucis muscles. Chronic motor axon loss changes WITHOUT active denervation changes are seen in the left tibialis anterior, flexor digitorum longus, and gluteus medius muscles.   Impression: This is an abnormal study. 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 Evidence of a left median mononeuropathy at or distal to the wrist, consistent with carpal tunnel syndrome, mild in degree electrically.   Left knee MRI (07/04/22): IMPRESSION: 1. Complex degenerative tear and diminution of the posterior horn of the medial meniscus. 2. Mild osteoarthritis of the medial tibiofemoral compartment.   Lumbar xray (05/18/22): FINDINGS: Again noted are a slight levorotary lumbar scoliosis apex L2, minimal grade 1 L4-5 spondylolisthesis unchanged as well.   Bilateral posterior fusion rods and pedicle screws, laminectomy defect and interbody metallic disc space hardware are again noted at L4-5.   No acute hardware complication or hardware failure are seen.   Osteopenia. There is preservation of the normal lumbar vertebral heights without evidence of fractures.   The lumbar discs are maintained in heights. There is spondylosis. Mild facet hypertrophy at L2-3 inferiorly.   The SI joints are unremarkable, as visualized.   The aorta and common iliac arteries are heavily calcified.   IMPRESSION: 1. No evidence of fractures. 2. Osteopenia and degenerative change. 3. Slight levorotary lumbar  scoliosis apex L2, minimal grade 1 L4-5 spondylolisthesis unchanged. 4. L4-5 dorsal fusion construct with interbody hardware and laminectomy. 5. Aortic atherosclerosis.   MRI lumbar spine (10/25/21): FINDINGS: Segmentation:  5 lumbar vertebra.   Alignment: Mild retrolisthesis L2-3 and L3-4. 4 mm anterolisthesis L4-5   Vertebrae:  Normal bone marrow.  Negative for fracture or mass   Conus medullaris and cauda equina: Conus extends to the T12-L1 level. Conus and cauda equina appear normal.   Paraspinal and other soft tissues: Negative for paraspinous mass or adenopathy   Disc levels:   L1-2: Negative   L2-3: Mild disc degeneration and disc bulging. No significant spinal or foraminal stenosis   L3-4: Mild disc and mild facet degeneration. Negative for disc protrusion or stenosis.   L4-5: 4 mm anterolisthesis. Diffuse disc bulging and moderate to advanced facet degeneration. Moderate spinal stenosis. Moderate subarticular stenosis bilaterally. Right foraminal encroachment with right L4 and L5 nerve root impingement.   L5-S1: Negative   IMPRESSION: 4 mm anterolisthesis L4-5. Moderate spinal stenosis. Moderate subarticular and foraminal stenosis on the right. Moderate left subarticular stenosis.   Cervical spine xray (04/09/20): FINDINGS: There is no evidence of cervical spine fracture or prevertebral soft tissue swelling. Alignment is normal. No other significant bone abnormalities are identified.   IMPRESSION: Negative cervical spine radiographs.   EMG (06/03/20 by Dr. Wynn Banker in PM&R): Normal study of LLE -  no neuropathy or radiculopathy  Assessment/Plan:  This is Frances Nelson, a 68 y.o. female with: ***   Plan: ***  Return to clinic in ***  Total time spent reviewing records, interview, history/exam, documentation, and coordination of care on day of encounter:  *** min  Jacquelyne Balint, MD

## 2023-10-18 ENCOUNTER — Other Ambulatory Visit: Payer: Self-pay | Admitting: Internal Medicine

## 2023-10-18 ENCOUNTER — Ambulatory Visit: Admitting: Physical Therapy

## 2023-10-18 ENCOUNTER — Other Ambulatory Visit: Payer: Self-pay

## 2023-10-18 ENCOUNTER — Encounter: Payer: Self-pay | Admitting: Physical Therapy

## 2023-10-18 DIAGNOSIS — M6281 Muscle weakness (generalized): Secondary | ICD-10-CM

## 2023-10-18 DIAGNOSIS — M25512 Pain in left shoulder: Secondary | ICD-10-CM | POA: Diagnosis not present

## 2023-10-18 DIAGNOSIS — G8929 Other chronic pain: Secondary | ICD-10-CM | POA: Diagnosis not present

## 2023-10-18 MED ORDER — ROSUVASTATIN CALCIUM 10 MG PO TABS: 10.0000 mg | ORAL_TABLET | Freq: Every day | ORAL | 3 refills | Status: DC

## 2023-10-18 NOTE — Telephone Encounter (Signed)
 Ok to contact pt please -  Repatha not covered, so I sent generic for Crestor 10 mg per day  Please also follow lower cholesterol diet

## 2023-10-18 NOTE — Patient Instructions (Signed)
 Access Code: [email or web address] URL: [email or web address] Date: 10/18/2023 Prepared by: Rosana Hoes  Exercises - Gentle Upper Trap Stretch  - 2-3 x daily - 3 reps - 15 secnds hold - Seated Scapular Retraction  - 2-3 x daily - 10 reps - 3 seconds hold

## 2023-10-19 NOTE — Therapy (Signed)
 OUTPATIENT PHYSICAL THERAPY TREATMENT   Patient Name: Frances Nelson MRN: [id number] DOB:12/13/1955, 68 y.o., female Today's Date: 10/22/2023   END OF SESSION:  PT End of Session - 10/22/23 1151     Visit Number 2    Number of Visits 17    Date for PT Re-Evaluation 12/13/23    Authorization Type MCR    Progress Note Due on Visit 10    PT Start Time 1145    PT Stop Time 1230    PT Time Calculation (min) 45 min    Activity Tolerance Patient tolerated treatment well    Behavior During Therapy WFL for tasks assessed/performed              Past Medical History:  Diagnosis Date   Arthritis    Asthma    COPD (chronic obstructive pulmonary disease) (HCC)    GERD (gastroesophageal reflux disease)    Hypertension    Pneumonia    Past Surgical History:  Procedure Laterality Date   ABDOMINAL HYSTERECTOMY     APPENDECTOMY     HERNIA REPAIR     Umbilical x 3   KNEE ARTHROSCOPY W/ MENISCAL REPAIR Left 12/2022   TONSILLECTOMY     TRANSFORAMINAL LUMBAR INTERBODY FUSION (TLIF) WITH PEDICLE SCREW FIXATION 1 LEVEL N/A 05/18/2022   Procedure: Lumbar Four-Five Open Laminectomy/Transforaminal Lumbar Interbody Fusion/Posterolateral fusion;  Surgeon: Jadene Pierini, MD;  Location: [facility] OR;  Service: Neurosurgery;  Laterality: N/A;   Patient Active Problem List   Diagnosis Date Noted   Obesity 10/13/2023   Shingles outbreak 05/29/2023   Microhematuria 04/09/2023   Bilateral shoulder pain 04/09/2023   Urethritis 10/05/2022   External otitis of left ear 10/05/2022   Spondylolisthesis of lumbar region 05/18/2022   Smoker 04/08/2022   Deviated nasal septum 04/08/2022   Bilateral pain of leg and foot 04/08/2022   Follicular acne 04/06/2022   Baker cyst, left 04/06/2022   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   Depression 10/04/2021   Left knee pain 10/04/2021   Chronic pain 04/09/2021   Pelvic pain 04/09/2021   HLD (hyperlipidemia) 04/09/2021    Vitamin D deficiency 04/09/2021   Estrogen deficiency 04/09/2021   Hyperglycemia 04/01/2021   Chronic cough 12/30/2020   GERD (gastroesophageal reflux disease) 11/06/2019   Bilateral leg edema 10/16/2019   Pleuritic chest pain 10/16/2019   Hypertension 10/02/2019   Arthralgia 06/28/2018   Sinusitis 06/28/2018   Renal cyst 05/03/2018   Syncope 05/03/2018   Stress and adjustment reaction 05/03/2018   History of tobacco use 05/02/2018   Primary osteoarthritis of both hands 04/10/2018   Primary osteoarthritis of both knees 04/10/2018   Primary osteoarthritis of both feet 04/10/2018   Achilles tendinitis 04/02/2018   Screening for breast cancer 03/15/2018   Positive ANA (antinuclear antibody) 03/15/2018   Screening for colon cancer 03/15/2018   Rash 03/15/2018   Achilles tendon pain 11/13/2017   Cervical myelopathy with cervical radiculopathy (HCC) 11/13/2017   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   Allergic rhinitis 06/19/2017   Edema 12/24/2015   Carpal tunnel syndrome, right 12/16/2015   Hypercalcemia 11/11/2015   Pain in both upper extremities 11/11/2015   Drug reaction 10/28/2015   Muscle spasms of both lower extremities 10/28/2015   Nerve pain 10/28/2015   Chronic asthmatic bronchitis with acute exacerbation (HCC) 09/06/2015   Environmental and seasonal allergies 07/17/2014    PCP: Corwin Levins, MD  REFERRING PROVIDER: Richardean Sale, DO  REFERRING DIAG: Chronic left shoulder  pain; Chronic pain of left knee; Chronic bilateral low back pain with left-sided sciatica  THERAPY DIAG:  Chronic left shoulder pain  Muscle weakness (generalized)  Rationale for Evaluation and Treatment: Rehabilitation  ONSET DATE: Chronic for a "couple of months"   SUBJECTIVE SUBJECTIVE STATEMENT: Patient reports they changed her statin medication and she is achy all over. She was a little sore after last visit but it did feel a little better. She is still having numbness into the  left hand.    Eval: Patient reports one day she woke up and her left shoulder was hurting and swollen. The pain has pretty much stayed the same since it started. The left shoulder and arm feel heavy and achy. Sometimes she can move it around but sometimes it feels locked and painful when moving it. She did have an injection earlier this week and it is a little better, but still having some pain but not as bad as when she saw the doctor. She does report the shoulder will crack and pop when she moves it. Pain occurs with all movements overhead, out to the side, and behind back. Her left side of the neck also bothers her. She reports that her whole left arm occasionally goes numb and the middle finger is always numb. She reports difficulty sleeping because of the left shoulder.   Patient also reports neuropathy in both feet and legs, swelling in her legs, a lot of pain in her knees and hips, achilles tendinoplasty on the left, and L4-5 infusion with chronic lower back pain that comes and goes.   PERTINENT HISTORY: See PMH above  PAIN:  Are you having pain? Yes:  NPRS scale: 6/10 currently, 8/10 at worst Pain location: Left shoulder Pain description: Heavy, achy Aggravating factors: Moving the left shoulder Relieving factors: Medication, topical cream and patches, ice  PRECAUTIONS: None  PATIENT GOALS: Pain relief and improve use of left arm   OBJECTIVE:  Note: Objective measures were completed at Evaluation unless otherwise noted. PATIENT SURVEYS:  Quick Dash 70.5%    SENSATION: Patient reports sensation deficit of left middle finger, otherwise all WFL but she does not occasional numbness of the left arm  POSTURE: Rounded shoulder posture  CERVICAL ROM:   Active ROM A/PROM (deg) eval  Flexion 35  Extension 20  Right lateral flexion   Left lateral flexion   Right rotation 45  Left rotation 65   (Blank rows = not tested)   UPPER EXTREMITY ROM:   Active ROM Right eval  Left eval  Shoulder flexion 140 120  Shoulder extension    Shoulder abduction 160 120  Shoulder adduction    Shoulder internal rotation T8 L1  Shoulder external rotation 60 / T4 50 / C7  Elbow flexion    Elbow extension    Wrist flexion    Wrist extension    Wrist ulnar deviation    Wrist radial deviation    Wrist pronation    Wrist supination    (Blank rows = not tested)  UPPER EXTREMITY MMT:  MMT Right eval Left eval  Shoulder flexion 4+ 3+  Shoulder extension    Shoulder abduction 4+ 3+  Shoulder adduction    Shoulder internal rotation  4  Shoulder external rotation 4+ 4-  Middle trapezius    Lower trapezius    Elbow flexion 5 4  Elbow extension 5 4  Wrist flexion    Wrist extension    Wrist ulnar deviation    Wrist radial  deviation    Wrist pronation    Wrist supination    Grip strength (lbs) 49 25  (Blank rows = not tested)  SHOULDER SPECIAL TESTS: Not tested due to symptom irritability  JOINT MOBILITY TESTING:  Hypomobility of the left GHJ and with cervical CPAs  PALPATION:  Tenderness noted left upper trap, middle tap, rhomboid, infraspinatus, greater tubercle, bicep tendon                                                                                                                             TREATMENT  [facility] Adult PT Treatment:                                                DATE: 10/22/2023 STM left upper trap and cervical paraspinals UBE L1 x 3 min (fwd/bwd) to improve activity tolerance Supine suboccipital release with gentle manual cervical traction Supine dowel press to overhead reach 10 x 5 sec Supine horizontal abduction with yellow 2 x 10 Seated double ER and scap retraction with yellow 2 x 10 Standing wall slide shoulder elevation 5 x 5 sec  Trigger Point Dry Needling  Subsequent Treatment: Instructions provided previously at initial dry needling treatment.  Instructions reviewed, if requested by the patient, prior to subsequent dry  needling treatment.   Patient Verbal Consent Given: Yes Education Handout Provided: Previously Provided Muscles Treated: left upper trap Electrical Stimulation Performed: No Treatment Response/Outcome: Twitch response   PATIENT EDUCATION: Education details: HEP, TPDN Person educated: Patient Education method: Explanation, Demonstration, Tactile cues, Verbal cues, and Handouts Education comprehension: verbalized understanding, returned demonstration, verbal cues required, tactile cues required, and needs further education  HOME EXERCISE PROGRAM: Access Code: [email or web address]   ASSESSMENT: CLINICAL IMPRESSION: Patient tolerated therapy well with no adverse effects. Continued with TPDN for left upper trap with twitch response. Therapy focused on improving neck and shoulder muscle tension, progressing left shoulder motion and strength. She did report a slight improvement following last TPDN session. She does continue to report left shoulder pain with overhead movement but seemed better in a supine position. She does require cueing for posture while perform exercises. Updated HEP to progress shoulder motion at home. Patient would benefit from continued skilled PT to progress mobility and strength in order to reduce pain and maximize functional ability.   Eval: Patient is a 68 y.o. female who was seen today for physical therapy evaluation and treatment for chronic left shoulder and neck pain with left arm and hand numbness. She did exhibit high irritability level of left shoulder symptoms with limitations in shoulder A/PROM and strength due to pain that are impacting her functional ability. Her left shoulder symptoms seem multifactorial but most consistent with rotator cuff tendinopathy and likely left cervical radiculopathy. She also exhibits tenderness of left cervical and shoulder region and performed TPDN  this visit for left upper trap with twitch response. She was previously provided HEP for left  shoulder but also provided her with neck stretching and postural exercise this visit.  OBJECTIVE IMPAIRMENTS: decreased activity tolerance, decreased ROM, decreased strength, postural dysfunction, and pain.   ACTIVITY LIMITATIONS: carrying, lifting, sleeping, bathing, dressing, reach over head, and hygiene/grooming  PARTICIPATION LIMITATIONS: meal prep, cleaning, laundry, shopping, and community activity  PERSONAL FACTORS: Fitness, Past/current experiences, and Time since onset of injury/illness/exacerbation are also affecting patient's functional outcome.    GOALS: Goals reviewed with patient? Yes  SHORT TERM GOALS: Target date: 11/15/2023  Patient will be I with initial HEP in order to progress with therapy. Baseline: HEP provided at eval Goal status: INITIAL  2.  Patient will report left shoulder pain <= 6/10 in when using left arm order to reduce functional limitations Baseline: 8/10 Goal status: INITIAL  3.  Patient will demonstrate left cervical rotation >/= 60 deg in order to reduce neck tension and pain Baseline: 45 deg Goal status: INITIAL  LONG TERM GOALS: Target date: 12/13/2023  Patient will be I with final HEP to maintain progress from PT. Baseline: HEP provided at eval Goal status: INITIAL  2.  Patient will report QuickDASH </= 45% in order to indicate an improvement in their functional status Baseline: 70.5% Goal status: INITIAL  3.  Patient will demonstrate improved range of motion of shoulder elevation >/= 140 deg in order to improve ability to perform dressing and grooming tasks Baseline: 120 deg Goal status: INITIAL  4.  Patient will demonstrate left shoulder strength >/= 4/5 MMT in order to improve activity tolerance and use of left arm with household tasks Baseline: see limitations above Goal status: INITIAL   PLAN: PT FREQUENCY: 1-2x/week  PT DURATION: 8 weeks  PLANNED INTERVENTIONS: 97164- PT Re-evaluation, 97110-Therapeutic exercises, 97530-  Therapeutic activity, 97112- Neuromuscular re-education, 97535- Self Care, 21308- Manual therapy, 97116- Gait training, Patient/Family education, Taping, Dry Needling, Joint mobilization, Joint manipulation, Spinal manipulation, Spinal mobilization, Cryotherapy, and Moist heat  PLAN FOR NEXT SESSION: Review HEP and progress PRN, manual/TPDN for left neck and shoulder region, left shoulder mobs and PROM, progress left shoulder AAROM->AROM and strengthening as tolerated, postural control; further assessment of cervical and neurodynamic testing   Rosana Hoes, PT, DPT, LAT, ATC 10/22/23  12:39 PM Phone: [phone] Fax: [phone]

## 2023-10-22 ENCOUNTER — Encounter: Payer: Self-pay | Admitting: Physical Therapy

## 2023-10-22 ENCOUNTER — Encounter: Payer: Self-pay | Admitting: Internal Medicine

## 2023-10-22 ENCOUNTER — Ambulatory Visit: Admitting: Physical Therapy

## 2023-10-22 ENCOUNTER — Other Ambulatory Visit: Payer: Self-pay

## 2023-10-22 DIAGNOSIS — M25512 Pain in left shoulder: Secondary | ICD-10-CM

## 2023-10-22 DIAGNOSIS — G8929 Other chronic pain: Secondary | ICD-10-CM | POA: Diagnosis not present

## 2023-10-22 DIAGNOSIS — M6281 Muscle weakness (generalized): Secondary | ICD-10-CM

## 2023-10-22 NOTE — Patient Instructions (Signed)
 Access Code: [email or web address] URL: [email or web address] Date: 10/22/2023 Prepared by: Rosana Hoes  Exercises - Gentle Upper Trap Stretch  - 2-3 x daily - 3 reps - 15 secnds hold - Seated Scapular Retraction  - 2-3 x daily - 10 reps - 3 seconds hold - Supine Shoulder Flexion Extension AAROM with Dowel  - 2 x daily - 2 sets - 10 reps

## 2023-10-24 ENCOUNTER — Encounter: Payer: Self-pay | Admitting: Internal Medicine

## 2023-10-24 MED ORDER — GABAPENTIN 100 MG PO CAPS: 100.0000 mg | ORAL_CAPSULE | Freq: Three times a day (TID) | ORAL | 5 refills | Status: DC

## 2023-10-24 NOTE — Therapy (Signed)
 OUTPATIENT PHYSICAL THERAPY TREATMENT   Patient Name: Frances Nelson MRN: [id number] DOB:May 08, 1956, 68 y.o., female Today's Date: 10/24/2023   END OF SESSION:     Past Medical History:  Diagnosis Date   Arthritis    Asthma    COPD (chronic obstructive pulmonary disease) (HCC)    GERD (gastroesophageal reflux disease)    Hypertension    Pneumonia    Past Surgical History:  Procedure Laterality Date   ABDOMINAL HYSTERECTOMY     APPENDECTOMY     HERNIA REPAIR     Umbilical x 3   KNEE ARTHROSCOPY W/ MENISCAL REPAIR Left 12/2022   TONSILLECTOMY     TRANSFORAMINAL LUMBAR INTERBODY FUSION (TLIF) WITH PEDICLE SCREW FIXATION 1 LEVEL N/A 05/18/2022   Procedure: Lumbar Four-Five Open Laminectomy/Transforaminal Lumbar Interbody Fusion/Posterolateral fusion;  Surgeon: Jadene Pierini, MD;  Location: [facility] OR;  Service: Neurosurgery;  Laterality: N/A;   Patient Active Problem List   Diagnosis Date Noted   Obesity 10/13/2023   Shingles outbreak 05/29/2023   Microhematuria 04/09/2023   Bilateral shoulder pain 04/09/2023   Urethritis 10/05/2022   External otitis of left ear 10/05/2022   Spondylolisthesis of lumbar region 05/18/2022   Smoker 04/08/2022   Deviated nasal septum 04/08/2022   Bilateral pain of leg and foot 04/08/2022   Follicular acne 04/06/2022   Baker cyst, left 04/06/2022   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   Depression 10/04/2021   Left knee pain 10/04/2021   Chronic pain 04/09/2021   Pelvic pain 04/09/2021   HLD (hyperlipidemia) 04/09/2021   Vitamin D deficiency 04/09/2021   Estrogen deficiency 04/09/2021   Hyperglycemia 04/01/2021   Chronic cough 12/30/2020   GERD (gastroesophageal reflux disease) 11/06/2019   Bilateral leg edema 10/16/2019   Pleuritic chest pain 10/16/2019   Hypertension 10/02/2019   Arthralgia 06/28/2018   Sinusitis 06/28/2018   Renal cyst 05/03/2018   Syncope 05/03/2018   Stress and  adjustment reaction 05/03/2018   History of tobacco use 05/02/2018   Primary osteoarthritis of both hands 04/10/2018   Primary osteoarthritis of both knees 04/10/2018   Primary osteoarthritis of both feet 04/10/2018   Achilles tendinitis 04/02/2018   Screening for breast cancer 03/15/2018   Positive ANA (antinuclear antibody) 03/15/2018   Screening for colon cancer 03/15/2018   Rash 03/15/2018   Achilles tendon pain 11/13/2017   Cervical myelopathy with cervical radiculopathy (HCC) 11/13/2017   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   Allergic rhinitis 06/19/2017   Edema 12/24/2015   Carpal tunnel syndrome, right 12/16/2015   Hypercalcemia 11/11/2015   Pain in both upper extremities 11/11/2015   Drug reaction 10/28/2015   Muscle spasms of both lower extremities 10/28/2015   Nerve pain 10/28/2015   Chronic asthmatic bronchitis with acute exacerbation (HCC) 09/06/2015   Environmental and seasonal allergies 07/17/2014    PCP: Corwin Levins, MD  REFERRING PROVIDER: Richardean Sale, DO  REFERRING DIAG: Chronic left shoulder pain; Chronic pain of left knee; Chronic bilateral low back pain with left-sided sciatica  THERAPY DIAG:  No diagnosis found.  Rationale for Evaluation and Treatment: Rehabilitation  ONSET DATE: Chronic for a "couple of months"   SUBJECTIVE SUBJECTIVE STATEMENT: Patient reports they changed her statin medication and she is achy all over. She was a little sore after last visit but it did feel a little better. She is still having numbness into the left hand.    Eval: Patient reports one day she woke up and her left shoulder was hurting  and swollen. The pain has pretty much stayed the same since it started. The left shoulder and arm feel heavy and achy. Sometimes she can move it around but sometimes it feels locked and painful when moving it. She did have an injection earlier this week and it is a little better, but still having some pain but not as bad as when  she saw the doctor. She does report the shoulder will crack and pop when she moves it. Pain occurs with all movements overhead, out to the side, and behind back. Her left side of the neck also bothers her. She reports that her whole left arm occasionally goes numb and the middle finger is always numb. She reports difficulty sleeping because of the left shoulder.   Patient also reports neuropathy in both feet and legs, swelling in her legs, a lot of pain in her knees and hips, achilles tendinoplasty on the left, and L4-5 infusion with chronic lower back pain that comes and goes.   PERTINENT HISTORY: See PMH above  PAIN:  Are you having pain? Yes:  NPRS scale: 6/10 currently, 8/10 at worst Pain location: Left shoulder Pain description: Heavy, achy Aggravating factors: Moving the left shoulder Relieving factors: Medication, topical cream and patches, ice  PRECAUTIONS: None  PATIENT GOALS: Pain relief and improve use of left arm   OBJECTIVE:  Note: Objective measures were completed at Evaluation unless otherwise noted. PATIENT SURVEYS:  Quick Dash 70.5%    SENSATION: Patient reports sensation deficit of left middle finger, otherwise all WFL but she does not occasional numbness of the left arm  POSTURE: Rounded shoulder posture  CERVICAL ROM:   Active ROM A/PROM (deg) eval  Flexion 35  Extension 20  Right lateral flexion   Left lateral flexion   Right rotation 45  Left rotation 65   (Blank rows = not tested)  UPPER EXTREMITY ROM:   Active ROM Right eval Left eval  Shoulder flexion 140 120  Shoulder extension    Shoulder abduction 160 120  Shoulder adduction    Shoulder internal rotation T8 L1  Shoulder external rotation 60 / T4 50 / C7  Elbow flexion    Elbow extension    Wrist flexion    Wrist extension    Wrist ulnar deviation    Wrist radial deviation    Wrist pronation    Wrist supination    (Blank rows = not tested)  UPPER EXTREMITY MMT:  MMT  Right eval Left eval  Shoulder flexion 4+ 3+  Shoulder extension    Shoulder abduction 4+ 3+  Shoulder adduction    Shoulder internal rotation  4  Shoulder external rotation 4+ 4-  Middle trapezius    Lower trapezius    Elbow flexion 5 4  Elbow extension 5 4  Wrist flexion    Wrist extension    Wrist ulnar deviation    Wrist radial deviation    Wrist pronation    Wrist supination    Grip strength (lbs) 49 25  (Blank rows = not tested)  SHOULDER SPECIAL TESTS: Not tested due to symptom irritability  JOINT MOBILITY TESTING:  Hypomobility of the left GHJ and with cervical CPAs  PALPATION:  Tenderness noted left upper trap, middle tap, rhomboid, infraspinatus, greater tubercle, bicep tendon  TREATMENT  [facility] Adult PT Treatment:                                                DATE: 10/25/2023 STM left upper trap and cervical paraspinals UBE L1 x 3 min (fwd/bwd) to improve activity tolerance Supine suboccipital release with gentle manual cervical traction Supine dowel press to overhead reach 10 x 5 sec Supine horizontal abduction with yellow 2 x 10 Seated double ER and scap retraction with yellow 2 x 10 Standing wall slide shoulder elevation 5 x 5 sec  Trigger Point Dry Needling  Subsequent Treatment: Instructions provided previously at initial dry needling treatment.  Instructions reviewed, if requested by the patient, prior to subsequent dry needling treatment.   Patient Verbal Consent Given: Yes Education Handout Provided: Previously Provided Muscles Treated: left upper trap Electrical Stimulation Performed: No Treatment Response/Outcome: Twitch response   PATIENT EDUCATION: Education details: HEP, TPDN Person educated: Patient Education method: Explanation, Demonstration, Tactile cues, Verbal cues, and Handouts Education comprehension:  verbalized understanding, returned demonstration, verbal cues required, tactile cues required, and needs further education  HOME EXERCISE PROGRAM: Access Code: [email or web address]   ASSESSMENT: CLINICAL IMPRESSION: Patient tolerated therapy well with no adverse effects. *** Patient would benefit from continued skilled PT to progress mobility and strength in order to reduce pain and maximize functional ability.  Continued with TPDN for left upper trap with twitch response. Therapy focused on improving neck and shoulder muscle tension, progressing left shoulder motion and strength. She did report a slight improvement following last TPDN session. She does continue to report left shoulder pain with overhead movement but seemed better in a supine position. She does require cueing for posture while perform exercises. Updated HEP to progress shoulder motion at home.    Eval: Patient is a 68 y.o. female who was seen today for physical therapy evaluation and treatment for chronic left shoulder and neck pain with left arm and hand numbness. She did exhibit high irritability level of left shoulder symptoms with limitations in shoulder A/PROM and strength due to pain that are impacting her functional ability. Her left shoulder symptoms seem multifactorial but most consistent with rotator cuff tendinopathy and likely left cervical radiculopathy. She also exhibits tenderness of left cervical and shoulder region and performed TPDN this visit for left upper trap with twitch response. She was previously provided HEP for left shoulder but also provided her with neck stretching and postural exercise this visit.  OBJECTIVE IMPAIRMENTS: decreased activity tolerance, decreased ROM, decreased strength, postural dysfunction, and pain.   ACTIVITY LIMITATIONS: carrying, lifting, sleeping, bathing, dressing, reach over head, and hygiene/grooming  PARTICIPATION LIMITATIONS: meal prep, cleaning, laundry, shopping, and community  activity  PERSONAL FACTORS: Fitness, Past/current experiences, and Time since onset of injury/illness/exacerbation are also affecting patient's functional outcome.    GOALS: Goals reviewed with patient? Yes  SHORT TERM GOALS: Target date: 11/15/2023  Patient will be I with initial HEP in order to progress with therapy. Baseline: HEP provided at eval Goal status: INITIAL  2.  Patient will report left shoulder pain <= 6/10 in when using left arm order to reduce functional limitations Baseline: 8/10 Goal status: INITIAL  3.  Patient will demonstrate left cervical rotation >/= 60 deg in order to reduce neck tension and pain Baseline: 45 deg Goal status: INITIAL  LONG TERM GOALS: Target date: 12/13/2023  Patient  will be I with final HEP to maintain progress from PT. Baseline: HEP provided at eval Goal status: INITIAL  2.  Patient will report QuickDASH </= 45% in order to indicate an improvement in their functional status Baseline: 70.5% Goal status: INITIAL  3.  Patient will demonstrate improved range of motion of shoulder elevation >/= 140 deg in order to improve ability to perform dressing and grooming tasks Baseline: 120 deg Goal status: INITIAL  4.  Patient will demonstrate left shoulder strength >/= 4/5 MMT in order to improve activity tolerance and use of left arm with household tasks Baseline: see limitations above Goal status: INITIAL   PLAN: PT FREQUENCY: 1-2x/week  PT DURATION: 8 weeks  PLANNED INTERVENTIONS: 97164- PT Re-evaluation, 97110-Therapeutic exercises, 97530- Therapeutic activity, 97112- Neuromuscular re-education, 97535- Self Care, 09811- Manual therapy, 97116- Gait training, Patient/Family education, Taping, Dry Needling, Joint mobilization, Joint manipulation, Spinal manipulation, Spinal mobilization, Cryotherapy, and Moist heat  PLAN FOR NEXT SESSION: Review HEP and progress PRN, manual/TPDN for left neck and shoulder region, left shoulder mobs and  PROM, progress left shoulder AAROM->AROM and strengthening as tolerated, postural control; further assessment of cervical and neurodynamic testing   Rosana Hoes, PT, DPT, LAT, ATC 10/24/23  4:24 PM Phone: [phone] Fax: [phone]

## 2023-10-24 NOTE — Addendum Note (Signed)
 Addended by: Corwin Levins on: 10/24/2023 02:44 PM   Modules accepted: Orders

## 2023-10-25 ENCOUNTER — Other Ambulatory Visit: Payer: Self-pay

## 2023-10-25 ENCOUNTER — Ambulatory Visit: Payer: Medicare Other | Admitting: Neurology

## 2023-10-25 ENCOUNTER — Ambulatory Visit (INDEPENDENT_AMBULATORY_CARE_PROVIDER_SITE_OTHER): Admitting: Physical Therapy

## 2023-10-25 ENCOUNTER — Encounter: Payer: Self-pay | Admitting: Physical Therapy

## 2023-10-25 DIAGNOSIS — M25512 Pain in left shoulder: Secondary | ICD-10-CM

## 2023-10-25 DIAGNOSIS — G8929 Other chronic pain: Secondary | ICD-10-CM

## 2023-10-25 DIAGNOSIS — M6281 Muscle weakness (generalized): Secondary | ICD-10-CM | POA: Diagnosis not present

## 2023-10-29 ENCOUNTER — Telehealth: Payer: Self-pay

## 2023-10-29 ENCOUNTER — Ambulatory Visit

## 2023-10-29 ENCOUNTER — Encounter: Payer: Self-pay | Admitting: Internal Medicine

## 2023-10-29 VITALS — Ht 64.0 in | Wt 220.0 lb

## 2023-10-29 DIAGNOSIS — Z1231 Encounter for screening mammogram for malignant neoplasm of breast: Secondary | ICD-10-CM

## 2023-10-29 DIAGNOSIS — Z0000 Encounter for general adult medical examination without abnormal findings: Secondary | ICD-10-CM | POA: Diagnosis not present

## 2023-10-29 DIAGNOSIS — Z780 Asymptomatic menopausal state: Secondary | ICD-10-CM

## 2023-10-29 NOTE — Telephone Encounter (Signed)
 Patient stated that she has been having muscle aches, cramps, dizziness and headaches, since starting Rosuvastatin 10 mg Daily on 10/18/23.  Patient is wanting to know if she can stop taking it.  I informed patient that I would send a message to Dr. Autry Legions to get his thoughts on patient stopping medication.  Patient has an office visit in Sept 2025.

## 2023-10-29 NOTE — Patient Instructions (Addendum)
 [name] , Thank you for taking time to come for your Medicare Wellness Visit. I appreciate your ongoing commitment to your health goals. Please review the following plan we discussed and let me know if I can assist you in the future.   Referrals/Orders/Follow-Ups/Clinician Recommendations: It was nice talking to you today.  Aim for 30 minutes of exercise or brisk walking, 6-8 glasses of water, and 5 servings of fruits and vegetables each day. You have an order for:  [x]   3D Mammogram  [x]   Bone Density     Please call for appointment:  [facility] [address] [place], [place] [address] [phone]  Make sure to wear two-piece clothing.  No lotions, powders, or deodorants the day of the appointment. Make sure to bring picture ID and insurance card.  Bring list of medications you are currently taking including any supplements.    This is a list of the screening recommended for you and due dates:  Health Maintenance  Topic Date Due   Zoster (Shingles) Vaccine (1 of 2) Never done   Screening for Lung Cancer  Never done   COVID-19 Vaccine (3 - Pfizer risk series) 12/29/2019   DEXA scan (bone density measurement)  Never done   Flu Shot  02/15/2024   Mammogram  08/17/2024   Medicare Annual Wellness Visit  10/28/2024   Cologuard (Stool DNA test)  11/14/2024   DTaP/Tdap/Td vaccine (2 - Td or Tdap) 03/15/2028   Pneumonia Vaccine  Completed   Hepatitis C Screening  Completed   HPV Vaccine  Aged Out   Meningitis B Vaccine  Aged Out    Advanced directives: (Declined) Advance directive discussed with you today. Even though you declined this today, please call our office should you change your mind, and we can give you the proper paperwork for you to fill out.  Next Medicare Annual Wellness Visit scheduled for next year: Yes  Managing Pain Without Opioids Opioids are strong medicines used to treat moderate to severe pain. For some people, especially those  who have long-term (chronic) pain, opioids may not be the best choice for pain management due to: Side effects like nausea, constipation, and sleepiness. The risk of addiction (opioid use disorder). The longer you take opioids, the greater your risk of addiction. Pain that lasts for more than 3 months is called chronic pain. Managing chronic pain usually requires more than one approach and is often provided by a team of health care providers working together (multidisciplinary approach). Pain management may be done at a pain management center or pain clinic. How to manage pain without the use of opioids Use non-opioid medicines Non-opioid medicines for pain may include: Over-the-counter or prescription non-steroidal anti-inflammatory drugs (NSAIDs). These may be the first medicines used for pain. They work well for muscle and bone pain, and they reduce swelling. Acetaminophen. This over-the-counter medicine may work well for milder pain but not swelling. Antidepressants. These may be used to treat chronic pain. A certain type of antidepressant (tricyclics) is often used. These medicines are given in lower doses for pain than when used for depression. Anticonvulsants. These are usually used to treat seizures but may also reduce nerve (neuropathic) pain. Muscle relaxants. These relieve pain caused by sudden muscle tightening (spasms). You may also use a pain medicine that is applied to the skin as a patch, cream, or gel (topical analgesic), such as a numbing medicine. These may cause fewer side effects than medicines taken by mouth. Do certain  therapies as directed Some therapies can help with pain management. They include: Physical therapy. You will do exercises to gain strength and flexibility. A physical therapist may teach you exercises to move and stretch parts of your body that are weak, stiff, or painful. You can learn these exercises at physical therapy visits and practice them at home. Physical  therapy may also involve: Massage. Heat wraps or applying heat or cold to affected areas. Electrical signals that interrupt pain signals (transcutaneous electrical nerve stimulation, TENS). Weak lasers that reduce pain and swelling (low-level laser therapy). Signals from your body that help you learn to regulate pain (biofeedback). Occupational therapy. This helps you to learn ways to function at home and work with less pain. Recreational therapy. This involves trying new activities or hobbies, such as a physical activity or drawing. Mental health therapy, including: Cognitive behavioral therapy (CBT). This helps you learn coping skills for dealing with pain. Acceptance and commitment therapy (ACT) to change the way you think and react to pain. Relaxation therapies, including muscle relaxation exercises and mindfulness-based stress reduction. Pain management counseling. This may be individual, family, or group counseling.  Receive medical treatments Medical treatments for pain management include: Nerve block injections. These may include a pain blocker and anti-inflammatory medicines. You may have injections: Near the spine to relieve chronic back or neck pain. Into joints to relieve back or joint pain. Into nerve areas that supply a painful area to relieve body pain. Into muscles (trigger point injections) to relieve some painful muscle conditions. A medical device placed near your spine to help block pain signals and relieve nerve pain or chronic back pain (spinal cord stimulation device). Acupuncture. Follow these instructions at home Medicines Take over-the-counter and prescription medicines only as told by your health care provider. If you are taking pain medicine, ask your health care providers about possible side effects to watch out for. Do not drive or use heavy machinery while taking prescription opioid pain medicine. Lifestyle  Do not use drugs or alcohol to reduce pain. If  you drink alcohol, limit how much you have to: 0-1 drink a day for women who are not pregnant. 0-2 drinks a day for men. Know how much alcohol is in a drink. In the [place] one drink equals one 12 oz bottle of beer (355 mL), one 5 oz glass of wine (148 mL), or one 1 oz glass of hard liquor (44 mL). Do not use any products that contain nicotine or tobacco. These products include cigarettes, chewing tobacco, and vaping devices, such as e-cigarettes. If you need help quitting, ask your health care provider. Eat a healthy diet and maintain a healthy weight. Poor diet and excess weight may make pain worse. Eat foods that are high in fiber. These include fresh fruits and vegetables, whole grains, and beans. Limit foods that are high in fat and processed sugars, such as fried and sweet foods. Exercise regularly. Exercise lowers stress and may help relieve pain. Ask your health care provider what activities and exercises are safe for you. If your health care provider approves, join an exercise class that combines movement and stress reduction. Examples include yoga and tai chi. Get enough sleep. Lack of sleep may make pain worse. Lower stress as much as possible. Practice stress reduction techniques as told by your therapist. General instructions Work with all your pain management providers to find the treatments that work best for you. You are an important member of your pain management team. There are many  things you can do to reduce pain on your own. Consider joining an online or in-person support group for people who have chronic pain. Keep all follow-up visits. This is important. Where to find more information You can find more information about managing pain without opioids from: American Academy of Pain Medicine: painmed.org Institute for Chronic Pain: instituteforchronicpain.org American Chronic Pain Association: theacpa.org Contact a health care provider if: You have side effects from pain  medicine. Your pain gets worse or does not get better with treatments or home therapy. You are struggling with anxiety or depression. Summary Many types of pain can be managed without opioids. Chronic pain may respond better to pain management without opioids. Pain is best managed when you and a team of health care providers work together. Pain management without opioids may include non-opioid medicines, medical treatments, physical therapy, mental health therapy, and lifestyle changes. Tell your health care providers if your pain gets worse or is not being managed well enough. This information is not intended to replace advice given to you by your health care provider. Make sure you discuss any questions you have with your health care provider. Document Revised: 10/13/2020 Document Reviewed: 10/13/2020 Elsevier Patient Education  2024 [employer].

## 2023-10-29 NOTE — Telephone Encounter (Signed)
 Ok to stop, but please restart in 2 wks if he symptoms are not resolved, and may be due to something else.   thanks

## 2023-10-29 NOTE — Progress Notes (Signed)
 Subjective:   Frances Nelson is a 67 y.o. who presents for a Medicare Wellness preventive visit.  Visit Complete: Virtual I connected with  Wells Gerdeman Batta on 10/29/2023 by a audio enabled telemedicine application and verified that I am speaking with the correct person using two identifiers.  Patient Location: Home  Provider Location: Home Office  I discussed the limitations of evaluation and management by telemedicine. The patient expressed understanding and agreed to proceed.  Vital Signs: Because this visit was a virtual/telehealth visit, some criteria may be missing or patient reported. Any vitals not documented were not able to be obtained and vitals that have been documented are patient reported.  VideoDeclined- This patient declined Librarian, academic. Therefore the visit was completed with audio only.  Persons Participating in Visit: Patient.  AWV Questionnaire: No: Patient Medicare AWV questionnaire was not completed prior to this visit.  Cardiac Risk Factors include: advanced age (>52men, >75 women);hypertension;Other (see comment);dyslipidemia, Risk factor comments: COPD     Objective:    Today's Vitals   10/29/23 1052 10/29/23 1053  Weight: 220 lb (99.8 kg)   Height: 5\' 4"  (1.626 m)   PainSc:  5    Body mass index is 37.76 kg/m.     10/29/2023   11:08 AM 10/18/2023   11:15 AM 05/28/2023   12:08 PM 04/25/2023   10:34 AM 10/31/2022    9:35 AM 10/18/2022   11:10 AM 08/17/2022   11:13 AM  Advanced Directives  Does Patient Have a Medical Advance Directive? No No No No No No No  Would patient like information on creating a medical advance directive?  No - Patient declined   Yes (MAU/Ambulatory/Procedural Areas - Information given)      Current Medications (verified) Outpatient Encounter Medications as of 10/29/2023  Medication Sig   acetaminophen (TYLENOL) 650 MG CR tablet Take 1,300 mg by mouth as needed.   acidophilus (RISAQUAD) CAPS  capsule Take 1 capsule by mouth in the morning.   Adapalene (DIFFERIN) 0.3 % gel Apply 1 Application topically at bedtime. Apply a pea sized amount to face and behind ears 3 nights weekly   B Complex Vitamins (VITAMIN-B COMPLEX PO) Take by mouth daily.   Calcium-Vitamins C & D (CALCIUM/C/D PO) Take 1 tablet by mouth in the morning.   cetirizine (ZYRTEC) 10 MG tablet Take 10 mg by mouth in the morning.   Cholecalciferol (HM VITAMIN D3 PO) Take 5,000 Units by mouth in the morning.   clindamycin (CLEOCIN T) 1 % SWAB APPLY 1 APPLICATION TOPICALLY IN THE MORNING. WIPE ON FACE AND BEHIND EARS EVERY MORNING   clobetasol cream (TEMOVATE) 0.05 % Apply 1 Application topically 2 (two) times daily. Apply to affected areas 2 x daily for 2 weeks then stop. Use as needed (Patient taking differently: Apply 1 Application topically as needed. Apply to affected areas 2 x daily for 2 weeks then stop. Use as needed)   COLLAGEN PO Take by mouth. Collagen Peptides   doxycycline (VIBRAMYCIN) 50 MG capsule Take 1 capsule (50 mg total) by mouth daily. With food and plenty of fluid   DULoxetine (CYMBALTA) 30 MG capsule TAKE 1 TAB BY MOUTH IN THE AM AND 2 IN THE PM   ezetimibe (ZETIA) 10 MG tablet Take 1 tablet (10 mg total) by mouth daily.   gabapentin (NEURONTIN) 100 MG capsule Take 1 capsule (100 mg total) by mouth 3 (three) times daily.   Homeopathic Products (ZICAM ALLERGY RELIEF NA) Place into  the nose.   hydrochlorothiazide (HYDRODIURIL) 12.5 MG tablet TAKE 1 TABLET BY MOUTH EVERY DAY (Patient taking differently: Take 12.5 mg by mouth as needed.)   ipratropium-albuterol (DUONEB) 0.5-2.5 (3) MG/3ML SOLN Inhale 3 mLs into the lungs in the morning, at noon, in the evening, and at bedtime.   lidocaine (LIDODERM) 5 % Place 1 patch onto the skin daily. Remove & Discard patch within 12 hours or as directed by MD   losartan (COZAAR) 25 MG tablet TAKE 1 TABLET (25 MG TOTAL) BY MOUTH DAILY.   MELATONIN PO Take 5 mg by mouth at  bedtime.   meloxicam (MOBIC) 15 MG tablet Take 0.5 tablets (7.5 mg total) by mouth daily. (Patient taking differently: Take 7.5 mg by mouth as needed.)   methocarbamol (ROBAXIN) 750 MG tablet TAKE 1 TABLET (750 MG TOTAL) BY MOUTH EVERY 6 (SIX) HOURS AS NEEDED FOR MUSCLE SPASMS (NOT COVERED)   mometasone (NASONEX) 50 MCG/ACT nasal spray Place 2 sprays into the nose every evening.   montelukast (SINGULAIR) 10 MG tablet TAKE 1 TABLET BY MOUTH EVERYDAY AT BEDTIME   Multiple Vitamins-Minerals (MULTIVITAMIN WITH MINERALS) tablet Take 1 tablet by mouth in the morning.   omeprazole (PRILOSEC) 40 MG capsule TAKE 1 CAPSULE (40 MG TOTAL) BY MOUTH IN THE MORNING AND AT BEDTIME.   phentermine 30 MG capsule Take 1 capsule (30 mg total) by mouth every morning.   rosuvastatin (CRESTOR) 10 MG tablet Take 1 tablet (10 mg total) by mouth daily.   topiramate (TOPAMAX) 50 MG tablet Take 1 tablet (50 mg total) by mouth 2 (two) times daily.   traMADol (ULTRAM) 50 MG tablet Take 1-2 tablets (50-100 mg total) by mouth every 6 (six) hours as needed.   Facility-Administered Encounter Medications as of 10/29/2023  Medication   triamcinolone acetonide (KENALOG) 10 MG/ML injection 10 mg    Allergies (verified) Symbicort [budesonide-formoterol fumarate], Tramadol, Celebrex [celecoxib], Chocolate flavoring agent (non-screening), Ciprofloxacin, Egg-derived products, Flavoring agent (non-screening), Lisinopril, Lyrica [pregabalin], Penicillins, Wellbutrin [bupropion], and Codeine   History: Past Medical History:  Diagnosis Date   Arthritis    Asthma    COPD (chronic obstructive pulmonary disease) (HCC)    GERD (gastroesophageal reflux disease)    Hypertension    Pneumonia    Past Surgical History:  Procedure Laterality Date   ABDOMINAL HYSTERECTOMY     APPENDECTOMY     HERNIA REPAIR     Umbilical x 3   KNEE ARTHROSCOPY W/ MENISCAL REPAIR Left 12/2022   TONSILLECTOMY     TRANSFORAMINAL LUMBAR INTERBODY FUSION  (TLIF) WITH PEDICLE SCREW FIXATION 1 LEVEL N/A 05/18/2022   Procedure: Lumbar Four-Five Open Laminectomy/Transforaminal Lumbar Interbody Fusion/Posterolateral fusion;  Surgeon: Jadene Pierini, MD;  Location: [facility] OR;  Service: Neurosurgery;  Laterality: N/A;   Family History  Problem Relation Age of Onset   Angina Father    Parkinson's disease Father    Leukemia Sister    Kidney disease Sister    Kidney disease Brother    Brain cancer Brother        glioblastoma    Cancer Brother    Allergies Son    Asthma Son    Allergies Son    Social History   Socioeconomic History   Marital status: Single    Spouse name: Not on file   Number of children: 2   Years of education: Not on file   Highest education level: 12th grade  Occupational History   Occupation: RETIRED  Tobacco Use  Smoking status: Every Day    Current packs/day: 1.00    Average packs/day: 1 pack/day for 49.0 years (49.0 ttl pk-yrs)    Types: Cigarettes    Passive exposure: Never   Smokeless tobacco: Never   Tobacco comments:    1 pack a day  Vaping Use   Vaping status: Never Used  Substance and Sexual Activity   Alcohol use: No   Drug use: Never   Sexual activity: Not on file  Other Topics Concern   Not on file  Social History Narrative   Right Handed    Lives in a two story home - Lives with son       Are you currently employed ?    What is your current occupation?retired   Do you live at home alone?   Who lives with you?    What type of home do you live in: 1 story or 2 story?     Caffeine 1-2 cups a day   Social Drivers of Health   Financial Resource Strain: Medium Risk (10/29/2023)   Overall Financial Resource Strain (CARDIA)    Difficulty of Paying Living Expenses: Somewhat hard  Food Insecurity: No Food Insecurity (10/29/2023)   Hunger Vital Sign    Worried About Running Out of Food in the Last Year: Never true    Ran Out of Food in the Last Year: Never true  Recent Concern: Food  Insecurity - Food Insecurity Present (10/07/2023)   Hunger Vital Sign    Worried About Running Out of Food in the Last Year: Never true    Ran Out of Food in the Last Year: Sometimes true  Transportation Needs: No Transportation Needs (10/29/2023)   PRAPARE - Administrator, Civil Service (Medical): No    Lack of Transportation (Non-Medical): No  Recent Concern: Transportation Needs - Unmet Transportation Needs (10/07/2023)   PRAPARE - Administrator, Civil Service (Medical): Yes    Lack of Transportation (Non-Medical): No  Physical Activity: Inactive (10/29/2023)   Exercise Vital Sign    Days of Exercise per Week: 0 days    Minutes of Exercise per Session: 0 min  Stress: No Stress Concern Present (10/29/2023)   [employer] of Occupational Health - Occupational Stress Questionnaire    Feeling of Stress : Not at all  Recent Concern: Stress - Stress Concern Present (10/07/2023)   [employer] of Occupational Health - Occupational Stress Questionnaire    Feeling of Stress : To some extent  Social Connections: Socially Isolated (10/29/2023)   Social Connection and Isolation Panel [NHANES]    Frequency of Communication with Friends and Family: Once a week    Frequency of Social Gatherings with Friends and Family: Never    Attends Religious Services: Never    Diplomatic Services operational officer: No    Attends Engineer, structural: Never    Marital Status: Divorced    Tobacco Counseling Ready to quit: Not Answered Counseling given: Not Answered Tobacco comments: 1 pack a day    Clinical Intake:  Pre-visit preparation completed: Yes  Pain : 0-10 Pain Score: 5  Pain Type: Chronic pain Pain Location:  (from neck toes-per pt) Pain Descriptors / Indicators: Aching, Discomfort Pain Onset: More than a month ago Pain Frequency: Constant Pain Relieving Factors: Tylenol, Gabapentin  Pain Relieving Factors: Tylenol, Gabapentin  BMI -  recorded: 37.76 Nutritional Status: BMI > 30  Obese Nutritional Risks: Nausea/ vomitting/ diarrhea Diabetes: No  Lab  Results  Component Value Date   HGBA1C 6.1 10/11/2023   HGBA1C 6.1 04/09/2023   HGBA1C 6.0 10/05/2022     How often do you need to have someone help you when you read instructions, pamphlets, or other written materials from your doctor or pharmacy?: 1 - Never  Interpreter Needed?: No  Information entered by :: Quamesha Mullet, RMA   Activities of Daily Living     10/29/2023   10:55 AM  In your present state of health, do you have any difficulty performing the following activities:  Hearing? 0  Vision? 0  Difficulty concentrating or making decisions? 0  Walking or climbing stairs? 0  Dressing or bathing? 0  Doing errands, shopping? 0  Preparing Food and eating ? N  Using the Toilet? N  In the past six months, have you accidently leaked urine? N  Do you have problems with loss of bowel control? N  Managing your Medications? N  Managing your Finances? N  Housekeeping or managing your Housekeeping? N    Patient Care Team: Corwin Levins, MD as PCP - General (Internal Medicine) Pollyann Savoy, MD as Consulting Physician (Rheumatology) Myra Rude, MD as Consulting Physician (Family Medicine) Johney Maine, MD as Consulting Physician (Hematology) Conley Rolls, My [place], [place] as Referring Physician (Optometry) Loleta Chance, Manus Gunning, MD as Consulting Physician (Neurology)  Indicate any recent Medical Services you may have received from other than [facility] providers in the past year (date may be approximate).     Assessment:   This is a routine wellness examination for Frances Nelson.  Hearing/Vision screen Hearing Screening - Comments:: Some loss in lt ear-per pt  Vision Screening - Comments:: Wears eyeglasses   Goals Addressed             This Visit's Progress    My goal is to not have any more surgeries or hospital stays.   On track      Depression Screen      10/29/2023   11:12 AM 10/11/2023   11:02 AM 05/28/2023    1:55 PM 04/09/2023   11:08 AM 10/05/2022   11:20 AM 06/20/2022    3:13 PM 04/06/2022    1:56 PM  PHQ 2/9 Scores  PHQ - 2 Score 1 0 0 0 2 0 5  PHQ- 9 Score 4    9  14     Fall Risk     10/29/2023   11:09 AM 10/11/2023   11:08 AM 05/28/2023    1:55 PM 04/25/2023   10:34 AM 04/09/2023   11:08 AM  Fall Risk   Falls in the past year? 1 1 0 1 0  Number falls in past yr: 1 0 0 1 0  Injury with Fall? 0 0 0 0 0  Risk for fall due to :  History of fall(s) No Fall Risks  No Fall Risks  Follow up Falls evaluation completed;Falls prevention discussed Falls evaluation completed Falls evaluation completed Falls evaluation completed Falls evaluation completed    MEDICARE RISK AT HOME:  Medicare Risk at Home Any stairs in or around the home?: Yes If so, are there any without handrails?: Yes Home free of loose throw rugs in walkways, pet beds, electrical cords, etc?: Yes Adequate lighting in your home to reduce risk of falls?: Yes Life alert?: No Use of a cane, walker or w/c?: Yes (cane) Grab bars in the bathroom?: No Shower chair or bench in shower?: Yes Elevated toilet seat or a handicapped toilet?: Yes  TIMED  UP AND GO:  Was the test performed?  No  Cognitive Function: Declined: Patient declined cognitive screening, but was able to answer questions in an accurate and timely manner. No cognitive impairments observed.        06/20/2022    3:26 PM  6CIT Screen  What Year? 0 points  What month? 0 points  What time? 0 points  Count back from 20 0 points  Months in reverse 0 points  Repeat phrase 0 points  Total Score 0 points    Immunizations Immunization History  Administered Date(s) Administered   PFIZER(Purple Top)SARS-COV-2 Vaccination 11/06/2019, 12/01/2019   PNEUMOCOCCAL CONJUGATE-20 04/09/2023   Tdap 03/15/2018    Screening Tests Health Maintenance  Topic Date Due   Zoster Vaccines- Shingrix (1 of 2) Never  done   Lung Cancer Screening  Never done   COVID-19 Vaccine (3 - Pfizer risk series) 12/29/2019   DEXA SCAN  Never done   INFLUENZA VACCINE  02/15/2024   MAMMOGRAM  08/17/2024   Medicare Annual Wellness (AWV)  10/28/2024   Fecal DNA (Cologuard)  11/14/2024   DTaP/Tdap/Td (2 - Td or Tdap) 03/15/2028   Pneumonia Vaccine 72+ Years old  Completed   Hepatitis C Screening  Completed   HPV VACCINES  Aged Out   Meningococcal B Vaccine  Aged Out    Health Maintenance  Health Maintenance Due  Topic Date Due   Zoster Vaccines- Shingrix (1 of 2) Never done   Lung Cancer Screening  Never done   COVID-19 Vaccine (3 - Pfizer risk series) 12/29/2019   DEXA SCAN  Never done   Health Maintenance Items Addressed: Mammogram ordered, DEXA ordered, Lung Cancer Screening ordered, See Nurse Notes  Additional Screening:  Vision Screening: Recommended annual ophthalmology exams for early detection of glaucoma and other disorders of the eye.  Dental Screening: Recommended annual dental exams for proper oral hygiene  Community Resource Referral / Chronic Care Management: CRR required this visit?  No   CCM required this visit?  No     Plan:     I have personally reviewed and noted the following in the patient's chart:   Medical and social history Use of alcohol, tobacco or illicit drugs  Current medications and supplements including opioid prescriptions. Patient is currently taking opioid prescriptions. Information provided to patient regarding non-opioid alternatives. Patient advised to discuss non-opioid treatment plan with their provider. Functional ability and status Nutritional status Physical activity Advanced directives List of other physicians Hospitalizations, surgeries, and ER visits in previous 12 months Vitals Screenings to include cognitive, depression, and falls Referrals and appointments  In addition, I have reviewed and discussed with patient certain preventive protocols,  quality metrics, and best practice recommendations. A written personalized care plan for preventive services as well as general preventive health recommendations were provided to patient.     Frances Nelson, CMA   11/01/2023   After Visit Summary: (MyChart) Due to this being a telephonic visit, the after visit summary with patients personalized plan was offered to patient via MyChart   Notes: Please refer to Routing Comments.

## 2023-10-30 ENCOUNTER — Ambulatory Visit (INDEPENDENT_AMBULATORY_CARE_PROVIDER_SITE_OTHER): Admitting: Physical Therapy

## 2023-10-30 ENCOUNTER — Other Ambulatory Visit: Payer: Self-pay

## 2023-10-30 ENCOUNTER — Encounter: Payer: Self-pay | Admitting: Physical Therapy

## 2023-10-30 DIAGNOSIS — M25512 Pain in left shoulder: Secondary | ICD-10-CM

## 2023-10-30 DIAGNOSIS — M6281 Muscle weakness (generalized): Secondary | ICD-10-CM | POA: Diagnosis not present

## 2023-10-30 DIAGNOSIS — G8929 Other chronic pain: Secondary | ICD-10-CM

## 2023-10-30 NOTE — Therapy (Signed)
 OUTPATIENT PHYSICAL THERAPY TREATMENT   Patient Name: Frances Nelson MRN: [id number] DOB:December 28, 1955, 68 y.o., female Today's Date: 10/30/2023   END OF SESSION:  PT End of Session - 10/30/23 1109     Visit Number 4    Number of Visits 17    Date for PT Re-Evaluation 12/13/23    Authorization Type MCR    Progress Note Due on Visit 10    PT Start Time 1103    PT Stop Time 1145    PT Time Calculation (min) 42 min    Activity Tolerance Patient tolerated treatment well    Behavior During Therapy WFL for tasks assessed/performed                Past Medical History:  Diagnosis Date   Arthritis    Asthma    COPD (chronic obstructive pulmonary disease) (HCC)    GERD (gastroesophageal reflux disease)    Hypertension    Pneumonia    Past Surgical History:  Procedure Laterality Date   ABDOMINAL HYSTERECTOMY     APPENDECTOMY     HERNIA REPAIR     Umbilical x 3   KNEE ARTHROSCOPY W/ MENISCAL REPAIR Left 12/2022   TONSILLECTOMY     TRANSFORAMINAL LUMBAR INTERBODY FUSION (TLIF) WITH PEDICLE SCREW FIXATION 1 LEVEL N/A 05/18/2022   Procedure: Lumbar Four-Five Open Laminectomy/Transforaminal Lumbar Interbody Fusion/Posterolateral fusion;  Surgeon: Cannon Champion, MD;  Location: [facility] OR;  Service: Neurosurgery;  Laterality: N/A;   Patient Active Problem List   Diagnosis Date Noted   Obesity 10/13/2023   Shingles outbreak 05/29/2023   Microhematuria 04/09/2023   Bilateral shoulder pain 04/09/2023   Urethritis 10/05/2022   External otitis of left ear 10/05/2022   Spondylolisthesis of lumbar region 05/18/2022   Smoker 04/08/2022   Deviated nasal septum 04/08/2022   Bilateral pain of leg and foot 04/08/2022   Follicular acne 04/06/2022   Baker cyst, left 04/06/2022   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   Depression 10/04/2021   Left knee pain 10/04/2021   Chronic pain 04/09/2021   Pelvic pain 04/09/2021   HLD (hyperlipidemia)  04/09/2021   Vitamin D deficiency 04/09/2021   Estrogen deficiency 04/09/2021   Hyperglycemia 04/01/2021   Chronic cough 12/30/2020   GERD (gastroesophageal reflux disease) 11/06/2019   Bilateral leg edema 10/16/2019   Pleuritic chest pain 10/16/2019   Hypertension 10/02/2019   Arthralgia 06/28/2018   Sinusitis 06/28/2018   Renal cyst 05/03/2018   Syncope 05/03/2018   Stress and adjustment reaction 05/03/2018   History of tobacco use 05/02/2018   Primary osteoarthritis of both hands 04/10/2018   Primary osteoarthritis of both knees 04/10/2018   Primary osteoarthritis of both feet 04/10/2018   Achilles tendinitis 04/02/2018   Screening for breast cancer 03/15/2018   Positive ANA (antinuclear antibody) 03/15/2018   Screening for colon cancer 03/15/2018   Rash 03/15/2018   Achilles tendon pain 11/13/2017   Cervical myelopathy with cervical radiculopathy (HCC) 11/13/2017   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   Allergic rhinitis 06/19/2017   Edema 12/24/2015   Carpal tunnel syndrome, right 12/16/2015   Hypercalcemia 11/11/2015   Pain in both upper extremities 11/11/2015   Drug reaction 10/28/2015   Muscle spasms of both lower extremities 10/28/2015   Nerve pain 10/28/2015   Chronic asthmatic bronchitis with acute exacerbation (HCC) 09/06/2015   Environmental and seasonal allergies 07/17/2014    PCP: Roslyn Coombe, MD  REFERRING PROVIDER: Ulysees Gander, DO  REFERRING DIAG: Chronic  left shoulder pain; Chronic pain of left knee; Chronic bilateral low back pain with left-sided sciatica  THERAPY DIAG:  Chronic left shoulder pain  Muscle weakness (generalized)  Rationale for Evaluation and Treatment: Rehabilitation  ONSET DATE: Chronic for a "couple of months"   SUBJECTIVE SUBJECTIVE STATEMENT: Patient reports she messaged her doctor about the statin medication and she was told to stop taking it for two week, today is the first day she has not taken it. She is feeling  about the same as last visit.    Eval: Patient reports one day she woke up and her left shoulder was hurting and swollen. The pain has pretty much stayed the same since it started. The left shoulder and arm feel heavy and achy. Sometimes she can move it around but sometimes it feels locked and painful when moving it. She did have an injection earlier this week and it is a little better, but still having some pain but not as bad as when she saw the doctor. She does report the shoulder will crack and pop when she moves it. Pain occurs with all movements overhead, out to the side, and behind back. Her left side of the neck also bothers her. She reports that her whole left arm occasionally goes numb and the middle finger is always numb. She reports difficulty sleeping because of the left shoulder.   Patient also reports neuropathy in both feet and legs, swelling in her legs, a lot of pain in her knees and hips, achilles tendinoplasty on the left, and L4-5 infusion with chronic lower back pain that comes and goes.   PERTINENT HISTORY: See PMH above  PAIN:  Are you having pain? Yes:  NPRS scale: 6/10 currently, 8/10 at worst Pain location: Left shoulder Pain description: Heavy, achy Aggravating factors: Moving the left shoulder Relieving factors: Medication, topical cream and patches, ice  PRECAUTIONS: None  PATIENT GOALS: Pain relief and improve use of left arm   OBJECTIVE:  Note: Objective measures were completed at Evaluation unless otherwise noted. PATIENT SURVEYS:  Quick Dash 70.5%    SENSATION: Patient reports sensation deficit of left middle finger, otherwise all WFL but she does not occasional numbness of the left arm  POSTURE: Rounded shoulder posture  CERVICAL ROM:   Active ROM A/PROM (deg) eval  Flexion 35  Extension 20  Right lateral flexion   Left lateral flexion   Right rotation 45  Left rotation 65   (Blank rows = not tested)  UPPER EXTREMITY ROM:   Active ROM  Right eval Left eval Left 10/30/2023  Shoulder flexion 140 120 135  Shoulder extension     Shoulder abduction 160 120 140  Shoulder adduction     Shoulder internal rotation T8 L1   Shoulder external rotation 60 / T4 50 / C7   Elbow flexion     Elbow extension     Wrist flexion     Wrist extension     Wrist ulnar deviation     Wrist radial deviation     Wrist pronation     Wrist supination     (Blank rows = not tested)  UPPER EXTREMITY MMT:  MMT Right eval Left eval  Shoulder flexion 4+ 3+  Shoulder extension    Shoulder abduction 4+ 3+  Shoulder adduction    Shoulder internal rotation  4  Shoulder external rotation 4+ 4-  Middle trapezius    Lower trapezius    Elbow flexion 5 4  Elbow extension 5  4  Wrist flexion    Wrist extension    Wrist ulnar deviation    Wrist radial deviation    Wrist pronation    Wrist supination    Grip strength (lbs) 49 25  (Blank rows = not tested)  SHOULDER SPECIAL TESTS: Not tested due to symptom irritability  JOINT MOBILITY TESTING:  Hypomobility of the left GHJ and with cervical CPAs  PALPATION:  Tenderness noted left upper trap, middle tap, rhomboid, infraspinatus, greater tubercle, bicep tendon                                                                                                                             TREATMENT  [facility] Adult PT Treatment:                                                DATE: 10/30/2023 UBE L1 x 4 min (fwd/bwd) to improve endurance and workload capacity Left GHJ mobs primarily inferior and posterior at various ranges of elevation Left AC joint mobs and AC joint MWM with forward elevation Supine shoulder flexion with dowel 2 x 10 Supine horizontal abduction with yellow 2 x 10 Supine shoulder flexion with 1# 2 x 10 Standing wall slide for shoulder elevation 2 x 10   PATIENT EDUCATION: Education details: HEP update Person educated: Patient Education method: Explanation, Demonstration, Tactile  cues, Verbal cues, and Handouts Education comprehension: verbalized understanding, returned demonstration, verbal cues required, tactile cues required, and needs further education  HOME EXERCISE PROGRAM: Access Code: [email or web address]   ASSESSMENT: CLINICAL IMPRESSION: Patient tolerated therapy well with no adverse effects. She continues to report generalized achiness and left shoulder/neck doing about the same. Further assessed painful clicking she feels with shoulder elevation and it seems to be mainly relate to the [facility] joint. Trialed various mobilization and MWM techniques with no benefit for clicking or pain with shoulder elevation. She was able to perform wall slides without onset of clicking but does report tension at end range likely more related muscular tightness. She does exhibit an improvement in her shoulder elevation this visit. Updated her HEP to progress shoulder motion and postural exercise. Patient would benefit from continued skilled PT to progress mobility and strength in order to reduce pain and maximize functional ability.   Eval: Patient is a 68 y.o. female who was seen today for physical therapy evaluation and treatment for chronic left shoulder and neck pain with left arm and hand numbness. She did exhibit high irritability level of left shoulder symptoms with limitations in shoulder A/PROM and strength due to pain that are impacting her functional ability. Her left shoulder symptoms seem multifactorial but most consistent with rotator cuff tendinopathy and likely left cervical radiculopathy. She also exhibits tenderness of left cervical and shoulder region and performed TPDN this visit for left upper trap with  twitch response. She was previously provided HEP for left shoulder but also provided her with neck stretching and postural exercise this visit.  OBJECTIVE IMPAIRMENTS: decreased activity tolerance, decreased ROM, decreased strength, postural dysfunction, and pain.   ACTIVITY  LIMITATIONS: carrying, lifting, sleeping, bathing, dressing, reach over head, and hygiene/grooming  PARTICIPATION LIMITATIONS: meal prep, cleaning, laundry, shopping, and community activity  PERSONAL FACTORS: Fitness, Past/current experiences, and Time since onset of injury/illness/exacerbation are also affecting patient's functional outcome.    GOALS: Goals reviewed with patient? Yes  SHORT TERM GOALS: Target date: 11/15/2023  Patient will be I with initial HEP in order to progress with therapy. Baseline: HEP provided at eval Goal status: INITIAL  2.  Patient will report left shoulder pain <= 6/10 in when using left arm order to reduce functional limitations Baseline: 8/10 Goal status: INITIAL  3.  Patient will demonstrate left cervical rotation >/= 60 deg in order to reduce neck tension and pain Baseline: 45 deg Goal status: INITIAL  LONG TERM GOALS: Target date: 12/13/2023  Patient will be I with final HEP to maintain progress from PT. Baseline: HEP provided at eval Goal status: INITIAL  2.  Patient will report QuickDASH </= 45% in order to indicate an improvement in their functional status Baseline: 70.5% Goal status: INITIAL  3.  Patient will demonstrate improved range of motion of shoulder elevation >/= 140 deg in order to improve ability to perform dressing and grooming tasks Baseline: 120 deg Goal status: INITIAL  4.  Patient will demonstrate left shoulder strength >/= 4/5 MMT in order to improve activity tolerance and use of left arm with household tasks Baseline: see limitations above Goal status: INITIAL   PLAN: PT FREQUENCY: 1-2x/week  PT DURATION: 8 weeks  PLANNED INTERVENTIONS: 97164- PT Re-evaluation, 97110-Therapeutic exercises, 97530- Therapeutic activity, 97112- Neuromuscular re-education, 97535- Self Care, 09811- Manual therapy, 97116- Gait training, Patient/Family education, Taping, Dry Needling, Joint mobilization, Joint manipulation, Spinal  manipulation, Spinal mobilization, Cryotherapy, and Moist heat  PLAN FOR NEXT SESSION: Review HEP and progress PRN, manual/TPDN for left neck and shoulder region, left shoulder mobs and PROM, progress left shoulder AAROM->AROM and strengthening as tolerated, postural control; further assessment of cervical and neurodynamic testing   Leah Primus, PT, DPT, LAT, ATC 10/30/23  12:47 PM Phone: [phone] Fax: [phone]

## 2023-10-30 NOTE — Patient Instructions (Signed)
 Access Code: [email or web address] URL: [email or web address] Date: 10/30/2023 Prepared by: Leah Primus  Exercises - Gentle Upper Trap Stretch  - 2-3 x daily - 3 reps - 15 secnds hold - Supine Shoulder Flexion Extension AAROM with Dowel  - 2 x daily - 2 sets - 10 reps - Standing shoulder flexion wall slides  - 2 x daily - 2 sets - 10 reps - Standing Row with Anchored Resistance  - 1 x daily - 3 sets - 10 reps

## 2023-11-02 ENCOUNTER — Ambulatory Visit
Admission: RE | Admit: 2023-11-02 | Discharge: 2023-11-02 | Disposition: A | Discharge status: Home / Self Care | Source: Ambulatory Visit | Attending: Internal Medicine | Admitting: Internal Medicine

## 2023-11-02 DIAGNOSIS — Z780 Asymptomatic menopausal state: Secondary | ICD-10-CM

## 2023-11-02 DIAGNOSIS — Z1231 Encounter for screening mammogram for malignant neoplasm of breast: Secondary | ICD-10-CM

## 2023-11-04 ENCOUNTER — Other Ambulatory Visit: Payer: Self-pay | Admitting: Internal Medicine

## 2023-11-04 ENCOUNTER — Encounter: Payer: Self-pay | Admitting: Internal Medicine

## 2023-11-04 MED ORDER — ALENDRONATE SODIUM 70 MG PO TABS: 70.0000 mg | ORAL_TABLET | ORAL | 3 refills | Status: AC

## 2023-11-05 ENCOUNTER — Ambulatory Visit: Admitting: Sports Medicine

## 2023-11-07 ENCOUNTER — Other Ambulatory Visit: Payer: Self-pay

## 2023-11-07 ENCOUNTER — Ambulatory Visit (INDEPENDENT_AMBULATORY_CARE_PROVIDER_SITE_OTHER): Admitting: Physical Therapy

## 2023-11-07 ENCOUNTER — Encounter: Payer: Self-pay | Admitting: Physical Therapy

## 2023-11-07 DIAGNOSIS — M25512 Pain in left shoulder: Secondary | ICD-10-CM | POA: Diagnosis not present

## 2023-11-07 DIAGNOSIS — M6281 Muscle weakness (generalized): Secondary | ICD-10-CM

## 2023-11-07 DIAGNOSIS — G8929 Other chronic pain: Secondary | ICD-10-CM

## 2023-11-07 NOTE — Therapy (Signed)
 OUTPATIENT PHYSICAL THERAPY TREATMENT   Patient Name: Frances Nelson MRN: [id number] DOB:[birth year], 68 y.o., female Today's Date: 11/07/2023   END OF SESSION:  PT End of Session - 11/07/23 1132     Visit Number 5    Number of Visits 17    Date for PT Re-Evaluation 12/13/23    Authorization Type MCR    Progress Note Due on Visit 10    PT Start Time 1100    PT Stop Time 1149    PT Time Calculation (min) 49 min    Activity Tolerance Patient tolerated treatment well    Behavior During Therapy WFL for tasks assessed/performed                 Past Medical History:  Diagnosis Date   Arthritis    Asthma    COPD (chronic obstructive pulmonary disease) (HCC)    GERD (gastroesophageal reflux disease)    Hypertension    Pneumonia    Past Surgical History:  Procedure Laterality Date   ABDOMINAL HYSTERECTOMY     APPENDECTOMY     HERNIA REPAIR     Umbilical x 3   KNEE ARTHROSCOPY W/ MENISCAL REPAIR Left 12/2022   TONSILLECTOMY     TRANSFORAMINAL LUMBAR INTERBODY FUSION (TLIF) WITH PEDICLE SCREW FIXATION 1 LEVEL N/A 05/18/2022   Procedure: Lumbar Four-Five Open Laminectomy/Transforaminal Lumbar Interbody Fusion/Posterolateral fusion;  Surgeon: Cannon Champion, MD;  Location: [facility] OR;  Service: Neurosurgery;  Laterality: N/A;   Patient Active Problem List   Diagnosis Date Noted   Obesity 10/13/2023   Shingles outbreak 05/29/2023   Microhematuria 04/09/2023   Bilateral shoulder pain 04/09/2023   Urethritis 10/05/2022   External otitis of left ear 10/05/2022   Spondylolisthesis of lumbar region 05/18/2022   Smoker 04/08/2022   Deviated nasal septum 04/08/2022   Bilateral pain of leg and foot 04/08/2022   Follicular acne 04/06/2022   Baker cyst, left 04/06/2022   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   Depression 10/04/2021   Left knee pain 10/04/2021   Chronic pain 04/09/2021   Pelvic pain 04/09/2021   HLD (hyperlipidemia)  04/09/2021   Vitamin D  deficiency 04/09/2021   Estrogen deficiency 04/09/2021   Hyperglycemia 04/01/2021   Chronic cough 12/30/2020   GERD (gastroesophageal reflux disease) 11/06/2019   Bilateral leg edema 10/16/2019   Pleuritic chest pain 10/16/2019   Hypertension 10/02/2019   Arthralgia 06/28/2018   Sinusitis 06/28/2018   Renal cyst 05/03/2018   Syncope 05/03/2018   Stress and adjustment reaction 05/03/2018   History of tobacco use 05/02/2018   Primary osteoarthritis of both hands 04/10/2018   Primary osteoarthritis of both knees 04/10/2018   Primary osteoarthritis of both feet 04/10/2018   Achilles tendinitis 04/02/2018   Screening for breast cancer 03/15/2018   Positive ANA (antinuclear antibody) 03/15/2018   Screening for colon cancer 03/15/2018   Rash 03/15/2018   Achilles tendon pain 11/13/2017   Cervical myelopathy with cervical radiculopathy (HCC) 11/13/2017   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   Allergic rhinitis 06/19/2017   Edema 12/24/2015   Carpal tunnel syndrome, right 12/16/2015   Hypercalcemia 11/11/2015   Pain in both upper extremities 11/11/2015   Drug reaction 10/28/2015   Muscle spasms of both lower extremities 10/28/2015   Nerve pain 10/28/2015   Chronic asthmatic bronchitis with acute exacerbation (HCC) 09/06/2015   Environmental and seasonal allergies 07/17/2014    PCP: Roslyn Coombe, MD  REFERRING PROVIDER: Ulysees Gander, DO  REFERRING DIAG:  Chronic left shoulder pain; Chronic pain of left knee; Chronic bilateral low back pain with left-sided sciatica  THERAPY DIAG:  Chronic left shoulder pain  Muscle weakness (generalized)  Rationale for Evaluation and Treatment: Rehabilitation  ONSET DATE: Chronic for a "couple of months"   SUBJECTIVE SUBJECTIVE STATEMENT: Patient reports she doesn't have the all over pain anymore since stopping the medication. She did do a lot since her last visit so the left shoulder is pretty sore today, and she  had a mammogram which they had her moving her left arm all around which aggravated it.   Eval: Patient reports one day she woke up and her left shoulder was hurting and swollen. The pain has pretty much stayed the same since it started. The left shoulder and arm feel heavy and achy. Sometimes she can move it around but sometimes it feels locked and painful when moving it. She did have an injection earlier this week and it is a little better, but still having some pain but not as bad as when she saw the doctor. She does report the shoulder will crack and pop when she moves it. Pain occurs with all movements overhead, out to the side, and behind back. Her left side of the neck also bothers her. She reports that her whole left arm occasionally goes numb and the middle finger is always numb. She reports difficulty sleeping because of the left shoulder.   Patient also reports neuropathy in both feet and legs, swelling in her legs, a lot of pain in her knees and hips, achilles tendinoplasty on the left, and L4-5 infusion with chronic lower back pain that comes and goes.   PERTINENT HISTORY: See PMH above  PAIN:  Are you having pain? Yes:  NPRS scale: 6/10 currently, 8/10 at worst Pain location: Left shoulder Pain description: Heavy, achy, sore Aggravating factors: Moving the left shoulder Relieving factors: Medication, topical cream and patches, ice  PRECAUTIONS: None  PATIENT GOALS: Pain relief and improve use of left arm   OBJECTIVE:  Note: Objective measures were completed at Evaluation unless otherwise noted. PATIENT SURVEYS:  Quick Dash 70.5%    SENSATION: Patient reports sensation deficit of left middle finger, otherwise all WFL but she does not occasional numbness of the left arm  POSTURE: Rounded shoulder posture  CERVICAL ROM:   Active ROM A/PROM (deg) eval  Flexion 35  Extension 20  Right lateral flexion   Left lateral flexion   Right rotation 45  Left rotation 65    (Blank rows = not tested)  UPPER EXTREMITY ROM:   Active ROM Right eval Left eval Left 10/30/2023 Left 11/07/2023  Shoulder flexion 140 120 135 135  Shoulder extension      Shoulder abduction 160 120 140   Shoulder adduction      Shoulder internal rotation T8 L1    Shoulder external rotation 60 / T4 50 / C7    Elbow flexion      Elbow extension      Wrist flexion      Wrist extension      Wrist ulnar deviation      Wrist radial deviation      Wrist pronation      Wrist supination      (Blank rows = not tested)  UPPER EXTREMITY MMT:  MMT Right eval Left eval  Shoulder flexion 4+ 3+  Shoulder extension    Shoulder abduction 4+ 3+  Shoulder adduction    Shoulder internal rotation  4  Shoulder external rotation 4+ 4-  Middle trapezius    Lower trapezius    Elbow flexion 5 4  Elbow extension 5 4  Wrist flexion    Wrist extension    Wrist ulnar deviation    Wrist radial deviation    Wrist pronation    Wrist supination    Grip strength (lbs) 49 25  (Blank rows = not tested)  SHOULDER SPECIAL TESTS: Not tested due to symptom irritability  JOINT MOBILITY TESTING:  Hypomobility of the left GHJ and with cervical CPAs  PALPATION:  Tenderness noted left upper trap, middle tap, rhomboid, infraspinatus, greater tubercle, bicep tendon                                                                                                                             TREATMENT  [facility] Adult PT Treatment:                                                DATE: 11/07/2023 UBE L1 x 4 min (fwd) to improve endurance and workload capacity STM/TPR for left upper trap region Seated table slide using FR 2 x 5 x 10 sec Seated upper trap stretch 3 x 15 sec on left Seated shoulder blade squeeze 5 x 5 sec Standing wall slide 2 x 5 x 5 sec Row with red 2 x 10 Standing shoulder ER isometric 10 x 5 sec on left  Trigger Point Dry Needling  Subsequent Treatment: Instructions provided previously at  initial dry needling treatment.  Instructions reviewed, if requested by the patient, prior to subsequent dry needling treatment.   Patient Verbal Consent Given: Yes Education Handout Provided: Previously Provided Muscles Treated: Left upper trap, middle and anterior deltoid, infraspinatus Electrical Stimulation Performed: No Treatment Response/Outcome: Twitch response   PATIENT EDUCATION: Education details: HEP, TPDN Person educated: Patient Education method: Explanation, Demonstration, Tactile cues, Verbal cues Education comprehension: verbalized understanding, returned demonstration, verbal cues required, tactile cues required, and needs further education  HOME EXERCISE PROGRAM: Access Code: [email or web address]   ASSESSMENT: CLINICAL IMPRESSION: Patient tolerated therapy well with no adverse effects. Continued with TPRN for the left upper trap, deltoid and rotator cuff with good therapeutic benefit. She did report post treatment soreness. Therapy focused on progressing left shoulder motion and strengthening as tolerated. She did well with table slide and wall slide, demonstrating equal shoulder elevation bilaterally without report of popping. She does continue to exhibit limitations with her active shoulder motion with continue popping of the anterior shoulder region that seems to be AC joint. She did report increased anterior shoulder pain with ER isometric and requires cueing to avoid shoulder shrug. No changes made to her HEP this visit. Patient would benefit from continued skilled PT to progress mobility and strength in order to reduce pain and maximize functional ability.  Eval: Patient is a 68 y.o. female who was seen today for physical therapy evaluation and treatment for chronic left shoulder and neck pain with left arm and hand numbness. She did exhibit high irritability level of left shoulder symptoms with limitations in shoulder A/PROM and strength due to pain that are impacting her  functional ability. Her left shoulder symptoms seem multifactorial but most consistent with rotator cuff tendinopathy and likely left cervical radiculopathy. She also exhibits tenderness of left cervical and shoulder region and performed TPDN this visit for left upper trap with twitch response. She was previously provided HEP for left shoulder but also provided her with neck stretching and postural exercise this visit.  OBJECTIVE IMPAIRMENTS: decreased activity tolerance, decreased ROM, decreased strength, postural dysfunction, and pain.   ACTIVITY LIMITATIONS: carrying, lifting, sleeping, bathing, dressing, reach over head, and hygiene/grooming  PARTICIPATION LIMITATIONS: meal prep, cleaning, laundry, shopping, and community activity  PERSONAL FACTORS: Fitness, Past/current experiences, and Time since onset of injury/illness/exacerbation are also affecting patient's functional outcome.    GOALS: Goals reviewed with patient? Yes  SHORT TERM GOALS: Target date: 11/15/2023  Patient will be I with initial HEP in order to progress with therapy. Baseline: HEP provided at eval Goal status: INITIAL  2.  Patient will report left shoulder pain <= 6/10 in when using left arm order to reduce functional limitations Baseline: 8/10 Goal status: INITIAL  3.  Patient will demonstrate left cervical rotation >/= 60 deg in order to reduce neck tension and pain Baseline: 45 deg Goal status: INITIAL  LONG TERM GOALS: Target date: 12/13/2023  Patient will be I with final HEP to maintain progress from PT. Baseline: HEP provided at eval Goal status: INITIAL  2.  Patient will report QuickDASH </= 45% in order to indicate an improvement in their functional status Baseline: 70.5% Goal status: INITIAL  3.  Patient will demonstrate improved range of motion of shoulder elevation >/= 140 deg in order to improve ability to perform dressing and grooming tasks Baseline: 120 deg Goal status: INITIAL  4.  Patient  will demonstrate left shoulder strength >/= 4/5 MMT in order to improve activity tolerance and use of left arm with household tasks Baseline: see limitations above Goal status: INITIAL   PLAN: PT FREQUENCY: 1-2x/week  PT DURATION: 8 weeks  PLANNED INTERVENTIONS: 97164- PT Re-evaluation, 97110-Therapeutic exercises, 97530- Therapeutic activity, 97112- Neuromuscular re-education, 97535- Self Care, 16109- Manual therapy, 97116- Gait training, Patient/Family education, Taping, Dry Needling, Joint mobilization, Joint manipulation, Spinal manipulation, Spinal mobilization, Cryotherapy, and Moist heat  PLAN FOR NEXT SESSION: Review HEP and progress PRN, manual/TPDN for left neck and shoulder region, left shoulder mobs and PROM, progress left shoulder AAROM->AROM and strengthening as tolerated, postural control; further assessment of cervical and neurodynamic testing   Leah Primus, PT, DPT, LAT, ATC 11/07/23  11:55 AM Phone: [phone] Fax: [phone]

## 2023-11-08 NOTE — Progress Notes (Unsigned)
 [name] [name] [address] [place] [address] Phone: [phone]   Assessment and Plan:     There are no diagnoses linked to this encounter.  ***   Pertinent previous records reviewed include ***    Follow Up: ***     Subjective:   I, Frances Nelson, am serving as a Neurosurgeon for Doctor Ulysees Gander  Chief Complaint: left shoulder pain    HPI:    10/15/2023 Patient is a 68 year old female with left shoulder pain. Patient states has been having some dizziness.  Pain keeps her up at night. Try to sleep on back as much as possible. Wakes up lying on her left side. Worse when she gets up in the morning but the pain is consistent all day.   Duration?about a month now Did you have an Injury to cause this pain? no Taking Medication for pain? Tylenol  arthritis, tramadol  Numbness or Tingling?yes second finger completely numb Does the pain Radiate? Yes (from neck down to fingers) Altered gait or use? yes ROM/ impairment of movement? All ranges are painful   11/09/2023 Patient state   Relevant Historical Information: Hypertension, GERD, COPD, history of positive ANA, status post lumbar fusion    Additional pertinent review of systems negative.   Current Outpatient Medications:    acetaminophen  (TYLENOL ) 650 MG CR tablet, Take 1,300 mg by mouth as needed., Disp: , Rfl:    acidophilus (RISAQUAD) CAPS capsule, Take 1 capsule by mouth in the morning., Disp: , Rfl:    Adapalene  (DIFFERIN ) 0.3 % gel, Apply 1 Application topically at bedtime. Apply a pea sized amount to face and behind ears 3 nights weekly, Disp: 45 g, Rfl: 2   alendronate  (FOSAMAX ) 70 MG tablet, Take 1 tablet (70 mg total) by mouth every 7 (seven) days. Take with a full glass of water on an empty stomach., Disp: 12 tablet, Rfl: 3   B Complex Vitamins (VITAMIN-B COMPLEX PO), Take by mouth daily., Disp: , Rfl:    Calcium -Vitamins C & D (CALCIUM /C/D PO), Take 1  tablet by mouth in the morning., Disp: , Rfl:    cetirizine (ZYRTEC) 10 MG tablet, Take 10 mg by mouth in the morning., Disp: , Rfl:    Cholecalciferol (HM VITAMIN D3 PO), Take 5,000 Units by mouth in the morning., Disp: , Rfl:    clindamycin  (CLEOCIN  T) 1 % SWAB, APPLY 1 APPLICATION TOPICALLY IN THE MORNING. WIPE ON FACE AND BEHIND EARS EVERY MORNING, Disp: 60 each, Rfl: 2   clobetasol  cream (TEMOVATE ) 0.05 %, Apply 1 Application topically 2 (two) times daily. Apply to affected areas 2 x daily for 2 weeks then stop. Use as needed (Patient taking differently: Apply 1 Application topically as needed. Apply to affected areas 2 x daily for 2 weeks then stop. Use as needed), Disp: 45 g, Rfl: 0   COLLAGEN PO, Take by mouth. Collagen Peptides, Disp: , Rfl:    doxycycline  (VIBRAMYCIN ) 50 MG capsule, Take 1 capsule (50 mg total) by mouth daily. With food and plenty of fluid, Disp: 30 capsule, Rfl: 3   DULoxetine  (CYMBALTA ) 30 MG capsule, TAKE 1 TAB BY MOUTH IN THE AM AND 2 IN THE PM, Disp: 270 capsule, Rfl: 4   ezetimibe  (ZETIA ) 10 MG tablet, Take 1 tablet (10 mg total) by mouth daily., Disp: 90 tablet, Rfl: 3   gabapentin  (NEURONTIN ) 100 MG capsule, Take 1 capsule (100 mg total) by mouth 3 (three) times daily., Disp:  90 capsule, Rfl: 5   Homeopathic Products (ZICAM ALLERGY RELIEF NA), Place into the nose., Disp: , Rfl:    hydrochlorothiazide  (HYDRODIURIL ) 12.5 MG tablet, TAKE 1 TABLET BY MOUTH EVERY DAY (Patient taking differently: Take 12.5 mg by mouth as needed.), Disp: 90 tablet, Rfl: 2   ipratropium-albuterol  (DUONEB) 0.5-2.5 (3) MG/3ML SOLN, Inhale 3 mLs into the lungs in the morning, at noon, in the evening, and at bedtime., Disp: 360 mL, Rfl: 3   lidocaine  (LIDODERM ) 5 %, Place 1 patch onto the skin daily. Remove & Discard patch within 12 hours or as directed by MD, Disp: 30 patch, Rfl: 0   losartan  (COZAAR ) 25 MG tablet, TAKE 1 TABLET (25 MG TOTAL) BY MOUTH DAILY., Disp: 90 tablet, Rfl: 2   MELATONIN  PO, Take 5 mg by mouth at bedtime., Disp: , Rfl:    meloxicam  (MOBIC ) 15 MG tablet, Take 0.5 tablets (7.5 mg total) by mouth daily. (Patient taking differently: Take 7.5 mg by mouth as needed.), Disp: 60 tablet, Rfl: 0   methocarbamol  (ROBAXIN ) 750 MG tablet, TAKE 1 TABLET (750 MG TOTAL) BY MOUTH EVERY 6 (SIX) HOURS AS NEEDED FOR MUSCLE SPASMS (NOT COVERED), Disp: 120 tablet, Rfl: 2   mometasone  (NASONEX ) 50 MCG/ACT nasal spray, Place 2 sprays into the nose every evening., Disp: , Rfl:    montelukast  (SINGULAIR ) 10 MG tablet, TAKE 1 TABLET BY MOUTH EVERYDAY AT BEDTIME, Disp: 90 tablet, Rfl: 3   Multiple Vitamins-Minerals (MULTIVITAMIN WITH MINERALS) tablet, Take 1 tablet by mouth in the morning., Disp: , Rfl:    omeprazole  (PRILOSEC) 40 MG capsule, TAKE 1 CAPSULE (40 MG TOTAL) BY MOUTH IN THE MORNING AND AT BEDTIME., Disp: 180 capsule, Rfl: 3   phentermine  30 MG capsule, Take 1 capsule (30 mg total) by mouth every morning., Disp: 30 capsule, Rfl: 5   rosuvastatin  (CRESTOR ) 10 MG tablet, Take 1 tablet (10 mg total) by mouth daily., Disp: 90 tablet, Rfl: 3   topiramate  (TOPAMAX ) 50 MG tablet, Take 1 tablet (50 mg total) by mouth 2 (two) times daily., Disp: 120 tablet, Rfl: 5   traMADol  (ULTRAM ) 50 MG tablet, Take 1-2 tablets (50-100 mg total) by mouth every 6 (six) hours as needed., Disp: 30 tablet, Rfl: 0  Current Facility-Administered Medications:    triamcinolone  acetonide (KENALOG ) 10 MG/ML injection 10 mg, 10 mg, Intradermal, Once, Dellar Fenton, DO   Objective:     There were no vitals filed for this visit.    There is no height or weight on file to calculate BMI.    Physical Exam:    ***   Electronically signed by:  Marshall Skeeter [name] 7:45 AM 11/08/23

## 2023-11-09 ENCOUNTER — Ambulatory Visit (INDEPENDENT_AMBULATORY_CARE_PROVIDER_SITE_OTHER): Admitting: Physical Therapy

## 2023-11-09 ENCOUNTER — Encounter: Payer: Self-pay | Admitting: Physical Therapy

## 2023-11-09 ENCOUNTER — Ambulatory Visit: Admitting: Sports Medicine

## 2023-11-09 ENCOUNTER — Other Ambulatory Visit: Payer: Self-pay

## 2023-11-09 VITALS — BP 132/86 | HR 82 | Ht 64.0 in | Wt 220.0 lb

## 2023-11-09 DIAGNOSIS — M25512 Pain in left shoulder: Secondary | ICD-10-CM | POA: Diagnosis not present

## 2023-11-09 DIAGNOSIS — G8929 Other chronic pain: Secondary | ICD-10-CM

## 2023-11-09 DIAGNOSIS — M7542 Impingement syndrome of left shoulder: Secondary | ICD-10-CM

## 2023-11-09 DIAGNOSIS — M6281 Muscle weakness (generalized): Secondary | ICD-10-CM | POA: Diagnosis not present

## 2023-11-09 MED ORDER — CELECOXIB 100 MG PO CAPS: 100.0000 mg | ORAL_CAPSULE | Freq: Two times a day (BID) | ORAL | 1 refills | Status: DC

## 2023-11-09 NOTE — Patient Instructions (Signed)
 Celebrex 100 mg 2 x daily for 1 week If you are improving continue at same dose If you are not improving increase to Celebrex 200 mg 2x daily  Celebrex 100 mg 60 1 Continue PT  4 week follow up

## 2023-11-09 NOTE — Therapy (Addendum)
 OUTPATIENT PHYSICAL THERAPY TREATMENT   Patient Name: Frances Nelson MRN: [id number] DOB:1956-05-21, 68 y.o., female Today's Date: 11/09/2023   END OF SESSION:  PT End of Session - 11/09/23 1038     Visit Number 6    Number of Visits 17    Date for PT Re-Evaluation 12/13/23    Authorization Type MCR    Progress Note Due on Visit 10    PT Start Time 1017    PT Stop Time 1058    PT Time Calculation (min) 41 min    Activity Tolerance Patient tolerated treatment well    Behavior During Therapy WFL for tasks assessed/performed                  Past Medical History:  Diagnosis Date   Arthritis    Asthma    COPD (chronic obstructive pulmonary disease) (HCC)    GERD (gastroesophageal reflux disease)    Hypertension    Pneumonia    Past Surgical History:  Procedure Laterality Date   ABDOMINAL HYSTERECTOMY     APPENDECTOMY     HERNIA REPAIR     Umbilical x 3   KNEE ARTHROSCOPY W/ MENISCAL REPAIR Left 12/2022   TONSILLECTOMY     TRANSFORAMINAL LUMBAR INTERBODY FUSION (TLIF) WITH PEDICLE SCREW FIXATION 1 LEVEL N/A 05/18/2022   Procedure: Lumbar Four-Five Open Laminectomy/Transforaminal Lumbar Interbody Fusion/Posterolateral fusion;  Surgeon: Cannon Champion, MD;  Location: [facility] OR;  Service: Neurosurgery;  Laterality: N/A;   Patient Active Problem List   Diagnosis Date Noted   Obesity 10/13/2023   Shingles outbreak 05/29/2023   Microhematuria 04/09/2023   Bilateral shoulder pain 04/09/2023   Urethritis 10/05/2022   External otitis of left ear 10/05/2022   Spondylolisthesis of lumbar region 05/18/2022   Smoker 04/08/2022   Deviated nasal septum 04/08/2022   Bilateral pain of leg and foot 04/08/2022   Follicular acne 04/06/2022   Baker cyst, left 04/06/2022   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   Depression 10/04/2021   Left knee pain 10/04/2021   Chronic pain 04/09/2021   Pelvic pain 04/09/2021   HLD (hyperlipidemia)  04/09/2021   Vitamin D  deficiency 04/09/2021   Estrogen deficiency 04/09/2021   Hyperglycemia 04/01/2021   Chronic cough 12/30/2020   GERD (gastroesophageal reflux disease) 11/06/2019   Bilateral leg edema 10/16/2019   Pleuritic chest pain 10/16/2019   Hypertension 10/02/2019   Arthralgia 06/28/2018   Sinusitis 06/28/2018   Renal cyst 05/03/2018   Syncope 05/03/2018   Stress and adjustment reaction 05/03/2018   History of tobacco use 05/02/2018   Primary osteoarthritis of both hands 04/10/2018   Primary osteoarthritis of both knees 04/10/2018   Primary osteoarthritis of both feet 04/10/2018   Achilles tendinitis 04/02/2018   Screening for breast cancer 03/15/2018   Positive ANA (antinuclear antibody) 03/15/2018   Screening for colon cancer 03/15/2018   Rash 03/15/2018   Achilles tendon pain 11/13/2017   Cervical myelopathy with cervical radiculopathy (HCC) 11/13/2017   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   Allergic rhinitis 06/19/2017   Edema 12/24/2015   Carpal tunnel syndrome, right 12/16/2015   Hypercalcemia 11/11/2015   Pain in both upper extremities 11/11/2015   Drug reaction 10/28/2015   Muscle spasms of both lower extremities 10/28/2015   Nerve pain 10/28/2015   Chronic asthmatic bronchitis with acute exacerbation (HCC) 09/06/2015   Environmental and seasonal allergies 07/17/2014    PCP: Roslyn Coombe, MD  REFERRING PROVIDER: Ulysees Gander, DO  REFERRING  DIAG: Chronic left shoulder pain; Chronic pain of left knee; Chronic bilateral low back pain with left-sided sciatica  THERAPY DIAG:  Chronic left shoulder pain  Muscle weakness (generalized)  Rationale for Evaluation and Treatment: Rehabilitation  ONSET DATE: Chronic for a "couple of months"   SUBJECTIVE SUBJECTIVE STATEMENT: Patient reports she is better than last visit but still having pain.   Eval: Patient reports one day she woke up and her left shoulder was hurting and swollen. The pain has  pretty much stayed the same since it started. The left shoulder and arm feel heavy and achy. Sometimes she can move it around but sometimes it feels locked and painful when moving it. She did have an injection earlier this week and it is a little better, but still having some pain but not as bad as when she saw the doctor. She does report the shoulder will crack and pop when she moves it. Pain occurs with all movements overhead, out to the side, and behind back. Her left side of the neck also bothers her. She reports that her whole left arm occasionally goes numb and the middle finger is always numb. She reports difficulty sleeping because of the left shoulder.   Patient also reports neuropathy in both feet and legs, swelling in her legs, a lot of pain in her knees and hips, achilles tendinoplasty on the left, and L4-5 infusion with chronic lower back pain that comes and goes.   PERTINENT HISTORY: See PMH above  PAIN:  Are you having pain? Yes:  NPRS scale: 6/10 currently, 8/10 at worst Pain location: Left shoulder Pain description: Heavy, achy, sore Aggravating factors: Moving the left shoulder Relieving factors: Medication, topical cream and patches, ice  PRECAUTIONS: None  PATIENT GOALS: Pain relief and improve use of left arm   OBJECTIVE:  Note: Objective measures were completed at Evaluation unless otherwise noted. PATIENT SURVEYS:  Quick Dash 70.5%    SENSATION: Patient reports sensation deficit of left middle finger, otherwise all WFL but she does not occasional numbness of the left arm  POSTURE: Rounded shoulder posture  CERVICAL ROM:   Active ROM A/PROM (deg) eval  Flexion 35  Extension 20  Right lateral flexion   Left lateral flexion   Right rotation 45  Left rotation 65   (Blank rows = not tested)  UPPER EXTREMITY ROM:   Active ROM Right eval Left eval Left 10/30/2023 Left 11/07/2023  Shoulder flexion 140 120 135 135  Shoulder extension      Shoulder  abduction 160 120 140   Shoulder adduction      Shoulder internal rotation T8 L1    Shoulder external rotation 60 / T4 50 / C7    Elbow flexion      Elbow extension      Wrist flexion      Wrist extension      Wrist ulnar deviation      Wrist radial deviation      Wrist pronation      Wrist supination      (Blank rows = not tested)  UPPER EXTREMITY MMT:  MMT Right eval Left eval  Shoulder flexion 4+ 3+  Shoulder extension    Shoulder abduction 4+ 3+  Shoulder adduction    Shoulder internal rotation  4  Shoulder external rotation 4+ 4-  Middle trapezius    Lower trapezius    Elbow flexion 5 4  Elbow extension 5 4  Wrist flexion    Wrist extension  Wrist ulnar deviation    Wrist radial deviation    Wrist pronation    Wrist supination    Grip strength (lbs) 49 25  (Blank rows = not tested)  SHOULDER SPECIAL TESTS: Not tested due to symptom irritability  JOINT MOBILITY TESTING:  Hypomobility of the left GHJ and with cervical CPAs  PALPATION:  Tenderness noted left upper trap, middle tap, rhomboid, infraspinatus, greater tubercle, bicep tendon                                                                                                                             TREATMENT  [facility] Adult PT Treatment:                                                DATE: 11/09/2023 UBE L1 x 4 min (fwd) to improve endurance and workload capacity STM/TPR for left upper trap region Seated upper trap stretch 3 x 15 sec each KT tape for left upper trap muscle support Left GHJ and AC joint mobs all directions to tolerance Left shoulder PROM all directions Seated table slide using FR 2 x 5 x 10 sec Standing wall slide 10 x 5 sec Row with red 2 x 10 Shoulder ER with yellow 2 x 10  PATIENT EDUCATION: Education details: HEP Person educated: Patient Education method: Programmer, multimedia, Demonstration, Actor cues, Verbal cues Education comprehension: verbalized understanding, returned  demonstration, verbal cues required, tactile cues required, and needs further education  HOME EXERCISE PROGRAM: Access Code: [email or web address]   ASSESSMENT: CLINICAL IMPRESSION: Patient tolerated therapy well with no adverse effects. She arrives reporting continued pain and clicking of the left shoulder. Therapy continued to focus on improving her upper trap mobility and progressing left shoulder mobility and strength. Applied KT tape to the left upper trap for muscle support and to reduce pain. She continues to report pain with shoulder and AC joint mobs, and pain with rotator cuff strengthening. Overall she has improved with her left shoulder motion but does continue to have clicking with active shoulder motion that seems to be at the [facility] joint. She does require consistent cueing for posture and avoid shoulder shrug on the left. No changes were made to her HEP this visit. Patient would benefit from continued skilled PT to progress mobility and strength in order to reduce pain and maximize functional ability.   Eval: Patient is a 68 y.o. female who was seen today for physical therapy evaluation and treatment for chronic left shoulder and neck pain with left arm and hand numbness. She did exhibit high irritability level of left shoulder symptoms with limitations in shoulder A/PROM and strength due to pain that are impacting her functional ability. Her left shoulder symptoms seem multifactorial but most consistent with rotator cuff tendinopathy and likely left cervical radiculopathy. She also exhibits tenderness of left  cervical and shoulder region and performed TPDN this visit for left upper trap with twitch response. She was previously provided HEP for left shoulder but also provided her with neck stretching and postural exercise this visit.  OBJECTIVE IMPAIRMENTS: decreased activity tolerance, decreased ROM, decreased strength, postural dysfunction, and pain.   ACTIVITY LIMITATIONS: carrying, lifting,  sleeping, bathing, dressing, reach over head, and hygiene/grooming  PARTICIPATION LIMITATIONS: meal prep, cleaning, laundry, shopping, and community activity  PERSONAL FACTORS: Fitness, Past/current experiences, and Time since onset of injury/illness/exacerbation are also affecting patient's functional outcome.    GOALS: Goals reviewed with patient? Yes  SHORT TERM GOALS: Target date: 11/15/2023  Patient will be I with initial HEP in order to progress with therapy. Baseline: HEP provided at eval Goal status: INITIAL  2.  Patient will report left shoulder pain <= 6/10 in when using left arm order to reduce functional limitations Baseline: 8/10 Goal status: INITIAL  3.  Patient will demonstrate left cervical rotation >/= 60 deg in order to reduce neck tension and pain Baseline: 45 deg Goal status: INITIAL  LONG TERM GOALS: Target date: 12/13/2023  Patient will be I with final HEP to maintain progress from PT. Baseline: HEP provided at eval Goal status: INITIAL  2.  Patient will report QuickDASH </= 45% in order to indicate an improvement in their functional status Baseline: 70.5% Goal status: INITIAL  3.  Patient will demonstrate improved range of motion of shoulder elevation >/= 140 deg in order to improve ability to perform dressing and grooming tasks Baseline: 120 deg Goal status: INITIAL  4.  Patient will demonstrate left shoulder strength >/= 4/5 MMT in order to improve activity tolerance and use of left arm with household tasks Baseline: see limitations above Goal status: INITIAL   PLAN: PT FREQUENCY: 1-2x/week  PT DURATION: 8 weeks  PLANNED INTERVENTIONS: 97164- PT Re-evaluation, 97110-Therapeutic exercises, 97530- Therapeutic activity, 97112- Neuromuscular re-education, 97535- Self Care, 16109- Manual therapy, 97116- Gait training, Patient/Family education, Taping, Dry Needling, Joint mobilization, Joint manipulation, Spinal manipulation, Spinal mobilization,  Cryotherapy, and Moist heat  PLAN FOR NEXT SESSION: Review HEP and progress PRN, manual/TPDN for left neck and shoulder region, left shoulder mobs and PROM, progress left shoulder AAROM->AROM and strengthening as tolerated, postural control; further assessment of cervical and neurodynamic testing   Leah Primus, PT, DPT, LAT, ATC 11/09/23  11:12 AM Phone: [phone] Fax: [phone]

## 2023-11-10 ENCOUNTER — Other Ambulatory Visit: Payer: Self-pay | Admitting: Internal Medicine

## 2023-11-13 ENCOUNTER — Ambulatory Visit (INDEPENDENT_AMBULATORY_CARE_PROVIDER_SITE_OTHER): Admitting: Physical Therapy

## 2023-11-13 ENCOUNTER — Encounter: Payer: Self-pay | Admitting: Physical Therapy

## 2023-11-13 ENCOUNTER — Other Ambulatory Visit: Payer: Self-pay

## 2023-11-13 DIAGNOSIS — M6281 Muscle weakness (generalized): Secondary | ICD-10-CM | POA: Diagnosis not present

## 2023-11-13 DIAGNOSIS — G8929 Other chronic pain: Secondary | ICD-10-CM | POA: Diagnosis not present

## 2023-11-13 DIAGNOSIS — M25512 Pain in left shoulder: Secondary | ICD-10-CM

## 2023-11-13 NOTE — Patient Instructions (Signed)
 Access Code: [email or web address] URL: [email or web address] Date: 11/13/2023 Prepared by: Leah Primus  Exercises - Gentle Upper Trap Stretch  - 2-3 x daily - 3 reps - 15 secnds hold - Supine Shoulder Flexion Extension AAROM with Dowel  - 2 x daily - 2 sets - 10 reps - Sidelying Shoulder External Rotation AROM  - 1-2 x daily - 3 sets - 10 reps - Standing shoulder flexion wall slides  - 2 x daily - 2 sets - 10 reps - Standing Row with Anchored Resistance  - 1 x daily - 3 sets - 10 reps

## 2023-11-13 NOTE — Therapy (Signed)
 OUTPATIENT PHYSICAL THERAPY TREATMENT   Patient Name: Frances Nelson MRN: [id number] DOB:01/26/1956, 68 y.o., female Today's Date: 11/13/2023   END OF SESSION:  PT End of Session - 11/13/23 1106     Visit Number 7    Number of Visits 17    Date for PT Re-Evaluation 12/13/23    Authorization Type MCR    Progress Note Due on Visit 10    PT Start Time 1101    PT Stop Time 1145    PT Time Calculation (min) 44 min    Activity Tolerance Patient tolerated treatment well    Behavior During Therapy WFL for tasks assessed/performed                   Past Medical History:  Diagnosis Date   Arthritis    Asthma    COPD (chronic obstructive pulmonary disease) (HCC)    GERD (gastroesophageal reflux disease)    Hypertension    Pneumonia    Past Surgical History:  Procedure Laterality Date   ABDOMINAL HYSTERECTOMY     APPENDECTOMY     HERNIA REPAIR     Umbilical x 3   KNEE ARTHROSCOPY W/ MENISCAL REPAIR Left 12/2022   TONSILLECTOMY     TRANSFORAMINAL LUMBAR INTERBODY FUSION (TLIF) WITH PEDICLE SCREW FIXATION 1 LEVEL N/A 05/18/2022   Procedure: Lumbar Four-Five Open Laminectomy/Transforaminal Lumbar Interbody Fusion/Posterolateral fusion;  Surgeon: Cannon Champion, MD;  Location: [facility] OR;  Service: Neurosurgery;  Laterality: N/A;   Patient Active Problem List   Diagnosis Date Noted   Obesity 10/13/2023   Shingles outbreak 05/29/2023   Microhematuria 04/09/2023   Bilateral shoulder pain 04/09/2023   Urethritis 10/05/2022   External otitis of left ear 10/05/2022   Spondylolisthesis of lumbar region 05/18/2022   Smoker 04/08/2022   Deviated nasal septum 04/08/2022   Bilateral pain of leg and foot 04/08/2022   Follicular acne 04/06/2022   Baker cyst, left 04/06/2022   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   Depression 10/04/2021   Left knee pain 10/04/2021   Chronic pain 04/09/2021   Pelvic pain 04/09/2021   HLD (hyperlipidemia)  04/09/2021   Vitamin D  deficiency 04/09/2021   Estrogen deficiency 04/09/2021   Hyperglycemia 04/01/2021   Chronic cough 12/30/2020   GERD (gastroesophageal reflux disease) 11/06/2019   Bilateral leg edema 10/16/2019   Pleuritic chest pain 10/16/2019   Hypertension 10/02/2019   Arthralgia 06/28/2018   Sinusitis 06/28/2018   Renal cyst 05/03/2018   Syncope 05/03/2018   Stress and adjustment reaction 05/03/2018   History of tobacco use 05/02/2018   Primary osteoarthritis of both hands 04/10/2018   Primary osteoarthritis of both knees 04/10/2018   Primary osteoarthritis of both feet 04/10/2018   Achilles tendinitis 04/02/2018   Screening for breast cancer 03/15/2018   Positive ANA (antinuclear antibody) 03/15/2018   Screening for colon cancer 03/15/2018   Rash 03/15/2018   Achilles tendon pain 11/13/2017   Cervical myelopathy with cervical radiculopathy (HCC) 11/13/2017   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   Allergic rhinitis 06/19/2017   Edema 12/24/2015   Carpal tunnel syndrome, right 12/16/2015   Hypercalcemia 11/11/2015   Pain in both upper extremities 11/11/2015   Drug reaction 10/28/2015   Muscle spasms of both lower extremities 10/28/2015   Nerve pain 10/28/2015   Chronic asthmatic bronchitis with acute exacerbation (HCC) 09/06/2015   Environmental and seasonal allergies 07/17/2014    PCP: Roslyn Coombe, MD  REFERRING PROVIDER: Ulysees Gander, DO  REFERRING DIAG: Chronic left shoulder pain; Chronic pain of left knee; Chronic bilateral low back pain with left-sided sciatica  THERAPY DIAG:  Chronic left shoulder pain  Muscle weakness (generalized)  Rationale for Evaluation and Treatment: Rehabilitation  ONSET DATE: Chronic for a "couple of months"   SUBJECTIVE SUBJECTIVE STATEMENT: Patient reports the tape from last visit helped to loosen up the muscle but she is allergic to the adhesive so had some redness when she took it off. She states she is feeling a  little better today. She was prescribed some new medication by the doctor but has not picked it up yet.   Eval: Patient reports one day she woke up and her left shoulder was hurting and swollen. The pain has pretty much stayed the same since it started. The left shoulder and arm feel heavy and achy. Sometimes she can move it around but sometimes it feels locked and painful when moving it. She did have an injection earlier this week and it is a little better, but still having some pain but not as bad as when she saw the doctor. She does report the shoulder will crack and pop when she moves it. Pain occurs with all movements overhead, out to the side, and behind back. Her left side of the neck also bothers her. She reports that her whole left arm occasionally goes numb and the middle finger is always numb. She reports difficulty sleeping because of the left shoulder.   Patient also reports neuropathy in both feet and legs, swelling in her legs, a lot of pain in her knees and hips, achilles tendinoplasty on the left, and L4-5 infusion with chronic lower back pain that comes and goes.   PERTINENT HISTORY: See PMH above  PAIN:  Are you having pain? Yes:  NPRS scale: 4-5/10 currently, 8/10 at worst Pain location: Left shoulder Pain description: Heavy, achy, sore Aggravating factors: Moving the left shoulder Relieving factors: Medication, topical cream and patches, ice  PRECAUTIONS: None  PATIENT GOALS: Pain relief and improve use of left arm   OBJECTIVE:  Note: Objective measures were completed at Evaluation unless otherwise noted. PATIENT SURVEYS:  Quick Dash 70.5%    SENSATION: Patient reports sensation deficit of left middle finger, otherwise all WFL but she does not occasional numbness of the left arm  POSTURE: Rounded shoulder posture  CERVICAL ROM:   Active ROM A/PROM (deg) eval  Flexion 35  Extension 20  Right lateral flexion   Left lateral flexion   Right rotation 45   Left rotation 65   (Blank rows = not tested)  UPPER EXTREMITY ROM:   Active ROM Right eval Left eval Left 10/30/2023 Left 11/07/2023  Shoulder flexion 140 120 135 135  Shoulder extension      Shoulder abduction 160 120 140   Shoulder adduction      Shoulder internal rotation T8 L1    Shoulder external rotation 60 / T4 50 / C7    Elbow flexion      Elbow extension      Wrist flexion      Wrist extension      Wrist ulnar deviation      Wrist radial deviation      Wrist pronation      Wrist supination      (Blank rows = not tested)  UPPER EXTREMITY MMT:  MMT Right eval Left eval  Shoulder flexion 4+ 3+  Shoulder extension    Shoulder abduction 4+ 3+  Shoulder adduction  Shoulder internal rotation  4  Shoulder external rotation 4+ 4-  Middle trapezius    Lower trapezius    Elbow flexion 5 4  Elbow extension 5 4  Wrist flexion    Wrist extension    Wrist ulnar deviation    Wrist radial deviation    Wrist pronation    Wrist supination    Grip strength (lbs) 49 25  (Blank rows = not tested)  SHOULDER SPECIAL TESTS: Not tested due to symptom irritability  JOINT MOBILITY TESTING:  Hypomobility of the left GHJ and with cervical CPAs  PALPATION:  Tenderness noted left upper trap, middle tap, rhomboid, infraspinatus, greater tubercle, bicep tendon                                                                                                                             TREATMENT  [facility] Adult PT Treatment:                                                DATE: 11/13/2023 UBE L1 x 4 min (fwd/bwd) to improve endurance and workload capacity Seated upper trap stretch 3 x 15 sec each Sidelying shoulder ER 3 x 10 Standing wall slide 10 x 5 sec Row with red 3 x 10  Trigger Point Dry Needling  Subsequent Treatment: Instructions provided previously at initial dry needling treatment.  Instructions reviewed, if requested by the patient, prior to subsequent dry needling  treatment.   Patient Verbal Consent Given: Yes Education Handout Provided: Previously Provided Muscles Treated: Left upper trap, infraspinatus, posterior and middle deltoid Electrical Stimulation Performed: No Treatment Response/Outcome: Twitch response   PATIENT EDUCATION: Education details: HEP update Person educated: Patient Education method: Explanation, Demonstration, Tactile cues, Verbal cues, Handout Education comprehension: verbalized understanding, returned demonstration, verbal cues required, tactile cues required, and needs further education  HOME EXERCISE PROGRAM: Access Code: [email or web address]   ASSESSMENT: CLINICAL IMPRESSION: Patient tolerated therapy well with no adverse effects. Continued with TPDN for the left cervical and shoulder region. Therapy continued to progress left shoulder mobility and strengthening with fair tolerance. She reported more pain primarily in the posterior shoulder that seems to be infraspinatus related, and she does continue to experience the popping with active shoulder elevation that seems to be [facility] joint related. Updated her HEP this visit to incorporate rotator cuff exercise. Patient would benefit from continued skilled PT to progress mobility and strength in order to reduce pain and maximize functional ability.    Eval: Patient is a 68 y.o. female who was seen today for physical therapy evaluation and treatment for chronic left shoulder and neck pain with left arm and hand numbness. She did exhibit high irritability level of left shoulder symptoms with limitations in shoulder A/PROM and strength due to pain that are impacting her functional ability. Her left shoulder  symptoms seem multifactorial but most consistent with rotator cuff tendinopathy and likely left cervical radiculopathy. She also exhibits tenderness of left cervical and shoulder region and performed TPDN this visit for left upper trap with twitch response. She was previously provided HEP for  left shoulder but also provided her with neck stretching and postural exercise this visit.  OBJECTIVE IMPAIRMENTS: decreased activity tolerance, decreased ROM, decreased strength, postural dysfunction, and pain.   ACTIVITY LIMITATIONS: carrying, lifting, sleeping, bathing, dressing, reach over head, and hygiene/grooming  PARTICIPATION LIMITATIONS: meal prep, cleaning, laundry, shopping, and community activity  PERSONAL FACTORS: Fitness, Past/current experiences, and Time since onset of injury/illness/exacerbation are also affecting patient's functional outcome.    GOALS: Goals reviewed with patient? Yes  SHORT TERM GOALS: Target date: 11/15/2023  Patient will be I with initial HEP in order to progress with therapy. Baseline: HEP provided at eval Goal status: INITIAL  2.  Patient will report left shoulder pain <= 6/10 in when using left arm order to reduce functional limitations Baseline: 8/10 Goal status: INITIAL  3.  Patient will demonstrate left cervical rotation >/= 60 deg in order to reduce neck tension and pain Baseline: 45 deg Goal status: INITIAL  LONG TERM GOALS: Target date: 12/13/2023  Patient will be I with final HEP to maintain progress from PT. Baseline: HEP provided at eval Goal status: INITIAL  2.  Patient will report QuickDASH </= 45% in order to indicate an improvement in their functional status Baseline: 70.5% Goal status: INITIAL  3.  Patient will demonstrate improved range of motion of shoulder elevation >/= 140 deg in order to improve ability to perform dressing and grooming tasks Baseline: 120 deg Goal status: INITIAL  4.  Patient will demonstrate left shoulder strength >/= 4/5 MMT in order to improve activity tolerance and use of left arm with household tasks Baseline: see limitations above Goal status: INITIAL   PLAN: PT FREQUENCY: 1-2x/week  PT DURATION: 8 weeks  PLANNED INTERVENTIONS: 97164- PT Re-evaluation, 97110-Therapeutic exercises,  97530- Therapeutic activity, 97112- Neuromuscular re-education, 97535- Self Care, [id number]- Manual therapy, 97116- Gait training, Patient/Family education, Taping, Dry Needling, Joint mobilization, Joint manipulation, Spinal manipulation, Spinal mobilization, Cryotherapy, and Moist heat  PLAN FOR NEXT SESSION: Review HEP and progress PRN, manual/TPDN for left neck and shoulder region, left shoulder mobs and PROM, progress left shoulder AAROM->AROM and strengthening as tolerated, postural control; further assessment of cervical and neurodynamic testing   Leah Primus, PT, DPT, LAT, ATC 11/13/23  1:44 PM Phone: [phone] Fax: [phone]

## 2023-11-16 ENCOUNTER — Ambulatory Visit (INDEPENDENT_AMBULATORY_CARE_PROVIDER_SITE_OTHER): Admitting: Physical Therapy

## 2023-11-16 ENCOUNTER — Encounter: Payer: Self-pay | Admitting: Physical Therapy

## 2023-11-16 ENCOUNTER — Other Ambulatory Visit: Payer: Self-pay

## 2023-11-16 DIAGNOSIS — G8929 Other chronic pain: Secondary | ICD-10-CM | POA: Diagnosis not present

## 2023-11-16 DIAGNOSIS — M25512 Pain in left shoulder: Secondary | ICD-10-CM | POA: Diagnosis not present

## 2023-11-16 DIAGNOSIS — M6281 Muscle weakness (generalized): Secondary | ICD-10-CM

## 2023-11-16 NOTE — Therapy (Signed)
 OUTPATIENT PHYSICAL THERAPY TREATMENT   Patient Name: Frances Nelson MRN: [id number] DOB:25-Mar-1956, 68 y.o., female Today's Date: 11/16/2023   END OF SESSION:  PT End of Session - 11/16/23 1106     Visit Number 8    Number of Visits 17    Date for PT Re-Evaluation 12/13/23    Authorization Type MCR    Progress Note Due on Visit 10    PT Start Time 1104    PT Stop Time 1145    PT Time Calculation (min) 41 min    Activity Tolerance Patient tolerated treatment well    Behavior During Therapy WFL for tasks assessed/performed                    Past Medical History:  Diagnosis Date   Arthritis    Asthma    COPD (chronic obstructive pulmonary disease) (HCC)    GERD (gastroesophageal reflux disease)    Hypertension    Pneumonia    Past Surgical History:  Procedure Laterality Date   ABDOMINAL HYSTERECTOMY     APPENDECTOMY     HERNIA REPAIR     Umbilical x 3   KNEE ARTHROSCOPY W/ MENISCAL REPAIR Left 12/2022   TONSILLECTOMY     TRANSFORAMINAL LUMBAR INTERBODY FUSION (TLIF) WITH PEDICLE SCREW FIXATION 1 LEVEL N/A 05/18/2022   Procedure: Lumbar Four-Five Open Laminectomy/Transforaminal Lumbar Interbody Fusion/Posterolateral fusion;  Surgeon: Cannon Champion, MD;  Location: [facility] OR;  Service: Neurosurgery;  Laterality: N/A;   Patient Active Problem List   Diagnosis Date Noted   Obesity 10/13/2023   Shingles outbreak 05/29/2023   Microhematuria 04/09/2023   Bilateral shoulder pain 04/09/2023   Urethritis 10/05/2022   External otitis of left ear 10/05/2022   Spondylolisthesis of lumbar region 05/18/2022   Smoker 04/08/2022   Deviated nasal septum 04/08/2022   Bilateral pain of leg and foot 04/08/2022   Follicular acne 04/06/2022   Baker cyst, left 04/06/2022   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   Depression 10/04/2021   Left knee pain 10/04/2021   Chronic pain 04/09/2021   Pelvic pain 04/09/2021   HLD  (hyperlipidemia) 04/09/2021   Vitamin D  deficiency 04/09/2021   Estrogen deficiency 04/09/2021   Hyperglycemia 04/01/2021   Chronic cough 12/30/2020   GERD (gastroesophageal reflux disease) 11/06/2019   Bilateral leg edema 10/16/2019   Pleuritic chest pain 10/16/2019   Hypertension 10/02/2019   Arthralgia 06/28/2018   Sinusitis 06/28/2018   Renal cyst 05/03/2018   Syncope 05/03/2018   Stress and adjustment reaction 05/03/2018   History of tobacco use 05/02/2018   Primary osteoarthritis of both hands 04/10/2018   Primary osteoarthritis of both knees 04/10/2018   Primary osteoarthritis of both feet 04/10/2018   Achilles tendinitis 04/02/2018   Screening for breast cancer 03/15/2018   Positive ANA (antinuclear antibody) 03/15/2018   Screening for colon cancer 03/15/2018   Rash 03/15/2018   Achilles tendon pain 11/13/2017   Cervical myelopathy with cervical radiculopathy (HCC) 11/13/2017   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   Allergic rhinitis 06/19/2017   Edema 12/24/2015   Carpal tunnel syndrome, right 12/16/2015   Hypercalcemia 11/11/2015   Pain in both upper extremities 11/11/2015   Drug reaction 10/28/2015   Muscle spasms of both lower extremities 10/28/2015   Nerve pain 10/28/2015   Chronic asthmatic bronchitis with acute exacerbation (HCC) 09/06/2015   Environmental and seasonal allergies 07/17/2014    PCP: Roslyn Coombe, MD  REFERRING PROVIDER: Ulysees Gander, DO  REFERRING DIAG: Chronic left shoulder pain; Chronic pain of left knee; Chronic bilateral low back pain with left-sided sciatica  THERAPY DIAG:  Chronic left shoulder pain  Muscle weakness (generalized)  Rationale for Evaluation and Treatment: Rehabilitation  ONSET DATE: Chronic for a "couple of months"   SUBJECTIVE SUBJECTIVE STATEMENT: Patient reports she felt better after the needling last session but then the pain comes back and she seems to be having more clicking today in the left shoulder.     Eval: Patient reports one day she woke up and her left shoulder was hurting and swollen. The pain has pretty much stayed the same since it started. The left shoulder and arm feel heavy and achy. Sometimes she can move it around but sometimes it feels locked and painful when moving it. She did have an injection earlier this week and it is a little better, but still having some pain but not as bad as when she saw the doctor. She does report the shoulder will crack and pop when she moves it. Pain occurs with all movements overhead, out to the side, and behind back. Her left side of the neck also bothers her. She reports that her whole left arm occasionally goes numb and the middle finger is always numb. She reports difficulty sleeping because of the left shoulder.   Patient also reports neuropathy in both feet and legs, swelling in her legs, a lot of pain in her knees and hips, achilles tendinoplasty on the left, and L4-5 infusion with chronic lower back pain that comes and goes.   PERTINENT HISTORY: See PMH above  PAIN:  Are you having pain? Yes:  NPRS scale: 4-5/10 currently, 8/10 at worst Pain location: Left shoulder Pain description: Heavy, achy, sore Aggravating factors: Moving the left shoulder Relieving factors: Medication, topical cream and patches, ice  PRECAUTIONS: None  PATIENT GOALS: Pain relief and improve use of left arm   OBJECTIVE:  Note: Objective measures were completed at Evaluation unless otherwise noted. PATIENT SURVEYS:  Quick Dash 70.5%    SENSATION: Patient reports sensation deficit of left middle finger, otherwise all WFL but she does not occasional numbness of the left arm  POSTURE: Rounded shoulder posture  CERVICAL ROM:   Active ROM A/PROM (deg) eval  Flexion 35  Extension 20  Right lateral flexion   Left lateral flexion   Right rotation 45  Left rotation 65   (Blank rows = not tested)  UPPER EXTREMITY ROM:   Active ROM Right eval  Left eval Left 10/30/2023 Left 11/07/2023  Shoulder flexion 140 120 135 135  Shoulder extension      Shoulder abduction 160 120 140   Shoulder adduction      Shoulder internal rotation T8 L1    Shoulder external rotation 60 / T4 50 / C7    Elbow flexion      Elbow extension      Wrist flexion      Wrist extension      Wrist ulnar deviation      Wrist radial deviation      Wrist pronation      Wrist supination      (Blank rows = not tested)  UPPER EXTREMITY MMT:  MMT Right eval Left eval  Shoulder flexion 4+ 3+  Shoulder extension    Shoulder abduction 4+ 3+  Shoulder adduction    Shoulder internal rotation  4  Shoulder external rotation 4+ 4-  Middle trapezius    Lower trapezius  Elbow flexion 5 4  Elbow extension 5 4  Wrist flexion    Wrist extension    Wrist ulnar deviation    Wrist radial deviation    Wrist pronation    Wrist supination    Grip strength (lbs) 49 25  (Blank rows = not tested)  SHOULDER SPECIAL TESTS: Not tested due to symptom irritability  JOINT MOBILITY TESTING:  Hypomobility of the left GHJ and with cervical CPAs  PALPATION:  Tenderness noted left upper trap, middle tap, rhomboid, infraspinatus, greater tubercle, bicep tendon                                                                                                                             TREATMENT  [facility] Adult PT Treatment:                                                DATE: 11/16/2023 UBE L1 x 4 min (fwd/bwd) to improve endurance and workload capacity Sidelying shoulder ER 3 x 10 Sidelying shoulder abduction partial range to avoid shoulder click 2 x 10 Supine dowel shoulder press 2 x 10 Row with green 2 x 10 Extension with green 2 x 10 Standing wall slide 10 x 5 sec Trial of various MWM techniques for GHJ and AC joint with shoulder elevation but unable to resolve click or pain Standing shoulder isometrics flexion, abduction, extension 10 x 5 sec each  Patient educated on  anatomy of the shoulder and used images of her x-ray of the shoulder to educate on different joints of the shoulder and possible causes of clicking and pain.   PATIENT EDUCATION: Education details: HEP Person educated: Patient Education method: Solicitor, Actor cues, Verbal cues Education comprehension: verbalized understanding, returned demonstration, verbal cues required, tactile cues required, and needs further education  HOME EXERCISE PROGRAM: Access Code: [email or web address]   ASSESSMENT: CLINICAL IMPRESSION: Patient tolerated therapy well with no adverse effects. She continues to experience the clicking of her left shoulder with any active shoulder elevation and this is painful for her, and she reports continued tension and tightness of the left upper trap region. Therapy continues to work on improve her active shoulder motion and strengthening as tolerated. She does well with her wall slide exercise without any clicking but has pain with supine and standing shoulder elevation. Trial of different MWM techniques for the left GHJ and AC joint without benefit. She reports increased pain with shoulder isometrics in flexion and abduction. She has improved with her active left shoulder elevation but this movement remains painful for her. No changes were made to her HEP this visit. Patient would benefit from continued skilled PT to progress mobility and strength in order to reduce pain and maximize functional ability.    Eval: Patient is a 68 y.o. female who was seen today for  physical therapy evaluation and treatment for chronic left shoulder and neck pain with left arm and hand numbness. She did exhibit high irritability level of left shoulder symptoms with limitations in shoulder A/PROM and strength due to pain that are impacting her functional ability. Her left shoulder symptoms seem multifactorial but most consistent with rotator cuff tendinopathy and likely left cervical radiculopathy.  She also exhibits tenderness of left cervical and shoulder region and performed TPDN this visit for left upper trap with twitch response. She was previously provided HEP for left shoulder but also provided her with neck stretching and postural exercise this visit.  OBJECTIVE IMPAIRMENTS: decreased activity tolerance, decreased ROM, decreased strength, postural dysfunction, and pain.   ACTIVITY LIMITATIONS: carrying, lifting, sleeping, bathing, dressing, reach over head, and hygiene/grooming  PARTICIPATION LIMITATIONS: meal prep, cleaning, laundry, shopping, and community activity  PERSONAL FACTORS: Fitness, Past/current experiences, and Time since onset of injury/illness/exacerbation are also affecting patient's functional outcome.    GOALS: Goals reviewed with patient? Yes  SHORT TERM GOALS: Target date: 11/15/2023  Patient will be I with initial HEP in order to progress with therapy. Baseline: HEP provided at eval Goal status: INITIAL  2.  Patient will report left shoulder pain <= 6/10 in when using left arm order to reduce functional limitations Baseline: 8/10 Goal status: INITIAL  3.  Patient will demonstrate left cervical rotation >/= 60 deg in order to reduce neck tension and pain Baseline: 45 deg Goal status: INITIAL  LONG TERM GOALS: Target date: 12/13/2023  Patient will be I with final HEP to maintain progress from PT. Baseline: HEP provided at eval Goal status: INITIAL  2.  Patient will report QuickDASH </= 45% in order to indicate an improvement in their functional status Baseline: 70.5% Goal status: INITIAL  3.  Patient will demonstrate improved range of motion of shoulder elevation >/= 140 deg in order to improve ability to perform dressing and grooming tasks Baseline: 120 deg Goal status: INITIAL  4.  Patient will demonstrate left shoulder strength >/= 4/5 MMT in order to improve activity tolerance and use of left arm with household tasks Baseline: see  limitations above Goal status: INITIAL   PLAN: PT FREQUENCY: 1-2x/week  PT DURATION: 8 weeks  PLANNED INTERVENTIONS: 97164- PT Re-evaluation, 97110-Therapeutic exercises, 97530- Therapeutic activity, 97112- Neuromuscular re-education, 97535- Self Care, 16109- Manual therapy, 97116- Gait training, Patient/Family education, Taping, Dry Needling, Joint mobilization, Joint manipulation, Spinal manipulation, Spinal mobilization, Cryotherapy, and Moist heat  PLAN FOR NEXT SESSION: Review HEP and progress PRN, manual/TPDN for left neck and shoulder region, left shoulder mobs and PROM, progress left shoulder AAROM->AROM and strengthening as tolerated, postural control; further assessment of cervical and neurodynamic testing   Leah Primus, PT, DPT, LAT, ATC 11/16/23  11:55 AM Phone: [phone] Fax: [phone]

## 2023-11-18 ENCOUNTER — Other Ambulatory Visit: Payer: Self-pay | Admitting: Internal Medicine

## 2023-11-20 ENCOUNTER — Other Ambulatory Visit: Payer: Self-pay

## 2023-11-20 ENCOUNTER — Ambulatory Visit: Admitting: Physical Therapy

## 2023-11-20 ENCOUNTER — Encounter: Payer: Self-pay | Admitting: Physical Therapy

## 2023-11-20 DIAGNOSIS — G8929 Other chronic pain: Secondary | ICD-10-CM | POA: Diagnosis not present

## 2023-11-20 DIAGNOSIS — M25512 Pain in left shoulder: Secondary | ICD-10-CM

## 2023-11-20 DIAGNOSIS — M6281 Muscle weakness (generalized): Secondary | ICD-10-CM

## 2023-11-20 NOTE — Therapy (Signed)
 OUTPATIENT PHYSICAL THERAPY TREATMENT   Patient Name: Frances Nelson MRN: [id number] DOB:13-Oct-1955, 68 y.o., female Today's Date: 11/20/2023   END OF SESSION:  PT End of Session - 11/20/23 1153     Visit Number 9    Number of Visits 17    Date for PT Re-Evaluation 12/13/23    Authorization Type MCR    Progress Note Due on Visit 10    PT Start Time 1102    PT Stop Time 1145    PT Time Calculation (min) 43 min    Activity Tolerance Patient tolerated treatment well    Behavior During Therapy WFL for tasks assessed/performed                     Past Medical History:  Diagnosis Date   Arthritis    Asthma    COPD (chronic obstructive pulmonary disease) (HCC)    GERD (gastroesophageal reflux disease)    Hypertension    Pneumonia    Past Surgical History:  Procedure Laterality Date   ABDOMINAL HYSTERECTOMY     APPENDECTOMY     HERNIA REPAIR     Umbilical x 3   KNEE ARTHROSCOPY W/ MENISCAL REPAIR Left 12/2022   TONSILLECTOMY     TRANSFORAMINAL LUMBAR INTERBODY FUSION (TLIF) WITH PEDICLE SCREW FIXATION 1 LEVEL N/A 05/18/2022   Procedure: Lumbar Four-Five Open Laminectomy/Transforaminal Lumbar Interbody Fusion/Posterolateral fusion;  Surgeon: Cannon Champion, MD;  Location: [facility] OR;  Service: Neurosurgery;  Laterality: N/A;   Patient Active Problem List   Diagnosis Date Noted   Obesity 10/13/2023   Shingles outbreak 05/29/2023   Microhematuria 04/09/2023   Bilateral shoulder pain 04/09/2023   Urethritis 10/05/2022   External otitis of left ear 10/05/2022   Spondylolisthesis of lumbar region 05/18/2022   Smoker 04/08/2022   Deviated nasal septum 04/08/2022   Bilateral pain of leg and foot 04/08/2022   Follicular acne 04/06/2022   Baker cyst, left 04/06/2022   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   Depression 10/04/2021   Left knee pain 10/04/2021   Chronic pain 04/09/2021   Pelvic pain 04/09/2021   HLD  (hyperlipidemia) 04/09/2021   Vitamin D  deficiency 04/09/2021   Estrogen deficiency 04/09/2021   Hyperglycemia 04/01/2021   Chronic cough 12/30/2020   GERD (gastroesophageal reflux disease) 11/06/2019   Bilateral leg edema 10/16/2019   Pleuritic chest pain 10/16/2019   Hypertension 10/02/2019   Arthralgia 06/28/2018   Sinusitis 06/28/2018   Renal cyst 05/03/2018   Syncope 05/03/2018   Stress and adjustment reaction 05/03/2018   History of tobacco use 05/02/2018   Primary osteoarthritis of both hands 04/10/2018   Primary osteoarthritis of both knees 04/10/2018   Primary osteoarthritis of both feet 04/10/2018   Achilles tendinitis 04/02/2018   Screening for breast cancer 03/15/2018   Positive ANA (antinuclear antibody) 03/15/2018   Screening for colon cancer 03/15/2018   Rash 03/15/2018   Achilles tendon pain 11/13/2017   Cervical myelopathy with cervical radiculopathy (HCC) 11/13/2017   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   Allergic rhinitis 06/19/2017   Edema 12/24/2015   Carpal tunnel syndrome, right 12/16/2015   Hypercalcemia 11/11/2015   Pain in both upper extremities 11/11/2015   Drug reaction 10/28/2015   Muscle spasms of both lower extremities 10/28/2015   Nerve pain 10/28/2015   Chronic asthmatic bronchitis with acute exacerbation (HCC) 09/06/2015   Environmental and seasonal allergies 07/17/2014    PCP: Roslyn Coombe, MD  REFERRING PROVIDER: Ulysees Gander,  DO  REFERRING DIAG: Chronic left shoulder pain; Chronic pain of left knee; Chronic bilateral low back pain with left-sided sciatica  THERAPY DIAG:  Chronic left shoulder pain  Muscle weakness (generalized)  Rationale for Evaluation and Treatment: Rehabilitation  ONSET DATE: Chronic for a "couple of months"   SUBJECTIVE SUBJECTIVE STATEMENT: Patient reports she is feeling better today than last week.    Eval: Patient reports one day she woke up and her left shoulder was hurting and swollen. The  pain has pretty much stayed the same since it started. The left shoulder and arm feel heavy and achy. Sometimes she can move it around but sometimes it feels locked and painful when moving it. She did have an injection earlier this week and it is a little better, but still having some pain but not as bad as when she saw the doctor. She does report the shoulder will crack and pop when she moves it. Pain occurs with all movements overhead, out to the side, and behind back. Her left side of the neck also bothers her. She reports that her whole left arm occasionally goes numb and the middle finger is always numb. She reports difficulty sleeping because of the left shoulder.   Patient also reports neuropathy in both feet and legs, swelling in her legs, a lot of pain in her knees and hips, achilles tendinoplasty on the left, and L4-5 infusion with chronic lower back pain that comes and goes.   PERTINENT HISTORY: See PMH above  PAIN:  Are you having pain? Yes:  NPRS scale: 4/10 currently, 8/10 at worst Pain location: Left shoulder Pain description: Heavy, achy, sore Aggravating factors: Moving the left shoulder Relieving factors: Medication, topical cream and patches, ice  PRECAUTIONS: None  PATIENT GOALS: Pain relief and improve use of left arm   OBJECTIVE:  Note: Objective measures were completed at Evaluation unless otherwise noted. PATIENT SURVEYS:  Quick Dash 70.5%    SENSATION: Patient reports sensation deficit of left middle finger, otherwise all WFL but she does not occasional numbness of the left arm  POSTURE: Rounded shoulder posture  CERVICAL ROM:   Active ROM A/PROM (deg) eval   11/20/2023  Flexion 35   Extension 20   Right lateral flexion    Left lateral flexion    Right rotation 45 70  Left rotation 65 55   (Blank rows = not tested)  UPPER EXTREMITY ROM:   Active ROM Right eval Left eval Left 10/30/2023 Left 11/07/2023 Left 11/20/2023  Shoulder flexion 140 120  135 135 140  Shoulder extension       Shoulder abduction 160 120 140    Shoulder adduction       Shoulder internal rotation T8 L1     Shoulder external rotation 60 / T4 50 / C7     Elbow flexion       Elbow extension       Wrist flexion       Wrist extension       Wrist ulnar deviation       Wrist radial deviation       Wrist pronation       Wrist supination       (Blank rows = not tested)  UPPER EXTREMITY MMT:  MMT Right eval Left eval  Shoulder flexion 4+ 3+  Shoulder extension    Shoulder abduction 4+ 3+  Shoulder adduction    Shoulder internal rotation  4  Shoulder external rotation 4+ 4-  Middle  trapezius    Lower trapezius    Elbow flexion 5 4  Elbow extension 5 4  Wrist flexion    Wrist extension    Wrist ulnar deviation    Wrist radial deviation    Wrist pronation    Wrist supination    Grip strength (lbs) 49 25  (Blank rows = not tested)  SHOULDER SPECIAL TESTS: Not tested due to symptom irritability  JOINT MOBILITY TESTING:  Hypomobility of the left GHJ and with cervical CPAs  PALPATION:  Tenderness noted left upper trap, middle tap, rhomboid, infraspinatus, greater tubercle, bicep tendon                                                                                                                             TREATMENT  [facility] Adult PT Treatment:                                                DATE: 11/20/2023 UBE L1 x 4 min (fwd/bwd) to improve endurance and workload capacity Left GHJ mobs primarily distraction, inferior and posterior at various ranges of shoulder elevation Supine suboccipital release with gentle cervical traction Prone STM for left upper trap region Seated upper trap stretch 3 x 20 sec Seated shoulder blade squeezes 5 x 5 sec Standing wall slides for shoulder elevation 2 x 5 x 5 sec  Trigger Point Dry Needling  Subsequent Treatment: Instructions provided previously at initial dry needling treatment.  Instructions reviewed, if  requested by the patient, prior to subsequent dry needling treatment.   Patient Verbal Consent Given: Yes Education Handout Provided: Previously Provided Muscles Treated: Left upper trap and levator scap Electrical Stimulation Performed: No Treatment Response/Outcome: Twitch response   PATIENT EDUCATION: Education details: HEP Person educated: Patient Education method: Programmer, multimedia, Facilities manager, Actor cues, Verbal cues Education comprehension: verbalized understanding, returned demonstration, verbal cues required, tactile cues required, and needs further education  HOME EXERCISE PROGRAM: Access Code: [email or web address]   ASSESSMENT: CLINICAL IMPRESSION: Patient tolerated therapy well with no adverse effects. She does exhibit improvement in her cervical rotation and left shoulder elevation this visit but continues to report persistent pain and clicking of the left neck and shoulder region, as well as left hand numbness. She does not tolerate manual for the left shoulder well and guards with shoulder mobilizations. She does seem to respond well following the dry needling for the upper trap but continues to report tightness and tenderness that returns a few days after treatment. No changes made to her HEP this visit. Patient would benefit from continued skilled PT to progress mobility and strength in order to reduce pain and maximize functional ability.   Eval: Patient is a 68 y.o. female who was seen today for physical therapy evaluation and treatment for chronic left shoulder and neck pain with left arm and hand  numbness. She did exhibit high irritability level of left shoulder symptoms with limitations in shoulder A/PROM and strength due to pain that are impacting her functional ability. Her left shoulder symptoms seem multifactorial but most consistent with rotator cuff tendinopathy and likely left cervical radiculopathy. She also exhibits tenderness of left cervical and shoulder region and  performed TPDN this visit for left upper trap with twitch response. She was previously provided HEP for left shoulder but also provided her with neck stretching and postural exercise this visit.  OBJECTIVE IMPAIRMENTS: decreased activity tolerance, decreased ROM, decreased strength, postural dysfunction, and pain.   ACTIVITY LIMITATIONS: carrying, lifting, sleeping, bathing, dressing, reach over head, and hygiene/grooming  PARTICIPATION LIMITATIONS: meal prep, cleaning, laundry, shopping, and community activity  PERSONAL FACTORS: Fitness, Past/current experiences, and Time since onset of injury/illness/exacerbation are also affecting patient's functional outcome.    GOALS: Goals reviewed with patient? Yes  SHORT TERM GOALS: Target date: 11/15/2023  Patient will be I with initial HEP in order to progress with therapy. Baseline: HEP provided at eval 11/20/2023: independent with initial HEP Goal status: MET  2.  Patient will report left shoulder pain <= 6/10 in when using left arm order to reduce functional limitations Baseline: 8/10 11/20/2023: pain still 8/10 at worst Goal status: ONGOING  3.  Patient will demonstrate left cervical rotation >/= 60 deg in order to reduce neck tension and pain Baseline: 45 deg 11/20/2023: 55 deg Goal status: ONGOING  LONG TERM GOALS: Target date: 12/13/2023  Patient will be I with final HEP to maintain progress from PT. Baseline: HEP provided at eval Goal status: INITIAL  2.  Patient will report QuickDASH </= 45% in order to indicate an improvement in their functional status Baseline: 70.5% Goal status: INITIAL  3.  Patient will demonstrate improved range of motion of shoulder elevation >/= 140 deg in order to improve ability to perform dressing and grooming tasks Baseline: 120 deg Goal status: INITIAL  4.  Patient will demonstrate left shoulder strength >/= 4/5 MMT in order to improve activity tolerance and use of left arm with household  tasks Baseline: see limitations above Goal status: INITIAL   PLAN: PT FREQUENCY: 1-2x/week  PT DURATION: 8 weeks  PLANNED INTERVENTIONS: 97164- PT Re-evaluation, 97110-Therapeutic exercises, 97530- Therapeutic activity, 97112- Neuromuscular re-education, 97535- Self Care, 16109- Manual therapy, 97116- Gait training, Patient/Family education, Taping, Dry Needling, Joint mobilization, Joint manipulation, Spinal manipulation, Spinal mobilization, Cryotherapy, and Moist heat  PLAN FOR NEXT SESSION: Review HEP and progress PRN, manual/TPDN for left neck and shoulder region, left shoulder mobs and PROM, progress left shoulder AAROM->AROM and strengthening as tolerated, postural control; further assessment of cervical and neurodynamic testing   Leah Primus, PT, DPT, LAT, ATC 11/20/23  12:08 PM Phone: [phone] Fax: [phone]

## 2023-11-23 ENCOUNTER — Other Ambulatory Visit: Payer: Self-pay

## 2023-11-23 ENCOUNTER — Encounter: Payer: Self-pay | Admitting: Physical Therapy

## 2023-11-23 ENCOUNTER — Ambulatory Visit (INDEPENDENT_AMBULATORY_CARE_PROVIDER_SITE_OTHER): Admitting: Physical Therapy

## 2023-11-23 DIAGNOSIS — M25512 Pain in left shoulder: Secondary | ICD-10-CM

## 2023-11-23 DIAGNOSIS — G8929 Other chronic pain: Secondary | ICD-10-CM | POA: Diagnosis not present

## 2023-11-23 DIAGNOSIS — M6281 Muscle weakness (generalized): Secondary | ICD-10-CM

## 2023-11-23 NOTE — Therapy (Signed)
 OUTPATIENT PHYSICAL THERAPY TREATMENT  Progress Note Reporting Period 10/18/2023 to 11/23/2023  See note below for Objective Data and Assessment of Progress/Goals.     Patient Name: Frances Nelson MRN: [id number] DOB:1955-10-14, 68 y.o., female Today's Date: 11/23/2023   END OF SESSION:  PT End of Session - 11/23/23 1125     Visit Number 10    Number of Visits 17    Date for PT Re-Evaluation 12/13/23    Authorization Type MCR    Progress Note Due on Visit 20    PT Start Time 1107    PT Stop Time 1145    PT Time Calculation (min) 38 min    Activity Tolerance Patient tolerated treatment well    Behavior During Therapy WFL for tasks assessed/performed                      Past Medical History:  Diagnosis Date   Arthritis    Asthma    COPD (chronic obstructive pulmonary disease) (HCC)    GERD (gastroesophageal reflux disease)    Hypertension    Pneumonia    Past Surgical History:  Procedure Laterality Date   ABDOMINAL HYSTERECTOMY     APPENDECTOMY     HERNIA REPAIR     Umbilical x 3   KNEE ARTHROSCOPY W/ MENISCAL REPAIR Left 12/2022   TONSILLECTOMY     TRANSFORAMINAL LUMBAR INTERBODY FUSION (TLIF) WITH PEDICLE SCREW FIXATION 1 LEVEL N/A 05/18/2022   Procedure: Lumbar Four-Five Open Laminectomy/Transforaminal Lumbar Interbody Fusion/Posterolateral fusion;  Surgeon: Cannon Champion, MD;  Location: [facility] OR;  Service: Neurosurgery;  Laterality: N/A;   Patient Active Problem List   Diagnosis Date Noted   Obesity 10/13/2023   Shingles outbreak 05/29/2023   Microhematuria 04/09/2023   Bilateral shoulder pain 04/09/2023   Urethritis 10/05/2022   External otitis of left ear 10/05/2022   Spondylolisthesis of lumbar region 05/18/2022   Smoker 04/08/2022   Deviated nasal septum 04/08/2022   Bilateral pain of leg and foot 04/08/2022   Follicular acne 04/06/2022   Baker cyst, left 04/06/2022   Spinal stenosis of lumbar region with neurogenic claudication  10/28/2021   Depression 10/04/2021   Left knee pain 10/04/2021   Chronic pain 04/09/2021   Pelvic pain 04/09/2021   HLD (hyperlipidemia) 04/09/2021   Vitamin D  deficiency 04/09/2021   Estrogen deficiency 04/09/2021   Hyperglycemia 04/01/2021   Chronic cough 12/30/2020   GERD (gastroesophageal reflux disease) 11/06/2019   Bilateral leg edema 10/16/2019   Pleuritic chest pain 10/16/2019   Hypertension 10/02/2019   Arthralgia 06/28/2018   Sinusitis 06/28/2018   Renal cyst 05/03/2018   Syncope 05/03/2018   Stress and adjustment reaction 05/03/2018   History of tobacco use 05/02/2018   Primary osteoarthritis of both hands 04/10/2018   Primary osteoarthritis of both knees 04/10/2018   Primary osteoarthritis of both feet 04/10/2018   Achilles tendinitis 04/02/2018   Screening for breast cancer 03/15/2018   Positive ANA (antinuclear antibody) 03/15/2018   Screening for colon cancer 03/15/2018   Rash 03/15/2018   Achilles tendon pain 11/13/2017   Cervical myelopathy with cervical radiculopathy (HCC) 11/13/2017   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   Allergic rhinitis 06/19/2017   Edema 12/24/2015   Carpal tunnel syndrome, right 12/16/2015   Hypercalcemia 11/11/2015   Pain in both upper extremities 11/11/2015   Drug reaction 10/28/2015   Muscle spasms of both lower extremities 10/28/2015   Nerve pain 10/28/2015   Chronic asthmatic bronchitis with acute exacerbation (  HCC) 09/06/2015   Environmental and seasonal allergies 07/17/2014    PCP: Roslyn Coombe, MD  REFERRING PROVIDER: Ulysees Gander, DO  REFERRING DIAG: Chronic left shoulder pain; Chronic pain of left knee; Chronic bilateral low back pain with left-sided sciatica  THERAPY DIAG:  Chronic left shoulder pain  Muscle weakness (generalized)  Rationale for Evaluation and Treatment: Rehabilitation  ONSET DATE: Chronic for a "couple of months"   SUBJECTIVE SUBJECTIVE STATEMENT: Patient reports she is doing  alright. She is still having the clicking and pain in the left shoulder, and tightness with the left neck and shoulder.  Eval: Patient reports one day she woke up and her left shoulder was hurting and swollen. The pain has pretty much stayed the same since it started. The left shoulder and arm feel heavy and achy. Sometimes she can move it around but sometimes it feels locked and painful when moving it. She did have an injection earlier this week and it is a little better, but still having some pain but not as bad as when she saw the doctor. She does report the shoulder will crack and pop when she moves it. Pain occurs with all movements overhead, out to the side, and behind back. Her left side of the neck also bothers her. She reports that her whole left arm occasionally goes numb and the middle finger is always numb. She reports difficulty sleeping because of the left shoulder.   Patient also reports neuropathy in both feet and legs, swelling in her legs, a lot of pain in her knees and hips, achilles tendinoplasty on the left, and L4-5 infusion with chronic lower back pain that comes and goes.   PERTINENT HISTORY: See PMH above  PAIN:  Are you having pain? Yes:  NPRS scale: 4/10 currently, 8/10 at worst Pain location: Left shoulder Pain description: Heavy, achy, sore Aggravating factors: Moving the left shoulder Relieving factors: Medication, topical cream and patches, ice  PRECAUTIONS: None  PATIENT GOALS: Pain relief and improve use of left arm   OBJECTIVE:  Note: Objective measures were completed at Evaluation unless otherwise noted. PATIENT SURVEYS:  Quick Dash 70.5%    SENSATION: Patient reports sensation deficit of left middle finger, otherwise all WFL but she does not occasional numbness of the left arm  POSTURE: Rounded shoulder posture  CERVICAL ROM:   Active ROM A/PROM (deg) eval   11/20/2023  Flexion 35   Extension 20   Right lateral flexion    Left lateral  flexion    Right rotation 45 70  Left rotation 65 55   (Blank rows = not tested)  UPPER EXTREMITY ROM:   Active ROM Right eval Left eval Left 10/30/2023 Left 11/07/2023 Left 11/20/2023  Shoulder flexion 140 120 135 135 140  Shoulder extension       Shoulder abduction 160 120 140    Shoulder adduction       Shoulder internal rotation T8 L1     Shoulder external rotation 60 / T4 50 / C7     Elbow flexion       Elbow extension       Wrist flexion       Wrist extension       Wrist ulnar deviation       Wrist radial deviation       Wrist pronation       Wrist supination       (Blank rows = not tested)  UPPER EXTREMITY MMT:  MMT Right eval  Left eval  Shoulder flexion 4+ 3+  Shoulder extension    Shoulder abduction 4+ 3+  Shoulder adduction    Shoulder internal rotation  4  Shoulder external rotation 4+ 4-  Middle trapezius    Lower trapezius    Elbow flexion 5 4  Elbow extension 5 4  Wrist flexion    Wrist extension    Wrist ulnar deviation    Wrist radial deviation    Wrist pronation    Wrist supination    Grip strength (lbs) 49 25  (Blank rows = not tested)  SHOULDER SPECIAL TESTS: Not tested due to symptom irritability  JOINT MOBILITY TESTING:  Hypomobility of the left GHJ and with cervical CPAs  PALPATION:  Tenderness noted left upper trap, middle tap, rhomboid, infraspinatus, greater tubercle, bicep tendon                                                                                                                             TREATMENT  [facility] Adult PT Treatment:                                                DATE: 11/23/2023 Electrical Stimulation Location: Left cervical and shoulder region Action: IFC Parameters: standard pre-set settings x 15 min Goals: Pain relief and Reduced muscle tension UBE L1 x 4 min (fwd/bwd) to improve endurance and workload capacity Standing wall slides for shoulder elevation 2 x 10 x 5 sec Row with green 2 x 20 Extension  with yellow 2 x 10  PATIENT EDUCATION: Education details: HEP Person educated: Patient Education method: Programmer, multimedia, Demonstration, Actor cues, Verbal cues Education comprehension: verbalized understanding, returned demonstration, verbal cues required, tactile cues required, and needs further education  HOME EXERCISE PROGRAM: Access Code: [email or web address]   ASSESSMENT: CLINICAL IMPRESSION: Patient tolerated therapy well with no adverse effects. She continues to report persistent pain of the left neck and shoulder region so trial of e-stim IFC this visit with cold pack with good therapeutic benefit. She was able to complete exercises focused on improving shoulder mobility and overhead reach, and progress with her banded periscapular and postural strengthening. No changes made to her HEP this visit. Patient would benefit from continued skilled PT to progress mobility and strength in order to reduce pain and maximize functional ability.   Eval: Patient is a 68 y.o. female who was seen today for physical therapy evaluation and treatment for chronic left shoulder and neck pain with left arm and hand numbness. She did exhibit high irritability level of left shoulder symptoms with limitations in shoulder A/PROM and strength due to pain that are impacting her functional ability. Her left shoulder symptoms seem multifactorial but most consistent with rotator cuff tendinopathy and likely left cervical radiculopathy. She also exhibits tenderness of left cervical and shoulder region and performed TPDN this  visit for left upper trap with twitch response. She was previously provided HEP for left shoulder but also provided her with neck stretching and postural exercise this visit.  OBJECTIVE IMPAIRMENTS: decreased activity tolerance, decreased ROM, decreased strength, postural dysfunction, and pain.   ACTIVITY LIMITATIONS: carrying, lifting, sleeping, bathing, dressing, reach over head, and  hygiene/grooming  PARTICIPATION LIMITATIONS: meal prep, cleaning, laundry, shopping, and community activity  PERSONAL FACTORS: Fitness, Past/current experiences, and Time since onset of injury/illness/exacerbation are also affecting patient's functional outcome.    GOALS: Goals reviewed with patient? Yes  SHORT TERM GOALS: Target date: 11/15/2023  Patient will be I with initial HEP in order to progress with therapy. Baseline: HEP provided at eval 11/20/2023: independent with initial HEP Goal status: MET  2.  Patient will report left shoulder pain <= 6/10 in when using left arm order to reduce functional limitations Baseline: 8/10 11/20/2023: pain still 8/10 at worst Goal status: ONGOING  3.  Patient will demonstrate left cervical rotation >/= 60 deg in order to reduce neck tension and pain Baseline: 45 deg 11/20/2023: 55 deg Goal status: ONGOING  LONG TERM GOALS: Target date: 12/13/2023  Patient will be I with final HEP to maintain progress from PT. Baseline: HEP provided at eval Goal status: INITIAL  2.  Patient will report QuickDASH </= 45% in order to indicate an improvement in their functional status Baseline: 70.5% Goal status: INITIAL  3.  Patient will demonstrate improved range of motion of shoulder elevation >/= 140 deg in order to improve ability to perform dressing and grooming tasks Baseline: 120 deg Goal status: INITIAL  4.  Patient will demonstrate left shoulder strength >/= 4/5 MMT in order to improve activity tolerance and use of left arm with household tasks Baseline: see limitations above Goal status: INITIAL   PLAN: PT FREQUENCY: 1-2x/week  PT DURATION: 8 weeks  PLANNED INTERVENTIONS: 97164- PT Re-evaluation, 97110-Therapeutic exercises, 97530- Therapeutic activity, 97112- Neuromuscular re-education, 97535- Self Care, [id number]- Manual therapy, 97116- Gait training, Patient/Family education, Taping, Dry Needling, Joint mobilization, Joint manipulation, Spinal  manipulation, Spinal mobilization, Cryotherapy, and Moist heat  PLAN FOR NEXT SESSION: Review HEP and progress PRN, manual/TPDN for left neck and shoulder region, left shoulder mobs and PROM, progress left shoulder AAROM->AROM and strengthening as tolerated, postural control; further assessment of cervical and neurodynamic testing   Leah Primus, PT, DPT, LAT, ATC 11/23/23  11:52 AM Phone: [phone] Fax: [phone]

## 2023-11-26 ENCOUNTER — Encounter: Admitting: Physical Therapy

## 2023-11-27 ENCOUNTER — Ambulatory Visit: Payer: Medicare Other | Admitting: Dermatology

## 2023-11-29 ENCOUNTER — Ambulatory Visit (INDEPENDENT_AMBULATORY_CARE_PROVIDER_SITE_OTHER): Admitting: Physical Therapy

## 2023-11-29 ENCOUNTER — Encounter: Payer: Self-pay | Admitting: Physical Therapy

## 2023-11-29 ENCOUNTER — Other Ambulatory Visit: Payer: Self-pay

## 2023-11-29 DIAGNOSIS — M25512 Pain in left shoulder: Secondary | ICD-10-CM | POA: Diagnosis not present

## 2023-11-29 DIAGNOSIS — M6281 Muscle weakness (generalized): Secondary | ICD-10-CM

## 2023-11-29 DIAGNOSIS — G8929 Other chronic pain: Secondary | ICD-10-CM | POA: Diagnosis not present

## 2023-11-29 NOTE — Therapy (Signed)
 OUTPATIENT PHYSICAL THERAPY TREATMENT    Patient Name: Frances Nelson MRN: [id number] DOB:Jan 26, 1956, 68 y.o., female Today's Date: 11/29/2023   END OF SESSION:  PT End of Session - 11/29/23 1133     Visit Number 11    Number of Visits 17    Date for PT Re-Evaluation 12/13/23    Authorization Type MCR    Progress Note Due on Visit 20    PT Start Time 1109    PT Stop Time 1147    PT Time Calculation (min) 38 min    Activity Tolerance Patient tolerated treatment well    Behavior During Therapy WFL for tasks assessed/performed                       Past Medical History:  Diagnosis Date   Arthritis    Asthma    COPD (chronic obstructive pulmonary disease) (HCC)    GERD (gastroesophageal reflux disease)    Hypertension    Pneumonia    Past Surgical History:  Procedure Laterality Date   ABDOMINAL HYSTERECTOMY     APPENDECTOMY     HERNIA REPAIR     Umbilical x 3   KNEE ARTHROSCOPY W/ MENISCAL REPAIR Left 12/2022   TONSILLECTOMY     TRANSFORAMINAL LUMBAR INTERBODY FUSION (TLIF) WITH PEDICLE SCREW FIXATION 1 LEVEL N/A 05/18/2022   Procedure: Lumbar Four-Five Open Laminectomy/Transforaminal Lumbar Interbody Fusion/Posterolateral fusion;  Surgeon: Cannon Champion, MD;  Location: [facility] OR;  Service: Neurosurgery;  Laterality: N/A;   Patient Active Problem List   Diagnosis Date Noted   Obesity 10/13/2023   Shingles outbreak 05/29/2023   Microhematuria 04/09/2023   Bilateral shoulder pain 04/09/2023   Urethritis 10/05/2022   External otitis of left ear 10/05/2022   Spondylolisthesis of lumbar region 05/18/2022   Smoker 04/08/2022   Deviated nasal septum 04/08/2022   Bilateral pain of leg and foot 04/08/2022   Follicular acne 04/06/2022   Baker cyst, left 04/06/2022   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   Depression 10/04/2021   Left knee pain 10/04/2021   Chronic pain 04/09/2021   Pelvic pain 04/09/2021   HLD  (hyperlipidemia) 04/09/2021   Vitamin D  deficiency 04/09/2021   Estrogen deficiency 04/09/2021   Hyperglycemia 04/01/2021   Chronic cough 12/30/2020   GERD (gastroesophageal reflux disease) 11/06/2019   Bilateral leg edema 10/16/2019   Pleuritic chest pain 10/16/2019   Hypertension 10/02/2019   Arthralgia 06/28/2018   Sinusitis 06/28/2018   Renal cyst 05/03/2018   Syncope 05/03/2018   Stress and adjustment reaction 05/03/2018   History of tobacco use 05/02/2018   Primary osteoarthritis of both hands 04/10/2018   Primary osteoarthritis of both knees 04/10/2018   Primary osteoarthritis of both feet 04/10/2018   Achilles tendinitis 04/02/2018   Screening for breast cancer 03/15/2018   Positive ANA (antinuclear antibody) 03/15/2018   Screening for colon cancer 03/15/2018   Rash 03/15/2018   Achilles tendon pain 11/13/2017   Cervical myelopathy with cervical radiculopathy (HCC) 11/13/2017   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   Allergic rhinitis 06/19/2017   Edema 12/24/2015   Carpal tunnel syndrome, right 12/16/2015   Hypercalcemia 11/11/2015   Pain in both upper extremities 11/11/2015   Drug reaction 10/28/2015   Muscle spasms of both lower extremities 10/28/2015   Nerve pain 10/28/2015   Chronic asthmatic bronchitis with acute exacerbation (HCC) 09/06/2015   Environmental and seasonal allergies 07/17/2014    PCP: Roslyn Coombe, MD  REFERRING  PROVIDER: Ulysees Gander, DO  REFERRING DIAG: Chronic left shoulder pain; Chronic pain of left knee; Chronic bilateral low back pain with left-sided sciatica  THERAPY DIAG:  Chronic left shoulder pain  Muscle weakness (generalized)  Rationale for Evaluation and Treatment: Rehabilitation  ONSET DATE: Chronic for a "couple of months"   SUBJECTIVE SUBJECTIVE STATEMENT: Patient reports she has been having lots of pain and not sleeping more from generalized body pain and not specifically from the shoulder. She is using a  magnesium oil spray on the shoulder which seems to be helpful for the pain, but continues to have the painful clicking whenever she raises the arm.   Eval: Patient reports one day she woke up and her left shoulder was hurting and swollen. The pain has pretty much stayed the same since it started. The left shoulder and arm feel heavy and achy. Sometimes she can move it around but sometimes it feels locked and painful when moving it. She did have an injection earlier this week and it is a little better, but still having some pain but not as bad as when she saw the doctor. She does report the shoulder will crack and pop when she moves it. Pain occurs with all movements overhead, out to the side, and behind back. Her left side of the neck also bothers her. She reports that her whole left arm occasionally goes numb and the middle finger is always numb. She reports difficulty sleeping because of the left shoulder.   Patient also reports neuropathy in both feet and legs, swelling in her legs, a lot of pain in her knees and hips, achilles tendinoplasty on the left, and L4-5 infusion with chronic lower back pain that comes and goes.   PERTINENT HISTORY: See PMH above  PAIN:  Are you having pain? Yes:  NPRS scale: 3.5/10 currently, 8/10 at worst Pain location: Left shoulder Pain description: Heavy, achy, sore Aggravating factors: Moving the left shoulder Relieving factors: Medication, topical cream and patches, ice  PRECAUTIONS: None  PATIENT GOALS: Pain relief and improve use of left arm   OBJECTIVE:  Note: Objective measures were completed at Evaluation unless otherwise noted. PATIENT SURVEYS:  Quick Dash 70.5%    SENSATION: Patient reports sensation deficit of left middle finger, otherwise all WFL but she does not occasional numbness of the left arm  POSTURE: Rounded shoulder posture  CERVICAL ROM:   Active ROM A/PROM (deg) eval   11/20/2023  Flexion 35   Extension 20   Right lateral  flexion    Left lateral flexion    Right rotation 45 70  Left rotation 65 55   (Blank rows = not tested)  UPPER EXTREMITY ROM:   Active ROM Right eval Left eval Left 10/30/2023 Left 11/07/2023 Left 11/20/2023  Shoulder flexion 140 120 135 135 140  Shoulder extension       Shoulder abduction 160 120 140    Shoulder adduction       Shoulder internal rotation T8 L1     Shoulder external rotation 60 / T4 50 / C7     Elbow flexion       Elbow extension       Wrist flexion       Wrist extension       Wrist ulnar deviation       Wrist radial deviation       Wrist pronation       Wrist supination       (Blank rows = not  tested)  UPPER EXTREMITY MMT:  MMT Right eval Left eval  Shoulder flexion 4+ 3+  Shoulder extension    Shoulder abduction 4+ 3+  Shoulder adduction    Shoulder internal rotation  4  Shoulder external rotation 4+ 4-  Middle trapezius    Lower trapezius    Elbow flexion 5 4  Elbow extension 5 4  Wrist flexion    Wrist extension    Wrist ulnar deviation    Wrist radial deviation    Wrist pronation    Wrist supination    Grip strength (lbs) 49 25  (Blank rows = not tested)  SHOULDER SPECIAL TESTS: Not tested due to symptom irritability  JOINT MOBILITY TESTING:  Hypomobility of the left GHJ and with cervical CPAs  PALPATION:  Tenderness noted left upper trap, middle tap, rhomboid, infraspinatus, greater tubercle, bicep tendon                                                                                                                             TREATMENT  [facility] Adult PT Treatment:                                                DATE: 11/29/2023 UBE L1 x 4 min (fwd/bwd) to improve endurance and workload capacity Standing wall slides for shoulder elevation 2 x 10 x 5 sec Shoulder isometrics at wall for flexion, abduction, extension, IR, ER 2 x 5 sec each Row with green 2 x 15 Extension with yellow 2 x 10 Supine shoulder flexion short arcs 80-100 3 x  10  PATIENT EDUCATION: Education details: HEP update Person educated: Patient Education method: Explanation, Demonstration, Tactile cues, Verbal cues, Handout Education comprehension: verbalized understanding, returned demonstration, verbal cues required, tactile cues required, and needs further education  HOME EXERCISE PROGRAM: Access Code: [email or web address]   ASSESSMENT: CLINICAL IMPRESSION: Patient tolerated therapy well with no adverse effects. She arrives reporting improved but continued pain of the left neck and shoulder with hand numbness. She continues to experience a painful clicking whenever she raises her arm. Therapy focused on continued strengthening and mobility for the left shoulder. Incorporated more shoulder isometric exercises with patient reporting most pain with shoulder ER. Performed shoulder flexion through small range with patient having most pain and difficulty just raising her arm to 90 deg but less difficulty with the short arc movement. She does report an increase in shoulder pain with all exercises and following therapy. Updated her HEP to remove exercises that frequently elicit the painful clicking in the shoulder and include shoulder isometrics. Patient would benefit from continued skilled PT to progress mobility and strength in order to reduce pain and maximize functional ability.   Eval: Patient is a 68 y.o. female who was seen today for physical therapy evaluation and treatment for chronic left shoulder and  neck pain with left arm and hand numbness. She did exhibit high irritability level of left shoulder symptoms with limitations in shoulder A/PROM and strength due to pain that are impacting her functional ability. Her left shoulder symptoms seem multifactorial but most consistent with rotator cuff tendinopathy and likely left cervical radiculopathy. She also exhibits tenderness of left cervical and shoulder region and performed TPDN this visit for left upper trap with  twitch response. She was previously provided HEP for left shoulder but also provided her with neck stretching and postural exercise this visit.  OBJECTIVE IMPAIRMENTS: decreased activity tolerance, decreased ROM, decreased strength, postural dysfunction, and pain.   ACTIVITY LIMITATIONS: carrying, lifting, sleeping, bathing, dressing, reach over head, and hygiene/grooming  PARTICIPATION LIMITATIONS: meal prep, cleaning, laundry, shopping, and community activity  PERSONAL FACTORS: Fitness, Past/current experiences, and Time since onset of injury/illness/exacerbation are also affecting patient's functional outcome.    GOALS: Goals reviewed with patient? Yes  SHORT TERM GOALS: Target date: 11/15/2023  Patient will be I with initial HEP in order to progress with therapy. Baseline: HEP provided at eval 11/20/2023: independent with initial HEP Goal status: MET  2.  Patient will report left shoulder pain <= 6/10 in when using left arm order to reduce functional limitations Baseline: 8/10 11/20/2023: pain still 8/10 at worst Goal status: ONGOING  3.  Patient will demonstrate left cervical rotation >/= 60 deg in order to reduce neck tension and pain Baseline: 45 deg 11/20/2023: 55 deg Goal status: ONGOING  LONG TERM GOALS: Target date: 12/13/2023  Patient will be I with final HEP to maintain progress from PT. Baseline: HEP provided at eval Goal status: INITIAL  2.  Patient will report QuickDASH </= 45% in order to indicate an improvement in their functional status Baseline: 70.5% Goal status: INITIAL  3.  Patient will demonstrate improved range of motion of shoulder elevation >/= 140 deg in order to improve ability to perform dressing and grooming tasks Baseline: 120 deg Goal status: INITIAL  4.  Patient will demonstrate left shoulder strength >/= 4/5 MMT in order to improve activity tolerance and use of left arm with household tasks Baseline: see limitations above Goal status:  INITIAL   PLAN: PT FREQUENCY: 1-2x/week  PT DURATION: 8 weeks  PLANNED INTERVENTIONS: 97164- PT Re-evaluation, 97110-Therapeutic exercises, 97530- Therapeutic activity, 97112- Neuromuscular re-education, 97535- Self Care, 16109- Manual therapy, 97116- Gait training, Patient/Family education, Taping, Dry Needling, Joint mobilization, Joint manipulation, Spinal manipulation, Spinal mobilization, Cryotherapy, and Moist heat  PLAN FOR NEXT SESSION: Review HEP and progress PRN, manual/TPDN for left neck and shoulder region, left shoulder mobs and PROM, progress left shoulder AAROM->AROM and strengthening as tolerated, postural control; further assessment of cervical and neurodynamic testing   Leah Primus, PT, DPT, LAT, ATC 11/29/23  12:09 PM Phone: [phone] Fax: [phone]

## 2023-11-29 NOTE — Patient Instructions (Signed)
 Access Code: [email or web address] URL: [email or web address] Date: 11/29/2023 Prepared by: Leah Primus  Exercises - Gentle Upper Trap Stretch  - 2-3 x daily - 3 reps - 15 secnds hold - Standing shoulder flexion wall slides  - 2 x daily - 2 sets - 10 reps - Standing Row with Anchored Resistance  - 1 x daily - 3 sets - 10 reps - Standing Isometric Shoulder Flexion with Doorway - Arm Bent  - 1 x daily - 3 sets - 10 reps - Standing Isometric Shoulder Abduction with Doorway - Arm Bent  - 1 x daily - 3 sets - 5 reps - 5seconds hold - Standing Isometric Shoulder Extension with Doorway - Arm Bent  - 1 x daily - 3 sets - 5 reps - 5 seconds hold - Standing Isometric Shoulder Internal Rotation at Doorway  - 1 x daily - 3 sets - 5 reps - 5 seconds hold - Standing Isometric Shoulder External Rotation with Doorway  - 1 x daily - 3 sets - 5 reps - 5 seconds hold

## 2023-12-04 ENCOUNTER — Other Ambulatory Visit: Payer: Self-pay

## 2023-12-04 ENCOUNTER — Encounter: Payer: Self-pay | Admitting: Physical Therapy

## 2023-12-04 ENCOUNTER — Ambulatory Visit (INDEPENDENT_AMBULATORY_CARE_PROVIDER_SITE_OTHER): Admitting: Physical Therapy

## 2023-12-04 DIAGNOSIS — G8929 Other chronic pain: Secondary | ICD-10-CM | POA: Diagnosis not present

## 2023-12-04 DIAGNOSIS — M25512 Pain in left shoulder: Secondary | ICD-10-CM

## 2023-12-04 DIAGNOSIS — M6281 Muscle weakness (generalized): Secondary | ICD-10-CM | POA: Diagnosis not present

## 2023-12-04 NOTE — Therapy (Signed)
 OUTPATIENT PHYSICAL THERAPY TREATMENT    Patient Name: Frances Nelson MRN: [id number] DOB:17-May-1956, 68 y.o., female Today's Date: 12/04/2023   END OF SESSION:  PT End of Session - 12/04/23 1109     Visit Number 12    Number of Visits 17    Date for PT Re-Evaluation 12/13/23    Authorization Type MCR    Progress Note Due on Visit 20    PT Start Time 1105    PT Stop Time 1147    PT Time Calculation (min) 42 min    Activity Tolerance Patient tolerated treatment well    Behavior During Therapy WFL for tasks assessed/performed                        Past Medical History:  Diagnosis Date   Arthritis    Asthma    COPD (chronic obstructive pulmonary disease) (HCC)    GERD (gastroesophageal reflux disease)    Hypertension    Pneumonia    Past Surgical History:  Procedure Laterality Date   ABDOMINAL HYSTERECTOMY     APPENDECTOMY     HERNIA REPAIR     Umbilical x 3   KNEE ARTHROSCOPY W/ MENISCAL REPAIR Left 12/2022   TONSILLECTOMY     TRANSFORAMINAL LUMBAR INTERBODY FUSION (TLIF) WITH PEDICLE SCREW FIXATION 1 LEVEL N/A 05/18/2022   Procedure: Lumbar Four-Five Open Laminectomy/Transforaminal Lumbar Interbody Fusion/Posterolateral fusion;  Surgeon: Cannon Champion, MD;  Location: [facility] OR;  Service: Neurosurgery;  Laterality: N/A;   Patient Active Problem List   Diagnosis Date Noted   Obesity 10/13/2023   Shingles outbreak 05/29/2023   Microhematuria 04/09/2023   Bilateral shoulder pain 04/09/2023   Urethritis 10/05/2022   External otitis of left ear 10/05/2022   Spondylolisthesis of lumbar region 05/18/2022   Smoker 04/08/2022   Deviated nasal septum 04/08/2022   Bilateral pain of leg and foot 04/08/2022   Follicular acne 04/06/2022   Baker cyst, left 04/06/2022   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   Depression 10/04/2021   Left knee pain 10/04/2021   Chronic pain 04/09/2021   Pelvic pain 04/09/2021   HLD  (hyperlipidemia) 04/09/2021   Vitamin D  deficiency 04/09/2021   Estrogen deficiency 04/09/2021   Hyperglycemia 04/01/2021   Chronic cough 12/30/2020   GERD (gastroesophageal reflux disease) 11/06/2019   Bilateral leg edema 10/16/2019   Pleuritic chest pain 10/16/2019   Hypertension 10/02/2019   Arthralgia 06/28/2018   Sinusitis 06/28/2018   Renal cyst 05/03/2018   Syncope 05/03/2018   Stress and adjustment reaction 05/03/2018   History of tobacco use 05/02/2018   Primary osteoarthritis of both hands 04/10/2018   Primary osteoarthritis of both knees 04/10/2018   Primary osteoarthritis of both feet 04/10/2018   Achilles tendinitis 04/02/2018   Screening for breast cancer 03/15/2018   Positive ANA (antinuclear antibody) 03/15/2018   Screening for colon cancer 03/15/2018   Rash 03/15/2018   Achilles tendon pain 11/13/2017   Cervical myelopathy with cervical radiculopathy (HCC) 11/13/2017   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   Allergic rhinitis 06/19/2017   Edema 12/24/2015   Carpal tunnel syndrome, right 12/16/2015   Hypercalcemia 11/11/2015   Pain in both upper extremities 11/11/2015   Drug reaction 10/28/2015   Muscle spasms of both lower extremities 10/28/2015   Nerve pain 10/28/2015   Chronic asthmatic bronchitis with acute exacerbation (HCC) 09/06/2015   Environmental and seasonal allergies 07/17/2014    PCP: Roslyn Coombe, MD  REFERRING PROVIDER: Ulysees Gander, DO  REFERRING DIAG: Chronic left shoulder pain; Chronic pain of left knee; Chronic bilateral low back pain with left-sided sciatica  THERAPY DIAG:  Chronic left shoulder pain  Muscle weakness (generalized)  Rationale for Evaluation and Treatment: Rehabilitation  ONSET DATE: Chronic for a "couple of months"   SUBJECTIVE SUBJECTIVE STATEMENT: Patient reports she is feeling "tolerable." She reports the shoulder isometrics are all good except for the shoulder ER movement. She does report a lot of  clicking in the left shoulder today.  Eval: Patient reports one day she woke up and her left shoulder was hurting and swollen. The pain has pretty much stayed the same since it started. The left shoulder and arm feel heavy and achy. Sometimes she can move it around but sometimes it feels locked and painful when moving it. She did have an injection earlier this week and it is a little better, but still having some pain but not as bad as when she saw the doctor. She does report the shoulder will crack and pop when she moves it. Pain occurs with all movements overhead, out to the side, and behind back. Her left side of the neck also bothers her. She reports that her whole left arm occasionally goes numb and the middle finger is always numb. She reports difficulty sleeping because of the left shoulder.   Patient also reports neuropathy in both feet and legs, swelling in her legs, a lot of pain in her knees and hips, achilles tendinoplasty on the left, and L4-5 infusion with chronic lower back pain that comes and goes.   PERTINENT HISTORY: See PMH above  PAIN:  Are you having pain? Yes:  NPRS scale: 4/10 currently, 8/10 at worst Pain location: Left shoulder Pain description: Heavy, achy, sore Aggravating factors: Moving the left shoulder Relieving factors: Medication, topical cream and patches, ice  PRECAUTIONS: None  PATIENT GOALS: Pain relief and improve use of left arm   OBJECTIVE:  Note: Objective measures were completed at Evaluation unless otherwise noted. PATIENT SURVEYS:  Quick Dash 70.5%    SENSATION: Patient reports sensation deficit of left middle finger, otherwise all WFL but she does not occasional numbness of the left arm  POSTURE: Rounded shoulder posture  CERVICAL ROM:   Active ROM A/PROM (deg) eval   11/20/2023   12/04/2023  Flexion 35    Extension 20    Right lateral flexion     Left lateral flexion     Right rotation 45 70 70  Left rotation 65 55 60   (Blank  rows = not tested)  UPPER EXTREMITY ROM:   Active ROM Right eval Left eval Left 10/30/2023 Left 11/07/2023 Left 11/20/2023 Left 12/04/2023  Shoulder flexion 140 120 135 135 140 140  Shoulder extension        Shoulder abduction 160 120 140     Shoulder adduction        Shoulder internal rotation T8 L1    T10  Shoulder external rotation 60 / T4 50 / C7      Elbow flexion        Elbow extension        Wrist flexion        Wrist extension        Wrist ulnar deviation        Wrist radial deviation        Wrist pronation        Wrist supination        (  Blank rows = not tested)  UPPER EXTREMITY MMT:  MMT Right eval Left eval Left 12/04/2023  Shoulder flexion 4+ 3+ 3+  Shoulder extension     Shoulder abduction 4+ 3+ 3+  Shoulder adduction     Shoulder internal rotation  4   Shoulder external rotation 4+ 4- 4-  Middle trapezius     Lower trapezius     Elbow flexion 5 4   Elbow extension 5 4   Wrist flexion     Wrist extension     Wrist ulnar deviation     Wrist radial deviation     Wrist pronation     Wrist supination     Grip strength (lbs) 49 25   (Blank rows = not tested)  SHOULDER SPECIAL TESTS: Not tested due to symptom irritability  JOINT MOBILITY TESTING:  Hypomobility of the left GHJ and with cervical CPAs  PALPATION:  Tenderness noted left upper trap, middle tap, rhomboid, infraspinatus, greater tubercle, bicep tendon                                                                                                                             TREATMENT  [facility] Adult PT Treatment:                                                DATE: 12/04/2023 UBE L1 x 4 min (fwd/bwd) to improve endurance and workload capacity Supine shoulder isometrics against manual resistance for flexion, abduction, extension, IR, ER 2 x 6 x 5 sec each Supine shoulder flexion short arcs 80-100 2 x 10 Standing wall slides for shoulder elevation 2 x 10 x 5 sec Row with green x 15 Extension  with yellow x 10  Trigger Point Dry Needling  Subsequent Treatment: Instructions provided previously at initial dry needling treatment.  Instructions reviewed, if requested by the patient, prior to subsequent dry needling treatment.   Patient Verbal Consent Given: Yes Education Handout Provided: Previously Provided Muscles Treated: Left upper trap, suboccipitals, infraspinatus Electrical Stimulation Performed: No Treatment Response/Outcome: Twitch response   PATIENT EDUCATION: Education details: HEP, TPDN Person educated: Patient Education method: Explanation, Demonstration, Tactile cues, Verbal cues, Handout Education comprehension: verbalized understanding, returned demonstration, verbal cues required, tactile cues required, and needs further education  HOME EXERCISE PROGRAM: Access Code: [email or web address]   ASSESSMENT: CLINICAL IMPRESSION: Patient tolerated therapy well with no adverse effects. She continues to report left shoulder and pain with active shoulder movement. Continued with TPDN this visit for the left cervical and shoulder region. Therapy continues to focus on left shoulder muscle activation/strengthening and active motion. She tolerated shoulder isometrics better with manual resistance this visit. She continues to exhibit left shoulder weakness and pain with movement, but has improved with her shoulder motion. No changes to her HEP this visit. Patient would benefit from  continued skilled PT to progress mobility and strength in order to reduce pain and maximize functional ability.   Eval: Patient is a 68 y.o. female who was seen today for physical therapy evaluation and treatment for chronic left shoulder and neck pain with left arm and hand numbness. She did exhibit high irritability level of left shoulder symptoms with limitations in shoulder A/PROM and strength due to pain that are impacting her functional ability. Her left shoulder symptoms seem multifactorial but most  consistent with rotator cuff tendinopathy and likely left cervical radiculopathy. She also exhibits tenderness of left cervical and shoulder region and performed TPDN this visit for left upper trap with twitch response. She was previously provided HEP for left shoulder but also provided her with neck stretching and postural exercise this visit.  OBJECTIVE IMPAIRMENTS: decreased activity tolerance, decreased ROM, decreased strength, postural dysfunction, and pain.   ACTIVITY LIMITATIONS: carrying, lifting, sleeping, bathing, dressing, reach over head, and hygiene/grooming  PARTICIPATION LIMITATIONS: meal prep, cleaning, laundry, shopping, and community activity  PERSONAL FACTORS: Fitness, Past/current experiences, and Time since onset of injury/illness/exacerbation are also affecting patient's functional outcome.    GOALS: Goals reviewed with patient? Yes  SHORT TERM GOALS: Target date: 11/15/2023  Patient will be I with initial HEP in order to progress with therapy. Baseline: HEP provided at eval 11/20/2023: independent with initial HEP Goal status: MET  2.  Patient will report left shoulder pain <= 6/10 in when using left arm order to reduce functional limitations Baseline: 8/10 11/20/2023: pain still 8/10 at worst Goal status: ONGOING  3.  Patient will demonstrate left cervical rotation >/= 60 deg in order to reduce neck tension and pain Baseline: 45 deg 11/20/2023: 55 deg Goal status: ONGOING  LONG TERM GOALS: Target date: 12/13/2023  Patient will be I with final HEP to maintain progress from PT. Baseline: HEP provided at eval Goal status: INITIAL  2.  Patient will report QuickDASH </= 45% in order to indicate an improvement in their functional status Baseline: 70.5% Goal status: INITIAL  3.  Patient will demonstrate improved range of motion of shoulder elevation >/= 140 deg in order to improve ability to perform dressing and grooming tasks Baseline: 120 deg Goal status:  INITIAL  4.  Patient will demonstrate left shoulder strength >/= 4/5 MMT in order to improve activity tolerance and use of left arm with household tasks Baseline: see limitations above Goal status: INITIAL   PLAN: PT FREQUENCY: 1-2x/week  PT DURATION: 8 weeks  PLANNED INTERVENTIONS: 97164- PT Re-evaluation, 97110-Therapeutic exercises, 97530- Therapeutic activity, 97112- Neuromuscular re-education, 97535- Self Care, [address]- Manual therapy, 97116- Gait training, Patient/Family education, Taping, Dry Needling, Joint mobilization, Joint manipulation, Spinal manipulation, Spinal mobilization, Cryotherapy, and Moist heat  PLAN FOR NEXT SESSION: Review HEP and progress PRN, manual/TPDN for left neck and shoulder region, left shoulder mobs and PROM, progress left shoulder AAROM->AROM and strengthening as tolerated, postural control; further assessment of cervical and neurodynamic testing   Leah Primus, PT, DPT, LAT, ATC 12/04/23  1:11 PM Phone: [phone] Fax: [phone]

## 2023-12-05 NOTE — Progress Notes (Addendum)
 Ben Izabelle Daus [name] [address] [place] [address] Phone: [phone]   Assessment and Plan:     1. Chronic left shoulder pain 2. Impingement syndrome of left shoulder 3. Neck pain 4. Cervical radiculopathy 5. Cervicalgia -Chronic with exacerbation, subsequent visit - Patient continues to experience left shoulder pain, neck pain, worsening radicular symptoms in the left upper extremity.  Patient has had no improvement despite conservative treatment course of subacromial CSI, Celebrex , physical therapy, HEP - At this time, patient's HPI and physical exam present a mixed picture.  Some findings are consistent with intra-articular left shoulder pathology such as labral pathology versus osteoarthritis flare.  Other findings are consistent with cervical radiculopathy.  Recommend further evaluation of C-spine and left shoulder with intra-articular contrast to differentiate if patient has a single condition causing pain, or multiple.  Patient has failed >6 weeks of conservative therapy, has no significant findings on prior x-ray imaging, pain >6/10, pain with day-to-day activities -Continue Tylenol  500 to 1000 mg tablets 2-3 times a day for day-to-day pain relief  -Continue HEP and physical therapy  6.  Claustrophobia - Ativan  0.5 mg 1 to 2 tablets 30 minutes prior to MRI  Pertinent previous records reviewed include none  Follow Up: 1 week after MRIs to review results and discuss treatment plan   Subjective:   I, Audie Bleacher, am serving as a Neurosurgeon for Doctor Ulysees Gander  Chief Complaint: left shoulder pain    HPI:    10/15/2023 Patient is a 68 year old female with left shoulder pain. Patient states has been having some dizziness.  Pain keeps her up at night. Try to sleep on back as much as possible. Wakes up lying on her left side. Worse when she gets up in the morning but the pain is consistent all day.   Duration?about a  month now Did you have an Injury to cause this pain? no Taking Medication for pain? Tylenol  arthritis, tramadol  Numbness or Tingling?yes second finger completely numb Does the pain Radiate? Yes (from neck down to fingers) Altered gait or use? yes ROM/ impairment of movement? All ranges are painful   11/09/2023 Patient state she is sore but just finished PT   12/06/2023 Patient states still in pain . Still getting popping and clicking    Relevant Historical Information: Hypertension, GERD, COPD, history of positive ANA, status post lumbar fusion Additional pertinent review of systems negative.   Current Outpatient Medications:    acetaminophen  (TYLENOL ) 650 MG CR tablet, Take 1,300 mg by mouth as needed., Disp: , Rfl:    acidophilus (RISAQUAD) CAPS capsule, Take 1 capsule by mouth in the morning., Disp: , Rfl:    Adapalene  (DIFFERIN ) 0.3 % gel, Apply 1 Application topically at bedtime. Apply a pea sized amount to face and behind ears 3 nights weekly, Disp: 45 g, Rfl: 2   alendronate  (FOSAMAX ) 70 MG tablet, Take 1 tablet (70 mg total) by mouth every 7 (seven) days. Take with a full glass of water on an empty stomach., Disp: 12 tablet, Rfl: 3   B Complex Vitamins (VITAMIN-B COMPLEX PO), Take by mouth daily., Disp: , Rfl:    Calcium -Vitamins C & D (CALCIUM /C/D PO), Take 1 tablet by mouth in the morning., Disp: , Rfl:    celecoxib  (CELEBREX ) 100 MG capsule, Take 1 capsule (100 mg total) by mouth 2 (two) times daily., Disp: 60 capsule, Rfl: 1   cetirizine (ZYRTEC) 10 MG tablet, Take 10 mg by  mouth in the morning., Disp: , Rfl:    Cholecalciferol (HM VITAMIN D3 PO), Take 5,000 Units by mouth in the morning., Disp: , Rfl:    clindamycin  (CLEOCIN  T) 1 % SWAB, APPLY 1 APPLICATION TOPICALLY IN THE MORNING. WIPE ON FACE AND BEHIND EARS EVERY MORNING, Disp: 60 each, Rfl: 2   clobetasol  cream (TEMOVATE ) 0.05 %, Apply 1 Application topically 2 (two) times daily. Apply to affected areas 2 x daily for 2  weeks then stop. Use as needed (Patient taking differently: Apply 1 Application topically as needed. Apply to affected areas 2 x daily for 2 weeks then stop. Use as needed), Disp: 45 g, Rfl: 0   COLLAGEN PO, Take by mouth. Collagen Peptides, Disp: , Rfl:    doxycycline  (VIBRAMYCIN ) 50 MG capsule, Take 1 capsule (50 mg total) by mouth daily. With food and plenty of fluid, Disp: 30 capsule, Rfl: 3   DULoxetine  (CYMBALTA ) 30 MG capsule, TAKE 1 TAB BY MOUTH IN THE AM AND 2 IN THE PM, Disp: 270 capsule, Rfl: 4   ezetimibe  (ZETIA ) 10 MG tablet, Take 1 tablet (10 mg total) by mouth daily., Disp: 90 tablet, Rfl: 3   gabapentin  (NEURONTIN ) 100 MG capsule, Take 1 capsule (100 mg total) by mouth 3 (three) times daily., Disp: 90 capsule, Rfl: 5   Homeopathic Products (ZICAM ALLERGY RELIEF NA), Place into the nose., Disp: , Rfl:    hydrochlorothiazide  (HYDRODIURIL ) 12.5 MG tablet, TAKE 1 TABLET BY MOUTH EVERY DAY (Patient taking differently: Take 12.5 mg by mouth as needed.), Disp: 90 tablet, Rfl: 2   ipratropium-albuterol  (DUONEB) 0.5-2.5 (3) MG/3ML SOLN, Inhale 3 mLs into the lungs in the morning, at noon, in the evening, and at bedtime., Disp: 360 mL, Rfl: 3   lidocaine  (LIDODERM ) 5 %, Place 1 patch onto the skin daily. Remove & Discard patch within 12 hours or as directed by MD, Disp: 30 patch, Rfl: 0   losartan  (COZAAR ) 25 MG tablet, TAKE 1 TABLET (25 MG TOTAL) BY MOUTH DAILY., Disp: 90 tablet, Rfl: 2   MELATONIN PO, Take 5 mg by mouth at bedtime., Disp: , Rfl:    meloxicam  (MOBIC ) 15 MG tablet, Take 0.5 tablets (7.5 mg total) by mouth daily. (Patient taking differently: Take 7.5 mg by mouth as needed.), Disp: 60 tablet, Rfl: 0   methocarbamol  (ROBAXIN ) 750 MG tablet, TAKE 1 TABLET (750 MG TOTAL) BY MOUTH EVERY 6 (SIX) HOURS AS NEEDED FOR MUSCLE SPASMS (NOT COVERED), Disp: 120 tablet, Rfl: 2   mometasone  (NASONEX ) 50 MCG/ACT nasal spray, Place 2 sprays into the nose every evening., Disp: , Rfl:    montelukast   (SINGULAIR ) 10 MG tablet, TAKE 1 TABLET BY MOUTH EVERYDAY AT BEDTIME, Disp: 90 tablet, Rfl: 3   Multiple Vitamins-Minerals (MULTIVITAMIN WITH MINERALS) tablet, Take 1 tablet by mouth in the morning., Disp: , Rfl:    omeprazole  (PRILOSEC) 40 MG capsule, TAKE 1 CAPSULE (40 MG TOTAL) BY MOUTH IN THE MORNING AND AT BEDTIME., Disp: 180 capsule, Rfl: 3   phentermine  30 MG capsule, Take 1 capsule (30 mg total) by mouth every morning., Disp: 30 capsule, Rfl: 5   topiramate  (TOPAMAX ) 50 MG tablet, Take 1 tablet (50 mg total) by mouth 2 (two) times daily., Disp: 120 tablet, Rfl: 5   traMADol  (ULTRAM ) 50 MG tablet, Take 1-2 tablets (50-100 mg total) by mouth every 6 (six) hours as needed., Disp: 30 tablet, Rfl: 0   rosuvastatin  (CRESTOR ) 10 MG tablet, Take 1 tablet (10 mg total) by  mouth daily., Disp: 90 tablet, Rfl: 3  Current Facility-Administered Medications:    triamcinolone  acetonide (KENALOG ) 10 MG/ML injection 10 mg, 10 mg, Intradermal, Once, Dellar Fenton, DO   Objective:     Vitals:   12/06/23 1033  Pulse: 90  SpO2: 96%  Weight: 218 lb (98.9 kg)  Height: 5\' 4"  (1.626 m)      Body mass index is 37.42 kg/m.    Physical Exam:     Gen: Appears well, nad, nontoxic and pleasant Neuro:sensation intact, strength is 5/5 with df/pf/inv/ev, muscle tone wnl Skin: no suspicious lesion or defmority Psych: A&O, appropriate mood and affect   Left shoulder:  No deformity, swelling or muscle wasting No scapular winging FF 170, abd 130, int 10, ext 70 TTP trapezius, coracoid, biceps groove, humerus, deltoid, cervical spine NTTP over the [place], clavicle, ac  Special testing limited due to limited ROM Neg ant drawer, sulcus sign, apprehension Negative Spurling's test right, positive left FROM of neck   Electronically signed by:  Marshall Skeeter [name] 11:00 AM 12/06/23

## 2023-12-06 ENCOUNTER — Other Ambulatory Visit: Payer: Self-pay

## 2023-12-06 ENCOUNTER — Ambulatory Visit (INDEPENDENT_AMBULATORY_CARE_PROVIDER_SITE_OTHER): Admitting: Physical Therapy

## 2023-12-06 ENCOUNTER — Ambulatory Visit (INDEPENDENT_AMBULATORY_CARE_PROVIDER_SITE_OTHER): Admitting: Sports Medicine

## 2023-12-06 ENCOUNTER — Encounter: Payer: Self-pay | Admitting: Physical Therapy

## 2023-12-06 VITALS — HR 90 | Ht 64.0 in | Wt 218.0 lb

## 2023-12-06 DIAGNOSIS — M25512 Pain in left shoulder: Secondary | ICD-10-CM

## 2023-12-06 DIAGNOSIS — M542 Cervicalgia: Secondary | ICD-10-CM

## 2023-12-06 DIAGNOSIS — G8929 Other chronic pain: Secondary | ICD-10-CM

## 2023-12-06 DIAGNOSIS — F40240 Claustrophobia: Secondary | ICD-10-CM

## 2023-12-06 DIAGNOSIS — M7542 Impingement syndrome of left shoulder: Secondary | ICD-10-CM

## 2023-12-06 DIAGNOSIS — M6281 Muscle weakness (generalized): Secondary | ICD-10-CM | POA: Diagnosis not present

## 2023-12-06 DIAGNOSIS — M5412 Radiculopathy, cervical region: Secondary | ICD-10-CM

## 2023-12-06 MED ORDER — LORAZEPAM 0.5 MG PO TABS: ORAL_TABLET | ORAL | 0 refills | Status: DC

## 2023-12-06 NOTE — Patient Instructions (Signed)
 Left shoulder MRI  Cervical MRI  Follow up 1 week after to discuss results

## 2023-12-06 NOTE — Addendum Note (Signed)
 Addended by: Jamison Soward on: 12/06/2023 03:49 PM   Modules accepted: Orders

## 2023-12-06 NOTE — Therapy (Signed)
 OUTPATIENT PHYSICAL THERAPY TREATMENT    Patient Name: Frances Nelson MRN: [id number] DOB:01/19/1956, 68 y.o., female Today's Date: 12/06/2023   END OF SESSION:  PT End of Session - 12/06/23 1057     Visit Number 13    Number of Visits 17    Date for PT Re-Evaluation 12/13/23    Authorization Type MCR    Progress Note Due on Visit 20    PT Start Time 1055    PT Stop Time 1140    PT Time Calculation (min) 45 min    Activity Tolerance Patient tolerated treatment well    Behavior During Therapy WFL for tasks assessed/performed                         Past Medical History:  Diagnosis Date   Arthritis    Asthma    COPD (chronic obstructive pulmonary disease) (HCC)    GERD (gastroesophageal reflux disease)    Hypertension    Pneumonia    Past Surgical History:  Procedure Laterality Date   ABDOMINAL HYSTERECTOMY     APPENDECTOMY     HERNIA REPAIR     Umbilical x 3   KNEE ARTHROSCOPY W/ MENISCAL REPAIR Left 12/2022   TONSILLECTOMY     TRANSFORAMINAL LUMBAR INTERBODY FUSION (TLIF) WITH PEDICLE SCREW FIXATION 1 LEVEL N/A 05/18/2022   Procedure: Lumbar Four-Five Open Laminectomy/Transforaminal Lumbar Interbody Fusion/Posterolateral fusion;  Surgeon: Cannon Champion, MD;  Location: [facility] OR;  Service: Neurosurgery;  Laterality: N/A;   Patient Active Problem List   Diagnosis Date Noted   Obesity 10/13/2023   Shingles outbreak 05/29/2023   Microhematuria 04/09/2023   Bilateral shoulder pain 04/09/2023   Urethritis 10/05/2022   External otitis of left ear 10/05/2022   Spondylolisthesis of lumbar region 05/18/2022   Smoker 04/08/2022   Deviated nasal septum 04/08/2022   Bilateral pain of leg and foot 04/08/2022   Follicular acne 04/06/2022   Baker cyst, left 04/06/2022   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   Depression 10/04/2021   Left knee pain 10/04/2021   Chronic pain 04/09/2021   Pelvic pain 04/09/2021   HLD  (hyperlipidemia) 04/09/2021   Vitamin D  deficiency 04/09/2021   Estrogen deficiency 04/09/2021   Hyperglycemia 04/01/2021   Chronic cough 12/30/2020   GERD (gastroesophageal reflux disease) 11/06/2019   Bilateral leg edema 10/16/2019   Pleuritic chest pain 10/16/2019   Hypertension 10/02/2019   Arthralgia 06/28/2018   Sinusitis 06/28/2018   Renal cyst 05/03/2018   Syncope 05/03/2018   Stress and adjustment reaction 05/03/2018   History of tobacco use 05/02/2018   Primary osteoarthritis of both hands 04/10/2018   Primary osteoarthritis of both knees 04/10/2018   Primary osteoarthritis of both feet 04/10/2018   Achilles tendinitis 04/02/2018   Screening for breast cancer 03/15/2018   Positive ANA (antinuclear antibody) 03/15/2018   Screening for colon cancer 03/15/2018   Rash 03/15/2018   Achilles tendon pain 11/13/2017   Cervical myelopathy with cervical radiculopathy (HCC) 11/13/2017   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   Allergic rhinitis 06/19/2017   Edema 12/24/2015   Carpal tunnel syndrome, right 12/16/2015   Hypercalcemia 11/11/2015   Pain in both upper extremities 11/11/2015   Drug reaction 10/28/2015   Muscle spasms of both lower extremities 10/28/2015   Nerve pain 10/28/2015   Chronic asthmatic bronchitis with acute exacerbation (HCC) 09/06/2015   Environmental and seasonal allergies 07/17/2014    PCP: Roslyn Coombe, MD  REFERRING PROVIDER: Ulysees Gander, DO  REFERRING DIAG: Chronic left shoulder pain; Chronic pain of left knee; Chronic bilateral low back pain with left-sided sciatica  THERAPY DIAG:  Chronic left shoulder pain  Muscle weakness (generalized)  Rationale for Evaluation and Treatment: Rehabilitation  ONSET DATE: Chronic for a "couple of months"   SUBJECTIVE SUBJECTIVE STATEMENT: Patient reports she just saw the doctor and he ordered an MRI of the neck and shoulder. She is still having discomfort in the left shoulder and neck region.    Eval: Patient reports one day she woke up and her left shoulder was hurting and swollen. The pain has pretty much stayed the same since it started. The left shoulder and arm feel heavy and achy. Sometimes she can move it around but sometimes it feels locked and painful when moving it. She did have an injection earlier this week and it is a little better, but still having some pain but not as bad as when she saw the doctor. She does report the shoulder will crack and pop when she moves it. Pain occurs with all movements overhead, out to the side, and behind back. Her left side of the neck also bothers her. She reports that her whole left arm occasionally goes numb and the middle finger is always numb. She reports difficulty sleeping because of the left shoulder.   Patient also reports neuropathy in both feet and legs, swelling in her legs, a lot of pain in her knees and hips, achilles tendinoplasty on the left, and L4-5 infusion with chronic lower back pain that comes and goes.   PERTINENT HISTORY: See PMH above  PAIN:  Are you having pain? Yes:  NPRS scale: 4.5/10 currently, 8/10 at worst Pain location: Left shoulder Pain description: Heavy, achy, sore Aggravating factors: Moving the left shoulder Relieving factors: Medication, topical cream and patches, ice  PRECAUTIONS: None  PATIENT GOALS: Pain relief and improve use of left arm   OBJECTIVE:  Note: Objective measures were completed at Evaluation unless otherwise noted. PATIENT SURVEYS:  Quick Dash 70.5%    SENSATION: Patient reports sensation deficit of left middle finger, otherwise all WFL but she does not occasional numbness of the left arm  POSTURE: Rounded shoulder posture  CERVICAL ROM:   Active ROM A/PROM (deg) eval   11/20/2023   12/04/2023  Flexion 35    Extension 20    Right lateral flexion     Left lateral flexion     Right rotation 45 70 70  Left rotation 65 55 60   (Blank rows = not tested)  UPPER  EXTREMITY ROM:   Active ROM Right eval Left eval Left 10/30/2023 Left 11/07/2023 Left 11/20/2023 Left 12/04/2023  Shoulder flexion 140 120 135 135 140 140  Shoulder extension        Shoulder abduction 160 120 140     Shoulder adduction        Shoulder internal rotation T8 L1    T10  Shoulder external rotation 60 / T4 50 / C7      Elbow flexion        Elbow extension        Wrist flexion        Wrist extension        Wrist ulnar deviation        Wrist radial deviation        Wrist pronation        Wrist supination        (Blank rows =  not tested)  UPPER EXTREMITY MMT:  MMT Right eval Left eval Left 12/04/2023  Shoulder flexion 4+ 3+ 3+  Shoulder extension     Shoulder abduction 4+ 3+ 3+  Shoulder adduction     Shoulder internal rotation  4   Shoulder external rotation 4+ 4- 4-  Middle trapezius     Lower trapezius     Elbow flexion 5 4   Elbow extension 5 4   Wrist flexion     Wrist extension     Wrist ulnar deviation     Wrist radial deviation     Wrist pronation     Wrist supination     Grip strength (lbs) 49 25   (Blank rows = not tested)  SHOULDER SPECIAL TESTS: Not tested due to symptom irritability  JOINT MOBILITY TESTING:  Hypomobility of the left GHJ and with cervical CPAs  PALPATION:  Tenderness noted left upper trap, middle tap, rhomboid, infraspinatus, greater tubercle, bicep tendon                                                                                                                             TREATMENT  [facility] Adult PT Treatment:                                                DATE: 12/06/2023 UBE L1 x 4 min (fwd/bwd) to improve endurance and workload capacity Suboccipital release with gentle manual traction Supine cervical side glide mobilizations on left Supine passive upper trap and levator scap stretch Supine passive cervical retraction mobilizations Supine left shoulder PROM elevation Supine alternating isometrics for shoulder IR/ER  2 x 10 x 5 sec Seated cervical rotational SNAG to left 2 x 5 Seated cervical retraction 10 x 5 sec Standing wall slide for shoulder elevation 10 x 5 sec  Trigger Point Dry Needling  Subsequent Treatment: Instructions provided previously at initial dry needling treatment.  Instructions reviewed, if requested by the patient, prior to subsequent dry needling treatment.   Patient Verbal Consent Given: Yes Education Handout Provided: Previously Provided Muscles Treated: Left upper trap performed in seated position Electrical Stimulation Performed: No Treatment Response/Outcome: Twitch response   PATIENT EDUCATION: Education details: HEP update Person educated: Patient Education method: Explanation, Demonstration, Tactile cues, Verbal cues, Handout Education comprehension: verbalized understanding, returned demonstration, verbal cues required, tactile cues required, and needs further education  HOME EXERCISE PROGRAM: Access Code: [email or web address]   ASSESSMENT: CLINICAL IMPRESSION: Patient tolerated therapy well with no adverse effects. She continues to report left sided neck and shoulder pain and has been ordered an MRI for further assessment. Continued with TPDN for the left upper trap and a seated position per patent request. Therapy focused on improving cervical mobility and shoulder motion, and continued isometrics for the left rotator cuff as she still is not able  tot tolerate strengthening or active motion exercises. Updated her HEP to include cervical rotation SNAG as she did report improved left cervical rotation following the exercise. Patient would benefit from continued skilled PT to progress mobility and strength in order to reduce pain and maximize functional ability.   Eval: Patient is a 68 y.o. female who was seen today for physical therapy evaluation and treatment for chronic left shoulder and neck pain with left arm and hand numbness. She did exhibit high irritability level of left  shoulder symptoms with limitations in shoulder A/PROM and strength due to pain that are impacting her functional ability. Her left shoulder symptoms seem multifactorial but most consistent with rotator cuff tendinopathy and likely left cervical radiculopathy. She also exhibits tenderness of left cervical and shoulder region and performed TPDN this visit for left upper trap with twitch response. She was previously provided HEP for left shoulder but also provided her with neck stretching and postural exercise this visit.  OBJECTIVE IMPAIRMENTS: decreased activity tolerance, decreased ROM, decreased strength, postural dysfunction, and pain.   ACTIVITY LIMITATIONS: carrying, lifting, sleeping, bathing, dressing, reach over head, and hygiene/grooming  PARTICIPATION LIMITATIONS: meal prep, cleaning, laundry, shopping, and community activity  PERSONAL FACTORS: Fitness, Past/current experiences, and Time since onset of injury/illness/exacerbation are also affecting patient's functional outcome.    GOALS: Goals reviewed with patient? Yes  SHORT TERM GOALS: Target date: 11/15/2023  Patient will be I with initial HEP in order to progress with therapy. Baseline: HEP provided at eval 11/20/2023: independent with initial HEP Goal status: MET  2.  Patient will report left shoulder pain <= 6/10 in when using left arm order to reduce functional limitations Baseline: 8/10 11/20/2023: pain still 8/10 at worst Goal status: ONGOING  3.  Patient will demonstrate left cervical rotation >/= 60 deg in order to reduce neck tension and pain Baseline: 45 deg 11/20/2023: 55 deg Goal status: ONGOING  LONG TERM GOALS: Target date: 12/13/2023  Patient will be I with final HEP to maintain progress from PT. Baseline: HEP provided at eval Goal status: INITIAL  2.  Patient will report QuickDASH </= 45% in order to indicate an improvement in their functional status Baseline: 70.5% Goal status: INITIAL  3.  Patient will  demonstrate improved range of motion of shoulder elevation >/= 140 deg in order to improve ability to perform dressing and grooming tasks Baseline: 120 deg Goal status: INITIAL  4.  Patient will demonstrate left shoulder strength >/= 4/5 MMT in order to improve activity tolerance and use of left arm with household tasks Baseline: see limitations above Goal status: INITIAL   PLAN: PT FREQUENCY: 1-2x/week  PT DURATION: 8 weeks  PLANNED INTERVENTIONS: 97164- PT Re-evaluation, 97110-Therapeutic exercises, 97530- Therapeutic activity, 97112- Neuromuscular re-education, 97535- Self Care, 52841- Manual therapy, 97116- Gait training, Patient/Family education, Taping, Dry Needling, Joint mobilization, Joint manipulation, Spinal manipulation, Spinal mobilization, Cryotherapy, and Moist heat  PLAN FOR NEXT SESSION: Review HEP and progress PRN, manual/TPDN for left neck and shoulder region, left shoulder mobs and PROM, progress left shoulder AAROM->AROM and strengthening as tolerated, postural control; further assessment of cervical and neurodynamic testing   Leah Primus, PT, DPT, LAT, ATC 12/06/23  1:45 PM Phone: [phone] Fax: [phone]

## 2023-12-07 ENCOUNTER — Ambulatory Visit: Admitting: Sports Medicine

## 2023-12-11 ENCOUNTER — Ambulatory Visit

## 2023-12-11 ENCOUNTER — Ambulatory Visit (INDEPENDENT_AMBULATORY_CARE_PROVIDER_SITE_OTHER): Admitting: Physical Therapy

## 2023-12-11 ENCOUNTER — Ambulatory Visit (INDEPENDENT_AMBULATORY_CARE_PROVIDER_SITE_OTHER)

## 2023-12-11 ENCOUNTER — Encounter: Payer: Self-pay | Admitting: Physical Therapy

## 2023-12-11 ENCOUNTER — Other Ambulatory Visit: Payer: Self-pay

## 2023-12-11 ENCOUNTER — Ambulatory Visit (INDEPENDENT_AMBULATORY_CARE_PROVIDER_SITE_OTHER): Admitting: Sports Medicine

## 2023-12-11 DIAGNOSIS — M25512 Pain in left shoulder: Secondary | ICD-10-CM | POA: Diagnosis not present

## 2023-12-11 DIAGNOSIS — M542 Cervicalgia: Secondary | ICD-10-CM | POA: Diagnosis not present

## 2023-12-11 DIAGNOSIS — M6281 Muscle weakness (generalized): Secondary | ICD-10-CM | POA: Diagnosis not present

## 2023-12-11 DIAGNOSIS — G8929 Other chronic pain: Secondary | ICD-10-CM

## 2023-12-11 DIAGNOSIS — M25511 Pain in right shoulder: Secondary | ICD-10-CM

## 2023-12-11 DIAGNOSIS — M5412 Radiculopathy, cervical region: Secondary | ICD-10-CM

## 2023-12-11 DIAGNOSIS — M7542 Impingement syndrome of left shoulder: Secondary | ICD-10-CM

## 2023-12-11 MED ORDER — TRIAMCINOLONE ACETONIDE 40 MG/ML IJ SUSP
40.0000 mg | Freq: Once | INTRAMUSCULAR | Status: AC
  Administered 2023-12-11: 40 mg via INTRA_ARTICULAR

## 2023-12-11 NOTE — Assessment & Plan Note (Signed)
 Injection performed today for left shoulder MR arthrography, further management per primary treating provider.

## 2023-12-11 NOTE — Addendum Note (Signed)
 Addended by: Montgomery Apgar on: 12/11/2023 04:33 PM   Modules accepted: Orders

## 2023-12-11 NOTE — Therapy (Signed)
 OUTPATIENT PHYSICAL THERAPY TREATMENT    Patient Name: Frances Nelson MRN: [id number] DOB:[birth year], 68 y.o., female Today's Date: 12/11/2023   END OF SESSION:  PT End of Session - 12/11/23 1106     Visit Number 14    Number of Visits 17    Date for PT Re-Evaluation 12/13/23    Authorization Type MCR    Progress Note Due on Visit 20    PT Start Time 1102    PT Stop Time 1145    PT Time Calculation (min) 43 min    Activity Tolerance Patient tolerated treatment well    Behavior During Therapy WFL for tasks assessed/performed                          Past Medical History:  Diagnosis Date   Arthritis    Asthma    COPD (chronic obstructive pulmonary disease) (HCC)    GERD (gastroesophageal reflux disease)    Hypertension    Pneumonia    Past Surgical History:  Procedure Laterality Date   ABDOMINAL HYSTERECTOMY     APPENDECTOMY     HERNIA REPAIR     Umbilical x 3   KNEE ARTHROSCOPY W/ MENISCAL REPAIR Left 12/2022   TONSILLECTOMY     TRANSFORAMINAL LUMBAR INTERBODY FUSION (TLIF) WITH PEDICLE SCREW FIXATION 1 LEVEL N/A 05/18/2022   Procedure: Lumbar Four-Five Open Laminectomy/Transforaminal Lumbar Interbody Fusion/Posterolateral fusion;  Surgeon: Cannon Champion, MD;  Location: [facility] OR;  Service: Neurosurgery;  Laterality: N/A;   Patient Active Problem List   Diagnosis Date Noted   Obesity 10/13/2023   Shingles outbreak 05/29/2023   Microhematuria 04/09/2023   Bilateral shoulder pain 04/09/2023   Urethritis 10/05/2022   External otitis of left ear 10/05/2022   Spondylolisthesis of lumbar region 05/18/2022   Smoker 04/08/2022   Deviated nasal septum 04/08/2022   Bilateral pain of leg and foot 04/08/2022   Follicular acne 04/06/2022   Baker cyst, left 04/06/2022   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   Depression 10/04/2021   Left knee pain 10/04/2021   Chronic pain 04/09/2021   Pelvic pain 04/09/2021   HLD  (hyperlipidemia) 04/09/2021   Vitamin D  deficiency 04/09/2021   Estrogen deficiency 04/09/2021   Hyperglycemia 04/01/2021   Chronic cough 12/30/2020   GERD (gastroesophageal reflux disease) 11/06/2019   Bilateral leg edema 10/16/2019   Pleuritic chest pain 10/16/2019   Hypertension 10/02/2019   Arthralgia 06/28/2018   Sinusitis 06/28/2018   Renal cyst 05/03/2018   Syncope 05/03/2018   Stress and adjustment reaction 05/03/2018   History of tobacco use 05/02/2018   Primary osteoarthritis of both hands 04/10/2018   Primary osteoarthritis of both knees 04/10/2018   Primary osteoarthritis of both feet 04/10/2018   Achilles tendinitis 04/02/2018   Screening for breast cancer 03/15/2018   Positive ANA (antinuclear antibody) 03/15/2018   Screening for colon cancer 03/15/2018   Rash 03/15/2018   Achilles tendon pain 11/13/2017   Cervical myelopathy with cervical radiculopathy (HCC) 11/13/2017   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   Allergic rhinitis 06/19/2017   Edema 12/24/2015   Carpal tunnel syndrome, right 12/16/2015   Hypercalcemia 11/11/2015   Pain in both upper extremities 11/11/2015   Drug reaction 10/28/2015   Muscle spasms of both lower extremities 10/28/2015   Nerve pain 10/28/2015   Chronic asthmatic bronchitis with acute exacerbation (HCC) 09/06/2015   Environmental and seasonal allergies 07/17/2014    PCP: Roslyn Coombe,  MD  REFERRING PROVIDER: Ulysees Gander, DO  REFERRING DIAG: Chronic left shoulder pain; Chronic pain of left knee; Chronic bilateral low back pain with left-sided sciatica  THERAPY DIAG:  Chronic left shoulder pain  Muscle weakness (generalized)  Rationale for Evaluation and Treatment: Rehabilitation  ONSET DATE: Chronic for a "couple of months"   SUBJECTIVE SUBJECTIVE STATEMENT: Patient reports she has her MRIs scheduled for this afternoon. She states the should has been cracking and popping all morning.   Eval: Patient reports one day  she woke up and her left shoulder was hurting and swollen. The pain has pretty much stayed the same since it started. The left shoulder and arm feel heavy and achy. Sometimes she can move it around but sometimes it feels locked and painful when moving it. She did have an injection earlier this week and it is a little better, but still having some pain but not as bad as when she saw the doctor. She does report the shoulder will crack and pop when she moves it. Pain occurs with all movements overhead, out to the side, and behind back. Her left side of the neck also bothers her. She reports that her whole left arm occasionally goes numb and the middle finger is always numb. She reports difficulty sleeping because of the left shoulder.   Patient also reports neuropathy in both feet and legs, swelling in her legs, a lot of pain in her knees and hips, achilles tendinoplasty on the left, and L4-5 infusion with chronic lower back pain that comes and goes.   PERTINENT HISTORY: See PMH above  PAIN:  Are you having pain? Yes:  NPRS scale: 4.5/10 currently, 8/10 at worst Pain location: Left shoulder Pain description: Heavy, achy, sore Aggravating factors: Moving the left shoulder Relieving factors: Medication, topical cream and patches, ice  PRECAUTIONS: None  PATIENT GOALS: Pain relief and improve use of left arm   OBJECTIVE:  Note: Objective measures were completed at Evaluation unless otherwise noted. PATIENT SURVEYS:  Quick Dash 70.5%    SENSATION: Patient reports sensation deficit of left middle finger, otherwise all WFL but she does not occasional numbness of the left arm  POSTURE: Rounded shoulder posture  CERVICAL ROM:   Active ROM A/PROM (deg) eval   11/20/2023   12/04/2023  Flexion 35    Extension 20    Right lateral flexion     Left lateral flexion     Right rotation 45 70 70  Left rotation 65 55 60   (Blank rows = not tested)  UPPER EXTREMITY ROM:   Active ROM  Right eval Left eval Left 10/30/2023 Left 11/07/2023 Left 11/20/2023 Left 12/04/2023  Shoulder flexion 140 120 135 135 140 140  Shoulder extension        Shoulder abduction 160 120 140     Shoulder adduction        Shoulder internal rotation T8 L1    T10  Shoulder external rotation 60 / T4 50 / C7      Elbow flexion        Elbow extension        Wrist flexion        Wrist extension        Wrist ulnar deviation        Wrist radial deviation        Wrist pronation        Wrist supination        (Blank rows = not tested)  UPPER EXTREMITY  MMT:  MMT Right eval Left eval Left 12/04/2023  Shoulder flexion 4+ 3+ 3+  Shoulder extension     Shoulder abduction 4+ 3+ 3+  Shoulder adduction     Shoulder internal rotation  4   Shoulder external rotation 4+ 4- 4-  Middle trapezius     Lower trapezius     Elbow flexion 5 4   Elbow extension 5 4   Wrist flexion     Wrist extension     Wrist ulnar deviation     Wrist radial deviation     Wrist pronation     Wrist supination     Grip strength (lbs) 49 25   (Blank rows = not tested)  SHOULDER SPECIAL TESTS: Not tested due to symptom irritability  JOINT MOBILITY TESTING:  Hypomobility of the left GHJ and with cervical CPAs  PALPATION:  Tenderness noted left upper trap, middle tap, rhomboid, infraspinatus, greater tubercle, bicep tendon                                                                                                                             TREATMENT  [facility] Adult PT Treatment:                                                DATE: 12/11/2023 UBE L3 x 5 min (fwd) to improve endurance and workload capacity Row with blue 2 x 15 Extension with green 2 x 10 Standing wall slide 10 x 5 sec Standing shoulder flexion isometric 2 x 10 x 5 sec Supine shoulder alternating IR/ER isometrics 2 x 10 x 5 sec  Trigger Point Dry Needling  Subsequent Treatment: Instructions provided previously at initial dry needling treatment.   Instructions reviewed, if requested by the patient, prior to subsequent dry needling treatment.   Patient Verbal Consent Given: Yes Education Handout Provided: Previously Provided Muscles Treated: Left upper trap performed in seated position Electrical Stimulation Performed: No Treatment Response/Outcome: Twitch response   PATIENT EDUCATION: Education details: HEP, TPDN Person educated: Patient Education method: Explanation, Demonstration, Tactile cues, Verbal cues Education comprehension: verbalized understanding, returned demonstration, verbal cues required, tactile cues required, and needs further education  HOME EXERCISE PROGRAM: Access Code: [email or web address]   ASSESSMENT: CLINICAL IMPRESSION: Patient tolerated therapy well with no adverse effects. Therapy focused on progressing left shoulder strength and endurance with good tolerance. Continued with TPDN for the left upper trap as this seems to be beneficial for her. She continues to report left shoulder popping with active movement. Patient is scheduled for cervical and left shoulder MRI later today. No changes were made to her HEP this visit. Patient would benefit from continued skilled PT to progress mobility and strength in order to reduce pain and maximize functional ability.   Eval: Patient is a 68 y.o. female who was seen today  for physical therapy evaluation and treatment for chronic left shoulder and neck pain with left arm and hand numbness. She did exhibit high irritability level of left shoulder symptoms with limitations in shoulder A/PROM and strength due to pain that are impacting her functional ability. Her left shoulder symptoms seem multifactorial but most consistent with rotator cuff tendinopathy and likely left cervical radiculopathy. She also exhibits tenderness of left cervical and shoulder region and performed TPDN this visit for left upper trap with twitch response. She was previously provided HEP for left shoulder but  also provided her with neck stretching and postural exercise this visit.  OBJECTIVE IMPAIRMENTS: decreased activity tolerance, decreased ROM, decreased strength, postural dysfunction, and pain.   ACTIVITY LIMITATIONS: carrying, lifting, sleeping, bathing, dressing, reach over head, and hygiene/grooming  PARTICIPATION LIMITATIONS: meal prep, cleaning, laundry, shopping, and community activity  PERSONAL FACTORS: Fitness, Past/current experiences, and Time since onset of injury/illness/exacerbation are also affecting patient's functional outcome.    GOALS: Goals reviewed with patient? Yes  SHORT TERM GOALS: Target date: 11/15/2023  Patient will be I with initial HEP in order to progress with therapy. Baseline: HEP provided at eval 11/20/2023: independent with initial HEP Goal status: MET  2.  Patient will report left shoulder pain <= 6/10 in when using left arm order to reduce functional limitations Baseline: 8/10 11/20/2023: pain still 8/10 at worst Goal status: ONGOING  3.  Patient will demonstrate left cervical rotation >/= 60 deg in order to reduce neck tension and pain Baseline: 45 deg 11/20/2023: 55 deg Goal status: ONGOING  LONG TERM GOALS: Target date: 12/13/2023  Patient will be I with final HEP to maintain progress from PT. Baseline: HEP provided at eval Goal status: INITIAL  2.  Patient will report QuickDASH </= 45% in order to indicate an improvement in their functional status Baseline: 70.5% Goal status: INITIAL  3.  Patient will demonstrate improved range of motion of shoulder elevation >/= 140 deg in order to improve ability to perform dressing and grooming tasks Baseline: 120 deg Goal status: INITIAL  4.  Patient will demonstrate left shoulder strength >/= 4/5 MMT in order to improve activity tolerance and use of left arm with household tasks Baseline: see limitations above Goal status: INITIAL   PLAN: PT FREQUENCY: 1-2x/week  PT DURATION: 8 weeks  PLANNED  INTERVENTIONS: 97164- PT Re-evaluation, 97110-Therapeutic exercises, 97530- Therapeutic activity, 97112- Neuromuscular re-education, 97535- Self Care, 16109- Manual therapy, 97116- Gait training, Patient/Family education, Taping, Dry Needling, Joint mobilization, Joint manipulation, Spinal manipulation, Spinal mobilization, Cryotherapy, and Moist heat  PLAN FOR NEXT SESSION: Review HEP and progress PRN, manual/TPDN for left neck and shoulder region, left shoulder mobs and PROM, progress left shoulder AAROM->AROM and strengthening as tolerated, postural control; further assessment of cervical and neurodynamic testing   Leah Primus, PT, DPT, LAT, ATC 12/11/23  12:32 PM Phone: [phone] Fax: [phone]

## 2023-12-11 NOTE — Progress Notes (Signed)
    Procedures performed today:    Procedure: Real-time Ultrasound Guided gadolinium contrast injection of left glenohumeral joint Device: Samsung HS60  Verbal informed consent obtained.  Time-out conducted.  Noted no overlying erythema, induration, or other signs of local infection.  Skin prepped in a sterile fashion.  Local anesthesia: Topical Ethyl chloride.  With sterile technique and under real time ultrasound guidance: 22-gauge spinal needle advanced into the joint from a posterior approach, I injected 1 cc kenalog  40, 2 cc lidocaine , 2 cc bupivacaine, syringe switched and 0.1 cc gadolinium injected, syringe again switched and 10 cc sterile saline used to fully distend the joint. Joint visualized and capsule seen distending confirming intra-articular placement of contrast material and medication. Completed without difficulty  Advised to call if fevers/chills, erythema, induration, drainage, or persistent bleeding.  Images permanently stored in PACS Impression: Technically successful ultrasound guided gadolinium contrast injection for MR arthrography.  Please see separate MR arthrogram report.  Independent interpretation of notes and tests performed by another provider:   None.  Brief History, Exam, Impression, and Recommendations:    Bilateral shoulder pain Injection performed today for left shoulder MR arthrography, further management per primary treating provider.    ____________________________________________ Joselyn Nicely. Sandy Crumb, M.D., ABFM., CAQSM., AME. Primary Care and Sports Medicine [facility]  Adjunct Professor of [facility] Medicine  [employer]  Restaurant manager, fast food

## 2023-12-13 ENCOUNTER — Ambulatory Visit (INDEPENDENT_AMBULATORY_CARE_PROVIDER_SITE_OTHER): Admitting: Physical Therapy

## 2023-12-13 ENCOUNTER — Encounter: Payer: Self-pay | Admitting: Physical Therapy

## 2023-12-13 ENCOUNTER — Other Ambulatory Visit: Payer: Self-pay

## 2023-12-13 DIAGNOSIS — M25512 Pain in left shoulder: Secondary | ICD-10-CM | POA: Diagnosis not present

## 2023-12-13 DIAGNOSIS — M6281 Muscle weakness (generalized): Secondary | ICD-10-CM | POA: Diagnosis not present

## 2023-12-13 DIAGNOSIS — G8929 Other chronic pain: Secondary | ICD-10-CM | POA: Diagnosis not present

## 2023-12-13 NOTE — Therapy (Signed)
 OUTPATIENT PHYSICAL THERAPY TREATMENT    Patient Name: Frances Nelson MRN: [id number] DOB:[birth year], 68 y.o., female Today's Date: 12/13/2023   END OF SESSION:  PT End of Session - 12/13/23 1115     Visit Number 15    Number of Visits 21    Date for PT Re-Evaluation 01/24/24    Authorization Type MCR    Progress Note Due on Visit 20    PT Start Time 1104    PT Stop Time 1142    PT Time Calculation (min) 38 min    Activity Tolerance Patient tolerated treatment well    Behavior During Therapy WFL for tasks assessed/performed                           Past Medical History:  Diagnosis Date   Arthritis    Asthma    COPD (chronic obstructive pulmonary disease) (HCC)    GERD (gastroesophageal reflux disease)    Hypertension    Pneumonia    Past Surgical History:  Procedure Laterality Date   ABDOMINAL HYSTERECTOMY     APPENDECTOMY     HERNIA REPAIR     Umbilical x 3   KNEE ARTHROSCOPY W/ MENISCAL REPAIR Left 12/2022   TONSILLECTOMY     TRANSFORAMINAL LUMBAR INTERBODY FUSION (TLIF) WITH PEDICLE SCREW FIXATION 1 LEVEL N/A 05/18/2022   Procedure: Lumbar Four-Five Open Laminectomy/Transforaminal Lumbar Interbody Fusion/Posterolateral fusion;  Surgeon: Cannon Champion, MD;  Location: [facility] OR;  Service: Neurosurgery;  Laterality: N/A;   Patient Active Problem List   Diagnosis Date Noted   Obesity 10/13/2023   Shingles outbreak 05/29/2023   Microhematuria 04/09/2023   Bilateral shoulder pain 04/09/2023   Urethritis 10/05/2022   External otitis of left ear 10/05/2022   Spondylolisthesis of lumbar region 05/18/2022   Smoker 04/08/2022   Deviated nasal septum 04/08/2022   Bilateral pain of leg and foot 04/08/2022   Follicular acne 04/06/2022   Baker cyst, left 04/06/2022   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   Depression 10/04/2021   Left knee pain 10/04/2021   Chronic pain 04/09/2021   Pelvic pain 04/09/2021    HLD (hyperlipidemia) 04/09/2021   Vitamin D  deficiency 04/09/2021   Estrogen deficiency 04/09/2021   Hyperglycemia 04/01/2021   Chronic cough 12/30/2020   GERD (gastroesophageal reflux disease) 11/06/2019   Bilateral leg edema 10/16/2019   Pleuritic chest pain 10/16/2019   Hypertension 10/02/2019   Arthralgia 06/28/2018   Sinusitis 06/28/2018   Renal cyst 05/03/2018   Syncope 05/03/2018   Stress and adjustment reaction 05/03/2018   History of tobacco use 05/02/2018   Primary osteoarthritis of both hands 04/10/2018   Primary osteoarthritis of both knees 04/10/2018   Primary osteoarthritis of both feet 04/10/2018   Achilles tendinitis 04/02/2018   Screening for breast cancer 03/15/2018   Positive ANA (antinuclear antibody) 03/15/2018   Screening for colon cancer 03/15/2018   Rash 03/15/2018   Achilles tendon pain 11/13/2017   Cervical myelopathy with cervical radiculopathy (HCC) 11/13/2017   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   Allergic rhinitis 06/19/2017   Edema 12/24/2015   Carpal tunnel syndrome, right 12/16/2015   Hypercalcemia 11/11/2015   Pain in both upper extremities 11/11/2015   Drug reaction 10/28/2015   Muscle spasms of both lower extremities 10/28/2015   Nerve pain 10/28/2015   Chronic asthmatic bronchitis with acute exacerbation (HCC) 09/06/2015   Environmental and seasonal allergies 07/17/2014    PCP: Rosalia Colonel  W, MD  REFERRING PROVIDER: Ulysees Gander, DO  REFERRING DIAG: Chronic left shoulder pain; Chronic pain of left knee; Chronic bilateral low back pain with left-sided sciatica  THERAPY DIAG:  Chronic left shoulder pain  Muscle weakness (generalized)  Rationale for Evaluation and Treatment: Rehabilitation  ONSET DATE: Chronic for a "couple of months"   SUBJECTIVE SUBJECTIVE STATEMENT: Patient reports she had the MRI and states it was bad, it caused more pain, and she hasn't felt good since the MRI from the injection or the medication.    Eval: Patient reports one day she woke up and her left shoulder was hurting and swollen. The pain has pretty much stayed the same since it started. The left shoulder and arm feel heavy and achy. Sometimes she can move it around but sometimes it feels locked and painful when moving it. She did have an injection earlier this week and it is a little better, but still having some pain but not as bad as when she saw the doctor. She does report the shoulder will crack and pop when she moves it. Pain occurs with all movements overhead, out to the side, and behind back. Her left side of the neck also bothers her. She reports that her whole left arm occasionally goes numb and the middle finger is always numb. She reports difficulty sleeping because of the left shoulder.   Patient also reports neuropathy in both feet and legs, swelling in her legs, a lot of pain in her knees and hips, achilles tendinoplasty on the left, and L4-5 infusion with chronic lower back pain that comes and goes.   PERTINENT HISTORY: See PMH above  PAIN:  Are you having pain? Yes:  NPRS scale: 4.5/10 currently, 8/10 at worst Pain location: Left shoulder Pain description: Heavy, achy, sore Aggravating factors: Moving the left shoulder Relieving factors: Medication, topical cream and patches, ice  PRECAUTIONS: None  PATIENT GOALS: Pain relief and improve use of left arm   OBJECTIVE:  Note: Objective measures were completed at Evaluation unless otherwise noted. PATIENT SURVEYS:  [name] 70.5%  12/13/2023: 65.9%    SENSATION: Patient reports sensation deficit of left middle finger, otherwise all WFL but she does not occasional numbness of the left arm  POSTURE: Rounded shoulder posture  CERVICAL ROM:   Active ROM A/PROM (deg) eval   11/20/2023   12/04/2023  Flexion 35    Extension 20    Right lateral flexion     Left lateral flexion     Right rotation 45 70 70  Left rotation 65 55 60   (Blank rows = not  tested)  UPPER EXTREMITY ROM:   Active ROM Right eval Left eval Left 10/30/2023 Left 11/07/2023 Left 11/20/2023 Left 12/04/2023 Left 12/13/2023  Shoulder flexion 140 120 135 135 140 140 140  Shoulder extension         Shoulder abduction 160 120 140      Shoulder adduction         Shoulder internal rotation T8 L1    T10   Shoulder external rotation 60 / T4 50 / C7       Elbow flexion         Elbow extension         Wrist flexion         Wrist extension         Wrist ulnar deviation         Wrist radial deviation  Wrist pronation         Wrist supination         (Blank rows = not tested)  UPPER EXTREMITY MMT:  MMT Right eval Left eval Left 12/04/2023  Shoulder flexion 4+ 3+ 3+  Shoulder extension     Shoulder abduction 4+ 3+ 3+  Shoulder adduction     Shoulder internal rotation  4   Shoulder external rotation 4+ 4- 4-  Middle trapezius     Lower trapezius     Elbow flexion 5 4   Elbow extension 5 4   Wrist flexion     Wrist extension     Wrist ulnar deviation     Wrist radial deviation     Wrist pronation     Wrist supination     Grip strength (lbs) 49 25   (Blank rows = not tested)  SHOULDER SPECIAL TESTS: Not tested due to symptom irritability  JOINT MOBILITY TESTING:  Hypomobility of the left GHJ and with cervical CPAs  PALPATION:  Tenderness noted left upper trap, middle tap, rhomboid, infraspinatus, greater tubercle, bicep tendon                                                                                                                             TREATMENT  [facility] Adult PT Treatment:                                                DATE: 12/13/2023 UBE L1 x 4 min (fwd/bwd) to improve endurance and workload capacity STM for left upper trap, levator scap, rhomboid, cervical paraspinals Supine suboccipital release with gentle manual traction Passive levator scap and upper trap stretch Seated upper trap stretch 3 x 20 sec Seated shoulder blade  squeezes 10 x 5 sec Seated cervical retraction 10 x 5 sec  PATIENT EDUCATION: Education details: POC extension, HEP Person educated: Patient Education method: Programmer, multimedia, Demonstration, Actor cues, Verbal cues Education comprehension: verbalized understanding, returned demonstration, verbal cues required, tactile cues required, and needs further education  HOME EXERCISE PROGRAM: Access Code: [email or web address]   ASSESSMENT: CLINICAL IMPRESSION: Patient tolerated therapy well with no adverse effects. She arrives reporting increased neck and left shoulder pain following the contrast injection and MRI she had following last visit. Performed STM and stretching for the cervical region with good benefit. She does report an improvement in her functional status on QuickDASH this visit and has improved with her cervical and shoulder motion, but continues to report high levels of pain and poor strength of the left shoulder.  No changes were made to her HEP this visit. Patient would benefit from continued skilled PT to progress mobility and strength in order to reduce pain and maximize functional ability, so will extend her PT POC for 6 more weeks.   Eval: Patient is a 68  y.o. female who was seen today for physical therapy evaluation and treatment for chronic left shoulder and neck pain with left arm and hand numbness. She did exhibit high irritability level of left shoulder symptoms with limitations in shoulder A/PROM and strength due to pain that are impacting her functional ability. Her left shoulder symptoms seem multifactorial but most consistent with rotator cuff tendinopathy and likely left cervical radiculopathy. She also exhibits tenderness of left cervical and shoulder region and performed TPDN this visit for left upper trap with twitch response. She was previously provided HEP for left shoulder but also provided her with neck stretching and postural exercise this visit.  OBJECTIVE IMPAIRMENTS: decreased  activity tolerance, decreased ROM, decreased strength, postural dysfunction, and pain.   ACTIVITY LIMITATIONS: carrying, lifting, sleeping, bathing, dressing, reach over head, and hygiene/grooming  PARTICIPATION LIMITATIONS: meal prep, cleaning, laundry, shopping, and community activity  PERSONAL FACTORS: Fitness, Past/current experiences, and Time since onset of injury/illness/exacerbation are also affecting patient's functional outcome.    GOALS: Goals reviewed with patient? Yes  SHORT TERM GOALS: Target date: 11/15/2023  Patient will be I with initial HEP in order to progress with therapy. Baseline: HEP provided at eval 11/20/2023: independent with initial HEP Goal status: MET  2.  Patient will report left shoulder pain <= 6/10 in when using left arm order to reduce functional limitations Baseline: 8/10 11/20/2023: pain still 8/10 at worst 12/13/2023: continues to report severe pain Goal status: ONGOING  3.  Patient will demonstrate left cervical rotation >/= 60 deg in order to reduce neck tension and pain Baseline: 45 deg 11/20/2023: 55 deg 12/04/2023: 60 deg Goal status: MET  LONG TERM GOALS: Target date: 01/24/2024  Patient will be I with final HEP to maintain progress from PT. Baseline: HEP provided at eval 12/13/2023: progressing Goal status: ONGOING  2.  Patient will report QuickDASH </= 45% in order to indicate an improvement in their functional status Baseline: 70.5% 12/13/2023: 65.9% Goal status: ONGOING  3.  Patient will demonstrate improved range of motion of shoulder elevation >/= 140 deg in order to improve ability to perform dressing and grooming tasks Baseline: 120 deg 12/13/2023: 140 deg Goal status: MET  4.  Patient will demonstrate left shoulder strength >/= 4/5 MMT in order to improve activity tolerance and use of left arm with household tasks Baseline: see limitations above 12/13/2023: not assessed due to pain Goal status: ONGOING   PLAN: PT FREQUENCY:  1-2x/week  PT DURATION: 6 weeks  PLANNED INTERVENTIONS: 97164- PT Re-evaluation, 97110-Therapeutic exercises, 97530- Therapeutic activity, 97112- Neuromuscular re-education, 97535- Self Care, [address]- Manual therapy, 97116- Gait training, Patient/Family education, Taping, Dry Needling, Joint mobilization, Joint manipulation, Spinal manipulation, Spinal mobilization, Cryotherapy, and Moist heat  PLAN FOR NEXT SESSION: Review HEP and progress PRN, manual/TPDN for left neck and shoulder region, left shoulder mobs and PROM, progress left shoulder AAROM->AROM and strengthening as tolerated, postural control; further assessment of cervical and neurodynamic testing   Leah Primus, PT, DPT, LAT, ATC 12/13/23  12:00 PM Phone: [phone] Fax: [phone]

## 2023-12-17 NOTE — Progress Notes (Signed)
 [facility] [address] [place] [address] Phone: [phone]   Assessment and Plan:     1. Chronic left shoulder pain 2. Tear of left supraspinatus tendon 3. Osteoarthritis of left glenohumeral joint -Chronic with exacerbation, subsequent visit - Continued significant left shoulder pain with pain radiating into neck and down left upper extremity.  Most consistent with MRI shoulder findings including supraspinatus tearing, osteoarthritis of glenohumeral and AC joint leading to compensatory pain into trapezius and irritating neurologic structures causing radicular symptoms in the left upper extremity.  No significant findings in cervical spine MRI - Patient has had no significant improvement despite conservative therapy with home exercise program, physical therapy, NSAID course, prednisone  use.  Patient has had no improvement with subacromial CSI or intra-articular CSI with MR arthrogram.  With failed conservative management, I believe patient would benefit from orthopedic surgery evaluation.  Referral sent - Needs Tylenol  for day-to-day pain relief - Continue HEP and physical therapy as tolerated   Pertinent previous records reviewed include MRI left shoulder and cervical spine 12/11/2023  Follow Up: As needed   Subjective:   I, Frances Nelson am a scribe for Dr. Cleora Daft.    Chief Complaint: left shoulder pain    HPI:    10/15/2023 Patient is a 68 year old female with left shoulder pain. Patient states has been having some dizziness.  Pain keeps her up at night. Try to sleep on back as much as possible. Wakes up lying on her left side. Worse when she gets up in the morning but the pain is consistent all day.   Duration?about a month now Did you have an Injury to cause this pain? no Taking Medication for pain? Tylenol  arthritis, tramadol  Numbness or Tingling?yes second finger completely numb Does the pain Radiate? Yes (from  neck down to fingers) Altered gait or use? yes ROM/ impairment of movement? All ranges are painful   11/09/2023 Patient state she is sore but just finished PT    12/06/2023 Patient states still in pain . Still getting popping and clicking   12/18/2023 Patient states that she is not good today. Left shoulder today.    Relevant Historical Information: Hypertension, GERD, COPD, history of positive ANA, status post lumbar fusion  Additional pertinent review of systems negative.   Current Outpatient Medications:    acetaminophen  (TYLENOL ) 650 MG CR tablet, Take 1,300 mg by mouth as needed., Disp: , Rfl:    acidophilus (RISAQUAD) CAPS capsule, Take 1 capsule by mouth in the morning., Disp: , Rfl:    Adapalene  (DIFFERIN ) 0.3 % gel, Apply 1 Application topically at bedtime. Apply a pea sized amount to face and behind ears 3 nights weekly, Disp: 45 g, Rfl: 2   alendronate  (FOSAMAX ) 70 MG tablet, Take 1 tablet (70 mg total) by mouth every 7 (seven) days. Take with a full glass of water on an empty stomach., Disp: 12 tablet, Rfl: 3   B Complex Vitamins (VITAMIN-B COMPLEX PO), Take by mouth daily., Disp: , Rfl:    Calcium -Vitamins C & D (CALCIUM /C/D PO), Take 1 tablet by mouth in the morning., Disp: , Rfl:    celecoxib  (CELEBREX ) 100 MG capsule, Take 1 capsule (100 mg total) by mouth 2 (two) times daily., Disp: 60 capsule, Rfl: 1   cetirizine (ZYRTEC) 10 MG tablet, Take 10 mg by mouth in the morning., Disp: , Rfl:    Cholecalciferol (HM VITAMIN D3 PO), Take 5,000 Units by mouth in the  morning., Disp: , Rfl:    clindamycin  (CLEOCIN  T) 1 % SWAB, APPLY 1 APPLICATION TOPICALLY IN THE MORNING. WIPE ON FACE AND BEHIND EARS EVERY MORNING, Disp: 60 each, Rfl: 2   clobetasol  cream (TEMOVATE ) 0.05 %, Apply 1 Application topically 2 (two) times daily. Apply to affected areas 2 x daily for 2 weeks then stop. Use as needed (Patient taking differently: Apply 1 Application topically as needed. Apply to affected areas  2 x daily for 2 weeks then stop. Use as needed), Disp: 45 g, Rfl: 0   COLLAGEN PO, Take by mouth. Collagen Peptides, Disp: , Rfl:    cyclobenzaprine  (FLEXERIL ) 5 MG tablet, Take 1 tablet (5 mg total) by mouth at bedtime., Disp: 30 tablet, Rfl: 0   doxycycline  (VIBRAMYCIN ) 50 MG capsule, Take 1 capsule (50 mg total) by mouth daily. With food and plenty of fluid, Disp: 30 capsule, Rfl: 3   DULoxetine  (CYMBALTA ) 30 MG capsule, TAKE 1 TAB BY MOUTH IN THE AM AND 2 IN THE PM, Disp: 270 capsule, Rfl: 4   ezetimibe  (ZETIA ) 10 MG tablet, Take 1 tablet (10 mg total) by mouth daily., Disp: 90 tablet, Rfl: 3   gabapentin  (NEURONTIN ) 100 MG capsule, Take 1 capsule (100 mg total) by mouth 3 (three) times daily., Disp: 90 capsule, Rfl: 5   Homeopathic Products (ZICAM ALLERGY RELIEF NA), Place into the nose., Disp: , Rfl:    hydrochlorothiazide  (HYDRODIURIL ) 12.5 MG tablet, TAKE 1 TABLET BY MOUTH EVERY DAY (Patient taking differently: Take 12.5 mg by mouth as needed.), Disp: 90 tablet, Rfl: 2   ipratropium-albuterol  (DUONEB) 0.5-2.5 (3) MG/3ML SOLN, Inhale 3 mLs into the lungs in the morning, at noon, in the evening, and at bedtime., Disp: 360 mL, Rfl: 3   lidocaine  (LIDODERM ) 5 %, Place 1 patch onto the skin daily. Remove & Discard patch within 12 hours or as directed by MD, Disp: 30 patch, Rfl: 0   LORazepam  (ATIVAN ) 0.5 MG tablet, 1-2 tabs 30 - 60 min prior to MRI. Do not drive with this medicine., Disp: 4 tablet, Rfl: 0   losartan  (COZAAR ) 25 MG tablet, TAKE 1 TABLET (25 MG TOTAL) BY MOUTH DAILY., Disp: 90 tablet, Rfl: 2   MELATONIN PO, Take 5 mg by mouth at bedtime., Disp: , Rfl:    meloxicam  (MOBIC ) 15 MG tablet, Take 0.5 tablets (7.5 mg total) by mouth daily. (Patient taking differently: Take 7.5 mg by mouth as needed.), Disp: 60 tablet, Rfl: 0   meloxicam  (MOBIC ) 15 MG tablet, Take 1 tablet (15 mg total) by mouth daily., Disp: 30 tablet, Rfl: 0   methocarbamol  (ROBAXIN ) 750 MG tablet, TAKE 1 TABLET (750 MG  TOTAL) BY MOUTH EVERY 6 (SIX) HOURS AS NEEDED FOR MUSCLE SPASMS (NOT COVERED), Disp: 120 tablet, Rfl: 2   mometasone  (NASONEX ) 50 MCG/ACT nasal spray, Place 2 sprays into the nose every evening., Disp: , Rfl:    montelukast  (SINGULAIR ) 10 MG tablet, TAKE 1 TABLET BY MOUTH EVERYDAY AT BEDTIME, Disp: 90 tablet, Rfl: 3   Multiple Vitamins-Minerals (MULTIVITAMIN WITH MINERALS) tablet, Take 1 tablet by mouth in the morning., Disp: , Rfl:    omeprazole  (PRILOSEC) 40 MG capsule, TAKE 1 CAPSULE (40 MG TOTAL) BY MOUTH IN THE MORNING AND AT BEDTIME., Disp: 180 capsule, Rfl: 3   phentermine  30 MG capsule, Take 1 capsule (30 mg total) by mouth every morning., Disp: 30 capsule, Rfl: 5   topiramate  (TOPAMAX ) 50 MG tablet, Take 1 tablet (50 mg total) by mouth 2 (  two) times daily., Disp: 120 tablet, Rfl: 5   traMADol  (ULTRAM ) 50 MG tablet, Take 1-2 tablets (50-100 mg total) by mouth every 6 (six) hours as needed., Disp: 30 tablet, Rfl: 0   rosuvastatin  (CRESTOR ) 10 MG tablet, Take 1 tablet (10 mg total) by mouth daily., Disp: 90 tablet, Rfl: 3  Current Facility-Administered Medications:    triamcinolone  acetonide (KENALOG ) 10 MG/ML injection 10 mg, 10 mg, Intradermal, Once, Dellar Fenton, DO   Objective:     Vitals:   12/18/23 1029  BP: 130/70  Pulse: 96  SpO2: 98%  Weight: 219 lb (99.3 kg)  Height: 5\' 4"  (1.626 m)      Body mass index is 37.59 kg/m.    Physical Exam:    Gen: Appears well, nad, nontoxic and pleasant Neuro:sensation intact, strength is 5/5 with df/pf/inv/ev, muscle tone wnl Skin: no suspicious lesion or defmority Psych: A&O, appropriate mood and affect   Left shoulder:  No deformity, swelling or muscle wasting No scapular winging FF 170, abd 130, int 10, ext 70 TTP trapezius, coracoid, biceps groove, humerus, deltoid, cervical spine NTTP over the [place], clavicle, ac  Special testing limited due to limited ROM Neg ant drawer, sulcus sign, apprehension Negative Spurling's  test right, positive left FROM of neck     Electronically signed by:  Marshall Skeeter [facility] 12:22 PM 12/18/23

## 2023-12-18 ENCOUNTER — Ambulatory Visit (INDEPENDENT_AMBULATORY_CARE_PROVIDER_SITE_OTHER): Admitting: Sports Medicine

## 2023-12-18 VITALS — BP 130/70 | HR 96 | Ht 64.0 in | Wt 219.0 lb

## 2023-12-18 DIAGNOSIS — M19012 Primary osteoarthritis, left shoulder: Secondary | ICD-10-CM

## 2023-12-18 DIAGNOSIS — M75102 Unspecified rotator cuff tear or rupture of left shoulder, not specified as traumatic: Secondary | ICD-10-CM

## 2023-12-18 DIAGNOSIS — G8929 Other chronic pain: Secondary | ICD-10-CM | POA: Diagnosis not present

## 2023-12-18 DIAGNOSIS — M25512 Pain in left shoulder: Secondary | ICD-10-CM

## 2023-12-18 MED ORDER — MELOXICAM 15 MG PO TABS: 15.0000 mg | ORAL_TABLET | Freq: Every day | ORAL | 0 refills | Status: DC

## 2023-12-18 MED ORDER — CYCLOBENZAPRINE HCL 5 MG PO TABS: 5.0000 mg | ORAL_TABLET | Freq: Every day | ORAL | 0 refills | Status: DC

## 2023-12-18 NOTE — Patient Instructions (Addendum)
 Tylenol  [phone] mg 2-3 times a day for pain relief  Meloxicam  15 mg daily as needed for breakthrough pain  Flexeril  5-10 mg nightly as needed for muscle spasm  Ortho referral  As needed follow up

## 2023-12-25 ENCOUNTER — Ambulatory Visit (INDEPENDENT_AMBULATORY_CARE_PROVIDER_SITE_OTHER): Admitting: Physical Therapy

## 2023-12-25 ENCOUNTER — Other Ambulatory Visit: Payer: Self-pay

## 2023-12-25 ENCOUNTER — Encounter: Payer: Self-pay | Admitting: Physical Therapy

## 2023-12-25 DIAGNOSIS — G8929 Other chronic pain: Secondary | ICD-10-CM

## 2023-12-25 DIAGNOSIS — M6281 Muscle weakness (generalized): Secondary | ICD-10-CM

## 2023-12-25 DIAGNOSIS — M25512 Pain in left shoulder: Secondary | ICD-10-CM

## 2023-12-25 NOTE — Therapy (Signed)
 OUTPATIENT PHYSICAL THERAPY TREATMENT    Patient Name: Frances Nelson MRN: [id number] DOB:1956-05-06, 68 y.o., female Today's Date: 12/25/2023   END OF SESSION:  PT End of Session - 12/25/23 1510     Visit Number 16    Number of Visits 21    Date for PT Re-Evaluation 01/24/24    Authorization Type MCR    Progress Note Due on Visit 20    PT Start Time 1430    PT Stop Time 1510    PT Time Calculation (min) 40 min    Activity Tolerance Patient tolerated treatment well    Behavior During Therapy WFL for tasks assessed/performed                            Past Medical History:  Diagnosis Date   Arthritis    Asthma    COPD (chronic obstructive pulmonary disease) (HCC)    GERD (gastroesophageal reflux disease)    Hypertension    Pneumonia    Past Surgical History:  Procedure Laterality Date   ABDOMINAL HYSTERECTOMY     APPENDECTOMY     HERNIA REPAIR     Umbilical x 3   KNEE ARTHROSCOPY W/ MENISCAL REPAIR Left 12/2022   TONSILLECTOMY     TRANSFORAMINAL LUMBAR INTERBODY FUSION (TLIF) WITH PEDICLE SCREW FIXATION 1 LEVEL N/A 05/18/2022   Procedure: Lumbar Four-Five Open Laminectomy/Transforaminal Lumbar Interbody Fusion/Posterolateral fusion;  Surgeon: Cannon Champion, MD;  Location: [facility] OR;  Service: Neurosurgery;  Laterality: N/A;   Patient Active Problem List   Diagnosis Date Noted   Obesity 10/13/2023   Shingles outbreak 05/29/2023   Microhematuria 04/09/2023   Bilateral shoulder pain 04/09/2023   Urethritis 10/05/2022   External otitis of left ear 10/05/2022   Spondylolisthesis of lumbar region 05/18/2022   Smoker 04/08/2022   Deviated nasal septum 04/08/2022   Bilateral pain of leg and foot 04/08/2022   Follicular acne 04/06/2022   Baker cyst, left 04/06/2022   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   Depression 10/04/2021   Left knee pain 10/04/2021   Chronic pain 04/09/2021   Pelvic pain 04/09/2021    HLD (hyperlipidemia) 04/09/2021   Vitamin D  deficiency 04/09/2021   Estrogen deficiency 04/09/2021   Hyperglycemia 04/01/2021   Chronic cough 12/30/2020   GERD (gastroesophageal reflux disease) 11/06/2019   Bilateral leg edema 10/16/2019   Pleuritic chest pain 10/16/2019   Hypertension 10/02/2019   Arthralgia 06/28/2018   Sinusitis 06/28/2018   Renal cyst 05/03/2018   Syncope 05/03/2018   Stress and adjustment reaction 05/03/2018   History of tobacco use 05/02/2018   Primary osteoarthritis of both hands 04/10/2018   Primary osteoarthritis of both knees 04/10/2018   Primary osteoarthritis of both feet 04/10/2018   Achilles tendinitis 04/02/2018   Screening for breast cancer 03/15/2018   Positive ANA (antinuclear antibody) 03/15/2018   Screening for colon cancer 03/15/2018   Rash 03/15/2018   Achilles tendon pain 11/13/2017   Cervical myelopathy with cervical radiculopathy (HCC) 11/13/2017   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   Allergic rhinitis 06/19/2017   Edema 12/24/2015   Carpal tunnel syndrome, right 12/16/2015   Hypercalcemia 11/11/2015   Pain in both upper extremities 11/11/2015   Drug reaction 10/28/2015   Muscle spasms of both lower extremities 10/28/2015   Nerve pain 10/28/2015   Chronic asthmatic bronchitis with acute exacerbation (HCC) 09/06/2015   Environmental and seasonal allergies 07/17/2014    PCP: Autry Legions,  Alveda Aures, MD  REFERRING PROVIDER: Ulysees Gander, DO  REFERRING DIAG: Chronic left shoulder pain; Chronic pain of left knee; Chronic bilateral low back pain with left-sided sciatica  THERAPY DIAG:  Chronic left shoulder pain  Muscle weakness (generalized)  Rationale for Evaluation and Treatment: Rehabilitation  ONSET DATE: Chronic for a "couple of months"   SUBJECTIVE SUBJECTIVE STATEMENT: Patient reports her left shoulder is about the same, she has her good days and bad days.   Eval: Patient reports one day she woke up and her left shoulder  was hurting and swollen. The pain has pretty much stayed the same since it started. The left shoulder and arm feel heavy and achy. Sometimes she can move it around but sometimes it feels locked and painful when moving it. She did have an injection earlier this week and it is a little better, but still having some pain but not as bad as when she saw the doctor. She does report the shoulder will crack and pop when she moves it. Pain occurs with all movements overhead, out to the side, and behind back. Her left side of the neck also bothers her. She reports that her whole left arm occasionally goes numb and the middle finger is always numb. She reports difficulty sleeping because of the left shoulder.   Patient also reports neuropathy in both feet and legs, swelling in her legs, a lot of pain in her knees and hips, achilles tendinoplasty on the left, and L4-5 infusion with chronic lower back pain that comes and goes.   PERTINENT HISTORY: See PMH above  PAIN:  Are you having pain? Yes:  NPRS scale: 6/10 currently, 8/10 at worst Pain location: Left shoulder Pain description: Heavy, achy, sore Aggravating factors: Moving the left shoulder Relieving factors: Medication, topical cream and patches, ice  PRECAUTIONS: None  PATIENT GOALS: Pain relief and improve use of left arm   OBJECTIVE:  Note: Objective measures were completed at Evaluation unless otherwise noted. PATIENT SURVEYS:  [name] 70.5%  12/13/2023: 65.9%    SENSATION: Patient reports sensation deficit of left middle finger, otherwise all WFL but she does not occasional numbness of the left arm  POSTURE: Rounded shoulder posture  CERVICAL ROM:   Active ROM A/PROM (deg) eval   11/20/2023   12/04/2023  Flexion 35    Extension 20    Right lateral flexion     Left lateral flexion     Right rotation 45 70 70  Left rotation 65 55 60   (Blank rows = not tested)  UPPER EXTREMITY ROM:   Active ROM Right eval Left eval  Left 10/30/2023 Left 11/07/2023 Left 11/20/2023 Left 12/04/2023 Left 12/13/2023  Shoulder flexion 140 120 135 135 140 140 140  Shoulder extension         Shoulder abduction 160 120 140      Shoulder adduction         Shoulder internal rotation T8 L1    T10   Shoulder external rotation 60 / T4 50 / C7       Elbow flexion         Elbow extension         Wrist flexion         Wrist extension         Wrist ulnar deviation         Wrist radial deviation         Wrist pronation         Wrist  supination         (Blank rows = not tested)  UPPER EXTREMITY MMT:  MMT Right eval Left eval Left 12/04/2023  Shoulder flexion 4+ 3+ 3+  Shoulder extension     Shoulder abduction 4+ 3+ 3+  Shoulder adduction     Shoulder internal rotation  4   Shoulder external rotation 4+ 4- 4-  Middle trapezius     Lower trapezius     Elbow flexion 5 4   Elbow extension 5 4   Wrist flexion     Wrist extension     Wrist ulnar deviation     Wrist radial deviation     Wrist pronation     Wrist supination     Grip strength (lbs) 49 25   (Blank rows = not tested)  SHOULDER SPECIAL TESTS: Not tested due to symptom irritability  JOINT MOBILITY TESTING:  Hypomobility of the left GHJ and with cervical CPAs  PALPATION:  Tenderness noted left upper trap, middle tap, rhomboid, infraspinatus, greater tubercle, bicep tendon                                                                                                                             TREATMENT  [facility] Adult PT Treatment:                                                DATE: 12/25/2023 UBE L1 x 4 min (fwd/bwd) to improve endurance and workload capacity Seated overhead pulley x 4 min for shoulder elevation STM for left upper trap, levator scap, rhomboid, cervical paraspinals Row with green 3 x 10 Supine suboccipital release with gentle manual traction Passive levator scap and upper trap stretch Seated cervical retraction 10 x 5 sec  Time spent  discussing surgical options for the left shoulder including rotator cuff repair, TSA, rTSA. Recovery and rehab options for each surgery.   PATIENT EDUCATION: Education details: HEP Person educated: Patient Education method: Explanation Education comprehension: verbalized understanding  HOME EXERCISE PROGRAM: Access Code: [email or web address]   ASSESSMENT: CLINICAL IMPRESSION: Patient tolerated therapy well with no adverse effects. Patient is scheduled to see an orthopedic surgeon for her left shoulder so spent time discussing surgical options and expectations. Therapy focused on reducing neck and shoulder muscle tension and postural control with good tolerance. No changes were made to her HEP this visit. Patient would benefit from continued skilled PT to progress mobility and strength in order to reduce pain and maximize functional ability.   Eval: Patient is a 68 y.o. female who was seen today for physical therapy evaluation and treatment for chronic left shoulder and neck pain with left arm and hand numbness. She did exhibit high irritability level of left shoulder symptoms with limitations in shoulder A/PROM and strength due to pain that are impacting her functional ability.  Her left shoulder symptoms seem multifactorial but most consistent with rotator cuff tendinopathy and likely left cervical radiculopathy. She also exhibits tenderness of left cervical and shoulder region and performed TPDN this visit for left upper trap with twitch response. She was previously provided HEP for left shoulder but also provided her with neck stretching and postural exercise this visit.  OBJECTIVE IMPAIRMENTS: decreased activity tolerance, decreased ROM, decreased strength, postural dysfunction, and pain.   ACTIVITY LIMITATIONS: carrying, lifting, sleeping, bathing, dressing, reach over head, and hygiene/grooming  PARTICIPATION LIMITATIONS: meal prep, cleaning, laundry, shopping, and community activity  PERSONAL  FACTORS: Fitness, Past/current experiences, and Time since onset of injury/illness/exacerbation are also affecting patient's functional outcome.    GOALS: Goals reviewed with patient? Yes  SHORT TERM GOALS: Target date: 11/15/2023  Patient will be I with initial HEP in order to progress with therapy. Baseline: HEP provided at eval 11/20/2023: independent with initial HEP Goal status: MET  2.  Patient will report left shoulder pain <= 6/10 in when using left arm order to reduce functional limitations Baseline: 8/10 11/20/2023: pain still 8/10 at worst 12/13/2023: continues to report severe pain Goal status: ONGOING  3.  Patient will demonstrate left cervical rotation >/= 60 deg in order to reduce neck tension and pain Baseline: 45 deg 11/20/2023: 55 deg 12/04/2023: 60 deg Goal status: MET  LONG TERM GOALS: Target date: 01/24/2024  Patient will be I with final HEP to maintain progress from PT. Baseline: HEP provided at eval 12/13/2023: progressing Goal status: ONGOING  2.  Patient will report QuickDASH </= 45% in order to indicate an improvement in their functional status Baseline: 70.5% 12/13/2023: 65.9% Goal status: ONGOING  3.  Patient will demonstrate improved range of motion of shoulder elevation >/= 140 deg in order to improve ability to perform dressing and grooming tasks Baseline: 120 deg 12/13/2023: 140 deg Goal status: MET  4.  Patient will demonstrate left shoulder strength >/= 4/5 MMT in order to improve activity tolerance and use of left arm with household tasks Baseline: see limitations above 12/13/2023: not assessed due to pain Goal status: ONGOING   PLAN: PT FREQUENCY: 1-2x/week  PT DURATION: 6 weeks  PLANNED INTERVENTIONS: 97164- PT Re-evaluation, 97110-Therapeutic exercises, 97530- Therapeutic activity, 97112- Neuromuscular re-education, 97535- Self Care, 16109- Manual therapy, 97116- Gait training, Patient/Family education, Taping, Dry Needling, Joint  mobilization, Joint manipulation, Spinal manipulation, Spinal mobilization, Cryotherapy, and Moist heat  PLAN FOR NEXT SESSION: Review HEP and progress PRN, manual/TPDN for left neck and shoulder region, left shoulder mobs and PROM, progress left shoulder AAROM->AROM and strengthening as tolerated, postural control; further assessment of cervical and neurodynamic testing   Leah Primus, PT, DPT, LAT, ATC 12/25/23  3:16 PM Phone: [phone] Fax: [phone]

## 2023-12-27 ENCOUNTER — Ambulatory Visit: Payer: Medicare Other | Admitting: Primary Care

## 2023-12-30 ENCOUNTER — Other Ambulatory Visit: Payer: Self-pay | Admitting: Internal Medicine

## 2024-01-02 ENCOUNTER — Telehealth: Payer: Self-pay | Admitting: Primary Care

## 2024-01-02 ENCOUNTER — Encounter: Payer: Self-pay | Admitting: Primary Care

## 2024-01-02 ENCOUNTER — Other Ambulatory Visit (HOSPITAL_COMMUNITY): Payer: Self-pay

## 2024-01-02 ENCOUNTER — Ambulatory Visit (INDEPENDENT_AMBULATORY_CARE_PROVIDER_SITE_OTHER): Admitting: Primary Care

## 2024-01-02 ENCOUNTER — Telehealth: Payer: Self-pay

## 2024-01-02 VITALS — BP 130/84 | HR 88 | Temp 97.6°F

## 2024-01-02 DIAGNOSIS — F17210 Nicotine dependence, cigarettes, uncomplicated: Secondary | ICD-10-CM

## 2024-01-02 DIAGNOSIS — F17200 Nicotine dependence, unspecified, uncomplicated: Secondary | ICD-10-CM

## 2024-01-02 DIAGNOSIS — J45909 Unspecified asthma, uncomplicated: Secondary | ICD-10-CM

## 2024-01-02 MED ORDER — LOSARTAN POTASSIUM 25 MG PO TABS: 25.0000 mg | ORAL_TABLET | Freq: Every day | ORAL | 0 refills | Status: DC

## 2024-01-02 MED ORDER — ALBUTEROL SULFATE (2.5 MG/3ML) 0.083% IN NEBU: 2.5000 mg | INHALATION_SOLUTION | Freq: Four times a day (QID) | RESPIRATORY_TRACT | 5 refills | Status: DC | PRN

## 2024-01-02 MED ORDER — ALBUTEROL SULFATE HFA 108 (90 BASE) MCG/ACT IN AERS: 2.0000 | INHALATION_SPRAY | Freq: Four times a day (QID) | RESPIRATORY_TRACT | 6 refills | Status: AC | PRN

## 2024-01-02 NOTE — Telephone Encounter (Signed)
 Nebulizer solutions are typically only covered under Medicare Part B for patient with Medicare.   ICS/LABA Breyna  10.3gm-$191.41 Brand Advair  HFA- $285.11 Breo Ellipta-$[phone]  Costs will not go down until patient meets their deductible.

## 2024-01-02 NOTE — Telephone Encounter (Signed)
 I need some help.  This is a Frances Nelson patient. She has a very high deductible Can you help me find the cheapest inhaler for her She has been having difficulty getting on schedule BD She tried ipratropium-albuterol  but did not tolerate due to cough   What ICS/LABA is covered under her plan Could we do PA for yupelri?

## 2024-01-02 NOTE — Progress Notes (Signed)
 @Patient  ID: Frances Nelson, female    DOB: 1956/03/05, 68 y.o.   MRN: [id number]  Chief Complaint  Patient presents with   Follow-up    Mucus in the morning - yellow    Referring provider: Roslyn Coombe, MD  HPI: 68 year old female, current every day smoker. PMH significant for HTN, allergic rhinitis, COPD with asthma, GERD, HLD, obesity, tobacco abuse.     Previous [facility] pulmonary encounter:  ROV 04/11/22 --follow-up visit pleasant 68 year old woman, active smoker (1.5 packs/day) with associated COPD as well as chronic cough.  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  Currently managed on Singulair , has albuterol  available to use, takes bout 2x a day.  We tried her on Spiriva  in the past but was not well covered by her insurance. She able to exert but does get SOB w stairs, longer walks. Some cough in the am, occasionally will wake up at night. Thick mucous, clear, no blood.   ROV 06/28/2023 --follow-up visit for 68 year old woman who is an active smoker (1.5 packs/day) with COPD, chronic cough due to this as well as GERD and chronic rhinitis.  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  We have been managing her on Singulair , Nasonex , Zyrtec, Prilosec twice daily.  She was not on any scheduled BD therapy due to cost and availability, was started on DuoNeb 4 times daily at her last visit in March 2024 but she never got this.  She has a lot of pain, has zoster affecting L face and scalp. Also a lot of leg pain. She uses albuterol  about 2x a day. She does have exertional SOB w stairs. She is smoking about 1 pk/day. She tried to get Chantix  but the co-pay was $600. She apparently has a medication deductible of $8500.   COPD with asthma (HCC) She has a medication deductible on getting her on any steady BD therapy has been very difficult.  Not clear to me that her DuoNeb was run through  Medicare part B.  I will try to reorder it.  If successful then we will use it 4 times daily on a schedule, continue albuterol  as needed.  Smoker Discussed tobacco use and cessation efforts with her today.  She has cut down to 1 pack daily.  Support her in this.     01/02/2024- Interim hx  Patient presents today for 6 month follow-up/ COPD with asthma Current smoker  Difficult to get patient on steady maintenance therapy due to high insurance deductible Ordered for Duoneb QID through medicare part B   Discussed the use of AI scribe software for clinical note transcription with the patient, who gave verbal consent to proceed.  History of Present Illness   Frances Nelson is a 68 year old female with COPD and asthma who presents for a six-month follow-up.  She has been diagnosed with COPD and asthma and has difficulty maintaining a regular inhaler regimen due to high deductible costs. She was prescribed Duoneb (ipratropium albuterol ) through Medicare Part B, but it caused uncontrollable coughing and breathlessness, leading to its discontinuation. She has used a nebulizer with Albuterol  without issues, but budesonide  in the past also caused similar coughing.  Her breathing is described as 'not terrible,' but she experiences significant shortness of breath, especially when walking due to leg pain. A recent fall injured her 'good leg,' exacerbating her mobility issues and shortness of  breath. She has been on various inhalers, including Ventolin  albuterol , which she is currently using with 44 inhalations left. She is concerned about the cost and coverage of her medications, as her deductible is $8,500, which is unaffordable for her.  She has a history of asthma as a child, which resolved but returned in adulthood. She has smoked in the past and currently smokes half a pack a day. She has tried various inhalers, including Advair , Flovent , Spiriva , and Breo, with varying degrees of tolerance and  effectiveness.  She reports a persistent cough rated at 4 out of 5, phlegm in her chest rated at 3 out of 5, occasional chest tightness rated at 2 out of 5, and significant breathlessness when walking up hills or stairs rated at 5 out of 5. Her activities at home are limited due to her condition, rated at 4 out of 5, but she feels confident leaving her home, rated at 1 out of 5. Her sleep is occasionally disturbed by coughing, rated at 2 out of 5, and her energy level is moderate, rated at 3 out of 5.      Allergies  Allergen Reactions   Symbicort  [Budesonide -Formoterol  Fumarate] Other (See Comments)    Patient reported dizziness, nausea, headaches and sore throat while using Symbicort  160. Reported on 09/03/17   Tramadol  Hives   Celebrex  [Celecoxib ]     Bleeding.     Chocolate Flavoring Agent (Non-Screening)    Ciprofloxacin Nausea And Vomiting    Headache, shaking.    Egg-Derived Products    Flavoring Agent (Non-Screening)     Unknown   Lisinopril  Cough    Pt off med for a week, some improvement   Lyrica [Pregabalin]     Made me out of it.    Penicillins     Patient preference   Wellbutrin [Bupropion]    Codeine Palpitations    Immunization History  Administered Date(s) Administered   PFIZER(Purple Top)SARS-COV-2 Vaccination 11/06/2019, 12/01/2019   PNEUMOCOCCAL CONJUGATE-20 04/09/2023   Tdap 03/15/2018    Past Medical History:  Diagnosis Date   Arthritis    Asthma    COPD (chronic obstructive pulmonary disease) (HCC)    GERD (gastroesophageal reflux disease)    Hypertension    Pneumonia     Tobacco History: Social History   Tobacco Use  Smoking Status Every Day   Current packs/day: 1.00   Average packs/day: 1 pack/day for 49.0 years (49.0 ttl pk-yrs)   Types: Cigarettes   Passive exposure: Never  Smokeless Tobacco Never  Tobacco Comments   1 pack a day   Ready to quit: Not Answered Counseling given: Not Answered Tobacco comments: 1 pack a  day   Outpatient Medications Prior to Visit  Medication Sig Dispense Refill   acetaminophen  (TYLENOL ) 650 MG CR tablet Take 1,300 mg by mouth as needed.     acidophilus (RISAQUAD) CAPS capsule Take 1 capsule by mouth in the morning.     Adapalene  (DIFFERIN ) 0.3 % gel Apply 1 Application topically at bedtime. Apply a pea sized amount to face and behind ears 3 nights weekly 45 g 2   alendronate  (FOSAMAX ) 70 MG tablet Take 1 tablet (70 mg total) by mouth every 7 (seven) days. Take with a full glass of water on an empty stomach. 12 tablet 3   B Complex Vitamins (VITAMIN-B COMPLEX PO) Take by mouth daily.     Calcium -Vitamins C & D (CALCIUM /C/D PO) Take 1 tablet by mouth in the morning.  celecoxib  (CELEBREX ) 100 MG capsule Take 1 capsule (100 mg total) by mouth 2 (two) times daily. 60 capsule 1   cetirizine (ZYRTEC) 10 MG tablet Take 10 mg by mouth in the morning.     Cholecalciferol (HM VITAMIN D3 PO) Take 5,000 Units by mouth in the morning.     clindamycin  (CLEOCIN  T) 1 % SWAB APPLY 1 APPLICATION TOPICALLY IN THE MORNING. WIPE ON FACE AND BEHIND EARS EVERY MORNING 60 each 2   clobetasol  cream (TEMOVATE ) 0.05 % Apply 1 Application topically 2 (two) times daily. Apply to affected areas 2 x daily for 2 weeks then stop. Use as needed (Patient taking differently: Apply 1 Application topically as needed. Apply to affected areas 2 x daily for 2 weeks then stop. Use as needed) 45 g 0   COLLAGEN PO Take by mouth. Collagen Peptides     cyclobenzaprine  (FLEXERIL ) 5 MG tablet Take 1 tablet (5 mg total) by mouth at bedtime. 30 tablet 0   doxycycline  (VIBRAMYCIN ) 50 MG capsule Take 1 capsule (50 mg total) by mouth daily. With food and plenty of fluid 30 capsule 3   DULoxetine  (CYMBALTA ) 30 MG capsule TAKE 1 CAPSULE IN THE MORNING AND 2 CAPSULES IN THE EVENING 270 capsule 4   ezetimibe  (ZETIA ) 10 MG tablet Take 1 tablet (10 mg total) by mouth daily. 90 tablet 3   gabapentin  (NEURONTIN ) 100 MG capsule Take 1  capsule (100 mg total) by mouth 3 (three) times daily. 90 capsule 5   Homeopathic Products (ZICAM ALLERGY RELIEF NA) Place into the nose.     hydrochlorothiazide  (HYDRODIURIL ) 12.5 MG tablet TAKE 1 TABLET BY MOUTH EVERY DAY (Patient taking differently: Take 12.5 mg by mouth as needed.) 90 tablet 2   LORazepam  (ATIVAN ) 0.5 MG tablet 1-2 tabs 30 - 60 min prior to MRI. Do not drive with this medicine. 4 tablet 0   losartan  (COZAAR ) 25 MG tablet TAKE 1 TABLET (25 MG TOTAL) BY MOUTH DAILY. 90 tablet 2   MELATONIN PO Take 5 mg by mouth at bedtime.     meloxicam  (MOBIC ) 15 MG tablet Take 0.5 tablets (7.5 mg total) by mouth daily. (Patient taking differently: Take 7.5 mg by mouth as needed.) 60 tablet 0   meloxicam  (MOBIC ) 15 MG tablet Take 1 tablet (15 mg total) by mouth daily. 30 tablet 0   methocarbamol  (ROBAXIN ) 750 MG tablet TAKE 1 TABLET (750 MG TOTAL) BY MOUTH EVERY 6 (SIX) HOURS AS NEEDED FOR MUSCLE SPASMS (NOT COVERED) 120 tablet 2   mometasone  (NASONEX ) 50 MCG/ACT nasal spray Place 2 sprays into the nose every evening.     montelukast  (SINGULAIR ) 10 MG tablet TAKE 1 TABLET BY MOUTH EVERYDAY AT BEDTIME 90 tablet 3   Multiple Vitamins-Minerals (MULTIVITAMIN WITH MINERALS) tablet Take 1 tablet by mouth in the morning.     omeprazole  (PRILOSEC) 40 MG capsule TAKE 1 CAPSULE (40 MG TOTAL) BY MOUTH IN THE MORNING AND AT BEDTIME. 180 capsule 3   phentermine  30 MG capsule Take 1 capsule (30 mg total) by mouth every morning. 30 capsule 5   topiramate  (TOPAMAX ) 50 MG tablet Take 1 tablet (50 mg total) by mouth 2 (two) times daily. 120 tablet 5   traMADol  (ULTRAM ) 50 MG tablet Take 1-2 tablets (50-100 mg total) by mouth every 6 (six) hours as needed. 30 tablet 0   ipratropium-albuterol  (DUONEB) 0.5-2.5 (3) MG/3ML SOLN Inhale 3 mLs into the lungs in the morning, at noon, in the evening, and at bedtime.  360 mL 3   lidocaine  (LIDODERM ) 5 % Place 1 patch onto the skin daily. Remove & Discard patch within 12 hours  or as directed by MD 30 patch 0   rosuvastatin  (CRESTOR ) 10 MG tablet Take 1 tablet (10 mg total) by mouth daily. 90 tablet 3   Facility-Administered Medications Prior to Visit  Medication Dose Route Frequency Provider Last Rate Last Admin   triamcinolone  acetonide (KENALOG ) 10 MG/ML injection 10 mg  10 mg Intradermal Once Dellar Fenton, DO       Review of Systems  Review of Systems  Constitutional: Negative.   HENT: Negative.    Respiratory:  Positive for cough.    Physical Exam  BP 130/84 (BP Location: Left Arm, Cuff Size: Normal)   Pulse 88   Temp 97.6 F (36.4 C) (Temporal)   SpO2 95%  Physical Exam Constitutional:      General: She is not in acute distress.    Appearance: Normal appearance. She is not ill-appearing.  HENT:     Head: Normocephalic and atraumatic.   Cardiovascular:     Rate and Rhythm: Normal rate and regular rhythm.  Pulmonary:     Effort: Pulmonary effort is normal.     Breath sounds: Normal breath sounds.   Neurological:     General: No focal deficit present.     Mental Status: She is alert and oriented to person, place, and time. Mental status is at baseline.   Psychiatric:        Mood and Affect: Mood normal.        Behavior: Behavior normal.        Thought Content: Thought content normal.        Judgment: Judgment normal.      Lab Results:  CBC    Component Value Date/Time   WBC 9.0 10/11/2023 1156   RBC 4.80 10/11/2023 1156   HGB 14.9 10/11/2023 1156   HCT 44.4 10/11/2023 1156   PLT 333.0 10/11/2023 1156   MCV 92.5 10/11/2023 1156   MCH 30.6 04/05/2023 1443   MCHC 33.5 10/11/2023 1156   RDW 13.3 10/11/2023 1156   LYMPHSABS 1.8 10/11/2023 1156   MONOABS 0.7 10/11/2023 1156   EOSABS 0.3 10/11/2023 1156   BASOSABS 0.1 10/11/2023 1156    BMET    Component Value Date/Time   NA 138 10/11/2023 1156   K 4.4 10/11/2023 1156   CL 99 10/11/2023 1156   CO2 33 (H) 10/11/2023 1156   GLUCOSE 97 10/11/2023 1156   BUN 16  10/11/2023 1156   CREATININE 0.66 10/11/2023 1156   CREATININE 0.69 04/05/2023 1443   CALCIUM  10.5 10/11/2023 1156   GFRNONAA >60 05/11/2022 1456   GFRNONAA >60 07/04/2018 1119   GFRAA >60 07/04/2018 1119    BNP No results found for: BNP  ProBNP    Component Value Date/Time   PROBNP 20.0 10/16/2019 1116    Imaging: MR CERVICAL SPINE WO CONTRAST Result Date: 12/18/2023 CLINICAL DATA:  Chronic neck pain, severe neck and left shoulder pain for 3 months. Weakness in left arm and numbness in left hand. EXAM: MRI CERVICAL SPINE WITHOUT CONTRAST TECHNIQUE: Multiplanar, multisequence MR imaging of the cervical spine was performed. No intravenous contrast was administered. COMPARISON:  Cervical spine radiograph 04/09/2020. FINDINGS: Alignment: Straightening of the normal cervical lordosis. Trace anterolisthesis of C7 on T1. Vertebrae: No fracture, evidence of discitis, or bone lesion. Cord: Normal signal and morphology. Posterior Fossa, vertebral arteries, paraspinal tissues: The posterior fossa is unremarkable.  Vertebral artery flow voids are visualized. Paraspinal soft tissues are unremarkable. Disc levels: C2-3: Small disc bulge. No significant spinal canal stenosis. Bilateral facet arthrosis. Mild foraminal stenosis on the left. C3-4: Small disc bulge without significant spinal canal stenosis. Bilateral facet arthrosis. No significant foraminal stenosis. C4-5: No significant spinal canal stenosis. No significant foraminal stenosis. C5-6: Small disc osteophyte complex eccentric to the left which indents the ventral thecal sac without contacting the spinal cord. Bilateral facet arthrosis. Mild left foraminal stenosis. C6-7: No significant spinal canal or foraminal stenosis. C7-T1: Trace anterolisthesis. Diffuse disc bulge which indents the ventral thecal sac without significant spinal canal stenosis. Bilateral facet arthrosis. No significant foraminal stenosis. IMPRESSION: Within the limitations of  motion artifact, no acute abnormality of the cervical spine. Mild degenerative changes as above. No high-grade spinal canal stenosis. Mild foraminal stenosis on the left at C2-3 and C5-6. Electronically Signed   By: Denny Flack M.D.   On: 12/18/2023 10:07   MR Shoulder Left W Contrast Result Date: 12/18/2023 CLINICAL DATA:  Severe neck and left shoulder pain for 3 months. EXAM: MRI OF THE LEFT SHOULDER WITH CONTRAST TECHNIQUE: Multiplanar, multisequence MR imaging of the the left shoulder radiographs 10/11/2023 with shoulder was performed following the administration of intra-articular contrast. CONTRAST:  See Injection Documentation. COMPARISON:  Left shoulder radiographs 10/11/2023 FINDINGS: Rotator cuff: There is a full-thickness tear of the mid and anterior supraspinatus tendon footprint in a region measuring up to 2.3 cm in AP dimension (sagittal series 9, image 16) with up to 2.2 cm tendon retraction (coronal series 6 images 8 through 12). There is moderate thinning/fraying of the deep fibers of the subscapularis tendon insertion, greatest within the superior 50% (axial series 5 images 8 through 10 and sagittal series 9, images 9 and 10). Muscles: Lack of sagittal T1 weighted images limits evaluation of the rotator cuff musculature. There appears to be mild supraspinatus muscle atrophy. Biceps long head: The intra-articular long head of the biceps tendon is intact. Acromioclavicular Joint: There are mild-to-moderate degenerative changes of the acromioclavicular joint including joint space narrowing, subchondral marrow edema, and peripheral osteophytosis. Type II acromion. Moderate distal lateral broad-based subacromial spurring the (coronal series 8 images 6 and 7). Glenohumeral Joint: Mild-to-moderate glenohumeral cartilage thinning. Labrum:  No glenoid labral tear is seen. Bones:  No acute fracture. Other: None. IMPRESSION: 1. 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 Mild supraspinatus muscle atrophy. 2. Moderate thinning/fraying of the deep fibers of the subscapularis tendon insertion, greatest within the superior 50% of the subscapularis tendon insertion. 3. Mild-to-moderate degenerative changes of the acromioclavicular joint. Moderate distal lateral broad-based subacromial spurring. 4. Mild-to-moderate glenohumeral cartilage thinning. Electronically Signed   By: Bertina Broccoli M.D.   On: 12/18/2023 09:48   US  LIMITED JOINT SPACE STRUCTURES UP LEFT Result Date: 12/11/2023 Procedure: Real-time Ultrasound Guided gadolinium contrast injection of left glenohumeral joint Device: Samsung [id number] Verbal informed consent obtained. Time-out conducted. Noted no overlying erythema, induration, or other signs of local infection. Skin prepped in a sterile fashion. Local anesthesia: Topical Ethyl chloride. With sterile technique and under real time ultrasound guidance: 22-gauge spinal needle advanced into the joint from a posterior approach, I injected 1 cc kenalog  40, 2 cc lidocaine , 2 cc bupivacaine, syringe switched and 0.1 cc gadolinium injected, syringe again switched and 10 cc sterile saline used to fully distend the joint. Joint visualized  and capsule seen distending confirming intra-articular placement of contrast material and medication. Completed without difficulty Advised to call if fevers/chills, erythema, induration, drainage, or persistent bleeding. Images permanently stored in PACS Impression: Technically successful ultrasound guided gadolinium contrast injection for MR arthrography.  Please see separate MR arthrogram report.     Assessment & Plan:   1. Smoker (Primary) - Ambulatory Referral for Lung Cancer Scre  Assessment and Plan    COPD with asthma Moderate obstructive airway disease with asthma component. Symptoms include cough, dyspnea, and sputum production. Intolerance to  certain inhalers due to cough and throat irritation. Current Ventolin  albuterol  inhaler is low, with financial constraints affecting medication access. Previous inhalers include Advair , Spiriva , Flovent , and Breo, with variable tolerance and efficacy. Reports leg pain affecting ambulation, contributing to dyspnea. - Intolerant to Duoneb nebulizer due to cough - Continue albuterol  nebulizer solution three to four times daily at home. - Use rescue inhaler only when outside the home. - Coordinate with pharmacy team to explore medication options and costs. - Discuss smoking cessation strategies and recommend Antony Baumgartner book 'Easy Way to Quit Smoking Without Willpower'. - Encourage selection of a quit date for smoking cessation. - Refer for annual low-dose CT scan for lung cancer screening due to smoking history.  ICS/LABA Breyna  10.3gm-$191.41 Brand Advair  HFA- $285.11 Breo Ellipta-$364.05  Nicotine dependence Currently smokes half a pack per day, reduced from previous amount. Smoking cessation is challenging due to son's smoking habits and financial constraints. She has attempted quitting before and is aware of associated health risks. - Encourage continued reduction in smoking and discuss setting a quit date. - Recommend Antony Baumgartner book 'Easy Way to Quit Smoking Without Willpower'. - Discuss strategies to reduce smoking exposure at home, including smoking outside.  HTN Stable; BP 130/84 - Providing patient refill Cozaar , PCP to provide further refills   Antonio Baumgarten, NP 01/02/2024

## 2024-01-02 NOTE — Patient Instructions (Addendum)
-  COPD WITH ASTHMA: COPD with asthma is a chronic lung condition that causes breathing difficulties due to obstructed airways and inflammation. We will switch you to a Combivent inhaler as an alternative to the Duoneb nebulizer, and you should continue using the albuterol  nebulizer solution three to four times daily at home. Use your rescue inhaler only when you are outside the home. We will work with the pharmacy team to explore medication options and costs. Additionally, we discussed smoking cessation strategies and recommended Antony Baumgartner book 'Easy Way to Quit Smoking Without Willpower'. Please select a quit date for smoking cessation. You are also referred for an annual low-dose CT scan for lung cancer screening due to your smoking history.  -NICOTINE DEPENDENCE: Nicotine dependence is an addiction to tobacco products. You currently smoke half a pack per day, which is a reduction from before. We encourage you to continue reducing your smoking and to set a quit date. We recommend reading Antony Baumgartner book 'Easy Way to Quit Smoking Without Willpower' and discussed strategies to reduce smoking exposure at home, such as smoking outside.  INSTRUCTIONS: We will follow-up with pharmacy team to discuss medication options and costs. Schedule your annual low-dose CT scan for lung cancer screening. Select a quit date for smoking cessation and consider reading Antony Baumgartner book 'Easy Way to Quit Smoking Without Willpower'. Continue using your albuterol  nebulizer solution three to four times daily at home and use your rescue inhaler only when outside the home.  Referral: Lung cancer screening program   Rx Albuterol  Cozaar   Follow-up 6 months with Dr. Baldwin Levee or sooner if need

## 2024-01-02 NOTE — Telephone Encounter (Signed)
 Please let patient know cheapest inhaler is Breyna  (similar to Advair  or Breo), her cost would be $191.41 until deductible is met  If symptoms are management continue albuterol  q6 hours for shortness of breath/wheezing

## 2024-01-03 ENCOUNTER — Encounter: Admitting: Physical Therapy

## 2024-01-03 ENCOUNTER — Ambulatory Visit (INDEPENDENT_AMBULATORY_CARE_PROVIDER_SITE_OTHER): Admitting: Orthopedic Surgery

## 2024-01-03 DIAGNOSIS — M75122 Complete rotator cuff tear or rupture of left shoulder, not specified as traumatic: Secondary | ICD-10-CM | POA: Diagnosis not present

## 2024-01-03 NOTE — Telephone Encounter (Signed)
 I called and spoke to pt. Pt informed of Beth's note and verbalized understanding. NFN

## 2024-01-05 ENCOUNTER — Encounter: Payer: Self-pay | Admitting: Orthopedic Surgery

## 2024-01-05 NOTE — Progress Notes (Signed)
 Office Visit Note   Patient: Frances Nelson           Date of Birth: January 15, 1956           MRN: [id number] Visit Date: 01/03/2024 Requested by: Leonce Katz, DO [address] [place],  [place] [address] PCP: Norleen Lynwood ORN, MD  Subjective: Chief Complaint  Patient presents with   Neck - Pain   Left Shoulder - Pain    HPI: Frances Nelson is a 68 y.o. female who presents to the office reporting left shoulder pain of 4 months duration.  Patient is right-hand dominant.  Pain wakes her from sleep at night.  Pain is constant.  She reports decreased range of motion.  Also describes a radicular component of pain in that left shoulder arm and hand.  Also describes neck pain.  She has tried physical therapy and injections in the shoulder without relief.  No prior left shoulder surgery.  Has taken Neurontin  Tylenol  and duloxetine  without much relief.  No history of cardiac disease or diabetes.  MRI scan of the cervical spine done in June demonstrates mild degenerative changes with no high-grade spinal canal stenosis.  MRI of the left shoulder demonstrates full-thickness tear of the supraspinatus tendon with about 2 cm of retraction and mild muscle atrophy of the supraspinatus muscle belly.  There is some moderate thinning of the deep fibers of the subscap.  Mild to moderate glenohumeral joint cartilage thinning..                ROS: All systems reviewed are negative as they relate to the chief complaint within the history of present illness.  Patient denies fevers or chills.  Assessment & Plan: Visit Diagnoses:  1. Complete tear of left rotator cuff, unspecified whether traumatic     Plan: Impression is rotator cuff tear and possible occult arthritis in the glenohumeral joint.  She is having some biceps tendon symptoms as well as rotator cuff symptoms.  I think in addition to her shoulder symptoms there is a component of radiculopathy even though the cervical spine MRI was unrevealing.   That was a motion degraded study.  She has some coarseness on exam with passive range of motion on that left-hand side but no evidence of frozen shoulder.  Plan at this time is left shoulder arthroscopy with rotator cuff tear repair and biceps tenodesis.  The risk and benefits of surgical intervention are discussed with the patient including not limited to infection nerve vessel damage incomplete pain relief as well as incomplete restoration of function.  Retearing of the rotator cuff tear in this patient who is 68 years old is also a distinct possibility.  Nonetheless I think this is her best first option to try to get some relief from shoulder pain.  All questions answered.  Would like to use CPM machine for at least a week after surgery.  Follow-Up Instructions: No follow-ups on file.   Orders:  No orders of the defined types were placed in this encounter.  No orders of the defined types were placed in this encounter.     Procedures: No procedures performed   Clinical Data: No additional findings.  Objective: Vital Signs: There were no vitals taken for this visit.  Physical Exam:  Constitutional: Patient appears well-developed HEENT:  Head: Normocephalic Eyes:EOM are normal Neck: Normal range of motion Cardiovascular: Normal rate Pulmonary/chest: Effort normal Neurologic: Patient is alert Skin: Skin is warm Psychiatric: Patient has normal mood and affect  Ortho Exam: Ortho exam demonstrates bilateral shoulder range of motion of 50/95/165.  She has 5 out of 5 subscap strength bilaterally but external rotation strength on the left is 5-/5 compared to 5+ out of 5 on the right.  There are some coarseness in the on the left-hand side with internal/external rotation of the shoulder.  No discrete AC joint tenderness is present on that left-hand side.  Patient has 5 out of 5 grip EPL FPL interosseous wrist lection extension bicep triceps and deltoid strength.  Positive O'Brien's testing  on the left positive bicipital groove tenderness on the left.  This is not present on the right.  Specialty Comments:  No specialty comments available.  Imaging: No results found.   PMFS History: Patient Active Problem List   Diagnosis Date Noted   Obesity 10/13/2023   Shingles outbreak 05/29/2023   Microhematuria 04/09/2023   Bilateral shoulder pain 04/09/2023   Urethritis 10/05/2022   External otitis of left ear 10/05/2022   Spondylolisthesis of lumbar region 05/18/2022   Smoker 04/08/2022   Deviated nasal septum 04/08/2022   Bilateral pain of leg and foot 04/08/2022   Follicular acne 04/06/2022   Baker cyst, left 04/06/2022   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   Depression 10/04/2021   Left knee pain 10/04/2021   Chronic pain 04/09/2021   Pelvic pain 04/09/2021   HLD (hyperlipidemia) 04/09/2021   Vitamin D  deficiency 04/09/2021   Estrogen deficiency 04/09/2021   Hyperglycemia 04/01/2021   Chronic cough 12/30/2020   GERD (gastroesophageal reflux disease) 11/06/2019   Bilateral leg edema 10/16/2019   Pleuritic chest pain 10/16/2019   Hypertension 10/02/2019   Arthralgia 06/28/2018   Sinusitis 06/28/2018   Renal cyst 05/03/2018   Syncope 05/03/2018   Stress and adjustment reaction 05/03/2018   History of tobacco use 05/02/2018   Primary osteoarthritis of both hands 04/10/2018   Primary osteoarthritis of both knees 04/10/2018   Primary osteoarthritis of both feet 04/10/2018   Achilles tendinitis 04/02/2018   Screening for breast cancer 03/15/2018   Positive ANA (antinuclear antibody) 03/15/2018   Screening for colon cancer 03/15/2018   Rash 03/15/2018   Achilles tendon pain 11/13/2017   Cervical myelopathy with cervical radiculopathy (HCC) 11/13/2017   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   Allergic rhinitis 06/19/2017   Edema 12/24/2015   Carpal tunnel syndrome, right 12/16/2015   Hypercalcemia 11/11/2015   Pain in both  upper extremities 11/11/2015   Drug reaction 10/28/2015   Muscle spasms of both lower extremities 10/28/2015   Nerve pain 10/28/2015   Chronic asthmatic bronchitis with acute exacerbation (HCC) 09/06/2015   Environmental and seasonal allergies 07/17/2014   Past Medical History:  Diagnosis Date   Arthritis    Asthma    COPD (chronic obstructive pulmonary disease) (HCC)    GERD (gastroesophageal reflux disease)    Hypertension    Pneumonia     Family History  Problem Relation Age of Onset   Angina Father    Parkinson's disease Father    Leukemia Sister    Kidney disease Sister    Kidney disease Brother    Brain cancer Brother        glioblastoma    Cancer Brother    Allergies Son    Asthma Son    Allergies Son     Past Surgical History:  Procedure Laterality Date   ABDOMINAL HYSTERECTOMY     APPENDECTOMY     HERNIA REPAIR     Umbilical x  3   KNEE ARTHROSCOPY W/ MENISCAL REPAIR Left 12/2022   TONSILLECTOMY     TRANSFORAMINAL LUMBAR INTERBODY FUSION (TLIF) WITH PEDICLE SCREW FIXATION 1 LEVEL N/A 05/18/2022   Procedure: Lumbar Four-Five Open Laminectomy/Transforaminal Lumbar Interbody Fusion/Posterolateral fusion;  Surgeon: Cheryle Debby LABOR, MD;  Location: [facility] OR;  Service: Neurosurgery;  Laterality: N/A;   Social History   Occupational History   Occupation: RETIRED  Tobacco Use   Smoking status: Every Day    Current packs/day: 1.00    Average packs/day: 1 pack/day for 49.0 years (49.0 ttl pk-yrs)    Types: Cigarettes    Passive exposure: Never   Smokeless tobacco: Never   Tobacco comments:    1 pack a day  Vaping Use   Vaping status: Never Used  Substance and Sexual Activity   Alcohol use: No   Drug use: Never   Sexual activity: Not on file

## 2024-01-08 ENCOUNTER — Ambulatory Visit (INDEPENDENT_AMBULATORY_CARE_PROVIDER_SITE_OTHER): Admitting: Physical Therapy

## 2024-01-08 ENCOUNTER — Other Ambulatory Visit: Payer: Self-pay

## 2024-01-08 ENCOUNTER — Encounter: Payer: Self-pay | Admitting: Physical Therapy

## 2024-01-08 DIAGNOSIS — M6281 Muscle weakness (generalized): Secondary | ICD-10-CM | POA: Diagnosis not present

## 2024-01-08 DIAGNOSIS — G8929 Other chronic pain: Secondary | ICD-10-CM

## 2024-01-08 DIAGNOSIS — M25512 Pain in left shoulder: Secondary | ICD-10-CM

## 2024-01-08 NOTE — Therapy (Signed)
 OUTPATIENT PHYSICAL THERAPY TREATMENT  DISCHARGE    Patient Name: Frances Nelson MRN: [id number] DOB:1955-12-22, 68 y.o., female Today's Date: 01/08/2024   END OF SESSION:  PT End of Session - 01/08/24 1026     Visit Number 17    Number of Visits 21    Date for PT Re-Evaluation 01/24/24    Authorization Type MCR    Progress Note Due on Visit 20    PT Start Time 1021    PT Stop Time 1059    PT Time Calculation (min) 38 min    Activity Tolerance Patient tolerated treatment well    Behavior During Therapy WFL for tasks assessed/performed                          Past Medical History:  Diagnosis Date   Arthritis    Asthma    COPD (chronic obstructive pulmonary disease) (HCC)    GERD (gastroesophageal reflux disease)    Hypertension    Pneumonia    Past Surgical History:  Procedure Laterality Date   ABDOMINAL HYSTERECTOMY     APPENDECTOMY     HERNIA REPAIR     Umbilical x 3   KNEE ARTHROSCOPY W/ MENISCAL REPAIR Left 12/2022   TONSILLECTOMY     TRANSFORAMINAL LUMBAR INTERBODY FUSION (TLIF) WITH PEDICLE SCREW FIXATION 1 LEVEL N/A 05/18/2022   Procedure: Lumbar Four-Five Open Laminectomy/Transforaminal Lumbar Interbody Fusion/Posterolateral fusion;  Surgeon: Cheryle Debby LABOR, MD;  Location: [facility] OR;  Service: Neurosurgery;  Laterality: N/A;   Patient Active Problem List   Diagnosis Date Noted   Obesity 10/13/2023   Shingles outbreak 05/29/2023   Microhematuria 04/09/2023   Bilateral shoulder pain 04/09/2023   Urethritis 10/05/2022   External otitis of left ear 10/05/2022   Spondylolisthesis of lumbar region 05/18/2022   Smoker 04/08/2022   Deviated nasal septum 04/08/2022   Bilateral pain of leg and foot 04/08/2022   Follicular acne 04/06/2022   Baker cyst, left 04/06/2022   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   Depression 10/04/2021   Left knee pain 10/04/2021   Chronic pain 04/09/2021   Pelvic pain  04/09/2021   HLD (hyperlipidemia) 04/09/2021   Vitamin D  deficiency 04/09/2021   Estrogen deficiency 04/09/2021   Hyperglycemia 04/01/2021   Chronic cough 12/30/2020   GERD (gastroesophageal reflux disease) 11/06/2019   Bilateral leg edema 10/16/2019   Pleuritic chest pain 10/16/2019   Hypertension 10/02/2019   Arthralgia 06/28/2018   Sinusitis 06/28/2018   Renal cyst 05/03/2018   Syncope 05/03/2018   Stress and adjustment reaction 05/03/2018   History of tobacco use 05/02/2018   Primary osteoarthritis of both hands 04/10/2018   Primary osteoarthritis of both knees 04/10/2018   Primary osteoarthritis of both feet 04/10/2018   Achilles tendinitis 04/02/2018   Screening for breast cancer 03/15/2018   Positive ANA (antinuclear antibody) 03/15/2018   Screening for colon cancer 03/15/2018   Rash 03/15/2018   Achilles tendon pain 11/13/2017   Cervical myelopathy with cervical radiculopathy (HCC) 11/13/2017   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   Allergic rhinitis 06/19/2017   Edema 12/24/2015   Carpal tunnel syndrome, right 12/16/2015   Hypercalcemia 11/11/2015   Pain in both upper extremities 11/11/2015   Drug reaction 10/28/2015   Muscle spasms of both lower extremities 10/28/2015   Nerve pain 10/28/2015   Chronic asthmatic bronchitis with acute exacerbation (HCC) 09/06/2015   Environmental and seasonal allergies 07/17/2014    PCP: Norleen,  Lynwood ORN, MD  REFERRING PROVIDER: Leonce Katz, DO  REFERRING DIAG: Chronic left shoulder pain; Chronic pain of left knee; Chronic bilateral low back pain with left-sided sciatica  THERAPY DIAG:  Chronic left shoulder pain  Muscle weakness (generalized)  Rationale for Evaluation and Treatment: Rehabilitation  ONSET DATE: Chronic for a couple of months   SUBJECTIVE SUBJECTIVE STATEMENT: Patient reports she did fall in the bathtub since her last visit and she also saw the surgeon who recommended a rotator cuff repair, so she is  going to call today to have that scheduled.  Eval: Patient reports one day she woke up and her left shoulder was hurting and swollen. The pain has pretty much stayed the same since it started. The left shoulder and arm feel heavy and achy. Sometimes she can move it around but sometimes it feels locked and painful when moving it. She did have an injection earlier this week and it is a little better, but still having some pain but not as bad as when she saw the doctor. She does report the shoulder will crack and pop when she moves it. Pain occurs with all movements overhead, out to the side, and behind back. Her left side of the neck also bothers her. She reports that her whole left arm occasionally goes numb and the middle finger is always numb. She reports difficulty sleeping because of the left shoulder.   Patient also reports neuropathy in both feet and legs, swelling in her legs, a lot of pain in her knees and hips, achilles tendinoplasty on the left, and L4-5 infusion with chronic lower back pain that comes and goes.   PERTINENT HISTORY: See PMH above  PAIN:  Are you having pain? Yes:  NPRS scale: 6/10 currently, 8/10 at worst Pain location: Left shoulder Pain description: Heavy, achy, sore Aggravating factors: Moving the left shoulder Relieving factors: Medication, topical cream and patches, ice  PRECAUTIONS: None  PATIENT GOALS: Pain relief and improve use of left arm   OBJECTIVE:  Note: Objective measures were completed at Evaluation unless otherwise noted. PATIENT SURVEYS:  Junie Palin 70.5%  12/13/2023: 65.9%  01/08/2024: 68.2%    SENSATION: Patient reports sensation deficit of left middle finger, otherwise all WFL but she does not occasional numbness of the left arm  POSTURE: Rounded shoulder posture  CERVICAL ROM:   Active ROM A/PROM (deg) eval   11/20/2023   12/04/2023  Flexion 35    Extension 20    Right lateral flexion     Left lateral flexion     Right rotation  45 70 70  Left rotation 65 55 60   (Blank rows = not tested)  UPPER EXTREMITY ROM:   Active ROM Right eval Left eval Left 10/30/2023 Left 11/07/2023 Left 11/20/2023 Left 12/04/2023 Left 12/13/2023  Shoulder flexion 140 120 135 135 140 140 140  Shoulder extension         Shoulder abduction 160 120 140      Shoulder adduction         Shoulder internal rotation T8 L1    T10   Shoulder external rotation 60 / T4 50 / C7       Elbow flexion         Elbow extension         Wrist flexion         Wrist extension         Wrist ulnar deviation         Wrist radial  deviation         Wrist pronation         Wrist supination         (Blank rows = not tested)  UPPER EXTREMITY MMT:  MMT Right eval Left eval Left 12/04/2023 Left 01/08/2024  Shoulder flexion 4+ 3+ 3+ 3+  Shoulder extension      Shoulder abduction 4+ 3+ 3+   Shoulder adduction      Shoulder internal rotation  4    Shoulder external rotation 4+ 4- 4- 4-  Middle trapezius      Lower trapezius      Elbow flexion 5 4    Elbow extension 5 4    Wrist flexion      Wrist extension      Wrist ulnar deviation      Wrist radial deviation      Wrist pronation      Wrist supination      Grip strength (lbs) 49 25    (Blank rows = not tested)  SHOULDER SPECIAL TESTS: Not tested due to symptom irritability  JOINT MOBILITY TESTING:  Hypomobility of the left GHJ and with cervical CPAs  PALPATION:  Tenderness noted left upper trap, middle tap, rhomboid, infraspinatus, greater tubercle, bicep tendon                                                                                                                             TREATMENT  [facility] Adult PT Treatment:                                                DATE: 01/08/2024 UBE L1 x 5 min (fwd/bwd) to improve endurance and workload capacity Seated overhead pulley x 5 min for shoulder elevation Row with green 3 x 10 Seated cervical retraction with finger assist on chin 2 x 10 x 5  sec  Time spent discussing recovery and therapy following rotator cuff repair. Reviewing current HEP until she has shoulder surgery to maintain mobility and muscle function.   PATIENT EDUCATION: Education details: POC discharge, HEP Person educated: Patient Education method: Explanation Education comprehension: verbalized understanding  HOME EXERCISE PROGRAM: Access Code: [email or web address]   ASSESSMENT: CLINICAL IMPRESSION: Patient tolerated therapy well with no adverse effects. She continues to report left shoulder pain with activity and demonstrates limitations in her motion and strength. She plans on having left rotator cuff repair so will discharge from PT.   Eval: Patient is a 68 y.o. female who was seen today for physical therapy evaluation and treatment for chronic left shoulder and neck pain with left arm and hand numbness. She did exhibit high irritability level of left shoulder symptoms with limitations in shoulder A/PROM and strength due to pain that are impacting her functional ability. Her left shoulder symptoms seem multifactorial but most consistent  with rotator cuff tendinopathy and likely left cervical radiculopathy. She also exhibits tenderness of left cervical and shoulder region and performed TPDN this visit for left upper trap with twitch response. She was previously provided HEP for left shoulder but also provided her with neck stretching and postural exercise this visit.  OBJECTIVE IMPAIRMENTS: decreased activity tolerance, decreased ROM, decreased strength, postural dysfunction, and pain.   ACTIVITY LIMITATIONS: carrying, lifting, sleeping, bathing, dressing, reach over head, and hygiene/grooming  PARTICIPATION LIMITATIONS: meal prep, cleaning, laundry, shopping, and community activity  PERSONAL FACTORS: Fitness, Past/current experiences, and Time since onset of injury/illness/exacerbation are also affecting patient's functional outcome.    GOALS: Goals reviewed with  patient? Yes  SHORT TERM GOALS: Target date: 11/15/2023  Patient will be I with initial HEP in order to progress with therapy. Baseline: HEP provided at eval 11/20/2023: independent with initial HEP Goal status: MET  2.  Patient will report left shoulder pain <= 6/10 in when using left arm order to reduce functional limitations Baseline: 8/10 11/20/2023: pain still 8/10 at worst 12/13/2023: continues to report severe pain 01/08/2024: continues to report severe pain Goal status: NOT MET  3.  Patient will demonstrate left cervical rotation >/= 60 deg in order to reduce neck tension and pain Baseline: 45 deg 11/20/2023: 55 deg 12/04/2023: 60 deg Goal status: MET  LONG TERM GOALS: Target date: 01/24/2024  Patient will be I with final HEP to maintain progress from PT. Baseline: HEP provided at eval 12/13/2023: progressing 01/08/2024: independent Goal status: MET  2.  Patient will report QuickDASH </= 45% in order to indicate an improvement in their functional status Baseline: 70.5% 12/13/2023: 65.9% 01/08/2024: 68.2% Goal status: NOT MET  3.  Patient will demonstrate improved range of motion of shoulder elevation >/= 140 deg in order to improve ability to perform dressing and grooming tasks Baseline: 120 deg 12/13/2023: 140 deg Goal status: MET  4.  Patient will demonstrate left shoulder strength >/= 4/5 MMT in order to improve activity tolerance and use of left arm with household tasks Baseline: see limitations above 12/13/2023: not assessed due to pain 01/08/2024: continues to demonstrate strength deficits Goal status: NOT MET   PLAN: PT FREQUENCY: 1-2x/week  PT DURATION: 6 weeks  PLANNED INTERVENTIONS: 97164- PT Re-evaluation, 97110-Therapeutic exercises, 97530- Therapeutic activity, 97112- Neuromuscular re-education, 97535- Self Care, 02859- Manual therapy, 97116- Gait training, Patient/Family education, Taping, Dry Needling, Joint mobilization, Joint manipulation, Spinal  manipulation, Spinal mobilization, Cryotherapy, and Moist heat  PLAN FOR NEXT SESSION: Review HEP and progress PRN, manual/TPDN for left neck and shoulder region, left shoulder mobs and PROM, progress left shoulder AAROM->AROM and strengthening as tolerated, postural control; further assessment of cervical and neurodynamic testing   Elaine Daring, PT, DPT, LAT, ATC 01/08/24  11:32 AM Phone: [phone] Fax: [phone]      PHYSICAL THERAPY DISCHARGE SUMMARY  Visits from Start of Care: 17  Current functional level related to goals / functional outcomes: See above   Remaining deficits: See above   Education / Equipment: See above   Patient agrees to discharge. Patient goals were not met. Patient is being discharged due to lack of progress. (Getting surgery)

## 2024-01-09 ENCOUNTER — Telehealth: Payer: Self-pay | Admitting: Acute Care

## 2024-01-09 DIAGNOSIS — Z122 Encounter for screening for malignant neoplasm of respiratory organs: Secondary | ICD-10-CM

## 2024-01-09 DIAGNOSIS — Z87891 Personal history of nicotine dependence: Secondary | ICD-10-CM

## 2024-01-09 DIAGNOSIS — F17210 Nicotine dependence, cigarettes, uncomplicated: Secondary | ICD-10-CM

## 2024-01-09 NOTE — Telephone Encounter (Signed)
 Lung Cancer Screening Narrative/Criteria Questionnaire (Cigarette Smokers Only- No Cigars/Pipes/vapes)   Frances Nelson   SDMV:01/23/2024 10:00 [name]        1956/05/17   LDCT: 01/24/2024 10:40 GI    68 y.o.   Phone: [phone]  Lung Screening Narrative (confirm age 87-77 yrs Medicare / 50-80 yrs Private pay insurance)   [employer]   Referring Provider:Beth Hope NP   This screening involves an initial phone call with a team member from our program. It is called a shared decision making visit. The initial meeting is required by  insurance and Medicare to make sure you understand the program. This appointment takes about 15-20 minutes to complete. You will complete the screening scan at your scheduled date/time.  This scan takes about 5-10 minutes to complete. You can eat and drink normally before and after the scan.  Criteria questions for Lung Cancer Screening:   Are you a current or former smoker? Current Age began smoking: 68yo   If you are a former smoker, what year did you quit smoking? Patient quit several times totaling about 10 years but began again each time(within 15 yrs)   To calculate your smoking history, I need an accurate estimate of how many packs of cigarettes you smoked per day and for how many years. (Not just the number of PPD you are now smoking)   Years smoking 41 x Packs per day 1 = Pack years 41   (at least 20 pack yrs)   (Make sure they understand that we need to know how much they have smoked in the past, not just the number of PPD they are smoking now)  Do you have a personal history of cancer?  No    Do you have a family history of cancer? Yes  (cancer type and and relative) sister - lukemia, brother - glioblastoma  Are you coughing up blood?  No  Have you had unexplained weight loss of 15 lbs or more in the last 6 months? No  It looks like you meet all criteria.  When would be a good time for us  to schedule you for  this screening?   Additional information: N/A

## 2024-01-11 ENCOUNTER — Other Ambulatory Visit: Payer: Self-pay | Admitting: Sports Medicine

## 2024-01-15 ENCOUNTER — Other Ambulatory Visit: Payer: Self-pay | Admitting: Sports Medicine

## 2024-01-18 ENCOUNTER — Other Ambulatory Visit: Payer: Self-pay | Admitting: Primary Care

## 2024-01-23 ENCOUNTER — Ambulatory Visit: Admitting: *Deleted

## 2024-01-23 ENCOUNTER — Encounter: Payer: Self-pay | Admitting: *Deleted

## 2024-01-23 DIAGNOSIS — F17210 Nicotine dependence, cigarettes, uncomplicated: Secondary | ICD-10-CM

## 2024-01-23 NOTE — Patient Instructions (Signed)

## 2024-01-23 NOTE — Telephone Encounter (Signed)
 Ok done erx

## 2024-01-23 NOTE — Telephone Encounter (Signed)
 Last note says to route to PCP for further refills on losartan 

## 2024-01-23 NOTE — Progress Notes (Signed)
 Virtual Visit via Telephone Note  I connected with Frances Nelson on 01/23/24 at 10:00 AM EDT by telephone and verified that I am speaking with the correct person using two identifiers.  Location: Patient: Frances Nelson Provider: Laneta Speaks, RN   I discussed the limitations, risks, security and privacy concerns of performing an evaluation and management service by telephone and the availability of in person appointments. I also discussed with the patient that there may be a patient responsible charge related to this service. The patient expressed understanding and agreed to proceed.   Shared Decision Making Visit Lung Cancer Screening Program [phone])   Eligibility: Age 68 y.o. Pack Years Smoking History Calculation 41 (# packs/per year x # years smoked) Recent History of coughing up blood  no Unexplained weight loss? no ( >Than 15 pounds within the last 6 months ) Prior History Lung / other cancer no (Diagnosis within the last 5 years already requiring surveillance chest CT Scans). Smoking Status Current Smoker Former Smokers: Years since quit: n/a  Quit Date: n/a  Visit Components: Discussion included one or more decision making aids. yes Discussion included risk/benefits of screening. yes Discussion included potential follow up diagnostic testing for abnormal scans. yes Discussion included meaning and risk of over diagnosis. yes Discussion included meaning and risk of False Positives. yes Discussion included meaning of total radiation exposure. yes  Counseling Included: Importance of adherence to annual lung cancer LDCT screening. yes Impact of comorbidities on ability to participate in the program. yes Ability and willingness to under diagnostic treatment. yes  Smoking Cessation Counseling: Current Smokers:  Discussed importance of smoking cessation. yes Information about tobacco cessation classes and interventions provided to patient. yes Patient provided  with ticket for LDCT Scan. no Symptomatic Patient. no  Counseling(Intermediate counseling: > three minutes) 99406 Diagnosis Code: Tobacco Use Z72.0 Asymptomatic Patient yes  Counseling (Intermediate counseling: > three minutes counseling) [id number] Former Smokers:  Discussed the importance of maintaining cigarette abstinence. yes Diagnosis Code: Personal History of Nicotine Dependence. S12.108 Information about tobacco cessation classes and interventions provided to patient. Yes Patient provided with ticket for LDCT Scan. no Written Order for Lung Cancer Screening with LDCT placed in Epic. Yes (CT Chest Lung Cancer Screening Low Dose W/O CM) [id number] Z12.2-Screening of respiratory organs Z87.891-Personal history of nicotine dependence   Laneta Speaks, RN

## 2024-01-24 ENCOUNTER — Other Ambulatory Visit (HOSPITAL_COMMUNITY): Payer: Self-pay

## 2024-01-24 ENCOUNTER — Ambulatory Visit
Admission: RE | Admit: 2024-01-24 | Discharge: 2024-01-24 | Disposition: A | Discharge status: Home / Self Care | Source: Ambulatory Visit | Attending: Acute Care | Admitting: Acute Care

## 2024-01-24 ENCOUNTER — Telehealth: Payer: Self-pay

## 2024-01-24 DIAGNOSIS — Z122 Encounter for screening for malignant neoplasm of respiratory organs: Secondary | ICD-10-CM

## 2024-01-24 DIAGNOSIS — Z87891 Personal history of nicotine dependence: Secondary | ICD-10-CM

## 2024-01-24 DIAGNOSIS — F17210 Nicotine dependence, cigarettes, uncomplicated: Secondary | ICD-10-CM

## 2024-01-24 NOTE — Telephone Encounter (Signed)
*  Pulm  Pharmacy Patient Advocate Encounter   Received notification from CoverMyMeds that prior authorization for Albuterol  Sulfate (2.5 MG/3ML)0.083% nebulizer solution  is required/requested.   Insurance verification completed.   The patient is insured through [facility] .   Per test claim: Medication is not eligible for pharmacy benefits and must be billed through medical insurance. As our team only handles pharmacy related prior auths, medical PA's must be submitted by the clinic. Thank you

## 2024-01-25 NOTE — Telephone Encounter (Signed)
 Frances Nelson,   Can you look at this with me?

## 2024-01-25 NOTE — Telephone Encounter (Signed)
 PA team, can you please clarify? Does the Rx need to be filed under part B?

## 2024-01-28 NOTE — Telephone Encounter (Signed)
 Frances Nelson, please call the pharmacy and ask them to bill under part B. Thanks

## 2024-01-28 NOTE — Telephone Encounter (Signed)
 Yes- medication needs to be billed to Medicare Part B benefit.

## 2024-01-31 MED ORDER — ALBUTEROL SULFATE (2.5 MG/3ML) 0.083% IN NEBU: 2.5000 mg | INHALATION_SOLUTION | Freq: Four times a day (QID) | RESPIRATORY_TRACT | 5 refills | Status: AC | PRN

## 2024-01-31 NOTE — Addendum Note (Signed)
 Addended by: Deshane Cotroneo T on: 01/31/2024 11:31 AM   Modules accepted: Orders

## 2024-01-31 NOTE — Telephone Encounter (Signed)
 I called [facility] pharmacy and spoke to a pharmacist. She stated that a new order would have to be placed with an ICD 10 code to be billed with Medicare Part B. 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

## 2024-02-04 ENCOUNTER — Other Ambulatory Visit: Payer: Self-pay | Admitting: Acute Care

## 2024-02-04 DIAGNOSIS — F17210 Nicotine dependence, cigarettes, uncomplicated: Secondary | ICD-10-CM

## 2024-02-04 DIAGNOSIS — Z87891 Personal history of nicotine dependence: Secondary | ICD-10-CM

## 2024-02-04 DIAGNOSIS — Z122 Encounter for screening for malignant neoplasm of respiratory organs: Secondary | ICD-10-CM

## 2024-02-08 ENCOUNTER — Encounter: Payer: Self-pay | Admitting: Orthopedic Surgery

## 2024-02-13 ENCOUNTER — Telehealth: Payer: Self-pay | Admitting: Orthopedic Surgery

## 2024-02-13 NOTE — Telephone Encounter (Signed)
 Patient called. She would like Lauren to call her. She has some questions.

## 2024-02-13 NOTE — Telephone Encounter (Signed)
 Tried calling. Someone answered but there was a lot of background noise. Unable to hear patient. Sending [employer].

## 2024-02-14 ENCOUNTER — Encounter (HOSPITAL_COMMUNITY): Payer: Self-pay

## 2024-02-14 ENCOUNTER — Encounter: Payer: Self-pay | Admitting: Emergency Medicine

## 2024-02-14 ENCOUNTER — Encounter: Payer: Self-pay | Admitting: Internal Medicine

## 2024-02-14 NOTE — Progress Notes (Signed)
 PCP - Dr Norleen Agent Cardiologist - none Neurology - Dr Venetia Potters Pulmonology - Dr Lamar Chris  CT Chest x-ray - 01/24/24 EKG - 02/15/24 Stress Test - n/a ECHO - n/a Cardiac Cath - n/a  ICD Pacemaker/Loop - n/a  Sleep Study -  n/a  Diabetes - n/a  Aspirin Blood Thinner Instructions:  n/a  ERAS - clear liquids til 4:30 AM DOS. PRE-SURGERY Ensure drink given with instructions   Anesthesia review: Yes  STOP now taking any Aspirin (unless otherwise instructed by your surgeon), Aleve , Naproxen , Ibuprofen , Motrin , Advil , Goody's, BC's, all herbal medications, fish oil, and all vitamins.   Coronavirus Screening Do you have any of the following symptoms:  Cough yes/no: No Fever (>100.55F)  yes/no: No Runny nose yes/no: No Sore throat yes/no: No Difficulty breathing/shortness of breath  yes/no: No  Have you traveled in the last 14 days and where? yes/no: No  Patient verbalized understanding of instructions that were given to them at the PAT appointment. Patient was also instructed that they will need to review over the PAT instructions again at home before surgery.

## 2024-02-14 NOTE — Telephone Encounter (Signed)
 FYI

## 2024-02-14 NOTE — Progress Notes (Signed)
 Surgical Instructions   Your procedure is scheduled on Thursday, 02/21/24. Report to [facility] Main Entrance A at 5:30 A.M., then check in with the Admitting office. Any questions or running late day of surgery: call [phone]  Questions prior to your surgery date: call [phone], Monday-Friday, 8am-4pm. If you experience any cold or flu symptoms such as cough, fever, chills, shortness of breath, etc. between now and your scheduled surgery, please notify us  at the above number.     Remember:  Do not eat after midnight the night before your surgery-Wednesday   You may drink clear liquids until 4:30 AM, the morning of your surgery-Thursday.   Clear liquids allowed are: Water, Non-Citrus Juices (without pulp), Carbonated Beverages, Clear Tea (no milk, honey, etc.), Black Coffee Only (NO MILK, CREAM OR POWDERED CREAMER of any kind), and Gatorade.    Take these medicines the morning of surgery with A SIP OF WATER: acetaminophen  (TYLENOL ) cetirizine (ZYRTEC)  ezetimibe  (ZETIA )  omeprazole  (PRILOSEC)   May take these medicines IF NEEDED: albuterol  (PROVENTIL ) (2.5 MG/3ML) NEB TX albuterol  (VENTOLIN  HFA) Inhaler gabapentin  (NEURONTIN )  methocarbamol  (ROBAXIN )  traMADol  (ULTRAM )    One week prior to surgery, STOP taking any Aspirin (unless otherwise instructed by your surgeon) Aleve , Naproxen , Ibuprofen , Mobic , Motrin , Advil , Goody's, BC's, all herbal medications, fish oil, and non-prescription vitamins.                     Do NOT Smoke (Tobacco/Vaping) for 24 hours prior to your procedure.  You will be asked to remove any contacts, glasses, piercing's, hearing aid's, dentures/partials prior to surgery. Please bring cases for these items if needed.    Patients discharged the day of surgery will not be allowed to drive home, and someone needs to stay with them for 24 hours.  SURGICAL WAITING ROOM VISITATION Patients may have no more than 2 support people in the waiting area -  these visitors may rotate.   Pre-op nurse will coordinate an appropriate time for 1 ADULT support person, who may not rotate, to accompany patient in pre-op.  Children under the age of 15 must have an adult with them who is not the patient and must remain in the main waiting area with an adult.  If the patient needs to stay at the hospital during part of their recovery, the visitor guidelines for inpatient rooms apply.  Please refer to the [facility] website for the visitor guidelines for any additional information.   If you received a COVID test during your pre-op visit  it is requested that you wear a mask when out in public, stay away from anyone that may not be feeling well and notify your surgeon if you develop symptoms. If you have been in contact with anyone that has tested positive in the last 10 days please notify you surgeon.      Pre-operative CHG Bathing Instructions   You can play a key role in reducing the risk of infection after surgery. Your skin needs to be as free of germs as possible. You can reduce the number of germs on your skin by washing with CHG (chlorhexidine  gluconate) soap before surgery. CHG is an antiseptic soap that kills germs and continues to kill germs even after washing.   DO NOT use if you have an allergy to chlorhexidine /CHG or antibacterial soaps. If your skin becomes reddened or irritated, stop using the CHG and notify one of our RNs at [phone].  TAKE A SHOWER THE NIGHT BEFORE-Wed. SURGERY AND THE DAY OF SURGERY-Thurs.    Please keep in mind the following:  DO NOT shave, including legs and underarms, 48 hours prior to surgery.   You may shave your face before/day of surgery.  Place clean sheets on your bed the night before surgery Use a clean washcloth (not used since being washed) for each shower. DO NOT sleep with pet's night before surgery.  CHG Shower Instructions:  Wash your face and private area with normal soap. If you  choose to wash your hair, wash first with your normal shampoo.  After you use shampoo/soap, rinse your hair and body thoroughly to remove shampoo/soap residue.  Turn the water OFF and apply half the bottle of CHG soap to a CLEAN washcloth.  Apply CHG soap ONLY FROM YOUR NECK DOWN TO YOUR TOES (washing for 3-5 minutes)  DO NOT use CHG soap on face, private areas, open wounds, or sores.  Pay special attention to the area where your surgery is being performed.  If you are having back surgery, having someone wash your back for you may be helpful. Wait 2 minutes after CHG soap is applied, then you may rinse off the CHG soap.  Pat dry with a clean towel  Put on clean pajamas    Additional instructions for the day of surgery: DO NOT APPLY any lotions, deodorants, cologne, or perfumes.   Do not wear jewelry or makeup Do not wear nail polish, gel polish, artificial nails, or any other type of covering on natural nails (fingers and toes) Do not bring valuables to the hospital. [facility] is not responsible for valuables/personal belongings. Put on clean/comfortable clothes.  Please brush your teeth.  Ask your nurse before applying any prescription medications to the skin.

## 2024-02-15 ENCOUNTER — Other Ambulatory Visit: Payer: Self-pay

## 2024-02-15 ENCOUNTER — Encounter (HOSPITAL_COMMUNITY): Payer: Self-pay

## 2024-02-15 ENCOUNTER — Encounter (HOSPITAL_COMMUNITY)
Admission: RE | Admit: 2024-02-15 | Discharge: 2024-02-15 | Disposition: A | Discharge status: Home / Self Care | Source: Ambulatory Visit | Attending: Orthopedic Surgery | Admitting: Orthopedic Surgery

## 2024-02-15 DIAGNOSIS — M797 Fibromyalgia: Secondary | ICD-10-CM | POA: Insufficient documentation

## 2024-02-15 DIAGNOSIS — I10 Essential (primary) hypertension: Secondary | ICD-10-CM | POA: Insufficient documentation

## 2024-02-15 DIAGNOSIS — Z01818 Encounter for other preprocedural examination: Secondary | ICD-10-CM | POA: Diagnosis not present

## 2024-02-15 DIAGNOSIS — J4489 Other specified chronic obstructive pulmonary disease: Secondary | ICD-10-CM | POA: Diagnosis not present

## 2024-02-15 DIAGNOSIS — F17200 Nicotine dependence, unspecified, uncomplicated: Secondary | ICD-10-CM | POA: Insufficient documentation

## 2024-02-15 DIAGNOSIS — E785 Hyperlipidemia, unspecified: Secondary | ICD-10-CM | POA: Diagnosis not present

## 2024-02-15 DIAGNOSIS — M75122 Complete rotator cuff tear or rupture of left shoulder, not specified as traumatic: Secondary | ICD-10-CM | POA: Diagnosis not present

## 2024-02-15 HISTORY — DX: Fibromyalgia: M79.7

## 2024-02-15 HISTORY — DX: Headache, unspecified: R51.9

## 2024-02-15 HISTORY — DX: Other specified chronic obstructive pulmonary disease: J44.89

## 2024-02-15 HISTORY — DX: Hyperlipidemia, unspecified: E78.5

## 2024-02-15 HISTORY — DX: Unspecified cataract: H26.9

## 2024-02-15 HISTORY — DX: Dependence on other enabling machines and devices: Z99.89

## 2024-02-15 HISTORY — DX: Other complications of anesthesia, initial encounter: T88.59XA

## 2024-02-15 HISTORY — DX: Dyspnea, unspecified: R06.00

## 2024-02-15 LAB — CBC
HCT: 47.2 % — ABNORMAL HIGH (ref 36.0–46.0)
Hemoglobin: 15.5 g/dL — ABNORMAL HIGH (ref 12.0–15.0)
MCH: 30.9 pg (ref 26.0–34.0)
MCHC: 32.8 g/dL (ref 30.0–36.0)
MCV: 94 fL (ref 80.0–100.0)
Platelets: 345 K/uL (ref 150–400)
RBC: 5.02 MIL/uL (ref 3.87–5.11)
RDW: 12.9 % (ref 11.5–15.5)
WBC: 8.3 K/uL (ref 4.0–10.5)
nRBC: 0 % (ref 0.0–0.2)

## 2024-02-15 LAB — BASIC METABOLIC PANEL WITH GFR
Anion gap: 8 (ref 5–15)
BUN: 13 mg/dL (ref 8–23)
CO2: 30 mmol/L (ref 22–32)
Calcium: 10.5 mg/dL — ABNORMAL HIGH (ref 8.9–10.3)
Chloride: 101 mmol/L (ref 98–111)
Creatinine, Ser: 0.71 mg/dL (ref 0.44–1.00)
GFR, Estimated: >60 mL/min (ref >60)
Glucose, Bld: 90 mg/dL (ref 70–99)
Potassium: 4.8 mmol/L (ref 3.5–5.1)
Sodium: 139 mmol/L (ref 135–145)

## 2024-02-15 NOTE — Progress Notes (Signed)
 Surgical Instructions   Your procedure is scheduled on Thursday, 02/21/24. Report to [facility] Main Entrance A at 5:30 A.M., then check in with the Admitting office. Any questions or running late day of surgery: call [phone]  Questions prior to your surgery date: call [phone], Monday-Friday, 8am-4pm. If you experience any cold or flu symptoms such as cough, fever, chills, shortness of breath, etc. between now and your scheduled surgery, please notify us  at the above number.     Remember:  Do not eat after midnight the night before your surgery-Wednesday   You may drink clear liquids until 4:30 AM, the morning of your surgery-Thursday.   Clear liquids allowed are: Water, Non-Citrus Juices (without pulp), Carbonated Beverages, Clear Tea (no milk, honey, etc.), Black Coffee Only (NO MILK, CREAM OR POWDERED CREAMER of any kind), and Gatorade.  Please complete your PRE-SURGERY ENSURE that was provided to you by 4:30 AM, the morning of surgery.  Please, if able, drink it in one setting. DO NOT SIP. This will be the last thing that you will drink on the morning of surgery.    Take these medicines the morning of surgery with A SIP OF WATER: acetaminophen  (TYLENOL ) cetirizine (ZYRTEC)  ezetimibe  (ZETIA )  omeprazole  (PRILOSEC)   May take these medicines IF NEEDED: albuterol  (PROVENTIL ) (2.5 MG/3ML) NEB TX albuterol  (VENTOLIN  HFA) Inhaler gabapentin  (NEURONTIN )  methocarbamol  (ROBAXIN )  traMADol  (ULTRAM )    One week prior to surgery, STOP taking any Aspirin (unless otherwise instructed by your surgeon) Aleve , Naproxen , Ibuprofen , Mobic , Motrin , Advil , Goody's, BC's, all herbal medications, fish oil, and non-prescription vitamins.                     Do NOT Smoke (Tobacco/Vaping) for 24 hours prior to your procedure.  You will be asked to remove any contacts, glasses, piercing's, hearing aid's, dentures/partials prior to surgery. Please bring cases for these items if needed.     Patients discharged the day of surgery will not be allowed to drive home, and someone needs to stay with them for 24 hours.  SURGICAL WAITING ROOM VISITATION Patients may have no more than 2 support people in the waiting area - these visitors may rotate.   Pre-op nurse will coordinate an appropriate time for 1 ADULT support person, who may not rotate, to accompany patient in pre-op.  Children under the age of 45 must have an adult with them who is not the patient and must remain in the main waiting area with an adult.  If the patient needs to stay at the hospital during part of their recovery, the visitor guidelines for inpatient rooms apply.  Please refer to the [facility] website for the visitor guidelines for any additional information.   If you received a COVID test during your pre-op visit  it is requested that you wear a mask when out in public, stay away from anyone that may not be feeling well and notify your surgeon if you develop symptoms. If you have been in contact with anyone that has tested positive in the last 10 days please notify you surgeon.      Pre-operative CHG Bathing Instructions   You can play a key role in reducing the risk of infection after surgery. Your skin needs to be as free of germs as possible. You can reduce the number of germs on your skin by washing with CHG (chlorhexidine  gluconate) soap before surgery. CHG is an antiseptic soap that kills germs and continues to kill  germs even after washing.   DO NOT use if you have an allergy to chlorhexidine /CHG or antibacterial soaps. If your skin becomes reddened or irritated, stop using the CHG and notify one of our RNs at [phone].              TAKE A SHOWER THE NIGHT BEFORE-Wed. SURGERY AND THE DAY OF SURGERY-Thurs.    Please keep in mind the following:  DO NOT shave, including legs and underarms, 48 hours prior to surgery.   You may shave your face before/day of surgery.  Place clean sheets on your bed  the night before surgery Use a clean washcloth (not used since being washed) for each shower. DO NOT sleep with pet's night before surgery.  CHG Shower Instructions:  Wash your face and private area with normal soap. If you choose to wash your hair, wash first with your normal shampoo.  After you use shampoo/soap, rinse your hair and body thoroughly to remove shampoo/soap residue.  Turn the water OFF and apply half the bottle of CHG soap to a CLEAN washcloth.  Apply CHG soap ONLY FROM YOUR NECK DOWN TO YOUR TOES (washing for 3-5 minutes)  DO NOT use CHG soap on face, private areas, open wounds, or sores.  Pay special attention to the area where your surgery is being performed.  If you are having back surgery, having someone wash your back for you may be helpful. Wait 2 minutes after CHG soap is applied, then you may rinse off the CHG soap.  Pat dry with a clean towel  Put on clean pajamas    Additional instructions for the day of surgery: DO NOT APPLY any lotions, deodorants, cologne, or perfumes.   Do not wear jewelry or makeup Do not wear nail polish, gel polish, artificial nails, or any other type of covering on natural nails (fingers and toes) Do not bring valuables to the hospital. [facility] is not responsible for valuables/personal belongings. Put on clean/comfortable clothes.  Please brush your teeth.  Ask your nurse before applying any prescription medications to the skin.

## 2024-02-18 NOTE — Progress Notes (Signed)
 Anesthesia Chart Review: .  Case: [id number] Date/Time: 02/21/24 0715   Procedures:      ARTHROSCOPY, SHOULDER WITH DEBRIDEMENT (Left) - left shoulder arthroscopy, debridement, biceps tenodesis, mini open rotator cuff tear repair     TENODESIS, BICEPS (Left)     REPAIR, ROTATOR CUFF, OPEN (Left: Shoulder)   Anesthesia type: General   Diagnosis: Tear of left rotator cuff, unspecified tear extent, unspecified whether traumatic [M75.102]   Pre-op diagnosis: left shoulder rotator cuff tear   [facility]: [facility] ROOM 04 / [facility]   Surgeons: Addie Cordella Hamilton, MD       DISCUSSION: Patient is a 68 year old female scheduled for the above procedure.  History includes smoking, COPD, asthma, HTN, GERD, HLD, exertional dyspnea, fibromyalgia, spinal surgery (L4-5 laminectomy, TLIF 05/18/22), left knee arthroscopy (12/2022)  She is followed by rheumatology for osteoarthritis sicca symptoms with positive ANA. She reported prolonged emergence after anesthesia.   Last pulmonology follow-up was on 01/02/24 with Hope Norris, NP.  Seen for follow-up COPD with asthma.  Current smoker.  She has had difficulty maintaining a regular inhaler regimen due to high deductible cost.  She had previously been on DuoNeb but it caused uncontrollable coughing leading to its discontinuation.  She had similar reaction to budesonide . Previous inhalers include Advair , Spiriva , Flovent , and Breo, with variable tolerance and efficacy.  For management she uses albuterol  nebulizer 3-4 times per day or MDI when outside of the home. NP reached out to pharmacy for more cost effective options for ICS/LABA. The cheapest option for her would be Breyna  ($191.41 until $8500 medication deductible is met), so NP felt patient could continue albuterol  Q 6 hours if symptoms currently managed.  She was given resources for smoking cessation.  02/15/24 EKG showed NSR. Labs stable. H/H 15.5/47.2, Cr 0.71, glucose 90.   I called her to inquire about last  dose of phentermine . She reported last dose was 02/16/24. She apparently understood PAT medication instructions to include stopping any medications not listed on the day of surgery medications, so she had not had losartan  PM dose since 02/14/24. I explained that the medication instructions were pertaining to day of surgery. We went over pre-operative medications again, and she was told she could resume losartan  until the morning of surgery. She reported her COPD/asthma were currently controlled on albuterol . She feels DOE is stable. For example, she was able to walk nearly 1/4 mile (each way) to a neighbors and may feel a little winded at the end, but did not have to stop and rest during each leg of her trip. She denied chest pain, syncope. She did report recurrence of BLE edema (L > R) over the past ~ 7-10 days.  She says it is fairly typical for her to have left foot or ankle swelling due to chronic achilles tendinitis issues and prior LLE surgeries, but that more recently she has had worsening BLE edema with LLE edema intermittently up to her knee and RLE edema intermittently up to her mid calf. She said her weight was also up about 3 lbs. She says she has had edema to this extent in the past, but had been better under control for at least the past 3 months. She had been on Mobic  and hydrochlorothiazide  for edema, but stopped hydrochlorothiazide  about 6 months ago because she did not think it was helping. Mobic  is on hold for surgery. She denied any prolonged inactivity. She has been grocery shopping, walked to her friend's home (~1/4 mile each way), worked  in the garden, etc just in the past several days. No known injury. No conversational dyspnea. Her PCP is Dr. Lynwood Rush. His office was closed for the day by the time I spoke with the patient. Her symptoms have been going on for at least a week and without acute change over the weekend and no change in breathing. She also took hydrochlorothiazide  12.5 mg on  02/18/24 PM to see if that would help with swelling. If no new changes overnight (that might warrant more urgent evaluation), then we discussed contacting Dr. Nicola office on 02/19/24 AM. Synthia that although she has had some chronic issues with LE edema, with recurrent exacerbation with weight gain, we would like PCP recommendations regarding if any medications adjustments and/or additional testing that might be needed prior to surgery.  She also expressed concern about not having enough help at home once she is discharged. Her son lives 30 minutes away from her. He is currently planning to take her home after surgery and stay with her the first night, but then he goes back to work. She thinks she may need home health aide or therapist.   I sent a message to Dr. Addie and staff regarding above. Chart will be left for follow-up.    ADDENDUM 02/20/24 9:13 AM: Patient did have follow-up with PCP Dr. Rush on 02/20/24. COPD felt stable. He advised Zyrtec and Nasonex  for allergic rhinitis symptoms. He felt recurrent LE edema was in the setting of not taking her prescribed hydrochlorothiazide  12.5 mg daily.  He had her take 25 mg daily on 02/19/24 and 02/20/24 then resume 12.5 mg after surgery. He wished her well with planned shoulder surgery.   Anesthesia team to evaluate on the day of surgery.      VS: BP (!) 146/74   Pulse 87   Temp 36.7 C   Resp 18   Ht 5' 4 (1.626 m)   Wt 99.3 kg   SpO2 95%   BMI 37.57 kg/m    PROVIDERS: Rush Lynwood ORN, MD is PCP  - Shelah Charleston, MD is pulmonologist. As above, last APP visit 01/02/24. On 02/15/24, patient notified Dr. Shelah of plans for surgery.  GLENWOOD Dolphus Reiter, MD is rheumatologist.  Last visit 04/05/2023 with 1 year follow-up planned. GLENWOOD Leigh Ditch, MD is neurologist. Last visit 04/25/23 for migraines, numbness and tingling in hands and BLE. Carpel tunnel syndrome and OA felt to be contributing to hand symptoms, some improvement with splinting.  Lower extremity  symptoms attributed to OA and lumbar stenosis, with some improvement with PT/water therapy and Cymbalta . Try pain management again.    LABS: Labs reviewed: Acceptable for surgery. Ca 10.5, no change from 10/11/23. A1c 6.1% and LFTs normal 10/11/23.  (all labs ordered are listed, but only abnormal results are displayed)  Labs Reviewed  CBC - Abnormal; Notable for the following components:      Result Value   Hemoglobin 15.5 (*)    HCT 47.2 (*)    All other components within normal limits  BASIC METABOLIC PANEL WITH GFR - Abnormal; Notable for the following components:   Calcium  10.5 (*)    All other components within normal limits    IMAGES: CT Chest LCS 01/24/24: IMPRESSION: 1. Lung-RADS 2, benign appearance or behavior. Continue annual screening with low-dose chest CT without contrast in 12 months. 2. Two-vessel coronary atherosclerosis. 3. Aortic Atherosclerosis (ICD10-I70.0) and Emphysema (ICD10-J43.9).   MRI C-spine 12/11/23: IMPRESSION: - Within the limitations of motion artifact, no acute  abnormality of the cervical spine. - Mild degenerative changes as above. No high-grade spinal canal stenosis. Mild foraminal stenosis on the left at C2-3 and C5-6.  MRI Left Shoulder 12/11/23: IMPRESSION: 1. Full-thickness tear of the mid and anterior supraspinatus tendon footprint in a region measuring up to 2.3 cm in AP dimension with up to 2.2 cm tendon retraction. Mild supraspinatus muscle atrophy. 2. Moderate thinning/fraying of the deep fibers of the subscapularis tendon insertion, greatest within the superior 50% of the subscapularis tendon insertion. 3. Mild-to-moderate degenerative changes of the acromioclavicular joint. Moderate distal lateral broad-based subacromial spurring. 4. Mild-to-moderate glenohumeral cartilage thinning.    EKG: 02/15/24: Normal sinus rhythm Normal ECG When compared with ECG of 11-May-2022 14:23, No significant change since last tracing Confirmed by  Michele Richardson [phone]) on 02/17/2024 11:00:47 PM   CV: N/A  Past Medical History:  Diagnosis Date   Ambulates with cane    straight cane   Arthritis    Asthma    Bronchitis, obstructive, chronic (HCC)    hx   Cataracts, bilateral    pt has not had surgery to remove as of 02/15/24   Complication of anesthesia    patient states slow to wake up after anesthesia   COPD (chronic obstructive pulmonary disease) (HCC)    Dyspnea    only with exertion   Fibromyalgia    GERD (gastroesophageal reflux disease)    Headache    HLD (hyperlipidemia)    Hypertension    Pneumonia    x several    Past Surgical History:  Procedure Laterality Date   ABDOMINAL HYSTERECTOMY     APPENDECTOMY     CYST EXCISION     upper right side lateral - benign   HERNIA REPAIR     Umbilical x 3   KNEE ARTHROSCOPY W/ MENISCAL REPAIR Left 12/2022   TONSILLECTOMY     TRANSFORAMINAL LUMBAR INTERBODY FUSION (TLIF) WITH PEDICLE SCREW FIXATION 1 LEVEL N/A 05/18/2022   Procedure: Lumbar Four-Five Open Laminectomy/Transforaminal Lumbar Interbody Fusion/Posterolateral fusion;  Surgeon: Cheryle Debby LABOR, MD;  Location: [facility];  Service: Neurosurgery;  Laterality: N/A;    MEDICATIONS:  phentermine  30 MG capsule   acetaminophen  (TYLENOL ) 650 MG CR tablet   acidophilus (RISAQUAD) CAPS capsule   Adapalene  (DIFFERIN ) 0.3 % gel   albuterol  (PROVENTIL ) (2.5 MG/3ML) 0.083% nebulizer solution   albuterol  (VENTOLIN  HFA) 108 (90 Base) MCG/ACT inhaler   alendronate  (FOSAMAX ) 70 MG tablet   Calcium -Vitamins C & D (CALCIUM /C/D PO)   celecoxib  (CELEBREX ) 100 MG capsule   cetirizine (ZYRTEC) 10 MG tablet   Cholecalciferol (HM VITAMIN D3 PO)   clindamycin  (CLEOCIN  T) 1 % SWAB   clobetasol  cream (TEMOVATE ) 0.05 %   cyclobenzaprine  (FLEXERIL ) 5 MG tablet   doxycycline  (VIBRAMYCIN ) 50 MG capsule   DULoxetine  (CYMBALTA ) 30 MG capsule   ezetimibe  (ZETIA ) 10 MG tablet   gabapentin  (NEURONTIN ) 100 MG capsule   hydrochlorothiazide   (HYDRODIURIL ) 12.5 MG tablet   lidocaine  (LIDODERM ) 5 %   lidocaine  (LMX) 4 % cream   LORazepam  (ATIVAN ) 0.5 MG tablet   losartan  (COZAAR ) 25 MG tablet   MELATONIN PO   meloxicam  (MOBIC ) 15 MG tablet   meloxicam  (MOBIC ) 15 MG tablet   methocarbamol  (ROBAXIN ) 750 MG tablet   mometasone  (NASONEX ) 50 MCG/ACT nasal spray   montelukast  (SINGULAIR ) 10 MG tablet   Multiple Vitamins-Minerals (MULTIVITAMIN WITH MINERALS) tablet   omeprazole  (PRILOSEC) 40 MG capsule   rosuvastatin  (CRESTOR ) 10 MG tablet   topiramate  (TOPAMAX ) 50  MG tablet   traMADol  (ULTRAM ) 50 MG tablet    triamcinolone  acetonide (KENALOG ) 10 MG/ML injection 10 mg   She is not currently taking Adapalene  gel, Celebrex , Clindamycin  1% swabs, Flexeril , Temovate  cream, doxycycline , Lidoderm  patch, Ativan , Mobic  7.5 mg, Crestor  10 mg, Topamax . She says she is not taking hydrochlorothiazide .   She takes Neurontin  on as needed.   Last phentermine  was on 02/16/24.   Mobic  is on hold.    Isaiah Ruder, PA-C Surgical Short Stay/Anesthesiology [facility] Phone [phone] [facility] Phone [phone] 02/18/2024 6:33 PM

## 2024-02-19 ENCOUNTER — Ambulatory Visit: Payer: Self-pay

## 2024-02-19 ENCOUNTER — Ambulatory Visit (INDEPENDENT_AMBULATORY_CARE_PROVIDER_SITE_OTHER): Admitting: Internal Medicine

## 2024-02-19 VITALS — BP 132/82 | HR 81 | Temp 98.3°F | Ht 64.0 in | Wt 219.0 lb

## 2024-02-19 DIAGNOSIS — E559 Vitamin D deficiency, unspecified: Secondary | ICD-10-CM | POA: Diagnosis not present

## 2024-02-19 DIAGNOSIS — F17200 Nicotine dependence, unspecified, uncomplicated: Secondary | ICD-10-CM | POA: Diagnosis not present

## 2024-02-19 DIAGNOSIS — I10 Essential (primary) hypertension: Secondary | ICD-10-CM | POA: Diagnosis not present

## 2024-02-19 DIAGNOSIS — J4489 Other specified chronic obstructive pulmonary disease: Secondary | ICD-10-CM

## 2024-02-19 DIAGNOSIS — R600 Localized edema: Secondary | ICD-10-CM

## 2024-02-19 DIAGNOSIS — J301 Allergic rhinitis due to pollen: Secondary | ICD-10-CM | POA: Diagnosis not present

## 2024-02-19 NOTE — Assessment & Plan Note (Signed)
 Last vitamin D Lab Results  Component Value Date   VD25OH 44.88 10/11/2023   Stable, cont oral replacement

## 2024-02-19 NOTE — Patient Instructions (Signed)
 Ok to take the HCT 25 mg today and tomorrow  Please continue all other medications as before, and refills have been done if requested.  Please have the pharmacy call with any other refills you may need.  Please keep your appointments with your specialists as you may have planned  Good luck with your left shoulder surgury soon!

## 2024-02-19 NOTE — Assessment & Plan Note (Signed)
 Lab Results  Component Value Date   HGBA1C 6.1 10/11/2023   Stable, pt to continue current medical treatment losartan  25 every day, hct 12.5 qd

## 2024-02-19 NOTE — Assessment & Plan Note (Signed)
 Pt counsled to quit, pt not ready

## 2024-02-19 NOTE — Progress Notes (Signed)
 Patient ID: Frances Nelson, female   DOB: [birth year], 68 y.o.   MRN: [id number]        Chief Complaint: follow up bilateral leg swelling left > right x 1.5 wks without pain       HPI:  Frances Nelson is a 68 y.o. female here with above, is due for left shoulder surgury on Thursday aug 7, Pt denies chest pain, increased sob or doe, wheezing, orthopnea, PND, palpitations, dizziness or syncope.  Has HCT 12.5 mg pills at home from previous script no longer prescribed, asking if ok to take this.   Pt denies polydipsia, polyuria, or new focal neuro s/s.    Pt denies fever, wt loss, night sweats, loss of appetite, or other constitutional symptoms   No overt bleeding.  Does have several wks ongoing nasal allergy symptoms with clearish congestion, itch and sneezing, without fever, pain, ST, cough, swelling or wheezing.       Wt Readings from Last 3 Encounters:  02/19/24 219 lb (99.3 kg)  02/15/24 218 lb 14.4 oz (99.3 kg)  12/18/23 219 lb (99.3 kg)   BP Readings from Last 3 Encounters:  02/19/24 132/82  02/15/24 (!) 146/74  01/02/24 130/84         Past Medical History:  Diagnosis Date   Allergy 01.01.1975   Ambulates with cane    straight cane   Arthritis    Asthma    Bronchitis, obstructive, chronic (HCC)    hx   Cataracts, bilateral    pt has not had surgery to remove as of 02/15/24   Complication of anesthesia    patient states slow to wake up after anesthesia   COPD (chronic obstructive pulmonary disease) (HCC)    Dyspnea    only with exertion   Fibromyalgia    GERD (gastroesophageal reflux disease)    Headache    HLD (hyperlipidemia)    Hypertension    Pneumonia    x several   Past Surgical History:  Procedure Laterality Date   ABDOMINAL HYSTERECTOMY     APPENDECTOMY     CYST EXCISION     upper right side lateral - benign   HERNIA REPAIR     Umbilical x 3   KNEE ARTHROSCOPY W/ MENISCAL REPAIR Left 12/2022   SPINE SURGERY  05/2021   TONSILLECTOMY     TRANSFORAMINAL  LUMBAR INTERBODY FUSION (TLIF) WITH PEDICLE SCREW FIXATION 1 LEVEL N/A 05/18/2022   Procedure: Lumbar Four-Five Open Laminectomy/Transforaminal Lumbar Interbody Fusion/Posterolateral fusion;  Surgeon: Cheryle Debby LABOR, MD;  Location: [facility] OR;  Service: Neurosurgery;  Laterality: N/A;    reports that she has been smoking cigarettes. She started smoking about 50 years ago. She has a 40.6 pack-year smoking history. She has never been exposed to tobacco smoke. She has never used smokeless tobacco. She reports current drug use. Drug: Amphetamines. She reports that she does not drink alcohol. family history includes Allergies in her son and son; Angina in her father; Arthritis in her mother; Asthma in her son, son, and son; Brain cancer in her brother; COPD in her mother; Cancer in her brother, brother, brother, sister, and sister; Depression in her mother; Drug abuse in her son; Early death in her son; Hearing loss in her father and mother; Heart disease in her father; Kidney disease in her brother and sister; Leukemia in her sister; Parkinson's disease in her father. Allergies  Allergen Reactions   Symbicort  [Budesonide -Formoterol  Fumarate] Other (See Comments)    Patient reported dizziness,  nausea, headaches and sore throat while using Symbicort  160. Reported on 09/03/17   Tramadol  Hives    Patient states can take tramadol  with a coating on the med as of 02/15/24.   Celebrex  [Celecoxib ]     Bleeding.     Chocolate Flavoring Agent (Non-Screening)    Ciprofloxacin Nausea And Vomiting    Headache, shaking.    Egg-Derived Products    Flavoring Agent (Non-Screening)     Unknown   Lisinopril  Cough    Pt off med for a week, some improvement   Lyrica [Pregabalin]     Made me out of it.    Penicillins     Patient preference   Wellbutrin [Bupropion]    Codeine Palpitations   Current Outpatient Medications on File Prior to Visit  Medication Sig Dispense Refill   acetaminophen  (TYLENOL ) 650 MG CR  tablet Take 1,300 mg by mouth every 8 (eight) hours.     acidophilus (RISAQUAD) CAPS capsule Take 1 capsule by mouth in the morning.     albuterol  (PROVENTIL ) (2.5 MG/3ML) 0.083% nebulizer solution Take 3 mLs (2.5 mg total) by nebulization every 6 (six) hours as needed for wheezing or shortness of breath. 150 mL 5   albuterol  (VENTOLIN  HFA) 108 (90 Base) MCG/ACT inhaler Inhale 2 puffs into the lungs every 6 (six) hours as needed for wheezing or shortness of breath. 8 g 6   alendronate  (FOSAMAX ) 70 MG tablet Take 1 tablet (70 mg total) by mouth every 7 (seven) days. Take with a full glass of water on an empty stomach. 12 tablet 3   Calcium -Vitamins C & D (CALCIUM /C/D PO) Take 1 tablet by mouth in the morning.     cetirizine (ZYRTEC) 10 MG tablet Take 10 mg by mouth in the morning.     Cholecalciferol (HM VITAMIN D3 PO) Take 5,000 Units by mouth in the morning.     DULoxetine  (CYMBALTA ) 30 MG capsule TAKE 1 CAPSULE IN THE MORNING AND 2 CAPSULES IN THE EVENING 270 capsule 4   ezetimibe  (ZETIA ) 10 MG tablet Take 1 tablet (10 mg total) by mouth daily. 90 tablet 3   gabapentin  (NEURONTIN ) 100 MG capsule Take 1 capsule (100 mg total) by mouth 3 (three) times daily. (Patient taking differently: Take 100 mg by mouth daily as needed (pain).) 90 capsule 5   lidocaine  (LIDODERM ) 5 % Place 1 patch onto the skin daily. Remove & Discard patch within 12 hours or as directed by MD 30 patch 0   lidocaine  (LMX) 4 % cream Apply 1 Application topically as needed.     losartan  (COZAAR ) 25 MG tablet TAKE 1 TABLET (25 MG TOTAL) BY MOUTH DAILY. 90 tablet 3   MELATONIN PO Take 5 mg by mouth at bedtime.     meloxicam  (MOBIC ) 15 MG tablet Take 0.5 tablets (7.5 mg total) by mouth daily. 60 tablet 0   meloxicam  (MOBIC ) 15 MG tablet Take 1 tablet (15 mg total) by mouth daily. 30 tablet 0   methocarbamol  (ROBAXIN ) 750 MG tablet TAKE 1 TABLET (750 MG TOTAL) BY MOUTH EVERY 6 (SIX) HOURS AS NEEDED FOR MUSCLE SPASMS (NOT COVERED) 120  tablet 2   mometasone  (NASONEX ) 50 MCG/ACT nasal spray Place 2 sprays into the nose every evening.     montelukast  (SINGULAIR ) 10 MG tablet TAKE 1 TABLET BY MOUTH EVERYDAY AT BEDTIME 90 tablet 3   Multiple Vitamins-Minerals (MULTIVITAMIN WITH MINERALS) tablet Take 1 tablet by mouth in the morning.     omeprazole  (PRILOSEC)  40 MG capsule TAKE 1 CAPSULE (40 MG TOTAL) BY MOUTH IN THE MORNING AND AT BEDTIME. 180 capsule 3   phentermine  30 MG capsule Take 1 capsule (30 mg total) by mouth every morning. 30 capsule 5   traMADol  (ULTRAM ) 50 MG tablet Take 1-2 tablets (50-100 mg total) by mouth every 6 (six) hours as needed. 30 tablet 0   hydrochlorothiazide  (HYDRODIURIL ) 12.5 MG tablet TAKE 1 TABLET BY MOUTH EVERY DAY (Patient not taking: Reported on 02/19/2024) 90 tablet 2   No current facility-administered medications on file prior to visit.        ROS:  All others reviewed and negative.  Objective        PE:  BP 132/82   Pulse 81   Temp 98.3 F (36.8 C)   Ht 5' 4 (1.626 m)   Wt 219 lb (99.3 kg)   SpO2 96%   BMI 37.59 kg/m                 Constitutional: Pt appears in NAD               HENT: Head: NCAT.                Right Ear: External ear normal.                 Left Ear: External ear normal.                Eyes: . Pupils are equal, round, and reactive to light. Conjunctivae and EOM are normal               Nose: without d/c or deformity               Neck: Neck supple. Gross normal ROM               Cardiovascular: Normal rate and regular rhythm.                 Pulmonary/Chest: Effort normal and breath sounds without rales or wheezing.                Abd:  Soft, NT, ND, + BS, no organomegaly               Neurological: Pt is alert. At baseline orientation, motor grossly intact               Skin: Skin is warm. No rashes, no other new lesions, LE edema - 1+ LLE with left knee effusion as well, also RLE trace ankle only               Psychiatric: Pt behavior is normal without  agitation   Micro: none  Cardiac tracings I have personally interpreted today:  none  Pertinent Radiological findings (summarize): none   Lab Results  Component Value Date   WBC 8.3 02/15/2024   HGB 15.5 (H) 02/15/2024   HCT 47.2 (H) 02/15/2024   PLT 345 02/15/2024   GLUCOSE 90 02/15/2024   CHOL 207 (H) 10/11/2023   TRIG 208.0 (H) 10/11/2023   HDL 50.00 10/11/2023   LDLDIRECT 143.0 10/05/2022   LDLCALC 115 (H) 10/11/2023   ALT 33 10/11/2023   AST 28 10/11/2023   NA 139 02/15/2024   K 4.8 02/15/2024   CL 101 02/15/2024   CREATININE 0.71 02/15/2024   BUN 13 02/15/2024   CO2 30 02/15/2024   TSH 1.78 10/11/2023   HGBA1C 6.1 10/11/2023   MICROALBUR 1.4 10/11/2023  Assessment/Plan:  BRECK HOLLINGER is a 68 y.o. White or Caucasian [1] female with  has a past medical history of Allergy (01.01.1975), Ambulates with cane, Arthritis, Asthma, Bronchitis, obstructive, chronic (HCC), Cataracts, bilateral, Complication of anesthesia, COPD (chronic obstructive pulmonary disease) (HCC), Dyspnea, Fibromyalgia, GERD (gastroesophageal reflux disease), Headache, HLD (hyperlipidemia), Hypertension, and Pneumonia.  Bilateral leg edema Recurred off hct, ok to take home HCT 25 mg today and tomorrow, but hold after that given surgury aug 7  COPD with asthma (HCC) Chronic stable, cont inhaler prn  Vitamin D  deficiency Last vitamin D  Lab Results  Component Value Date   VD25OH 44.88 10/11/2023   Stable, cont oral replacement   Smoker Pt counsled to quit, pt not ready  Hypertension Lab Results  Component Value Date   HGBA1C 6.1 10/11/2023   Stable, pt to continue current medical treatment losartan  25 every day, hct 12.5 qd   Allergic rhinitis Mild to mod, for restart zyrtec 10 every day and nasonex ,  to f/u any worsening symptoms or concerns  Followup: Return in about 6 weeks (around 04/03/2024).  Lynwood Rush, MD 02/19/2024 6:30 PM [facility] -  [facility]

## 2024-02-19 NOTE — Assessment & Plan Note (Signed)
 Mild to mod, for restart zyrtec 10 every day and nasonex ,  to f/u any worsening symptoms or concerns

## 2024-02-19 NOTE — Assessment & Plan Note (Signed)
 Chronic stable, cont inhaler prn

## 2024-02-19 NOTE — Assessment & Plan Note (Signed)
 Recurred off hct, ok to take home HCT 25 mg today and tomorrow, but hold after that given surgury aug 7

## 2024-02-19 NOTE — Telephone Encounter (Signed)
 FYI Only or Action Required?: FYI only for provider.  Patient was last seen in primary care on 12/11/2023 by Curtis Debby PARAS, MD.  Called Nurse Triage reporting Leg Swelling.  Symptoms began 1-2 weeks ago.  Symptoms are: gradually worsening.  Triage Disposition: See HCP Within 4 Hours (Or PCP Triage)  Patient/caregiver understands and will follow disposition?: Yes       Copied from CRM [id number]. Topic: Clinical - Red Word Triage >> Feb 19, 2024 10:36 AM Drema MATSU wrote: Red Word that prompted transfer to Nurse Triage: Patient is scheduled for surgery has swelling in her right foot and ankle and swelling all the way to her knee on left side and the Anesthesiologist PA is concerned        Reason for Disposition  [1] Thigh, calf, or ankle swelling AND [2] bilateral AND [3] 1 side is more swollen  Answer Assessment - Initial Assessment Questions 1. ONSET: When did the swelling start? (e.g., minutes, hours, days)     1-2 weeks 2. LOCATION: What part of the leg is swollen?  Are both legs swollen or just one leg?     Right foot/ankle and left leg up to the knee 3. SEVERITY: How bad is the swelling? (e.g., localized; mild, moderate, severe)     Moderate  4. REDNESS: Is there redness or signs of infection?     Some redness  5. PAIN: Is the swelling painful to touch? If Yes, ask: How painful is it?   (Scale 1-10; mild, moderate or severe)     7/10 left leg and left pain  6. FEVER: Do you have a fever? If Yes, ask: What is it, how was it measured, and when did it start?      No 7. CAUSE: What do you think is causing the leg swelling?     Unsure  8. MEDICAL HISTORY: Do you have a history of blood clots (e.g., DVT), cancer, heart failure, kidney disease, or liver failure?     No 9. RECURRENT SYMPTOM: Have you had leg swelling before? If Yes, ask: When was the last time? What happened that time?     Yes 10. OTHER SYMPTOMS: Do you have any other  symptoms? (e.g., chest pain, difficulty breathing)       Headache  Protocols used: Leg Swelling and Edema-A-AH

## 2024-02-20 ENCOUNTER — Encounter: Payer: Self-pay | Admitting: Internal Medicine

## 2024-02-20 NOTE — Anesthesia Preprocedure Evaluation (Addendum)
 Anesthesia Evaluation  Patient identified by MRN, date of birth, ID band Patient awake    Reviewed: Allergy & Precautions, NPO status , Patient's Chart, lab work & pertinent test results  History of Anesthesia Complications Negative for: history of anesthetic complications  Airway Mallampati: II  TM Distance: >3 FB Neck ROM: Full    Dental  (+) Missing,    Pulmonary asthma , COPD,  COPD inhaler, Current Smoker and Patient abstained from smoking.   Pulmonary exam normal        Cardiovascular hypertension, Pt. on medications Normal cardiovascular exam     Neuro/Psych  Headaches   Depression       GI/Hepatic Neg liver ROS,GERD  Medicated,,  Endo/Other  negative endocrine ROS  BMI 38  Renal/GU negative Renal ROS     Musculoskeletal  (+) Arthritis ,  Fibromyalgia -  Abdominal   Peds  Hematology negative hematology ROS (+)   Anesthesia Other Findings Day of surgery medications reviewed with patient.  Reproductive/Obstetrics                              Anesthesia Physical Anesthesia Plan  ASA: 2  Anesthesia Plan: General   Post-op Pain Management: Tylenol  PO (pre-op)* and Regional block*   Induction: Intravenous  PONV Risk Score and Plan: 3 and Treatment may vary due to age or medical condition, Ondansetron , Dexamethasone  and Midazolam   Airway Management Planned: Oral ETT  Additional Equipment: None  Intra-op Plan:   Post-operative Plan: Extubation in OR  Informed Consent: I have reviewed the patients History and Physical, chart, labs and discussed the procedure including the risks, benefits and alternatives for the proposed anesthesia with the patient or authorized representative who has indicated his/her understanding and acceptance.     Dental advisory given  Plan Discussed with: CRNA  Anesthesia Plan Comments: (PAT note written by Allison Zelenak, PA-C.  )          Anesthesia Quick Evaluation

## 2024-02-21 ENCOUNTER — Encounter (HOSPITAL_COMMUNITY): Payer: Self-pay | Admitting: Vascular Surgery

## 2024-02-21 ENCOUNTER — Encounter (HOSPITAL_COMMUNITY)
Admission: RE | Disposition: A | Discharge status: Home / Self Care | Payer: Self-pay | Source: Home / Self Care | Attending: Orthopedic Surgery

## 2024-02-21 ENCOUNTER — Ambulatory Visit (HOSPITAL_COMMUNITY)
Admission: RE | Admit: 2024-02-21 | Discharge: 2024-02-21 | Disposition: A | Discharge status: Home / Self Care | Attending: Orthopedic Surgery | Admitting: Orthopedic Surgery

## 2024-02-21 ENCOUNTER — Other Ambulatory Visit: Payer: Self-pay

## 2024-02-21 ENCOUNTER — Encounter (HOSPITAL_COMMUNITY): Payer: Self-pay | Admitting: Orthopedic Surgery

## 2024-02-21 ENCOUNTER — Ambulatory Visit (HOSPITAL_BASED_OUTPATIENT_CLINIC_OR_DEPARTMENT_OTHER): Payer: Self-pay | Admitting: Anesthesiology

## 2024-02-21 DIAGNOSIS — M7522 Bicipital tendinitis, left shoulder: Secondary | ICD-10-CM

## 2024-02-21 DIAGNOSIS — M65912 Unspecified synovitis and tenosynovitis, left shoulder: Secondary | ICD-10-CM

## 2024-02-21 DIAGNOSIS — F419 Anxiety disorder, unspecified: Secondary | ICD-10-CM | POA: Diagnosis not present

## 2024-02-21 DIAGNOSIS — M75122 Complete rotator cuff tear or rupture of left shoulder, not specified as traumatic: Secondary | ICD-10-CM

## 2024-02-21 DIAGNOSIS — F32A Depression, unspecified: Secondary | ICD-10-CM | POA: Insufficient documentation

## 2024-02-21 DIAGNOSIS — M797 Fibromyalgia: Secondary | ICD-10-CM | POA: Diagnosis not present

## 2024-02-21 DIAGNOSIS — S43432A Superior glenoid labrum lesion of left shoulder, initial encounter: Secondary | ICD-10-CM | POA: Diagnosis not present

## 2024-02-21 DIAGNOSIS — Z01818 Encounter for other preprocedural examination: Secondary | ICD-10-CM

## 2024-02-21 DIAGNOSIS — F17210 Nicotine dependence, cigarettes, uncomplicated: Secondary | ICD-10-CM | POA: Diagnosis not present

## 2024-02-21 DIAGNOSIS — Z79899 Other long term (current) drug therapy: Secondary | ICD-10-CM | POA: Diagnosis not present

## 2024-02-21 DIAGNOSIS — M75102 Unspecified rotator cuff tear or rupture of left shoulder, not specified as traumatic: Secondary | ICD-10-CM

## 2024-02-21 DIAGNOSIS — J4489 Other specified chronic obstructive pulmonary disease: Secondary | ICD-10-CM | POA: Insufficient documentation

## 2024-02-21 DIAGNOSIS — X58XXXA Exposure to other specified factors, initial encounter: Secondary | ICD-10-CM | POA: Insufficient documentation

## 2024-02-21 DIAGNOSIS — M1990 Unspecified osteoarthritis, unspecified site: Secondary | ICD-10-CM | POA: Diagnosis not present

## 2024-02-21 DIAGNOSIS — K219 Gastro-esophageal reflux disease without esophagitis: Secondary | ICD-10-CM | POA: Diagnosis not present

## 2024-02-21 DIAGNOSIS — I10 Essential (primary) hypertension: Secondary | ICD-10-CM | POA: Insufficient documentation

## 2024-02-21 HISTORY — PX: BICEPT TENODESIS: SHX5116

## 2024-02-21 HISTORY — PX: POSTERIOR LUMBAR FUSION 2 WITH HARDWARE REMOVAL: SHX7297

## 2024-02-21 HISTORY — PX: SHOULDER OPEN ROTATOR CUFF REPAIR: SHX2407

## 2024-02-21 SURGERY — ARTHROSCOPY, SHOULDER WITH DEBRIDEMENT
Anesthesia: General | Site: Shoulder | Laterality: Left

## 2024-02-21 MED ORDER — MIDAZOLAM HCL 2 MG/2ML IJ SOLN
INTRAMUSCULAR | Status: DC | PRN
  Administered 2024-02-21: 1 mg via INTRAVENOUS

## 2024-02-21 MED ORDER — PROPOFOL 10 MG/ML IV BOLUS
INTRAVENOUS | Status: AC
  Filled 2024-02-21: qty 20

## 2024-02-21 MED ORDER — CHLORHEXIDINE GLUCONATE 0.12 % MT SOLN
15.0000 mL | Freq: Once | OROMUCOSAL | Status: AC
  Administered 2024-02-21: 15 mL via OROMUCOSAL
  Filled 2024-02-21: qty 15

## 2024-02-21 MED ORDER — ONDANSETRON HCL 4 MG/2ML IJ SOLN
INTRAMUSCULAR | Status: AC
Start: 2024-02-21 — End: 2024-02-21
  Filled 2024-02-21: qty 4

## 2024-02-21 MED ORDER — OXYCODONE HCL 5 MG PO TABS
5.0000 mg | ORAL_TABLET | ORAL | 0 refills | Status: DC | PRN
Start: 2024-02-21 — End: 2024-04-03

## 2024-02-21 MED ORDER — LIDOCAINE 2% (20 MG/ML) 5 ML SYRINGE
INTRAMUSCULAR | Status: DC | PRN
  Administered 2024-02-21: 60 mg via INTRAVENOUS

## 2024-02-21 MED ORDER — ALBUTEROL SULFATE HFA 108 (90 BASE) MCG/ACT IN AERS
INHALATION_SPRAY | RESPIRATORY_TRACT | Status: AC
  Filled 2024-02-21: qty 6.7

## 2024-02-21 MED ORDER — OXYCODONE HCL 5 MG/5ML PO SOLN: 5.0000 mg | Freq: Once | ORAL | Status: DC | PRN

## 2024-02-21 MED ORDER — FENTANYL CITRATE (PF) 100 MCG/2ML IJ SOLN: 25.0000 ug | INTRAMUSCULAR | Status: DC | PRN

## 2024-02-21 MED ORDER — LIDOCAINE 2% (20 MG/ML) 5 ML SYRINGE
INTRAMUSCULAR | Status: AC
  Filled 2024-02-21: qty 10

## 2024-02-21 MED ORDER — DEXAMETHASONE SODIUM PHOSPHATE 10 MG/ML IJ SOLN
INTRAMUSCULAR | Status: DC | PRN
  Administered 2024-02-21: 5 mg via INTRAVENOUS

## 2024-02-21 MED ORDER — FENTANYL CITRATE (PF) 250 MCG/5ML IJ SOLN
INTRAMUSCULAR | Status: AC
  Filled 2024-02-21: qty 5

## 2024-02-21 MED ORDER — EPINEPHRINE PF 1 MG/ML IJ SOLN
INTRAMUSCULAR | Status: AC
  Filled 2024-02-21: qty 1

## 2024-02-21 MED ORDER — BUPIVACAINE-EPINEPHRINE (PF) 0.5% -1:200000 IJ SOLN
INTRAMUSCULAR | Status: DC | PRN
  Administered 2024-02-21: 15 mL via PERINEURAL

## 2024-02-21 MED ORDER — SUGAMMADEX SODIUM 200 MG/2ML IV SOLN
INTRAVENOUS | Status: DC | PRN
  Administered 2024-02-21 (×2): 100 mg via INTRAVENOUS

## 2024-02-21 MED ORDER — ONDANSETRON HCL 4 MG/2ML IJ SOLN
INTRAMUSCULAR | Status: DC | PRN
  Administered 2024-02-21: 4 mg via INTRAVENOUS

## 2024-02-21 MED ORDER — ACETAMINOPHEN 500 MG PO TABS
1000.0000 mg | ORAL_TABLET | Freq: Once | ORAL | Status: AC
  Administered 2024-02-21: 1000 mg via ORAL
  Filled 2024-02-21: qty 2

## 2024-02-21 MED ORDER — CEFAZOLIN SODIUM-DEXTROSE 2-4 GM/100ML-% IV SOLN
2.0000 g | INTRAVENOUS | Status: AC
  Administered 2024-02-21: 2 g via INTRAVENOUS
  Filled 2024-02-21: qty 100

## 2024-02-21 MED ORDER — BUPIVACAINE LIPOSOME 1.3 % IJ SUSP
INTRAMUSCULAR | Status: DC | PRN
  Administered 2024-02-21: 10 mL via PERINEURAL

## 2024-02-21 MED ORDER — ROCURONIUM BROMIDE 10 MG/ML (PF) SYRINGE
PREFILLED_SYRINGE | INTRAVENOUS | Status: DC | PRN
  Administered 2024-02-21: 70 mg via INTRAVENOUS

## 2024-02-21 MED ORDER — LACTATED RINGERS IV SOLN: INTRAVENOUS | Status: DC

## 2024-02-21 MED ORDER — PROPOFOL 10 MG/ML IV BOLUS
INTRAVENOUS | Status: DC | PRN
  Administered 2024-02-21: 50 mg via INTRAVENOUS
  Administered 2024-02-21: 40 mg via INTRAVENOUS
  Administered 2024-02-21: 160 mg via INTRAVENOUS

## 2024-02-21 MED ORDER — ALBUTEROL SULFATE HFA 108 (90 BASE) MCG/ACT IN AERS
INHALATION_SPRAY | RESPIRATORY_TRACT | Status: DC | PRN
  Administered 2024-02-21: 8 via RESPIRATORY_TRACT
  Administered 2024-02-21: 4 via RESPIRATORY_TRACT

## 2024-02-21 MED ORDER — PHENYLEPHRINE 80 MCG/ML (10ML) SYRINGE FOR IV PUSH (FOR BLOOD PRESSURE SUPPORT)
PREFILLED_SYRINGE | INTRAVENOUS | Status: AC
  Filled 2024-02-21: qty 10

## 2024-02-21 MED ORDER — IPRATROPIUM-ALBUTEROL 0.5-2.5 (3) MG/3ML IN SOLN
RESPIRATORY_TRACT | Status: AC
Start: 2024-02-21 — End: 2024-02-21
  Filled 2024-02-21: qty 3

## 2024-02-21 MED ORDER — DEXAMETHASONE SODIUM PHOSPHATE 10 MG/ML IJ SOLN
INTRAMUSCULAR | Status: AC
  Filled 2024-02-21: qty 1

## 2024-02-21 MED ORDER — PHENYLEPHRINE 80 MCG/ML (10ML) SYRINGE FOR IV PUSH (FOR BLOOD PRESSURE SUPPORT)
PREFILLED_SYRINGE | INTRAVENOUS | Status: DC | PRN
  Administered 2024-02-21 (×3): 40 ug via INTRAVENOUS
  Administered 2024-02-21: 80 ug via INTRAVENOUS
  Administered 2024-02-21 (×2): 120 ug via INTRAVENOUS
  Administered 2024-02-21: 40 ug via INTRAVENOUS

## 2024-02-21 MED ORDER — MIDAZOLAM HCL 2 MG/2ML IJ SOLN
INTRAMUSCULAR | Status: AC
  Filled 2024-02-21: qty 2

## 2024-02-21 MED ORDER — FENTANYL CITRATE (PF) 250 MCG/5ML IJ SOLN
INTRAMUSCULAR | Status: DC | PRN
  Administered 2024-02-21 (×2): 50 ug via INTRAVENOUS

## 2024-02-21 MED ORDER — TRANEXAMIC ACID-NACL 1000-0.7 MG/100ML-% IV SOLN
1000.0000 mg | INTRAVENOUS | Status: AC
  Administered 2024-02-21: 1000 mg via INTRAVENOUS
  Filled 2024-02-21: qty 100

## 2024-02-21 MED ORDER — ORAL CARE MOUTH RINSE: 15.0000 mL | Freq: Once | OROMUCOSAL | Status: AC

## 2024-02-21 MED ORDER — IPRATROPIUM-ALBUTEROL 0.5-2.5 (3) MG/3ML IN SOLN
3.0000 mL | Freq: Once | RESPIRATORY_TRACT | Status: AC
  Administered 2024-02-21: 3 mL via RESPIRATORY_TRACT

## 2024-02-21 MED ORDER — OXYCODONE HCL 5 MG PO TABS: 5.0000 mg | ORAL_TABLET | Freq: Once | ORAL | Status: DC | PRN

## 2024-02-21 MED ORDER — PHENYLEPHRINE HCL-NACL 20-0.9 MG/250ML-% IV SOLN
INTRAVENOUS | Status: DC | PRN
Start: 2024-02-21 — End: 2024-02-21
  Administered 2024-02-21: 40 ug/min via INTRAVENOUS

## 2024-02-21 MED ORDER — DROPERIDOL 2.5 MG/ML IJ SOLN: 0.6250 mg | Freq: Once | INTRAMUSCULAR | Status: DC | PRN

## 2024-02-21 MED ORDER — BUPIVACAINE HCL (PF) 0.25 % IJ SOLN
INTRAMUSCULAR | Status: AC
  Filled 2024-02-21: qty 30

## 2024-02-21 MED ORDER — ROCURONIUM BROMIDE 10 MG/ML (PF) SYRINGE
PREFILLED_SYRINGE | INTRAVENOUS | Status: AC
Start: 2024-02-21 — End: 2024-02-21
  Filled 2024-02-21: qty 20

## 2024-02-21 SURGICAL SUPPLY — 49 items
ANCHOR FBRTK 2.6 SUTURETAP 1.3 (Anchor) IMPLANT
ANCHOR SUT 1.8 FIBERTAK SB KL (Anchor) IMPLANT
ANCHOR SWIVELOCK BIO 4.75X19.1 (Anchor) IMPLANT
BAG COUNTER SPONGE SURGICOUNT (BAG) ×2 IMPLANT
BENZOIN TINCTURE PRP APPL 2/3 (GAUZE/BANDAGES/DRESSINGS) ×2 IMPLANT
BLADE SHAVER TORPEDO 4X13 (MISCELLANEOUS) IMPLANT
BLADE SURG 11 STRL SS (BLADE) IMPLANT
BLADE SURG 15 STRL LF DISP TIS (BLADE) IMPLANT
COVER SURGICAL LIGHT HANDLE (MISCELLANEOUS) ×2 IMPLANT
DRAPE INCISE IOBAN 66X45 STRL (DRAPES) ×2 IMPLANT
DRAPE SURG IRRIG POUCH 19X23 (DRAPES) IMPLANT
DRAPE SURG ORHT 6 SPLT 77X108 (DRAPES) ×4 IMPLANT
DRAPE U-SHAPE 47X51 STRL (DRAPES) ×2 IMPLANT
DURAPREP 26ML APPLICATOR (WOUND CARE) ×2 IMPLANT
ELECT CAUTERY BLADE 6.4 (BLADE) ×2 IMPLANT
ELECTRODE REM PT RTRN 9FT ADLT (ELECTROSURGICAL) ×2 IMPLANT
GAUZE PAD ABD 8X10 STRL (GAUZE/BANDAGES/DRESSINGS) ×2 IMPLANT
GAUZE SPONGE 4X4 12PLY STRL (GAUZE/BANDAGES/DRESSINGS) ×2 IMPLANT
GAUZE XEROFORM 1X8 LF (GAUZE/BANDAGES/DRESSINGS) ×2 IMPLANT
GLOVE ECLIPSE 8.0 STRL XLNG CF (GLOVE) ×2 IMPLANT
GOWN STRL REUS W/ TWL LRG LVL3 (GOWN DISPOSABLE) ×4 IMPLANT
KIT BASIN OR (CUSTOM PROCEDURE TRAY) ×2 IMPLANT
KIT STR SPEAR 1.8 FBRTK DISP (KITS) IMPLANT
KIT TURNOVER KIT B (KITS) ×2 IMPLANT
MANIFOLD NEPTUNE II (INSTRUMENTS) ×2 IMPLANT
NDL 1/2 CIR CATGUT .05X1.09 (NEEDLE) IMPLANT
NDL HYPO 25GX1X1/2 BEV (NEEDLE) ×2 IMPLANT
NDL SPNL 18GX3.5 QUINCKE PK (NEEDLE) IMPLANT
NEEDLE 1/2 CIR CATGUT .05X1.09 (NEEDLE) IMPLANT
NEEDLE HYPO 25GX1X1/2 BEV (NEEDLE) ×2 IMPLANT
NEEDLE SPNL 18GX3.5 QUINCKE PK (NEEDLE) IMPLANT
NS IRRIG 1000ML POUR BTL (IV SOLUTION) ×2 IMPLANT
PACK SHOULDER (CUSTOM PROCEDURE TRAY) ×2 IMPLANT
PAD ARMBOARD POSITIONER FOAM (MISCELLANEOUS) ×4 IMPLANT
PASSER SUT SWANSON 36MM LOOP (INSTRUMENTS) IMPLANT
SLING ARM IMMOBILIZER LRG (SOFTGOODS) IMPLANT
SPIKE FLUID TRANSFER (MISCELLANEOUS) IMPLANT
SPONGE T-LAP 4X18 ~~LOC~~+RFID (SPONGE) ×4 IMPLANT
SUT VIC AB 1 CT1 27XBRD ANTBC (SUTURE) IMPLANT
SUT VIC AB 2-0 CT2 27 (SUTURE) ×2 IMPLANT
SUTURE 0 FIBERLP 38 BLU TPR ND (SUTURE) IMPLANT
SUTURE FIBERWR #2 38 T-5 BLUE (SUTURE) ×6 IMPLANT
SYR CONTROL 10ML LL (SYRINGE) ×2 IMPLANT
SYSTEM FBRTK BUTTON 2.6 (Anchor) IMPLANT
TOWEL GREEN STERILE (TOWEL DISPOSABLE) ×2 IMPLANT
TOWEL GREEN STERILE FF (TOWEL DISPOSABLE) ×2 IMPLANT
TRAY FOLEY MTR SLVR 16FR STAT (SET/KITS/TRAYS/PACK) IMPLANT
WAND ABLATOR APOLLO I90 (BUR) IMPLANT
WATER STERILE IRR 1000ML POUR (IV SOLUTION) ×2 IMPLANT

## 2024-02-21 NOTE — Transfer of Care (Signed)
 Immediate Anesthesia Transfer of Care Note  Patient: Frances Nelson  Procedure(s) Performed: ARTHROSCOPY, SHOULDER WITH DEBRIDEMENT (Left) TENODESIS, BICEPS (Left) REPAIR, ROTATOR CUFF, OPEN (Left: Shoulder)  Patient Location: PACU  Anesthesia Type:General and Regional  Level of Consciousness: drowsy and patient cooperative  Airway & Oxygen Therapy: Patient Spontanous Breathing and Patient connected to face mask oxygen  Post-op Assessment: Report given to RN and Post -op Vital signs reviewed and stable  Post vital signs: Reviewed and stable  Last Vitals:  Vitals Value Taken Time  BP 157/81 02/21/24 10:52  Temp 36.5 C 02/21/24 10:50  Pulse 89 02/21/24 10:54  Resp 19 02/21/24 10:54  SpO2 99 % 02/21/24 10:54  Vitals shown include unfiled device data.  Last Pain:  Vitals:   02/21/24 0649  TempSrc:   PainSc: 6       Patients Stated Pain Goal: 1 (02/21/24 9350)  Complications: No notable events documented.

## 2024-02-21 NOTE — Op Note (Unsigned)
 NAMEKATHARIN, Frances Nelson MEDICAL RECORD NO: [id number] ACCOUNT NO: [id number] DATE OF BIRTH: May 21, 1956 FACILITY: [facility] LOCATION: [facility] PHYSICIAN: Cordella RAMAN. Addie, MD  Operative Report   DATE OF PROCEDURE: 02/21/2024  PREOPERATIVE DIAGNOSES: 1. Left shoulder rotator cuff tear. 2. Biceps tendinitis.  POSTOPERATIVE DIAGNOSES: 1. Left shoulder rotator cuff tear. 2. Biceps tendinitis.  PROCEDURE: Left shoulder arthroscopy with limited debridement of superior labrum, biceps tendon, and rotator cuff tear with mini open biceps tenodesis and rotator cuff tear repair of a large rotator cuff tear measuring 2 x 3 cm with a 2 x 3 construct.  SURGEON ATTENDING: Cordella Glendia Addie, MD  ASSISTANT: Herlene Calix, PA  INDICATIONS: The patient is a 68 year old patient with left shoulder pain who presents for operative management after explanation of risks and benefits.  DESCRIPTION OF PROCEDURE: The patient was brought to the operating room where general anesthetic was induced. Preoperative antibiotics were administered. Timeout was called. The patient was placed in the beach chair position with the head in neutral  position. Shoulder, arm, and hand prescrubbed with alcohol and Betadine, allowed to air dry, prepped with DuraPrep solution and draped in a sterile manner. Ioban was used to seal the operative field and cover the axilla. Timeout was called. Examination  under anesthesia demonstrated range of motion of 50/95/165. After sterile prepping and draping, timeout was called. Posterior portal was created 2 cm medial and inferior to the posterolateral margin of the acromion. Diagnostic arthroscopy was performed.  Anterior portal was created under direct visualization. The patient did have some fraying of the intraarticular subscapularis, which was debrided. The patient also had degeneration and tendinitis of the biceps tendon as well as the superior labrum. This  was also debrided using an ArthroCare  wand. Glenohumeral articular surface was intact. Large rotator cuff tear was visualized involving the supraspinatus and part of the infraspinatus, U-shaped tear. The anterior-inferior and posterior-inferior  glenohumeral ligaments were intact. Glenohumeral articular surfaces also looked good. After arthroscopic biceps tendon release and limited debridement of the superior labrum and subscapularis partial-thickness rotator cuff tendon tear, the instruments  were removed. Portals were closed using 3-0 nylon. Ioban was then used to cover the entire operative field. Incision was made off the anterolateral margins of the acromion. Skin and subcutaneous tissue was sharply divided. The raphe between the anterior  and middle deltoid was identified. Stay suture placed #1 Vicryl suture between the anterior and middle deltoid bundles. The raphe was then incised and plane developed between the deltoid and the bursa. Self-retaining retractor was placed. The bursa was  excised. Biceps tendon was then tenodesed after opening up the transverse humeral ligament. This was done with one suture anchor proximally and distally within the bicipital groove. We did put FiberLoop around the biceps tendon and tensioned it under  appropriate tension. Oversewn with 2-0 Vicryl sutures. Stable construct achieved. Subscapularis was intact. Next, attention was directed towards the rotator cuff tear. The landing zone was prepared using a rongeur and a knife. The acromioplasty was  performed with a rasp. Next, the tendon edge was freshened. Next, we placed 5-0 FiberWire margin convergence sutures. The tear extended almost near the glenoid. This was the cleft portion of the tear. That was closed with sutures that were then tied,  which were the 0 FiberWire sutures. The next three that we placed more laterally were placed in an inverted fashion and they were not tied until we placed the suture anchors. The knotless suture anchors with four  SutureTapes each were  then placed at the  articular margin and the greater tuberosity. Next, the SutureTapes were passed with the Scorpion from posterior to anterior. At this time, with the SutureTapes passed, the final margin convergence sutures were tied, which were the 0 FiberWire sutures. We  also placed 0 Vicryl suture at the leading edge of the remnant tissue near the lateral aspect of the tuberosity on the anterior and posterior leaf. Next, these sutures, which were the margin convergence sutures as well as the sutures placed into the  lateral aspect of the anterior and posterior leaf were placed into one SwiveLock with very good reduction of the rotator cuff tendon to the footprint. The SutureTapes were then tied, crossed, and placed into anterior and posterior SwiveLocks with good  purchase obtained. Overall, a very stable construct was achieved. Arm was taken through a range of motion and found to have good range of motion. Thorough irrigation was then performed. The deltoid split was then closed using #1 Vicryl suture followed by  interrupted inverted 0 Vicryl suture, 2-0 Vicryl suture and 3-0 Monocryl with Steri-Strips and impervious dressing and a shoulder sling applied. Luke's assistance was required at all times for retraction, opening and closing, mobilization of tissue. His  assistance was of medical necessity.      PAA D: 02/21/2024 10:15:19 am T: 02/21/2024 11:39:00 am  JOB: [id number]/ [id number]

## 2024-02-21 NOTE — Anesthesia Postprocedure Evaluation (Signed)
 Anesthesia Post Note  Patient: Frances Nelson  Procedure(s) Performed: ARTHROSCOPY, SHOULDER WITH DEBRIDEMENT (Left) TENODESIS, BICEPS (Left) REPAIR, ROTATOR CUFF, OPEN (Left: Shoulder)     Patient location during evaluation: PACU Anesthesia Type: General Level of consciousness: awake and alert Pain management: pain level controlled Vital Signs Assessment: post-procedure vital signs reviewed and stable Respiratory status: spontaneous breathing, nonlabored ventilation and respiratory function stable Cardiovascular status: blood pressure returned to baseline Postop Assessment: no apparent nausea or vomiting Anesthetic complications: no   No notable events documented.  Last Vitals:  Vitals:   02/21/24 1115 02/21/24 1130  BP: (!) 148/74 133/61  Pulse: 95 90  Resp: 17 18  Temp:  36.5 C  SpO2: 92% 95%    Last Pain:  Vitals:   02/21/24 1115  TempSrc:   PainSc: 0-No pain                 [name]

## 2024-02-21 NOTE — Anesthesia Procedure Notes (Signed)
 Anesthesia Regional Block: Interscalene brachial plexus block   Pre-Anesthetic Checklist: , timeout performed,  Correct Patient, Correct Site, Correct Laterality,  Correct Procedure, Correct Position, site marked,  Risks and benefits discussed,  Pre-op evaluation,  At surgeon's request and post-op pain management  Laterality: Left  Prep: Maximum Sterile Barrier Precautions used, chloraprep       Needles:  Injection technique: Single-shot  Needle Type: Echogenic Stimulator Needle     Needle Length: 4cm  Needle Gauge: 22     Additional Needles:   Procedures:,,,, ultrasound used (permanent image in chart),,    Narrative:  Start time: 02/21/2024 7:05 AM End time: 02/21/2024 7:08 AM Injection made incrementally with aspirations every 5 mL.  Performed by: Personally  Anesthesiologist: Paul Lamarr BRAVO, MD  Additional Notes: Risks, benefits, and alternative discussed. Patient gave consent for procedure. Patient prepped and draped in sterile fashion. Sedation administered, patient remains easily responsive to voice. Relevant anatomy identified with ultrasound guidance. Local anesthetic given in 5cc increments with no signs or symptoms of intravascular injection. No pain or paraesthesias with injection. Patient monitored throughout procedure with signs of LAST or immediate complications. Tolerated well. Ultrasound image placed in chart.  LANEY Paul, MD

## 2024-02-21 NOTE — Brief Op Note (Signed)
   02/21/2024  10:08 AM  PATIENT:  Frances Nelson  68 y.o. female  PRE-OPERATIVE DIAGNOSIS:  left shoulder rotator cuff tear  POST-OPERATIVE DIAGNOSIS:  left shoulder rotator cuff tear  PROCEDURE:  Procedure(s): ARTHROSCOPY, SHOULDER WITH DEBRIDEMENT TENODESIS, BICEPS REPAIR, ROTATOR CUFF, OPEN  SURGEON:  Surgeon(s): Addie, Cordella Hamilton, MD  ASSISTANT: magnant pa  ANESTHESIA:   general  EBL: 15 ml    Total I/O In: 1200 [I.V.:1000; IV Piggyback:200] Out: 50 [Blood:50]  BLOOD ADMINISTERED: none  DRAINS: none   LOCAL MEDICATIONS USED:  none  SPECIMEN:  No Specimen  COUNTS:  YES  TOURNIQUET:  * No tourniquets in log *  DICTATION: .Other Dictation: Dictation Number [id number]  PLAN OF CARE: Discharge to home after PACU  PATIENT DISPOSITION:  PACU - hemodynamically stable

## 2024-02-21 NOTE — H&P (Signed)
 Frances Nelson is an 68 y.o. female.   Chief Complaint: left shoulder pain HPI: Frances Nelson is a 68 y.o. female who presents  reporting left shoulder pain of 6 months duration.  Patient is right-hand dominant.  Pain wakes her from sleep at night.  Pain is constant.  She reports decreased range of motion.  Also describes a radicular component of pain in that left shoulder arm and hand.  Also describes neck pain.  She has tried physical therapy and injections in the shoulder without relief.  No prior left shoulder surgery.  Has taken Neurontin  Tylenol  and duloxetine  without much relief.  No history of cardiac disease or diabetes.   MRI scan of the cervical spine done in June demonstrates mild degenerative changes with no high-grade spinal canal stenosis.  MRI of the left shoulder demonstrates full-thickness tear of the supraspinatus tendon with about 2 cm of retraction and mild muscle atrophy of the supraspinatus muscle belly.  There is some moderate thinning of the deep fibers of the subscap.  Mild to moderate glenohumeral joint cartilage thinning..   Past Medical History:  Diagnosis Date   Allergy 01.01.1975   Ambulates with cane    straight cane   Arthritis    Asthma    Bronchitis, obstructive, chronic (HCC)    hx   Cataracts, bilateral    pt has not had surgery to remove as of 02/15/24   Complication of anesthesia    patient states slow to wake up after anesthesia   COPD (chronic obstructive pulmonary disease) (HCC)    Dyspnea    only with exertion   Fibromyalgia    GERD (gastroesophageal reflux disease)    Headache    HLD (hyperlipidemia)    Hypertension    Pneumonia    x several    Past Surgical History:  Procedure Laterality Date   ABDOMINAL HYSTERECTOMY     APPENDECTOMY     CYST EXCISION     upper right side lateral - benign   HERNIA REPAIR     Umbilical x 3   KNEE ARTHROSCOPY W/ MENISCAL REPAIR Left 12/2022   SPINE SURGERY  05/2021   TONSILLECTOMY      TRANSFORAMINAL LUMBAR INTERBODY FUSION (TLIF) WITH PEDICLE SCREW FIXATION 1 LEVEL N/A 05/18/2022   Procedure: Lumbar Four-Five Open Laminectomy/Transforaminal Lumbar Interbody Fusion/Posterolateral fusion;  Surgeon: Cheryle Debby LABOR, MD;  Location: [facility] OR;  Service: Neurosurgery;  Laterality: N/A;    Family History  Problem Relation Age of Onset   Arthritis Mother    COPD Mother    Depression Mother    Hearing loss Mother    Angina Father    Parkinson's disease Father    Hearing loss Father    Heart disease Father    Leukemia Sister    Kidney disease Sister    Kidney disease Brother    Brain cancer Brother        glioblastoma    Cancer Brother    Allergies Son    Asthma Son    Allergies Son    Drug abuse Son    Early death Son    Asthma Son    Cancer Sister    Cancer Brother    Asthma Son    Cancer Brother    Cancer Sister    Social History:  reports that she has been smoking cigarettes. She started smoking about 50 years ago. She has a 40.6 pack-year smoking history. She has never been exposed to tobacco smoke.  She has never used smokeless tobacco. She reports current drug use. Drug: Amphetamines. She reports that she does not drink alcohol.  Allergies:  Allergies  Allergen Reactions   Symbicort  [Budesonide -Formoterol  Fumarate] Other (See Comments)    Patient reported dizziness, nausea, headaches and sore throat while using Symbicort  160. Reported on 09/03/17   Tramadol  Hives    Patient states can take tramadol  with a coating on the med as of 02/15/24.   Celebrex  [Celecoxib ]     Bleeding.     Chocolate Flavoring Agent (Non-Screening)    Ciprofloxacin Nausea And Vomiting    Headache, shaking.    Egg-Derived Products    Flavoring Agent (Non-Screening)     Unknown   Lisinopril  Cough    Pt off med for a week, some improvement   Lyrica [Pregabalin]     Made me out of it.    Penicillins     Patient preference   Wellbutrin [Bupropion]    Codeine Palpitations     Medications Prior to Admission  Medication Sig Dispense Refill   acetaminophen  (TYLENOL ) 650 MG CR tablet Take 1,300 mg by mouth every 8 (eight) hours.     acidophilus (RISAQUAD) CAPS capsule Take 1 capsule by mouth in the morning.     albuterol  (PROVENTIL ) (2.5 MG/3ML) 0.083% nebulizer solution Take 3 mLs (2.5 mg total) by nebulization every 6 (six) hours as needed for wheezing or shortness of breath. 150 mL 5   albuterol  (VENTOLIN  HFA) 108 (90 Base) MCG/ACT inhaler Inhale 2 puffs into the lungs every 6 (six) hours as needed for wheezing or shortness of breath. 8 g 6   alendronate  (FOSAMAX ) 70 MG tablet Take 1 tablet (70 mg total) by mouth every 7 (seven) days. Take with a full glass of water on an empty stomach. 12 tablet 3   Calcium -Vitamins C & D (CALCIUM /C/D PO) Take 1 tablet by mouth in the morning.     cetirizine (ZYRTEC) 10 MG tablet Take 10 mg by mouth in the morning.     Cholecalciferol (HM VITAMIN D3 PO) Take 5,000 Units by mouth in the morning.     DULoxetine  (CYMBALTA ) 30 MG capsule TAKE 1 CAPSULE IN THE MORNING AND 2 CAPSULES IN THE EVENING 270 capsule 4   ezetimibe  (ZETIA ) 10 MG tablet Take 1 tablet (10 mg total) by mouth daily. 90 tablet 3   gabapentin  (NEURONTIN ) 100 MG capsule Take 1 capsule (100 mg total) by mouth 3 (three) times daily. (Patient taking differently: Take 100 mg by mouth daily as needed (pain).) 90 capsule 5   lidocaine  (LMX) 4 % cream Apply 1 Application topically as needed.     losartan  (COZAAR ) 25 MG tablet TAKE 1 TABLET (25 MG TOTAL) BY MOUTH DAILY. 90 tablet 3   MELATONIN PO Take 5 mg by mouth at bedtime.     methocarbamol  (ROBAXIN ) 750 MG tablet TAKE 1 TABLET (750 MG TOTAL) BY MOUTH EVERY 6 (SIX) HOURS AS NEEDED FOR MUSCLE SPASMS (NOT COVERED) 120 tablet 2   mometasone  (NASONEX ) 50 MCG/ACT nasal spray Place 2 sprays into the nose every evening.     montelukast  (SINGULAIR ) 10 MG tablet TAKE 1 TABLET BY MOUTH EVERYDAY AT BEDTIME 90 tablet 3   Multiple  Vitamins-Minerals (MULTIVITAMIN WITH MINERALS) tablet Take 1 tablet by mouth in the morning.     omeprazole  (PRILOSEC) 40 MG capsule TAKE 1 CAPSULE (40 MG TOTAL) BY MOUTH IN THE MORNING AND AT BEDTIME. 180 capsule 3   phentermine  30 MG capsule Take  1 capsule (30 mg total) by mouth every morning. 30 capsule 5   traMADol  (ULTRAM ) 50 MG tablet Take 1-2 tablets (50-100 mg total) by mouth every 6 (six) hours as needed. 30 tablet 0   hydrochlorothiazide  (HYDRODIURIL ) 12.5 MG tablet TAKE 1 TABLET BY MOUTH EVERY DAY (Patient not taking: Reported on 02/19/2024) 90 tablet 2   lidocaine  (LIDODERM ) 5 % Place 1 patch onto the skin daily. Remove & Discard patch within 12 hours or as directed by MD 30 patch 0   meloxicam  (MOBIC ) 15 MG tablet Take 0.5 tablets (7.5 mg total) by mouth daily. 60 tablet 0   meloxicam  (MOBIC ) 15 MG tablet Take 1 tablet (15 mg total) by mouth daily. 30 tablet 0    No results found for this or any previous visit (from the past 48 hours). No results found.  Review of Systems  Musculoskeletal:  Positive for arthralgias.  All other systems reviewed and are negative.   There were no vitals taken for this visit. Physical Exam Vitals reviewed.  HENT:     Head: Normocephalic.     Nose: Nose normal.     Mouth/Throat:     Mouth: Mucous membranes are moist.  Eyes:     Pupils: Pupils are equal, round, and reactive to light.  Cardiovascular:     Rate and Rhythm: Normal rate.     Pulses: Normal pulses.  Pulmonary:     Effort: Pulmonary effort is normal.  Abdominal:     General: Abdomen is flat.  Musculoskeletal:     Cervical back: Normal range of motion.  Skin:    General: Skin is warm.     Capillary Refill: Capillary refill takes less than 2 seconds.  Neurological:     General: No focal deficit present.     Mental Status: She is alert.  Psychiatric:        Mood and Affect: Mood normal.    Ortho exam demonstrates bilateral shoulder range of motion of 50/95/165.  She has 5  out of 5 subscap strength bilaterally but external rotation strength on the left is 5-/5 compared to 5+ out of 5 on the right.  There is some coarseness in the on the left-hand side with internal/external rotation of the shoulder.  No discrete AC joint tenderness is present on that left-hand side.  Patient has 5 out of 5 grip EPL FPL interosseous wrist lection extension bicep triceps and deltoid strength.  Positive O'Brien's testing on the left positive bicipital groove tenderness on the left.  This is not present on the right.   Assessment/Plan Impression is rotator cuff tear and possible occult arthritis in the glenohumeral joint. She is having some biceps tendon symptoms as well as rotator cuff symptoms. I think in addition to her shoulder symptoms there is a component of radiculopathy even though the cervical spine MRI was unrevealing. That was a motion degraded study. She has some coarseness on exam with passive range of motion on that left-hand side but no evidence of frozen shoulder. Plan at this time is left shoulder arthroscopy with rotator cuff tear repair and biceps tenodesis. The risk and benefits of surgical intervention are discussed with the patient including not limited to infection nerve vessel damage incomplete pain relief as well as incomplete restoration of function. Retearing of the rotator cuff tear in this patient who is 68 years old is also a distinct possibility. Nonetheless I think this is her best first option to try to get some relief from shoulder  pain. All questions answered. Would like to use CPM machine for at least a week after surgery.   Frances Glendia Hutchinson, MD 02/21/2024, 6:11 AM

## 2024-02-21 NOTE — Anesthesia Procedure Notes (Signed)
 Procedure Name: Intubation Date/Time: 02/21/2024 7:32 AM  Performed by: Evetta Delon BROCKS, CRNAPre-anesthesia Checklist: Patient identified, Emergency Drugs available, Suction available and Patient being monitored Patient Re-evaluated:Patient Re-evaluated prior to induction Oxygen Delivery Method: Circle system utilized Preoxygenation: Pre-oxygenation with 100% oxygen Induction Type: IV induction Ventilation: Mask ventilation without difficulty Laryngoscope Size: Mac and 4 Grade View: Grade II Tube type: Oral Tube size: 7.0 mm Number of attempts: 1 Airway Equipment and Method: Stylet and Oral airway Placement Confirmation: ETT inserted through vocal cords under direct vision, positive ETCO2 and breath sounds checked- equal and bilateral Secured at: 21 cm Tube secured with: Tape Dental Injury: Teeth and Oropharynx as per pre-operative assessment

## 2024-02-22 ENCOUNTER — Other Ambulatory Visit: Payer: Self-pay | Admitting: Surgical

## 2024-02-22 ENCOUNTER — Encounter (HOSPITAL_COMMUNITY): Payer: Self-pay | Admitting: Orthopedic Surgery

## 2024-02-22 ENCOUNTER — Telehealth: Payer: Self-pay | Admitting: Orthopedic Surgery

## 2024-02-22 MED ORDER — METHOCARBAMOL 750 MG PO TABS: 750.0000 mg | ORAL_TABLET | Freq: Three times a day (TID) | ORAL | 2 refills | Status: AC | PRN

## 2024-02-22 NOTE — Telephone Encounter (Signed)
 Not discussed per my recollection

## 2024-02-22 NOTE — Telephone Encounter (Signed)
 I called and talked to the pt. She stated she was suppose to have a HH nurse come to her house because she has no one to care for her. Do we know if this was set up?

## 2024-02-22 NOTE — Telephone Encounter (Signed)
 Hey Dr. Addie, do you know if you discussed this with the patient or set this up?

## 2024-02-22 NOTE — Telephone Encounter (Signed)
 Yeah, I thought she had prescription already but it looks like it was an out of day prescription from like 2 years ago.  I refilled Robaxin  today.

## 2024-02-22 NOTE — Telephone Encounter (Signed)
 Patient called and said that she was supposed to get some muscle relaxers and didn't get none. She ask if you could call her please. CB#[phone]

## 2024-02-25 NOTE — Telephone Encounter (Signed)
 Can this pt do PT? Sherri said we probably wouldn't be able to do hh nursing

## 2024-02-25 NOTE — Progress Notes (Signed)
 Tobacco counseling provided (Intermediate counseling: 3 1/2 minutes) [id number]

## 2024-02-26 NOTE — Telephone Encounter (Signed)
 She has appt on 8/15 with Dr Addie, lets see how she's doing at that appointment and reassess

## 2024-02-26 NOTE — Telephone Encounter (Signed)
 I called and advised pt. She stated understanding. She stated understanding. She says she is doing ok but is in a lot of pain. She has says she has a rash from the bandage. Are you ok with she removes all of the bandaging now?

## 2024-02-26 NOTE — Telephone Encounter (Signed)
 Okay to remove tegaderm, xeroform, 4x4's. Would leave the sutures in place and do not remove the steri-strips

## 2024-02-27 NOTE — Telephone Encounter (Signed)
Lvm for pt to cb to advise 

## 2024-02-29 ENCOUNTER — Ambulatory Visit (INDEPENDENT_AMBULATORY_CARE_PROVIDER_SITE_OTHER): Admitting: Orthopedic Surgery

## 2024-02-29 ENCOUNTER — Encounter: Payer: Self-pay | Admitting: Orthopedic Surgery

## 2024-02-29 DIAGNOSIS — M75122 Complete rotator cuff tear or rupture of left shoulder, not specified as traumatic: Secondary | ICD-10-CM

## 2024-02-29 MED ORDER — TRAMADOL HCL 50 MG PO TABS
50.0000 mg | ORAL_TABLET | Freq: Four times a day (QID) | ORAL | 0 refills | Status: DC | PRN
Start: 2024-02-29 — End: 2024-04-03

## 2024-02-29 MED ORDER — CYCLOBENZAPRINE HCL 10 MG PO TABS: 10.0000 mg | ORAL_TABLET | Freq: Three times a day (TID) | ORAL | 0 refills | Status: AC

## 2024-02-29 MED ORDER — DIPHENHYDRAMINE HCL 25 MG PO CAPS: 25.0000 mg | ORAL_CAPSULE | Freq: Three times a day (TID) | ORAL | 0 refills | Status: DC | PRN

## 2024-02-29 NOTE — Progress Notes (Signed)
 Post-Op Visit Note   Patient: Frances Nelson           Date of Birth: 26-Jun-1956           MRN: [id number] Visit Date: 02/29/2024 PCP: Norleen Nelson ORN, MD   Assessment & Plan:  Chief Complaint:  Chief Complaint  Patient presents with   Left Shoulder - Routine Post Op    02/21/24 left shoulder scope; deb; BT; RCR   Visit Diagnoses: No diagnosis found.  Plan: Frances Nelson is now a week out left shoulder arthroscopy with debridement biceps tenodesis rotator cuff tear repair with a 2 x 3 construct.  Oxycodone  is making her sick.  She also broke out in a rash on her left arm.  Having some stomach cramps.  She is on the CPM machine at 63 degrees.  On exam she has pretty reasonable range of motion.  Does have a rash on that arm which could be related to the sling.  I do not see any other type of papular rash on her trunk.  Plan at this time is to discontinue the sling when sleeping.  Change over to tramadol  and Benadryl  as well as Flexeril  as a muscle relaxer.  Steri-Strips applied to the incisions which have been well-healed.  2-week return for clinical recheck.  She also describes some symptoms with the left side of her tongue.  I looked at it and she does have a small ulceration on the tongue.  Does not look necessarily posttraumatic.  Could be related to the tube position in her mouth when she was undergoing surgery.  I think it should be a self-limited problem.  We can recheck in 2 weeks when she comes back for clinical recheck.  Okay to use the CPM machine to what ever range of motion is tolerated.  Follow-Up Instructions: No follow-ups on file.   Orders:  No orders of the defined types were placed in this encounter.  Meds ordered this encounter  Medications   traMADol  (ULTRAM ) 50 MG tablet    Sig: Take 1 tablet (50 mg total) by mouth every 6 (six) hours as needed.    Dispense:  30 tablet    Refill:  0   cyclobenzaprine  (FLEXERIL ) 10 MG tablet    Sig: Take 1 tablet (10 mg total) by mouth  every 8 (eight) hours.    Dispense:  30 tablet    Refill:  0   diphenhydrAMINE  (BENADRYL ) 25 mg capsule    Sig: Take 1 capsule (25 mg total) by mouth every 8 (eight) hours as needed.    Dispense:  30 capsule    Refill:  0    Imaging: No results found.  PMFS History: Patient Active Problem List   Diagnosis Date Noted   Obesity 10/13/2023   Shingles outbreak 05/29/2023   Microhematuria 04/09/2023   Bilateral shoulder pain 04/09/2023   Urethritis 10/05/2022   External otitis of left ear 10/05/2022   Spondylolisthesis of lumbar region 05/18/2022   Smoker 04/08/2022   Deviated nasal septum 04/08/2022   Bilateral pain of leg and foot 04/08/2022   Follicular acne 04/06/2022   Baker cyst, left 04/06/2022   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   Depression 10/04/2021   Left knee pain 10/04/2021   Chronic pain 04/09/2021   Pelvic pain 04/09/2021   HLD (hyperlipidemia) 04/09/2021   Vitamin D  deficiency 04/09/2021   Estrogen deficiency 04/09/2021   Hyperglycemia 04/01/2021   Chronic cough 12/30/2020   GERD (  gastroesophageal reflux disease) 11/06/2019   Bilateral leg edema 10/16/2019   Pleuritic chest pain 10/16/2019   Hypertension 10/02/2019   Arthralgia 06/28/2018   Sinusitis 06/28/2018   Renal cyst 05/03/2018   Syncope 05/03/2018   Stress and adjustment reaction 05/03/2018   History of tobacco use 05/02/2018   Primary osteoarthritis of both hands 04/10/2018   Primary osteoarthritis of both knees 04/10/2018   Primary osteoarthritis of both feet 04/10/2018   Achilles tendinitis 04/02/2018   Screening for breast cancer 03/15/2018   Positive ANA (antinuclear antibody) 03/15/2018   Screening for colon cancer 03/15/2018   Rash 03/15/2018   Achilles tendon pain 11/13/2017   Cervical myelopathy with cervical radiculopathy (HCC) 11/13/2017   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   Allergic rhinitis 06/19/2017   Edema 12/24/2015   Carpal tunnel  syndrome, right 12/16/2015   Hypercalcemia 11/11/2015   Pain in both upper extremities 11/11/2015   Drug reaction 10/28/2015   Muscle spasms of both lower extremities 10/28/2015   Nerve pain 10/28/2015   Chronic asthmatic bronchitis with acute exacerbation (HCC) 09/06/2015   Environmental and seasonal allergies 07/17/2014   Past Medical History:  Diagnosis Date   Allergy 01.01.1975   Ambulates with cane    straight cane   Arthritis    Asthma    Bronchitis, obstructive, chronic (HCC)    hx   Cataracts, bilateral    pt has not had surgery to remove as of 02/15/24   Complication of anesthesia    patient states slow to wake up after anesthesia   COPD (chronic obstructive pulmonary disease) (HCC)    Dyspnea    only with exertion   Fibromyalgia    GERD (gastroesophageal reflux disease)    Headache    HLD (hyperlipidemia)    Hypertension    Pneumonia    x several    Family History  Problem Relation Age of Onset   Arthritis Mother    COPD Mother    Depression Mother    Hearing loss Mother    Angina Father    Parkinson's disease Father    Hearing loss Father    Heart disease Father    Leukemia Sister    Kidney disease Sister    Kidney disease Brother    Brain cancer Brother        glioblastoma    Cancer Brother    Allergies Son    Asthma Son    Allergies Son    Drug abuse Son    Early death Son    Asthma Son    Cancer Sister    Cancer Brother    Asthma Son    Cancer Brother    Cancer Sister     Past Surgical History:  Procedure Laterality Date   ABDOMINAL HYSTERECTOMY     APPENDECTOMY     BICEPT TENODESIS Left 02/21/2024   Procedure: TENODESIS, BICEPS;  Surgeon: Addie Cordella Hamilton, MD;  Location: [facility] OR;  Service: Orthopedics;  Laterality: Left;   CYST EXCISION     upper right side lateral - benign   HERNIA REPAIR     Umbilical x 3   KNEE ARTHROSCOPY W/ MENISCAL REPAIR Left 12/2022   POSTERIOR LUMBAR FUSION 2 WITH HARDWARE REMOVAL Left 02/21/2024    Procedure: ARTHROSCOPY, SHOULDER WITH DEBRIDEMENT;  Surgeon: Addie Cordella Hamilton, MD;  Location: [facility] OR;  Service: Orthopedics;  Laterality: Left;  left shoulder arthroscopy, debridement, biceps tenodesis, mini open rotator cuff tear repair   SHOULDER OPEN ROTATOR CUFF REPAIR  Left 02/21/2024   Procedure: REPAIR, ROTATOR CUFF, OPEN;  Surgeon: Addie Cordella Hamilton, MD;  Location: [facility] OR;  Service: Orthopedics;  Laterality: Left;   SPINE SURGERY  05/2021   TONSILLECTOMY     TRANSFORAMINAL LUMBAR INTERBODY FUSION (TLIF) WITH PEDICLE SCREW FIXATION 1 LEVEL N/A 05/18/2022   Procedure: Lumbar Four-Five Open Laminectomy/Transforaminal Lumbar Interbody Fusion/Posterolateral fusion;  Surgeon: Cheryle Debby LABOR, MD;  Location: [facility] OR;  Service: Neurosurgery;  Laterality: N/A;   Social History   Occupational History   Occupation: RETIRED  Tobacco Use   Smoking status: Every Day    Current packs/day: 1.00    Average packs/day: 1 pack/day for 40.6 years (40.6 ttl pk-yrs)    Types: Cigarettes    Start date: 82    Last attempt to quit: 2000    Passive exposure: Never   Smokeless tobacco: Never   Tobacco comments:    1 pack a day    01/23/24-Quit several times totaling 10 yrs but still currently smoking 1 ppd  Vaping Use   Vaping status: Never Used  Substance and Sexual Activity   Alcohol use: No   Drug use: Yes    Types: Amphetamines    Comment: on phentermine  for weight loss   Sexual activity: Not Currently    Birth control/protection: Surgical    Comment: Hysterectomy

## 2024-03-09 DIAGNOSIS — M75122 Complete rotator cuff tear or rupture of left shoulder, not specified as traumatic: Secondary | ICD-10-CM

## 2024-03-09 DIAGNOSIS — M7522 Bicipital tendinitis, left shoulder: Secondary | ICD-10-CM

## 2024-03-09 DIAGNOSIS — S43432A Superior glenoid labrum lesion of left shoulder, initial encounter: Secondary | ICD-10-CM

## 2024-03-09 DIAGNOSIS — M65912 Unspecified synovitis and tenosynovitis, left shoulder: Secondary | ICD-10-CM

## 2024-03-12 ENCOUNTER — Encounter: Payer: Self-pay | Admitting: Orthopedic Surgery

## 2024-03-12 ENCOUNTER — Ambulatory Visit (INDEPENDENT_AMBULATORY_CARE_PROVIDER_SITE_OTHER): Admitting: Orthopedic Surgery

## 2024-03-12 DIAGNOSIS — Z9889 Other specified postprocedural states: Secondary | ICD-10-CM

## 2024-03-12 NOTE — Progress Notes (Signed)
 Post-Op Visit Note   Patient: Frances Nelson           Date of Birth: 1955-11-25           MRN: [id number] Visit Date: 03/12/2024 PCP: Norleen Lynwood ORN, MD   Assessment & Plan:  Chief Complaint:  Chief Complaint  Patient presents with   Left Shoulder - Routine Post Op    02/21/24 left shoulder arthroscopy with debridement biceps tenodesis rotator cuff tear repair with a 2 x 3 construct   Visit Diagnoses:  1. S/P rotator cuff repair     Plan: Frances Nelson is now 3 weeks out left shoulder rotator cuff tear repair with 2 x 3 construct.  In a sling.  CPM machine at 108.  On exam she has no crepitus and range of motion of 30/80/100.  Plan at this time is to start physical therapy starting this week with active assisted range of motion and passive range of motion for 3 weeks and then she is okay for strengthening.  Discontinue sling.  No lifting with left arm.  Follow-up in 5 weeks for clinical recheck.  Follow-Up Instructions: Return in about 5 weeks (around 04/16/2024).   Orders:  Orders Placed This Encounter  Procedures   Ambulatory referral to Physical Therapy   No orders of the defined types were placed in this encounter.   Imaging: No results found.  PMFS History: Patient Active Problem List   Diagnosis Date Noted   Synovitis of left shoulder 03/09/2024   Biceps tendonitis on left 03/09/2024   Degenerative superior labral anterior-to-posterior (SLAP) tear of left shoulder 03/09/2024   Complete tear of left rotator cuff 03/09/2024   Obesity 10/13/2023   Shingles outbreak 05/29/2023   Microhematuria 04/09/2023   Bilateral shoulder pain 04/09/2023   Urethritis 10/05/2022   External otitis of left ear 10/05/2022   Spondylolisthesis of lumbar region 05/18/2022   Smoker 04/08/2022   Deviated nasal septum 04/08/2022   Bilateral pain of leg and foot 04/08/2022   Follicular acne 04/06/2022   Baker cyst, left 04/06/2022   Spinal stenosis of lumbar region with neurogenic  claudication 10/28/2021   Depression 10/04/2021   Left knee pain 10/04/2021   Chronic pain 04/09/2021   Pelvic pain 04/09/2021   HLD (hyperlipidemia) 04/09/2021   Vitamin D  deficiency 04/09/2021   Estrogen deficiency 04/09/2021   Hyperglycemia 04/01/2021   Chronic cough 12/30/2020   GERD (gastroesophageal reflux disease) 11/06/2019   Bilateral leg edema 10/16/2019   Pleuritic chest pain 10/16/2019   Hypertension 10/02/2019   Arthralgia 06/28/2018   Sinusitis 06/28/2018   Renal cyst 05/03/2018   Syncope 05/03/2018   Stress and adjustment reaction 05/03/2018   History of tobacco use 05/02/2018   Primary osteoarthritis of both hands 04/10/2018   Primary osteoarthritis of both knees 04/10/2018   Primary osteoarthritis of both feet 04/10/2018   Achilles tendinitis 04/02/2018   Screening for breast cancer 03/15/2018   Positive ANA (antinuclear antibody) 03/15/2018   Screening for colon cancer 03/15/2018   Rash 03/15/2018   Achilles tendon pain 11/13/2017   Cervical myelopathy with cervical radiculopathy (HCC) 11/13/2017   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   Allergic rhinitis 06/19/2017   Edema 12/24/2015   Carpal tunnel syndrome, right 12/16/2015   Hypercalcemia 11/11/2015   Pain in both upper extremities 11/11/2015   Drug reaction 10/28/2015   Muscle spasms of both lower extremities 10/28/2015   Nerve pain 10/28/2015   Chronic asthmatic bronchitis with acute exacerbation (HCC) 09/06/2015  Environmental and seasonal allergies 07/17/2014   Past Medical History:  Diagnosis Date   Allergy 01.01.1975   Ambulates with cane    straight cane   Arthritis    Asthma    Bronchitis, obstructive, chronic (HCC)    hx   Cataracts, bilateral    pt has not had surgery to remove as of 02/15/24   Complication of anesthesia    patient states slow to wake up after anesthesia   COPD (chronic obstructive pulmonary disease) (HCC)    Dyspnea    only with exertion   Fibromyalgia    GERD  (gastroesophageal reflux disease)    Headache    HLD (hyperlipidemia)    Hypertension    Pneumonia    x several    Family History  Problem Relation Age of Onset   Arthritis Mother    COPD Mother    Depression Mother    Hearing loss Mother    Angina Father    Parkinson's disease Father    Hearing loss Father    Heart disease Father    Leukemia Sister    Kidney disease Sister    Kidney disease Brother    Brain cancer Brother        glioblastoma    Cancer Brother    Allergies Son    Asthma Son    Allergies Son    Drug abuse Son    Early death Son    Asthma Son    Cancer Sister    Cancer Brother    Asthma Son    Cancer Brother    Cancer Sister     Past Surgical History:  Procedure Laterality Date   ABDOMINAL HYSTERECTOMY     APPENDECTOMY     BICEPT TENODESIS Left 02/21/2024   Procedure: TENODESIS, BICEPS;  Surgeon: Addie Cordella Hamilton, MD;  Location: [facility] OR;  Service: Orthopedics;  Laterality: Left;   CYST EXCISION     upper right side lateral - benign   HERNIA REPAIR     Umbilical x 3   KNEE ARTHROSCOPY W/ MENISCAL REPAIR Left 12/2022   POSTERIOR LUMBAR FUSION 2 WITH HARDWARE REMOVAL Left 02/21/2024   Procedure: ARTHROSCOPY, SHOULDER WITH DEBRIDEMENT;  Surgeon: Addie Cordella Hamilton, MD;  Location: [facility] OR;  Service: Orthopedics;  Laterality: Left;  left shoulder arthroscopy, debridement, biceps tenodesis, mini open rotator cuff tear repair   SHOULDER OPEN ROTATOR CUFF REPAIR Left 02/21/2024   Procedure: REPAIR, ROTATOR CUFF, OPEN;  Surgeon: Addie Cordella Hamilton, MD;  Location: [facility] OR;  Service: Orthopedics;  Laterality: Left;   SPINE SURGERY  05/2021   TONSILLECTOMY     TRANSFORAMINAL LUMBAR INTERBODY FUSION (TLIF) WITH PEDICLE SCREW FIXATION 1 LEVEL N/A 05/18/2022   Procedure: Lumbar Four-Five Open Laminectomy/Transforaminal Lumbar Interbody Fusion/Posterolateral fusion;  Surgeon: Cheryle Debby LABOR, MD;  Location: [facility] OR;  Service: Neurosurgery;  Laterality: N/A;   Social  History   Occupational History   Occupation: RETIRED  Tobacco Use   Smoking status: Every Day    Current packs/day: 1.00    Average packs/day: 1 pack/day for 40.7 years (40.7 ttl pk-yrs)    Types: Cigarettes    Start date: 87    Last attempt to quit: 2000    Passive exposure: Never   Smokeless tobacco: Never   Tobacco comments:    1 pack a day    01/23/24-Quit several times totaling 10 yrs but still currently smoking 1 ppd  Vaping Use   Vaping status: Never Used  Substance and  Sexual Activity   Alcohol use: No   Drug use: Yes    Types: Amphetamines    Comment: on phentermine  for weight loss   Sexual activity: Not Currently    Birth control/protection: Surgical    Comment: Hysterectomy

## 2024-03-13 ENCOUNTER — Encounter: Payer: Self-pay | Admitting: Orthopedic Surgery

## 2024-03-18 ENCOUNTER — Encounter: Payer: Self-pay | Admitting: Sports Medicine

## 2024-03-19 NOTE — Therapy (Signed)
 OUTPATIENT PHYSICAL THERAPY UPPER EXTREMITY EVALUATION   Patient Name: Frances Nelson MRN: [id number] DOB:May 12, 1956, 68 y.o., female Today's Date: 03/20/2024  END OF SESSION:  PT End of Session - 03/20/24 0849     Visit Number 1    Number of Visits 30    Date for PT Re-Evaluation 06/12/24    Authorization Type [employer] AND [employer] [place]    Progress Note Due on Visit 10    PT Start Time 0848    PT Stop Time 0935    PT Time Calculation (min) 47 min    Activity Tolerance Patient limited by pain    Behavior During Therapy [facility] for tasks assessed/performed          Past Medical History:  Diagnosis Date   Allergy 01.01.1975   Ambulates with cane    straight cane   Arthritis    Asthma    Bronchitis, obstructive, chronic (HCC)    hx   Cataracts, bilateral    pt has not had surgery to remove as of 02/15/24   Complication of anesthesia    patient states slow to wake up after anesthesia   COPD (chronic obstructive pulmonary disease) (HCC)    Dyspnea    only with exertion   Fibromyalgia    GERD (gastroesophageal reflux disease)    Headache    HLD (hyperlipidemia)    Hypertension    Pneumonia    x several   Past Surgical History:  Procedure Laterality Date   ABDOMINAL HYSTERECTOMY     APPENDECTOMY     BICEPT TENODESIS Left 02/21/2024   Procedure: TENODESIS, BICEPS;  Surgeon: Addie Cordella Hamilton, MD;  Location: [facility] OR;  Service: Orthopedics;  Laterality: Left;   CYST EXCISION     upper right side lateral - benign   HERNIA REPAIR     Umbilical x 3   KNEE ARTHROSCOPY W/ MENISCAL REPAIR Left 12/2022   POSTERIOR LUMBAR FUSION 2 WITH HARDWARE REMOVAL Left 02/21/2024   Procedure: ARTHROSCOPY, SHOULDER WITH DEBRIDEMENT;  Surgeon: Addie Cordella Hamilton, MD;  Location: [facility] OR;  Service: Orthopedics;  Laterality: Left;  left shoulder arthroscopy, debridement, biceps tenodesis, mini open rotator cuff tear repair   SHOULDER OPEN ROTATOR CUFF REPAIR Left 02/21/2024   Procedure: REPAIR,  ROTATOR CUFF, OPEN;  Surgeon: Addie Cordella Hamilton, MD;  Location: [facility] OR;  Service: Orthopedics;  Laterality: Left;   SPINE SURGERY  05/2021   TONSILLECTOMY     TRANSFORAMINAL LUMBAR INTERBODY FUSION (TLIF) WITH PEDICLE SCREW FIXATION 1 LEVEL N/A 05/18/2022   Procedure: Lumbar Four-Five Open Laminectomy/Transforaminal Lumbar Interbody Fusion/Posterolateral fusion;  Surgeon: Cheryle Debby LABOR, MD;  Location: [facility] OR;  Service: Neurosurgery;  Laterality: N/A;   Patient Active Problem List   Diagnosis Date Noted   Synovitis of left shoulder 03/09/2024   Biceps tendonitis on left 03/09/2024   Degenerative superior labral anterior-to-posterior (SLAP) tear of left shoulder 03/09/2024   Complete tear of left rotator cuff 03/09/2024   Obesity 10/13/2023   Shingles outbreak 05/29/2023   Microhematuria 04/09/2023   Bilateral shoulder pain 04/09/2023   Urethritis 10/05/2022   External otitis of left ear 10/05/2022   Spondylolisthesis of lumbar region 05/18/2022   Smoker 04/08/2022   Deviated nasal septum 04/08/2022   Bilateral pain of leg and foot 04/08/2022   Follicular acne 04/06/2022   Baker cyst, left 04/06/2022   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   Depression 10/04/2021   Left knee pain 10/04/2021   Chronic pain  04/09/2021   Pelvic pain 04/09/2021   HLD (hyperlipidemia) 04/09/2021   Vitamin D  deficiency 04/09/2021   Estrogen deficiency 04/09/2021   Hyperglycemia 04/01/2021   Chronic cough 12/30/2020   GERD (gastroesophageal reflux disease) 11/06/2019   Bilateral leg edema 10/16/2019   Pleuritic chest pain 10/16/2019   Hypertension 10/02/2019   Arthralgia 06/28/2018   Sinusitis 06/28/2018   Renal cyst 05/03/2018   Syncope 05/03/2018   Stress and adjustment reaction 05/03/2018   History of tobacco use 05/02/2018   Primary osteoarthritis of both hands 04/10/2018   Primary osteoarthritis of both knees 04/10/2018   Primary osteoarthritis of  both feet 04/10/2018   Achilles tendinitis 04/02/2018   Screening for breast cancer 03/15/2018   Positive ANA (antinuclear antibody) 03/15/2018   Screening for colon cancer 03/15/2018   Rash 03/15/2018   Achilles tendon pain 11/13/2017   Cervical myelopathy with cervical radiculopathy (HCC) 11/13/2017   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   Allergic rhinitis 06/19/2017   Edema 12/24/2015   Carpal tunnel syndrome, right 12/16/2015   Hypercalcemia 11/11/2015   Pain in both upper extremities 11/11/2015   Drug reaction 10/28/2015   Muscle spasms of both lower extremities 10/28/2015   Nerve pain 10/28/2015   Chronic asthmatic bronchitis with acute exacerbation (HCC) 09/06/2015   Environmental and seasonal allergies 07/17/2014    PCP: Lynwood LELON Rush, MD   REFERRING PROVIDER: Cordella Glendia Hutchinson, MD   REFERRING DIAG: [phone] (ICD-10-CM) - S/P rotator cuff repair  THERAPY DIAG:  Chronic left shoulder pain  Stiffness of left shoulder, not elsewhere classified  Muscle weakness (generalized)  Other abnormalities of gait and mobility  Localized edema  Other muscle spasm  Rationale for Evaluation and Treatment: Rehabilitation  ONSET DATE: 02/21/2024 left shoulder arthroscopy with debridement biceps tenodesis rotator cuff tear repair with a 2 x 3 construct  SUBJECTIVE:                                                                                                                                                                                      SUBJECTIVE STATEMENT: I woke up one morning and could barely move my arm. Hand dominance: Right  PERTINENT HISTORY: Patient endorsed having gone through PT and multiple shoulder injection prior to appointment with Dr. Hutchinson, who scheduled and performed her surgery. Patient notes that her original pain started in Feb 2025, though did have a fall down stairs Feb 2024 and is not sure if that is when she injured shoulder as she did not have pain  at that time. Patient used CPM daily and wears sling only in public at this time, but is working on weaning from both. Patient  endorses difficulty with sleeping and can average 5 hours per night when not in so much pain. Patient has undergone knee surgery (2023), back surgery (2024), and also currently has bilat carpal tunnel with n/t in fingers and hands.  PAIN:  Are you having pain? Yes: NPRS scale: 7/10 at rest, 12/10 at worst  Pain location: from Lt cervical spine to Lt elbow  Pain description: all pain descriptors, but most of the time achey Aggravating factors: moving a certain way, reaching, trying to do hair, picking something up   Relieving factors: tylenol  arthritis twice a day, tramadol  as needed (mid morning, before bed), muscle relaxer, arnica herb (gel for bruising and pain), lidocaine  patches, ice    PRECAUTIONS: Shoulder  RED FLAGS: None   WEIGHT BEARING RESTRICTIONS: Yes Lt shoulder   FALLS:  Has patient fallen in last 6 months? Yes. Number of falls 1, slipped in the bath tub   LIVING ENVIRONMENT: Lives with: lives with their family and lives with their son Lives in: House/apartment Stairs: Yes: Internal: 15 steps; on left going up and External: 3 steps; none Has following equipment at home: Single point cane, Quad cane small base, Walker - 2 wheeled, shower chair, Shower bench, and Grab bars  OCCUPATION: Retired; Print production planner   PLOF: Independent and Independent with community mobility with device  PATIENT GOALS: to be able to use my arm again   NEXT MD VISIT: April 16, 2024 with Dr. Addie   OBJECTIVE:  Note: Objective measures were completed at Evaluation unless otherwise noted.  DIAGNOSTIC FINDINGS:    PATIENT SURVEYS :  PSFS: THE PATIENT SPECIFIC FUNCTIONAL SCALE  Place score of 0-10 (0 = unable to perform activity and 10 = able to perform activity at the same level as before injury or problem)  Activity Date: 03/20/2024    Get dressed and putting  shoes on  4    2. Brush and style your hair / showering  1    3. Cook  2    4. Washing dishes  1     5. Clean, sweep, mop  1    Total Score 1.8      Total Score = Sum of activity scores/number of activities  Minimally Detectable Change: 3 points (for single activity); 2 points (for average score)  [facility] (nd). The Patient Specific Functional Scale . Retrieved from [email or web address]   COGNITION: Overall cognitive status: Within functional limits for tasks assessed     SENSATION: Light touch: diminished in fingers secondary to carpal tunnel Carpal tunnel leading to n/t in fingers;   POSTURE: Increased thoracic kyphosis, forward head, and forward rounded shoulders   UPPER EXTREMITY ROM:    ROM Right Eval 03/20/2024 Left Eval 03/20/2024  Shoulder flexion WFL 85 AAROM, highly painful  Shoulder extension    Shoulder abduction WFL 60 AAROM, highly painful  Shoulder adduction    Shoulder internal rotation T8 75 AAROM at 45deg abd in supine  Shoulder external rotation T1 23 AAROM at 45deg abd in supine  Elbow flexion    Elbow extension    Wrist flexion    Wrist extension    Wrist ulnar deviation    Wrist radial deviation    Wrist pronation    Wrist supination    (Blank rows = not tested)  UPPER EXTREMITY MMT: unable to perform on eval due to protocol restrictions  MMT Right Eval 03/20/2024 Left Eval 03/20/2024  Shoulder flexion    Shoulder extension  Shoulder abduction    Shoulder adduction    Shoulder internal rotation    Shoulder external rotation    Middle trapezius    Lower trapezius    Elbow flexion    Elbow extension    Wrist flexion    Wrist extension    Wrist ulnar deviation    Wrist radial deviation    Wrist pronation    Wrist supination    Grip strength (lbs)    (Blank rows = not tested)  SHOULDER SPECIAL TESTS: Did not perform on eval   JOINT MOBILITY TESTING:  Not assessed  on eval   PALPATION:  Not assessed one val                                                                                                                              TREATMENT DATE:  03/20/2024 TherEx:  HEP handout provided with patient not performing activities due to high pain levels. Demonstration and explanation provided.  Vaso:  Medium compression to Lt shoulder for 10 minutes at 34deg; lowered to low compression after 3 minutes  Pillow placed under Lt elbow to support shoulder    PATIENT EDUCATION: Education details: HEP, POC, typical recovery from rotator cuff repair Person educated: Patient and Child(ren) Education method: Explanation, Demonstration, Verbal cues, and Handouts Education comprehension: verbalized understanding  HOME EXERCISE PROGRAM: Access Code: [email or web address] URL: [email or web address] Date: 03/20/2024 Prepared by: Susannah Daring  Exercises - Circular Shoulder Pendulum with Table Support  - 1 x daily - 7 x weekly - 3 sets - 10 reps - Flexion-Extension Shoulder Pendulum with Table Support  - 1 x daily - 7 x weekly - 3 sets - 10 reps - Horizontal Shoulder Pendulum with Table Support  - 1 x daily - 7 x weekly - 3 sets - 10 reps - Seated Scapular Retraction  - 1 x daily - 7 x weekly - 3 sets - 10 reps - Seated AAROM Shoulder Elevation/Depression  - 1 x daily - 7 x weekly - 3 sets - 10 reps  ASSESSMENT:  CLINICAL IMPRESSION: Patient is a 68 y.o. F who was seen today for physical therapy evaluation and treatment for Rt rotator cuff repair with biceps tenodesis with functional mobility deficits, strength deficits, coordination deficits, ROM deficits, and high levels of pain. Patient is mostly limited due to high levels of pain. Patient will benefit from skilled PT to address above noted deficits.    OBJECTIVE IMPAIRMENTS: decreased activity tolerance, decreased coordination, decreased endurance, decreased mobility, decreased ROM, decreased strength,  decreased safety awareness, hypomobility, increased edema, impaired flexibility, impaired sensation, impaired UE functional use, improper body mechanics, postural dysfunction, obesity, and pain.   ACTIVITY LIMITATIONS: carrying, lifting, sleeping, bed mobility, bathing, dressing, reach over head, and hygiene/grooming  PARTICIPATION LIMITATIONS: meal prep, cleaning, driving, community activity, and yard work  PERSONAL FACTORS: Past/current experiences, Time since onset of injury/illness/exacerbation, and 3+ comorbidities: HTN, hyperlipidemia, GERD, fibromyalgia, COPD, dyspnea, cataracts, chronic obstructive  bronchitis, asthma, arthritis, history of sleep apnea are also affecting patient's functional outcome.   REHAB POTENTIAL: Good  CLINICAL DECISION MAKING: Evolving/moderate complexity  EVALUATION COMPLEXITY: Moderate  GOALS: Goals reviewed with patient? Yes  SHORT TERM GOALS: Target date: 04/24/2024  Patient will show compliance with initial HEP. Baseline: Goal status: INITIAL  2.  Patient will report overall pain levels no greater than 5/10. Baseline:  Goal status: INITIAL  3.  Patient will gain full PROM and AAROM of Lt shoulder to improve functional mobility. Baseline:  Goal status: INITIAL  4.  Patient will increase PSFS to at least 3.8 to show a significant improvement to subjective disability rating.  Baseline:  Goal status: INITIAL   LONG TERM GOALS: Target date: 06/12/2024  Patient will be independent with final HEP in order to maintain and progress upon functional gains made within PT. Baseline:  Goal status: INITIAL  2.  Patient will report overall pain levels no greater than 2/10. Baseline:  Goal status: INITIAL  3.   Patient will increase PSFS to at least 5.8 to show a significant improvement to subjective disability rating.  Baseline:  Goal status: INITIAL  4.  Patient will return to full AROM of Lt shoulder in order to improve overall functional  mobility.  Baseline:  Goal status: INITIAL  5.  Patient will show strength in all motions in Lt shoulder to at least 4-/5 in order to show improved biomechanics with functional mobility.  Baseline:  Goal status: INITIAL  6.  Patient will report improvement in overall sleep through the night in order to show improved overall quality of life. Baseline:  Goal status: INITIAL  PLAN: PT FREQUENCY: 2-3x/wk for 6 weeks, 1-2x/wk for 6 weeks   PT DURATION: 12 weeks  PLANNED INTERVENTIONS: 97164- PT Re-evaluation, 97750- Physical Performance Testing, 97110-Therapeutic exercises, 97530- Therapeutic activity, [id number]- Neuromuscular re-education, 97535- Self Care, [id number]- Manual therapy, [id number]- Gait training, [phone]- Canalith repositioning, H9716- Electrical stimulation (unattended), [phone]- Electrical stimulation (manual), [id number]- Vasopneumatic device, [id number]- Ultrasound, [id number]- Traction (mechanical), [id number]- Ionotophoresis 4mg /ml Dexamethasone , [id number] (1-2 muscles), [id number] (3+ muscles)- Dry Needling, Patient/Family education, Balance training, Stair training, Taping, Joint mobilization, Joint manipulation, Spinal manipulation, Spinal mobilization, Scar mobilization, Vestibular training, DME instructions, Cryotherapy, and Moist heat  PLAN FOR NEXT SESSION: reassess HEP (and add to it as necessary), assess joint mobility, discuss overall protocol (and remind of limitations), begin AAROM strengthening with PVC, overall PROM    Susannah Daring, PT, DPT 03/20/24 4:19 PM

## 2024-03-20 ENCOUNTER — Ambulatory Visit

## 2024-03-20 DIAGNOSIS — M62838 Other muscle spasm: Secondary | ICD-10-CM

## 2024-03-20 DIAGNOSIS — G8929 Other chronic pain: Secondary | ICD-10-CM

## 2024-03-20 DIAGNOSIS — R2689 Other abnormalities of gait and mobility: Secondary | ICD-10-CM | POA: Diagnosis not present

## 2024-03-20 DIAGNOSIS — M6281 Muscle weakness (generalized): Secondary | ICD-10-CM | POA: Diagnosis not present

## 2024-03-20 DIAGNOSIS — M25612 Stiffness of left shoulder, not elsewhere classified: Secondary | ICD-10-CM

## 2024-03-20 DIAGNOSIS — M25512 Pain in left shoulder: Secondary | ICD-10-CM | POA: Diagnosis not present

## 2024-03-20 DIAGNOSIS — R600 Localized edema: Secondary | ICD-10-CM

## 2024-03-21 ENCOUNTER — Encounter: Payer: Self-pay | Admitting: Rehabilitative and Restorative Service Providers"

## 2024-03-21 ENCOUNTER — Ambulatory Visit: Admitting: Physical Therapy

## 2024-03-21 DIAGNOSIS — M6281 Muscle weakness (generalized): Secondary | ICD-10-CM

## 2024-03-21 DIAGNOSIS — M62838 Other muscle spasm: Secondary | ICD-10-CM

## 2024-03-21 DIAGNOSIS — M25612 Stiffness of left shoulder, not elsewhere classified: Secondary | ICD-10-CM | POA: Diagnosis not present

## 2024-03-21 DIAGNOSIS — R2689 Other abnormalities of gait and mobility: Secondary | ICD-10-CM | POA: Diagnosis not present

## 2024-03-21 DIAGNOSIS — M25512 Pain in left shoulder: Secondary | ICD-10-CM | POA: Diagnosis not present

## 2024-03-21 DIAGNOSIS — R600 Localized edema: Secondary | ICD-10-CM

## 2024-03-21 DIAGNOSIS — G8929 Other chronic pain: Secondary | ICD-10-CM

## 2024-03-21 NOTE — Therapy (Addendum)
 OUTPATIENT PHYSICAL THERAPY TREATMENT   Patient Name: Frances Nelson MRN: [id number] DOB:[birth year], 68 y.o., female Today's Date: 03/21/2024  END OF SESSION:  PT End of Session - 03/21/24 0935     Visit Number 2    Number of Visits 30    Date for PT Re-Evaluation 06/12/24    Authorization Type [employer] AND [employer] [place]    Progress Note Due on Visit 10    PT Start Time 0932    PT Stop Time 1020    PT Time Calculation (min) 48 min    Activity Tolerance Patient limited by pain    Behavior During Therapy [facility] for tasks assessed/performed          Past Medical History:  Diagnosis Date   Allergy 01.01.1975   Ambulates with cane    straight cane   Arthritis    Asthma    Bronchitis, obstructive, chronic (HCC)    hx   Cataracts, bilateral    pt has not had surgery to remove as of 02/15/24   Complication of anesthesia    patient states slow to wake up after anesthesia   COPD (chronic obstructive pulmonary disease) (HCC)    Dyspnea    only with exertion   Fibromyalgia    GERD (gastroesophageal reflux disease)    Headache    HLD (hyperlipidemia)    Hypertension    Pneumonia    x several   Past Surgical History:  Procedure Laterality Date   ABDOMINAL HYSTERECTOMY     APPENDECTOMY     BICEPT TENODESIS Left 02/21/2024   Procedure: TENODESIS, BICEPS;  Surgeon: Addie Cordella Hamilton, MD;  Location: [facility] OR;  Service: Orthopedics;  Laterality: Left;   CYST EXCISION     upper right side lateral - benign   HERNIA REPAIR     Umbilical x 3   KNEE ARTHROSCOPY W/ MENISCAL REPAIR Left 12/2022   POSTERIOR LUMBAR FUSION 2 WITH HARDWARE REMOVAL Left 02/21/2024   Procedure: ARTHROSCOPY, SHOULDER WITH DEBRIDEMENT;  Surgeon: Addie Cordella Hamilton, MD;  Location: [facility] OR;  Service: Orthopedics;  Laterality: Left;  left shoulder arthroscopy, debridement, biceps tenodesis, mini open rotator cuff tear repair   SHOULDER OPEN ROTATOR CUFF REPAIR Left 02/21/2024   Procedure: REPAIR, ROTATOR CUFF, OPEN;   Surgeon: Addie Cordella Hamilton, MD;  Location: [facility] OR;  Service: Orthopedics;  Laterality: Left;   SPINE SURGERY  05/2021   TONSILLECTOMY     TRANSFORAMINAL LUMBAR INTERBODY FUSION (TLIF) WITH PEDICLE SCREW FIXATION 1 LEVEL N/A 05/18/2022   Procedure: Lumbar Four-Five Open Laminectomy/Transforaminal Lumbar Interbody Fusion/Posterolateral fusion;  Surgeon: Cheryle Debby LABOR, MD;  Location: [facility] OR;  Service: Neurosurgery;  Laterality: N/A;   Patient Active Problem List   Diagnosis Date Noted   Synovitis of left shoulder 03/09/2024   Biceps tendonitis on left 03/09/2024   Degenerative superior labral anterior-to-posterior (SLAP) tear of left shoulder 03/09/2024   Complete tear of left rotator cuff 03/09/2024   Obesity 10/13/2023   Shingles outbreak 05/29/2023   Microhematuria 04/09/2023   Bilateral shoulder pain 04/09/2023   Urethritis 10/05/2022   External otitis of left ear 10/05/2022   Spondylolisthesis of lumbar region 05/18/2022   Smoker 04/08/2022   Deviated nasal septum 04/08/2022   Bilateral pain of leg and foot 04/08/2022   Follicular acne 04/06/2022   Baker cyst, left 04/06/2022   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   Depression 10/04/2021   Left knee pain 10/04/2021   Chronic pain 04/09/2021  Pelvic pain 04/09/2021   HLD (hyperlipidemia) 04/09/2021   Vitamin D  deficiency 04/09/2021   Estrogen deficiency 04/09/2021   Hyperglycemia 04/01/2021   Chronic cough 12/30/2020   GERD (gastroesophageal reflux disease) 11/06/2019   Bilateral leg edema 10/16/2019   Pleuritic chest pain 10/16/2019   Hypertension 10/02/2019   Arthralgia 06/28/2018   Sinusitis 06/28/2018   Renal cyst 05/03/2018   Syncope 05/03/2018   Stress and adjustment reaction 05/03/2018   History of tobacco use 05/02/2018   Primary osteoarthritis of both hands 04/10/2018   Primary osteoarthritis of both knees 04/10/2018   Primary osteoarthritis of both feet 04/10/2018    Achilles tendinitis 04/02/2018   Screening for breast cancer 03/15/2018   Positive ANA (antinuclear antibody) 03/15/2018   Screening for colon cancer 03/15/2018   Rash 03/15/2018   Achilles tendon pain 11/13/2017   Cervical myelopathy with cervical radiculopathy (HCC) 11/13/2017   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   Allergic rhinitis 06/19/2017   Edema 12/24/2015   Carpal tunnel syndrome, right 12/16/2015   Hypercalcemia 11/11/2015   Pain in both upper extremities 11/11/2015   Drug reaction 10/28/2015   Muscle spasms of both lower extremities 10/28/2015   Nerve pain 10/28/2015   Chronic asthmatic bronchitis with acute exacerbation (HCC) 09/06/2015   Environmental and seasonal allergies 07/17/2014    PCP: Lynwood LELON Rush, MD   REFERRING PROVIDER: Cordella Glendia Hutchinson, MD   REFERRING DIAG: [phone] (ICD-10-CM) - S/P rotator cuff repair  THERAPY DIAG:  Chronic left shoulder pain  Stiffness of left shoulder, not elsewhere classified  Muscle weakness (generalized)  Other abnormalities of gait and mobility  Localized edema  Other muscle spasm  Rationale for Evaluation and Treatment: Rehabilitation  ONSET DATE: 02/21/2024 left shoulder arthroscopy with debridement biceps tenodesis rotator cuff tear repair with a 2 x 3 construct  SUBJECTIVE:                                                                                                                                                                                      SUBJECTIVE STATEMENT: Patient reports pain  similar to yesterday and overall no new changes from yesterday.   Hand dominance: Right  PERTINENT HISTORY: Patient endorsed having gone through PT and multiple shoulder injection prior to appointment with Dr. Hutchinson, who scheduled and performed her surgery. Patient notes that her original pain started in Feb 2025, though did have a fall down stairs Feb 2024 and is not sure if that is when she injured shoulder as she did not  have pain at that time. Patient used CPM daily and wears sling only in public at this time, but is working on weaning from  both. Patient endorses difficulty with sleeping and can average 5 hours per night when not in so much pain. Patient has undergone knee surgery (2023), back surgery (2024), and also currently has bilat carpal tunnel with n/t in fingers and hands.  PAIN:   NPRS scale: severe at worst Pain location: Lt shoulder/arm Pain description: all pain descriptors, but most of the time achey Aggravating factors: moving a certain way, reaching, trying to do hair, picking something up   Relieving factors: tylenol  arthritis twice a day, tramadol  as needed (mid morning, before bed), muscle relaxer, arnica herb (gel for bruising and pain), lidocaine  patches, ice    PRECAUTIONS: Shoulder  RED FLAGS: None   WEIGHT BEARING RESTRICTIONS: Yes Lt shoulder   FALLS:  Has patient fallen in last 6 months? Yes. Number of falls 1, slipped in the bath tub   LIVING ENVIRONMENT: Lives with: lives with their family and lives with their son Lives in: House/apartment Stairs: Yes: Internal: 15 steps; on left going up and External: 3 steps; none Has following equipment at home: Single point cane, Quad cane small base, Walker - 2 wheeled, shower chair, Shower bench, and Grab bars  OCCUPATION: Retired; Print production planner   PLOF: Independent and Independent with community mobility with device  PATIENT GOALS: to be able to use my arm again   NEXT MD VISIT: April 16, 2024 with Dr. Addie   OBJECTIVE:  Note: Objective measures were completed at Evaluation unless otherwise noted.  DIAGNOSTIC FINDINGS:    PATIENT SURVEYS :  PSFS: THE PATIENT SPECIFIC FUNCTIONAL SCALE  Place score of 0-10 (0 = unable to perform activity and 10 = able to perform activity at the same level as before injury or problem)  Activity Date: 03/20/2024    Get dressed and putting shoes on  4    2. Brush and style your hair /  showering  1    3. Cook  2    4. Washing dishes  1     5. Clean, sweep, mop  1    Total Score 1.8      Total Score = Sum of activity scores/number of activities  Minimally Detectable Change: 3 points (for single activity); 2 points (for average score)  [facility] (nd). The Patient Specific Functional Scale . Retrieved from [email or web address]   COGNITION: Overall cognitive status: Within functional limits for tasks assessed     SENSATION: Light touch: diminished in fingers secondary to carpal tunnel Carpal tunnel leading to n/t in fingers;   POSTURE: Increased thoracic kyphosis, forward head, and forward rounded shoulders   UPPER EXTREMITY ROM:    ROM Right Eval 03/20/2024 Left Eval 03/20/2024  Shoulder flexion WFL 85 AAROM, highly painful  Shoulder extension    Shoulder abduction WFL 60 AAROM, highly painful  Shoulder adduction    Shoulder internal rotation T8 75 AAROM at 45deg abd in supine  Shoulder external rotation T1 23 AAROM at 45deg abd in supine  Elbow flexion    Elbow extension    Wrist flexion    Wrist extension    Wrist ulnar deviation    Wrist radial deviation    Wrist pronation    Wrist supination    (Blank rows = not tested)  UPPER EXTREMITY MMT: unable to perform on eval due to protocol restrictions  MMT Right Eval 03/20/2024 Left Eval 03/20/2024  Shoulder flexion    Shoulder extension    Shoulder abduction    Shoulder adduction    Shoulder internal  rotation    Shoulder external rotation    Middle trapezius    Lower trapezius    Elbow flexion    Elbow extension    Wrist flexion    Wrist extension    Wrist ulnar deviation    Wrist radial deviation    Wrist pronation    Wrist supination    Grip strength (lbs)    (Blank rows = not tested)  SHOULDER SPECIAL TESTS: Did not perform on eval   JOINT MOBILITY TESTING:  Not assessed on eval   PALPATION:  Not assessed one val      TREATMENT DATE:  03/21/2024 Manual PROM  with distraction of Lt shoulder flexion and abduction ranges  TherEx:  Supine scapular squeezes, 1x10 Supine AAROM shoulder depression/elevation, 1x10 Bicep curls, 1x10 Pendulums: lateral, fwd/bwd, CW, CCW  Vaso:  Low compression to Lt shoulder for 10 minutes at 34deg                                                                                                                          TREATMENT DATE:  03/20/2024 TherEx:  HEP handout provided with patient not performing activities due to high pain levels. Demonstration and explanation provided.  Vaso:  Medium compression to Lt shoulder for 10 minutes at 34deg; lowered to low compression after 3 minutes  Pillow placed under Lt elbow to support shoulder    PATIENT EDUCATION: Education details: HEP, POC, typical recovery from rotator cuff repair Person educated: Patient and Child(ren) Education method: Explanation, Demonstration, Verbal cues, and Handouts Education comprehension: verbalized understanding  HOME EXERCISE PROGRAM: Access Code: [email or web address] URL: [email or web address] Date: 03/20/2024 Prepared by: Susannah Daring  Exercises - Circular Shoulder Pendulum with Table Support  - 1 x daily - 7 x weekly - 3 sets - 10 reps - Flexion-Extension Shoulder Pendulum with Table Support  - 1 x daily - 7 x weekly - 3 sets - 10 reps - Horizontal Shoulder Pendulum with Table Support  - 1 x daily - 7 x weekly - 3 sets - 10 reps - Seated Scapular Retraction  - 1 x daily - 7 x weekly - 3 sets - 10 reps - Seated AAROM Shoulder Elevation/Depression  - 1 x daily - 7 x weekly - 3 sets - 10 reps  ASSESSMENT:  CLINICAL IMPRESSION: Patient tolerated treatment fair. Patient has moderate levels of pain with PROM and occasional muscle spasms with higher ROM or with lowering which impedes maximizing ROM. Additionally, patient has moderate levels of pain with scapular exercises. Patient will  continue to benefit from skilled PT to address impairments and minimize pain.      OBJECTIVE IMPAIRMENTS: decreased activity tolerance, decreased coordination, decreased endurance, decreased mobility, decreased ROM, decreased strength, decreased safety awareness, hypomobility, increased edema, impaired flexibility, impaired sensation, impaired UE functional use, improper body mechanics, postural dysfunction, obesity, and pain.   ACTIVITY LIMITATIONS: carrying, lifting, sleeping, bed mobility, bathing, dressing, reach over head, and hygiene/grooming  PARTICIPATION LIMITATIONS: meal prep,  cleaning, driving, community activity, and yard work  PERSONAL FACTORS: Past/current experiences, Time since onset of injury/illness/exacerbation, and 3+ comorbidities: HTN, hyperlipidemia, GERD, fibromyalgia, COPD, dyspnea, cataracts, chronic obstructive bronchitis, asthma, arthritis, history of sleep apnea are also affecting patient's functional outcome.   REHAB POTENTIAL: Good  CLINICAL DECISION MAKING: Evolving/moderate complexity  EVALUATION COMPLEXITY: Moderate  GOALS: Goals reviewed with patient? Yes  SHORT TERM GOALS: Target date: 04/24/2024  Patient will show compliance with initial HEP. Baseline: Goal status: ongoing, 03/21/2024  2.  Patient will report overall pain levels no greater than 5/10. Baseline:  Goal status: ongoing, 03/21/2024  3.  Patient will gain full PROM and AAROM of Lt shoulder to improve functional mobility. Baseline:  Goal status: ongoing, 03/21/2024  4.  Patient will increase PSFS to at least 3.8 to show a significant improvement to subjective disability rating.  Baseline:  Goal status: ongoing, 03/21/2024   LONG TERM GOALS: Target date: 06/12/2024  Patient will be independent with final HEP in order to maintain and progress upon functional gains made within PT. Baseline:  Goal status: ongoing, 03/21/2024  2.  Patient will report overall pain levels no greater than  2/10. Baseline:  Goal status: ongoing, 03/21/2024  3.   Patient will increase PSFS to at least 5.8 to show a significant improvement to subjective disability rating.  Baseline:  Goal status: ongoing, 03/21/2024  4.  Patient will return to full AROM of Lt shoulder in order to improve overall functional mobility.  Baseline:  Goal status: ongoing, 03/21/2024  5.  Patient will show strength in all motions in Lt shoulder to at least 4-/5 in order to show improved biomechanics with functional mobility.  Baseline:  Goal status: ongoing, 03/21/2024  6.  Patient will report improvement in overall sleep through the night in order to show improved overall quality of life. Baseline:  Goal status: ongoing, 03/21/2024  PLAN: PT FREQUENCY: 2-3x/wk for 6 weeks, 1-2x/wk for 6 weeks   PT DURATION: 12 weeks  PLANNED INTERVENTIONS: 97164- PT Re-evaluation, 97750- Physical Performance Testing, 97110-Therapeutic exercises, 97530- Therapeutic activity, [id number]- Neuromuscular re-education, 97535- Self Care, [id number]- Manual therapy, [id number]- Gait training, [phone]- Canalith repositioning, H9716- Electrical stimulation (unattended), [phone]- Electrical stimulation (manual), [id number]- Vasopneumatic device, [id number]- Ultrasound, [id number]- Traction (mechanical), [id number]- Ionotophoresis 4mg /ml Dexamethasone , [id number] (1-2 muscles), [id number] (3+ muscles)- Dry Needling, Patient/Family education, Balance training, Stair training, Taping, Joint mobilization, Joint manipulation, Spinal manipulation, Spinal mobilization, Scar mobilization, Vestibular training, DME instructions, Cryotherapy, and Moist heat  PLAN FOR NEXT SESSION: ROM progressions as able.   Left shoulder AAROM and PROM for 3 week then ok for strengthening (starting 04/02/24)  Ismael Theophilus Stallion, SPT 03/21/24 10:30 AM

## 2024-03-26 ENCOUNTER — Ambulatory Visit: Admitting: Physical Therapy

## 2024-03-28 NOTE — Therapy (Signed)
 OUTPATIENT PHYSICAL THERAPY TREATMENT   Patient Name: Frances Nelson MRN: [id number] DOB:August 06, 1955, 68 y.o., female Today's Date: 03/31/2024  END OF SESSION:  PT End of Session - 03/31/24 0856     Visit Number 3    Number of Visits 30    Date for PT Re-Evaluation 06/12/24    Authorization Type [employer] AND [employer] [place]    Progress Note Due on Visit 10    PT Start Time 0857    PT Stop Time 0935    PT Time Calculation (min) 38 min    Activity Tolerance Patient limited by pain    Behavior During Therapy [facility] for tasks assessed/performed           Past Medical History:  Diagnosis Date   Allergy 01.01.1975   Ambulates with cane    straight cane   Arthritis    Asthma    Bronchitis, obstructive, chronic (HCC)    hx   Cataracts, bilateral    pt has not had surgery to remove as of 02/15/24   Complication of anesthesia    patient states slow to wake up after anesthesia   COPD (chronic obstructive pulmonary disease) (HCC)    Dyspnea    only with exertion   Fibromyalgia    GERD (gastroesophageal reflux disease)    Headache    HLD (hyperlipidemia)    Hypertension    Pneumonia    x several   Past Surgical History:  Procedure Laterality Date   ABDOMINAL HYSTERECTOMY     APPENDECTOMY     BICEPT TENODESIS Left 02/21/2024   Procedure: TENODESIS, BICEPS;  Surgeon: Addie Cordella Hamilton, MD;  Location: [facility] OR;  Service: Orthopedics;  Laterality: Left;   CYST EXCISION     upper right side lateral - benign   HERNIA REPAIR     Umbilical x 3   KNEE ARTHROSCOPY W/ MENISCAL REPAIR Left 12/2022   POSTERIOR LUMBAR FUSION 2 WITH HARDWARE REMOVAL Left 02/21/2024   Procedure: ARTHROSCOPY, SHOULDER WITH DEBRIDEMENT;  Surgeon: Addie Cordella Hamilton, MD;  Location: [facility] OR;  Service: Orthopedics;  Laterality: Left;  left shoulder arthroscopy, debridement, biceps tenodesis, mini open rotator cuff tear repair   SHOULDER OPEN ROTATOR CUFF REPAIR Left 02/21/2024   Procedure: REPAIR, ROTATOR CUFF,  OPEN;  Surgeon: Addie Cordella Hamilton, MD;  Location: [facility] OR;  Service: Orthopedics;  Laterality: Left;   SPINE SURGERY  05/2021   TONSILLECTOMY     TRANSFORAMINAL LUMBAR INTERBODY FUSION (TLIF) WITH PEDICLE SCREW FIXATION 1 LEVEL N/A 05/18/2022   Procedure: Lumbar Four-Five Open Laminectomy/Transforaminal Lumbar Interbody Fusion/Posterolateral fusion;  Surgeon: Cheryle Debby LABOR, MD;  Location: [facility] OR;  Service: Neurosurgery;  Laterality: N/A;   Patient Active Problem List   Diagnosis Date Noted   Synovitis of left shoulder 03/09/2024   Biceps tendonitis on left 03/09/2024   Degenerative superior labral anterior-to-posterior (SLAP) tear of left shoulder 03/09/2024   Complete tear of left rotator cuff 03/09/2024   Obesity 10/13/2023   Shingles outbreak 05/29/2023   Microhematuria 04/09/2023   Bilateral shoulder pain 04/09/2023   Urethritis 10/05/2022   External otitis of left ear 10/05/2022   Spondylolisthesis of lumbar region 05/18/2022   Smoker 04/08/2022   Deviated nasal septum 04/08/2022   Bilateral pain of leg and foot 04/08/2022   Follicular acne 04/06/2022   Baker cyst, left 04/06/2022   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   Depression 10/04/2021   Left knee pain 10/04/2021   Chronic pain 04/09/2021  Pelvic pain 04/09/2021   HLD (hyperlipidemia) 04/09/2021   Vitamin D  deficiency 04/09/2021   Estrogen deficiency 04/09/2021   Hyperglycemia 04/01/2021   Chronic cough 12/30/2020   GERD (gastroesophageal reflux disease) 11/06/2019   Bilateral leg edema 10/16/2019   Pleuritic chest pain 10/16/2019   Hypertension 10/02/2019   Arthralgia 06/28/2018   Sinusitis 06/28/2018   Renal cyst 05/03/2018   Syncope 05/03/2018   Stress and adjustment reaction 05/03/2018   History of tobacco use 05/02/2018   Primary osteoarthritis of both hands 04/10/2018   Primary osteoarthritis of both knees 04/10/2018   Primary osteoarthritis of both feet  04/10/2018   Achilles tendinitis 04/02/2018   Screening for breast cancer 03/15/2018   Positive ANA (antinuclear antibody) 03/15/2018   Screening for colon cancer 03/15/2018   Rash 03/15/2018   Achilles tendon pain 11/13/2017   Cervical myelopathy with cervical radiculopathy (HCC) 11/13/2017   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   Allergic rhinitis 06/19/2017   Edema 12/24/2015   Carpal tunnel syndrome, right 12/16/2015   Hypercalcemia 11/11/2015   Pain in both upper extremities 11/11/2015   Drug reaction 10/28/2015   Muscle spasms of both lower extremities 10/28/2015   Nerve pain 10/28/2015   Chronic asthmatic bronchitis with acute exacerbation (HCC) 09/06/2015   Environmental and seasonal allergies 07/17/2014    PCP: Lynwood LELON Rush, MD   REFERRING PROVIDER: Cordella Glendia Hutchinson, MD   REFERRING DIAG: [phone] (ICD-10-CM) - S/P rotator cuff repair  THERAPY DIAG:  Chronic left shoulder pain  Stiffness of left shoulder, not elsewhere classified  Muscle weakness (generalized)  Other abnormalities of gait and mobility  Localized edema  Other muscle spasm  Rationale for Evaluation and Treatment: Rehabilitation  ONSET DATE: 02/21/2024 left shoulder arthroscopy with debridement biceps tenodesis rotator cuff tear repair with a 2 x 3 construct  SUBJECTIVE:                                                                                                                                                                                      SUBJECTIVE STATEMENT: Patient rating pain around 7-8/10 this morning and feeling extra sore especially from driving.   Hand dominance: Right  PERTINENT HISTORY: Patient endorsed having gone through PT and multiple shoulder injection prior to appointment with Dr. Hutchinson, who scheduled and performed her surgery. Patient notes that her original pain started in Feb 2025, though did have a fall down stairs Feb 2024 and is not sure if that is when she injured  shoulder as she did not have pain at that time. Patient used CPM daily and wears sling only in public at this time, but is working on weaning from  both. Patient endorses difficulty with sleeping and can average 5 hours per night when not in so much pain. Patient has undergone knee surgery (2023), back surgery (2024), and also currently has bilat carpal tunnel with n/t in fingers and hands.  PAIN:   NPRS scale: severe at worst Pain location: Lt shoulder/arm Pain description: all pain descriptors, but most of the time achey Aggravating factors: moving a certain way, reaching, trying to do hair, picking something up   Relieving factors: tylenol  arthritis twice a day, tramadol  as needed (mid morning, before bed), muscle relaxer, arnica herb (gel for bruising and pain), lidocaine  patches, ice    PRECAUTIONS: Shoulder  RED FLAGS: None   WEIGHT BEARING RESTRICTIONS: Yes Lt shoulder   FALLS:  Has patient fallen in last 6 months? Yes. Number of falls 1, slipped in the bath tub   LIVING ENVIRONMENT: Lives with: lives with their family and lives with their son Lives in: House/apartment Stairs: Yes: Internal: 15 steps; on left going up and External: 3 steps; none Has following equipment at home: Single point cane, Quad cane small base, Walker - 2 wheeled, shower chair, Shower bench, and Grab bars  OCCUPATION: Retired; Print production planner   PLOF: Independent and Independent with community mobility with device  PATIENT GOALS: to be able to use my arm again   NEXT MD VISIT: April 16, 2024 with Dr. Addie   OBJECTIVE:  Note: Objective measures were completed at Evaluation unless otherwise noted.  DIAGNOSTIC FINDINGS:    PATIENT SURVEYS :  PSFS: THE PATIENT SPECIFIC FUNCTIONAL SCALE  Place score of 0-10 (0 = unable to perform activity and 10 = able to perform activity at the same level as before injury or problem)  Activity Date: 03/20/2024    Get dressed and putting shoes on  4    2. Brush and  style your hair / showering  1    3. Cook  2    4. Washing dishes  1     5. Clean, sweep, mop  1    Total Score 1.8      Total Score = Sum of activity scores/number of activities  Minimally Detectable Change: 3 points (for single activity); 2 points (for average score)  [facility] (nd). The Patient Specific Functional Scale . Retrieved from [email or web address]   COGNITION: Overall cognitive status: Within functional limits for tasks assessed     SENSATION: Light touch: diminished in fingers secondary to carpal tunnel Carpal tunnel leading to n/t in fingers;   POSTURE: Increased thoracic kyphosis, forward head, and forward rounded shoulders   UPPER EXTREMITY ROM:    ROM Right Eval 03/20/2024 Left Eval 03/20/2024  Shoulder flexion WFL 85 AAROM, highly painful  Shoulder extension    Shoulder abduction WFL 60 AAROM, highly painful  Shoulder adduction    Shoulder internal rotation T8 75 AAROM at 45deg abd in supine  Shoulder external rotation T1 23 AAROM at 45deg abd in supine  Elbow flexion    Elbow extension    Wrist flexion    Wrist extension    Wrist ulnar deviation    Wrist radial deviation    Wrist pronation    Wrist supination    (Blank rows = not tested)  UPPER EXTREMITY MMT: unable to perform on eval due to protocol restrictions  MMT Right Eval 03/20/2024 Left Eval 03/20/2024  Shoulder flexion    Shoulder extension    Shoulder abduction    Shoulder adduction    Shoulder internal  rotation    Shoulder external rotation    Middle trapezius    Lower trapezius    Elbow flexion    Elbow extension    Wrist flexion    Wrist extension    Wrist ulnar deviation    Wrist radial deviation    Wrist pronation    Wrist supination    Grip strength (lbs)    (Blank rows = not tested)  SHOULDER SPECIAL TESTS: Did not perform on eval   JOINT MOBILITY TESTING:  Not assessed on eval   PALPATION:  Not  assessed one val     TREATMENT DATE:  03/31/2024 Manual:  PROM with distraction for shoulder flexion, abduction, and scaption   TherEx: Supine AAROM with PVC for shoulder flexion 2x8 Seated scap squeezes 1x10  Seated UT stretch 2x30s each side  Shoulder circles 1x10 fwd, 1x10 back   Vaso:  Medium compression to Lt shoulder for 10 minutes at 34deg   03/21/2024 Manual PROM  with distraction of Lt shoulder flexion and abduction ranges  TherEx:  Supine scapular squeezes, 1x10 Supine AAROM shoulder depression/elevation, 1x10 Bicep curls, 1x10 Pendulums: lateral, fwd/bwd, CW, CCW  Vaso:  Low compression to Lt shoulder for 10 minutes at 34deg                                                                                                                          TREATMENT DATE:  03/20/2024 TherEx:  HEP handout provided with patient not performing activities due to high pain levels. Demonstration and explanation provided.  Vaso:  Medium compression to Lt shoulder for 10 minutes at 34deg; lowered to low compression after 3 minutes  Pillow placed under Lt elbow to support shoulder    PATIENT EDUCATION: Education details: HEP, POC, typical recovery from rotator cuff repair Person educated: Patient and Child(ren) Education method: Explanation, Demonstration, Verbal cues, and Handouts Education comprehension: verbalized understanding  HOME EXERCISE PROGRAM: Access Code: [email or web address] URL: [email or web address] Date: 03/20/2024 Prepared by: Susannah Daring  Exercises - Circular Shoulder Pendulum with Table Support  - 1 x daily - 7 x weekly - 3 sets - 10 reps - Flexion-Extension Shoulder Pendulum with Table Support  - 1 x daily - 7 x weekly - 3 sets - 10 reps - Horizontal Shoulder Pendulum with Table Support  - 1 x daily - 7 x weekly - 3 sets - 10 reps - Seated Scapular Retraction  - 1 x daily - 7 x weekly - 3 sets - 10 reps - Seated AAROM Shoulder Elevation/Depression  - 1 x  daily - 7 x weekly - 3 sets - 10 reps  ASSESSMENT:  CLINICAL IMPRESSION:  Patient arrived to session late noting today being her first day driving herself. Patient noting she has been having increased pain and continued difficulty with sleeping. Patient with improved tolerance for PROM this date, though still having deficits secondary to pain with eccentric motion and ROM deficits. Patient will continue to benefit from skilled PT.  OBJECTIVE IMPAIRMENTS: decreased activity tolerance, decreased coordination, decreased endurance, decreased mobility, decreased ROM, decreased strength, decreased safety awareness, hypomobility, increased edema, impaired flexibility, impaired sensation, impaired UE functional use, improper body mechanics, postural dysfunction, obesity, and pain.   ACTIVITY LIMITATIONS: carrying, lifting, sleeping, bed mobility, bathing, dressing, reach over head, and hygiene/grooming  PARTICIPATION LIMITATIONS: meal prep, cleaning, driving, community activity, and yard work  PERSONAL FACTORS: Past/current experiences, Time since onset of injury/illness/exacerbation, and 3+ comorbidities: HTN, hyperlipidemia, GERD, fibromyalgia, COPD, dyspnea, cataracts, chronic obstructive bronchitis, asthma, arthritis, history of sleep apnea are also affecting patient's functional outcome.   REHAB POTENTIAL: Good  CLINICAL DECISION MAKING: Evolving/moderate complexity  EVALUATION COMPLEXITY: Moderate  GOALS: Goals reviewed with patient? Yes  SHORT TERM GOALS: Target date: 04/24/2024  Patient will show compliance with initial HEP. Baseline: Goal status: ongoing, 03/21/2024  2.  Patient will report overall pain levels no greater than 5/10. Baseline:  Goal status: ongoing, 03/21/2024  3.  Patient will gain full PROM and AAROM of Lt shoulder to improve functional mobility. Baseline:  Goal status: ongoing, 03/21/2024  4.  Patient will increase PSFS to at least 3.8 to show a significant  improvement to subjective disability rating.  Baseline:  Goal status: ongoing, 03/21/2024   LONG TERM GOALS: Target date: 06/12/2024  Patient will be independent with final HEP in order to maintain and progress upon functional gains made within PT. Baseline:  Goal status: ongoing, 03/21/2024  2.  Patient will report overall pain levels no greater than 2/10. Baseline:  Goal status: ongoing, 03/21/2024  3.   Patient will increase PSFS to at least 5.8 to show a significant improvement to subjective disability rating.  Baseline:  Goal status: ongoing, 03/21/2024  4.  Patient will return to full AROM of Lt shoulder in order to improve overall functional mobility.  Baseline:  Goal status: ongoing, 03/21/2024  5.  Patient will show strength in all motions in Lt shoulder to at least 4-/5 in order to show improved biomechanics with functional mobility.  Baseline:  Goal status: ongoing, 03/21/2024  6.  Patient will report improvement in overall sleep through the night in order to show improved overall quality of life. Baseline:  Goal status: ongoing, 03/21/2024  PLAN: PT FREQUENCY: 2-3x/wk for 6 weeks, 1-2x/wk for 6 weeks   PT DURATION: 12 weeks  PLANNED INTERVENTIONS: 97164- PT Re-evaluation, 97750- Physical Performance Testing, 97110-Therapeutic exercises, 97530- Therapeutic activity, [id number]- Neuromuscular re-education, 97535- Self Care, [id number]- Manual therapy, [id number]- Gait training, [phone]- Canalith repositioning, H9716- Electrical stimulation (unattended), [phone]- Electrical stimulation (manual), [id number]- Vasopneumatic device, [id number]- Ultrasound, [id number]- Traction (mechanical), [id number]- Ionotophoresis 4mg /ml Dexamethasone , 79439 (1-2 muscles), 20561 (3+ muscles)- Dry Needling, Patient/Family education, Balance training, Stair training, Taping, Joint mobilization, Joint manipulation, Spinal manipulation, Spinal mobilization, Scar mobilization, Vestibular training, DME instructions, Cryotherapy, and Moist  heat  PLAN FOR NEXT SESSION: ROM progressions as able.   Left shoulder AAROM and PROM for 3 week then ok for strengthening (starting 04/02/24)  Susannah Daring, PT, DPT 03/31/24 9:46 AM

## 2024-03-31 ENCOUNTER — Ambulatory Visit (INDEPENDENT_AMBULATORY_CARE_PROVIDER_SITE_OTHER)

## 2024-03-31 DIAGNOSIS — M25512 Pain in left shoulder: Secondary | ICD-10-CM | POA: Diagnosis not present

## 2024-03-31 DIAGNOSIS — R2689 Other abnormalities of gait and mobility: Secondary | ICD-10-CM

## 2024-03-31 DIAGNOSIS — M6281 Muscle weakness (generalized): Secondary | ICD-10-CM | POA: Diagnosis not present

## 2024-03-31 DIAGNOSIS — G8929 Other chronic pain: Secondary | ICD-10-CM

## 2024-03-31 DIAGNOSIS — M25612 Stiffness of left shoulder, not elsewhere classified: Secondary | ICD-10-CM | POA: Diagnosis not present

## 2024-03-31 DIAGNOSIS — R600 Localized edema: Secondary | ICD-10-CM

## 2024-03-31 DIAGNOSIS — M62838 Other muscle spasm: Secondary | ICD-10-CM

## 2024-04-01 NOTE — Therapy (Signed)
 OUTPATIENT PHYSICAL THERAPY TREATMENT   Patient Name: Frances Nelson MRN: [id number] DOB:1956/02/17, 68 y.o., female Today's Date: 04/02/2024  END OF SESSION:  PT End of Session - 04/02/24 1154     Visit Number 4    Number of Visits 30    Date for PT Re-Evaluation 06/12/24    Authorization Type [employer] AND [employer] [place]    Progress Note Due on Visit 10    PT Start Time 1150    PT Stop Time 1238    PT Time Calculation (min) 48 min    Activity Tolerance Patient limited by pain    Behavior During Therapy [facility] for tasks assessed/performed            Past Medical History:  Diagnosis Date   Allergy 01.01.1975   Ambulates with cane    straight cane   Arthritis    Asthma    Bronchitis, obstructive, chronic (HCC)    hx   Cataracts, bilateral    pt has not had surgery to remove as of 02/15/24   Complication of anesthesia    patient states slow to wake up after anesthesia   COPD (chronic obstructive pulmonary disease) (HCC)    Dyspnea    only with exertion   Fibromyalgia    GERD (gastroesophageal reflux disease)    Headache    HLD (hyperlipidemia)    Hypertension    Pneumonia    x several   Past Surgical History:  Procedure Laterality Date   ABDOMINAL HYSTERECTOMY     APPENDECTOMY     BICEPT TENODESIS Left 02/21/2024   Procedure: TENODESIS, BICEPS;  Surgeon: Addie Cordella Hamilton, MD;  Location: [facility] OR;  Service: Orthopedics;  Laterality: Left;   CYST EXCISION     upper right side lateral - benign   HERNIA REPAIR     Umbilical x 3   KNEE ARTHROSCOPY W/ MENISCAL REPAIR Left 12/2022   POSTERIOR LUMBAR FUSION 2 WITH HARDWARE REMOVAL Left 02/21/2024   Procedure: ARTHROSCOPY, SHOULDER WITH DEBRIDEMENT;  Surgeon: Addie Cordella Hamilton, MD;  Location: [facility] OR;  Service: Orthopedics;  Laterality: Left;  left shoulder arthroscopy, debridement, biceps tenodesis, mini open rotator cuff tear repair   SHOULDER OPEN ROTATOR CUFF REPAIR Left 02/21/2024   Procedure: REPAIR, ROTATOR CUFF,  OPEN;  Surgeon: Addie Cordella Hamilton, MD;  Location: [facility] OR;  Service: Orthopedics;  Laterality: Left;   SPINE SURGERY  05/2021   TONSILLECTOMY     TRANSFORAMINAL LUMBAR INTERBODY FUSION (TLIF) WITH PEDICLE SCREW FIXATION 1 LEVEL N/A 05/18/2022   Procedure: Lumbar Four-Five Open Laminectomy/Transforaminal Lumbar Interbody Fusion/Posterolateral fusion;  Surgeon: Cheryle Debby LABOR, MD;  Location: [facility] OR;  Service: Neurosurgery;  Laterality: N/A;   Patient Active Problem List   Diagnosis Date Noted   Synovitis of left shoulder 03/09/2024   Biceps tendonitis on left 03/09/2024   Degenerative superior labral anterior-to-posterior (SLAP) tear of left shoulder 03/09/2024   Complete tear of left rotator cuff 03/09/2024   Obesity 10/13/2023   Shingles outbreak 05/29/2023   Microhematuria 04/09/2023   Bilateral shoulder pain 04/09/2023   Urethritis 10/05/2022   External otitis of left ear 10/05/2022   Spondylolisthesis of lumbar region 05/18/2022   Smoker 04/08/2022   Deviated nasal septum 04/08/2022   Bilateral pain of leg and foot 04/08/2022   Follicular acne 04/06/2022   Baker cyst, left 04/06/2022   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   Depression 10/04/2021   Left knee pain 10/04/2021   Chronic pain  04/09/2021   Pelvic pain 04/09/2021   HLD (hyperlipidemia) 04/09/2021   Vitamin D  deficiency 04/09/2021   Estrogen deficiency 04/09/2021   Hyperglycemia 04/01/2021   Chronic cough 12/30/2020   GERD (gastroesophageal reflux disease) 11/06/2019   Bilateral leg edema 10/16/2019   Pleuritic chest pain 10/16/2019   Hypertension 10/02/2019   Arthralgia 06/28/2018   Sinusitis 06/28/2018   Renal cyst 05/03/2018   Syncope 05/03/2018   Stress and adjustment reaction 05/03/2018   History of tobacco use 05/02/2018   Primary osteoarthritis of both hands 04/10/2018   Primary osteoarthritis of both knees 04/10/2018   Primary osteoarthritis of both feet  04/10/2018   Achilles tendinitis 04/02/2018   Screening for breast cancer 03/15/2018   Positive ANA (antinuclear antibody) 03/15/2018   Screening for colon cancer 03/15/2018   Rash 03/15/2018   Achilles tendon pain 11/13/2017   Cervical myelopathy with cervical radiculopathy (HCC) 11/13/2017   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   Allergic rhinitis 06/19/2017   Edema 12/24/2015   Carpal tunnel syndrome, right 12/16/2015   Hypercalcemia 11/11/2015   Pain in both upper extremities 11/11/2015   Drug reaction 10/28/2015   Muscle spasms of both lower extremities 10/28/2015   Nerve pain 10/28/2015   Chronic asthmatic bronchitis with acute exacerbation (HCC) 09/06/2015   Environmental and seasonal allergies 07/17/2014    PCP: Lynwood LELON Rush, MD   REFERRING PROVIDER: Cordella Glendia Hutchinson, MD   REFERRING DIAG: [id number] (ICD-10-CM) - S/P rotator cuff repair  THERAPY DIAG:  Chronic left shoulder pain  Stiffness of left shoulder, not elsewhere classified  Muscle weakness (generalized)  Other abnormalities of gait and mobility  Localized edema  Other muscle spasm  Rationale for Evaluation and Treatment: Rehabilitation  ONSET DATE: 02/21/2024 left shoulder arthroscopy with debridement biceps tenodesis rotator cuff tear repair with a 2 x 3 construct  SUBJECTIVE:                                                                                                                                                                                      SUBJECTIVE STATEMENT: Patient noting no change in symptoms with pain rated 7/10 this date.    Hand dominance: Right  PERTINENT HISTORY: Patient endorsed having gone through PT and multiple shoulder injection prior to appointment with Dr. Hutchinson, who scheduled and performed her surgery. Patient notes that her original pain started in Feb 2025, though did have a fall down stairs Feb 2024 and is not sure if that is when she injured shoulder as she did not  have pain at that time. Patient used CPM daily and wears sling only in public at this time, but is working on  weaning from both. Patient endorses difficulty with sleeping and can average 5 hours per night when not in so much pain. Patient has undergone knee surgery (2023), back surgery (2024), and also currently has bilat carpal tunnel with n/t in fingers and hands.  PAIN:   NPRS scale: severe at worst Pain location: Lt shoulder/arm Pain description: all pain descriptors, but most of the time achey Aggravating factors: moving a certain way, reaching, trying to do hair, picking something up   Relieving factors: tylenol  arthritis twice a day, tramadol  as needed (mid morning, before bed), muscle relaxer, arnica herb (gel for bruising and pain), lidocaine  patches, ice    PRECAUTIONS: Shoulder  RED FLAGS: None   WEIGHT BEARING RESTRICTIONS: Yes Lt shoulder   FALLS:  Has patient fallen in last 6 months? Yes. Number of falls 1, slipped in the bath tub   LIVING ENVIRONMENT: Lives with: lives with their family and lives with their son Lives in: House/apartment Stairs: Yes: Internal: 15 steps; on left going up and External: 3 steps; none Has following equipment at home: Single point cane, Quad cane small base, Walker - 2 wheeled, shower chair, Shower bench, and Grab bars  OCCUPATION: Retired; Print production planner   PLOF: Independent and Independent with community mobility with device  PATIENT GOALS: to be able to use my arm again   NEXT MD VISIT: April 16, 2024 with Dr. Addie   OBJECTIVE:  Note: Objective measures were completed at Evaluation unless otherwise noted.  DIAGNOSTIC FINDINGS:    PATIENT SURVEYS :  PSFS: THE PATIENT SPECIFIC FUNCTIONAL SCALE  Place score of 0-10 (0 = unable to perform activity and 10 = able to perform activity at the same level as before injury or problem)  Activity Date: 03/20/2024    Get dressed and putting shoes on  4    2. Brush and style your hair /  showering  1    3. Cook  2    4. Washing dishes  1     5. Clean, sweep, mop  1    Total Score 1.8      Total Score = Sum of activity scores/number of activities  Minimally Detectable Change: 3 points (for single activity); 2 points (for average score)  [facility] (nd). The Patient Specific Functional Scale . Retrieved from [email or web address]   COGNITION: Overall cognitive status: Within functional limits for tasks assessed     SENSATION: Light touch: diminished in fingers secondary to carpal tunnel Carpal tunnel leading to n/t in fingers;   POSTURE: Increased thoracic kyphosis, forward head, and forward rounded shoulders   UPPER EXTREMITY ROM:    ROM Right Eval 03/20/2024 Left Eval 03/20/2024  Shoulder flexion WFL 85 AAROM, highly painful  Shoulder extension    Shoulder abduction WFL 60 AAROM, highly painful  Shoulder adduction    Shoulder internal rotation T8 75 AAROM at 45deg abd in supine  Shoulder external rotation T1 23 AAROM at 45deg abd in supine  Elbow flexion    Elbow extension    Wrist flexion    Wrist extension    Wrist ulnar deviation    Wrist radial deviation    Wrist pronation    Wrist supination    (Blank rows = not tested)  UPPER EXTREMITY MMT: unable to perform on eval due to protocol restrictions  MMT Right Eval 03/20/2024 Left Eval 03/20/2024  Shoulder flexion    Shoulder extension    Shoulder abduction    Shoulder adduction  Shoulder internal rotation    Shoulder external rotation    Middle trapezius    Lower trapezius    Elbow flexion    Elbow extension    Wrist flexion    Wrist extension    Wrist ulnar deviation    Wrist radial deviation    Wrist pronation    Wrist supination    Grip strength (lbs)    (Blank rows = not tested)  SHOULDER SPECIAL TESTS: Did not perform on eval   JOINT MOBILITY TESTING:  Not assessed on eval   PALPATION:  Not assessed one val      TREATMENT DATE:  04/02/2024 Manual:  PROM with distraction for shoulder flexion and abduction   TherEx: Supine AAROM chest press with 1# bar 2x6  Seated AAROM ER with 1# bar 1x10 with 3s hold  Isometrics in doorway to begin shoulder strengthening  1x12 with 2-3s hold in flexion, abduction, extension, IR, ER  Intermittent verbal cues for appropriate form   Vaso:  Medium compression to Lt shoulder for 10 minutes at 34deg   03/31/2024 Manual:  PROM with distraction for shoulder flexion, abduction, and scaption   TherEx: Supine AAROM with PVC for shoulder flexion 2x8 Seated scap squeezes 1x10  Seated UT stretch 2x30s each side  Shoulder circles 1x10 fwd, 1x10 back   Vaso:  Medium compression to Lt shoulder for 10 minutes at 34deg   03/21/2024 Manual PROM  with distraction of Lt shoulder flexion and abduction ranges  TherEx:  Supine scapular squeezes, 1x10 Supine AAROM shoulder depression/elevation, 1x10 Bicep curls, 1x10 Pendulums: lateral, fwd/bwd, CW, CCW  Vaso:  Low compression to Lt shoulder for 10 minutes at 34deg                                                                                                                          TREATMENT DATE:  03/20/2024 TherEx:  HEP handout provided with patient not performing activities due to high pain levels. Demonstration and explanation provided.  Vaso:  Medium compression to Lt shoulder for 10 minutes at 34deg; lowered to low compression after 3 minutes  Pillow placed under Lt elbow to support shoulder    PATIENT EDUCATION: Education details: HEP, POC, typical recovery from rotator cuff repair Person educated: Patient and Child(ren) Education method: Explanation, Demonstration, Verbal cues, and Handouts Education comprehension: verbalized understanding  HOME EXERCISE PROGRAM: Access Code: [email or web address] URL: [email or web address] Date: 03/20/2024 Prepared by: Susannah Daring  Exercises - Circular  Shoulder Pendulum with Table Support  - 1 x daily - 7 x weekly - 3 sets - 10 reps - Flexion-Extension Shoulder Pendulum with Table Support  - 1 x daily - 7 x weekly - 3 sets - 10 reps - Horizontal Shoulder Pendulum with Table Support  - 1 x daily - 7 x weekly - 3 sets - 10 reps - Seated Scapular Retraction  - 1 x daily - 7 x weekly - 3 sets - 10 reps - Seated AAROM Shoulder  Elevation/Depression  - 1 x daily - 7 x weekly - 3 sets - 10 reps  ASSESSMENT:  CLINICAL IMPRESSION:  Patient arrived to session noting no change in symptoms with continued pain/soreness rating around 7/10 consistently. Patient tolerated all activities this date and was able to reach over 90deg in PROM abduction. Patient performed shoulder isometric strengthening with intermittent verbal cues for appropriate form. Patient will continue to benefit from skilled PT.    OBJECTIVE IMPAIRMENTS: decreased activity tolerance, decreased coordination, decreased endurance, decreased mobility, decreased ROM, decreased strength, decreased safety awareness, hypomobility, increased edema, impaired flexibility, impaired sensation, impaired UE functional use, improper body mechanics, postural dysfunction, obesity, and pain.   ACTIVITY LIMITATIONS: carrying, lifting, sleeping, bed mobility, bathing, dressing, reach over head, and hygiene/grooming  PARTICIPATION LIMITATIONS: meal prep, cleaning, driving, community activity, and yard work  PERSONAL FACTORS: Past/current experiences, Time since onset of injury/illness/exacerbation, and 3+ comorbidities: HTN, hyperlipidemia, GERD, fibromyalgia, COPD, dyspnea, cataracts, chronic obstructive bronchitis, asthma, arthritis, history of sleep apnea are also affecting patient's functional outcome.   REHAB POTENTIAL: Good  CLINICAL DECISION MAKING: Evolving/moderate complexity  EVALUATION COMPLEXITY: Moderate  GOALS: Goals reviewed with patient? Yes  SHORT TERM GOALS: Target date:  04/24/2024  Patient will show compliance with initial HEP. Baseline: Goal status: ongoing, 03/21/2024  2.  Patient will report overall pain levels no greater than 5/10. Baseline:  Goal status: ongoing, 03/21/2024  3.  Patient will gain full PROM and AAROM of Lt shoulder to improve functional mobility. Baseline:  Goal status: ongoing, 03/21/2024  4.  Patient will increase PSFS to at least 3.8 to show a significant improvement to subjective disability rating.  Baseline:  Goal status: ongoing, 03/21/2024   LONG TERM GOALS: Target date: 06/12/2024  Patient will be independent with final HEP in order to maintain and progress upon functional gains made within PT. Baseline:  Goal status: ongoing, 03/21/2024  2.  Patient will report overall pain levels no greater than 2/10. Baseline:  Goal status: ongoing, 03/21/2024  3.   Patient will increase PSFS to at least 5.8 to show a significant improvement to subjective disability rating.  Baseline:  Goal status: ongoing, 03/21/2024  4.  Patient will return to full AROM of Lt shoulder in order to improve overall functional mobility.  Baseline:  Goal status: ongoing, 03/21/2024  5.  Patient will show strength in all motions in Lt shoulder to at least 4-/5 in order to show improved biomechanics with functional mobility.  Baseline:  Goal status: ongoing, 03/21/2024  6.  Patient will report improvement in overall sleep through the night in order to show improved overall quality of life. Baseline:  Goal status: ongoing, 03/21/2024  PLAN: PT FREQUENCY: 2-3x/wk for 6 weeks, 1-2x/wk for 6 weeks   PT DURATION: 12 weeks  PLANNED INTERVENTIONS: 97164- PT Re-evaluation, 97750- Physical Performance Testing, 97110-Therapeutic exercises, 97530- Therapeutic activity, [id number]- Neuromuscular re-education, 97535- Self Care, [id number]- Manual therapy, [id number]- Gait training, [phone]- Canalith repositioning, H9716- Electrical stimulation (unattended), [phone]- Electrical stimulation  (manual), [id number]- Vasopneumatic device, [id number]- Ultrasound, [id number]- Traction (mechanical), [id number]- Ionotophoresis 4mg /ml Dexamethasone , 79439 (1-2 muscles), 20561 (3+ muscles)- Dry Needling, Patient/Family education, Balance training, Stair training, Taping, Joint mobilization, Joint manipulation, Spinal manipulation, Spinal mobilization, Scar mobilization, Vestibular training, DME instructions, Cryotherapy, and Moist heat  PLAN FOR NEXT SESSION:  ROM and strength progressions as able   Left shoulder AAROM and PROM for 3 week then ok for strengthening (starting 04/02/24)  Susannah Daring, PT, DPT 04/02/24 12:48 PM

## 2024-04-02 ENCOUNTER — Ambulatory Visit (INDEPENDENT_AMBULATORY_CARE_PROVIDER_SITE_OTHER)

## 2024-04-02 DIAGNOSIS — R600 Localized edema: Secondary | ICD-10-CM

## 2024-04-02 DIAGNOSIS — M25512 Pain in left shoulder: Secondary | ICD-10-CM | POA: Diagnosis not present

## 2024-04-02 DIAGNOSIS — M6281 Muscle weakness (generalized): Secondary | ICD-10-CM

## 2024-04-02 DIAGNOSIS — G8929 Other chronic pain: Secondary | ICD-10-CM

## 2024-04-02 DIAGNOSIS — M25612 Stiffness of left shoulder, not elsewhere classified: Secondary | ICD-10-CM

## 2024-04-02 DIAGNOSIS — R2689 Other abnormalities of gait and mobility: Secondary | ICD-10-CM

## 2024-04-02 DIAGNOSIS — M62838 Other muscle spasm: Secondary | ICD-10-CM

## 2024-04-03 ENCOUNTER — Encounter: Payer: Self-pay | Admitting: Internal Medicine

## 2024-04-03 ENCOUNTER — Ambulatory Visit: Admitting: Internal Medicine

## 2024-04-03 VITALS — BP 126/74 | HR 87 | Temp 98.0°F | Ht 64.0 in | Wt 217.6 lb

## 2024-04-03 DIAGNOSIS — F17200 Nicotine dependence, unspecified, uncomplicated: Secondary | ICD-10-CM | POA: Diagnosis not present

## 2024-04-03 DIAGNOSIS — Z6837 Body mass index (BMI) 37.0-37.9, adult: Secondary | ICD-10-CM

## 2024-04-03 DIAGNOSIS — E6609 Other obesity due to excess calories: Secondary | ICD-10-CM | POA: Diagnosis not present

## 2024-04-03 DIAGNOSIS — L609 Nail disorder, unspecified: Secondary | ICD-10-CM

## 2024-04-03 DIAGNOSIS — J4489 Other specified chronic obstructive pulmonary disease: Secondary | ICD-10-CM

## 2024-04-03 DIAGNOSIS — E559 Vitamin D deficiency, unspecified: Secondary | ICD-10-CM | POA: Diagnosis not present

## 2024-04-03 DIAGNOSIS — I5033 Acute on chronic diastolic (congestive) heart failure: Secondary | ICD-10-CM

## 2024-04-03 DIAGNOSIS — R609 Edema, unspecified: Secondary | ICD-10-CM

## 2024-04-03 DIAGNOSIS — R739 Hyperglycemia, unspecified: Secondary | ICD-10-CM

## 2024-04-03 DIAGNOSIS — E7800 Pure hypercholesterolemia, unspecified: Secondary | ICD-10-CM

## 2024-04-03 DIAGNOSIS — I10 Essential (primary) hypertension: Secondary | ICD-10-CM

## 2024-04-03 DIAGNOSIS — E66812 Obesity, class 2: Secondary | ICD-10-CM

## 2024-04-03 MED ORDER — FUROSEMIDE 20 MG PO TABS: 20.0000 mg | ORAL_TABLET | Freq: Every day | ORAL | 11 refills | Status: DC

## 2024-04-03 MED ORDER — TRAMADOL HCL 50 MG PO TABS: 50.0000 mg | ORAL_TABLET | Freq: Four times a day (QID) | ORAL | 1 refills | Status: DC | PRN

## 2024-04-03 MED ORDER — PHENTERMINE HCL 37.5 MG PO CAPS: 37.5000 mg | ORAL_CAPSULE | ORAL | 1 refills | Status: AC

## 2024-04-03 NOTE — Progress Notes (Signed)
 Patient ID: Frances Nelson, female   DOB: 04/04/1956, 68 y.o.   MRN: [id number]        Chief Complaint: follow up worsening leg swelling, nail disorder, copd asthma, low vit d, smoker, htn, hld, obesity, left shoulder pain chronic, hyperglycemia       HPI:  Frances Nelson is a 68 y.o. female here with c/o persistent even worsening leg swelling despite recent hct 12.5 mg. Stopped the HCT b/c did not seem to be effective Pt denies chest pain, increased sob or doe, wheezing, orthopnea, PND, palpitations, dizziness or syncope.  Pt denies polydipsia, polyuria, or new focal neuro s/s.    Pt denies fever, wt loss, night sweats, loss of appetite, or other constitutional symptoms  Unable to lose significant wt and not able to afford GLP1.  Has worsening toenail issues and needs podiatry  Has chronic left shoulder pain avoiding ortho for now, needs tramadol  refill. Still smoking, unable to quit Wt Readings from Last 3 Encounters:  04/03/24 217 lb 9.6 oz (98.7 kg)  02/21/24 215 lb (97.5 kg)  02/19/24 219 lb (99.3 kg)   BP Readings from Last 3 Encounters:  04/03/24 126/74  02/21/24 133/61  02/19/24 132/82         Past Medical History:  Diagnosis Date   Allergy 01.01.1975   Ambulates with cane    straight cane   Arthritis    Asthma    Bronchitis, obstructive, chronic (HCC)    hx   Cataracts, bilateral    pt has not had surgery to remove as of 02/15/24   Complication of anesthesia    patient states slow to wake up after anesthesia   COPD (chronic obstructive pulmonary disease) (HCC)    Dyspnea    only with exertion   Fibromyalgia    GERD (gastroesophageal reflux disease)    Headache    HLD (hyperlipidemia)    Hypertension    Pneumonia    x several   Past Surgical History:  Procedure Laterality Date   ABDOMINAL HYSTERECTOMY  8.19.1990   APPENDECTOMY  9.1.1980   BICEPT TENODESIS Left 02/21/2024   Procedure: TENODESIS, BICEPS;  Surgeon: Addie Cordella Hamilton, MD;  Location: [facility] OR;   Service: Orthopedics;  Laterality: Left;   CYST EXCISION     upper right side lateral - benign   HERNIA REPAIR     Umbilical x 3   KNEE ARTHROSCOPY W/ MENISCAL REPAIR Left 12/2022   POSTERIOR LUMBAR FUSION 2 WITH HARDWARE REMOVAL Left 02/21/2024   Procedure: ARTHROSCOPY, SHOULDER WITH DEBRIDEMENT;  Surgeon: Addie Cordella Hamilton, MD;  Location: [facility] OR;  Service: Orthopedics;  Laterality: Left;  left shoulder arthroscopy, debridement, biceps tenodesis, mini open rotator cuff tear repair   SHOULDER OPEN ROTATOR CUFF REPAIR Left 02/21/2024   Procedure: REPAIR, ROTATOR CUFF, OPEN;  Surgeon: Addie Cordella Hamilton, MD;  Location: [facility] OR;  Service: Orthopedics;  Laterality: Left;   SPINE SURGERY  05/2021   TONSILLECTOMY     TRANSFORAMINAL LUMBAR INTERBODY FUSION (TLIF) WITH PEDICLE SCREW FIXATION 1 LEVEL N/A 05/18/2022   Procedure: Lumbar Four-Five Open Laminectomy/Transforaminal Lumbar Interbody Fusion/Posterolateral fusion;  Surgeon: Cheryle Debby LABOR, MD;  Location: [facility] OR;  Service: Neurosurgery;  Laterality: N/A;    reports that she has been smoking cigarettes. She started smoking about 50 years ago. She has a 40.7 pack-year smoking history. She has never been exposed to tobacco smoke. She has never used smokeless tobacco. She reports current drug use. Drug: Amphetamines. She reports that she  does not drink alcohol. family history includes Allergies in her son and son; Angina in her father; Arthritis in her mother; Asthma in her son, son, and son; Brain cancer in her brother; COPD in her mother; Cancer in her brother, brother, brother, sister, and sister; Depression in her mother; Drug abuse in her son; Early death in her son; Hearing loss in her father and mother; Heart disease in her father; Kidney disease in her brother and sister; Leukemia in her sister; Parkinson's disease in her father. Allergies  Allergen Reactions   Symbicort  [Budesonide -Formoterol  Fumarate] Other (See Comments)    Patient  reported dizziness, nausea, headaches and sore throat while using Symbicort  160. Reported on 09/03/17   Tramadol  Hives    Patient states can take tramadol  with a coating on the med as of 02/15/24.   Celebrex  [Celecoxib ]     Bleeding.     Chocolate Flavoring Agent (Non-Screening)    Ciprofloxacin Nausea And Vomiting    Headache, shaking.    Egg-Derived Products    Flavoring Agent (Non-Screening)     Unknown   Lisinopril  Cough    Pt off med for a week, some improvement   Lyrica [Pregabalin]     Made me out of it.    Penicillins     Patient preference   Wellbutrin [Bupropion]    Codeine Palpitations   Current Outpatient Medications on File Prior to Visit  Medication Sig Dispense Refill   acetaminophen  (TYLENOL ) 650 MG CR tablet Take 1,300 mg by mouth every 8 (eight) hours.     acidophilus (RISAQUAD) CAPS capsule Take 1 capsule by mouth in the morning.     albuterol  (PROVENTIL ) (2.5 MG/3ML) 0.083% nebulizer solution Take 3 mLs (2.5 mg total) by nebulization every 6 (six) hours as needed for wheezing or shortness of breath. 150 mL 5   albuterol  (VENTOLIN  HFA) 108 (90 Base) MCG/ACT inhaler Inhale 2 puffs into the lungs every 6 (six) hours as needed for wheezing or shortness of breath. 8 g 6   alendronate  (FOSAMAX ) 70 MG tablet Take 1 tablet (70 mg total) by mouth every 7 (seven) days. Take with a full glass of water on an empty stomach. 12 tablet 3   Calcium -Vitamins C & D (CALCIUM /C/D PO) Take 1 tablet by mouth in the morning.     cetirizine (ZYRTEC) 10 MG tablet Take 10 mg by mouth in the morning.     Cholecalciferol (HM VITAMIN D3 PO) Take 5,000 Units by mouth in the morning.     cyclobenzaprine  (FLEXERIL ) 10 MG tablet Take 1 tablet (10 mg total) by mouth every 8 (eight) hours. 30 tablet 0   DULoxetine  (CYMBALTA ) 30 MG capsule TAKE 1 CAPSULE IN THE MORNING AND 2 CAPSULES IN THE EVENING 270 capsule 4   ezetimibe  (ZETIA ) 10 MG tablet Take 1 tablet (10 mg total) by mouth daily. 90 tablet 3    gabapentin  (NEURONTIN ) 100 MG capsule Take 1 capsule (100 mg total) by mouth 3 (three) times daily. (Patient taking differently: Take 100 mg by mouth daily as needed (pain).) 90 capsule 5   lidocaine  (LIDODERM ) 5 % Place 1 patch onto the skin daily. Remove & Discard patch within 12 hours or as directed by MD 30 patch 0   lidocaine  (LMX) 4 % cream Apply 1 Application topically as needed.     losartan  (COZAAR ) 25 MG tablet TAKE 1 TABLET (25 MG TOTAL) BY MOUTH DAILY. 90 tablet 3   MELATONIN PO Take 5 mg by mouth at  bedtime.     meloxicam  (MOBIC ) 15 MG tablet Take 1 tablet (15 mg total) by mouth daily. 30 tablet 0   methocarbamol  (ROBAXIN ) 750 MG tablet Take 1 tablet (750 mg total) by mouth every 8 (eight) hours as needed for muscle spasms. 60 tablet 2   mometasone  (NASONEX ) 50 MCG/ACT nasal spray Place 2 sprays into the nose every evening.     montelukast  (SINGULAIR ) 10 MG tablet TAKE 1 TABLET BY MOUTH EVERYDAY AT BEDTIME 90 tablet 3   Multiple Vitamins-Minerals (MULTIVITAMIN WITH MINERALS) tablet Take 1 tablet by mouth in the morning.     omeprazole  (PRILOSEC) 40 MG capsule TAKE 1 CAPSULE (40 MG TOTAL) BY MOUTH IN THE MORNING AND AT BEDTIME. 180 capsule 3   No current facility-administered medications on file prior to visit.        ROS:  All others reviewed and negative.  Objective        PE:  BP 126/74   Pulse 87   Temp 98 F (36.7 C)   Ht 5' 4 (1.626 m)   Wt 217 lb 9.6 oz (98.7 kg)   SpO2 95%   BMI 37.35 kg/m                 Constitutional: Pt appears in NAD               HENT: Head: NCAT.                Right Ear: External ear normal.                 Left Ear: External ear normal.                Eyes: . Pupils are equal, round, and reactive to light. Conjunctivae and EOM are normal               Nose: without d/c or deformity               Neck: Neck supple. Gross normal ROM               Cardiovascular: Normal rate and regular rhythm.                 Pulmonary/Chest: Effort  normal and breath sounds without rales or wheezing.                Abd:  Soft, NT, ND, + BS, no organomegaly               Neurological: Pt is alert. At baseline orientation, motor grossly intact               Skin: Skin is warm. No rashes, no other new lesions, LE edema - 1-2+ bilateral               Psychiatric: Pt behavior is normal without agitation   Micro: none  Cardiac tracings I have personally interpreted today:  none  Pertinent Radiological findings (summarize): none   Lab Results  Component Value Date   WBC 8.3 02/15/2024   HGB 15.5 (H) 02/15/2024   HCT 47.2 (H) 02/15/2024   PLT 345 02/15/2024   GLUCOSE 90 02/15/2024   CHOL 207 (H) 10/11/2023   TRIG 208.0 (H) 10/11/2023   HDL 50.00 10/11/2023   LDLDIRECT 143.0 10/05/2022   LDLCALC 115 (H) 10/11/2023   ALT 33 10/11/2023   AST 28 10/11/2023   NA 139 02/15/2024   K 4.8 02/15/2024   CL  101 02/15/2024   CREATININE 0.71 02/15/2024   BUN 13 02/15/2024   CO2 30 02/15/2024   TSH 1.78 10/11/2023   HGBA1C 6.1 10/11/2023   MICROALBUR 1.4 10/11/2023   Assessment/Plan:  Frances Nelson is a 68 y.o. White or Caucasian [1] female with  has a past medical history of Allergy (01.01.1975), Ambulates with cane, Arthritis, Asthma, Bronchitis, obstructive, chronic (HCC), Cataracts, bilateral, Complication of anesthesia, COPD (chronic obstructive pulmonary disease) (HCC), Dyspnea, Fibromyalgia, GERD (gastroesophageal reflux disease), Headache, HLD (hyperlipidemia), Hypertension, and Pneumonia.  COPD with asthma (HCC) Stable, cont inhaler prn  Vitamin D  deficiency Last vitamin D  Lab Results  Component Value Date   VD25OH 44.88 10/11/2023   Stable, cont oral replacement   Smoker Pt counsled to quit, pt not ready  Hypertension BP Readings from Last 3 Encounters:  04/03/24 126/74  02/21/24 133/61  02/19/24 132/82   Stable, pt to continue medical treatment losartan  25 mg qd   Hyperglycemia Lab Results  Component Value  Date   HGBA1C 6.1 10/11/2023   Stable, pt to continue current medical treatment  - diet, wt control   HLD (hyperlipidemia) Lab Results  Component Value Date   LDLCALC 115 (H) 10/11/2023   Uncontrolled, , pt to continue zetia  10 mg every day, decliens statin or repatha  for now, for lower chol diet   Edema With mild worsening, for lasix  20 mg every day, for Echocardiogram  Nail disorder Worsening, Also for podiatry referral  Followup: Return in about 4 weeks (around 05/01/2024).  Lynwood Rush, MD 04/05/2024 8:02 PM [facility], [facility]

## 2024-04-03 NOTE — Patient Instructions (Addendum)
 Ok to stop the HCT fluid pill as you have done  Please take all new medication as prescribed - the Lasix  20 mg per day  Please continue all other medications as before, and refills have been done if requested- tramadol   Please have the pharmacy call with any other refills you may need.  Please keep your appointments with your specialists as you may have planned  You will be contacted regarding the referral for: Echocardiogram, and Podiatry  We can hold on lab testing today  Please make an Appointment to return in 3- 4 weeks, or sooner if needed

## 2024-04-04 ENCOUNTER — Encounter: Payer: Self-pay | Admitting: Physical Therapy

## 2024-04-04 ENCOUNTER — Ambulatory Visit (INDEPENDENT_AMBULATORY_CARE_PROVIDER_SITE_OTHER): Admitting: Physical Therapy

## 2024-04-04 DIAGNOSIS — M25512 Pain in left shoulder: Secondary | ICD-10-CM | POA: Diagnosis not present

## 2024-04-04 DIAGNOSIS — M25612 Stiffness of left shoulder, not elsewhere classified: Secondary | ICD-10-CM

## 2024-04-04 DIAGNOSIS — M6281 Muscle weakness (generalized): Secondary | ICD-10-CM

## 2024-04-04 DIAGNOSIS — G8929 Other chronic pain: Secondary | ICD-10-CM

## 2024-04-04 DIAGNOSIS — R600 Localized edema: Secondary | ICD-10-CM | POA: Diagnosis not present

## 2024-04-04 NOTE — Therapy (Signed)
 OUTPATIENT PHYSICAL THERAPY TREATMENT   Patient Name: Frances Nelson MRN: [id number] DOB:1955-11-26, 68 y.o., female Today's Date: 04/04/2024  END OF SESSION:  PT End of Session - 04/04/24 1009     Visit Number 5    Number of Visits 30    Date for Recertification  06/12/24    Authorization Type [employer] AND [employer] [place]    Progress Note Due on Visit 10    PT Start Time 1010    PT Stop Time 1058    PT Time Calculation (min) 48 min    Activity Tolerance Patient limited by pain    Behavior During Therapy [facility] for tasks assessed/performed             Past Medical History:  Diagnosis Date   Allergy 01.01.1975   Ambulates with cane    straight cane   Arthritis    Asthma    Bronchitis, obstructive, chronic (HCC)    hx   Cataracts, bilateral    pt has not had surgery to remove as of 02/15/24   Complication of anesthesia    patient states slow to wake up after anesthesia   COPD (chronic obstructive pulmonary disease) (HCC)    Dyspnea    only with exertion   Fibromyalgia    GERD (gastroesophageal reflux disease)    Headache    HLD (hyperlipidemia)    Hypertension    Pneumonia    x several   Past Surgical History:  Procedure Laterality Date   ABDOMINAL HYSTERECTOMY  8.19.1990   APPENDECTOMY  9.1.1980   BICEPT TENODESIS Left 02/21/2024   Procedure: TENODESIS, BICEPS;  Surgeon: Addie Cordella Hamilton, MD;  Location: [facility] OR;  Service: Orthopedics;  Laterality: Left;   CYST EXCISION     upper right side lateral - benign   HERNIA REPAIR     Umbilical x 3   KNEE ARTHROSCOPY W/ MENISCAL REPAIR Left 12/2022   POSTERIOR LUMBAR FUSION 2 WITH HARDWARE REMOVAL Left 02/21/2024   Procedure: ARTHROSCOPY, SHOULDER WITH DEBRIDEMENT;  Surgeon: Addie Cordella Hamilton, MD;  Location: [facility] OR;  Service: Orthopedics;  Laterality: Left;  left shoulder arthroscopy, debridement, biceps tenodesis, mini open rotator cuff tear repair   SHOULDER OPEN ROTATOR CUFF REPAIR Left 02/21/2024    Procedure: REPAIR, ROTATOR CUFF, OPEN;  Surgeon: Addie Cordella Hamilton, MD;  Location: [facility] OR;  Service: Orthopedics;  Laterality: Left;   SPINE SURGERY  05/2021   TONSILLECTOMY     TRANSFORAMINAL LUMBAR INTERBODY FUSION (TLIF) WITH PEDICLE SCREW FIXATION 1 LEVEL N/A 05/18/2022   Procedure: Lumbar Four-Five Open Laminectomy/Transforaminal Lumbar Interbody Fusion/Posterolateral fusion;  Surgeon: Cheryle Debby LABOR, MD;  Location: [facility] OR;  Service: Neurosurgery;  Laterality: N/A;   Patient Active Problem List   Diagnosis Date Noted   Synovitis of left shoulder 03/09/2024   Biceps tendonitis on left 03/09/2024   Degenerative superior labral anterior-to-posterior (SLAP) tear of left shoulder 03/09/2024   Complete tear of left rotator cuff 03/09/2024   Obesity 10/13/2023   Shingles outbreak 05/29/2023   Microhematuria 04/09/2023   Bilateral shoulder pain 04/09/2023   Urethritis 10/05/2022   External otitis of left ear 10/05/2022   Spondylolisthesis of lumbar region 05/18/2022   Smoker 04/08/2022   Deviated nasal septum 04/08/2022   Bilateral pain of leg and foot 04/08/2022   Follicular acne 04/06/2022   Baker cyst, left 04/06/2022   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   Depression 10/04/2021   Left knee pain 10/04/2021   Chronic  pain 04/09/2021   Pelvic pain 04/09/2021   HLD (hyperlipidemia) 04/09/2021   Vitamin D  deficiency 04/09/2021   Estrogen deficiency 04/09/2021   Hyperglycemia 04/01/2021   Chronic cough 12/30/2020   GERD (gastroesophageal reflux disease) 11/06/2019   Bilateral leg edema 10/16/2019   Pleuritic chest pain 10/16/2019   Hypertension 10/02/2019   Arthralgia 06/28/2018   Sinusitis 06/28/2018   Renal cyst 05/03/2018   Syncope 05/03/2018   Stress and adjustment reaction 05/03/2018   History of tobacco use 05/02/2018   Primary osteoarthritis of both hands 04/10/2018   Primary osteoarthritis of both knees 04/10/2018   Primary  osteoarthritis of both feet 04/10/2018   Achilles tendinitis 04/02/2018   Screening for breast cancer 03/15/2018   Positive ANA (antinuclear antibody) 03/15/2018   Screening for colon cancer 03/15/2018   Rash 03/15/2018   Achilles tendon pain 11/13/2017   Cervical myelopathy with cervical radiculopathy (HCC) 11/13/2017   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   Allergic rhinitis 06/19/2017   Edema 12/24/2015   Carpal tunnel syndrome, right 12/16/2015   Hypercalcemia 11/11/2015   Pain in both upper extremities 11/11/2015   Drug reaction 10/28/2015   Muscle spasms of both lower extremities 10/28/2015   Nerve pain 10/28/2015   Chronic asthmatic bronchitis with acute exacerbation (HCC) 09/06/2015   Environmental and seasonal allergies 07/17/2014    PCP: Lynwood LELON Rush, MD   REFERRING PROVIDER: Cordella Glendia Hutchinson, MD   REFERRING DIAG: [phone] (ICD-10-CM) - S/P rotator cuff repair  THERAPY DIAG:  Chronic left shoulder pain  Stiffness of left shoulder, not elsewhere classified  Muscle weakness (generalized)  Localized edema  Rationale for Evaluation and Treatment: Rehabilitation  ONSET DATE: 02/21/2024 left shoulder arthroscopy with debridement biceps tenodesis rotator cuff tear repair with a 2 x 3 construct  SUBJECTIVE:                                                                                                                                                                                      SUBJECTIVE STATEMENT: Pain is better than the other day, still having pain though    Hand dominance: Right  PERTINENT HISTORY: Patient endorsed having gone through PT and multiple shoulder injection prior to appointment with Dr. Hutchinson, who scheduled and performed her surgery. Patient notes that her original pain started in Feb 2025, though did have a fall down stairs Feb 2024 and is not sure if that is when she injured shoulder as she did not have pain at that time. Patient used CPM daily  and wears sling only in public at this time, but is working on weaning from both. Patient endorses difficulty with sleeping and can average  5 hours per night when not in so much pain. Patient has undergone knee surgery (2023), back surgery (2024), and also currently has bilat carpal tunnel with n/t in fingers and hands.  PAIN:   NPRS scale: 6/10 Pain location: Lt shoulder/arm Pain description: all pain descriptors, but most of the time achey Aggravating factors: moving a certain way, reaching, trying to do hair, picking something up   Relieving factors: tylenol  arthritis twice a day, tramadol  as needed (mid morning, before bed), muscle relaxer, arnica herb (gel for bruising and pain), lidocaine  patches, ice    PRECAUTIONS: Shoulder  RED FLAGS: None   WEIGHT BEARING RESTRICTIONS: Yes Lt shoulder   FALLS:  Has patient fallen in last 6 months? Yes. Number of falls 1, slipped in the bath tub   LIVING ENVIRONMENT: Lives with: lives with their family and lives with their son Lives in: House/apartment Stairs: Yes: Internal: 15 steps; on left going up and External: 3 steps; none Has following equipment at home: Single point cane, Quad cane small base, Walker - 2 wheeled, shower chair, Shower bench, and Grab bars  OCCUPATION: Retired; Print production planner   PLOF: Independent and Independent with community mobility with device  PATIENT GOALS: to be able to use my arm again   NEXT MD VISIT: April 16, 2024 with Dr. Addie   OBJECTIVE:  Note: Objective measures were completed at Evaluation unless otherwise noted.  DIAGNOSTIC FINDINGS:    PATIENT SURVEYS :  PSFS: THE PATIENT SPECIFIC FUNCTIONAL SCALE  Place score of 0-10 (0 = unable to perform activity and 10 = able to perform activity at the same level as before injury or problem)  Activity Date: 03/20/2024    Get dressed and putting shoes on  4    2. Brush and style your hair / showering  1    3. Cook  2    4. Washing dishes  1     5.  Clean, sweep, mop  1    Total Score 1.8      Total Score = Sum of activity scores/number of activities  Minimally Detectable Change: 3 points (for single activity); 2 points (for average score)  [facility] (nd). The Patient Specific Functional Scale . Retrieved from [email or web address]   COGNITION: Overall cognitive status: Within functional limits for tasks assessed     SENSATION: Light touch: diminished in fingers secondary to carpal tunnel Carpal tunnel leading to n/t in fingers;   POSTURE: Increased thoracic kyphosis, forward head, and forward rounded shoulders   UPPER EXTREMITY ROM:    ROM Right Eval 03/20/2024 Left Eval 03/20/2024 Left 04/04/24  Shoulder flexion WFL 85 AAROM, highly painful A: 15  (supine)  Shoulder extension     Shoulder abduction WFL 60 AAROM, highly painful   Shoulder adduction     Shoulder internal rotation T8 75 AAROM at 45deg abd in supine   Shoulder external rotation T1 23 AAROM at 45deg abd in supine   (Blank rows = not tested)  UPPER EXTREMITY MMT: unable to perform on eval due to protocol restrictions  MMT Right Eval 03/20/2024 Left Eval 03/20/2024  Shoulder flexion    Shoulder extension    Shoulder abduction    Shoulder adduction    Shoulder internal rotation    Shoulder external rotation    Middle trapezius    Lower trapezius    Elbow flexion    Elbow extension    Wrist flexion    Wrist extension    Wrist ulnar  deviation    Wrist radial deviation    Wrist pronation    Wrist supination    Grip strength (lbs)    (Blank rows = not tested)  SHOULDER SPECIAL TESTS: Did not perform on eval   JOINT MOBILITY TESTING:  Not assessed on eval   PALPATION:  Not assessed one val     TREATMENT DATE:  04/04/24 ROM Measurements - see above for details Supine AA chest press 2# bar 2x10 Supine AA ER 2# bar; Lt 2x10 Supine shoulder circles AA to A flexion with elbow  flexed x 5 reps - pain with eccentric return Sidelying ER 2x10 on Lt active Seated scapular retraction x10; 5 sec hold  Supine PROM with gentle overpressure all directions Lt shoulder  Vaso x 10 min low pressure; Lt shoulder sitting    04/02/2024 Manual:  PROM with distraction for shoulder flexion and abduction   TherEx: Supine AAROM chest press with 1# bar 2x6  Seated AAROM ER with 1# bar 1x10 with 3s hold  Isometrics in doorway to begin shoulder strengthening  1x12 with 2-3s hold in flexion, abduction, extension, IR, ER  Intermittent verbal cues for appropriate form   Vaso:  Medium compression to Lt shoulder for 10 minutes at 34deg   03/31/2024 Manual:  PROM with distraction for shoulder flexion, abduction, and scaption   TherEx: Supine AAROM with PVC for shoulder flexion 2x8 Seated scap squeezes 1x10  Seated UT stretch 2x30s each side  Shoulder circles 1x10 fwd, 1x10 back   Vaso:  Medium compression to Lt shoulder for 10 minutes at 34deg   03/21/2024 Manual PROM  with distraction of Lt shoulder flexion and abduction ranges  TherEx:  Supine scapular squeezes, 1x10 Supine AAROM shoulder depression/elevation, 1x10 Bicep curls, 1x10 Pendulums: lateral, fwd/bwd, CW, CCW  Vaso:  Low compression to Lt shoulder for 10 minutes at 34deg                                                                                                                           03/20/2024 TherEx:  HEP handout provided with patient not performing activities due to high pain levels. Demonstration and explanation provided.  Vaso:  Medium compression to Lt shoulder for 10 minutes at 34deg; lowered to low compression after 3 minutes  Pillow placed under Lt elbow to support shoulder    PATIENT EDUCATION: Education details: HEP, POC, typical recovery from rotator cuff repair Person educated: Patient and Child(ren) Education method: Explanation, Demonstration, Verbal cues, and Handouts Education  comprehension: verbalized understanding  HOME EXERCISE PROGRAM: Access Code: [email or web address] URL: [email or web address] Date: 03/20/2024 Prepared by: Susannah Daring  Exercises - Circular Shoulder Pendulum with Table Support  - 1 x daily - 7 x weekly - 3 sets - 10 reps - Flexion-Extension Shoulder Pendulum with Table Support  - 1 x daily - 7 x weekly - 3 sets - 10 reps - Horizontal Shoulder Pendulum with Table Support  - 1 x daily - 7 x  weekly - 3 sets - 10 reps - Seated Scapular Retraction  - 1 x daily - 7 x weekly - 3 sets - 10 reps - Seated AAROM Shoulder Elevation/Depression  - 1 x daily - 7 x weekly - 3 sets - 10 reps  ASSESSMENT:  CLINICAL IMPRESSION:  Able to lightly progress AROM today with expected pain and fatigue.  Will continue to benefit from PT to maximize function.    OBJECTIVE IMPAIRMENTS: decreased activity tolerance, decreased coordination, decreased endurance, decreased mobility, decreased ROM, decreased strength, decreased safety awareness, hypomobility, increased edema, impaired flexibility, impaired sensation, impaired UE functional use, improper body mechanics, postural dysfunction, obesity, and pain.   ACTIVITY LIMITATIONS: carrying, lifting, sleeping, bed mobility, bathing, dressing, reach over head, and hygiene/grooming  PARTICIPATION LIMITATIONS: meal prep, cleaning, driving, community activity, and yard work  PERSONAL FACTORS: Past/current experiences, Time since onset of injury/illness/exacerbation, and 3+ comorbidities: HTN, hyperlipidemia, GERD, fibromyalgia, COPD, dyspnea, cataracts, chronic obstructive bronchitis, asthma, arthritis, history of sleep apnea are also affecting patient's functional outcome.   REHAB POTENTIAL: Good  CLINICAL DECISION MAKING: Evolving/moderate complexity  EVALUATION COMPLEXITY: Moderate  GOALS: Goals reviewed with patient? Yes  SHORT TERM GOALS: Target date: 04/24/2024  Patient will show compliance with initial  HEP. Baseline: Goal status: ongoing, 03/21/2024  2.  Patient will report overall pain levels no greater than 5/10. Baseline:  Goal status: ongoing, 03/21/2024  3.  Patient will gain full PROM and AAROM of Lt shoulder to improve functional mobility. Baseline:  Goal status: ongoing, 03/21/2024  4.  Patient will increase PSFS to at least 3.8 to show a significant improvement to subjective disability rating.  Baseline:  Goal status: ongoing, 03/21/2024   LONG TERM GOALS: Target date: 06/12/2024  Patient will be independent with final HEP in order to maintain and progress upon functional gains made within PT. Baseline:  Goal status: ongoing, 03/21/2024  2.  Patient will report overall pain levels no greater than 2/10. Baseline:  Goal status: ongoing, 03/21/2024  3.   Patient will increase PSFS to at least 5.8 to show a significant improvement to subjective disability rating.  Baseline:  Goal status: ongoing, 03/21/2024  4.  Patient will return to full AROM of Lt shoulder in order to improve overall functional mobility.  Baseline:  Goal status: ongoing, 03/21/2024  5.  Patient will show strength in all motions in Lt shoulder to at least 4-/5 in order to show improved biomechanics with functional mobility.  Baseline:  Goal status: ongoing, 03/21/2024  6.  Patient will report improvement in overall sleep through the night in order to show improved overall quality of life. Baseline:  Goal status: ongoing, 03/21/2024  PLAN: PT FREQUENCY: 2-3x/wk for 6 weeks, 1-2x/wk for 6 weeks   PT DURATION: 12 weeks  PLANNED INTERVENTIONS: 97164- PT Re-evaluation, 97750- Physical Performance Testing, 97110-Therapeutic exercises, 97530- Therapeutic activity, [id number]- Neuromuscular re-education, 97535- Self Care, 02859- Manual therapy, [id number]- Gait training, [phone]- Canalith repositioning, H9716- Electrical stimulation (unattended), [phone]- Electrical stimulation (manual), [id number]- Vasopneumatic device, [id number]- Ultrasound,  [id number]- Traction (mechanical), [id number]- Ionotophoresis 4mg /ml Dexamethasone , 79439 (1-2 muscles), 20561 (3+ muscles)- Dry Needling, Patient/Family education, Balance training, Stair training, Taping, Joint mobilization, Joint manipulation, Spinal manipulation, Spinal mobilization, Scar mobilization, Vestibular training, DME instructions, Cryotherapy, and Moist heat  PLAN FOR NEXT SESSION:  ROM and strength progressions as able   Left shoulder AAROM and PROM for 3 week then ok for strengthening (starting 04/02/24)  Corean JULIANNA Ku, PT, DPT 04/04/24 11:10 AM

## 2024-04-05 ENCOUNTER — Encounter: Payer: Self-pay | Admitting: Internal Medicine

## 2024-04-05 DIAGNOSIS — L609 Nail disorder, unspecified: Secondary | ICD-10-CM | POA: Insufficient documentation

## 2024-04-05 NOTE — Assessment & Plan Note (Signed)
 Lab Results  Component Value Date   LDLCALC 115 (H) 10/11/2023   Uncontrolled, , pt to continue zetia  10 mg every day, decliens statin or repatha  for now, for lower chol diet

## 2024-04-05 NOTE — Assessment & Plan Note (Signed)
 Pt counsled to quit, pt not ready

## 2024-04-05 NOTE — Assessment & Plan Note (Signed)
 With mild worsening, for lasix  20 mg every day, for Echocardiogram

## 2024-04-05 NOTE — Assessment & Plan Note (Signed)
 Lab Results  Component Value Date   HGBA1C 6.1 10/11/2023   Stable, pt to continue current medical treatment  - diet, wt control

## 2024-04-05 NOTE — Assessment & Plan Note (Signed)
 BP Readings from Last 3 Encounters:  04/03/24 126/74  02/21/24 133/61  02/19/24 132/82   Stable, pt to continue medical treatment losartan  25 mg qd

## 2024-04-05 NOTE — Assessment & Plan Note (Signed)
 Last vitamin D Lab Results  Component Value Date   VD25OH 44.88 10/11/2023   Stable, cont oral replacement

## 2024-04-05 NOTE — Assessment & Plan Note (Signed)
 Stable , cont inhaler prn

## 2024-04-05 NOTE — Assessment & Plan Note (Signed)
 Ok for phentermine  37 mg every day trial

## 2024-04-05 NOTE — Assessment & Plan Note (Addendum)
 Worsening, Also for podiatry referral

## 2024-04-07 ENCOUNTER — Encounter: Payer: Self-pay | Admitting: Physical Therapy

## 2024-04-07 ENCOUNTER — Ambulatory Visit (INDEPENDENT_AMBULATORY_CARE_PROVIDER_SITE_OTHER)

## 2024-04-07 DIAGNOSIS — R2689 Other abnormalities of gait and mobility: Secondary | ICD-10-CM

## 2024-04-07 DIAGNOSIS — M62838 Other muscle spasm: Secondary | ICD-10-CM

## 2024-04-07 DIAGNOSIS — M25612 Stiffness of left shoulder, not elsewhere classified: Secondary | ICD-10-CM | POA: Diagnosis not present

## 2024-04-07 DIAGNOSIS — M6281 Muscle weakness (generalized): Secondary | ICD-10-CM | POA: Diagnosis not present

## 2024-04-07 DIAGNOSIS — M25512 Pain in left shoulder: Secondary | ICD-10-CM | POA: Diagnosis not present

## 2024-04-07 DIAGNOSIS — R600 Localized edema: Secondary | ICD-10-CM | POA: Diagnosis not present

## 2024-04-07 DIAGNOSIS — G8929 Other chronic pain: Secondary | ICD-10-CM

## 2024-04-07 NOTE — Therapy (Signed)
 OUTPATIENT PHYSICAL THERAPY TREATMENT   Patient Name: Frances Nelson MRN: [id number] DOB:[birth year], 68 y.o., female Today's Date: 04/07/2024  END OF SESSION:  PT End of Session - 04/07/24 1308     Visit Number 6    Number of Visits 30    Date for Recertification  06/12/24    Authorization Type [employer] AND [employer] [place]    Progress Note Due on Visit 10    PT Start Time 1303    PT Stop Time 1353    PT Time Calculation (min) 50 min    Activity Tolerance Patient limited by pain    Behavior During Therapy [facility] for tasks assessed/performed            Past Medical History:  Diagnosis Date   Allergy 01.01.1975   Ambulates with cane    straight cane   Arthritis    Asthma    Bronchitis, obstructive, chronic (HCC)    hx   Cataracts, bilateral    pt has not had surgery to remove as of 02/15/24   Complication of anesthesia    patient states slow to wake up after anesthesia   COPD (chronic obstructive pulmonary disease) (HCC)    Dyspnea    only with exertion   Fibromyalgia    GERD (gastroesophageal reflux disease)    Headache    HLD (hyperlipidemia)    Hypertension    Pneumonia    x several   Past Surgical History:  Procedure Laterality Date   ABDOMINAL HYSTERECTOMY  8.19.1990   APPENDECTOMY  9.1.1980   BICEPT TENODESIS Left 02/21/2024   Procedure: TENODESIS, BICEPS;  Surgeon: Addie Cordella Hamilton, MD;  Location: [facility] OR;  Service: Orthopedics;  Laterality: Left;   CYST EXCISION     upper right side lateral - benign   HERNIA REPAIR     Umbilical x 3   KNEE ARTHROSCOPY W/ MENISCAL REPAIR Left 12/2022   POSTERIOR LUMBAR FUSION 2 WITH HARDWARE REMOVAL Left 02/21/2024   Procedure: ARTHROSCOPY, SHOULDER WITH DEBRIDEMENT;  Surgeon: Addie Cordella Hamilton, MD;  Location: [facility] OR;  Service: Orthopedics;  Laterality: Left;  left shoulder arthroscopy, debridement, biceps tenodesis, mini open rotator cuff tear repair   SHOULDER OPEN ROTATOR CUFF REPAIR Left 02/21/2024   Procedure:  REPAIR, ROTATOR CUFF, OPEN;  Surgeon: Addie Cordella Hamilton, MD;  Location: [facility] OR;  Service: Orthopedics;  Laterality: Left;   SPINE SURGERY  05/2021   TONSILLECTOMY     TRANSFORAMINAL LUMBAR INTERBODY FUSION (TLIF) WITH PEDICLE SCREW FIXATION 1 LEVEL N/A 05/18/2022   Procedure: Lumbar Four-Five Open Laminectomy/Transforaminal Lumbar Interbody Fusion/Posterolateral fusion;  Surgeon: Cheryle Debby LABOR, MD;  Location: [facility] OR;  Service: Neurosurgery;  Laterality: N/A;   Patient Active Problem List   Diagnosis Date Noted   Nail disorder 04/05/2024   Synovitis of left shoulder 03/09/2024   Biceps tendonitis on left 03/09/2024   Degenerative superior labral anterior-to-posterior (SLAP) tear of left shoulder 03/09/2024   Complete tear of left rotator cuff 03/09/2024   Obesity 10/13/2023   Shingles outbreak 05/29/2023   Microhematuria 04/09/2023   Bilateral shoulder pain 04/09/2023   Urethritis 10/05/2022   External otitis of left ear 10/05/2022   Spondylolisthesis of lumbar region 05/18/2022   Smoker 04/08/2022   Deviated nasal septum 04/08/2022   Bilateral pain of leg and foot 04/08/2022   Follicular acne 04/06/2022   Baker cyst, left 04/06/2022   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   Depression 10/04/2021   Left knee pain  10/04/2021   Chronic pain 04/09/2021   Pelvic pain 04/09/2021   HLD (hyperlipidemia) 04/09/2021   Vitamin D  deficiency 04/09/2021   Estrogen deficiency 04/09/2021   Hyperglycemia 04/01/2021   Chronic cough 12/30/2020   GERD (gastroesophageal reflux disease) 11/06/2019   Bilateral leg edema 10/16/2019   Pleuritic chest pain 10/16/2019   Hypertension 10/02/2019   Arthralgia 06/28/2018   Sinusitis 06/28/2018   Renal cyst 05/03/2018   Syncope 05/03/2018   Stress and adjustment reaction 05/03/2018   History of tobacco use 05/02/2018   Primary osteoarthritis of both hands 04/10/2018   Primary osteoarthritis of both knees  04/10/2018   Primary osteoarthritis of both feet 04/10/2018   Achilles tendinitis 04/02/2018   Screening for breast cancer 03/15/2018   Positive ANA (antinuclear antibody) 03/15/2018   Screening for colon cancer 03/15/2018   Rash 03/15/2018   Achilles tendon pain 11/13/2017   Cervical myelopathy with cervical radiculopathy (HCC) 11/13/2017   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   Allergic rhinitis 06/19/2017   Edema 12/24/2015   Carpal tunnel syndrome, right 12/16/2015   Hypercalcemia 11/11/2015   Pain in both upper extremities 11/11/2015   Drug reaction 10/28/2015   Muscle spasms of both lower extremities 10/28/2015   Nerve pain 10/28/2015   Chronic asthmatic bronchitis with acute exacerbation (HCC) 09/06/2015   Environmental and seasonal allergies 07/17/2014    PCP: Lynwood LELON Rush, MD   REFERRING PROVIDER: Cordella Glendia Hutchinson, MD   REFERRING DIAG: [phone] (ICD-10-CM) - S/P rotator cuff repair  THERAPY DIAG:  Chronic left shoulder pain  Stiffness of left shoulder, not elsewhere classified  Muscle weakness (generalized)  Localized edema  Other abnormalities of gait and mobility  Other muscle spasm  Rationale for Evaluation and Treatment: Rehabilitation  ONSET DATE: 02/21/2024 left shoulder arthroscopy with debridement biceps tenodesis rotator cuff tear repair with a 2 x 3 construct  SUBJECTIVE:                                                                                                                                                                                      SUBJECTIVE STATEMENT: Patient arrived to session noting pain around 5.5/10.     Hand dominance: Right  PERTINENT HISTORY: Patient endorsed having gone through PT and multiple shoulder injection prior to appointment with Dr. Hutchinson, who scheduled and performed her surgery. Patient notes that her original pain started in Feb 2025, though did have a fall down stairs Feb 2024 and is not sure if that is when  she injured shoulder as she did not have pain at that time. Patient used CPM daily and wears sling only in public at this time, but is  working on weaning from both. Patient endorses difficulty with sleeping and can average 5 hours per night when not in so much pain. Patient has undergone knee surgery (2023), back surgery (2024), and also currently has bilat carpal tunnel with n/t in fingers and hands.  PAIN:   NPRS scale: 6/10 Pain location: Lt shoulder/arm Pain description: all pain descriptors, but most of the time achey Aggravating factors: moving a certain way, reaching, trying to do hair, picking something up   Relieving factors: tylenol  arthritis twice a day, tramadol  as needed (mid morning, before bed), muscle relaxer, arnica herb (gel for bruising and pain), lidocaine  patches, ice    PRECAUTIONS: Shoulder  RED FLAGS: None   WEIGHT BEARING RESTRICTIONS: Yes Lt shoulder   FALLS:  Has patient fallen in last 6 months? Yes. Number of falls 1, slipped in the bath tub   LIVING ENVIRONMENT: Lives with: lives with their family and lives with their son Lives in: House/apartment Stairs: Yes: Internal: 15 steps; on left going up and External: 3 steps; none Has following equipment at home: Single point cane, Quad cane small base, Walker - 2 wheeled, shower chair, Shower bench, and Grab bars  OCCUPATION: Retired; Print production planner   PLOF: Independent and Independent with community mobility with device  PATIENT GOALS: to be able to use my arm again   NEXT MD VISIT: April 16, 2024 with Dr. Addie   OBJECTIVE:  Note: Objective measures were completed at Evaluation unless otherwise noted.  DIAGNOSTIC FINDINGS:    PATIENT SURVEYS :  PSFS: THE PATIENT SPECIFIC FUNCTIONAL SCALE  Place score of 0-10 (0 = unable to perform activity and 10 = able to perform activity at the same level as before injury or problem)  Activity Date: 03/20/2024    Get dressed and putting shoes on  4    2. Brush  and style your hair / showering  1    3. Cook  2    4. Washing dishes  1     5. Clean, sweep, mop  1    Total Score 1.8      Total Score = Sum of activity scores/number of activities  Minimally Detectable Change: 3 points (for single activity); 2 points (for average score)  [facility] (nd). The Patient Specific Functional Scale . Retrieved from [email or web address]   COGNITION: Overall cognitive status: Within functional limits for tasks assessed     SENSATION: Light touch: diminished in fingers secondary to carpal tunnel Carpal tunnel leading to n/t in fingers;   POSTURE: Increased thoracic kyphosis, forward head, and forward rounded shoulders   UPPER EXTREMITY ROM:    ROM Right Eval 03/20/2024 Left Eval 03/20/2024 Left 04/04/24  Shoulder flexion WFL 85 AAROM, highly painful A: 15  (supine)  Shoulder extension     Shoulder abduction WFL 60 AAROM, highly painful   Shoulder adduction     Shoulder internal rotation T8 75 AAROM at 45deg abd in supine   Shoulder external rotation T1 23 AAROM at 45deg abd in supine   (Blank rows = not tested)  UPPER EXTREMITY MMT: unable to perform on eval due to protocol restrictions  MMT Right Eval 03/20/2024 Left Eval 03/20/2024  Shoulder flexion    Shoulder extension    Shoulder abduction    Shoulder adduction    Shoulder internal rotation    Shoulder external rotation    Middle trapezius    Lower trapezius    Elbow flexion    Elbow extension  Wrist flexion    Wrist extension    Wrist ulnar deviation    Wrist radial deviation    Wrist pronation    Wrist supination    Grip strength (lbs)    (Blank rows = not tested)  SHOULDER SPECIAL TESTS: Did not perform on eval   JOINT MOBILITY TESTING:  Not assessed on eval   PALPATION:  Not assessed one val     TREATMENT DATE:  04/07/2024 TherEx:  Pulleys for ROM: shoulder flexion for 3 minutes, shoulder scaption  for 3 minutes  Supine chest press with 2# bar 1x8  Supine AAROM shoulder flexion with 2# bar 1x5 PT discussed HEP with patient   Manual:  PROM abduction, attempted inferior glide but patient notes discomfort so discontinued glide, then ER/IR with posterior glide   Vaso:  Medium compression to Lt shoulder for 10 minutes at 34deg   04/04/24 ROM Measurements - see above for details Supine AA chest press 2# bar 2x10 Supine AA ER 2# bar; Lt 2x10 Supine shoulder circles AA to A flexion with elbow flexed x 5 reps - pain with eccentric return Sidelying ER 2x10 on Lt active Seated scapular retraction x10; 5 sec hold  Supine PROM with gentle overpressure all directions Lt shoulder  Vaso x 10 min low pressure; Lt shoulder sitting    04/02/2024 Manual:  PROM with distraction for shoulder flexion and abduction   TherEx: Supine AAROM chest press with 1# bar 2x6  Seated AAROM ER with 1# bar 1x10 with 3s hold  Isometrics in doorway to begin shoulder strengthening  1x12 with 2-3s hold in flexion, abduction, extension, IR, ER  Intermittent verbal cues for appropriate form   Vaso:  Medium compression to Lt shoulder for 10 minutes at 34deg   03/31/2024 Manual:  PROM with distraction for shoulder flexion, abduction, and scaption   TherEx: Supine AAROM with PVC for shoulder flexion 2x8 Seated scap squeezes 1x10  Seated UT stretch 2x30s each side  Shoulder circles 1x10 fwd, 1x10 back   Vaso:  Medium compression to Lt shoulder for 10 minutes at 34deg                                                                                                                   PATIENT EDUCATION: Education details: HEP, POC, typical recovery from rotator cuff repair Person educated: Patient and Child(ren) Education method: Explanation, Demonstration, Verbal cues, and Handouts Education comprehension: verbalized understanding  HOME EXERCISE PROGRAM: Access Code: [email or web address] URL:  [email or web address] Date: 03/20/2024 Prepared by: Susannah Daring  Exercises - Circular Shoulder Pendulum with Table Support  - 1 x daily - 7 x weekly - 3 sets - 10 reps - Flexion-Extension Shoulder Pendulum with Table Support  - 1 x daily - 7 x weekly - 3 sets - 10 reps - Horizontal Shoulder Pendulum with Table Support  - 1 x daily - 7 x weekly - 3 sets - 10 reps - Seated Scapular Retraction  - 1 x daily - 7 x weekly -  3 sets - 10 reps - Seated AAROM Shoulder Elevation/Depression  - 1 x daily - 7 x weekly - 3 sets - 10 reps  ASSESSMENT:  CLINICAL IMPRESSION:  Patient arrived to session noting a rough start to the day, but having improved pain compared to previous session. Patient tolerating all activities this date, though continues to have difficulty/pain with eccentric motions. Patient will continue to benefit from skilled PT.     OBJECTIVE IMPAIRMENTS: decreased activity tolerance, decreased coordination, decreased endurance, decreased mobility, decreased ROM, decreased strength, decreased safety awareness, hypomobility, increased edema, impaired flexibility, impaired sensation, impaired UE functional use, improper body mechanics, postural dysfunction, obesity, and pain.   ACTIVITY LIMITATIONS: carrying, lifting, sleeping, bed mobility, bathing, dressing, reach over head, and hygiene/grooming  PARTICIPATION LIMITATIONS: meal prep, cleaning, driving, community activity, and yard work  PERSONAL FACTORS: Past/current experiences, Time since onset of injury/illness/exacerbation, and 3+ comorbidities: HTN, hyperlipidemia, GERD, fibromyalgia, COPD, dyspnea, cataracts, chronic obstructive bronchitis, asthma, arthritis, history of sleep apnea are also affecting patient's functional outcome.   REHAB POTENTIAL: Good  CLINICAL DECISION MAKING: Evolving/moderate complexity  EVALUATION COMPLEXITY: Moderate  GOALS: Goals reviewed with patient? Yes  SHORT TERM GOALS: Target date:  04/24/2024  Patient will show compliance with initial HEP. Baseline: Goal status: ongoing, 03/21/2024  2.  Patient will report overall pain levels no greater than 5/10. Baseline:  Goal status: ongoing, 03/21/2024  3.  Patient will gain full PROM and AAROM of Lt shoulder to improve functional mobility. Baseline:  Goal status: ongoing, 03/21/2024  4.  Patient will increase PSFS to at least 3.8 to show a significant improvement to subjective disability rating.  Baseline:  Goal status: ongoing, 03/21/2024   LONG TERM GOALS: Target date: 06/12/2024  Patient will be independent with final HEP in order to maintain and progress upon functional gains made within PT. Baseline:  Goal status: ongoing, 03/21/2024  2.  Patient will report overall pain levels no greater than 2/10. Baseline:  Goal status: ongoing, 03/21/2024  3.   Patient will increase PSFS to at least 5.8 to show a significant improvement to subjective disability rating.  Baseline:  Goal status: ongoing, 03/21/2024  4.  Patient will return to full AROM of Lt shoulder in order to improve overall functional mobility.  Baseline:  Goal status: ongoing, 03/21/2024  5.  Patient will show strength in all motions in Lt shoulder to at least 4-/5 in order to show improved biomechanics with functional mobility.  Baseline:  Goal status: ongoing, 03/21/2024  6.  Patient will report improvement in overall sleep through the night in order to show improved overall quality of life. Baseline:  Goal status: ongoing, 03/21/2024  PLAN: PT FREQUENCY: 2-3x/wk for 6 weeks, 1-2x/wk for 6 weeks   PT DURATION: 12 weeks  PLANNED INTERVENTIONS: [id number]- PT Re-evaluation, 97750- Physical Performance Testing, 97110-Therapeutic exercises, 97530- Therapeutic activity, [id number]- Neuromuscular re-education, 97535- Self Care, [id number]- Manual therapy, [id number]- Gait training, [phone]- Canalith repositioning, [id number]- Electrical stimulation (unattended), [id number]- Electrical stimulation  (manual), [id number]- Vasopneumatic device, [id number]- Ultrasound, [id number]- Traction (mechanical), [id number]- Ionotophoresis 4mg /ml Dexamethasone , [id number] (1-2 muscles), [id number] (3+ muscles)- Dry Needling, Patient/Family education, Balance training, Stair training, Taping, Joint mobilization, Joint manipulation, Spinal manipulation, Spinal mobilization, Scar mobilization, Vestibular training, DME instructions, Cryotherapy, and Moist heat  PLAN FOR NEXT SESSION:  ROM and strength progressions as able   Left shoulder AAROM and PROM for 3 week then ok for strengthening (starting 04/02/24)  Susannah Daring, PT, DPT 04/07/24 3:11 PM

## 2024-04-08 NOTE — Therapy (Signed)
 OUTPATIENT PHYSICAL THERAPY TREATMENT   Patient Name: Frances Nelson MRN: [id number] DOB:[birth year], 68 y.o., female Today's Date: 04/09/2024  END OF SESSION:  PT End of Session - 04/09/24 1148     Visit Number 7    Number of Visits 30    Date for Recertification  06/12/24    Authorization Type [employer] [place]    Progress Note Due on Visit 10    PT Start Time 1148    Activity Tolerance Patient limited by pain    Behavior During Therapy [facility] for tasks assessed/performed             Past Medical History:  Diagnosis Date   Allergy 01.01.1975   Ambulates with cane    straight cane   Arthritis    Asthma    Bronchitis, obstructive, chronic (HCC)    hx   Cataracts, bilateral    pt has not had surgery to remove as of 02/15/24   Complication of anesthesia    patient states slow to wake up after anesthesia   COPD (chronic obstructive pulmonary disease) (HCC)    Dyspnea    only with exertion   Fibromyalgia    GERD (gastroesophageal reflux disease)    Headache    HLD (hyperlipidemia)    Hypertension    Pneumonia    x several   Past Surgical History:  Procedure Laterality Date   ABDOMINAL HYSTERECTOMY  8.19.1990   APPENDECTOMY  9.1.1980   BICEPT TENODESIS Left 02/21/2024   Procedure: TENODESIS, BICEPS;  Surgeon: Addie Cordella Hamilton, MD;  Location: [facility] OR;  Service: Orthopedics;  Laterality: Left;   CYST EXCISION     upper right side lateral - benign   HERNIA REPAIR     Umbilical x 3   KNEE ARTHROSCOPY W/ MENISCAL REPAIR Left 12/2022   POSTERIOR LUMBAR FUSION 2 WITH HARDWARE REMOVAL Left 02/21/2024   Procedure: ARTHROSCOPY, SHOULDER WITH DEBRIDEMENT;  Surgeon: Addie Cordella Hamilton, MD;  Location: [facility] OR;  Service: Orthopedics;  Laterality: Left;  left shoulder arthroscopy, debridement, biceps tenodesis, mini open rotator cuff tear repair   SHOULDER OPEN ROTATOR CUFF REPAIR Left 02/21/2024   Procedure: REPAIR, ROTATOR CUFF, OPEN;  Surgeon: Addie Cordella Hamilton, MD;  Location: [facility] OR;  Service: Orthopedics;  Laterality: Left;   SPINE SURGERY  05/2021   TONSILLECTOMY     TRANSFORAMINAL LUMBAR INTERBODY FUSION (TLIF) WITH PEDICLE SCREW FIXATION 1 LEVEL N/A 05/18/2022   Procedure: Lumbar Four-Five Open Laminectomy/Transforaminal Lumbar Interbody Fusion/Posterolateral fusion;  Surgeon: Cheryle Debby LABOR, MD;  Location: [facility] OR;  Service: Neurosurgery;  Laterality: N/A;   Patient Active Problem List   Diagnosis Date Noted   Nail disorder 04/05/2024   Synovitis of left shoulder 03/09/2024   Biceps tendonitis on left 03/09/2024   Degenerative superior labral anterior-to-posterior (SLAP) tear of left shoulder 03/09/2024   Complete tear of left rotator cuff 03/09/2024   Obesity 10/13/2023   Shingles outbreak 05/29/2023   Microhematuria 04/09/2023   Bilateral shoulder pain 04/09/2023   Urethritis 10/05/2022   External otitis of left ear 10/05/2022   Spondylolisthesis of lumbar region 05/18/2022   Smoker 04/08/2022   Deviated nasal septum 04/08/2022   Bilateral pain of leg and foot 04/08/2022   Follicular acne 04/06/2022   Baker cyst, left 04/06/2022   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   Depression 10/04/2021   Left knee pain 10/04/2021   Chronic pain 04/09/2021   Pelvic pain 04/09/2021   HLD (hyperlipidemia)  04/09/2021   Vitamin D  deficiency 04/09/2021   Estrogen deficiency 04/09/2021   Hyperglycemia 04/01/2021   Chronic cough 12/30/2020   GERD (gastroesophageal reflux disease) 11/06/2019   Bilateral leg edema 10/16/2019   Pleuritic chest pain 10/16/2019   Hypertension 10/02/2019   Arthralgia 06/28/2018   Sinusitis 06/28/2018   Renal cyst 05/03/2018   Syncope 05/03/2018   Stress and adjustment reaction 05/03/2018   History of tobacco use 05/02/2018   Primary osteoarthritis of both hands 04/10/2018   Primary osteoarthritis of both knees 04/10/2018   Primary osteoarthritis of both feet  04/10/2018   Achilles tendinitis 04/02/2018   Screening for breast cancer 03/15/2018   Positive ANA (antinuclear antibody) 03/15/2018   Screening for colon cancer 03/15/2018   Rash 03/15/2018   Achilles tendon pain 11/13/2017   Cervical myelopathy with cervical radiculopathy (HCC) 11/13/2017   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   Allergic rhinitis 06/19/2017   Edema 12/24/2015   Carpal tunnel syndrome, right 12/16/2015   Hypercalcemia 11/11/2015   Pain in both upper extremities 11/11/2015   Drug reaction 10/28/2015   Muscle spasms of both lower extremities 10/28/2015   Nerve pain 10/28/2015   Chronic asthmatic bronchitis with acute exacerbation (HCC) 09/06/2015   Environmental and seasonal allergies 07/17/2014    PCP: Lynwood LELON Rush, MD   REFERRING PROVIDER: Cordella Glendia Hutchinson, MD   REFERRING DIAG: [phone] (ICD-10-CM) - S/P rotator cuff repair  THERAPY DIAG:  Chronic left shoulder pain  Stiffness of left shoulder, not elsewhere classified  Muscle weakness (generalized)  Localized edema  Other abnormalities of gait and mobility  Other muscle spasm  Rationale for Evaluation and Treatment: Rehabilitation  ONSET DATE: 02/21/2024 left shoulder arthroscopy with debridement biceps tenodesis rotator cuff tear repair with a 2 x 3 construct  SUBJECTIVE:                                                                                                                                                                                      SUBJECTIVE STATEMENT: Patient reporting having a bad day and pain levels are still pretty high.    Hand dominance: Right  PERTINENT HISTORY: Patient endorsed having gone through PT and multiple shoulder injection prior to appointment with Dr. Hutchinson, who scheduled and performed her surgery. Patient notes that her original pain started in Feb 2025, though did have a fall down stairs Feb 2024 and is not sure if that is when she injured shoulder as she  did not have pain at that time. Patient used CPM daily and wears sling only in public at this time, but is working on weaning from both. Patient endorses difficulty with sleeping and  can average 5 hours per night when not in so much pain. Patient has undergone knee surgery (2023), back surgery (2024), and also currently has bilat carpal tunnel with n/t in fingers and hands.  PAIN:   NPRS scale: 6/10 Pain location: Lt shoulder/arm Pain description: all pain descriptors, but most of the time achey Aggravating factors: moving a certain way, reaching, trying to do hair, picking something up   Relieving factors: tylenol  arthritis twice a day, tramadol  as needed (mid morning, before bed), muscle relaxer, arnica herb (gel for bruising and pain), lidocaine  patches, ice    PRECAUTIONS: Shoulder  RED FLAGS: None   WEIGHT BEARING RESTRICTIONS: Yes Lt shoulder   FALLS:  Has patient fallen in last 6 months? Yes. Number of falls 1, slipped in the bath tub   LIVING ENVIRONMENT: Lives with: lives with their family and lives with their son Lives in: House/apartment Stairs: Yes: Internal: 15 steps; on left going up and External: 3 steps; none Has following equipment at home: Single point cane, Quad cane small base, Walker - 2 wheeled, shower chair, Shower bench, and Grab bars  OCCUPATION: Retired; Print production planner   PLOF: Independent and Independent with community mobility with device  PATIENT GOALS: to be able to use my arm again   NEXT MD VISIT: April 16, 2024 with Dr. Addie   OBJECTIVE:  Note: Objective measures were completed at Evaluation unless otherwise noted.  DIAGNOSTIC FINDINGS:    PATIENT SURVEYS :  PSFS: THE PATIENT SPECIFIC FUNCTIONAL SCALE  Place score of 0-10 (0 = unable to perform activity and 10 = able to perform activity at the same level as before injury or problem)  Activity Date: 03/20/2024    Get dressed and putting shoes on  4    2. Brush and style your hair /  showering  1    3. Cook  2    4. Washing dishes  1     5. Clean, sweep, mop  1    Total Score 1.8      Total Score = Sum of activity scores/number of activities  Minimally Detectable Change: 3 points (for single activity); 2 points (for average score)  [facility] (nd). The Patient Specific Functional Scale . Retrieved from [email or web address]   COGNITION: Overall cognitive status: Within functional limits for tasks assessed     SENSATION: Light touch: diminished in fingers secondary to carpal tunnel Carpal tunnel leading to n/t in fingers;   POSTURE: Increased thoracic kyphosis, forward head, and forward rounded shoulders   UPPER EXTREMITY ROM:    ROM Right Eval 03/20/2024 Left Eval 03/20/2024 Left 04/04/24  Shoulder flexion WFL 85 AAROM, highly painful A: 15  (supine)  Shoulder extension     Shoulder abduction WFL 60 AAROM, highly painful   Shoulder adduction     Shoulder internal rotation T8 75 AAROM at 45deg abd in supine   Shoulder external rotation T1 23 AAROM at 45deg abd in supine   (Blank rows = not tested)  UPPER EXTREMITY MMT: unable to perform on eval due to protocol restrictions  MMT Right Eval 03/20/2024 Left Eval 03/20/2024  Shoulder flexion    Shoulder extension    Shoulder abduction    Shoulder adduction    Shoulder internal rotation    Shoulder external rotation    Middle trapezius    Lower trapezius    Elbow flexion    Elbow extension    Wrist flexion    Wrist extension  Wrist ulnar deviation    Wrist radial deviation    Wrist pronation    Wrist supination    Grip strength (lbs)    (Blank rows = not tested)  SHOULDER SPECIAL TESTS: Did not perform on eval   JOINT MOBILITY TESTING:  Not assessed on eval   PALPATION:  Not assessed one val     TREATMENT DATE:  04/09/2024 TherEx:  Pulleys for ROM: shoulder flexion for 3 minutes, shoulder scaption for 3 minutes  Seated  circles with UE ranger 2x1 minute CW and CCW  ER walkouts with yellow TB 2x5 with 2-3s hold with intermittent tactile cues for appropriate form PT discussed HEP and how to attain appropriate contraction for isometric strengthening without causing pain  TherAct: AAROM shoulder flexion to first and second shelves in cabinet with AROM eccentric movement 2x5 Patient able to increase challenge by completing AROM concentric with intermittent Rt UE use   Vaso: Medium compression to Lt shoulder for 10 minutes at 34deg    04/07/2024 TherEx:  Pulleys for ROM: shoulder flexion for 3 minutes, shoulder scaption for 3 minutes  Supine chest press with 2# bar 1x8  Supine AAROM shoulder flexion with 2# bar 1x5 PT discussed HEP with patient   Manual:  PROM abduction, attempted inferior glide but patient notes discomfort so discontinued glide, then ER/IR with posterior glide   Vaso:  Medium compression to Lt shoulder for 10 minutes at 34deg   04/04/24 ROM Measurements - see above for details Supine AA chest press 2# bar 2x10 Supine AA ER 2# bar; Lt 2x10 Supine shoulder circles AA to A flexion with elbow flexed x 5 reps - pain with eccentric return Sidelying ER 2x10 on Lt active Seated scapular retraction x10; 5 sec hold  Supine PROM with gentle overpressure all directions Lt shoulder  Vaso x 10 min low pressure; Lt shoulder sitting    04/02/2024 Manual:  PROM with distraction for shoulder flexion and abduction   TherEx: Supine AAROM chest press with 1# bar 2x6  Seated AAROM ER with 1# bar 1x10 with 3s hold  Isometrics in doorway to begin shoulder strengthening  1x12 with 2-3s hold in flexion, abduction, extension, IR, ER  Intermittent verbal cues for appropriate form   Vaso:  Medium compression to Lt shoulder for 10 minutes at 34deg    PATIENT EDUCATION: Education details: HEP, POC, typical recovery from rotator cuff repair Person educated: Patient and Child(ren) Education method:  Explanation, Demonstration, Verbal cues, and Handouts Education comprehension: verbalized understanding  HOME EXERCISE PROGRAM: Access Code: [email or web address] URL: [email or web address] Date: 03/20/2024 Prepared by: Susannah Daring  Exercises - Circular Shoulder Pendulum with Table Support  - 1 x daily - 7 x weekly - 3 sets - 10 reps - Flexion-Extension Shoulder Pendulum with Table Support  - 1 x daily - 7 x weekly - 3 sets - 10 reps - Horizontal Shoulder Pendulum with Table Support  - 1 x daily - 7 x weekly - 3 sets - 10 reps - Seated Scapular Retraction  - 1 x daily - 7 x weekly - 3 sets - 10 reps - Seated AAROM Shoulder Elevation/Depression  - 1 x daily - 7 x weekly - 3 sets - 10 reps  ASSESSMENT:  CLINICAL IMPRESSION:  Patient arrived to session noting continued consistent pain that is worse when lying down. Patient tolerated all activities this date and was able to progress to more against gravity ROM activities. Patient will continue to benefit from skilled PT.  OBJECTIVE IMPAIRMENTS: decreased activity tolerance, decreased coordination, decreased endurance, decreased mobility, decreased ROM, decreased strength, decreased safety awareness, hypomobility, increased edema, impaired flexibility, impaired sensation, impaired UE functional use, improper body mechanics, postural dysfunction, obesity, and pain.   ACTIVITY LIMITATIONS: carrying, lifting, sleeping, bed mobility, bathing, dressing, reach over head, and hygiene/grooming  PARTICIPATION LIMITATIONS: meal prep, cleaning, driving, community activity, and yard work  PERSONAL FACTORS: Past/current experiences, Time since onset of injury/illness/exacerbation, and 3+ comorbidities: HTN, hyperlipidemia, GERD, fibromyalgia, COPD, dyspnea, cataracts, chronic obstructive bronchitis, asthma, arthritis, history of sleep apnea are also affecting patient's functional outcome.   REHAB POTENTIAL: Good  CLINICAL DECISION MAKING:  Evolving/moderate complexity  EVALUATION COMPLEXITY: Moderate  GOALS: Goals reviewed with patient? Yes  SHORT TERM GOALS: Target date: 04/24/2024  Patient will show compliance with initial HEP. Baseline: Goal status: ongoing, 03/21/2024  2.  Patient will report overall pain levels no greater than 5/10. Baseline:  Goal status: ongoing, 03/21/2024  3.  Patient will gain full PROM and AAROM of Lt shoulder to improve functional mobility. Baseline:  Goal status: ongoing, 03/21/2024  4.  Patient will increase PSFS to at least 3.8 to show a significant improvement to subjective disability rating.  Baseline:  Goal status: ongoing, 03/21/2024   LONG TERM GOALS: Target date: 06/12/2024  Patient will be independent with final HEP in order to maintain and progress upon functional gains made within PT. Baseline:  Goal status: ongoing, 03/21/2024  2.  Patient will report overall pain levels no greater than 2/10. Baseline:  Goal status: ongoing, 03/21/2024  3.   Patient will increase PSFS to at least 5.8 to show a significant improvement to subjective disability rating.  Baseline:  Goal status: ongoing, 03/21/2024  4.  Patient will return to full AROM of Lt shoulder in order to improve overall functional mobility.  Baseline:  Goal status: ongoing, 03/21/2024  5.  Patient will show strength in all motions in Lt shoulder to at least 4-/5 in order to show improved biomechanics with functional mobility.  Baseline:  Goal status: ongoing, 03/21/2024  6.  Patient will report improvement in overall sleep through the night in order to show improved overall quality of life. Baseline:  Goal status: ongoing, 03/21/2024  PLAN: PT FREQUENCY: 2-3x/wk for 6 weeks, 1-2x/wk for 6 weeks   PT DURATION: 12 weeks  PLANNED INTERVENTIONS: [id number]- PT Re-evaluation, 97750- Physical Performance Testing, 97110-Therapeutic exercises, 97530- Therapeutic activity, [id number]- Neuromuscular re-education, 97535- Self Care, [id number]-  Manual therapy, [id number]- Gait training, [phone]- Canalith repositioning, H9716- Electrical stimulation (unattended), [phone]- Electrical stimulation (manual), [id number]- Vasopneumatic device, [id number]- Ultrasound, [id number]- Traction (mechanical), [id number]- Ionotophoresis 4mg /ml Dexamethasone , [id number] (1-2 muscles), [id number] (3+ muscles)- Dry Needling, Patient/Family education, Balance training, Stair training, Taping, Joint mobilization, Joint manipulation, Spinal manipulation, Spinal mobilization, Scar mobilization, Vestibular training, DME instructions, Cryotherapy, and Moist heat  PLAN FOR NEXT SESSION:  ROM and strength progressions as able   Left shoulder AAROM and PROM for 3 week then ok for strengthening (starting 04/02/24)  Susannah Daring, PT, DPT 04/09/24 11:53 AM

## 2024-04-09 ENCOUNTER — Ambulatory Visit (INDEPENDENT_AMBULATORY_CARE_PROVIDER_SITE_OTHER)

## 2024-04-09 ENCOUNTER — Ambulatory Visit: Payer: Medicare Other | Admitting: Rheumatology

## 2024-04-09 DIAGNOSIS — R600 Localized edema: Secondary | ICD-10-CM | POA: Diagnosis not present

## 2024-04-09 DIAGNOSIS — G8929 Other chronic pain: Secondary | ICD-10-CM

## 2024-04-09 DIAGNOSIS — M62838 Other muscle spasm: Secondary | ICD-10-CM

## 2024-04-09 DIAGNOSIS — M6281 Muscle weakness (generalized): Secondary | ICD-10-CM

## 2024-04-09 DIAGNOSIS — M25512 Pain in left shoulder: Secondary | ICD-10-CM

## 2024-04-09 DIAGNOSIS — M25612 Stiffness of left shoulder, not elsewhere classified: Secondary | ICD-10-CM | POA: Diagnosis not present

## 2024-04-09 DIAGNOSIS — R2689 Other abnormalities of gait and mobility: Secondary | ICD-10-CM

## 2024-04-09 NOTE — Therapy (Signed)
 OUTPATIENT PHYSICAL THERAPY TREATMENT   Patient Name: Frances Nelson MRN: [id number] DOB:[birth year], 68 y.o., female Today's Date: 04/11/2024  END OF SESSION:  PT End of Session - 04/11/24 1016     Visit Number 8    Number of Visits 30    Date for Recertification  06/12/24    Authorization Type [employer] [place]    Progress Note Due on Visit 10    PT Start Time 1016    PT Stop Time 1105    PT Time Calculation (min) 49 min    Activity Tolerance Patient limited by pain    Behavior During Therapy [facility] for tasks assessed/performed            Past Medical History:  Diagnosis Date   Allergy 01.01.1975   Ambulates with cane    straight cane   Arthritis    Asthma    Bronchitis, obstructive, chronic (HCC)    hx   Cataracts, bilateral    pt has not had surgery to remove as of 02/15/24   Complication of anesthesia    patient states slow to wake up after anesthesia   COPD (chronic obstructive pulmonary disease) (HCC)    Dyspnea    only with exertion   Fibromyalgia    GERD (gastroesophageal reflux disease)    Headache    HLD (hyperlipidemia)    Hypertension    Pneumonia    x several   Past Surgical History:  Procedure Laterality Date   ABDOMINAL HYSTERECTOMY  8.19.1990   APPENDECTOMY  9.1.1980   BICEPT TENODESIS Left 02/21/2024   Procedure: TENODESIS, BICEPS;  Surgeon: Addie Cordella Hamilton, MD;  Location: [facility] OR;  Service: Orthopedics;  Laterality: Left;   CYST EXCISION     upper right side lateral - benign   HERNIA REPAIR     Umbilical x 3   KNEE ARTHROSCOPY W/ MENISCAL REPAIR Left 12/2022   POSTERIOR LUMBAR FUSION 2 WITH HARDWARE REMOVAL Left 02/21/2024   Procedure: ARTHROSCOPY, SHOULDER WITH DEBRIDEMENT;  Surgeon: Addie Cordella Hamilton, MD;  Location: [facility] OR;  Service: Orthopedics;  Laterality: Left;  left shoulder arthroscopy, debridement, biceps tenodesis, mini open rotator cuff tear repair   SHOULDER OPEN ROTATOR CUFF REPAIR Left 02/21/2024   Procedure:  REPAIR, ROTATOR CUFF, OPEN;  Surgeon: Addie Cordella Hamilton, MD;  Location: [facility] OR;  Service: Orthopedics;  Laterality: Left;   SPINE SURGERY  05/2021   TONSILLECTOMY     TRANSFORAMINAL LUMBAR INTERBODY FUSION (TLIF) WITH PEDICLE SCREW FIXATION 1 LEVEL N/A 05/18/2022   Procedure: Lumbar Four-Five Open Laminectomy/Transforaminal Lumbar Interbody Fusion/Posterolateral fusion;  Surgeon: Cheryle Debby LABOR, MD;  Location: [facility] OR;  Service: Neurosurgery;  Laterality: N/A;   Patient Active Problem List   Diagnosis Date Noted   Nail disorder 04/05/2024   Synovitis of left shoulder 03/09/2024   Biceps tendonitis on left 03/09/2024   Degenerative superior labral anterior-to-posterior (SLAP) tear of left shoulder 03/09/2024   Complete tear of left rotator cuff 03/09/2024   Obesity 10/13/2023   Shingles outbreak 05/29/2023   Microhematuria 04/09/2023   Bilateral shoulder pain 04/09/2023   Urethritis 10/05/2022   External otitis of left ear 10/05/2022   Spondylolisthesis of lumbar region 05/18/2022   Smoker 04/08/2022   Deviated nasal septum 04/08/2022   Bilateral pain of leg and foot 04/08/2022   Follicular acne 04/06/2022   Baker cyst, left 04/06/2022   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   Depression 10/04/2021   Left knee pain  10/04/2021   Chronic pain 04/09/2021   Pelvic pain 04/09/2021   HLD (hyperlipidemia) 04/09/2021   Vitamin D  deficiency 04/09/2021   Estrogen deficiency 04/09/2021   Hyperglycemia 04/01/2021   Chronic cough 12/30/2020   GERD (gastroesophageal reflux disease) 11/06/2019   Bilateral leg edema 10/16/2019   Pleuritic chest pain 10/16/2019   Hypertension 10/02/2019   Arthralgia 06/28/2018   Sinusitis 06/28/2018   Renal cyst 05/03/2018   Syncope 05/03/2018   Stress and adjustment reaction 05/03/2018   History of tobacco use 05/02/2018   Primary osteoarthritis of both hands 04/10/2018   Primary osteoarthritis of both knees  04/10/2018   Primary osteoarthritis of both feet 04/10/2018   Achilles tendinitis 04/02/2018   Screening for breast cancer 03/15/2018   Positive ANA (antinuclear antibody) 03/15/2018   Screening for colon cancer 03/15/2018   Rash 03/15/2018   Achilles tendon pain 11/13/2017   Cervical myelopathy with cervical radiculopathy (HCC) 11/13/2017   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   Allergic rhinitis 06/19/2017   Edema 12/24/2015   Carpal tunnel syndrome, right 12/16/2015   Hypercalcemia 11/11/2015   Pain in both upper extremities 11/11/2015   Drug reaction 10/28/2015   Muscle spasms of both lower extremities 10/28/2015   Nerve pain 10/28/2015   Chronic asthmatic bronchitis with acute exacerbation (HCC) 09/06/2015   Environmental and seasonal allergies 07/17/2014    PCP: Lynwood LELON Rush, MD   REFERRING PROVIDER: Cordella Glendia Hutchinson, MD   REFERRING DIAG: [phone] (ICD-10-CM) - S/P rotator cuff repair  THERAPY DIAG:  Chronic left shoulder pain  Stiffness of left shoulder, not elsewhere classified  Muscle weakness (generalized)  Localized edema  Other abnormalities of gait and mobility  Other muscle spasm  Rationale for Evaluation and Treatment: Rehabilitation  ONSET DATE: 02/21/2024 left shoulder arthroscopy with debridement biceps tenodesis rotator cuff tear repair with a 2 x 3 construct  SUBJECTIVE:                                                                                                                                                                                      SUBJECTIVE STATEMENT: Patient reporting better day though continued soreness, rated 5/10 this morning.    Hand dominance: Right  PERTINENT HISTORY: Patient endorsed having gone through PT and multiple shoulder injection prior to appointment with Dr. Hutchinson, who scheduled and performed her surgery. Patient notes that her original pain started in Feb 2025, though did have a fall down stairs Feb 2024 and  is not sure if that is when she injured shoulder as she did not have pain at that time. Patient used CPM daily and wears sling only in public at this time,  but is working on weaning from both. Patient endorses difficulty with sleeping and can average 5 hours per night when not in so much pain. Patient has undergone knee surgery (2023), back surgery (2024), and also currently has bilat carpal tunnel with n/t in fingers and hands.  PAIN:   NPRS scale: 6/10 Pain location: Lt shoulder/arm Pain description: all pain descriptors, but most of the time achey Aggravating factors: moving a certain way, reaching, trying to do hair, picking something up   Relieving factors: tylenol  arthritis twice a day, tramadol  as needed (mid morning, before bed), muscle relaxer, arnica herb (gel for bruising and pain), lidocaine  patches, ice    PRECAUTIONS: Shoulder  RED FLAGS: None   WEIGHT BEARING RESTRICTIONS: Yes Lt shoulder   FALLS:  Has patient fallen in last 6 months? Yes. Number of falls 1, slipped in the bath tub   LIVING ENVIRONMENT: Lives with: lives with their family and lives with their son Lives in: House/apartment Stairs: Yes: Internal: 15 steps; on left going up and External: 3 steps; none Has following equipment at home: Single point cane, Quad cane small base, Walker - 2 wheeled, shower chair, Shower bench, and Grab bars  OCCUPATION: Retired; Print production planner   PLOF: Independent and Independent with community mobility with device  PATIENT GOALS: to be able to use my arm again   NEXT MD VISIT: April 16, 2024 with Dr. Addie   OBJECTIVE:  Note: Objective measures were completed at Evaluation unless otherwise noted.  DIAGNOSTIC FINDINGS:    PATIENT SURVEYS :  PSFS: THE PATIENT SPECIFIC FUNCTIONAL SCALE  Place score of 0-10 (0 = unable to perform activity and 10 = able to perform activity at the same level as before injury or problem)  Activity Date: 03/20/2024    Get dressed and putting  shoes on  4    2. Brush and style your hair / showering  1    3. Cook  2    4. Washing dishes  1     5. Clean, sweep, mop  1    Total Score 1.8      Total Score = Sum of activity scores/number of activities  Minimally Detectable Change: 3 points (for single activity); 2 points (for average score)  [facility] (nd). The Patient Specific Functional Scale . Retrieved from [email or web address]   COGNITION: Overall cognitive status: Within functional limits for tasks assessed     SENSATION: Light touch: diminished in fingers secondary to carpal tunnel Carpal tunnel leading to n/t in fingers;   POSTURE: Increased thoracic kyphosis, forward head, and forward rounded shoulders   UPPER EXTREMITY ROM:    ROM Right Eval 03/20/2024 Left Eval 03/20/2024 Left 04/04/24  Shoulder flexion WFL 85 AAROM, highly painful A: 15  (supine)  Shoulder extension     Shoulder abduction WFL 60 AAROM, highly painful   Shoulder adduction     Shoulder internal rotation T8 75 AAROM at 45deg abd in supine   Shoulder external rotation T1 23 AAROM at 45deg abd in supine   (Blank rows = not tested)  UPPER EXTREMITY MMT: unable to perform on eval due to protocol restrictions  MMT Right Eval 03/20/2024 Left Eval 03/20/2024  Shoulder flexion    Shoulder extension    Shoulder abduction    Shoulder adduction    Shoulder internal rotation    Shoulder external rotation    Middle trapezius    Lower trapezius    Elbow flexion    Elbow extension  Wrist flexion    Wrist extension    Wrist ulnar deviation    Wrist radial deviation    Wrist pronation    Wrist supination    Grip strength (lbs)    (Blank rows = not tested)  SHOULDER SPECIAL TESTS: Did not perform on eval   JOINT MOBILITY TESTING:  Not assessed on eval   PALPATION:  Not assessed one val     TREATMENT DATE:  04/11/2024 TherEx:  Pulleys for ROM: shoulder flexion for 3  minutes, shoulder scaption for 3 minutes Sidelying ER AROM 2x14  Sidelying abduction AROM 1x8 with pain/discomfort with eccentric motion  Supine shoulder flexion AROM 1x2 ; discontinued secondary to pain and discomfort   TherAct:  Seated circles with UE ranger 2x1 minute CW and CCW  AAROM shoulder scaption to first and second shelf in cabinet with AROM eccentric motion 1x3 , 1x5   Vaso:  Medium compression to Lt shoulder for 10 minutes at 34deg    04/09/2024 TherEx:  Pulleys for ROM: shoulder flexion for 3 minutes, shoulder scaption for 3 minutes  Seated circles with UE ranger 2x1 minute CW and CCW  ER walkouts with yellow TB 2x5 with 2-3s hold with intermittent tactile cues for appropriate form PT discussed HEP and how to attain appropriate contraction for isometric strengthening without causing pain  TherAct: AAROM shoulder flexion to first and second shelves in cabinet with AROM eccentric movement 2x5 Patient able to increase challenge by completing AROM concentric with intermittent Rt UE use   Vaso: Medium compression to Lt shoulder for 10 minutes at 34deg    04/07/2024 TherEx:  Pulleys for ROM: shoulder flexion for 3 minutes, shoulder scaption for 3 minutes  Supine chest press with 2# bar 1x8  Supine AAROM shoulder flexion with 2# bar 1x5 PT discussed HEP with patient   Manual:  PROM abduction, attempted inferior glide but patient notes discomfort so discontinued glide, then ER/IR with posterior glide   Vaso:  Medium compression to Lt shoulder for 10 minutes at 34deg   04/04/24 ROM Measurements - see above for details Supine AA chest press 2# bar 2x10 Supine AA ER 2# bar; Lt 2x10 Supine shoulder circles AA to A flexion with elbow flexed x 5 reps - pain with eccentric return Sidelying ER 2x10 on Lt active Seated scapular retraction x10; 5 sec hold  Supine PROM with gentle overpressure all directions Lt shoulder  Vaso x 10 min low pressure; Lt shoulder  sitting      PATIENT EDUCATION: Education details: HEP, POC, typical recovery from rotator cuff repair Person educated: Patient and Child(ren) Education method: Explanation, Demonstration, Verbal cues, and Handouts Education comprehension: verbalized understanding  HOME EXERCISE PROGRAM: Access Code: [email or web address] URL: [email or web address] Date: 03/20/2024 Prepared by: Susannah Daring  Exercises - Circular Shoulder Pendulum with Table Support  - 1 x daily - 7 x weekly - 3 sets - 10 reps - Flexion-Extension Shoulder Pendulum with Table Support  - 1 x daily - 7 x weekly - 3 sets - 10 reps - Horizontal Shoulder Pendulum with Table Support  - 1 x daily - 7 x weekly - 3 sets - 10 reps - Seated Scapular Retraction  - 1 x daily - 7 x weekly - 3 sets - 10 reps - Seated AAROM Shoulder Elevation/Depression  - 1 x daily - 7 x weekly - 3 sets - 10 reps  ASSESSMENT:  CLINICAL IMPRESSION:  Patient arrived to session noting improved overall wellbeing, though continues to have 5/10  soreness in Rt shoulder. Patient tolerated all activities this date with difficulty/discomfort with eccentric AROM. Patient will continue to benefit from skilled PT.     OBJECTIVE IMPAIRMENTS: decreased activity tolerance, decreased coordination, decreased endurance, decreased mobility, decreased ROM, decreased strength, decreased safety awareness, hypomobility, increased edema, impaired flexibility, impaired sensation, impaired UE functional use, improper body mechanics, postural dysfunction, obesity, and pain.   ACTIVITY LIMITATIONS: carrying, lifting, sleeping, bed mobility, bathing, dressing, reach over head, and hygiene/grooming  PARTICIPATION LIMITATIONS: meal prep, cleaning, driving, community activity, and yard work  PERSONAL FACTORS: Past/current experiences, Time since onset of injury/illness/exacerbation, and 3+ comorbidities: HTN, hyperlipidemia, GERD, fibromyalgia, COPD, dyspnea, cataracts,  chronic obstructive bronchitis, asthma, arthritis, history of sleep apnea are also affecting patient's functional outcome.   REHAB POTENTIAL: Good  CLINICAL DECISION MAKING: Evolving/moderate complexity  EVALUATION COMPLEXITY: Moderate  GOALS: Goals reviewed with patient? Yes  SHORT TERM GOALS: Target date: 04/24/2024  Patient will show compliance with initial HEP. Baseline: Goal status: ongoing, 03/21/2024  2.  Patient will report overall pain levels no greater than 5/10. Baseline:  Goal status: ongoing, 03/21/2024  3.  Patient will gain full PROM and AAROM of Lt shoulder to improve functional mobility. Baseline:  Goal status: ongoing, 03/21/2024  4.  Patient will increase PSFS to at least 3.8 to show a significant improvement to subjective disability rating.  Baseline:  Goal status: ongoing, 03/21/2024   LONG TERM GOALS: Target date: 06/12/2024  Patient will be independent with final HEP in order to maintain and progress upon functional gains made within PT. Baseline:  Goal status: ongoing, 03/21/2024  2.  Patient will report overall pain levels no greater than 2/10. Baseline:  Goal status: ongoing, 03/21/2024  3.   Patient will increase PSFS to at least 5.8 to show a significant improvement to subjective disability rating.  Baseline:  Goal status: ongoing, 03/21/2024  4.  Patient will return to full AROM of Lt shoulder in order to improve overall functional mobility.  Baseline:  Goal status: ongoing, 03/21/2024  5.  Patient will show strength in all motions in Lt shoulder to at least 4-/5 in order to show improved biomechanics with functional mobility.  Baseline:  Goal status: ongoing, 03/21/2024  6.  Patient will report improvement in overall sleep through the night in order to show improved overall quality of life. Baseline:  Goal status: ongoing, 03/21/2024  PLAN: PT FREQUENCY: 2-3x/wk for 6 weeks, 1-2x/wk for 6 weeks   PT DURATION: 12 weeks  PLANNED INTERVENTIONS:  [id number]- PT Re-evaluation, 97750- Physical Performance Testing, 97110-Therapeutic exercises, 97530- Therapeutic activity, [id number]- Neuromuscular re-education, 97535- Self Care, [id number]- Manual therapy, [id number]- Gait training, [phone]- Canalith repositioning, [id number]- Electrical stimulation (unattended), [phone]- Electrical stimulation (manual), [id number]- Vasopneumatic device, [id number]- Ultrasound, [id number]- Traction (mechanical), [id number]- Ionotophoresis 4mg /ml Dexamethasone , [id number] (1-2 muscles), [id number] (3+ muscles)- Dry Needling, Patient/Family education, Balance training, Stair training, Taping, Joint mobilization, Joint manipulation, Spinal manipulation, Spinal mobilization, Scar mobilization, Vestibular training, DME instructions, Cryotherapy, and Moist heat  PLAN FOR NEXT SESSION:   ROM and strength progressions as able   Left shoulder AAROM and PROM for 3 week then ok for strengthening (starting 04/02/24)  Susannah Daring, PT, DPT 04/11/24 12:53 PM

## 2024-04-10 ENCOUNTER — Encounter: Payer: Self-pay | Admitting: Internal Medicine

## 2024-04-11 ENCOUNTER — Ambulatory Visit (INDEPENDENT_AMBULATORY_CARE_PROVIDER_SITE_OTHER)

## 2024-04-11 DIAGNOSIS — M25612 Stiffness of left shoulder, not elsewhere classified: Secondary | ICD-10-CM

## 2024-04-11 DIAGNOSIS — G8929 Other chronic pain: Secondary | ICD-10-CM

## 2024-04-11 DIAGNOSIS — M25512 Pain in left shoulder: Secondary | ICD-10-CM

## 2024-04-11 DIAGNOSIS — R2689 Other abnormalities of gait and mobility: Secondary | ICD-10-CM

## 2024-04-11 DIAGNOSIS — M62838 Other muscle spasm: Secondary | ICD-10-CM

## 2024-04-11 DIAGNOSIS — M6281 Muscle weakness (generalized): Secondary | ICD-10-CM | POA: Diagnosis not present

## 2024-04-11 DIAGNOSIS — R600 Localized edema: Secondary | ICD-10-CM | POA: Diagnosis not present

## 2024-04-11 NOTE — Therapy (Signed)
 OUTPATIENT PHYSICAL THERAPY TREATMENT   Patient Name: Frances Nelson MRN: [id number] DOB:06-01-1956, 68 y.o., female Today's Date: 04/14/2024  END OF SESSION:  PT End of Session - 04/14/24 1144     Visit Number 9    Number of Visits 30    Date for Recertification  06/12/24    Authorization Type [employer] AND [employer] [place]    Progress Note Due on Visit 10    PT Start Time 1144    PT Stop Time 1240    PT Time Calculation (min) 56 min    Activity Tolerance Patient limited by pain    Behavior During Therapy [facility] for tasks assessed/performed             Past Medical History:  Diagnosis Date   Allergy 01.01.1975   Ambulates with cane    straight cane   Arthritis    Asthma    Bronchitis, obstructive, chronic (HCC)    hx   Cataracts, bilateral    pt has not had surgery to remove as of 02/15/24   Complication of anesthesia    patient states slow to wake up after anesthesia   COPD (chronic obstructive pulmonary disease) (HCC)    Dyspnea    only with exertion   Fibromyalgia    GERD (gastroesophageal reflux disease)    Headache    HLD (hyperlipidemia)    Hypertension    Pneumonia    x several   Past Surgical History:  Procedure Laterality Date   ABDOMINAL HYSTERECTOMY  8.19.1990   APPENDECTOMY  9.1.1980   BICEPT TENODESIS Left 02/21/2024   Procedure: TENODESIS, BICEPS;  Surgeon: Addie Cordella Hamilton, MD;  Location: [facility] OR;  Service: Orthopedics;  Laterality: Left;   CYST EXCISION     upper right side lateral - benign   HERNIA REPAIR     Umbilical x 3   KNEE ARTHROSCOPY W/ MENISCAL REPAIR Left 12/2022   POSTERIOR LUMBAR FUSION 2 WITH HARDWARE REMOVAL Left 02/21/2024   Procedure: ARTHROSCOPY, SHOULDER WITH DEBRIDEMENT;  Surgeon: Addie Cordella Hamilton, MD;  Location: [facility] OR;  Service: Orthopedics;  Laterality: Left;  left shoulder arthroscopy, debridement, biceps tenodesis, mini open rotator cuff tear repair   SHOULDER OPEN ROTATOR CUFF REPAIR Left 02/21/2024    Procedure: REPAIR, ROTATOR CUFF, OPEN;  Surgeon: Addie Cordella Hamilton, MD;  Location: [facility] OR;  Service: Orthopedics;  Laterality: Left;   SPINE SURGERY  05/2021   TONSILLECTOMY     TRANSFORAMINAL LUMBAR INTERBODY FUSION (TLIF) WITH PEDICLE SCREW FIXATION 1 LEVEL N/A 05/18/2022   Procedure: Lumbar Four-Five Open Laminectomy/Transforaminal Lumbar Interbody Fusion/Posterolateral fusion;  Surgeon: Cheryle Debby LABOR, MD;  Location: [facility] OR;  Service: Neurosurgery;  Laterality: N/A;   Patient Active Problem List   Diagnosis Date Noted   Nail disorder 04/05/2024   Synovitis of left shoulder 03/09/2024   Biceps tendonitis on left 03/09/2024   Degenerative superior labral anterior-to-posterior (SLAP) tear of left shoulder 03/09/2024   Complete tear of left rotator cuff 03/09/2024   Obesity 10/13/2023   Shingles outbreak 05/29/2023   Microhematuria 04/09/2023   Bilateral shoulder pain 04/09/2023   Urethritis 10/05/2022   External otitis of left ear 10/05/2022   Spondylolisthesis of lumbar region 05/18/2022   Smoker 04/08/2022   Deviated nasal septum 04/08/2022   Bilateral pain of leg and foot 04/08/2022   Follicular acne 04/06/2022   Baker cyst, left 04/06/2022   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   Depression 10/04/2021   Left knee  pain 10/04/2021   Chronic pain 04/09/2021   Pelvic pain 04/09/2021   HLD (hyperlipidemia) 04/09/2021   Vitamin D  deficiency 04/09/2021   Estrogen deficiency 04/09/2021   Hyperglycemia 04/01/2021   Chronic cough 12/30/2020   GERD (gastroesophageal reflux disease) 11/06/2019   Bilateral leg edema 10/16/2019   Pleuritic chest pain 10/16/2019   Hypertension 10/02/2019   Arthralgia 06/28/2018   Sinusitis 06/28/2018   Renal cyst 05/03/2018   Syncope 05/03/2018   Stress and adjustment reaction 05/03/2018   History of tobacco use 05/02/2018   Primary osteoarthritis of both hands 04/10/2018   Primary osteoarthritis of both  knees 04/10/2018   Primary osteoarthritis of both feet 04/10/2018   Achilles tendinitis 04/02/2018   Screening for breast cancer 03/15/2018   Positive ANA (antinuclear antibody) 03/15/2018   Screening for colon cancer 03/15/2018   Rash 03/15/2018   Achilles tendon pain 11/13/2017   Cervical myelopathy with cervical radiculopathy (HCC) 11/13/2017   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   Allergic rhinitis 06/19/2017   Edema 12/24/2015   Carpal tunnel syndrome, right 12/16/2015   Hypercalcemia 11/11/2015   Pain in both upper extremities 11/11/2015   Drug reaction 10/28/2015   Muscle spasms of both lower extremities 10/28/2015   Nerve pain 10/28/2015   Chronic asthmatic bronchitis with acute exacerbation (HCC) 09/06/2015   Environmental and seasonal allergies 07/17/2014    PCP: Lynwood LELON Rush, MD   REFERRING PROVIDER: Cordella Glendia Hutchinson, MD   REFERRING DIAG: [phone] (ICD-10-CM) - S/P rotator cuff repair  THERAPY DIAG:  Chronic left shoulder pain  Stiffness of left shoulder, not elsewhere classified  Muscle weakness (generalized)  Localized edema  Other abnormalities of gait and mobility  Other muscle spasm  Rationale for Evaluation and Treatment: Rehabilitation  ONSET DATE: 02/21/2024 left shoulder arthroscopy with debridement biceps tenodesis rotator cuff tear repair with a 2 x 3 construct  SUBJECTIVE:                                                                                                                                                                                      SUBJECTIVE STATEMENT: Patient arriving with no increased pain, but continued soreness. Rated 5/10 soreness.     Hand dominance: Right  PERTINENT HISTORY: Patient endorsed having gone through PT and multiple shoulder injection prior to appointment with Dr. Hutchinson, who scheduled and performed her surgery. Patient notes that her original pain started in Feb 2025, though did have a fall down stairs  Feb 2024 and is not sure if that is when she injured shoulder as she did not have pain at that time. Patient used CPM daily and wears sling only in public  at this time, but is working on weaning from both. Patient endorses difficulty with sleeping and can average 5 hours per night when not in so much pain. Patient has undergone knee surgery (2023), back surgery (2024), and also currently has bilat carpal tunnel with n/t in fingers and hands.  PAIN:   NPRS scale: 6/10 Pain location: Lt shoulder/arm Pain description: all pain descriptors, but most of the time achey Aggravating factors: moving a certain way, reaching, trying to do hair, picking something up   Relieving factors: tylenol  arthritis twice a day, tramadol  as needed (mid morning, before bed), muscle relaxer, arnica herb (gel for bruising and pain), lidocaine  patches, ice    PRECAUTIONS: Shoulder  RED FLAGS: None   WEIGHT BEARING RESTRICTIONS: Yes Lt shoulder   FALLS:  Has patient fallen in last 6 months? Yes. Number of falls 1, slipped in the bath tub   LIVING ENVIRONMENT: Lives with: lives with their family and lives with their son Lives in: House/apartment Stairs: Yes: Internal: 15 steps; on left going up and External: 3 steps; none Has following equipment at home: Single point cane, Quad cane small base, Walker - 2 wheeled, shower chair, Shower bench, and Grab bars  OCCUPATION: Retired; Print production planner   PLOF: Independent and Independent with community mobility with device  PATIENT GOALS: to be able to use my arm again   NEXT MD VISIT: April 16, 2024 with Dr. Addie   OBJECTIVE:  Note: Objective measures were completed at Evaluation unless otherwise noted.  DIAGNOSTIC FINDINGS:    PATIENT SURVEYS :  PSFS: THE PATIENT SPECIFIC FUNCTIONAL SCALE  Place score of 0-10 (0 = unable to perform activity and 10 = able to perform activity at the same level as before injury or problem)  Activity Date: 03/20/2024    Get  dressed and putting shoes on  4    2. Brush and style your hair / showering  1    3. Cook  2    4. Washing dishes  1     5. Clean, sweep, mop  1    Total Score 1.8      Total Score = Sum of activity scores/number of activities  Minimally Detectable Change: 3 points (for single activity); 2 points (for average score)  [facility] (nd). The Patient Specific Functional Scale . Retrieved from [email or web address]   COGNITION: Overall cognitive status: Within functional limits for tasks assessed     SENSATION: Light touch: diminished in fingers secondary to carpal tunnel Carpal tunnel leading to n/t in fingers;   POSTURE: Increased thoracic kyphosis, forward head, and forward rounded shoulders   UPPER EXTREMITY ROM:    ROM Right Eval 03/20/2024 Left Eval 03/20/2024 Left 04/04/24  Shoulder flexion WFL 85 AAROM, highly painful A: 15  (supine)  Shoulder extension     Shoulder abduction WFL 60 AAROM, highly painful   Shoulder adduction     Shoulder internal rotation T8 75 AAROM at 45deg abd in supine   Shoulder external rotation T1 23 AAROM at 45deg abd in supine   (Blank rows = not tested)  UPPER EXTREMITY MMT: unable to perform on eval due to protocol restrictions  MMT Right Eval 03/20/2024 Left Eval 03/20/2024  Shoulder flexion    Shoulder extension    Shoulder abduction    Shoulder adduction    Shoulder internal rotation    Shoulder external rotation    Middle trapezius    Lower trapezius    Elbow flexion  Elbow extension    Wrist flexion    Wrist extension    Wrist ulnar deviation    Wrist radial deviation    Wrist pronation    Wrist supination    Grip strength (lbs)    (Blank rows = not tested)  SHOULDER SPECIAL TESTS: Did not perform on eval   JOINT MOBILITY TESTING:  Not assessed on eval   PALPATION:  Not assessed one val     TREATMENT DATE:  04/14/2024 TherEx:  Pulleys for ROM:  shoulder flexion for 3 minutes, shoulder scaption for 3 minutes Supine PT assisted AAROM abduction 1x6 with 10 hold in max stretch each rep, able to reach 99deg on last rep Supine chest press with 2# bar 1x10  Supine shoulder flexion 2x5 with 2# bar PT discussed protocol and what to expect coming up   Vaso:   Medium compression to Lt shoulder for 10 minutes at 34deg   04/11/2024 TherEx:  Pulleys for ROM: shoulder flexion for 3 minutes, shoulder scaption for 3 minutes Sidelying ER AROM 2x14  Sidelying abduction AROM 1x8 with pain/discomfort with eccentric motion  Supine shoulder flexion AROM 1x2 ; discontinued secondary to pain and discomfort   TherAct:  Seated circles with UE ranger 2x1 minute CW and CCW  AAROM shoulder scaption to first and second shelf in cabinet with AROM eccentric motion 1x3 , 1x5   Vaso:  Medium compression to Lt shoulder for 10 minutes at 34deg    04/09/2024 TherEx:  Pulleys for ROM: shoulder flexion for 3 minutes, shoulder scaption for 3 minutes  Seated circles with UE ranger 2x1 minute CW and CCW  ER walkouts with yellow TB 2x5 with 2-3s hold with intermittent tactile cues for appropriate form PT discussed HEP and how to attain appropriate contraction for isometric strengthening without causing pain  TherAct: AAROM shoulder flexion to first and second shelves in cabinet with AROM eccentric movement 2x5 Patient able to increase challenge by completing AROM concentric with intermittent Rt UE use   Vaso: Medium compression to Lt shoulder for 10 minutes at 34deg    04/07/2024 TherEx:  Pulleys for ROM: shoulder flexion for 3 minutes, shoulder scaption for 3 minutes  Supine chest press with 2# bar 1x8  Supine AAROM shoulder flexion with 2# bar 1x5 PT discussed HEP with patient   Manual:  PROM abduction, attempted inferior glide but patient notes discomfort so discontinued glide, then ER/IR with posterior glide   Vaso:  Medium compression to Lt  shoulder for 10 minutes at 34deg      PATIENT EDUCATION: Education details: HEP, POC, typical recovery from rotator cuff repair Person educated: Patient and Child(ren) Education method: Explanation, Demonstration, Verbal cues, and Handouts Education comprehension: verbalized understanding  HOME EXERCISE PROGRAM: Access Code: [email or web address] URL: [email or web address] Date: 03/20/2024 Prepared by: Susannah Daring  Exercises - Circular Shoulder Pendulum with Table Support  - 1 x daily - 7 x weekly - 3 sets - 10 reps - Flexion-Extension Shoulder Pendulum with Table Support  - 1 x daily - 7 x weekly - 3 sets - 10 reps - Horizontal Shoulder Pendulum with Table Support  - 1 x daily - 7 x weekly - 3 sets - 10 reps - Seated Scapular Retraction  - 1 x daily - 7 x weekly - 3 sets - 10 reps - Seated AAROM Shoulder Elevation/Depression  - 1 x daily - 7 x weekly - 3 sets - 10 reps  ASSESSMENT:  CLINICAL IMPRESSION:  Patient arrived to session noting  continued soreness in Lt shoulder and attempted AROM with mug in hand to third shelf in cabinet over weekend with high difficulty and pain level. Patient making slow improvements in AAROM in gravity eliminated positions, but continues to have deficits against gravity. Patient will continue to benefit from skilled PT.     OBJECTIVE IMPAIRMENTS: decreased activity tolerance, decreased coordination, decreased endurance, decreased mobility, decreased ROM, decreased strength, decreased safety awareness, hypomobility, increased edema, impaired flexibility, impaired sensation, impaired UE functional use, improper body mechanics, postural dysfunction, obesity, and pain.   ACTIVITY LIMITATIONS: carrying, lifting, sleeping, bed mobility, bathing, dressing, reach over head, and hygiene/grooming  PARTICIPATION LIMITATIONS: meal prep, cleaning, driving, community activity, and yard work  PERSONAL FACTORS: Past/current experiences, Time since onset of  injury/illness/exacerbation, and 3+ comorbidities: HTN, hyperlipidemia, GERD, fibromyalgia, COPD, dyspnea, cataracts, chronic obstructive bronchitis, asthma, arthritis, history of sleep apnea are also affecting patient's functional outcome.   REHAB POTENTIAL: Good  CLINICAL DECISION MAKING: Evolving/moderate complexity  EVALUATION COMPLEXITY: Moderate  GOALS: Goals reviewed with patient? Yes  SHORT TERM GOALS: Target date: 04/24/2024  Patient will show compliance with initial HEP. Baseline: Goal status: ongoing, 03/21/2024  2.  Patient will report overall pain levels no greater than 5/10. Baseline:  Goal status: ongoing, 03/21/2024  3.  Patient will gain full PROM and AAROM of Lt shoulder to improve functional mobility. Baseline:  Goal status: ongoing, 03/21/2024  4.  Patient will increase PSFS to at least 3.8 to show a significant improvement to subjective disability rating.  Baseline:  Goal status: ongoing, 03/21/2024   LONG TERM GOALS: Target date: 06/12/2024  Patient will be independent with final HEP in order to maintain and progress upon functional gains made within PT. Baseline:  Goal status: ongoing, 03/21/2024  2.  Patient will report overall pain levels no greater than 2/10. Baseline:  Goal status: ongoing, 03/21/2024  3.   Patient will increase PSFS to at least 5.8 to show a significant improvement to subjective disability rating.  Baseline:  Goal status: ongoing, 03/21/2024  4.  Patient will return to full AROM of Lt shoulder in order to improve overall functional mobility.  Baseline:  Goal status: ongoing, 03/21/2024  5.  Patient will show strength in all motions in Lt shoulder to at least 4-/5 in order to show improved biomechanics with functional mobility.  Baseline:  Goal status: ongoing, 03/21/2024  6.  Patient will report improvement in overall sleep through the night in order to show improved overall quality of life. Baseline:  Goal status: ongoing,  03/21/2024  PLAN: PT FREQUENCY: 2-3x/wk for 6 weeks, 1-2x/wk for 6 weeks   PT DURATION: 12 weeks  PLANNED INTERVENTIONS: [id number]- PT Re-evaluation, 97750- Physical Performance Testing, 97110-Therapeutic exercises, 97530- Therapeutic activity, [id number]- Neuromuscular re-education, 97535- Self Care, [id number]- Manual therapy, [id number]- Gait training, [phone]- Canalith repositioning, H9716- Electrical stimulation (unattended), [phone]- Electrical stimulation (manual), [id number]- Vasopneumatic device, [id number]- Ultrasound, [id number]- Traction (mechanical), [id number]- Ionotophoresis 4mg /ml Dexamethasone , 79439 (1-2 muscles), 20561 (3+ muscles)- Dry Needling, Patient/Family education, Balance training, Stair training, Taping, Joint mobilization, Joint manipulation, Spinal manipulation, Spinal mobilization, Scar mobilization, Vestibular training, DME instructions, Cryotherapy, and Moist heat  PLAN FOR NEXT SESSION:   progress note for MD follow up, ROM and strength progressions as able   Left shoulder AAROM and PROM for 3 week then ok for strengthening (starting 04/02/24)  Susannah Daring, PT, DPT 04/14/24 12:53 PM

## 2024-04-14 ENCOUNTER — Ambulatory Visit (INDEPENDENT_AMBULATORY_CARE_PROVIDER_SITE_OTHER)

## 2024-04-14 DIAGNOSIS — M25612 Stiffness of left shoulder, not elsewhere classified: Secondary | ICD-10-CM | POA: Diagnosis not present

## 2024-04-14 DIAGNOSIS — G8929 Other chronic pain: Secondary | ICD-10-CM

## 2024-04-14 DIAGNOSIS — M25512 Pain in left shoulder: Secondary | ICD-10-CM

## 2024-04-14 DIAGNOSIS — R600 Localized edema: Secondary | ICD-10-CM | POA: Diagnosis not present

## 2024-04-14 DIAGNOSIS — M6281 Muscle weakness (generalized): Secondary | ICD-10-CM | POA: Diagnosis not present

## 2024-04-14 DIAGNOSIS — M62838 Other muscle spasm: Secondary | ICD-10-CM

## 2024-04-14 DIAGNOSIS — R2689 Other abnormalities of gait and mobility: Secondary | ICD-10-CM

## 2024-04-14 MED ORDER — HYDROCHLOROTHIAZIDE 25 MG PO TABS: 25.0000 mg | ORAL_TABLET | Freq: Every day | ORAL | 3 refills | Status: DC

## 2024-04-16 ENCOUNTER — Ambulatory Visit: Admitting: Physical Therapy

## 2024-04-16 ENCOUNTER — Encounter: Payer: Self-pay | Admitting: Physical Therapy

## 2024-04-16 ENCOUNTER — Ambulatory Visit (INDEPENDENT_AMBULATORY_CARE_PROVIDER_SITE_OTHER): Admitting: Orthopedic Surgery

## 2024-04-16 DIAGNOSIS — R600 Localized edema: Secondary | ICD-10-CM

## 2024-04-16 DIAGNOSIS — M25612 Stiffness of left shoulder, not elsewhere classified: Secondary | ICD-10-CM

## 2024-04-16 DIAGNOSIS — M25512 Pain in left shoulder: Secondary | ICD-10-CM | POA: Diagnosis not present

## 2024-04-16 DIAGNOSIS — G8929 Other chronic pain: Secondary | ICD-10-CM

## 2024-04-16 DIAGNOSIS — M6281 Muscle weakness (generalized): Secondary | ICD-10-CM | POA: Diagnosis not present

## 2024-04-16 DIAGNOSIS — Z9889 Other specified postprocedural states: Secondary | ICD-10-CM

## 2024-04-16 NOTE — Therapy (Signed)
 OUTPATIENT PHYSICAL THERAPY TREATMENT PROGRESS NOTE Progress Note Reporting Period 03/20/24 to 04/16/24  See note below for Objective Data and Assessment of Progress/Goals.       Patient Name: Frances Nelson MRN: [id number] DOB:April 06, 1956, 68 y.o., female Today's Date: 04/16/2024  END OF SESSION:  PT End of Session - 04/16/24 0930     Visit Number 10    Number of Visits 30    Date for Recertification  06/12/24    Authorization Type MEDIC[employer] AND [employer] [place]    Progress Note Due on Visit 20    PT Start Time 0930    PT Stop Time 1010    PT Time Calculation (min) 40 min    Activity Tolerance Patient limited by pain    Behavior During Therapy [facility] for tasks assessed/performed              Past Medical History:  Diagnosis Date   Allergy 01.01.1975   Ambulates with cane    straight cane   Arthritis    Asthma    Bronchitis, obstructive, chronic (HCC)    hx   Cataracts, bilateral    pt has not had surgery to remove as of 02/15/24   Complication of anesthesia    patient states slow to wake up after anesthesia   COPD (chronic obstructive pulmonary disease) (HCC)    Dyspnea    only with exertion   Fibromyalgia    GERD (gastroesophageal reflux disease)    Headache    HLD (hyperlipidemia)    Hypertension    Pneumonia    x several   Past Surgical History:  Procedure Laterality Date   ABDOMINAL HYSTERECTOMY  8.19.1990   APPENDECTOMY  9.1.1980   BICEPT TENODESIS Left 02/21/2024   Procedure: TENODESIS, BICEPS;  Surgeon: Addie Cordella Hamilton, MD;  Location: [facility] OR;  Service: Orthopedics;  Laterality: Left;   CYST EXCISION     upper right side lateral - benign   HERNIA REPAIR     Umbilical x 3   KNEE ARTHROSCOPY W/ MENISCAL REPAIR Left 12/2022   POSTERIOR LUMBAR FUSION 2 WITH HARDWARE REMOVAL Left 02/21/2024   Procedure: ARTHROSCOPY, SHOULDER WITH DEBRIDEMENT;  Surgeon: Addie Cordella Hamilton, MD;  Location: [facility] OR;  Service: Orthopedics;  Laterality: Left;  left  shoulder arthroscopy, debridement, biceps tenodesis, mini open rotator cuff tear repair   SHOULDER OPEN ROTATOR CUFF REPAIR Left 02/21/2024   Procedure: REPAIR, ROTATOR CUFF, OPEN;  Surgeon: Addie Cordella Hamilton, MD;  Location: [facility] OR;  Service: Orthopedics;  Laterality: Left;   SPINE SURGERY  05/2021   TONSILLECTOMY     TRANSFORAMINAL LUMBAR INTERBODY FUSION (TLIF) WITH PEDICLE SCREW FIXATION 1 LEVEL N/A 05/18/2022   Procedure: Lumbar Four-Five Open Laminectomy/Transforaminal Lumbar Interbody Fusion/Posterolateral fusion;  Surgeon: Cheryle Debby LABOR, MD;  Location: [facility] OR;  Service: Neurosurgery;  Laterality: N/A;   Patient Active Problem List   Diagnosis Date Noted   Nail disorder 04/05/2024   Synovitis of left shoulder 03/09/2024   Biceps tendonitis on left 03/09/2024   Degenerative superior labral anterior-to-posterior (SLAP) tear of left shoulder 03/09/2024   Complete tear of left rotator cuff 03/09/2024   Obesity 10/13/2023   Shingles outbreak 05/29/2023   Microhematuria 04/09/2023   Bilateral shoulder pain 04/09/2023   Urethritis 10/05/2022   External otitis of left ear 10/05/2022   Spondylolisthesis of lumbar region 05/18/2022   Smoker 04/08/2022   Deviated nasal septum 04/08/2022   Bilateral pain of leg and foot 04/08/2022   Follicular acne 04/06/2022  Baker cyst, left 04/06/2022   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   Depression 10/04/2021   Left knee pain 10/04/2021   Chronic pain 04/09/2021   Pelvic pain 04/09/2021   HLD (hyperlipidemia) 04/09/2021   Vitamin D  deficiency 04/09/2021   Estrogen deficiency 04/09/2021   Hyperglycemia 04/01/2021   Chronic cough 12/30/2020   GERD (gastroesophageal reflux disease) 11/06/2019   Bilateral leg edema 10/16/2019   Pleuritic chest pain 10/16/2019   Hypertension 10/02/2019   Arthralgia 06/28/2018   Sinusitis 06/28/2018   Renal cyst 05/03/2018   Syncope 05/03/2018   Stress and adjustment  reaction 05/03/2018   History of tobacco use 05/02/2018   Primary osteoarthritis of both hands 04/10/2018   Primary osteoarthritis of both knees 04/10/2018   Primary osteoarthritis of both feet 04/10/2018   Achilles tendinitis 04/02/2018   Screening for breast cancer 03/15/2018   Positive ANA (antinuclear antibody) 03/15/2018   Screening for colon cancer 03/15/2018   Rash 03/15/2018   Achilles tendon pain 11/13/2017   Cervical myelopathy with cervical radiculopathy (HCC) 11/13/2017   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   Allergic rhinitis 06/19/2017   Edema 12/24/2015   Carpal tunnel syndrome, right 12/16/2015   Hypercalcemia 11/11/2015   Pain in both upper extremities 11/11/2015   Drug reaction 10/28/2015   Muscle spasms of both lower extremities 10/28/2015   Nerve pain 10/28/2015   Chronic asthmatic bronchitis with acute exacerbation (HCC) 09/06/2015   Environmental and seasonal allergies 07/17/2014    PCP: Lynwood LELON Rush, MD   REFERRING PROVIDER: Cordella Glendia Hutchinson, MD   REFERRING DIAG: [phone] (ICD-10-CM) - S/P rotator cuff repair  THERAPY DIAG:  Chronic left shoulder pain  Stiffness of left shoulder, not elsewhere classified  Muscle weakness (generalized)  Localized edema  Rationale for Evaluation and Treatment: Rehabilitation  ONSET DATE: 02/21/2024 left shoulder arthroscopy with debridement biceps tenodesis rotator cuff tear repair with a 2 x 3 construct  SUBJECTIVE:                                                                                                                                                                                      SUBJECTIVE STATEMENT: Feels pretty good overall, concerned she's falling behind Reports she's having a lot of pain - trying to push through it   Hand dominance: Right  PERTINENT HISTORY: Patient endorsed having gone through PT and multiple shoulder injection prior to appointment with Dr. Hutchinson, who scheduled and performed  her surgery. Patient notes that her original pain started in Feb 2025, though did have a fall down stairs Feb 2024 and is not sure if that is when she injured shoulder  as she did not have pain at that time. Patient used CPM daily and wears sling only in public at this time, but is working on weaning from both. Patient endorses difficulty with sleeping and can average 5 hours per night when not in so much pain. Patient has undergone knee surgery (2023), back surgery (2024), and also currently has bilat carpal tunnel with n/t in fingers and hands.  PAIN:   NPRS scale: 7/10 Pain location: Lt shoulder/arm Pain description: all pain descriptors, but most of the time achey Aggravating factors: moving a certain way, reaching, trying to do hair, picking something up   Relieving factors: tylenol  arthritis twice a day, tramadol  as needed (mid morning, before bed), muscle relaxer, arnica herb (gel for bruising and pain), lidocaine  patches, ice    PRECAUTIONS: Shoulder  RED FLAGS: None   WEIGHT BEARING RESTRICTIONS: Yes Lt shoulder   FALLS:  Has patient fallen in last 6 months? Yes. Number of falls 1, slipped in the bath tub   LIVING ENVIRONMENT: Lives with: lives with their family and lives with their son Lives in: House/apartment Stairs: Yes: Internal: 15 steps; on left going up and External: 3 steps; none Has following equipment at home: Single point cane, Quad cane small base, Walker - 2 wheeled, shower chair, Shower bench, and Grab bars  OCCUPATION: Retired; Print production planner   PLOF: Independent and Independent with community mobility with device  PATIENT GOALS: to be able to use my arm again   OBJECTIVE:  Note: Objective measures were completed at Evaluation unless otherwise noted.  DIAGNOSTIC FINDINGS:    PATIENT SURVEYS :  PSFS: THE PATIENT SPECIFIC FUNCTIONAL SCALE  Place score of 0-10 (0 = unable to perform activity and 10 = able to perform activity at the same level as before  injury or problem)  Activity Date: 03/20/2024  04/16/24   Get dressed and putting shoes on  4 4   2. Brush and style your hair / showering  1 5   3. Cook  2 6   4. Washing dishes  1  4   5. Clean, sweep, mop  1 2   Total Score 1.8 4.2     Total Score = Sum of activity scores/number of activities  Minimally Detectable Change: 3 points (for single activity); 2 points (for average score)  [facility] (nd). The Patient Specific Functional Scale . Retrieved from [email or web address]   COGNITION: Overall cognitive status: Within functional limits for tasks assessed     SENSATION: Light touch: diminished in fingers secondary to carpal tunnel Carpal tunnel leading to n/t in fingers;   POSTURE: Increased thoracic kyphosis, forward head, and forward rounded shoulders   UPPER EXTREMITY ROM:    ROM Right Eval 03/20/2024 Left Eval 03/20/2024 Left 04/04/24 Left 04/16/24  Shoulder flexion WFL 85 AAROM, highly painful A: 15  (supine) A: 112  (sitting) 140 (supine) P: 147  Shoulder extension      Shoulder abduction WFL 60 AAROM, highly painful  A: 85  (sitting) 101 (supine) P: 109  Shoulder adduction      Shoulder internal rotation T8 75 AAROM at 45deg abd in supine  P: 85 in 60 deg abdct  Shoulder external rotation T1 23 AAROM at 45deg abd in supine  P: 60 in 60 deg abdct  (Blank rows = not tested)  UPPER EXTREMITY MMT: unable to perform on eval due to protocol restrictions  MMT Right Eval 03/20/2024 Left Eval 03/20/2024  Shoulder flexion  Shoulder extension    Shoulder abduction    Shoulder adduction    Shoulder internal rotation    Shoulder external rotation    Middle trapezius    Lower trapezius    Elbow flexion    Elbow extension    Wrist flexion    Wrist extension    Wrist ulnar deviation    Wrist radial deviation    Wrist pronation    Wrist supination    Grip strength (lbs)    (Blank rows = not  tested)  SHOULDER SPECIAL TESTS: Did not perform on eval   JOINT MOBILITY TESTING:  Not assessed on eval   PALPATION:  Not assessed one val    TREATMENT DATE:  04/16/24 Pulleys flexion and scaption x 3 min each ROM measurements - see above for details Chest press 2x10; 2# bar Supine AA flexion 2# bar 2x10 Shoulder circles x20 CW/CCW in 90 deg flexion Seated active ER 2x10 Standing AA flexion with 2# bar 2x10   04/14/2024 TherEx:  Pulleys for ROM: shoulder flexion for 3 minutes, shoulder scaption for 3 minutes Supine PT assisted AAROM abduction 1x6 with 10 hold in max stretch each rep, able to reach 99deg on last rep Supine chest press with 2# bar 1x10  Supine shoulder flexion 2x5 with 2# bar PT discussed protocol and what to expect coming up   Vaso:   Medium compression to Lt shoulder for 10 minutes at 34deg   04/11/2024 TherEx:  Pulleys for ROM: shoulder flexion for 3 minutes, shoulder scaption for 3 minutes Sidelying ER AROM 2x14  Sidelying abduction AROM 1x8 with pain/discomfort with eccentric motion  Supine shoulder flexion AROM 1x2 ; discontinued secondary to pain and discomfort   TherAct:  Seated circles with UE ranger 2x1 minute CW and CCW  AAROM shoulder scaption to first and second shelf in cabinet with AROM eccentric motion 1x3 , 1x5   Vaso:  Medium compression to Lt shoulder for 10 minutes at 34deg    04/09/2024 TherEx:  Pulleys for ROM: shoulder flexion for 3 minutes, shoulder scaption for 3 minutes  Seated circles with UE ranger 2x1 minute CW and CCW  ER walkouts with yellow TB 2x5 with 2-3s hold with intermittent tactile cues for appropriate form PT discussed HEP and how to attain appropriate contraction for isometric strengthening without causing pain  TherAct: AAROM shoulder flexion to first and second shelves in cabinet with AROM eccentric movement 2x5 Patient able to increase challenge by completing AROM concentric with intermittent Rt UE use    Vaso: Medium compression to Lt shoulder for 10 minutes at 34deg    04/07/2024 TherEx:  Pulleys for ROM: shoulder flexion for 3 minutes, shoulder scaption for 3 minutes  Supine chest press with 2# bar 1x8  Supine AAROM shoulder flexion with 2# bar 1x5 PT discussed HEP with patient   Manual:  PROM abduction, attempted inferior glide but patient notes discomfort so discontinued glide, then ER/IR with posterior glide   Vaso:  Medium compression to Lt shoulder for 10 minutes at 34deg      PATIENT EDUCATION: Education details: HEP, POC, typical recovery from rotator cuff repair Person educated: Patient and Child(ren) Education method: Explanation, Demonstration, Verbal cues, and Handouts Education comprehension: verbalized understanding  HOME EXERCISE PROGRAM: Access Code: [email or web address] URL: [email or web address] Date: 03/20/2024 Prepared by: Susannah Daring  Exercises - Circular Shoulder Pendulum with Table Support  - 1 x daily - 7 x weekly - 3 sets - 10 reps - Flexion-Extension Shoulder Pendulum with  Table Support  - 1 x daily - 7 x weekly - 3 sets - 10 reps - Horizontal Shoulder Pendulum with Table Support  - 1 x daily - 7 x weekly - 3 sets - 10 reps - Seated Scapular Retraction  - 1 x daily - 7 x weekly - 3 sets - 10 reps - Seated AAROM Shoulder Elevation/Depression  - 1 x daily - 7 x weekly - 3 sets - 10 reps  ASSESSMENT:  CLINICAL IMPRESSION:  Pt with good improvement in AROM today and overall progressing well with PT.  Will plan to begin light strengthening next session.   OBJECTIVE IMPAIRMENTS: decreased activity tolerance, decreased coordination, decreased endurance, decreased mobility, decreased ROM, decreased strength, decreased safety awareness, hypomobility, increased edema, impaired flexibility, impaired sensation, impaired UE functional use, improper body mechanics, postural dysfunction, obesity, and pain.   ACTIVITY LIMITATIONS: carrying, lifting,  sleeping, bed mobility, bathing, dressing, reach over head, and hygiene/grooming  PARTICIPATION LIMITATIONS: meal prep, cleaning, driving, community activity, and yard work  PERSONAL FACTORS: Past/current experiences, Time since onset of injury/illness/exacerbation, and 3+ comorbidities: HTN, hyperlipidemia, GERD, fibromyalgia, COPD, dyspnea, cataracts, chronic obstructive bronchitis, asthma, arthritis, history of sleep apnea are also affecting patient's functional outcome.   REHAB POTENTIAL: Good  CLINICAL DECISION MAKING: Evolving/moderate complexity  EVALUATION COMPLEXITY: Moderate  GOALS: Goals reviewed with patient? Yes  SHORT TERM GOALS: Target date: 04/24/2024  Patient will show compliance with initial HEP. Baseline: Goal status: MET 04/16/24  2.  Patient will report overall pain levels no greater than 5/10. Baseline:  Goal status: ongoing 04/16/24  3.  Patient will gain full PROM and AAROM of Lt shoulder to improve functional mobility. Baseline:  Goal status: ongoing 04/16/24  4.  Patient will increase PSFS to at least 3.8 to show a significant improvement to subjective disability rating.  Baseline:  Goal status: MET 04/16/24   LONG TERM GOALS: Target date: 06/12/2024  Patient will be independent with final HEP in order to maintain and progress upon functional gains made within PT. Baseline:  Goal status: ongoing 04/16/24  2.  Patient will report overall pain levels no greater than 2/10. Baseline:  Goal status: ongoing 04/16/24  3.   Patient will increase PSFS to at least 5.8 to show a significant improvement to subjective disability rating.  Baseline:  Goal status: ongoing 04/16/24  4.  Patient will return to full AROM of Lt shoulder in order to improve overall functional mobility.  Baseline:  Goal status: ongoing 04/16/24  5.  Patient will show strength in all motions in Lt shoulder to at least 4-/5 in order to show improved biomechanics with functional mobility.   Baseline:  Goal status: ongoing 04/16/24  6.  Patient will report improvement in overall sleep through the night in order to show improved overall quality of life. Baseline:  Goal status: ongoing 04/16/24  PLAN: PT FREQUENCY: 2-3x/wk for 6 weeks, 1-2x/wk for 6 weeks   PT DURATION: 12 weeks  PLANNED INTERVENTIONS: 97164- PT Re-evaluation, 97750- Physical Performance Testing, 97110-Therapeutic exercises, 97530- Therapeutic activity, [id number]- Neuromuscular re-education, 97535- Self Care, [id number]- Manual therapy, [id number]- Gait training, [phone]- Canalith repositioning, H9716- Electrical stimulation (unattended), [phone]- Electrical stimulation (manual), [id number]- Vasopneumatic device, [id number]- Ultrasound, [id number]- Traction (mechanical), [id number]- Ionotophoresis 4mg /ml Dexamethasone , 79439 (1-2 muscles), 20561 (3+ muscles)- Dry Needling, Patient/Family education, Balance training, Stair training, Taping, Joint mobilization, Joint manipulation, Spinal manipulation, Spinal mobilization, Scar mobilization, Vestibular training, DME instructions, Cryotherapy, and Moist heat  PLAN FOR NEXT SESSION:   start light band  exercises, ROM and strength progressions as able   Left shoulder AAROM and PROM for 3 week then ok for strengthening (starting 04/02/24)   NEXT MD VISIT: April 16, 2024 with Dr. Addie Corean JULIANNA Alvia, PT, DPT 04/16/24 10:15 AM

## 2024-04-17 ENCOUNTER — Encounter: Payer: Self-pay | Admitting: Orthopedic Surgery

## 2024-04-17 NOTE — Progress Notes (Signed)
 Frances Nelson you I just reached  Post-Op Visit Note   Patient: Frances Nelson           Date of Birth: [birth year]           MRN: [id number] Visit Date: 04/16/2024 PCP: Norleen Lynwood ORN, MD   Assessment & Plan:  Chief Complaint:  Chief Complaint  Patient presents with   Left Shoulder - Routine Post Op    02/21/24 left shoulder arthroscopy with debridement biceps tenodesis rotator cuff tear repair with a 2 x 3 construct  9PT visits since last OV   Visit Diagnoses:  1. S/P rotator cuff repair     Plan: [name] underwent left shoulder arthroscopy with debridement biceps tenodesis and rotator cuff tear repair with 2 x 3 construct on 02/21/2024.  She has been ambulating with a cane.  Taking tramadol  and Flexeril .  Therapy note is reviewed.  She has active flexion of 112 sitting and 140 supine.  Internal rotation 85 supine.  External rotation 60 supine.  No crepitus with passive range of motion and rotator cuff strength is improving.  Plan at this time is 6-week return for final clinical recheck.  Overall she is making very good progress with her rotator cuff tear repair.  I do want her to be careful with any lifting away from her body.  Follow-Up Instructions: Return in about 6 weeks (around 05/28/2024).   Orders:  No orders of the defined types were placed in this encounter.  No orders of the defined types were placed in this encounter.   Imaging: No results found.  PMFS History: Patient Active Problem List   Diagnosis Date Noted   Nail disorder 04/05/2024   Synovitis of left shoulder 03/09/2024   Biceps tendonitis on left 03/09/2024   Degenerative superior labral anterior-to-posterior (SLAP) tear of left shoulder 03/09/2024   Complete tear of left rotator cuff 03/09/2024   Obesity 10/13/2023   Shingles outbreak 05/29/2023   Microhematuria 04/09/2023   Bilateral shoulder pain 04/09/2023   Urethritis 10/05/2022   External otitis of left ear 10/05/2022   Spondylolisthesis of lumbar  region 05/18/2022   Smoker 04/08/2022   Deviated nasal septum 04/08/2022   Bilateral pain of leg and foot 04/08/2022   Follicular acne 04/06/2022   Baker cyst, left 04/06/2022   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   Depression 10/04/2021   Left knee pain 10/04/2021   Chronic pain 04/09/2021   Pelvic pain 04/09/2021   HLD (hyperlipidemia) 04/09/2021   Vitamin D  deficiency 04/09/2021   Estrogen deficiency 04/09/2021   Hyperglycemia 04/01/2021   Chronic cough 12/30/2020   GERD (gastroesophageal reflux disease) 11/06/2019   Bilateral leg edema 10/16/2019   Pleuritic chest pain 10/16/2019   Hypertension 10/02/2019   Arthralgia 06/28/2018   Sinusitis 06/28/2018   Renal cyst 05/03/2018   Syncope 05/03/2018   Stress and adjustment reaction 05/03/2018   History of tobacco use 05/02/2018   Primary osteoarthritis of both hands 04/10/2018   Primary osteoarthritis of both knees 04/10/2018   Primary osteoarthritis of both feet 04/10/2018   Achilles tendinitis 04/02/2018   Screening for breast cancer 03/15/2018   Positive ANA (antinuclear antibody) 03/15/2018   Screening for colon cancer 03/15/2018   Rash 03/15/2018   Achilles tendon pain 11/13/2017   Cervical myelopathy with cervical radiculopathy (HCC) 11/13/2017   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   Allergic rhinitis 06/19/2017   Edema 12/24/2015   Carpal tunnel syndrome, right 12/16/2015   Hypercalcemia 11/11/2015  Pain in both upper extremities 11/11/2015   Drug reaction 10/28/2015   Muscle spasms of both lower extremities 10/28/2015   Nerve pain 10/28/2015   Chronic asthmatic bronchitis with acute exacerbation (HCC) 09/06/2015   Environmental and seasonal allergies 07/17/2014   Past Medical History:  Diagnosis Date   Allergy 01.01.1975   Ambulates with cane    straight cane   Arthritis    Asthma    Bronchitis, obstructive, chronic (HCC)    hx   Cataracts, bilateral    pt has not  had surgery to remove as of 02/15/24   Complication of anesthesia    patient states slow to wake up after anesthesia   COPD (chronic obstructive pulmonary disease) (HCC)    Dyspnea    only with exertion   Fibromyalgia    GERD (gastroesophageal reflux disease)    Headache    HLD (hyperlipidemia)    Hypertension    Pneumonia    x several    Family History  Problem Relation Age of Onset   Arthritis Mother    COPD Mother    Depression Mother    Hearing loss Mother    Angina Father    Parkinson's disease Father    Hearing loss Father    Heart disease Father    Leukemia Sister    Kidney disease Sister    Kidney disease Brother    Brain cancer Brother        glioblastoma    Cancer Brother    Allergies Son    Asthma Son    Allergies Son    Drug abuse Son    Early death Son    Asthma Son    Cancer Sister    Cancer Brother    Asthma Son    Cancer Brother    Cancer Sister     Past Surgical History:  Procedure Laterality Date   ABDOMINAL HYSTERECTOMY  8.19.1990   APPENDECTOMY  9.1.1980   BICEPT TENODESIS Left 02/21/2024   Procedure: TENODESIS, BICEPS;  Surgeon: Addie Cordella Hamilton, MD;  Location: [facility] OR;  Service: Orthopedics;  Laterality: Left;   CYST EXCISION     upper right side lateral - benign   HERNIA REPAIR     Umbilical x 3   KNEE ARTHROSCOPY W/ MENISCAL REPAIR Left 12/2022   POSTERIOR LUMBAR FUSION 2 WITH HARDWARE REMOVAL Left 02/21/2024   Procedure: ARTHROSCOPY, SHOULDER WITH DEBRIDEMENT;  Surgeon: Addie Cordella Hamilton, MD;  Location: [facility] OR;  Service: Orthopedics;  Laterality: Left;  left shoulder arthroscopy, debridement, biceps tenodesis, mini open rotator cuff tear repair   SHOULDER OPEN ROTATOR CUFF REPAIR Left 02/21/2024   Procedure: REPAIR, ROTATOR CUFF, OPEN;  Surgeon: Addie Cordella Hamilton, MD;  Location: [facility] OR;  Service: Orthopedics;  Laterality: Left;   SPINE SURGERY  05/2021   TONSILLECTOMY     TRANSFORAMINAL LUMBAR INTERBODY FUSION (TLIF) WITH PEDICLE  SCREW FIXATION 1 LEVEL N/A 05/18/2022   Procedure: Lumbar Four-Five Open Laminectomy/Transforaminal Lumbar Interbody Fusion/Posterolateral fusion;  Surgeon: Cheryle Debby LABOR, MD;  Location: [facility] OR;  Service: Neurosurgery;  Laterality: N/A;   Social History   Occupational History   Occupation: RETIRED  Tobacco Use   Smoking status: Every Day    Current packs/day: 1.00    Average packs/day: 1 pack/day for 40.8 years (40.8 ttl pk-yrs)    Types: Cigarettes    Start date: 89    Last attempt to quit: 2000    Passive exposure: Never   Smokeless tobacco: Never  Tobacco comments:    1 pack a day    01/23/24-Quit several times totaling 10 yrs but still currently smoking 1 ppd  Vaping Use   Vaping status: Never Used  Substance and Sexual Activity   Alcohol use: No   Drug use: Yes    Types: Amphetamines    Comment: on phentermine  for weight loss   Sexual activity: Not Currently    Birth control/protection: Surgical    Comment: Hysterectomy

## 2024-04-17 NOTE — Therapy (Signed)
 OUTPATIENT PHYSICAL THERAPY TREATMENT PROGRESS NOTE Progress Note Reporting Period 03/20/24 to 04/16/24  See note below for Objective Data and Assessment of Progress/Goals.       Patient Name: Frances Nelson MRN: [id number] DOB:29-Jul-1955, 68 y.o., female Today's Date: 04/17/2024  END OF SESSION:        Past Medical History:  Diagnosis Date   Allergy 01.01.1975   Ambulates with cane    straight cane   Arthritis    Asthma    Bronchitis, obstructive, chronic (HCC)    hx   Cataracts, bilateral    pt has not had surgery to remove as of 02/15/24   Complication of anesthesia    patient states slow to wake up after anesthesia   COPD (chronic obstructive pulmonary disease) (HCC)    Dyspnea    only with exertion   Fibromyalgia    GERD (gastroesophageal reflux disease)    Headache    HLD (hyperlipidemia)    Hypertension    Pneumonia    x several   Past Surgical History:  Procedure Laterality Date   ABDOMINAL HYSTERECTOMY  8.19.1990   APPENDECTOMY  9.1.1980   BICEPT TENODESIS Left 02/21/2024   Procedure: TENODESIS, BICEPS;  Surgeon: Addie Cordella Hamilton, MD;  Location: [facility] OR;  Service: Orthopedics;  Laterality: Left;   CYST EXCISION     upper right side lateral - benign   HERNIA REPAIR     Umbilical x 3   KNEE ARTHROSCOPY W/ MENISCAL REPAIR Left 12/2022   POSTERIOR LUMBAR FUSION 2 WITH HARDWARE REMOVAL Left 02/21/2024   Procedure: ARTHROSCOPY, SHOULDER WITH DEBRIDEMENT;  Surgeon: Addie Cordella Hamilton, MD;  Location: [facility] OR;  Service: Orthopedics;  Laterality: Left;  left shoulder arthroscopy, debridement, biceps tenodesis, mini open rotator cuff tear repair   SHOULDER OPEN ROTATOR CUFF REPAIR Left 02/21/2024   Procedure: REPAIR, ROTATOR CUFF, OPEN;  Surgeon: Addie Cordella Hamilton, MD;  Location: [facility] OR;  Service: Orthopedics;  Laterality: Left;   SPINE SURGERY  05/2021   TONSILLECTOMY     TRANSFORAMINAL LUMBAR INTERBODY FUSION (TLIF) WITH PEDICLE SCREW FIXATION 1 LEVEL  N/A 05/18/2022   Procedure: Lumbar Four-Five Open Laminectomy/Transforaminal Lumbar Interbody Fusion/Posterolateral fusion;  Surgeon: Cheryle Debby LABOR, MD;  Location: [facility] OR;  Service: Neurosurgery;  Laterality: N/A;   Patient Active Problem List   Diagnosis Date Noted   Nail disorder 04/05/2024   Synovitis of left shoulder 03/09/2024   Biceps tendonitis on left 03/09/2024   Degenerative superior labral anterior-to-posterior (SLAP) tear of left shoulder 03/09/2024   Complete tear of left rotator cuff 03/09/2024   Obesity 10/13/2023   Shingles outbreak 05/29/2023   Microhematuria 04/09/2023   Bilateral shoulder pain 04/09/2023   Urethritis 10/05/2022   External otitis of left ear 10/05/2022   Spondylolisthesis of lumbar region 05/18/2022   Smoker 04/08/2022   Deviated nasal septum 04/08/2022   Bilateral pain of leg and foot 04/08/2022   Follicular acne 04/06/2022   Baker cyst, left 04/06/2022   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   Depression 10/04/2021   Left knee pain 10/04/2021   Chronic pain 04/09/2021   Pelvic pain 04/09/2021   HLD (hyperlipidemia) 04/09/2021   Vitamin D  deficiency 04/09/2021   Estrogen deficiency 04/09/2021   Hyperglycemia 04/01/2021   Chronic cough 12/30/2020   GERD (gastroesophageal reflux disease) 11/06/2019   Bilateral leg edema 10/16/2019   Pleuritic chest pain 10/16/2019   Hypertension 10/02/2019   Arthralgia 06/28/2018   Sinusitis 06/28/2018   Renal cyst  05/03/2018   Syncope 05/03/2018   Stress and adjustment reaction 05/03/2018   History of tobacco use 05/02/2018   Primary osteoarthritis of both hands 04/10/2018   Primary osteoarthritis of both knees 04/10/2018   Primary osteoarthritis of both feet 04/10/2018   Achilles tendinitis 04/02/2018   Screening for breast cancer 03/15/2018   Positive ANA (antinuclear antibody) 03/15/2018   Screening for colon cancer 03/15/2018   Rash 03/15/2018   Achilles  tendon pain 11/13/2017   Cervical myelopathy with cervical radiculopathy (HCC) 11/13/2017   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   Allergic rhinitis 06/19/2017   Edema 12/24/2015   Carpal tunnel syndrome, right 12/16/2015   Hypercalcemia 11/11/2015   Pain in both upper extremities 11/11/2015   Drug reaction 10/28/2015   Muscle spasms of both lower extremities 10/28/2015   Nerve pain 10/28/2015   Chronic asthmatic bronchitis with acute exacerbation (HCC) 09/06/2015   Environmental and seasonal allergies 07/17/2014    PCP: Lynwood LELON Rush, MD   REFERRING PROVIDER: Cordella Glendia Hutchinson, MD   REFERRING DIAG: [phone] (ICD-10-CM) - S/P rotator cuff repair  THERAPY DIAG:  No diagnosis found.  Rationale for Evaluation and Treatment: Rehabilitation  ONSET DATE: 02/21/2024 left shoulder arthroscopy with debridement biceps tenodesis rotator cuff tear repair with a 2 x 3 construct  SUBJECTIVE:                                                                                                                                                                                      SUBJECTIVE STATEMENT: *** Feels pretty good overall, concerned she's falling behind Reports she's having a lot of pain - trying to push through it   Hand dominance: Right  PERTINENT HISTORY: Patient endorsed having gone through PT and multiple shoulder injection prior to appointment with Dr. Hutchinson, who scheduled and performed her surgery. Patient notes that her original pain started in Feb 2025, though did have a fall down stairs Feb 2024 and is not sure if that is when she injured shoulder as she did not have pain at that time. Patient used CPM daily and wears sling only in public at this time, but is working on weaning from both. Patient endorses difficulty with sleeping and can average 5 hours per night when not in so much pain. Patient has undergone knee surgery (2023), back surgery (2024), and also currently has bilat carpal  tunnel with n/t in fingers and hands.  PAIN:   NPRS scale: 7/10 Pain location: Lt shoulder/arm Pain description: all pain descriptors, but most of the time achey Aggravating factors: moving a certain way, reaching, trying to do hair, picking something up   Relieving factors: tylenol   arthritis twice a day, tramadol  as needed (mid morning, before bed), muscle relaxer, arnica herb (gel for bruising and pain), lidocaine  patches, ice    PRECAUTIONS: Shoulder  RED FLAGS: None   WEIGHT BEARING RESTRICTIONS: Yes Lt shoulder   FALLS:  Has patient fallen in last 6 months? Yes. Number of falls 1, slipped in the bath tub   LIVING ENVIRONMENT: Lives with: lives with their family and lives with their son Lives in: House/apartment Stairs: Yes: Internal: 15 steps; on left going up and External: 3 steps; none Has following equipment at home: Single point cane, Quad cane small base, Walker - 2 wheeled, shower chair, Shower bench, and Grab bars  OCCUPATION: Retired; Print production planner   PLOF: Independent and Independent with community mobility with device  PATIENT GOALS: to be able to use my arm again   OBJECTIVE:  Note: Objective measures were completed at Evaluation unless otherwise noted.  DIAGNOSTIC FINDINGS:    PATIENT SURVEYS :  PSFS: THE PATIENT SPECIFIC FUNCTIONAL SCALE  Place score of 0-10 (0 = unable to perform activity and 10 = able to perform activity at the same level as before injury or problem)  Activity Date: 03/20/2024  04/16/24   Get dressed and putting shoes on  4 4   2. Brush and style your hair / showering  1 5   3. Cook  2 6   4. Washing dishes  1  4   5. Clean, sweep, mop  1 2   Total Score 1.8 4.2     Total Score = Sum of activity scores/number of activities  Minimally Detectable Change: 3 points (for single activity); 2 points (for average score)  [facility] (nd). The Patient Specific Functional Scale . Retrieved from  [email or web address]   COGNITION: Overall cognitive status: Within functional limits for tasks assessed     SENSATION: Light touch: diminished in fingers secondary to carpal tunnel Carpal tunnel leading to n/t in fingers;   POSTURE: Increased thoracic kyphosis, forward head, and forward rounded shoulders   UPPER EXTREMITY ROM:    ROM Right Eval 03/20/2024 Left Eval 03/20/2024 Left 04/04/24 Left 04/16/24  Shoulder flexion WFL 85 AAROM, highly painful A: 15  (supine) A: 112  (sitting) 140 (supine) P: 147  Shoulder extension      Shoulder abduction WFL 60 AAROM, highly painful  A: 85  (sitting) 101 (supine) P: 109  Shoulder adduction      Shoulder internal rotation T8 75 AAROM at 45deg abd in supine  P: 85 in 60 deg abdct  Shoulder external rotation T1 23 AAROM at 45deg abd in supine  P: 60 in 60 deg abdct  (Blank rows = not tested)  UPPER EXTREMITY MMT: unable to perform on eval due to protocol restrictions  MMT Right Eval 03/20/2024 Left Eval 03/20/2024  Shoulder flexion    Shoulder extension    Shoulder abduction    Shoulder adduction    Shoulder internal rotation    Shoulder external rotation    Middle trapezius    Lower trapezius    Elbow flexion    Elbow extension    Wrist flexion    Wrist extension    Wrist ulnar deviation    Wrist radial deviation    Wrist pronation    Wrist supination    Grip strength (lbs)    (Blank rows = not tested)  SHOULDER SPECIAL TESTS: Did not perform on eval   JOINT MOBILITY TESTING:  Not assessed on eval  PALPATION:  Not assessed one val    TREATMENT DATE:  04/18/2024 ***   04/16/24 Pulleys flexion and scaption x 3 min each ROM measurements - see above for details Chest press 2x10; 2# bar Supine AA flexion 2# bar 2x10 Shoulder circles x20 CW/CCW in 90 deg flexion Seated active ER 2x10 Standing AA flexion with 2# bar 2x10   04/14/2024 TherEx:  Pulleys for ROM:  shoulder flexion for 3 minutes, shoulder scaption for 3 minutes Supine PT assisted AAROM abduction 1x6 with 10 hold in max stretch each rep, able to reach 99deg on last rep Supine chest press with 2# bar 1x10  Supine shoulder flexion 2x5 with 2# bar PT discussed protocol and what to expect coming up   Vaso:   Medium compression to Lt shoulder for 10 minutes at 34deg   04/11/2024 TherEx:  Pulleys for ROM: shoulder flexion for 3 minutes, shoulder scaption for 3 minutes Sidelying ER AROM 2x14  Sidelying abduction AROM 1x8 with pain/discomfort with eccentric motion  Supine shoulder flexion AROM 1x2 ; discontinued secondary to pain and discomfort   TherAct:  Seated circles with UE ranger 2x1 minute CW and CCW  AAROM shoulder scaption to first and second shelf in cabinet with AROM eccentric motion 1x3 , 1x5   Vaso:  Medium compression to Lt shoulder for 10 minutes at 34deg    04/09/2024 TherEx:  Pulleys for ROM: shoulder flexion for 3 minutes, shoulder scaption for 3 minutes  Seated circles with UE ranger 2x1 minute CW and CCW  ER walkouts with yellow TB 2x5 with 2-3s hold with intermittent tactile cues for appropriate form PT discussed HEP and how to attain appropriate contraction for isometric strengthening without causing pain  TherAct: AAROM shoulder flexion to first and second shelves in cabinet with AROM eccentric movement 2x5 Patient able to increase challenge by completing AROM concentric with intermittent Rt UE use   Vaso: Medium compression to Lt shoulder for 10 minutes at 34deg        PATIENT EDUCATION: Education details: HEP, POC, typical recovery from rotator cuff repair Person educated: Patient and Child(ren) Education method: Explanation, Demonstration, Verbal cues, and Handouts Education comprehension: verbalized understanding  HOME EXERCISE PROGRAM: Access Code: [email or web address] URL: [email or web address] Date: 03/20/2024 Prepared by: Susannah Daring  Exercises - Circular Shoulder Pendulum with Table Support  - 1 x daily - 7 x weekly - 3 sets - 10 reps - Flexion-Extension Shoulder Pendulum with Table Support  - 1 x daily - 7 x weekly - 3 sets - 10 reps - Horizontal Shoulder Pendulum with Table Support  - 1 x daily - 7 x weekly - 3 sets - 10 reps - Seated Scapular Retraction  - 1 x daily - 7 x weekly - 3 sets - 10 reps - Seated AAROM Shoulder Elevation/Depression  - 1 x daily - 7 x weekly - 3 sets - 10 reps  ASSESSMENT:  CLINICAL IMPRESSION:  *** Pt with good improvement in AROM today and overall progressing well with PT.  Will plan to begin light strengthening next session.   OBJECTIVE IMPAIRMENTS: decreased activity tolerance, decreased coordination, decreased endurance, decreased mobility, decreased ROM, decreased strength, decreased safety awareness, hypomobility, increased edema, impaired flexibility, impaired sensation, impaired UE functional use, improper body mechanics, postural dysfunction, obesity, and pain.   ACTIVITY LIMITATIONS: carrying, lifting, sleeping, bed mobility, bathing, dressing, reach over head, and hygiene/grooming  PARTICIPATION LIMITATIONS: meal prep, cleaning, driving, community activity, and yard work  PERSONAL FACTORS: Past/current experiences, Time since  onset of injury/illness/exacerbation, and 3+ comorbidities: HTN, hyperlipidemia, GERD, fibromyalgia, COPD, dyspnea, cataracts, chronic obstructive bronchitis, asthma, arthritis, history of sleep apnea are also affecting patient's functional outcome.   REHAB POTENTIAL: Good  CLINICAL DECISION MAKING: Evolving/moderate complexity  EVALUATION COMPLEXITY: Moderate  GOALS: Goals reviewed with patient? Yes  SHORT TERM GOALS: Target date: 04/24/2024  Patient will show compliance with initial HEP. Baseline: Goal status: MET 04/16/24  2.  Patient will report overall pain levels no greater than 5/10. Baseline:  Goal status: ongoing 04/16/24  3.   Patient will gain full PROM and AAROM of Lt shoulder to improve functional mobility. Baseline:  Goal status: ongoing 04/16/24  4.  Patient will increase PSFS to at least 3.8 to show a significant improvement to subjective disability rating.  Baseline:  Goal status: MET 04/16/24   LONG TERM GOALS: Target date: 06/12/2024  Patient will be independent with final HEP in order to maintain and progress upon functional gains made within PT. Baseline:  Goal status: ongoing 04/16/24  2.  Patient will report overall pain levels no greater than 2/10. Baseline:  Goal status: ongoing 04/16/24  3.   Patient will increase PSFS to at least 5.8 to show a significant improvement to subjective disability rating.  Baseline:  Goal status: ongoing 04/16/24  4.  Patient will return to full AROM of Lt shoulder in order to improve overall functional mobility.  Baseline:  Goal status: ongoing 04/16/24  5.  Patient will show strength in all motions in Lt shoulder to at least 4-/5 in order to show improved biomechanics with functional mobility.  Baseline:  Goal status: ongoing 04/16/24  6.  Patient will report improvement in overall sleep through the night in order to show improved overall quality of life. Baseline:  Goal status: ongoing 04/16/24  PLAN: PT FREQUENCY: 2-3x/wk for 6 weeks, 1-2x/wk for 6 weeks   PT DURATION: 12 weeks  PLANNED INTERVENTIONS: [id number]- PT Re-evaluation, 97750- Physical Performance Testing, 97110-Therapeutic exercises, 97530- Therapeutic activity, [id number]- Neuromuscular re-education, 97535- Self Care, [id number]- Manual therapy, [id number]- Gait training, [phone]- Canalith repositioning, [id number]- Electrical stimulation (unattended), [phone]- Electrical stimulation (manual), [id number]- Vasopneumatic device, [id number]- Ultrasound, [id number]- Traction (mechanical), [id number]- Ionotophoresis 4mg /ml Dexamethasone , [id number] (1-2 muscles), [id number] (3+ muscles)- Dry Needling, Patient/Family education, Balance training, Stair  training, Taping, Joint mobilization, Joint manipulation, Spinal manipulation, Spinal mobilization, Scar mobilization, Vestibular training, DME instructions, Cryotherapy, and Moist heat  PLAN FOR NEXT SESSION:   start light band exercises, ROM and strength progressions as able   Left shoulder AAROM and PROM for 3 week then ok for strengthening (starting 04/02/24)   NEXT MD VISIT: April 16, 2024 with Dr. Addie Susannah Daring, PT, DPT 04/17/24 8:52 AM

## 2024-04-18 ENCOUNTER — Ambulatory Visit (INDEPENDENT_AMBULATORY_CARE_PROVIDER_SITE_OTHER)

## 2024-04-18 DIAGNOSIS — M25612 Stiffness of left shoulder, not elsewhere classified: Secondary | ICD-10-CM

## 2024-04-18 DIAGNOSIS — M6281 Muscle weakness (generalized): Secondary | ICD-10-CM | POA: Diagnosis not present

## 2024-04-18 DIAGNOSIS — M25512 Pain in left shoulder: Secondary | ICD-10-CM | POA: Diagnosis not present

## 2024-04-18 DIAGNOSIS — G8929 Other chronic pain: Secondary | ICD-10-CM

## 2024-04-18 DIAGNOSIS — M62838 Other muscle spasm: Secondary | ICD-10-CM

## 2024-04-18 DIAGNOSIS — R2689 Other abnormalities of gait and mobility: Secondary | ICD-10-CM

## 2024-04-18 DIAGNOSIS — R600 Localized edema: Secondary | ICD-10-CM

## 2024-04-22 ENCOUNTER — Encounter: Payer: Self-pay | Admitting: Physical Therapy

## 2024-04-22 ENCOUNTER — Ambulatory Visit (INDEPENDENT_AMBULATORY_CARE_PROVIDER_SITE_OTHER): Admitting: Physical Therapy

## 2024-04-22 DIAGNOSIS — M6281 Muscle weakness (generalized): Secondary | ICD-10-CM | POA: Diagnosis not present

## 2024-04-22 DIAGNOSIS — M25612 Stiffness of left shoulder, not elsewhere classified: Secondary | ICD-10-CM | POA: Diagnosis not present

## 2024-04-22 DIAGNOSIS — R600 Localized edema: Secondary | ICD-10-CM | POA: Diagnosis not present

## 2024-04-22 DIAGNOSIS — M25512 Pain in left shoulder: Secondary | ICD-10-CM | POA: Diagnosis not present

## 2024-04-22 DIAGNOSIS — G8929 Other chronic pain: Secondary | ICD-10-CM

## 2024-04-22 NOTE — Therapy (Signed)
 OUTPATIENT PHYSICAL THERAPY TREATMENT   Patient Name: Frances Nelson MRN: [id number] DOB:1955-07-31, 68 y.o., female Today's Date: 04/22/2024  END OF SESSION:  PT End of Session - 04/22/24 1001     Visit Number 12    Number of Visits 30    Date for Recertification  06/12/24    Authorization Type [employer] AND [employer] [place]    Progress Note Due on Visit 20    PT Start Time 0935    PT Stop Time 1013    PT Time Calculation (min) 38 min    Activity Tolerance Patient limited by pain    Behavior During Therapy [facility] for tasks assessed/performed             Past Medical History:  Diagnosis Date   Allergy 01.01.1975   Ambulates with cane    straight cane   Arthritis    Asthma    Bronchitis, obstructive, chronic (HCC)    hx   Cataracts, bilateral    pt has not had surgery to remove as of 02/15/24   Complication of anesthesia    patient states slow to wake up after anesthesia   COPD (chronic obstructive pulmonary disease) (HCC)    Dyspnea    only with exertion   Fibromyalgia    GERD (gastroesophageal reflux disease)    Headache    HLD (hyperlipidemia)    Hypertension    Pneumonia    x several   Past Surgical History:  Procedure Laterality Date   ABDOMINAL HYSTERECTOMY  8.19.1990   APPENDECTOMY  9.1.1980   BICEPT TENODESIS Left 02/21/2024   Procedure: TENODESIS, BICEPS;  Surgeon: Addie Cordella Hamilton, MD;  Location: [facility] OR;  Service: Orthopedics;  Laterality: Left;   CYST EXCISION     upper right side lateral - benign   HERNIA REPAIR     Umbilical x 3   KNEE ARTHROSCOPY W/ MENISCAL REPAIR Left 12/2022   POSTERIOR LUMBAR FUSION 2 WITH HARDWARE REMOVAL Left 02/21/2024   Procedure: ARTHROSCOPY, SHOULDER WITH DEBRIDEMENT;  Surgeon: Addie Cordella Hamilton, MD;  Location: [facility] OR;  Service: Orthopedics;  Laterality: Left;  left shoulder arthroscopy, debridement, biceps tenodesis, mini open rotator cuff tear repair   SHOULDER OPEN ROTATOR CUFF REPAIR Left 02/21/2024    Procedure: REPAIR, ROTATOR CUFF, OPEN;  Surgeon: Addie Cordella Hamilton, MD;  Location: [facility] OR;  Service: Orthopedics;  Laterality: Left;   SPINE SURGERY  05/2021   TONSILLECTOMY     TRANSFORAMINAL LUMBAR INTERBODY FUSION (TLIF) WITH PEDICLE SCREW FIXATION 1 LEVEL N/A 05/18/2022   Procedure: Lumbar Four-Five Open Laminectomy/Transforaminal Lumbar Interbody Fusion/Posterolateral fusion;  Surgeon: Cheryle Debby LABOR, MD;  Location: [facility] OR;  Service: Neurosurgery;  Laterality: N/A;   Patient Active Problem List   Diagnosis Date Noted   Nail disorder 04/05/2024   Synovitis of left shoulder 03/09/2024   Biceps tendonitis on left 03/09/2024   Degenerative superior labral anterior-to-posterior (SLAP) tear of left shoulder 03/09/2024   Complete tear of left rotator cuff 03/09/2024   Obesity 10/13/2023   Shingles outbreak 05/29/2023   Microhematuria 04/09/2023   Bilateral shoulder pain 04/09/2023   Urethritis 10/05/2022   External otitis of left ear 10/05/2022   Spondylolisthesis of lumbar region 05/18/2022   Smoker 04/08/2022   Deviated nasal septum 04/08/2022   Bilateral pain of leg and foot 04/08/2022   Follicular acne 04/06/2022   Baker cyst, left 04/06/2022   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   Depression 10/04/2021   Left knee  pain 10/04/2021   Chronic pain 04/09/2021   Pelvic pain 04/09/2021   HLD (hyperlipidemia) 04/09/2021   Vitamin D  deficiency 04/09/2021   Estrogen deficiency 04/09/2021   Hyperglycemia 04/01/2021   Chronic cough 12/30/2020   GERD (gastroesophageal reflux disease) 11/06/2019   Bilateral leg edema 10/16/2019   Pleuritic chest pain 10/16/2019   Hypertension 10/02/2019   Arthralgia 06/28/2018   Sinusitis 06/28/2018   Renal cyst 05/03/2018   Syncope 05/03/2018   Stress and adjustment reaction 05/03/2018   History of tobacco use 05/02/2018   Primary osteoarthritis of both hands 04/10/2018   Primary osteoarthritis of both  knees 04/10/2018   Primary osteoarthritis of both feet 04/10/2018   Achilles tendinitis 04/02/2018   Screening for breast cancer 03/15/2018   Positive ANA (antinuclear antibody) 03/15/2018   Screening for colon cancer 03/15/2018   Rash 03/15/2018   Achilles tendon pain 11/13/2017   Cervical myelopathy with cervical radiculopathy (HCC) 11/13/2017   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   Allergic rhinitis 06/19/2017   Edema 12/24/2015   Carpal tunnel syndrome, right 12/16/2015   Hypercalcemia 11/11/2015   Pain in both upper extremities 11/11/2015   Drug reaction 10/28/2015   Muscle spasms of both lower extremities 10/28/2015   Nerve pain 10/28/2015   Chronic asthmatic bronchitis with acute exacerbation (HCC) 09/06/2015   Environmental and seasonal allergies 07/17/2014    PCP: Lynwood LELON Rush, MD   REFERRING PROVIDER: Cordella Glendia Hutchinson, MD   REFERRING DIAG: [phone] (ICD-10-CM) - S/P rotator cuff repair  THERAPY DIAG:  Chronic left shoulder pain  Stiffness of left shoulder, not elsewhere classified  Muscle weakness (generalized)  Localized edema  Rationale for Evaluation and Treatment: Rehabilitation  ONSET DATE: 02/21/2024 left shoulder arthroscopy with debridement biceps tenodesis rotator cuff tear repair with a 2 x 3 construct  SUBJECTIVE:                                                                                                                                                                                      SUBJECTIVE STATEMENT: Still having pain in Lt shoulder.    Hand dominance: Right  PERTINENT HISTORY: Patient endorsed having gone through PT and multiple shoulder injection prior to appointment with Dr. Hutchinson, who scheduled and performed her surgery. Patient notes that her original pain started in Feb 2025, though did have a fall down stairs Feb 2024 and is not sure if that is when she injured shoulder as she did not have pain at that time. Patient used CPM  daily and wears sling only in public at this time, but is working on weaning from both. Patient endorses difficulty with sleeping and can average  5 hours per night when not in so much pain. Patient has undergone knee surgery (2023), back surgery (2024), and also currently has bilat carpal tunnel with n/t in fingers and hands.  PAIN:   NPRS scale: 6/10 Pain location: Lt shoulder/arm Pain description: all pain descriptors, but most of the time achey Aggravating factors: moving a certain way, reaching, trying to do hair, picking something up   Relieving factors: tylenol  arthritis twice a day, tramadol  as needed (mid morning, before bed), muscle relaxer, arnica herb (gel for bruising and pain), lidocaine  patches, ice    PRECAUTIONS: Shoulder  RED FLAGS: None   WEIGHT BEARING RESTRICTIONS: Yes Lt shoulder   FALLS:  Has patient fallen in last 6 months? Yes. Number of falls 1, slipped in the bath tub   LIVING ENVIRONMENT: Lives with: lives with their family and lives with their son Lives in: House/apartment Stairs: Yes: Internal: 15 steps; on left going up and External: 3 steps; none Has following equipment at home: Single point cane, Quad cane small base, Walker - 2 wheeled, shower chair, Shower bench, and Grab bars  OCCUPATION: Retired; Print production planner   PLOF: Independent and Independent with community mobility with device  PATIENT GOALS: to be able to use my arm again   OBJECTIVE:  Note: Objective measures were completed at Evaluation unless otherwise noted.  DIAGNOSTIC FINDINGS:    PATIENT SURVEYS :  PSFS: THE PATIENT SPECIFIC FUNCTIONAL SCALE  Place score of 0-10 (0 = unable to perform activity and 10 = able to perform activity at the same level as before injury or problem)  Activity Date: 03/20/2024  04/16/24   Get dressed and putting shoes on  4 4   2. Brush and style your hair / showering  1 5   3. Cook  2 6   4. Washing dishes  1  4   5. Clean, sweep, mop  1 2   Total  Score 1.8 4.2     Total Score = Sum of activity scores/number of activities  Minimally Detectable Change: 3 points (for single activity); 2 points (for average score)  [facility] (nd). The Patient Specific Functional Scale . Retrieved from [email or web address]   COGNITION: Overall cognitive status: Within functional limits for tasks assessed     SENSATION: Light touch: diminished in fingers secondary to carpal tunnel Carpal tunnel leading to n/t in fingers;   POSTURE: Increased thoracic kyphosis, forward head, and forward rounded shoulders   UPPER EXTREMITY ROM:    ROM Right Eval 03/20/2024 Left Eval 03/20/2024 Left 04/04/24 Left 04/16/24  Shoulder flexion WFL 85 AAROM, highly painful A: 15  (supine) A: 112  (sitting) 140 (supine) P: 147  Shoulder extension      Shoulder abduction WFL 60 AAROM, highly painful  A: 85  (sitting) 101 (supine) P: 109  Shoulder adduction      Shoulder internal rotation T8 75 AAROM at 45deg abd in supine  P: 85 in 60 deg abdct  Shoulder external rotation T1 23 AAROM at 45deg abd in supine  P: 60 in 60 deg abdct  (Blank rows = not tested)  UPPER EXTREMITY MMT: unable to perform on eval due to protocol restrictions  MMT Right Eval 03/20/2024 Left Eval 03/20/2024  Shoulder flexion    Shoulder extension    Shoulder abduction    Shoulder adduction    Shoulder internal rotation    Shoulder external rotation    Middle trapezius    Lower trapezius  Elbow flexion    Elbow extension    Wrist flexion    Wrist extension    Wrist ulnar deviation    Wrist radial deviation    Wrist pronation    Wrist supination    Grip strength (lbs)    (Blank rows = not tested)  SHOULDER SPECIAL TESTS: Did not perform on eval   JOINT MOBILITY TESTING:  Not assessed on eval   PALPATION:  Not assessed one val    TREATMENT DATE:  04/22/24 TherAct UE ranger flexion x10 reps  Wall ladder  flexion/scaption x 10 reps UBE L2.5 x 6 min (3 min each direction) Rows L3 band 3x10; 3 sec hold Shoulder ext L3 band 3x10   04/18/2024 TherEx:  UBE with bilt UE seat 8, level 2 for 3 min fwd/3 min back  Standing UE ranger into shoulder flexion  Standing circles with ball on wall, elbow at 90deg 1x60s CW and CCW Supine AAROM abduction with PT assist, 1x10 with 3-5s hold each movement ; decreased assist with each rep  Supine AAROM shoulder flexion with 2# bar, 1x11, 1x8   Vaso:  Medium compression to Lt shoulder for 10 minutes at 34deg    04/16/24 Pulleys flexion and scaption x 3 min each ROM measurements - see above for details Chest press 2x10; 2# bar Supine AA flexion 2# bar 2x10 Shoulder circles x20 CW/CCW in 90 deg flexion Seated active ER 2x10 Standing AA flexion with 2# bar 2x10   04/14/2024 TherEx:  Pulleys for ROM: shoulder flexion for 3 minutes, shoulder scaption for 3 minutes Supine PT assisted AAROM abduction 1x6 with 10 hold in max stretch each rep, able to reach 99deg on last rep Supine chest press with 2# bar 1x10  Supine shoulder flexion 2x5 with 2# bar PT discussed protocol and what to expect coming up   Vaso:   Medium compression to Lt shoulder for 10 minutes at 34deg   04/11/2024 TherEx:  Pulleys for ROM: shoulder flexion for 3 minutes, shoulder scaption for 3 minutes Sidelying ER AROM 2x14  Sidelying abduction AROM 1x8 with pain/discomfort with eccentric motion  Supine shoulder flexion AROM 1x2 ; discontinued secondary to pain and discomfort   TherAct:  Seated circles with UE ranger 2x1 minute CW and CCW  AAROM shoulder scaption to first and second shelf in cabinet with AROM eccentric motion 1x3 , 1x5   Vaso:  Medium compression to Lt shoulder for 10 minutes at 34deg    04/09/2024 TherEx:  Pulleys for ROM: shoulder flexion for 3 minutes, shoulder scaption for 3 minutes  Seated circles with UE ranger 2x1 minute CW and CCW  ER walkouts with yellow  TB 2x5 with 2-3s hold with intermittent tactile cues for appropriate form PT discussed HEP and how to attain appropriate contraction for isometric strengthening without causing pain  TherAct: AAROM shoulder flexion to first and second shelves in cabinet with AROM eccentric movement 2x5 Patient able to increase challenge by completing AROM concentric with intermittent Rt UE use   Vaso: Medium compression to Lt shoulder for 10 minutes at 34deg        PATIENT EDUCATION: Education details: HEP, POC, typical recovery from rotator cuff repair Person educated: Patient and Child(ren) Education method: Explanation, Demonstration, Verbal cues, and Handouts Education comprehension: verbalized understanding  HOME EXERCISE PROGRAM: Access Code: [email or web address] URL: [email or web address] Date: 03/20/2024 Prepared by: Susannah Daring  Exercises - Circular Shoulder Pendulum with Table Support  - 1 x daily - 7 x weekly - 3  sets - 10 reps - Flexion-Extension Shoulder Pendulum with Table Support  - 1 x daily - 7 x weekly - 3 sets - 10 reps - Horizontal Shoulder Pendulum with Table Support  - 1 x daily - 7 x weekly - 3 sets - 10 reps - Seated Scapular Retraction  - 1 x daily - 7 x weekly - 3 sets - 10 reps - Seated AAROM Shoulder Elevation/Depression  - 1 x daily - 7 x weekly - 3 sets - 10 reps  ASSESSMENT:  CLINICAL IMPRESSION:  Pt tolerated session well today but limited due to continued pain in Lt shoulder.  Will continue to monitor pain.  Continue skilled PT.    OBJECTIVE IMPAIRMENTS: decreased activity tolerance, decreased coordination, decreased endurance, decreased mobility, decreased ROM, decreased strength, decreased safety awareness, hypomobility, increased edema, impaired flexibility, impaired sensation, impaired UE functional use, improper body mechanics, postural dysfunction, obesity, and pain.   ACTIVITY LIMITATIONS: carrying, lifting, sleeping, bed mobility, bathing,  dressing, reach over head, and hygiene/grooming  PARTICIPATION LIMITATIONS: meal prep, cleaning, driving, community activity, and yard work  PERSONAL FACTORS: Past/current experiences, Time since onset of injury/illness/exacerbation, and 3+ comorbidities: HTN, hyperlipidemia, GERD, fibromyalgia, COPD, dyspnea, cataracts, chronic obstructive bronchitis, asthma, arthritis, history of sleep apnea are also affecting patient's functional outcome.   REHAB POTENTIAL: Good  CLINICAL DECISION MAKING: Evolving/moderate complexity  EVALUATION COMPLEXITY: Moderate  GOALS: Goals reviewed with patient? Yes  SHORT TERM GOALS: Target date: 04/24/2024  Patient will show compliance with initial HEP. Baseline: Goal status: MET 04/16/24  2.  Patient will report overall pain levels no greater than 5/10. Baseline:  Goal status: ongoing 04/16/24  3.  Patient will gain full PROM and AAROM of Lt shoulder to improve functional mobility. Baseline:  Goal status: ongoing 04/16/24  4.  Patient will increase PSFS to at least 3.8 to show a significant improvement to subjective disability rating.  Baseline:  Goal status: MET 04/16/24   LONG TERM GOALS: Target date: 06/12/2024  Patient will be independent with final HEP in order to maintain and progress upon functional gains made within PT. Baseline:  Goal status: ongoing 04/16/24  2.  Patient will report overall pain levels no greater than 2/10. Baseline:  Goal status: ongoing 04/16/24  3.   Patient will increase PSFS to at least 5.8 to show a significant improvement to subjective disability rating.  Baseline:  Goal status: ongoing 04/16/24  4.  Patient will return to full AROM of Lt shoulder in order to improve overall functional mobility.  Baseline:  Goal status: ongoing 04/16/24  5.  Patient will show strength in all motions in Lt shoulder to at least 4-/5 in order to show improved biomechanics with functional mobility.  Baseline:  Goal status:  ongoing 04/16/24  6.  Patient will report improvement in overall sleep through the night in order to show improved overall quality of life. Baseline:  Goal status: ongoing 04/16/24  PLAN: PT FREQUENCY: 2-3x/wk for 6 weeks, 1-2x/wk for 6 weeks   PT DURATION: 12 weeks  PLANNED INTERVENTIONS: 97164- PT Re-evaluation, 97750- Physical Performance Testing, 97110-Therapeutic exercises, 97530- Therapeutic activity, [id number]- Neuromuscular re-education, 97535- Self Care, [id number]- Manual therapy, [id number]- Gait training, [phone]- Canalith repositioning, H9716- Electrical stimulation (unattended), [phone]- Electrical stimulation (manual), 97016- Vasopneumatic device, [id number]- Ultrasound, [id number]- Traction (mechanical), [id number]- Ionotophoresis 4mg /ml Dexamethasone , 79439 (1-2 muscles), 20561 (3+ muscles)- Dry Needling, Patient/Family education, Balance training, Stair training, Taping, Joint mobilization, Joint manipulation, Spinal manipulation, Spinal mobilization, Scar mobilization, Vestibular training, DME instructions, Cryotherapy, and  Moist heat  PLAN FOR NEXT SESSION:  continue band exercises, ROM and strength progressions as able   Left shoulder AAROM and PROM for 3 week then ok for strengthening (starting 04/02/24)   NEXT MD VISIT: April 16, 2024 with Dr. Addie Corean JULIANNA Alvia, PT, DPT 04/22/24 10:16 AM

## 2024-04-24 ENCOUNTER — Ambulatory Visit: Admitting: Physical Therapy

## 2024-04-24 ENCOUNTER — Encounter: Payer: Self-pay | Admitting: Rehabilitative and Restorative Service Providers"

## 2024-04-24 DIAGNOSIS — M6281 Muscle weakness (generalized): Secondary | ICD-10-CM

## 2024-04-24 DIAGNOSIS — M25512 Pain in left shoulder: Secondary | ICD-10-CM

## 2024-04-24 DIAGNOSIS — R600 Localized edema: Secondary | ICD-10-CM | POA: Diagnosis not present

## 2024-04-24 DIAGNOSIS — G8929 Other chronic pain: Secondary | ICD-10-CM

## 2024-04-24 DIAGNOSIS — M25612 Stiffness of left shoulder, not elsewhere classified: Secondary | ICD-10-CM

## 2024-04-24 NOTE — Therapy (Signed)
 OUTPATIENT PHYSICAL THERAPY TREATMENT   Patient Name: Frances Nelson MRN: [id number] DOB:03/30/1956, 68 y.o., female Today's Date: 04/24/2024  END OF SESSION:  PT End of Session - 04/24/24 0935     Visit Number 13    Number of Visits 30    Date for Recertification  06/12/24    Authorization Type [employer] AND [employer] [place]    Progress Note Due on Visit 20    PT Start Time 0930    PT Stop Time 1020    PT Time Calculation (min) 50 min    Activity Tolerance Patient limited by pain    Behavior During Therapy [facility] for tasks assessed/performed              Past Medical History:  Diagnosis Date   Allergy 01.01.1975   Ambulates with cane    straight cane   Arthritis    Asthma    Bronchitis, obstructive, chronic (HCC)    hx   Cataracts, bilateral    pt has not had surgery to remove as of 02/15/24   Complication of anesthesia    patient states slow to wake up after anesthesia   COPD (chronic obstructive pulmonary disease) (HCC)    Dyspnea    only with exertion   Fibromyalgia    GERD (gastroesophageal reflux disease)    Headache    HLD (hyperlipidemia)    Hypertension    Pneumonia    x several   Past Surgical History:  Procedure Laterality Date   ABDOMINAL HYSTERECTOMY  8.19.1990   APPENDECTOMY  9.1.1980   BICEPT TENODESIS Left 02/21/2024   Procedure: TENODESIS, BICEPS;  Surgeon: Addie Cordella Hamilton, MD;  Location: [facility] OR;  Service: Orthopedics;  Laterality: Left;   CYST EXCISION     upper right side lateral - benign   HERNIA REPAIR     Umbilical x 3   KNEE ARTHROSCOPY W/ MENISCAL REPAIR Left 12/2022   POSTERIOR LUMBAR FUSION 2 WITH HARDWARE REMOVAL Left 02/21/2024   Procedure: ARTHROSCOPY, SHOULDER WITH DEBRIDEMENT;  Surgeon: Addie Cordella Hamilton, MD;  Location: [facility] OR;  Service: Orthopedics;  Laterality: Left;  left shoulder arthroscopy, debridement, biceps tenodesis, mini open rotator cuff tear repair   SHOULDER OPEN ROTATOR CUFF REPAIR Left 02/21/2024    Procedure: REPAIR, ROTATOR CUFF, OPEN;  Surgeon: Addie Cordella Hamilton, MD;  Location: [facility] OR;  Service: Orthopedics;  Laterality: Left;   SPINE SURGERY  05/2021   TONSILLECTOMY     TRANSFORAMINAL LUMBAR INTERBODY FUSION (TLIF) WITH PEDICLE SCREW FIXATION 1 LEVEL N/A 05/18/2022   Procedure: Lumbar Four-Five Open Laminectomy/Transforaminal Lumbar Interbody Fusion/Posterolateral fusion;  Surgeon: Cheryle Debby LABOR, MD;  Location: [facility] OR;  Service: Neurosurgery;  Laterality: N/A;   Patient Active Problem List   Diagnosis Date Noted   Nail disorder 04/05/2024   Synovitis of left shoulder 03/09/2024   Biceps tendonitis on left 03/09/2024   Degenerative superior labral anterior-to-posterior (SLAP) tear of left shoulder 03/09/2024   Complete tear of left rotator cuff 03/09/2024   Obesity 10/13/2023   Shingles outbreak 05/29/2023   Microhematuria 04/09/2023   Bilateral shoulder pain 04/09/2023   Urethritis 10/05/2022   External otitis of left ear 10/05/2022   Spondylolisthesis of lumbar region 05/18/2022   Smoker 04/08/2022   Deviated nasal septum 04/08/2022   Bilateral pain of leg and foot 04/08/2022   Follicular acne 04/06/2022   Baker cyst, left 04/06/2022   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   Depression 10/04/2021   Left  knee pain 10/04/2021   Chronic pain 04/09/2021   Pelvic pain 04/09/2021   HLD (hyperlipidemia) 04/09/2021   Vitamin D  deficiency 04/09/2021   Estrogen deficiency 04/09/2021   Hyperglycemia 04/01/2021   Chronic cough 12/30/2020   GERD (gastroesophageal reflux disease) 11/06/2019   Bilateral leg edema 10/16/2019   Pleuritic chest pain 10/16/2019   Hypertension 10/02/2019   Arthralgia 06/28/2018   Sinusitis 06/28/2018   Renal cyst 05/03/2018   Syncope 05/03/2018   Stress and adjustment reaction 05/03/2018   History of tobacco use 05/02/2018   Primary osteoarthritis of both hands 04/10/2018   Primary osteoarthritis of both  knees 04/10/2018   Primary osteoarthritis of both feet 04/10/2018   Achilles tendinitis 04/02/2018   Screening for breast cancer 03/15/2018   Positive ANA (antinuclear antibody) 03/15/2018   Screening for colon cancer 03/15/2018   Rash 03/15/2018   Achilles tendon pain 11/13/2017   Cervical myelopathy with cervical radiculopathy (HCC) 11/13/2017   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   Allergic rhinitis 06/19/2017   Edema 12/24/2015   Carpal tunnel syndrome, right 12/16/2015   Hypercalcemia 11/11/2015   Pain in both upper extremities 11/11/2015   Drug reaction 10/28/2015   Muscle spasms of both lower extremities 10/28/2015   Nerve pain 10/28/2015   Chronic asthmatic bronchitis with acute exacerbation (HCC) 09/06/2015   Environmental and seasonal allergies 07/17/2014    PCP: Lynwood LELON Rush, MD   REFERRING PROVIDER: Cordella Glendia Hutchinson, MD   REFERRING DIAG: [id number] (ICD-10-CM) - S/P rotator cuff repair  THERAPY DIAG:  Chronic left shoulder pain  Stiffness of left shoulder, not elsewhere classified  Muscle weakness (generalized)  Localized edema  Rationale for Evaluation and Treatment: Rehabilitation  ONSET DATE: 02/21/2024 left shoulder arthroscopy with debridement biceps tenodesis rotator cuff tear repair with a 2 x 3 construct  SUBJECTIVE:                                                                                                                                                                                      SUBJECTIVE STATEMENT: Pt indicated consistent symptoms most of the day.  Reported last week was more painful.  Pt indicated anterior pain noted.    Hand dominance: Right  PERTINENT HISTORY: Patient endorsed having gone through PT and multiple shoulder injection prior to appointment with Dr. Hutchinson, who scheduled and performed her surgery. Patient notes that her original pain started in Feb 2025, though did have a fall down stairs Feb 2024 and is not sure if that  is when she injured shoulder as she did not have pain at that time. Patient used CPM daily and wears sling only in public at this  time, but is working on weaning from both. Patient endorses difficulty with sleeping and can average 5 hours per night when not in so much pain. Patient has undergone knee surgery (2023), back surgery (2024), and also currently has bilat carpal tunnel with n/t in fingers and hands.  PAIN:   NPRS scale: 5/10 Pain location: Lt shoulder/arm Pain description: all pain descriptors, but most of the time achey Aggravating factors: moving a certain way, reaching, trying to do hair, picking something up   Relieving factors: tylenol  arthritis twice a day, tramadol  as needed (mid morning, before bed), muscle relaxer, arnica herb (gel for bruising and pain), lidocaine  patches, ice    PRECAUTIONS: Shoulder  RED FLAGS: None   WEIGHT BEARING RESTRICTIONS: Yes Lt shoulder   FALLS:  Has patient fallen in last 6 months? Yes. Number of falls 1, slipped in the bath tub   LIVING ENVIRONMENT: Lives with: lives with their family and lives with their son Lives in: House/apartment Stairs: Yes: Internal: 15 steps; on left going up and External: 3 steps; none Has following equipment at home: Single point cane, Quad cane small base, Walker - 2 wheeled, shower chair, Shower bench, and Grab bars  OCCUPATION: Retired; Print production planner   PLOF: Independent and Independent with community mobility with device  PATIENT GOALS: to be able to use my arm again   OBJECTIVE:  Note: Objective measures were completed at Evaluation unless otherwise noted.  DIAGNOSTIC FINDINGS:    PATIENT SURVEYS :  PSFS: THE PATIENT SPECIFIC FUNCTIONAL SCALE  Place score of 0-10 (0 = unable to perform activity and 10 = able to perform activity at the same level as before injury or problem)  Activity Date: 03/20/2024  04/16/24   Get dressed and putting shoes on  4 4   2. Brush and style your hair / showering   1 5   3. Cook  2 6   4. Washing dishes  1  4   5. Clean, sweep, mop  1 2   Total Score 1.8 4.2     Total Score = Sum of activity scores/number of activities  Minimally Detectable Change: 3 points (for single activity); 2 points (for average score)  [facility] (nd). The Patient Specific Functional Scale . Retrieved from [email or web address]   COGNITION: 03/20/2024 Overall cognitive status: Within functional limits for tasks assessed     SENSATION: 03/20/2024 Light touch: diminished in fingers secondary to carpal tunnel Carpal tunnel leading to n/t in fingers;   POSTURE: 03/20/2024 Increased thoracic kyphosis, forward head, and forward rounded shoulders   UPPER EXTREMITY ROM:    ROM Right Eval 03/20/2024 Left Eval 03/20/2024 Left 04/04/24 Left 04/16/24  Shoulder flexion WFL 85 AAROM, highly painful A: 15  (supine) A: 112  (sitting) 140 (supine) P: 147  Shoulder extension      Shoulder abduction WFL 60 AAROM, highly painful  A: 85  (sitting) 101 (supine) P: 109  Shoulder adduction      Shoulder internal rotation T8 75 AAROM at 45deg abd in supine  P: 85 in 60 deg abdct  Shoulder external rotation T1 23 AAROM at 45deg abd in supine  P: 60 in 60 deg abdct  (Blank rows = not tested)  UPPER EXTREMITY MMT:  03/20/2024 unable to perform on eval due to protocol restrictions  MMT Right Eval 03/20/2024 Left Eval 03/20/2024 Right 04/24/2024 Left 04/24/2024  Shoulder flexion    2/5 c pain Unable to lift full range against gravity.  Shoulder extension      Shoulder abduction      Shoulder adduction      Shoulder internal rotation      Shoulder external rotation      Middle trapezius      Lower trapezius      Elbow flexion      Elbow extension      Wrist flexion      Wrist extension      Wrist ulnar deviation      Wrist radial deviation      Wrist pronation      Wrist supination      Grip strength (lbs)       (Blank rows = not tested)  SHOULDER SPECIAL TESTS: 03/20/2024 Did not perform on eval   JOINT MOBILITY TESTING:  03/20/2024 Not assessed on eval   PALPATION:  03/20/2024 Not assessed on eval                   TREATMENT       DATE: 04/24/2024 Therex: Supine wand AAROM flexion 1 lb bar 2 x 10  Added to HEP with updates and cues for activity.    Neuro Re-ed: (scapular control, muscle coordination,activation) Standing green band bilateral rows c scapular retraction focus 2 x 15 Standing green band GH ext 2 x 15 Standing isometric non pain increasing amount 5 sec on/off 90 seconds flexion, abduction, ER Lt arm.  Cues for techniques.    TherActivity: (to improve push/pull, elevation, reaching) UBE fwd/back 3 mins each way with :30 second rest break between directions.  Lvl 2.5  UE ranger flexion AAROM to elevation flexion 2 x 15 Lt arm with ipsilateral step in.   Vaso:  Medium compression to Lt shoulder for 10 minutes at 34deg   TREATMENT       DATE: 04/22/24 TherAct UE ranger flexion x10 reps  Wall ladder flexion/scaption x 10 reps UBE L2.5 x 6 min (3 min each direction) Rows L3 band 3x10; 3 sec hold Shoulder ext L3 band 3x10   TREATMENT       DATE:04/18/2024 TherEx:  UBE with bilt UE seat 8, level 2 for 3 min fwd/3 min back  Standing UE ranger into shoulder flexion  Standing circles with ball on wall, elbow at 90deg 1x60s CW and CCW Supine AAROM abduction with PT assist, 1x10 with 3-5s hold each movement ; decreased assist with each rep  Supine AAROM shoulder flexion with 2# bar, 1x11, 1x8   Vaso:  Medium compression to Lt shoulder for 10 minutes at 34deg    TREATMENT       DATE:04/16/24 Pulleys flexion and scaption x 3 min each ROM measurements - see above for details Chest press 2x10; 2# bar Supine AA flexion 2# bar 2x10 Shoulder circles x20 CW/CCW in 90 deg flexion Seated active ER 2x10 Standing AA flexion with 2# bar 2x10  PATIENT EDUCATION: Eval: Education  details: HEP, POC, typical recovery from rotator cuff repair Person educated: Patient and Child(ren) Education method: Explanation, Demonstration, Verbal cues, and Handouts Education comprehension: verbalized understanding  HOME EXERCISE PROGRAM: Access Code: [email or web address] URL: [email or web address] Date: 03/20/2024 Prepared by: Susannah Daring  Exercises - Circular Shoulder Pendulum with Table Support  - 1 x daily - 7 x weekly - 3 sets - 10 reps - Flexion-Extension Shoulder Pendulum with Table Support  - 1 x daily - 7 x weekly - 3 sets - 10 reps - Horizontal Shoulder Pendulum with Table Support  - 1 x  daily - 7 x weekly - 3 sets - 10 reps - Seated Scapular Retraction  - 1 x daily - 7 x weekly - 3 sets - 10 reps - Seated AAROM Shoulder Elevation/Depression  - 1 x daily - 7 x weekly - 3 sets - 10 reps  ASSESSMENT:  CLINICAL IMPRESSION:  Pt presented with struggle in active range transitioning due to weakness/ fatigue and pain symptoms.  Updated HEP to reflect progression in clinic with AAROM, isometrics.  Continued skilled PT services indicated at this time.     OBJECTIVE IMPAIRMENTS: decreased activity tolerance, decreased coordination, decreased endurance, decreased mobility, decreased ROM, decreased strength, decreased safety awareness, hypomobility, increased edema, impaired flexibility, impaired sensation, impaired UE functional use, improper body mechanics, postural dysfunction, obesity, and pain.   ACTIVITY LIMITATIONS: carrying, lifting, sleeping, bed mobility, bathing, dressing, reach over head, and hygiene/grooming  PARTICIPATION LIMITATIONS: meal prep, cleaning, driving, community activity, and yard work  PERSONAL FACTORS: Past/current experiences, Time since onset of injury/illness/exacerbation, and 3+ comorbidities: HTN, hyperlipidemia, GERD, fibromyalgia, COPD, dyspnea, cataracts, chronic obstructive bronchitis, asthma, arthritis, history of sleep apnea are also  affecting patient's functional outcome.   REHAB POTENTIAL: Good  CLINICAL DECISION MAKING: Evolving/moderate complexity  EVALUATION COMPLEXITY: Moderate  GOALS: Goals reviewed with patient? Yes  SHORT TERM GOALS: Target date: 04/24/2024  Patient will show compliance with initial HEP. Baseline: Goal status: MET 04/16/24  2.  Patient will report overall pain levels no greater than 5/10. Baseline:  Goal status: ongoing 04/16/24  3.  Patient will gain full PROM and AAROM of Lt shoulder to improve functional mobility. Baseline:  Goal status: ongoing 04/16/24  4.  Patient will increase PSFS to at least 3.8 to show a significant improvement to subjective disability rating.  Baseline:  Goal status: MET 04/16/24   LONG TERM GOALS: Target date: 06/12/2024  Patient will be independent with final HEP in order to maintain and progress upon functional gains made within PT. Baseline:  Goal status: ongoing 04/16/24  2.  Patient will report overall pain levels no greater than 2/10. Baseline:  Goal status: ongoing 04/16/24  3.   Patient will increase PSFS to at least 5.8 to show a significant improvement to subjective disability rating.  Baseline:  Goal status: ongoing 04/16/24  4.  Patient will return to full AROM of Lt shoulder in order to improve overall functional mobility.  Baseline:  Goal status: ongoing 04/16/24  5.  Patient will show strength in all motions in Lt shoulder to at least 4-/5 in order to show improved biomechanics with functional mobility.  Baseline:  Goal status: ongoing 04/16/24  6.  Patient will report improvement in overall sleep through the night in order to show improved overall quality of life. Baseline:  Goal status: ongoing 04/16/24  PLAN: PT FREQUENCY: 2-3x/wk for 6 weeks, 1-2x/wk for 6 weeks   PT DURATION: 12 weeks  PLANNED INTERVENTIONS: [id number]- PT Re-evaluation, 97750- Physical Performance Testing, 97110-Therapeutic exercises, 97530- Therapeutic  activity, [id number]- Neuromuscular re-education, 97535- Self Care, [id number]- Manual therapy, [id number]- Gait training, [phone]- Canalith repositioning, H9716- Electrical stimulation (unattended), [phone]- Electrical stimulation (manual), [id number]- Vasopneumatic device, [id number]- Ultrasound, [id number]- Traction (mechanical), [id number]- Ionotophoresis 4mg /ml Dexamethasone , 79439 (1-2 muscles), [id number] (3+ muscles)- Dry Needling, Patient/Family education, Balance training, Stair training, Taping, Joint mobilization, Joint manipulation, Spinal manipulation, Spinal mobilization, Scar mobilization, Vestibular training, DME instructions, Cryotherapy, and Moist heat  PLAN FOR NEXT SESSION:  AAROM to AROM progression, avoiding shrug - use gravity reduced positioning.    NEXT MD VISIT:  April 16, 2024 with Dr. Addie Ozell Silvan, PT, DPT, OCS, ATC 04/24/24  10:12 AM

## 2024-04-26 ENCOUNTER — Other Ambulatory Visit: Payer: Self-pay | Admitting: Internal Medicine

## 2024-04-28 ENCOUNTER — Ambulatory Visit: Admitting: Physical Therapy

## 2024-04-28 ENCOUNTER — Encounter: Payer: Self-pay | Admitting: Physical Therapy

## 2024-04-28 ENCOUNTER — Other Ambulatory Visit: Payer: Self-pay

## 2024-04-28 DIAGNOSIS — G8929 Other chronic pain: Secondary | ICD-10-CM

## 2024-04-28 DIAGNOSIS — M6281 Muscle weakness (generalized): Secondary | ICD-10-CM | POA: Diagnosis not present

## 2024-04-28 DIAGNOSIS — M25512 Pain in left shoulder: Secondary | ICD-10-CM

## 2024-04-28 DIAGNOSIS — M25612 Stiffness of left shoulder, not elsewhere classified: Secondary | ICD-10-CM | POA: Diagnosis not present

## 2024-04-28 DIAGNOSIS — R600 Localized edema: Secondary | ICD-10-CM | POA: Diagnosis not present

## 2024-04-28 NOTE — Therapy (Signed)
 OUTPATIENT PHYSICAL THERAPY TREATMENT   Patient Name: Frances Nelson MRN: [id number] DOB:02/24/1956, 68 y.o., female Today's Date: 04/28/2024  END OF SESSION:  PT End of Session - 04/28/24 1155     Visit Number 14    Number of Visits 30    Date for Recertification  06/12/24    Authorization Type [employer] [place]    Progress Note Due on Visit 20    PT Start Time 1148    PT Stop Time 1228    PT Time Calculation (min) 40 min    Activity Tolerance Patient limited by pain    Behavior During Therapy [facility] for tasks assessed/performed               Past Medical History:  Diagnosis Date   Allergy 01.01.1975   Ambulates with cane    straight cane   Arthritis    Asthma    Bronchitis, obstructive, chronic (HCC)    hx   Cataracts, bilateral    pt has not had surgery to remove as of 02/15/24   Complication of anesthesia    patient states slow to wake up after anesthesia   COPD (chronic obstructive pulmonary disease) (HCC)    Dyspnea    only with exertion   Fibromyalgia    GERD (gastroesophageal reflux disease)    Headache    HLD (hyperlipidemia)    Hypertension    Pneumonia    x several   Past Surgical History:  Procedure Laterality Date   ABDOMINAL HYSTERECTOMY  8.19.1990   APPENDECTOMY  9.1.1980   BICEPT TENODESIS Left 02/21/2024   Procedure: TENODESIS, BICEPS;  Surgeon: Addie Cordella Hamilton, MD;  Location: [facility] OR;  Service: Orthopedics;  Laterality: Left;   CYST EXCISION     upper right side lateral - benign   HERNIA REPAIR     Umbilical x 3   KNEE ARTHROSCOPY W/ MENISCAL REPAIR Left 12/2022   POSTERIOR LUMBAR FUSION 2 WITH HARDWARE REMOVAL Left 02/21/2024   Procedure: ARTHROSCOPY, SHOULDER WITH DEBRIDEMENT;  Surgeon: Addie Cordella Hamilton, MD;  Location: [facility] OR;  Service: Orthopedics;  Laterality: Left;  left shoulder arthroscopy, debridement, biceps tenodesis, mini open rotator cuff tear repair   SHOULDER OPEN ROTATOR CUFF REPAIR Left 02/21/2024    Procedure: REPAIR, ROTATOR CUFF, OPEN;  Surgeon: Addie Cordella Hamilton, MD;  Location: [facility] OR;  Service: Orthopedics;  Laterality: Left;   SPINE SURGERY  05/2021   TONSILLECTOMY     TRANSFORAMINAL LUMBAR INTERBODY FUSION (TLIF) WITH PEDICLE SCREW FIXATION 1 LEVEL N/A 05/18/2022   Procedure: Lumbar Four-Five Open Laminectomy/Transforaminal Lumbar Interbody Fusion/Posterolateral fusion;  Surgeon: Cheryle Debby LABOR, MD;  Location: [facility] OR;  Service: Neurosurgery;  Laterality: N/A;   Patient Active Problem List   Diagnosis Date Noted   Nail disorder 04/05/2024   Synovitis of left shoulder 03/09/2024   Biceps tendonitis on left 03/09/2024   Degenerative superior labral anterior-to-posterior (SLAP) tear of left shoulder 03/09/2024   Complete tear of left rotator cuff 03/09/2024   Obesity 10/13/2023   Shingles outbreak 05/29/2023   Microhematuria 04/09/2023   Bilateral shoulder pain 04/09/2023   Urethritis 10/05/2022   External otitis of left ear 10/05/2022   Spondylolisthesis of lumbar region 05/18/2022   Smoker 04/08/2022   Deviated nasal septum 04/08/2022   Bilateral pain of leg and foot 04/08/2022   Follicular acne 04/06/2022   Baker cyst, left 04/06/2022   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   Depression 10/04/2021  Left knee pain 10/04/2021   Chronic pain 04/09/2021   Pelvic pain 04/09/2021   HLD (hyperlipidemia) 04/09/2021   Vitamin D  deficiency 04/09/2021   Estrogen deficiency 04/09/2021   Hyperglycemia 04/01/2021   Chronic cough 12/30/2020   GERD (gastroesophageal reflux disease) 11/06/2019   Bilateral leg edema 10/16/2019   Pleuritic chest pain 10/16/2019   Hypertension 10/02/2019   Arthralgia 06/28/2018   Sinusitis 06/28/2018   Renal cyst 05/03/2018   Syncope 05/03/2018   Stress and adjustment reaction 05/03/2018   History of tobacco use 05/02/2018   Primary osteoarthritis of both hands 04/10/2018   Primary osteoarthritis of both  knees 04/10/2018   Primary osteoarthritis of both feet 04/10/2018   Achilles tendinitis 04/02/2018   Screening for breast cancer 03/15/2018   Positive ANA (antinuclear antibody) 03/15/2018   Screening for colon cancer 03/15/2018   Rash 03/15/2018   Achilles tendon pain 11/13/2017   Cervical myelopathy with cervical radiculopathy (HCC) 11/13/2017   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   Allergic rhinitis 06/19/2017   Edema 12/24/2015   Carpal tunnel syndrome, right 12/16/2015   Hypercalcemia 11/11/2015   Pain in both upper extremities 11/11/2015   Drug reaction 10/28/2015   Muscle spasms of both lower extremities 10/28/2015   Nerve pain 10/28/2015   Chronic asthmatic bronchitis with acute exacerbation (HCC) 09/06/2015   Environmental and seasonal allergies 07/17/2014    PCP: Lynwood LELON Rush, MD   REFERRING PROVIDER: Cordella Glendia Hutchinson, MD   REFERRING DIAG: [phone] (ICD-10-CM) - S/P rotator cuff repair  THERAPY DIAG:  Chronic left shoulder pain  Stiffness of left shoulder, not elsewhere classified  Muscle weakness (generalized)  Localized edema  Rationale for Evaluation and Treatment: Rehabilitation  ONSET DATE: 02/21/2024 left shoulder arthroscopy with debridement biceps tenodesis rotator cuff tear repair with a 2 x 3 construct  SUBJECTIVE:                                                                                                                                                                                      SUBJECTIVE STATEMENT: Doing well; shoulder only has min pain   Hand dominance: Right  PERTINENT HISTORY: Patient endorsed having gone through PT and multiple shoulder injection prior to appointment with Dr. Hutchinson, who scheduled and performed her surgery. Patient notes that her original pain started in Feb 2025, though did have a fall down stairs Feb 2024 and is not sure if that is when she injured shoulder as she did not have pain at that time. Patient used CPM  daily and wears sling only in public at this time, but is working on weaning from both. Patient endorses difficulty with sleeping and  can average 5 hours per night when not in so much pain. Patient has undergone knee surgery (2023), back surgery (2024), and also currently has bilat carpal tunnel with n/t in fingers and hands.  PAIN:   NPRS scale: minimal - did not rate/10 Pain location: Lt shoulder/arm Pain description: all pain descriptors, but most of the time achey Aggravating factors: moving a certain way, reaching, trying to do hair, picking something up   Relieving factors: tylenol  arthritis twice a day, tramadol  as needed (mid morning, before bed), muscle relaxer, arnica herb (gel for bruising and pain), lidocaine  patches, ice    PRECAUTIONS: Shoulder  RED FLAGS: None   WEIGHT BEARING RESTRICTIONS: Yes Lt shoulder   FALLS:  Has patient fallen in last 6 months? Yes. Number of falls 1, slipped in the bath tub   LIVING ENVIRONMENT: Lives with: lives with their family and lives with their son Lives in: House/apartment Stairs: Yes: Internal: 15 steps; on left going up and External: 3 steps; none Has following equipment at home: Single point cane, Quad cane small base, Walker - 2 wheeled, shower chair, Shower bench, and Grab bars  OCCUPATION: Retired; Print production planner   PLOF: Independent and Independent with community mobility with device  PATIENT GOALS: to be able to use my arm again   OBJECTIVE:  Note: Objective measures were completed at Evaluation unless otherwise noted.  DIAGNOSTIC FINDINGS:    PATIENT SURVEYS :  PSFS: THE PATIENT SPECIFIC FUNCTIONAL SCALE  Place score of 0-10 (0 = unable to perform activity and 10 = able to perform activity at the same level as before injury or problem)  Activity Date: 03/20/2024  04/16/24   Get dressed and putting shoes on  4 4   2. Brush and style your hair / showering  1 5   3. Cook  2 6   4. Washing dishes  1  4   5. Clean,  sweep, mop  1 2   Total Score 1.8 4.2     Total Score = Sum of activity scores/number of activities  Minimally Detectable Change: 3 points (for single activity); 2 points (for average score)  [facility] (nd). The Patient Specific Functional Scale . Retrieved from [email or web address]   COGNITION: 03/20/2024 Overall cognitive status: Within functional limits for tasks assessed     SENSATION: 03/20/2024 Light touch: diminished in fingers secondary to carpal tunnel Carpal tunnel leading to n/t in fingers;   POSTURE: 03/20/2024 Increased thoracic kyphosis, forward head, and forward rounded shoulders   UPPER EXTREMITY ROM:    ROM Right Eval 03/20/2024 Left Eval 03/20/2024 Left 04/04/24 Left 04/16/24  Shoulder flexion WFL 85 AAROM, highly painful A: 15  (supine) A: 112  (sitting) 140 (supine) P: 147  Shoulder extension      Shoulder abduction WFL 60 AAROM, highly painful  A: 85  (sitting) 101 (supine) P: 109  Shoulder adduction      Shoulder internal rotation T8 75 AAROM at 45deg abd in supine  P: 85 in 60 deg abdct  Shoulder external rotation T1 23 AAROM at 45deg abd in supine  P: 60 in 60 deg abdct  (Blank rows = not tested)  UPPER EXTREMITY MMT:  03/20/2024 unable to perform on eval due to protocol restrictions  MMT Right Eval 03/20/2024 Left Eval 03/20/2024 Right 04/24/2024 Left 04/24/2024  Shoulder flexion    2/5 c pain Unable to lift full range against gravity.   Shoulder extension      Shoulder abduction  Shoulder adduction      Shoulder internal rotation      Shoulder external rotation      Middle trapezius      Lower trapezius      Elbow flexion      Elbow extension      Wrist flexion      Wrist extension      Wrist ulnar deviation      Wrist radial deviation      Wrist pronation      Wrist supination      Grip strength (lbs)      (Blank rows = not tested)  SHOULDER SPECIAL  TESTS: 03/20/2024 Did not perform on eval   JOINT MOBILITY TESTING:  03/20/2024 Not assessed on eval   PALPATION:  03/20/2024 Not assessed on eval                   TREATMENT 04/28/24 TherAct UE ranger flexion, scaption 2x10 Pulleys flexion and scaption x 3 min each UBE L2 x 6:30 (3 min each direction with 30 sec rest between) Standing isometrics 10 x 5 sec hold each:  Flexion Abduction ER Seated flexion with 1# bar 2x10 Seated abduction with 1# bar AA x10 reps    04/24/2024 Therex: Supine wand AAROM flexion 1 lb bar 2 x 10  Added to HEP with updates and cues for activity.    Neuro Re-ed: (scapular control, muscle coordination,activation) Standing green band bilateral rows c scapular retraction focus 2 x 15 Standing green band GH ext 2 x 15 Standing isometric non pain increasing amount 5 sec on/off 90 seconds flexion, abduction, ER Lt arm.  Cues for techniques.    TherActivity: (to improve push/pull, elevation, reaching) UBE fwd/back 3 mins each way with :30 second rest break between directions.  Lvl 2.5  UE ranger flexion AAROM to elevation flexion 2 x 15 Lt arm with ipsilateral step in.   Vaso:  Medium compression to Lt shoulder for 10 minutes at 34deg   04/22/24 TherAct UE ranger flexion x10 reps  Wall ladder flexion/scaption x 10 reps UBE L2.5 x 6 min (3 min each direction) Rows L3 band 3x10; 3 sec hold Shoulder ext L3 band 3x10    PATIENT EDUCATION: Eval: Education details: HEP, POC, typical recovery from rotator cuff repair Person educated: Patient and Child(ren) Education method: Explanation, Demonstration, Verbal cues, and Handouts Education comprehension: verbalized understanding  HOME EXERCISE PROGRAM: Access Code: [email or web address] URL: [email or web address] Date: 04/28/2024 Prepared by: Corean Ku  Exercises - Seated Scapular Retraction  - 1 x daily - 7 x weekly - 3 sets - 10 reps - Supine Shoulder Flexion Extension AAROM with  Dowel  - 2-3 x daily - 7 x weekly - 1-2 sets - 10-15 reps - 3 hold - Supine Shoulder Circles  - 2-3 x daily - 7 x weekly - 1-2 sets - 20-30 reps - Standing Isometric Shoulder External Rotation with Doorway  - 1-2 x daily - 7 x weekly - 1 sets - 10 reps - 5-10 hold - Standing Isometric Shoulder Flexion with Doorway - Arm Bent  - 1-2 x daily - 7 x weekly - 1 sets - 10 reps - 5-10 hold - Standing Isometric Shoulder Abduction with Doorway - Arm Bent  - 1-2 x daily - 7 x weekly - 1 sets - 10 reps - 5-10 hold  ASSESSMENT:  CLINICAL IMPRESSION:  Pt tolerated session well today with improved active motion in sitting/standing today.  Continue skilled PT.  OBJECTIVE IMPAIRMENTS: decreased activity tolerance, decreased coordination, decreased endurance, decreased mobility, decreased ROM, decreased strength, decreased safety awareness, hypomobility, increased edema, impaired flexibility, impaired sensation, impaired UE functional use, improper body mechanics, postural dysfunction, obesity, and pain.   ACTIVITY LIMITATIONS: carrying, lifting, sleeping, bed mobility, bathing, dressing, reach over head, and hygiene/grooming  PARTICIPATION LIMITATIONS: meal prep, cleaning, driving, community activity, and yard work  PERSONAL FACTORS: Past/current experiences, Time since onset of injury/illness/exacerbation, and 3+ comorbidities: HTN, hyperlipidemia, GERD, fibromyalgia, COPD, dyspnea, cataracts, chronic obstructive bronchitis, asthma, arthritis, history of sleep apnea are also affecting patient's functional outcome.   REHAB POTENTIAL: Good  CLINICAL DECISION MAKING: Evolving/moderate complexity  EVALUATION COMPLEXITY: Moderate  GOALS: Goals reviewed with patient? Yes  SHORT TERM GOALS: Target date: 04/24/2024  Patient will show compliance with initial HEP. Baseline: Goal status: MET 04/16/24  2.  Patient will report overall pain levels no greater than 5/10. Baseline:  Goal status: ongoing  04/16/24  3.  Patient will gain full PROM and AAROM of Lt shoulder to improve functional mobility. Baseline:  Goal status: ongoing 04/16/24  4.  Patient will increase PSFS to at least 3.8 to show a significant improvement to subjective disability rating.  Baseline:  Goal status: MET 04/16/24   LONG TERM GOALS: Target date: 06/12/2024  Patient will be independent with final HEP in order to maintain and progress upon functional gains made within PT. Baseline:  Goal status: ongoing 04/16/24  2.  Patient will report overall pain levels no greater than 2/10. Baseline:  Goal status: ongoing 04/16/24  3.   Patient will increase PSFS to at least 5.8 to show a significant improvement to subjective disability rating.  Baseline:  Goal status: ongoing 04/16/24  4.  Patient will return to full AROM of Lt shoulder in order to improve overall functional mobility.  Baseline:  Goal status: ongoing 04/16/24  5.  Patient will show strength in all motions in Lt shoulder to at least 4-/5 in order to show improved biomechanics with functional mobility.  Baseline:  Goal status: ongoing 04/16/24  6.  Patient will report improvement in overall sleep through the night in order to show improved overall quality of life. Baseline:  Goal status: ongoing 04/16/24  PLAN: PT FREQUENCY: 2-3x/wk for 6 weeks, 1-2x/wk for 6 weeks   PT DURATION: 12 weeks  PLANNED INTERVENTIONS: [id number]- PT Re-evaluation, 97750- Physical Performance Testing, 97110-Therapeutic exercises, 97530- Therapeutic activity, [id number]- Neuromuscular re-education, 97535- Self Care, [id number]- Manual therapy, [id number]- Gait training, [phone]- Canalith repositioning, H9716- Electrical stimulation (unattended), [phone]- Electrical stimulation (manual), [id number]- Vasopneumatic device, [id number]- Ultrasound, [id number]- Traction (mechanical), [id number]- Ionotophoresis 4mg /ml Dexamethasone , [id number] (1-2 muscles), [id number] (3+ muscles)- Dry Needling, Patient/Family education, Balance training,  Stair training, Taping, Joint mobilization, Joint manipulation, Spinal manipulation, Spinal mobilization, Scar mobilization, Vestibular training, DME instructions, Cryotherapy, and Moist heat  PLAN FOR NEXT SESSION:  AAROM to AROM progression, avoiding shrug - use gravity reduced positioning.    NEXT MD VISIT: 05/30/24     Corean JULIANNA Ku, PT, DPT 04/28/24 12:44 PM

## 2024-04-29 ENCOUNTER — Ambulatory Visit (INDEPENDENT_AMBULATORY_CARE_PROVIDER_SITE_OTHER): Admitting: Internal Medicine

## 2024-04-29 ENCOUNTER — Encounter: Payer: Self-pay | Admitting: Internal Medicine

## 2024-04-29 VITALS — BP 120/76 | HR 90 | Temp 99.1°F | Ht 64.0 in | Wt 216.0 lb

## 2024-04-29 DIAGNOSIS — E559 Vitamin D deficiency, unspecified: Secondary | ICD-10-CM

## 2024-04-29 DIAGNOSIS — E7800 Pure hypercholesterolemia, unspecified: Secondary | ICD-10-CM

## 2024-04-29 DIAGNOSIS — F17200 Nicotine dependence, unspecified, uncomplicated: Secondary | ICD-10-CM

## 2024-04-29 DIAGNOSIS — I10 Essential (primary) hypertension: Secondary | ICD-10-CM

## 2024-04-29 DIAGNOSIS — R739 Hyperglycemia, unspecified: Secondary | ICD-10-CM

## 2024-04-29 DIAGNOSIS — R609 Edema, unspecified: Secondary | ICD-10-CM | POA: Diagnosis not present

## 2024-04-29 NOTE — Assessment & Plan Note (Signed)
 Last vitamin D Lab Results  Component Value Date   VD25OH 44.88 10/11/2023   Stable, cont oral replacement

## 2024-04-29 NOTE — Assessment & Plan Note (Signed)
 Pt counsled to quit, pt not ready

## 2024-04-29 NOTE — Progress Notes (Signed)
 Patient ID: Frances Nelson, female   DOB: 1955/09/24, 68 y.o.   MRN: [id number]        Chief Complaint: follow up HTN, HLD and hyperglycemia , smoker, low vit d, peripheral edema       HPI:  Frances Nelson is a 68 y.o. female here overall doing ok, but Pt denies chest pain, increased sob or doe, wheezing, orthopnea, PND, palpitations, dizziness or syncope  Was unable to tolerate lasix  due to muscle cramps, now taking hct 25 mg every day with improved edema, but still mild.   Pt denies polydipsia, polyuria, or new focal neuro s/s.    Pt denies fever, wt loss, night sweats, loss of appetite, or other constitutional symptoms .  Has echo scheduled for oct 21       Wt Readings from Last 3 Encounters:  04/29/24 216 lb (98 kg)  04/03/24 217 lb 9.6 oz (98.7 kg)  02/21/24 215 lb (97.5 kg)   BP Readings from Last 3 Encounters:  04/29/24 120/76  04/03/24 126/74  02/21/24 133/61         Past Medical History:  Diagnosis Date   Allergy 01.01.1975   Ambulates with cane    straight cane   Arthritis    Asthma    Bronchitis, obstructive, chronic (HCC)    hx   Cataracts, bilateral    pt has not had surgery to remove as of 02/15/24   Complication of anesthesia    patient states slow to wake up after anesthesia   COPD (chronic obstructive pulmonary disease) (HCC)    Dyspnea    only with exertion   Fibromyalgia    GERD (gastroesophageal reflux disease)    Headache    HLD (hyperlipidemia)    Hypertension    Pneumonia    x several   Past Surgical History:  Procedure Laterality Date   ABDOMINAL HYSTERECTOMY  8.19.1990   APPENDECTOMY  9.1.1980   BICEPT TENODESIS Left 02/21/2024   Procedure: TENODESIS, BICEPS;  Surgeon: Addie Cordella Hamilton, MD;  Location: [facility] OR;  Service: Orthopedics;  Laterality: Left;   CYST EXCISION     upper right side lateral - benign   HERNIA REPAIR     Umbilical x 3   KNEE ARTHROSCOPY W/ MENISCAL REPAIR Left 12/2022   POSTERIOR LUMBAR FUSION 2 WITH HARDWARE REMOVAL  Left 02/21/2024   Procedure: ARTHROSCOPY, SHOULDER WITH DEBRIDEMENT;  Surgeon: Addie Cordella Hamilton, MD;  Location: [facility] OR;  Service: Orthopedics;  Laterality: Left;  left shoulder arthroscopy, debridement, biceps tenodesis, mini open rotator cuff tear repair   SHOULDER OPEN ROTATOR CUFF REPAIR Left 02/21/2024   Procedure: REPAIR, ROTATOR CUFF, OPEN;  Surgeon: Addie Cordella Hamilton, MD;  Location: [facility] OR;  Service: Orthopedics;  Laterality: Left;   SPINE SURGERY  05/2021   TONSILLECTOMY     TRANSFORAMINAL LUMBAR INTERBODY FUSION (TLIF) WITH PEDICLE SCREW FIXATION 1 LEVEL N/A 05/18/2022   Procedure: Lumbar Four-Five Open Laminectomy/Transforaminal Lumbar Interbody Fusion/Posterolateral fusion;  Surgeon: Cheryle Debby LABOR, MD;  Location: [facility] OR;  Service: Neurosurgery;  Laterality: N/A;    reports that she has been smoking cigarettes. She started smoking about 50 years ago. She has a 40.8 pack-year smoking history. She has never been exposed to tobacco smoke. She has never used smokeless tobacco. She reports current drug use. Drug: Amphetamines. She reports that she does not drink alcohol. family history includes Allergies in her son and son; Angina in her father; Arthritis in her mother; Asthma in her son, son,  and son; Brain cancer in her brother; COPD in her mother; Cancer in her brother, brother, brother, sister, and sister; Depression in her mother; Drug abuse in her son; Early death in her son; Hearing loss in her father and mother; Heart disease in her father; Kidney disease in her brother and sister; Leukemia in her sister; Parkinson's disease in her father. Allergies  Allergen Reactions   Symbicort  [Budesonide -Formoterol  Fumarate] Other (See Comments)    Patient reported dizziness, nausea, headaches and sore throat while using Symbicort  160. Reported on 09/03/17   Tramadol  Hives    Patient states can take tramadol  with a coating on the med as of 02/15/24.   Celebrex  [Celecoxib ]     Bleeding.      Chocolate Flavoring Agent (Non-Screening)    Ciprofloxacin Nausea And Vomiting    Headache, shaking.    Egg Protein-Containing Drug Products    Flavoring Agent (Non-Screening)     Unknown   Lisinopril  Cough    Pt off med for a week, some improvement   Lyrica [Pregabalin]     Made me out of it.    Penicillins     Patient preference   Wellbutrin [Bupropion]    Codeine Palpitations   Current Outpatient Medications on File Prior to Visit  Medication Sig Dispense Refill   acetaminophen  (TYLENOL ) 650 MG CR tablet Take 1,300 mg by mouth every 8 (eight) hours.     acidophilus (RISAQUAD) CAPS capsule Take 1 capsule by mouth in the morning.     albuterol  (PROVENTIL ) (2.5 MG/3ML) 0.083% nebulizer solution Take 3 mLs (2.5 mg total) by nebulization every 6 (six) hours as needed for wheezing or shortness of breath. 150 mL 5   albuterol  (VENTOLIN  HFA) 108 (90 Base) MCG/ACT inhaler Inhale 2 puffs into the lungs every 6 (six) hours as needed for wheezing or shortness of breath. 8 g 6   alendronate  (FOSAMAX ) 70 MG tablet Take 1 tablet (70 mg total) by mouth every 7 (seven) days. Take with a full glass of water on an empty stomach. 12 tablet 3   Calcium -Vitamins C & D (CALCIUM /C/D PO) Take 1 tablet by mouth in the morning.     cetirizine (ZYRTEC) 10 MG tablet Take 10 mg by mouth in the morning.     Cholecalciferol (HM VITAMIN D3 PO) Take 5,000 Units by mouth in the morning.     cyclobenzaprine  (FLEXERIL ) 10 MG tablet Take 1 tablet (10 mg total) by mouth every 8 (eight) hours. 30 tablet 0   DULoxetine  (CYMBALTA ) 30 MG capsule TAKE 1 CAPSULE IN THE MORNING AND 2 CAPSULES IN THE EVENING 270 capsule 4   ezetimibe  (ZETIA ) 10 MG tablet Take 1 tablet (10 mg total) by mouth daily. 90 tablet 3   gabapentin  (NEURONTIN ) 100 MG capsule TAKE 1 CAPSULE (100 MG TOTAL) BY MOUTH THREE TIMES DAILY. 90 capsule 5   lidocaine  (LMX) 4 % cream Apply 1 Application topically as needed.     losartan  (COZAAR ) 25 MG tablet TAKE 1  TABLET (25 MG TOTAL) BY MOUTH DAILY. 90 tablet 3   MELATONIN PO Take 5 mg by mouth at bedtime.     meloxicam  (MOBIC ) 15 MG tablet Take 1 tablet (15 mg total) by mouth daily. 30 tablet 0   methocarbamol  (ROBAXIN ) 750 MG tablet Take 1 tablet (750 mg total) by mouth every 8 (eight) hours as needed for muscle spasms. 60 tablet 2   mometasone  (NASONEX ) 50 MCG/ACT nasal spray Place 2 sprays into the nose every evening.  montelukast  (SINGULAIR ) 10 MG tablet TAKE 1 TABLET BY MOUTH EVERYDAY AT BEDTIME 90 tablet 3   Multiple Vitamins-Minerals (MULTIVITAMIN WITH MINERALS) tablet Take 1 tablet by mouth in the morning.     omeprazole  (PRILOSEC) 40 MG capsule TAKE 1 CAPSULE (40 MG TOTAL) BY MOUTH IN THE MORNING AND AT BEDTIME. 180 capsule 3   phentermine  37.5 MG capsule Take 1 capsule (37.5 mg total) by mouth every morning. 90 capsule 1   traMADol  (ULTRAM ) 50 MG tablet Take 1 tablet (50 mg total) by mouth every 6 (six) hours as needed. 60 tablet 1   No current facility-administered medications on file prior to visit.        ROS:  All others reviewed and negative.  Objective        PE:  BP 120/76 (BP Location: Right Arm, Patient Position: Sitting, Cuff Size: Normal)   Pulse 90   Temp 99.1 F (37.3 C) (Oral)   Ht 5' 4 (1.626 m)   Wt 216 lb (98 kg)   SpO2 96%   BMI 37.08 kg/m                 Constitutional: Pt appears in NAD               HENT: Head: NCAT.                Right Ear: External ear normal.                 Left Ear: External ear normal.                Eyes: . Pupils are equal, round, and reactive to light. Conjunctivae and EOM are normal               Nose: without d/c or deformity               Neck: Neck supple. Gross normal ROM               Cardiovascular: Normal rate and regular rhythm.                 Pulmonary/Chest: Effort normal and breath sounds without rales or wheezing.                Abd:  Soft, NT, ND, + BS, no organomegaly               Neurological: Pt is alert. At  baseline orientation, motor grossly intact               Skin: Skin is warm. No rashes, no other new lesions, LE edema - trace to 1+ bilateral               Psychiatric: Pt behavior is normal without agitation   Micro: none  Cardiac tracings I have personally interpreted today:  none  Pertinent Radiological findings (summarize): none   Lab Results  Component Value Date   WBC 8.3 02/15/2024   HGB 15.5 (H) 02/15/2024   HCT 47.2 (H) 02/15/2024   PLT 345 02/15/2024   GLUCOSE 90 02/15/2024   CHOL 207 (H) 10/11/2023   TRIG 208.0 (H) 10/11/2023   HDL 50.00 10/11/2023   LDLDIRECT 143.0 10/05/2022   LDLCALC 115 (H) 10/11/2023   ALT 33 10/11/2023   AST 28 10/11/2023   NA 139 02/15/2024   K 4.8 02/15/2024   CL 101 02/15/2024   CREATININE 0.71 02/15/2024   BUN 13 02/15/2024   CO2  30 02/15/2024   TSH 1.78 10/11/2023   HGBA1C 6.1 10/11/2023   MICROALBUR 1.4 10/11/2023   Assessment/Plan:  Frances Nelson is a 68 y.o. White or Caucasian [1] female with  has a past medical history of Allergy (01.01.1975), Ambulates with cane, Arthritis, Asthma, Bronchitis, obstructive, chronic (HCC), Cataracts, bilateral, Complication of anesthesia, COPD (chronic obstructive pulmonary disease) (HCC), Dyspnea, Fibromyalgia, GERD (gastroesophageal reflux disease), Headache, HLD (hyperlipidemia), Hypertension, and Pneumonia.  Edema Improved but still mild persistent uncontrolled, ok for change hct to triam 37.5 - 25 mg every day,  to f/u any worsening symptoms or concerns  HLD (hyperlipidemia) Lab Results  Component Value Date   LDLCALC 115 (H) 10/11/2023   uncontrolled, pt to continue  zetia  10 every day, declines statin or repatha  addition   Hyperglycemia Lab Results  Component Value Date   HGBA1C 6.1 10/11/2023   Stable, pt to continue current medical treatment  - diet, wt control   Hypertension BP Readings from Last 3 Encounters:  04/29/24 120/76  04/03/24 126/74  02/21/24 133/61    Stable, pt to continue medical treatment losartan  25 mg qd   Smoker Pt counsled to quit, pt not ready  Vitamin D  deficiency Last vitamin D  Lab Results  Component Value Date   VD25OH 44.88 10/11/2023   Stable, cont oral replacement  Followup: Return in about 6 months (around 10/28/2024).  Lynwood Rush, MD 04/29/2024 8:47 PM [facility]

## 2024-04-29 NOTE — Therapy (Signed)
 OUTPATIENT PHYSICAL THERAPY TREATMENT   Patient Name: Frances Nelson MRN: [id number] DOB:10/17/1955, 68 y.o., female Today's Date: 04/30/2024  END OF SESSION:  PT End of Session - 04/30/24 0847     Visit Number 15    Number of Visits 30    Date for Recertification  06/12/24    Authorization Type [employer] AND [employer] [place]    Progress Note Due on Visit 20    PT Start Time 0847    PT Stop Time 0926    PT Time Calculation (min) 39 min    Activity Tolerance Patient limited by pain    Behavior During Therapy [facility] for tasks assessed/performed           Past Medical History:  Diagnosis Date   Allergy 01.01.1975   Ambulates with cane    straight cane   Arthritis    Asthma    Bronchitis, obstructive, chronic (HCC)    hx   Cataracts, bilateral    pt has not had surgery to remove as of 02/15/24   Complication of anesthesia    patient states slow to wake up after anesthesia   COPD (chronic obstructive pulmonary disease) (HCC)    Dyspnea    only with exertion   Fibromyalgia    GERD (gastroesophageal reflux disease)    Headache    HLD (hyperlipidemia)    Hypertension    Pneumonia    x several   Past Surgical History:  Procedure Laterality Date   ABDOMINAL HYSTERECTOMY  8.19.1990   APPENDECTOMY  9.1.1980   BICEPT TENODESIS Left 02/21/2024   Procedure: TENODESIS, BICEPS;  Surgeon: Addie Cordella Hamilton, MD;  Location: [facility] OR;  Service: Orthopedics;  Laterality: Left;   CYST EXCISION     upper right side lateral - benign   HERNIA REPAIR     Umbilical x 3   KNEE ARTHROSCOPY W/ MENISCAL REPAIR Left 12/2022   POSTERIOR LUMBAR FUSION 2 WITH HARDWARE REMOVAL Left 02/21/2024   Procedure: ARTHROSCOPY, SHOULDER WITH DEBRIDEMENT;  Surgeon: Addie Cordella Hamilton, MD;  Location: [facility] OR;  Service: Orthopedics;  Laterality: Left;  left shoulder arthroscopy, debridement, biceps tenodesis, mini open rotator cuff tear repair   SHOULDER OPEN ROTATOR CUFF REPAIR Left 02/21/2024   Procedure:  REPAIR, ROTATOR CUFF, OPEN;  Surgeon: Addie Cordella Hamilton, MD;  Location: [facility] OR;  Service: Orthopedics;  Laterality: Left;   SPINE SURGERY  05/2021   TONSILLECTOMY     TRANSFORAMINAL LUMBAR INTERBODY FUSION (TLIF) WITH PEDICLE SCREW FIXATION 1 LEVEL N/A 05/18/2022   Procedure: Lumbar Four-Five Open Laminectomy/Transforaminal Lumbar Interbody Fusion/Posterolateral fusion;  Surgeon: Cheryle Debby LABOR, MD;  Location: [facility] OR;  Service: Neurosurgery;  Laterality: N/A;   Patient Active Problem List   Diagnosis Date Noted   Nail disorder 04/05/2024   Synovitis of left shoulder 03/09/2024   Biceps tendonitis on left 03/09/2024   Degenerative superior labral anterior-to-posterior (SLAP) tear of left shoulder 03/09/2024   Complete tear of left rotator cuff 03/09/2024   Obesity 10/13/2023   Shingles outbreak 05/29/2023   Microhematuria 04/09/2023   Bilateral shoulder pain 04/09/2023   Urethritis 10/05/2022   External otitis of left ear 10/05/2022   Spondylolisthesis of lumbar region 05/18/2022   Smoker 04/08/2022   Deviated nasal septum 04/08/2022   Bilateral pain of leg and foot 04/08/2022   Follicular acne 04/06/2022   Baker cyst, left 04/06/2022   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   Depression 10/04/2021   Left knee pain 10/04/2021  Chronic pain 04/09/2021   Pelvic pain 04/09/2021   HLD (hyperlipidemia) 04/09/2021   Vitamin D  deficiency 04/09/2021   Estrogen deficiency 04/09/2021   Hyperglycemia 04/01/2021   Chronic cough 12/30/2020   GERD (gastroesophageal reflux disease) 11/06/2019   Bilateral leg edema 10/16/2019   Pleuritic chest pain 10/16/2019   Hypertension 10/02/2019   Arthralgia 06/28/2018   Sinusitis 06/28/2018   Renal cyst 05/03/2018   Syncope 05/03/2018   Stress and adjustment reaction 05/03/2018   History of tobacco use 05/02/2018   Primary osteoarthritis of both hands 04/10/2018   Primary osteoarthritis of both knees  04/10/2018   Primary osteoarthritis of both feet 04/10/2018   Achilles tendinitis 04/02/2018   Screening for breast cancer 03/15/2018   Positive ANA (antinuclear antibody) 03/15/2018   Screening for colon cancer 03/15/2018   Rash 03/15/2018   Achilles tendon pain 11/13/2017   Cervical myelopathy with cervical radiculopathy (HCC) 11/13/2017   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   Allergic rhinitis 06/19/2017   Edema 12/24/2015   Carpal tunnel syndrome, right 12/16/2015   Hypercalcemia 11/11/2015   Pain in both upper extremities 11/11/2015   Drug reaction 10/28/2015   Muscle spasms of both lower extremities 10/28/2015   Nerve pain 10/28/2015   Chronic asthmatic bronchitis with acute exacerbation (HCC) 09/06/2015   Environmental and seasonal allergies 07/17/2014    PCP: Lynwood LELON Rush, MD   REFERRING PROVIDER: Cordella Glendia Hutchinson, MD   REFERRING DIAG: [phone] (ICD-10-CM) - S/P rotator cuff repair  THERAPY DIAG:  Chronic left shoulder pain  Stiffness of left shoulder, not elsewhere classified  Muscle weakness (generalized)  Localized edema  Other abnormalities of gait and mobility  Other muscle spasm  Rationale for Evaluation and Treatment: Rehabilitation  ONSET DATE: 02/21/2024 left shoulder arthroscopy with debridement biceps tenodesis rotator cuff tear repair with a 2 x 3 construct  SUBJECTIVE:                                                                                                                                                                                      SUBJECTIVE STATEMENT: Patient endorses no change in symptoms and has an upcoming ECHO at the end of the month.    Hand dominance: Right  PERTINENT HISTORY: Patient endorsed having gone through PT and multiple shoulder injection prior to appointment with Dr. Hutchinson, who scheduled and performed her surgery. Patient notes that her original pain started in Feb 2025, though did have a fall down stairs Feb  2024 and is not sure if that is when she injured shoulder as she did not have pain at that time. Patient used CPM daily and wears sling only in public  at this time, but is working on weaning from both. Patient endorses difficulty with sleeping and can average 5 hours per night when not in so much pain. Patient has undergone knee surgery (2023), back surgery (2024), and also currently has bilat carpal tunnel with n/t in fingers and hands.  PAIN:   NPRS scale: minimal - did not rate/10 Pain location: Lt shoulder/arm Pain description: all pain descriptors, but most of the time achey Aggravating factors: moving a certain way, reaching, trying to do hair, picking something up   Relieving factors: tylenol  arthritis twice a day, tramadol  as needed (mid morning, before bed), muscle relaxer, arnica herb (gel for bruising and pain), lidocaine  patches, ice    PRECAUTIONS: Shoulder  RED FLAGS: None   WEIGHT BEARING RESTRICTIONS: Yes Lt shoulder   FALLS:  Has patient fallen in last 6 months? Yes. Number of falls 1, slipped in the bath tub   LIVING ENVIRONMENT: Lives with: lives with their family and lives with their son Lives in: House/apartment Stairs: Yes: Internal: 15 steps; on left going up and External: 3 steps; none Has following equipment at home: Single point cane, Quad cane small base, Walker - 2 wheeled, shower chair, Shower bench, and Grab bars  OCCUPATION: Retired; Print production planner   PLOF: Independent and Independent with community mobility with device  PATIENT GOALS: to be able to use my arm again   OBJECTIVE:  Note: Objective measures were completed at Evaluation unless otherwise noted.  DIAGNOSTIC FINDINGS:    PATIENT SURVEYS :  PSFS: THE PATIENT SPECIFIC FUNCTIONAL SCALE  Place score of 0-10 (0 = unable to perform activity and 10 = able to perform activity at the same level as before injury or problem)  Activity Date: 03/20/2024  04/16/24   Get dressed and putting shoes on   4 4   2. Brush and style your hair / showering  1 5   3. Cook  2 6   4. Washing dishes  1  4   5. Clean, sweep, mop  1 2   Total Score 1.8 4.2     Total Score = Sum of activity scores/number of activities  Minimally Detectable Change: 3 points (for single activity); 2 points (for average score)  [facility] (nd). The Patient Specific Functional Scale . Retrieved from [email or web address]   COGNITION: 03/20/2024 Overall cognitive status: Within functional limits for tasks assessed     SENSATION: 03/20/2024 Light touch: diminished in fingers secondary to carpal tunnel Carpal tunnel leading to n/t in fingers;   POSTURE: 03/20/2024 Increased thoracic kyphosis, forward head, and forward rounded shoulders   UPPER EXTREMITY ROM:    ROM Right Eval 03/20/2024 Left Eval 03/20/2024 Left 04/04/24 Left 04/16/24  Shoulder flexion WFL 85 AAROM, highly painful A: 15  (supine) A: 112  (sitting) 140 (supine) P: 147  Shoulder extension      Shoulder abduction WFL 60 AAROM, highly painful  A: 85  (sitting) 101 (supine) P: 109  Shoulder adduction      Shoulder internal rotation T8 75 AAROM at 45deg abd in supine  P: 85 in 60 deg abdct  Shoulder external rotation T1 23 AAROM at 45deg abd in supine  P: 60 in 60 deg abdct  (Blank rows = not tested)  UPPER EXTREMITY MMT:  03/20/2024 unable to perform on eval due to protocol restrictions  MMT Right Eval 03/20/2024 Left Eval 03/20/2024 Right 04/24/2024 Left 04/24/2024  Shoulder flexion    2/5 c pain Unable to lift  full range against gravity.   Shoulder extension      Shoulder abduction      Shoulder adduction      Shoulder internal rotation      Shoulder external rotation      Middle trapezius      Lower trapezius      Elbow flexion      Elbow extension      Wrist flexion      Wrist extension      Wrist ulnar deviation      Wrist radial deviation      Wrist pronation       Wrist supination      Grip strength (lbs)      (Blank rows = not tested)  SHOULDER SPECIAL TESTS: 03/20/2024 Did not perform on eval   JOINT MOBILITY TESTING:  03/20/2024 Not assessed on eval   PALPATION:  03/20/2024 Not assessed on eval                   TREATMENT 04/30/2024 TherAct:  UBE level 2.5 4 min fwd/4 min back  Clean the mirror exercise into shoulder flexion 2x8 with 3s hold  Clean the mirror in shoulder flexion then Rt to Lt 2x3 each direction  Seated flexion with 1# bar 1x10, 1x20  Seated abduction with 1# bar 2x6  04/28/24 TherAct UE ranger flexion, scaption 2x10 Pulleys flexion and scaption x 3 min each UBE L2 x 6:30 (3 min each direction with 30 sec rest between) Standing isometrics 10 x 5 sec hold each:  Flexion Abduction ER Seated flexion with 1# bar 2x10 Seated abduction with 1# bar AA x10 reps    04/24/2024 Therex: Supine wand AAROM flexion 1 lb bar 2 x 10  Added to HEP with updates and cues for activity.    Neuro Re-ed: (scapular control, muscle coordination,activation) Standing green band bilateral rows c scapular retraction focus 2 x 15 Standing green band GH ext 2 x 15 Standing isometric non pain increasing amount 5 sec on/off 90 seconds flexion, abduction, ER Lt arm.  Cues for techniques.    TherActivity: (to improve push/pull, elevation, reaching) UBE fwd/back 3 mins each way with :30 second rest break between directions.  Lvl 2.5  UE ranger flexion AAROM to elevation flexion 2 x 15 Lt arm with ipsilateral step in.   Vaso:  Medium compression to Lt shoulder for 10 minutes at 34deg   04/22/24 TherAct UE ranger flexion x10 reps  Wall ladder flexion/scaption x 10 reps UBE L2.5 x 6 min (3 min each direction) Rows L3 band 3x10; 3 sec hold Shoulder ext L3 band 3x10    PATIENT EDUCATION: Eval: Education details: HEP, POC, typical recovery from rotator cuff repair Person educated: Patient and Child(ren) Education method:  Explanation, Demonstration, Verbal cues, and Handouts Education comprehension: verbalized understanding  HOME EXERCISE PROGRAM: Access Code: [email or web address] URL: [email or web address] Date: 04/28/2024 Prepared by: Corean Ku  Exercises - Seated Scapular Retraction  - 1 x daily - 7 x weekly - 3 sets - 10 reps - Supine Shoulder Flexion Extension AAROM with Dowel  - 2-3 x daily - 7 x weekly - 1-2 sets - 10-15 reps - 3 hold - Supine Shoulder Circles  - 2-3 x daily - 7 x weekly - 1-2 sets - 20-30 reps - Standing Isometric Shoulder External Rotation with Doorway  - 1-2 x daily - 7 x weekly - 1 sets - 10 reps - 5-10 hold - Standing Isometric Shoulder Flexion with Doorway -  Arm Bent  - 1-2 x daily - 7 x weekly - 1 sets - 10 reps - 5-10 hold - Standing Isometric Shoulder Abduction with Doorway - Arm Bent  - 1-2 x daily - 7 x weekly - 1 sets - 10 reps - 5-10 hold  ASSESSMENT:  CLINICAL IMPRESSION:  Patient arrived to session noting no change in symptoms since last session. Patient tolerated all activities this date with improved ROM. Patient will continue to benefit from skilled PT.     OBJECTIVE IMPAIRMENTS: decreased activity tolerance, decreased coordination, decreased endurance, decreased mobility, decreased ROM, decreased strength, decreased safety awareness, hypomobility, increased edema, impaired flexibility, impaired sensation, impaired UE functional use, improper body mechanics, postural dysfunction, obesity, and pain.   ACTIVITY LIMITATIONS: carrying, lifting, sleeping, bed mobility, bathing, dressing, reach over head, and hygiene/grooming  PARTICIPATION LIMITATIONS: meal prep, cleaning, driving, community activity, and yard work  PERSONAL FACTORS: Past/current experiences, Time since onset of injury/illness/exacerbation, and 3+ comorbidities: HTN, hyperlipidemia, GERD, fibromyalgia, COPD, dyspnea, cataracts, chronic obstructive bronchitis, asthma, arthritis, history of  sleep apnea are also affecting patient's functional outcome.   REHAB POTENTIAL: Good  CLINICAL DECISION MAKING: Evolving/moderate complexity  EVALUATION COMPLEXITY: Moderate  GOALS: Goals reviewed with patient? Yes  SHORT TERM GOALS: Target date: 04/24/2024  Patient will show compliance with initial HEP. Baseline: Goal status: MET 04/16/24  2.  Patient will report overall pain levels no greater than 5/10. Baseline:  Goal status: ongoing 04/16/24  3.  Patient will gain full PROM and AAROM of Lt shoulder to improve functional mobility. Baseline:  Goal status: ongoing 04/16/24  4.  Patient will increase PSFS to at least 3.8 to show a significant improvement to subjective disability rating.  Baseline:  Goal status: MET 04/16/24   LONG TERM GOALS: Target date: 06/12/2024  Patient will be independent with final HEP in order to maintain and progress upon functional gains made within PT. Baseline:  Goal status: ongoing 04/16/24  2.  Patient will report overall pain levels no greater than 2/10. Baseline:  Goal status: ongoing 04/16/24  3.   Patient will increase PSFS to at least 5.8 to show a significant improvement to subjective disability rating.  Baseline:  Goal status: ongoing 04/16/24  4.  Patient will return to full AROM of Lt shoulder in order to improve overall functional mobility.  Baseline:  Goal status: ongoing 04/16/24  5.  Patient will show strength in all motions in Lt shoulder to at least 4-/5 in order to show improved biomechanics with functional mobility.  Baseline:  Goal status: ongoing 04/16/24  6.  Patient will report improvement in overall sleep through the night in order to show improved overall quality of life. Baseline:  Goal status: ongoing 04/16/24  PLAN: PT FREQUENCY: 2-3x/wk for 6 weeks, 1-2x/wk for 6 weeks   PT DURATION: 12 weeks  PLANNED INTERVENTIONS: 97164- PT Re-evaluation, 97750- Physical Performance Testing, 97110-Therapeutic exercises,  97530- Therapeutic activity, [id number]- Neuromuscular re-education, 97535- Self Care, [id number]- Manual therapy, [id number]- Gait training, [phone]- Canalith repositioning, H9716- Electrical stimulation (unattended), [phone]- Electrical stimulation (manual), [id number]- Vasopneumatic device, [id number]- Ultrasound, [id number]- Traction (mechanical), [id number]- Ionotophoresis 4mg /ml Dexamethasone , 79439 (1-2 muscles), 20561 (3+ muscles)- Dry Needling, Patient/Family education, Balance training, Stair training, Taping, Joint mobilization, Joint manipulation, Spinal manipulation, Spinal mobilization, Scar mobilization, Vestibular training, DME instructions, Cryotherapy, and Moist heat  PLAN FOR NEXT SESSION:   AAROM to AROM progression, avoiding shrug - use gravity reduced positioning.    NEXT MD VISIT: 05/30/24     Susannah Daring,  PT, DPT 04/30/24 9:30 AM

## 2024-04-29 NOTE — Assessment & Plan Note (Signed)
 Improved but still mild persistent uncontrolled, ok for change hct to triam 37.5 - 25 mg every day,  to f/u any worsening symptoms or concerns

## 2024-04-29 NOTE — Assessment & Plan Note (Signed)
 BP Readings from Last 3 Encounters:  04/29/24 120/76  04/03/24 126/74  02/21/24 133/61   Stable, pt to continue medical treatment losartan  25 mg qd

## 2024-04-29 NOTE — Assessment & Plan Note (Signed)
 Lab Results  Component Value Date   LDLCALC 115 (H) 10/11/2023   uncontrolled, pt to continue  zetia  10 every day, declines statin or repatha  addition

## 2024-04-29 NOTE — Assessment & Plan Note (Signed)
 Lab Results  Component Value Date   HGBA1C 6.1 10/11/2023   Stable, pt to continue current medical treatment  - diet, wt control

## 2024-04-29 NOTE — Patient Instructions (Signed)
 Ok to change the HCT to the triamterene-HCT 37.5-25 mg per day  Please continue all other medications as before, and refills have been done if requested.  Please have the pharmacy call with any other refills you may need.  Please continue your efforts at being more active, low cholesterol diet, and weight control.  Please keep your appointments with your specialists as you may have planned - the Echo oct 21  Please quit smoking  Please make an Appointment to return in 6 months, or sooner if needed

## 2024-04-30 ENCOUNTER — Ambulatory Visit

## 2024-04-30 DIAGNOSIS — R600 Localized edema: Secondary | ICD-10-CM

## 2024-04-30 DIAGNOSIS — G8929 Other chronic pain: Secondary | ICD-10-CM

## 2024-04-30 DIAGNOSIS — M6281 Muscle weakness (generalized): Secondary | ICD-10-CM

## 2024-04-30 DIAGNOSIS — M25512 Pain in left shoulder: Secondary | ICD-10-CM | POA: Diagnosis not present

## 2024-04-30 DIAGNOSIS — M25612 Stiffness of left shoulder, not elsewhere classified: Secondary | ICD-10-CM | POA: Diagnosis not present

## 2024-04-30 DIAGNOSIS — R2689 Other abnormalities of gait and mobility: Secondary | ICD-10-CM

## 2024-04-30 DIAGNOSIS — M62838 Other muscle spasm: Secondary | ICD-10-CM

## 2024-05-06 ENCOUNTER — Ambulatory Visit (INDEPENDENT_AMBULATORY_CARE_PROVIDER_SITE_OTHER): Admitting: Podiatry

## 2024-05-06 ENCOUNTER — Encounter: Payer: Self-pay | Admitting: Podiatry

## 2024-05-06 DIAGNOSIS — M7661 Achilles tendinitis, right leg: Secondary | ICD-10-CM | POA: Diagnosis not present

## 2024-05-06 DIAGNOSIS — M7662 Achilles tendinitis, left leg: Secondary | ICD-10-CM

## 2024-05-06 DIAGNOSIS — M792 Neuralgia and neuritis, unspecified: Secondary | ICD-10-CM | POA: Diagnosis not present

## 2024-05-06 DIAGNOSIS — B351 Tinea unguium: Secondary | ICD-10-CM | POA: Diagnosis not present

## 2024-05-06 NOTE — Progress Notes (Unsigned)
 Thikc nails Left foot fell 2-3 years ago thougth she broke them and was told sprain at that time. Lost 3 nails . On the right fell in tub since then had a bruise bruise big toe Hammertoe left 2nd Home treatment  Pain clincii revall Heel lift

## 2024-05-06 NOTE — Therapy (Signed)
 OUTPATIENT PHYSICAL THERAPY TREATMENT   Patient Name: Frances Nelson MRN: [id number] DOB:10/01/1955, 68 y.o., female Today's Date: 05/07/2024  END OF SESSION:  PT End of Session - 05/07/24 1022     Visit Number 16    Number of Visits 30    Date for Recertification  06/12/24    Authorization Type [employer] AND [employer] [place]    Progress Note Due on Visit 20    PT Start Time 1019    PT Stop Time 1058    PT Time Calculation (min) 39 min    Activity Tolerance Patient limited by pain    Behavior During Therapy [facility] for tasks assessed/performed            Past Medical History:  Diagnosis Date   Allergy 01.01.1975   Ambulates with cane    straight cane   Arthritis    Asthma    Bronchitis, obstructive, chronic (HCC)    hx   Cataracts, bilateral    pt has not had surgery to remove as of 02/15/24   Complication of anesthesia    patient states slow to wake up after anesthesia   COPD (chronic obstructive pulmonary disease) (HCC)    Dyspnea    only with exertion   Fibromyalgia    GERD (gastroesophageal reflux disease)    Headache    HLD (hyperlipidemia)    Hypertension    Pneumonia    x several   Past Surgical History:  Procedure Laterality Date   ABDOMINAL HYSTERECTOMY  8.19.1990   APPENDECTOMY  9.1.1980   BICEPT TENODESIS Left 02/21/2024   Procedure: TENODESIS, BICEPS;  Surgeon: Addie Cordella Hamilton, MD;  Location: [facility] OR;  Service: Orthopedics;  Laterality: Left;   CYST EXCISION     upper right side lateral - benign   HERNIA REPAIR     Umbilical x 3   KNEE ARTHROSCOPY W/ MENISCAL REPAIR Left 12/2022   POSTERIOR LUMBAR FUSION 2 WITH HARDWARE REMOVAL Left 02/21/2024   Procedure: ARTHROSCOPY, SHOULDER WITH DEBRIDEMENT;  Surgeon: Addie Cordella Hamilton, MD;  Location: [facility] OR;  Service: Orthopedics;  Laterality: Left;  left shoulder arthroscopy, debridement, biceps tenodesis, mini open rotator cuff tear repair   SHOULDER OPEN ROTATOR CUFF REPAIR Left 02/21/2024    Procedure: REPAIR, ROTATOR CUFF, OPEN;  Surgeon: Addie Cordella Hamilton, MD;  Location: [facility] OR;  Service: Orthopedics;  Laterality: Left;   SPINE SURGERY  05/2021   TONSILLECTOMY     TRANSFORAMINAL LUMBAR INTERBODY FUSION (TLIF) WITH PEDICLE SCREW FIXATION 1 LEVEL N/A 05/18/2022   Procedure: Lumbar Four-Five Open Laminectomy/Transforaminal Lumbar Interbody Fusion/Posterolateral fusion;  Surgeon: Cheryle Debby LABOR, MD;  Location: [facility] OR;  Service: Neurosurgery;  Laterality: N/A;   Patient Active Problem List   Diagnosis Date Noted   Nail disorder 04/05/2024   Synovitis of left shoulder 03/09/2024   Biceps tendonitis on left 03/09/2024   Degenerative superior labral anterior-to-posterior (SLAP) tear of left shoulder 03/09/2024   Complete tear of left rotator cuff 03/09/2024   Obesity 10/13/2023   Shingles outbreak 05/29/2023   Microhematuria 04/09/2023   Bilateral shoulder pain 04/09/2023   Urethritis 10/05/2022   External otitis of left ear 10/05/2022   Spondylolisthesis of lumbar region 05/18/2022   Smoker 04/08/2022   Deviated nasal septum 04/08/2022   Bilateral pain of leg and foot 04/08/2022   Follicular acne 04/06/2022   Baker cyst, left 04/06/2022   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   Depression 10/04/2021   Left knee pain  10/04/2021   Chronic pain 04/09/2021   Pelvic pain 04/09/2021   HLD (hyperlipidemia) 04/09/2021   Vitamin D  deficiency 04/09/2021   Estrogen deficiency 04/09/2021   Hyperglycemia 04/01/2021   Chronic cough 12/30/2020   GERD (gastroesophageal reflux disease) 11/06/2019   Bilateral leg edema 10/16/2019   Pleuritic chest pain 10/16/2019   Hypertension 10/02/2019   Arthralgia 06/28/2018   Sinusitis 06/28/2018   Renal cyst 05/03/2018   Syncope 05/03/2018   Stress and adjustment reaction 05/03/2018   History of tobacco use 05/02/2018   Primary osteoarthritis of both hands 04/10/2018   Primary osteoarthritis of both  knees 04/10/2018   Primary osteoarthritis of both feet 04/10/2018   Achilles tendinitis 04/02/2018   Screening for breast cancer 03/15/2018   Positive ANA (antinuclear antibody) 03/15/2018   Screening for colon cancer 03/15/2018   Rash 03/15/2018   Achilles tendon pain 11/13/2017   Cervical myelopathy with cervical radiculopathy (HCC) 11/13/2017   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   Allergic rhinitis 06/19/2017   Edema 12/24/2015   Carpal tunnel syndrome, right 12/16/2015   Hypercalcemia 11/11/2015   Pain in both upper extremities 11/11/2015   Drug reaction 10/28/2015   Muscle spasms of both lower extremities 10/28/2015   Nerve pain 10/28/2015   Chronic asthmatic bronchitis with acute exacerbation (HCC) 09/06/2015   Environmental and seasonal allergies 07/17/2014    PCP: Lynwood LELON Rush, MD   REFERRING PROVIDER: Cordella Glendia Hutchinson, MD   REFERRING DIAG: [phone] (ICD-10-CM) - S/P rotator cuff repair  THERAPY DIAG:  Chronic left shoulder pain  Stiffness of left shoulder, not elsewhere classified  Muscle weakness (generalized)  Localized edema  Other abnormalities of gait and mobility  Other muscle spasm  Rationale for Evaluation and Treatment: Rehabilitation  ONSET DATE: 02/21/2024 left shoulder arthroscopy with debridement biceps tenodesis rotator cuff tear repair with a 2 x 3 construct  SUBJECTIVE:                                                                                                                                                                                      SUBJECTIVE STATEMENT: Patient endorses intermittent shooting/electrical pain in anterior shoulder that is aggravated by movement and touching.   Hand dominance: Right  PERTINENT HISTORY: Patient endorsed having gone through PT and multiple shoulder injection prior to appointment with Dr. Hutchinson, who scheduled and performed her surgery. Patient notes that her original pain started in Feb 2025,  though did have a fall down stairs Feb 2024 and is not sure if that is when she injured shoulder as she did not have pain at that time. Patient used CPM daily and wears sling only in public  at this time, but is working on weaning from both. Patient endorses difficulty with sleeping and can average 5 hours per night when not in so much pain. Patient has undergone knee surgery (2023), back surgery (2024), and also currently has bilat carpal tunnel with n/t in fingers and hands.  PAIN:   NPRS scale: 4/10 Pain location: Lt shoulder/arm Pain description: all pain descriptors, but most of the time achey Aggravating factors: moving a certain way, reaching, trying to do hair, picking something up   Relieving factors: tylenol  arthritis twice a day, tramadol  as needed (mid morning, before bed), muscle relaxer, arnica herb (gel for bruising and pain), lidocaine  patches, ice    PRECAUTIONS: Shoulder  RED FLAGS: None   WEIGHT BEARING RESTRICTIONS: Yes Lt shoulder   FALLS:  Has patient fallen in last 6 months? Yes. Number of falls 1, slipped in the bath tub   LIVING ENVIRONMENT: Lives with: lives with their family and lives with their son Lives in: House/apartment Stairs: Yes: Internal: 15 steps; on left going up and External: 3 steps; none Has following equipment at home: Single point cane, Quad cane small base, Walker - 2 wheeled, shower chair, Shower bench, and Grab bars  OCCUPATION: Retired; Print production planner   PLOF: Independent and Independent with community mobility with device  PATIENT GOALS: to be able to use my arm again   OBJECTIVE:  Note: Objective measures were completed at Evaluation unless otherwise noted.  DIAGNOSTIC FINDINGS:    PATIENT SURVEYS :  PSFS: THE PATIENT SPECIFIC FUNCTIONAL SCALE  Place score of 0-10 (0 = unable to perform activity and 10 = able to perform activity at the same level as before injury or problem)  Activity Date: 03/20/2024  04/16/24   Get dressed and  putting shoes on  4 4   2. Brush and style your hair / showering  1 5   3. Cook  2 6   4. Washing dishes  1  4   5. Clean, sweep, mop  1 2   Total Score 1.8 4.2     Total Score = Sum of activity scores/number of activities  Minimally Detectable Change: 3 points (for single activity); 2 points (for average score)  [facility] (nd). The Patient Specific Functional Scale . Retrieved from [email or web address]   COGNITION: 03/20/2024 Overall cognitive status: Within functional limits for tasks assessed     SENSATION: 03/20/2024 Light touch: diminished in fingers secondary to carpal tunnel Carpal tunnel leading to n/t in fingers;   POSTURE: 03/20/2024 Increased thoracic kyphosis, forward head, and forward rounded shoulders   UPPER EXTREMITY ROM:    ROM Right Eval 03/20/2024 Left Eval 03/20/2024 Left 04/04/24 Left 04/16/24  Shoulder flexion WFL 85 AAROM, highly painful A: 15  (supine) A: 112  (sitting) 140 (supine) P: 147  Shoulder extension      Shoulder abduction WFL 60 AAROM, highly painful  A: 85  (sitting) 101 (supine) P: 109  Shoulder adduction      Shoulder internal rotation T8 75 AAROM at 45deg abd in supine  P: 85 in 60 deg abdct  Shoulder external rotation T1 23 AAROM at 45deg abd in supine  P: 60 in 60 deg abdct  (Blank rows = not tested)  UPPER EXTREMITY MMT:  03/20/2024 unable to perform on eval due to protocol restrictions  MMT Right Eval 03/20/2024 Left Eval 03/20/2024 Right 04/24/2024 Left 04/24/2024  Shoulder flexion    2/5 c pain Unable to lift full range against gravity.  Shoulder extension      Shoulder abduction      Shoulder adduction      Shoulder internal rotation      Shoulder external rotation      Middle trapezius      Lower trapezius      Elbow flexion      Elbow extension      Wrist flexion      Wrist extension      Wrist ulnar deviation      Wrist radial deviation       Wrist pronation      Wrist supination      Grip strength (lbs)      (Blank rows = not tested)  SHOULDER SPECIAL TESTS: 03/20/2024 Did not perform on eval   JOINT MOBILITY TESTING:  03/20/2024 Not assessed on eval   PALPATION:  03/20/2024 Not assessed on eval                   TREATMENT 05/07/2024 TherAct:  UBE level 2.5 for 4 min fwd/4 min back   TherEx: UE ranger into shoulder flexion and scaption 1x20 each  Wall circles with pink ball, elbow extended and shoulder at 90deg; 2x30s CW and CCW  Seated shoulder flexion 2x10 with 2# bar  Seated abduction 2x12 with 2# bar   04/30/2024 TherAct:  UBE level 2.5 4 min fwd/4 min back  Clean the mirror exercise into shoulder flexion 2x8 with 3s hold  Clean the mirror in shoulder flexion then Rt to Lt 2x3 each direction  Seated flexion with 1# bar 1x10, 1x20  Seated abduction with 1# bar 2x6  04/28/24 TherAct UE ranger flexion, scaption 2x10 Pulleys flexion and scaption x 3 min each UBE L2 x 6:30 (3 min each direction with 30 sec rest between) Standing isometrics 10 x 5 sec hold each:  Flexion Abduction ER Seated flexion with 1# bar 2x10 Seated abduction with 1# bar AA x10 reps    04/24/2024 Therex: Supine wand AAROM flexion 1 lb bar 2 x 10  Added to HEP with updates and cues for activity.    Neuro Re-ed: (scapular control, muscle coordination,activation) Standing green band bilateral rows c scapular retraction focus 2 x 15 Standing green band GH ext 2 x 15 Standing isometric non pain increasing amount 5 sec on/off 90 seconds flexion, abduction, ER Lt arm.  Cues for techniques.    TherActivity: (to improve push/pull, elevation, reaching) UBE fwd/back 3 mins each way with :30 second rest break between directions.  Lvl 2.5  UE ranger flexion AAROM to elevation flexion 2 x 15 Lt arm with ipsilateral step in.   Vaso:  Medium compression to Lt shoulder for 10 minutes at 34deg    PATIENT  EDUCATION: Eval: Education details: HEP, POC, typical recovery from rotator cuff repair Person educated: Patient and Child(ren) Education method: Explanation, Demonstration, Verbal cues, and Handouts Education comprehension: verbalized understanding  HOME EXERCISE PROGRAM: Access Code: [email or web address] URL: [email or web address] Date: 04/28/2024 Prepared by: Corean Ku  Exercises - Seated Scapular Retraction  - 1 x daily - 7 x weekly - 3 sets - 10 reps - Supine Shoulder Flexion Extension AAROM with Dowel  - 2-3 x daily - 7 x weekly - 1-2 sets - 10-15 reps - 3 hold - Supine Shoulder Circles  - 2-3 x daily - 7 x weekly - 1-2 sets - 20-30 reps - Standing Isometric Shoulder External Rotation with Doorway  - 1-2 x daily - 7 x weekly - 1  sets - 10 reps - 5-10 hold - Standing Isometric Shoulder Flexion with Doorway - Arm Bent  - 1-2 x daily - 7 x weekly - 1 sets - 10 reps - 5-10 hold - Standing Isometric Shoulder Abduction with Doorway - Arm Bent  - 1-2 x daily - 7 x weekly - 1 sets - 10 reps - 5-10 hold  ASSESSMENT:  CLINICAL IMPRESSION:  Patient arrived to session noting intermittent shocking pains in Lt anterior shoulder. Patient tolerated all activities this date. Patient will continue to benefit from skilled PT.    OBJECTIVE IMPAIRMENTS: decreased activity tolerance, decreased coordination, decreased endurance, decreased mobility, decreased ROM, decreased strength, decreased safety awareness, hypomobility, increased edema, impaired flexibility, impaired sensation, impaired UE functional use, improper body mechanics, postural dysfunction, obesity, and pain.   ACTIVITY LIMITATIONS: carrying, lifting, sleeping, bed mobility, bathing, dressing, reach over head, and hygiene/grooming  PARTICIPATION LIMITATIONS: meal prep, cleaning, driving, community activity, and yard work  PERSONAL FACTORS: Past/current experiences, Time since onset of injury/illness/exacerbation, and 3+  comorbidities: HTN, hyperlipidemia, GERD, fibromyalgia, COPD, dyspnea, cataracts, chronic obstructive bronchitis, asthma, arthritis, history of sleep apnea are also affecting patient's functional outcome.   REHAB POTENTIAL: Good  CLINICAL DECISION MAKING: Evolving/moderate complexity  EVALUATION COMPLEXITY: Moderate  GOALS: Goals reviewed with patient? Yes  SHORT TERM GOALS: Target date: 04/24/2024  Patient will show compliance with initial HEP. Baseline: Goal status: MET 04/16/24  2.  Patient will report overall pain levels no greater than 5/10. Baseline:  Goal status: ongoing 04/16/24  3.  Patient will gain full PROM and AAROM of Lt shoulder to improve functional mobility. Baseline:  Goal status: ongoing 04/16/24  4.  Patient will increase PSFS to at least 3.8 to show a significant improvement to subjective disability rating.  Baseline:  Goal status: MET 04/16/24   LONG TERM GOALS: Target date: 06/12/2024  Patient will be independent with final HEP in order to maintain and progress upon functional gains made within PT. Baseline:  Goal status: ongoing 04/16/24  2.  Patient will report overall pain levels no greater than 2/10. Baseline:  Goal status: ongoing 04/16/24  3.   Patient will increase PSFS to at least 5.8 to show a significant improvement to subjective disability rating.  Baseline:  Goal status: ongoing 04/16/24  4.  Patient will return to full AROM of Lt shoulder in order to improve overall functional mobility.  Baseline:  Goal status: ongoing 04/16/24  5.  Patient will show strength in all motions in Lt shoulder to at least 4-/5 in order to show improved biomechanics with functional mobility.  Baseline:  Goal status: ongoing 04/16/24  6.  Patient will report improvement in overall sleep through the night in order to show improved overall quality of life. Baseline:  Goal status: ongoing 04/16/24  PLAN: PT FREQUENCY: 2-3x/wk for 6 weeks, 1-2x/wk for 6 weeks    PT DURATION: 12 weeks  PLANNED INTERVENTIONS: [id number]- PT Re-evaluation, 97750- Physical Performance Testing, 97110-Therapeutic exercises, 97530- Therapeutic activity, [id number]- Neuromuscular re-education, 97535- Self Care, [id number]- Manual therapy, [id number]- Gait training, [phone]- Canalith repositioning, [id number]- Electrical stimulation (unattended), [phone]- Electrical stimulation (manual), [id number]- Vasopneumatic device, [id number]- Ultrasound, [id number]- Traction (mechanical), [id number]- Ionotophoresis 4mg /ml Dexamethasone , 79439 (1-2 muscles), 20561 (3+ muscles)- Dry Needling, Patient/Family education, Balance training, Stair training, Taping, Joint mobilization, Joint manipulation, Spinal manipulation, Spinal mobilization, Scar mobilization, Vestibular training, DME instructions, Cryotherapy, and Moist heat  PLAN FOR NEXT SESSION:   AAROM to AROM progression, avoiding shrug - use gravity reduced positioning.  NEXT MD VISIT: 05/30/24     Susannah Daring, PT, DPT 05/07/24 11:02 AM

## 2024-05-07 ENCOUNTER — Ambulatory Visit (INDEPENDENT_AMBULATORY_CARE_PROVIDER_SITE_OTHER)

## 2024-05-07 DIAGNOSIS — R2689 Other abnormalities of gait and mobility: Secondary | ICD-10-CM

## 2024-05-07 DIAGNOSIS — M25512 Pain in left shoulder: Secondary | ICD-10-CM

## 2024-05-07 DIAGNOSIS — M62838 Other muscle spasm: Secondary | ICD-10-CM

## 2024-05-07 DIAGNOSIS — R600 Localized edema: Secondary | ICD-10-CM

## 2024-05-07 DIAGNOSIS — M25612 Stiffness of left shoulder, not elsewhere classified: Secondary | ICD-10-CM | POA: Diagnosis not present

## 2024-05-07 DIAGNOSIS — G8929 Other chronic pain: Secondary | ICD-10-CM

## 2024-05-07 DIAGNOSIS — M6281 Muscle weakness (generalized): Secondary | ICD-10-CM | POA: Diagnosis not present

## 2024-05-08 NOTE — Therapy (Signed)
 OUTPATIENT PHYSICAL THERAPY TREATMENT   Patient Name: Frances Nelson MRN: [id number] DOB:31-Jul-1955, 68 y.o., female Today's Date: 05/09/2024  END OF SESSION:  PT End of Session - 05/09/24 1018     Visit Number 17    Number of Visits 30    Date for Recertification  06/12/24    Authorization Type [employer] AND [employer] [place]    Progress Note Due on Visit 20    PT Start Time 1018    PT Stop Time 1056    PT Time Calculation (min) 38 min    Activity Tolerance Patient limited by pain    Behavior During Therapy [facility] for tasks assessed/performed            Past Medical History:  Diagnosis Date   Allergy 01.01.1975   Ambulates with cane    straight cane   Arthritis    Asthma    Bronchitis, obstructive, chronic (HCC)    hx   Cataracts, bilateral    pt has not had surgery to remove as of 02/15/24   Complication of anesthesia    patient states slow to wake up after anesthesia   COPD (chronic obstructive pulmonary disease) (HCC)    Dyspnea    only with exertion   Fibromyalgia    GERD (gastroesophageal reflux disease)    Headache    HLD (hyperlipidemia)    Hypertension    Pneumonia    x several   Past Surgical History:  Procedure Laterality Date   ABDOMINAL HYSTERECTOMY  8.19.1990   APPENDECTOMY  9.1.1980   BICEPT TENODESIS Left 02/21/2024   Procedure: TENODESIS, BICEPS;  Surgeon: Addie Cordella Hamilton, MD;  Location: [facility] OR;  Service: Orthopedics;  Laterality: Left;   CYST EXCISION     upper right side lateral - benign   HERNIA REPAIR     Umbilical x 3   KNEE ARTHROSCOPY W/ MENISCAL REPAIR Left 12/2022   POSTERIOR LUMBAR FUSION 2 WITH HARDWARE REMOVAL Left 02/21/2024   Procedure: ARTHROSCOPY, SHOULDER WITH DEBRIDEMENT;  Surgeon: Addie Cordella Hamilton, MD;  Location: [facility] OR;  Service: Orthopedics;  Laterality: Left;  left shoulder arthroscopy, debridement, biceps tenodesis, mini open rotator cuff tear repair   SHOULDER OPEN ROTATOR CUFF REPAIR Left 02/21/2024    Procedure: REPAIR, ROTATOR CUFF, OPEN;  Surgeon: Addie Cordella Hamilton, MD;  Location: [facility] OR;  Service: Orthopedics;  Laterality: Left;   SPINE SURGERY  05/2021   TONSILLECTOMY     TRANSFORAMINAL LUMBAR INTERBODY FUSION (TLIF) WITH PEDICLE SCREW FIXATION 1 LEVEL N/A 05/18/2022   Procedure: Lumbar Four-Five Open Laminectomy/Transforaminal Lumbar Interbody Fusion/Posterolateral fusion;  Surgeon: Cheryle Debby LABOR, MD;  Location: [facility] OR;  Service: Neurosurgery;  Laterality: N/A;   Patient Active Problem List   Diagnosis Date Noted   Nail disorder 04/05/2024   Synovitis of left shoulder 03/09/2024   Biceps tendonitis on left 03/09/2024   Degenerative superior labral anterior-to-posterior (SLAP) tear of left shoulder 03/09/2024   Complete tear of left rotator cuff 03/09/2024   Obesity 10/13/2023   Shingles outbreak 05/29/2023   Microhematuria 04/09/2023   Bilateral shoulder pain 04/09/2023   Urethritis 10/05/2022   External otitis of left ear 10/05/2022   Spondylolisthesis of lumbar region 05/18/2022   Smoker 04/08/2022   Deviated nasal septum 04/08/2022   Bilateral pain of leg and foot 04/08/2022   Follicular acne 04/06/2022   Baker cyst, left 04/06/2022   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   Depression 10/04/2021   Left knee pain  10/04/2021   Chronic pain 04/09/2021   Pelvic pain 04/09/2021   HLD (hyperlipidemia) 04/09/2021   Vitamin D  deficiency 04/09/2021   Estrogen deficiency 04/09/2021   Hyperglycemia 04/01/2021   Chronic cough 12/30/2020   GERD (gastroesophageal reflux disease) 11/06/2019   Bilateral leg edema 10/16/2019   Pleuritic chest pain 10/16/2019   Hypertension 10/02/2019   Arthralgia 06/28/2018   Sinusitis 06/28/2018   Renal cyst 05/03/2018   Syncope 05/03/2018   Stress and adjustment reaction 05/03/2018   History of tobacco use 05/02/2018   Primary osteoarthritis of both hands 04/10/2018   Primary osteoarthritis of both  knees 04/10/2018   Primary osteoarthritis of both feet 04/10/2018   Achilles tendinitis 04/02/2018   Screening for breast cancer 03/15/2018   Positive ANA (antinuclear antibody) 03/15/2018   Screening for colon cancer 03/15/2018   Rash 03/15/2018   Achilles tendon pain 11/13/2017   Cervical myelopathy with cervical radiculopathy (HCC) 11/13/2017   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   Allergic rhinitis 06/19/2017   Edema 12/24/2015   Carpal tunnel syndrome, right 12/16/2015   Hypercalcemia 11/11/2015   Pain in both upper extremities 11/11/2015   Drug reaction 10/28/2015   Muscle spasms of both lower extremities 10/28/2015   Nerve pain 10/28/2015   Chronic asthmatic bronchitis with acute exacerbation (HCC) 09/06/2015   Environmental and seasonal allergies 07/17/2014    PCP: Lynwood LELON Rush, MD   REFERRING PROVIDER: Cordella Glendia Hutchinson, MD   REFERRING DIAG: [phone] (ICD-10-CM) - S/P rotator cuff repair  THERAPY DIAG:  Chronic left shoulder pain  Stiffness of left shoulder, not elsewhere classified  Muscle weakness (generalized)  Localized edema  Other abnormalities of gait and mobility  Other muscle spasm  Rationale for Evaluation and Treatment: Rehabilitation  ONSET DATE: 02/21/2024 left shoulder arthroscopy with debridement biceps tenodesis rotator cuff tear repair with a 2 x 3 construct  SUBJECTIVE:                                                                                                                                                                                      SUBJECTIVE STATEMENT: Patient reports having a hard day with allergic reaction on feet and other comorbid difficulties, though improvement in shoulder pain secondary to lidocaine .    Hand dominance: Right  PERTINENT HISTORY: Patient endorsed having gone through PT and multiple shoulder injection prior to appointment with Dr. Hutchinson, who scheduled and performed her surgery. Patient notes that her  original pain started in Feb 2025, though did have a fall down stairs Feb 2024 and is not sure if that is when she injured shoulder as she did not have pain at that time. Patient  used CPM daily and wears sling only in public at this time, but is working on weaning from both. Patient endorses difficulty with sleeping and can average 5 hours per night when not in so much pain. Patient has undergone knee surgery (2023), back surgery (2024), and also currently has bilat carpal tunnel with n/t in fingers and hands.  PAIN:   NPRS scale: 4/10 Pain location: Lt shoulder/arm Pain description: all pain descriptors, but most of the time achey Aggravating factors: moving a certain way, reaching, trying to do hair, picking something up   Relieving factors: tylenol  arthritis twice a day, tramadol  as needed (mid morning, before bed), muscle relaxer, arnica herb (gel for bruising and pain), lidocaine  patches, ice    PRECAUTIONS: Shoulder  RED FLAGS: None   WEIGHT BEARING RESTRICTIONS: Yes Lt shoulder   FALLS:  Has patient fallen in last 6 months? Yes. Number of falls 1, slipped in the bath tub   LIVING ENVIRONMENT: Lives with: lives with their family and lives with their son Lives in: House/apartment Stairs: Yes: Internal: 15 steps; on left going up and External: 3 steps; none Has following equipment at home: Single point cane, Quad cane small base, Walker - 2 wheeled, shower chair, Shower bench, and Grab bars  OCCUPATION: Retired; Print production planner   PLOF: Independent and Independent with community mobility with device  PATIENT GOALS: to be able to use my arm again   OBJECTIVE:  Note: Objective measures were completed at Evaluation unless otherwise noted.  DIAGNOSTIC FINDINGS:    PATIENT SURVEYS :  PSFS: THE PATIENT SPECIFIC FUNCTIONAL SCALE  Place score of 0-10 (0 = unable to perform activity and 10 = able to perform activity at the same level as before injury or problem)  Activity Date:  03/20/2024  04/16/24   Get dressed and putting shoes on  4 4   2. Brush and style your hair / showering  1 5   3. Cook  2 6   4. Washing dishes  1  4   5. Clean, sweep, mop  1 2   Total Score 1.8 4.2     Total Score = Sum of activity scores/number of activities  Minimally Detectable Change: 3 points (for single activity); 2 points (for average score)  [facility] (nd). The Patient Specific Functional Scale . Retrieved from [email or web address]   COGNITION: 03/20/2024 Overall cognitive status: Within functional limits for tasks assessed     SENSATION: 03/20/2024 Light touch: diminished in fingers secondary to carpal tunnel Carpal tunnel leading to n/t in fingers;   POSTURE: 03/20/2024 Increased thoracic kyphosis, forward head, and forward rounded shoulders   UPPER EXTREMITY ROM:    ROM Right Eval 03/20/2024 Left Eval 03/20/2024 Left 04/04/24 Left 04/16/24  Shoulder flexion WFL 85 AAROM, highly painful A: 15  (supine) A: 112  (sitting) 140 (supine) P: 147  Shoulder extension      Shoulder abduction WFL 60 AAROM, highly painful  A: 85  (sitting) 101 (supine) P: 109  Shoulder adduction      Shoulder internal rotation T8 75 AAROM at 45deg abd in supine  P: 85 in 60 deg abdct  Shoulder external rotation T1 23 AAROM at 45deg abd in supine  P: 60 in 60 deg abdct  (Blank rows = not tested)  UPPER EXTREMITY MMT:  03/20/2024 unable to perform on eval due to protocol restrictions  MMT Right Eval 03/20/2024 Left Eval 03/20/2024 Right 04/24/2024 Left 04/24/2024  Shoulder flexion    2/5  c pain Unable to lift full range against gravity.   Shoulder extension      Shoulder abduction      Shoulder adduction      Shoulder internal rotation      Shoulder external rotation      Middle trapezius      Lower trapezius      Elbow flexion      Elbow extension      Wrist flexion      Wrist extension      Wrist ulnar deviation       Wrist radial deviation      Wrist pronation      Wrist supination      Grip strength (lbs)      (Blank rows = not tested)  SHOULDER SPECIAL TESTS: 03/20/2024 Did not perform on eval   JOINT MOBILITY TESTING:  03/20/2024 Not assessed on eval   PALPATION:  03/20/2024 Not assessed on eval                   TREATMENT 05/09/2024 TherAct:  UBE level 2.5 for 4 min fwd/4 min back   Wall ladder into shoulder abduction x10  Wall ladder into shoulder flexion x10  Shoulder flexion stretch 1x8 Wall circles with pink ball, elbow extended and shoulder at 90deg; 1x30s CW and CCW  Ball hold at 90deg shoulder flexion 2x30s  Seated shoulder flexion with 2# bar 1x20  Seated shoulder abduction with 2# bar 1x15  05/07/2024 TherAct:  UBE level 2.5 for 4 min fwd/4 min back   TherEx: UE ranger into shoulder flexion and scaption 1x20 each  Wall circles with pink ball, elbow extended and shoulder at 90deg; 2x30s CW and CCW  Seated shoulder flexion 2x10 with 2# bar  Seated abduction 2x12 with 2# bar   04/30/2024 TherAct:  UBE level 2.5 4 min fwd/4 min back  Clean the mirror exercise into shoulder flexion 2x8 with 3s hold  Clean the mirror in shoulder flexion then Rt to Lt 2x3 each direction  Seated flexion with 1# bar 1x10, 1x20  Seated abduction with 1# bar 2x6  04/28/24 TherAct UE ranger flexion, scaption 2x10 Pulleys flexion and scaption x 3 min each UBE L2 x 6:30 (3 min each direction with 30 sec rest between) Standing isometrics 10 x 5 sec hold each:  Flexion Abduction ER Seated flexion with 1# bar 2x10 Seated abduction with 1# bar AA x10 reps     PATIENT EDUCATION: Eval: Education details: HEP, POC, typical recovery from rotator cuff repair Person educated: Patient and Child(ren) Education method: Explanation, Demonstration, Verbal cues, and Handouts Education comprehension: verbalized understanding  HOME EXERCISE PROGRAM: Access Code: [email or web address] URL:  [email or web address] Date: 04/28/2024 Prepared by: Corean Ku  Exercises - Seated Scapular Retraction  - 1 x daily - 7 x weekly - 3 sets - 10 reps - Supine Shoulder Flexion Extension AAROM with Dowel  - 2-3 x daily - 7 x weekly - 1-2 sets - 10-15 reps - 3 hold - Supine Shoulder Circles  - 2-3 x daily - 7 x weekly - 1-2 sets - 20-30 reps - Standing Isometric Shoulder External Rotation with Doorway  - 1-2 x daily - 7 x weekly - 1 sets - 10 reps - 5-10 hold - Standing Isometric Shoulder Flexion with Doorway - Arm Bent  - 1-2 x daily - 7 x weekly - 1 sets - 10 reps - 5-10 hold - Standing Isometric Shoulder Abduction with Doorway - Arm Bent  -  1-2 x daily - 7 x weekly - 1 sets - 10 reps - 5-10 hold  ASSESSMENT:  CLINICAL IMPRESSION:  Patient arrived to session noting no change in symptoms, though having difficulties secondary to other comorbidities. Patient tolerated all activities this date. Patient will continue to benefit from skilled PT.    OBJECTIVE IMPAIRMENTS: decreased activity tolerance, decreased coordination, decreased endurance, decreased mobility, decreased ROM, decreased strength, decreased safety awareness, hypomobility, increased edema, impaired flexibility, impaired sensation, impaired UE functional use, improper body mechanics, postural dysfunction, obesity, and pain.   ACTIVITY LIMITATIONS: carrying, lifting, sleeping, bed mobility, bathing, dressing, reach over head, and hygiene/grooming  PARTICIPATION LIMITATIONS: meal prep, cleaning, driving, community activity, and yard work  PERSONAL FACTORS: Past/current experiences, Time since onset of injury/illness/exacerbation, and 3+ comorbidities: HTN, hyperlipidemia, GERD, fibromyalgia, COPD, dyspnea, cataracts, chronic obstructive bronchitis, asthma, arthritis, history of sleep apnea are also affecting patient's functional outcome.   REHAB POTENTIAL: Good  CLINICAL DECISION MAKING: Evolving/moderate  complexity  EVALUATION COMPLEXITY: Moderate  GOALS: Goals reviewed with patient? Yes  SHORT TERM GOALS: Target date: 04/24/2024  Patient will show compliance with initial HEP. Baseline: Goal status: MET 04/16/24  2.  Patient will report overall pain levels no greater than 5/10. Baseline:  Goal status: ongoing 04/16/24  3.  Patient will gain full PROM and AAROM of Lt shoulder to improve functional mobility. Baseline:  Goal status: ongoing 04/16/24  4.  Patient will increase PSFS to at least 3.8 to show a significant improvement to subjective disability rating.  Baseline:  Goal status: MET 04/16/24   LONG TERM GOALS: Target date: 06/12/2024  Patient will be independent with final HEP in order to maintain and progress upon functional gains made within PT. Baseline:  Goal status: ongoing 04/16/24  2.  Patient will report overall pain levels no greater than 2/10. Baseline:  Goal status: ongoing 04/16/24  3.   Patient will increase PSFS to at least 5.8 to show a significant improvement to subjective disability rating.  Baseline:  Goal status: ongoing 04/16/24  4.  Patient will return to full AROM of Lt shoulder in order to improve overall functional mobility.  Baseline:  Goal status: ongoing 04/16/24  5.  Patient will show strength in all motions in Lt shoulder to at least 4-/5 in order to show improved biomechanics with functional mobility.  Baseline:  Goal status: ongoing 04/16/24  6.  Patient will report improvement in overall sleep through the night in order to show improved overall quality of life. Baseline:  Goal status: ongoing 04/16/24  PLAN: PT FREQUENCY: 2-3x/wk for 6 weeks, 1-2x/wk for 6 weeks   PT DURATION: 12 weeks  PLANNED INTERVENTIONS: 97164- PT Re-evaluation, 97750- Physical Performance Testing, 97110-Therapeutic exercises, 97530- Therapeutic activity, [id number]- Neuromuscular re-education, 97535- Self Care, [id number]- Manual therapy, [id number]- Gait training, [phone]-  Canalith repositioning, H9716- Electrical stimulation (unattended), [phone]- Electrical stimulation (manual), [id number]- Vasopneumatic device, [id number]- Ultrasound, [id number]- Traction (mechanical), [id number]- Ionotophoresis 4mg /ml Dexamethasone , 79439 (1-2 muscles), 20561 (3+ muscles)- Dry Needling, Patient/Family education, Balance training, Stair training, Taping, Joint mobilization, Joint manipulation, Spinal manipulation, Spinal mobilization, Scar mobilization, Vestibular training, DME instructions, Cryotherapy, and Moist heat  PLAN FOR NEXT SESSION:    AAROM to AROM progression, avoiding shrug - use gravity reduced positioning.    NEXT MD VISIT: 05/30/24     Susannah Daring, PT, DPT 05/09/24 11:01 AM

## 2024-05-09 ENCOUNTER — Ambulatory Visit (INDEPENDENT_AMBULATORY_CARE_PROVIDER_SITE_OTHER)

## 2024-05-09 DIAGNOSIS — M62838 Other muscle spasm: Secondary | ICD-10-CM

## 2024-05-09 DIAGNOSIS — R600 Localized edema: Secondary | ICD-10-CM | POA: Diagnosis not present

## 2024-05-09 DIAGNOSIS — M6281 Muscle weakness (generalized): Secondary | ICD-10-CM

## 2024-05-09 DIAGNOSIS — M25512 Pain in left shoulder: Secondary | ICD-10-CM

## 2024-05-09 DIAGNOSIS — R2689 Other abnormalities of gait and mobility: Secondary | ICD-10-CM

## 2024-05-09 DIAGNOSIS — G8929 Other chronic pain: Secondary | ICD-10-CM

## 2024-05-09 DIAGNOSIS — M25612 Stiffness of left shoulder, not elsewhere classified: Secondary | ICD-10-CM

## 2024-05-09 NOTE — Addendum Note (Signed)
 Addended by: GIB ANTES R on: 05/09/2024 12:56 PM   Modules accepted: Orders

## 2024-05-12 ENCOUNTER — Ambulatory Visit: Payer: Self-pay | Admitting: Internal Medicine

## 2024-05-12 ENCOUNTER — Ambulatory Visit (HOSPITAL_COMMUNITY)
Admission: RE | Admit: 2024-05-12 | Discharge: 2024-05-12 | Disposition: A | Discharge status: Home / Self Care | Source: Ambulatory Visit | Attending: Cardiology | Admitting: Cardiology

## 2024-05-12 DIAGNOSIS — I5033 Acute on chronic diastolic (congestive) heart failure: Secondary | ICD-10-CM | POA: Insufficient documentation

## 2024-05-12 DIAGNOSIS — R609 Edema, unspecified: Secondary | ICD-10-CM | POA: Insufficient documentation

## 2024-05-12 LAB — ECHOCARDIOGRAM COMPLETE
Area-P 1/2: 4.15 cm2
S' Lateral: 3 cm

## 2024-05-12 NOTE — Therapy (Signed)
 OUTPATIENT PHYSICAL THERAPY TREATMENT   Patient Name: Frances Nelson MRN: [id number] DOB:1956/04/02, 68 y.o., female Today's Date: 05/13/2024  END OF SESSION:  PT End of Session - 05/13/24 1018     Visit Number 18    Number of Visits 30    Date for Recertification  06/12/24    Authorization Type [employer] AND [employer] [place]    Progress Note Due on Visit 20    PT Start Time 1018    PT Stop Time 1058    PT Time Calculation (min) 40 min    Activity Tolerance Patient limited by pain    Behavior During Therapy [facility] for tasks assessed/performed             Past Medical History:  Diagnosis Date   Allergy 01.01.1975   Ambulates with cane    straight cane   Arthritis    Asthma    Bronchitis, obstructive, chronic (HCC)    hx   Cataracts, bilateral    pt has not had surgery to remove as of 02/15/24   Complication of anesthesia    patient states slow to wake up after anesthesia   COPD (chronic obstructive pulmonary disease) (HCC)    Dyspnea    only with exertion   Fibromyalgia    GERD (gastroesophageal reflux disease)    Headache    HLD (hyperlipidemia)    Hypertension    Pneumonia    x several   Past Surgical History:  Procedure Laterality Date   ABDOMINAL HYSTERECTOMY  8.19.1990   APPENDECTOMY  9.1.1980   BICEPT TENODESIS Left 02/21/2024   Procedure: TENODESIS, BICEPS;  Surgeon: Addie Cordella Hamilton, MD;  Location: [facility] OR;  Service: Orthopedics;  Laterality: Left;   CYST EXCISION     upper right side lateral - benign   HERNIA REPAIR     Umbilical x 3   KNEE ARTHROSCOPY W/ MENISCAL REPAIR Left 12/2022   POSTERIOR LUMBAR FUSION 2 WITH HARDWARE REMOVAL Left 02/21/2024   Procedure: ARTHROSCOPY, SHOULDER WITH DEBRIDEMENT;  Surgeon: Addie Cordella Hamilton, MD;  Location: [facility] OR;  Service: Orthopedics;  Laterality: Left;  left shoulder arthroscopy, debridement, biceps tenodesis, mini open rotator cuff tear repair   SHOULDER OPEN ROTATOR CUFF REPAIR Left 02/21/2024    Procedure: REPAIR, ROTATOR CUFF, OPEN;  Surgeon: Addie Cordella Hamilton, MD;  Location: [facility] OR;  Service: Orthopedics;  Laterality: Left;   SPINE SURGERY  05/2021   TONSILLECTOMY     TRANSFORAMINAL LUMBAR INTERBODY FUSION (TLIF) WITH PEDICLE SCREW FIXATION 1 LEVEL N/A 05/18/2022   Procedure: Lumbar Four-Five Open Laminectomy/Transforaminal Lumbar Interbody Fusion/Posterolateral fusion;  Surgeon: Cheryle Debby LABOR, MD;  Location: [facility] OR;  Service: Neurosurgery;  Laterality: N/A;   Patient Active Problem List   Diagnosis Date Noted   Nail disorder 04/05/2024   Synovitis of left shoulder 03/09/2024   Biceps tendonitis on left 03/09/2024   Degenerative superior labral anterior-to-posterior (SLAP) tear of left shoulder 03/09/2024   Complete tear of left rotator cuff 03/09/2024   Obesity 10/13/2023   Shingles outbreak 05/29/2023   Microhematuria 04/09/2023   Bilateral shoulder pain 04/09/2023   Urethritis 10/05/2022   External otitis of left ear 10/05/2022   Spondylolisthesis of lumbar region 05/18/2022   Smoker 04/08/2022   Deviated nasal septum 04/08/2022   Bilateral pain of leg and foot 04/08/2022   Follicular acne 04/06/2022   Baker cyst, left 04/06/2022   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   Depression 10/04/2021   Left knee  pain 10/04/2021   Chronic pain 04/09/2021   Pelvic pain 04/09/2021   HLD (hyperlipidemia) 04/09/2021   Vitamin D  deficiency 04/09/2021   Estrogen deficiency 04/09/2021   Hyperglycemia 04/01/2021   Chronic cough 12/30/2020   GERD (gastroesophageal reflux disease) 11/06/2019   Bilateral leg edema 10/16/2019   Pleuritic chest pain 10/16/2019   Hypertension 10/02/2019   Arthralgia 06/28/2018   Sinusitis 06/28/2018   Renal cyst 05/03/2018   Syncope 05/03/2018   Stress and adjustment reaction 05/03/2018   History of tobacco use 05/02/2018   Primary osteoarthritis of both hands 04/10/2018   Primary osteoarthritis of both  knees 04/10/2018   Primary osteoarthritis of both feet 04/10/2018   Achilles tendinitis 04/02/2018   Screening for breast cancer 03/15/2018   Positive ANA (antinuclear antibody) 03/15/2018   Screening for colon cancer 03/15/2018   Rash 03/15/2018   Achilles tendon pain 11/13/2017   Cervical myelopathy with cervical radiculopathy (HCC) 11/13/2017   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   Allergic rhinitis 06/19/2017   Edema 12/24/2015   Carpal tunnel syndrome, right 12/16/2015   Hypercalcemia 11/11/2015   Pain in both upper extremities 11/11/2015   Drug reaction 10/28/2015   Muscle spasms of both lower extremities 10/28/2015   Nerve pain 10/28/2015   Chronic asthmatic bronchitis with acute exacerbation (HCC) 09/06/2015   Environmental and seasonal allergies 07/17/2014    PCP: Lynwood LELON Rush, MD   REFERRING PROVIDER: Cordella Glendia Hutchinson, MD   REFERRING DIAG: [phone] (ICD-10-CM) - S/P rotator cuff repair  THERAPY DIAG:  Chronic left shoulder pain  Stiffness of left shoulder, not elsewhere classified  Muscle weakness (generalized)  Localized edema  Other abnormalities of gait and mobility  Other muscle spasm  Rationale for Evaluation and Treatment: Rehabilitation  ONSET DATE: 02/21/2024 left shoulder arthroscopy with debridement biceps tenodesis rotator cuff tear repair with a 2 x 3 construct  SUBJECTIVE:                                                                                                                                                                                      SUBJECTIVE STATEMENT: Patient endorsing increase in shooting electrical pains in Lt LE that kept her from sleeping over night.    Hand dominance: Right  PERTINENT HISTORY: Patient endorsed having gone through PT and multiple shoulder injection prior to appointment with Dr. Hutchinson, who scheduled and performed her surgery. Patient notes that her original pain started in Feb 2025, though did have a  fall down stairs Feb 2024 and is not sure if that is when she injured shoulder as she did not have pain at that time. Patient used CPM daily and wears  sling only in public at this time, but is working on weaning from both. Patient endorses difficulty with sleeping and can average 5 hours per night when not in so much pain. Patient has undergone knee surgery (2023), back surgery (2024), and also currently has bilat carpal tunnel with n/t in fingers and hands.  PAIN:   NPRS scale: 4/10 Pain location: Lt shoulder/arm Pain description: all pain descriptors, but most of the time achey Aggravating factors: moving a certain way, reaching, trying to do hair, picking something up   Relieving factors: tylenol  arthritis twice a day, tramadol  as needed (mid morning, before bed), muscle relaxer, arnica herb (gel for bruising and pain), lidocaine  patches, ice    PRECAUTIONS: Shoulder  RED FLAGS: None   WEIGHT BEARING RESTRICTIONS: Yes Lt shoulder   FALLS:  Has patient fallen in last 6 months? Yes. Number of falls 1, slipped in the bath tub   LIVING ENVIRONMENT: Lives with: lives with their family and lives with their son Lives in: House/apartment Stairs: Yes: Internal: 15 steps; on left going up and External: 3 steps; none Has following equipment at home: Single point cane, Quad cane small base, Walker - 2 wheeled, shower chair, Shower bench, and Grab bars  OCCUPATION: Retired; print production planner   PLOF: Independent and Independent with community mobility with device  PATIENT GOALS: to be able to use my arm again   OBJECTIVE:  Note: Objective measures were completed at Evaluation unless otherwise noted.  DIAGNOSTIC FINDINGS:    PATIENT SURVEYS :  PSFS: THE PATIENT SPECIFIC FUNCTIONAL SCALE  Place score of 0-10 (0 = unable to perform activity and 10 = able to perform activity at the same level as before injury or problem)  Activity Date: 03/20/2024  04/16/24   Get dressed and putting shoes on   4 4   2. Brush and style your hair / showering  1 5   3. Cook  2 6   4. Washing dishes  1  4   5. Clean, sweep, mop  1 2   Total Score 1.8 4.2     Total Score = Sum of activity scores/number of activities  Minimally Detectable Change: 3 points (for single activity); 2 points (for average score)  [facility] (nd). The Patient Specific Functional Scale . Retrieved from [email or web address]   COGNITION: 03/20/2024 Overall cognitive status: Within functional limits for tasks assessed     SENSATION: 03/20/2024 Light touch: diminished in fingers secondary to carpal tunnel Carpal tunnel leading to n/t in fingers;   POSTURE: 03/20/2024 Increased thoracic kyphosis, forward head, and forward rounded shoulders   UPPER EXTREMITY ROM:    ROM Right Eval 03/20/2024 Left Eval 03/20/2024 Left 04/04/24 Left 04/16/24  Shoulder flexion WFL 85 AAROM, highly painful A: 15  (supine) A: 112  (sitting) 140 (supine) P: 147  Shoulder extension      Shoulder abduction WFL 60 AAROM, highly painful  A: 85  (sitting) 101 (supine) P: 109  Shoulder adduction      Shoulder internal rotation T8 75 AAROM at 45deg abd in supine  P: 85 in 60 deg abdct  Shoulder external rotation T1 23 AAROM at 45deg abd in supine  P: 60 in 60 deg abdct  (Blank rows = not tested)  UPPER EXTREMITY MMT:  03/20/2024 unable to perform on eval due to protocol restrictions  MMT Right Eval 03/20/2024 Left Eval 03/20/2024 Right 04/24/2024 Left 04/24/2024  Shoulder flexion    2/5 c pain Unable to lift  full range against gravity.   Shoulder extension      Shoulder abduction      Shoulder adduction      Shoulder internal rotation      Shoulder external rotation      Middle trapezius      Lower trapezius      Elbow flexion      Elbow extension      Wrist flexion      Wrist extension      Wrist ulnar deviation      Wrist radial deviation      Wrist pronation       Wrist supination      Grip strength (lbs)      (Blank rows = not tested)  SHOULDER SPECIAL TESTS: 03/20/2024 Did not perform on eval   JOINT MOBILITY TESTING:  03/20/2024 Not assessed on eval   PALPATION:  03/20/2024 Not assessed on eval                   TREATMENT 05/13/2024 TherAct:  Pulleys 3 min shoulder flexion, 3 min shoulder abduction  Wall ladder into shoulder abduction x10  Wall ladder into shoulder flexion x10   TherEx:  Standing shoulder flexion with 1# DB 2x12  Standing shoulder abduction 1x10 no weight, 2x8 with 1# DB  Standing ball hold at 90deg shoulder flexion 2x30s  Standing ball hold at 90deg shoulder abduction 2x30s   05/09/2024 TherAct:  UBE level 2.5 for 4 min fwd/4 min back   Wall ladder into shoulder abduction x10  Wall ladder into shoulder flexion x10  Shoulder flexion stretch 1x8 Wall circles with pink ball, elbow extended and shoulder at 90deg; 1x30s CW and CCW  Ball hold at 90deg shoulder flexion 2x30s  Seated shoulder flexion with 2# bar 1x20  Seated shoulder abduction with 2# bar 1x15  05/07/2024 TherAct:  UBE level 2.5 for 4 min fwd/4 min back   TherEx: UE ranger into shoulder flexion and scaption 1x20 each  Wall circles with pink ball, elbow extended and shoulder at 90deg; 2x30s CW and CCW  Seated shoulder flexion 2x10 with 2# bar  Seated abduction 2x12 with 2# bar   04/30/2024 TherAct:  UBE level 2.5 4 min fwd/4 min back  Clean the mirror exercise into shoulder flexion 2x8 with 3s hold  Clean the mirror in shoulder flexion then Rt to Lt 2x3 each direction  Seated flexion with 1# bar 1x10, 1x20  Seated abduction with 1# bar 2x6  04/28/24 TherAct UE ranger flexion, scaption 2x10 Pulleys flexion and scaption x 3 min each UBE L2 x 6:30 (3 min each direction with 30 sec rest between) Standing isometrics 10 x 5 sec hold each:  Flexion Abduction ER Seated flexion with 1# bar 2x10 Seated abduction with 1# bar AA x10  reps     PATIENT EDUCATION: Eval: Education details: HEP, POC, typical recovery from rotator cuff repair Person educated: Patient and Child(ren) Education method: Explanation, Demonstration, Verbal cues, and Handouts Education comprehension: verbalized understanding  HOME EXERCISE PROGRAM: Access Code: [email or web address] URL: [email or web address] Date: 04/28/2024 Prepared by: Corean Ku  Exercises - Seated Scapular Retraction  - 1 x daily - 7 x weekly - 3 sets - 10 reps - Supine Shoulder Flexion Extension AAROM with Dowel  - 2-3 x daily - 7 x weekly - 1-2 sets - 10-15 reps - 3 hold - Supine Shoulder Circles  - 2-3 x daily - 7 x weekly - 1-2 sets - 20-30 reps - Standing  Isometric Shoulder External Rotation with Doorway  - 1-2 x daily - 7 x weekly - 1 sets - 10 reps - 5-10 hold - Standing Isometric Shoulder Flexion with Doorway - Arm Bent  - 1-2 x daily - 7 x weekly - 1 sets - 10 reps - 5-10 hold - Standing Isometric Shoulder Abduction with Doorway - Arm Bent  - 1-2 x daily - 7 x weekly - 1 sets - 10 reps - 5-10 hold  ASSESSMENT:  CLINICAL IMPRESSION:  Patient arrived to session noting increased electrical shock pains in Lt LE and increased soreness in Lt shoulder. Patient tolerated all activities this date with only intermittent increases in soreness. Patient will continue to benefit from skilled PT.    OBJECTIVE IMPAIRMENTS: decreased activity tolerance, decreased coordination, decreased endurance, decreased mobility, decreased ROM, decreased strength, decreased safety awareness, hypomobility, increased edema, impaired flexibility, impaired sensation, impaired UE functional use, improper body mechanics, postural dysfunction, obesity, and pain.   ACTIVITY LIMITATIONS: carrying, lifting, sleeping, bed mobility, bathing, dressing, reach over head, and hygiene/grooming  PARTICIPATION LIMITATIONS: meal prep, cleaning, driving, community activity, and yard work  PERSONAL  FACTORS: Past/current experiences, Time since onset of injury/illness/exacerbation, and 3+ comorbidities: HTN, hyperlipidemia, GERD, fibromyalgia, COPD, dyspnea, cataracts, chronic obstructive bronchitis, asthma, arthritis, history of sleep apnea are also affecting patient's functional outcome.   REHAB POTENTIAL: Good  CLINICAL DECISION MAKING: Evolving/moderate complexity  EVALUATION COMPLEXITY: Moderate  GOALS: Goals reviewed with patient? Yes  SHORT TERM GOALS: Target date: 04/24/2024  Patient will show compliance with initial HEP. Baseline: Goal status: MET 04/16/24  2.  Patient will report overall pain levels no greater than 5/10. Baseline:  Goal status: ongoing 04/16/24  3.  Patient will gain full PROM and AAROM of Lt shoulder to improve functional mobility. Baseline:  Goal status: ongoing 04/16/24  4.  Patient will increase PSFS to at least 3.8 to show a significant improvement to subjective disability rating.  Baseline:  Goal status: MET 04/16/24   LONG TERM GOALS: Target date: 06/12/2024  Patient will be independent with final HEP in order to maintain and progress upon functional gains made within PT. Baseline:  Goal status: ongoing 04/16/24  2.  Patient will report overall pain levels no greater than 2/10. Baseline:  Goal status: ongoing 04/16/24  3.   Patient will increase PSFS to at least 5.8 to show a significant improvement to subjective disability rating.  Baseline:  Goal status: ongoing 04/16/24  4.  Patient will return to full AROM of Lt shoulder in order to improve overall functional mobility.  Baseline:  Goal status: ongoing 04/16/24  5.  Patient will show strength in all motions in Lt shoulder to at least 4-/5 in order to show improved biomechanics with functional mobility.  Baseline:  Goal status: ongoing 04/16/24  6.  Patient will report improvement in overall sleep through the night in order to show improved overall quality of life. Baseline:  Goal  status: ongoing 04/16/24  PLAN: PT FREQUENCY: 2-3x/wk for 6 weeks, 1-2x/wk for 6 weeks   PT DURATION: 12 weeks  PLANNED INTERVENTIONS: [id number]- PT Re-evaluation, 97750- Physical Performance Testing, 97110-Therapeutic exercises, 97530- Therapeutic activity, [id number]- Neuromuscular re-education, 97535- Self Care, [id number]- Manual therapy, [id number]- Gait training, [phone]- Canalith repositioning, [id number]- Electrical stimulation (unattended), [phone]- Electrical stimulation (manual), [id number]- Vasopneumatic device, [id number]- Ultrasound, [id number]- Traction (mechanical), [id number]- Ionotophoresis 4mg /ml Dexamethasone , [id number] (1-2 muscles), [id number] (3+ muscles)- Dry Needling, Patient/Family education, Balance training, Stair training, Taping, Joint mobilization, Joint manipulation, Spinal manipulation, Spinal mobilization, Scar  mobilization, Vestibular training, DME instructions, Cryotherapy, and Moist heat  PLAN FOR NEXT SESSION:     AAROM to AROM progression, avoiding shrug - use gravity reduced positioning.    NEXT MD VISIT: 05/30/24     Susannah Daring, PT, DPT 05/13/24 11:02 AM

## 2024-05-13 ENCOUNTER — Ambulatory Visit

## 2024-05-13 DIAGNOSIS — M6281 Muscle weakness (generalized): Secondary | ICD-10-CM | POA: Diagnosis not present

## 2024-05-13 DIAGNOSIS — M25612 Stiffness of left shoulder, not elsewhere classified: Secondary | ICD-10-CM

## 2024-05-13 DIAGNOSIS — M25512 Pain in left shoulder: Secondary | ICD-10-CM | POA: Diagnosis not present

## 2024-05-13 DIAGNOSIS — R600 Localized edema: Secondary | ICD-10-CM

## 2024-05-13 DIAGNOSIS — R2689 Other abnormalities of gait and mobility: Secondary | ICD-10-CM

## 2024-05-13 DIAGNOSIS — M62838 Other muscle spasm: Secondary | ICD-10-CM

## 2024-05-13 DIAGNOSIS — G8929 Other chronic pain: Secondary | ICD-10-CM

## 2024-05-15 NOTE — Therapy (Signed)
 OUTPATIENT PHYSICAL THERAPY TREATMENT   Patient Name: Frances Nelson MRN: [id number] DOB:[birth year], 68 y.o., female Today's Date: 05/16/2024  END OF SESSION:  PT End of Session - 05/16/24 1019     Visit Number 19    Number of Visits 30    Date for Recertification  06/12/24    Authorization Type [employer] AND [employer] [place]    Progress Note Due on Visit 20    PT Start Time 1019    PT Stop Time 1057    PT Time Calculation (min) 38 min    Activity Tolerance Patient limited by pain    Behavior During Therapy [facility] for tasks assessed/performed              Past Medical History:  Diagnosis Date   Allergy 01.01.1975   Ambulates with cane    straight cane   Arthritis    Asthma    Bronchitis, obstructive, chronic (HCC)    hx   Cataracts, bilateral    pt has not had surgery to remove as of 02/15/24   Complication of anesthesia    patient states slow to wake up after anesthesia   COPD (chronic obstructive pulmonary disease) (HCC)    Dyspnea    only with exertion   Fibromyalgia    GERD (gastroesophageal reflux disease)    Headache    HLD (hyperlipidemia)    Hypertension    Pneumonia    x several   Past Surgical History:  Procedure Laterality Date   ABDOMINAL HYSTERECTOMY  8.19.1990   APPENDECTOMY  9.1.1980   BICEPT TENODESIS Left 02/21/2024   Procedure: TENODESIS, BICEPS;  Surgeon: Addie Cordella Hamilton, MD;  Location: [facility] OR;  Service: Orthopedics;  Laterality: Left;   CYST EXCISION     upper right side lateral - benign   HERNIA REPAIR     Umbilical x 3   KNEE ARTHROSCOPY W/ MENISCAL REPAIR Left 12/2022   POSTERIOR LUMBAR FUSION 2 WITH HARDWARE REMOVAL Left 02/21/2024   Procedure: ARTHROSCOPY, SHOULDER WITH DEBRIDEMENT;  Surgeon: Addie Cordella Hamilton, MD;  Location: [facility] OR;  Service: Orthopedics;  Laterality: Left;  left shoulder arthroscopy, debridement, biceps tenodesis, mini open rotator cuff tear repair   SHOULDER OPEN ROTATOR CUFF REPAIR Left 02/21/2024    Procedure: REPAIR, ROTATOR CUFF, OPEN;  Surgeon: Addie Cordella Hamilton, MD;  Location: [facility] OR;  Service: Orthopedics;  Laterality: Left;   SPINE SURGERY  05/2021   TONSILLECTOMY     TRANSFORAMINAL LUMBAR INTERBODY FUSION (TLIF) WITH PEDICLE SCREW FIXATION 1 LEVEL N/A 05/18/2022   Procedure: Lumbar Four-Five Open Laminectomy/Transforaminal Lumbar Interbody Fusion/Posterolateral fusion;  Surgeon: Cheryle Debby LABOR, MD;  Location: [facility] OR;  Service: Neurosurgery;  Laterality: N/A;   Patient Active Problem List   Diagnosis Date Noted   Nail disorder 04/05/2024   Synovitis of left shoulder 03/09/2024   Biceps tendonitis on left 03/09/2024   Degenerative superior labral anterior-to-posterior (SLAP) tear of left shoulder 03/09/2024   Complete tear of left rotator cuff 03/09/2024   Obesity 10/13/2023   Shingles outbreak 05/29/2023   Microhematuria 04/09/2023   Bilateral shoulder pain 04/09/2023   Urethritis 10/05/2022   External otitis of left ear 10/05/2022   Spondylolisthesis of lumbar region 05/18/2022   Smoker 04/08/2022   Deviated nasal septum 04/08/2022   Bilateral pain of leg and foot 04/08/2022   Follicular acne 04/06/2022   Baker cyst, left 04/06/2022   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   Depression 10/04/2021   Left  knee pain 10/04/2021   Chronic pain 04/09/2021   Pelvic pain 04/09/2021   HLD (hyperlipidemia) 04/09/2021   Vitamin D  deficiency 04/09/2021   Estrogen deficiency 04/09/2021   Hyperglycemia 04/01/2021   Chronic cough 12/30/2020   GERD (gastroesophageal reflux disease) 11/06/2019   Bilateral leg edema 10/16/2019   Pleuritic chest pain 10/16/2019   Hypertension 10/02/2019   Arthralgia 06/28/2018   Sinusitis 06/28/2018   Renal cyst 05/03/2018   Syncope 05/03/2018   Stress and adjustment reaction 05/03/2018   History of tobacco use 05/02/2018   Primary osteoarthritis of both hands 04/10/2018   Primary osteoarthritis of both  knees 04/10/2018   Primary osteoarthritis of both feet 04/10/2018   Achilles tendinitis 04/02/2018   Screening for breast cancer 03/15/2018   Positive ANA (antinuclear antibody) 03/15/2018   Screening for colon cancer 03/15/2018   Rash 03/15/2018   Achilles tendon pain 11/13/2017   Cervical myelopathy with cervical radiculopathy (HCC) 11/13/2017   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   Allergic rhinitis 06/19/2017   Edema 12/24/2015   Carpal tunnel syndrome, right 12/16/2015   Hypercalcemia 11/11/2015   Pain in both upper extremities 11/11/2015   Drug reaction 10/28/2015   Muscle spasms of both lower extremities 10/28/2015   Nerve pain 10/28/2015   Chronic asthmatic bronchitis with acute exacerbation (HCC) 09/06/2015   Environmental and seasonal allergies 07/17/2014    PCP: Lynwood LELON Rush, MD   REFERRING PROVIDER: Cordella Glendia Hutchinson, MD   REFERRING DIAG: [phone] (ICD-10-CM) - S/P rotator cuff repair  THERAPY DIAG:  Chronic left shoulder pain  Stiffness of left shoulder, not elsewhere classified  Muscle weakness (generalized)  Localized edema  Other abnormalities of gait and mobility  Other muscle spasm  Rationale for Evaluation and Treatment: Rehabilitation  ONSET DATE: 02/21/2024 left shoulder arthroscopy with debridement biceps tenodesis rotator cuff tear repair with a 2 x 3 construct  SUBJECTIVE:                                                                                                                                                                                      SUBJECTIVE STATEMENT: Patient reporting overall body soreness this morning.   Hand dominance: Right  PERTINENT HISTORY: Patient endorsed having gone through PT and multiple shoulder injection prior to appointment with Dr. Hutchinson, who scheduled and performed her surgery. Patient notes that her original pain started in Feb 2025, though did have a fall down stairs Feb 2024 and is not sure if that is  when she injured shoulder as she did not have pain at that time. Patient used CPM daily and wears sling only in public at this time, but is working  on weaning from both. Patient endorses difficulty with sleeping and can average 5 hours per night when not in so much pain. Patient has undergone knee surgery (2023), back surgery (2024), and also currently has bilat carpal tunnel with n/t in fingers and hands.  PAIN:   NPRS scale: 5/10 Pain location: Lt shoulder/arm Pain description: all pain descriptors, but most of the time achey Aggravating factors: moving a certain way, reaching, trying to do hair, picking something up   Relieving factors: tylenol  arthritis twice a day, tramadol  as needed (mid morning, before bed), muscle relaxer, arnica herb (gel for bruising and pain), lidocaine  patches, ice    PRECAUTIONS: Shoulder  RED FLAGS: None   WEIGHT BEARING RESTRICTIONS: Yes Lt shoulder   FALLS:  Has patient fallen in last 6 months? Yes. Number of falls 1, slipped in the bath tub   LIVING ENVIRONMENT: Lives with: lives with their family and lives with their son Lives in: House/apartment Stairs: Yes: Internal: 15 steps; on left going up and External: 3 steps; none Has following equipment at home: Single point cane, Quad cane small base, Walker - 2 wheeled, shower chair, Shower bench, and Grab bars  OCCUPATION: Retired; print production planner   PLOF: Independent and Independent with community mobility with device  PATIENT GOALS: to be able to use my arm again   OBJECTIVE:  Note: Objective measures were completed at Evaluation unless otherwise noted.  DIAGNOSTIC FINDINGS:    PATIENT SURVEYS :  PSFS: THE PATIENT SPECIFIC FUNCTIONAL SCALE  Place score of 0-10 (0 = unable to perform activity and 10 = able to perform activity at the same level as before injury or problem)  Activity Date: 03/20/2024  04/16/24   Get dressed and putting shoes on  4 4   2. Brush and style your hair / showering  1 5    3. Cook  2 6   4. Washing dishes  1  4   5. Clean, sweep, mop  1 2   Total Score 1.8 4.2     Total Score = Sum of activity scores/number of activities  Minimally Detectable Change: 3 points (for single activity); 2 points (for average score)  [facility] (nd). The Patient Specific Functional Scale . Retrieved from [email or web address]   COGNITION: 03/20/2024 Overall cognitive status: Within functional limits for tasks assessed     SENSATION: 03/20/2024 Light touch: diminished in fingers secondary to carpal tunnel Carpal tunnel leading to n/t in fingers;   POSTURE: 03/20/2024 Increased thoracic kyphosis, forward head, and forward rounded shoulders   UPPER EXTREMITY ROM:    ROM Right Eval 03/20/2024 Left Eval 03/20/2024 Left 04/04/24 Left 04/16/24  Shoulder flexion WFL 85 AAROM, highly painful A: 15  (supine) A: 112  (sitting) 140 (supine) P: 147  Shoulder extension      Shoulder abduction WFL 60 AAROM, highly painful  A: 85  (sitting) 101 (supine) P: 109  Shoulder adduction      Shoulder internal rotation T8 75 AAROM at 45deg abd in supine  P: 85 in 60 deg abdct  Shoulder external rotation T1 23 AAROM at 45deg abd in supine  P: 60 in 60 deg abdct  (Blank rows = not tested)  UPPER EXTREMITY MMT:  03/20/2024 unable to perform on eval due to protocol restrictions  MMT Right Eval 03/20/2024 Left Eval 03/20/2024 Right 04/24/2024 Left 04/24/2024  Shoulder flexion    2/5 c pain Unable to lift full range against gravity.   Shoulder extension  Shoulder abduction      Shoulder adduction      Shoulder internal rotation      Shoulder external rotation      Middle trapezius      Lower trapezius      Elbow flexion      Elbow extension      Wrist flexion      Wrist extension      Wrist ulnar deviation      Wrist radial deviation      Wrist pronation      Wrist supination      Grip strength (lbs)       (Blank rows = not tested)  SHOULDER SPECIAL TESTS: 03/20/2024 Did not perform on eval   JOINT MOBILITY TESTING:  03/20/2024 Not assessed on eval   PALPATION:  03/20/2024 Not assessed on eval                   TREATMENT 05/16/2024 TherEx: UBE bilat UE level 3 for 4 min fwd/4 min back Wall ladder into shoulder abduction x10  Wall ladder into shoulder flexion x10  Standing shoulder flexion with 1# DB 2x12  Standing shoulder abduction with 1# DB 2x10  Standing rows with green TB 2x15  Standing extension with green TB 2x15   05/13/2024 TherAct:  Pulleys 3 min shoulder flexion, 3 min shoulder abduction  Wall ladder into shoulder abduction x10  Wall ladder into shoulder flexion x10   TherEx:  Standing shoulder flexion with 1# DB 2x12  Standing shoulder abduction 1x10 no weight, 2x8 with 1# DB  Standing ball hold at 90deg shoulder flexion 2x30s  Standing ball hold at 90deg shoulder abduction 2x30s   05/09/2024 TherAct:  UBE level 2.5 for 4 min fwd/4 min back   Wall ladder into shoulder abduction x10  Wall ladder into shoulder flexion x10  Shoulder flexion stretch 1x8 Wall circles with pink ball, elbow extended and shoulder at 90deg; 1x30s CW and CCW  Ball hold at 90deg shoulder flexion 2x30s  Seated shoulder flexion with 2# bar 1x20  Seated shoulder abduction with 2# bar 1x15  05/07/2024 TherAct:  UBE level 2.5 for 4 min fwd/4 min back   TherEx: UE ranger into shoulder flexion and scaption 1x20 each  Wall circles with pink ball, elbow extended and shoulder at 90deg; 2x30s CW and CCW  Seated shoulder flexion 2x10 with 2# bar  Seated abduction 2x12 with 2# bar   04/30/2024 TherAct:  UBE level 2.5 4 min fwd/4 min back  Clean the mirror exercise into shoulder flexion 2x8 with 3s hold  Clean the mirror in shoulder flexion then Rt to Lt 2x3 each direction  Seated flexion with 1# bar 1x10, 1x20  Seated abduction with 1# bar 2x6     PATIENT  EDUCATION: Eval: Education details: HEP, POC, typical recovery from rotator cuff repair Person educated: Patient and Child(ren) Education method: Explanation, Demonstration, Verbal cues, and Handouts Education comprehension: verbalized understanding  HOME EXERCISE PROGRAM: Access Code: [email or web address] URL: [email or web address] Date: 04/28/2024 Prepared by: Corean Ku  Exercises - Seated Scapular Retraction  - 1 x daily - 7 x weekly - 3 sets - 10 reps - Supine Shoulder Flexion Extension AAROM with Dowel  - 2-3 x daily - 7 x weekly - 1-2 sets - 10-15 reps - 3 hold - Supine Shoulder Circles  - 2-3 x daily - 7 x weekly - 1-2 sets - 20-30 reps - Standing Isometric Shoulder External Rotation with Doorway  - 1-2 x  daily - 7 x weekly - 1 sets - 10 reps - 5-10 hold - Standing Isometric Shoulder Flexion with Doorway - Arm Bent  - 1-2 x daily - 7 x weekly - 1 sets - 10 reps - 5-10 hold - Standing Isometric Shoulder Abduction with Doorway - Arm Bent  - 1-2 x daily - 7 x weekly - 1 sets - 10 reps - 5-10 hold  ASSESSMENT:  CLINICAL IMPRESSION:  Patient arrived to session noting increased generalized soreness. Patient tolerated all activities with increased soreness during strengthening activities. Patient will continue to benefit from skilled PT.    OBJECTIVE IMPAIRMENTS: decreased activity tolerance, decreased coordination, decreased endurance, decreased mobility, decreased ROM, decreased strength, decreased safety awareness, hypomobility, increased edema, impaired flexibility, impaired sensation, impaired UE functional use, improper body mechanics, postural dysfunction, obesity, and pain.   ACTIVITY LIMITATIONS: carrying, lifting, sleeping, bed mobility, bathing, dressing, reach over head, and hygiene/grooming  PARTICIPATION LIMITATIONS: meal prep, cleaning, driving, community activity, and yard work  PERSONAL FACTORS: Past/current experiences, Time since onset of  injury/illness/exacerbation, and 3+ comorbidities: HTN, hyperlipidemia, GERD, fibromyalgia, COPD, dyspnea, cataracts, chronic obstructive bronchitis, asthma, arthritis, history of sleep apnea are also affecting patient's functional outcome.   REHAB POTENTIAL: Good  CLINICAL DECISION MAKING: Evolving/moderate complexity  EVALUATION COMPLEXITY: Moderate  GOALS: Goals reviewed with patient? Yes  SHORT TERM GOALS: Target date: 04/24/2024  Patient will show compliance with initial HEP. Baseline: Goal status: MET 04/16/24  2.  Patient will report overall pain levels no greater than 5/10. Baseline:  Goal status: ongoing 04/16/24  3.  Patient will gain full PROM and AAROM of Lt shoulder to improve functional mobility. Baseline:  Goal status: ongoing 04/16/24  4.  Patient will increase PSFS to at least 3.8 to show a significant improvement to subjective disability rating.  Baseline:  Goal status: MET 04/16/24   LONG TERM GOALS: Target date: 06/12/2024  Patient will be independent with final HEP in order to maintain and progress upon functional gains made within PT. Baseline:  Goal status: ongoing 04/16/24  2.  Patient will report overall pain levels no greater than 2/10. Baseline:  Goal status: ongoing 04/16/24  3.   Patient will increase PSFS to at least 5.8 to show a significant improvement to subjective disability rating.  Baseline:  Goal status: ongoing 04/16/24  4.  Patient will return to full AROM of Lt shoulder in order to improve overall functional mobility.  Baseline:  Goal status: ongoing 04/16/24  5.  Patient will show strength in all motions in Lt shoulder to at least 4-/5 in order to show improved biomechanics with functional mobility.  Baseline:  Goal status: ongoing 04/16/24  6.  Patient will report improvement in overall sleep through the night in order to show improved overall quality of life. Baseline:  Goal status: ongoing 04/16/24  PLAN: PT FREQUENCY: 2-3x/wk  for 6 weeks, 1-2x/wk for 6 weeks   PT DURATION: 12 weeks  PLANNED INTERVENTIONS: [id number]- PT Re-evaluation, 97750- Physical Performance Testing, 97110-Therapeutic exercises, 97530- Therapeutic activity, [id number]- Neuromuscular re-education, 97535- Self Care, [id number]- Manual therapy, [id number]- Gait training, [phone]- Canalith repositioning, [id number]- Electrical stimulation (unattended), [phone]- Electrical stimulation (manual), [id number]- Vasopneumatic device, [id number]- Ultrasound, [id number]- Traction (mechanical), [id number]- Ionotophoresis 4mg /ml Dexamethasone , 79439 (1-2 muscles), 20561 (3+ muscles)- Dry Needling, Patient/Family education, Balance training, Stair training, Taping, Joint mobilization, Joint manipulation, Spinal manipulation, Spinal mobilization, Scar mobilization, Vestibular training, DME instructions, Cryotherapy, and Moist heat  PLAN FOR NEXT SESSION:      AAROM to AROM  progression, avoiding shrug - use gravity reduced positioning.    NEXT MD VISIT: 05/30/24     Susannah Daring, PT, DPT 05/16/24 11:01 AM

## 2024-05-16 ENCOUNTER — Ambulatory Visit (INDEPENDENT_AMBULATORY_CARE_PROVIDER_SITE_OTHER)

## 2024-05-16 DIAGNOSIS — G8929 Other chronic pain: Secondary | ICD-10-CM

## 2024-05-16 DIAGNOSIS — R600 Localized edema: Secondary | ICD-10-CM | POA: Diagnosis not present

## 2024-05-16 DIAGNOSIS — M6281 Muscle weakness (generalized): Secondary | ICD-10-CM | POA: Diagnosis not present

## 2024-05-16 DIAGNOSIS — R2689 Other abnormalities of gait and mobility: Secondary | ICD-10-CM

## 2024-05-16 DIAGNOSIS — M25512 Pain in left shoulder: Secondary | ICD-10-CM

## 2024-05-16 DIAGNOSIS — M62838 Other muscle spasm: Secondary | ICD-10-CM

## 2024-05-16 DIAGNOSIS — M25612 Stiffness of left shoulder, not elsewhere classified: Secondary | ICD-10-CM

## 2024-05-19 ENCOUNTER — Ambulatory Visit: Payer: Self-pay | Admitting: Podiatry

## 2024-05-19 ENCOUNTER — Encounter: Payer: Self-pay | Admitting: Radiology

## 2024-05-20 NOTE — Therapy (Signed)
 OUTPATIENT PHYSICAL THERAPY TREATMENT   Patient Name: Frances Nelson MRN: [id number] DOB:[birth year], 68 y.o., female Today's Date: 05/20/2024  END OF SESSION:        Past Medical History:  Diagnosis Date   Allergy 01.01.1975   Ambulates with cane    straight cane   Arthritis    Asthma    Bronchitis, obstructive, chronic (HCC)    hx   Cataracts, bilateral    pt has not had surgery to remove as of 02/15/24   Complication of anesthesia    patient states slow to wake up after anesthesia   COPD (chronic obstructive pulmonary disease) (HCC)    Dyspnea    only with exertion   Fibromyalgia    GERD (gastroesophageal reflux disease)    Headache    HLD (hyperlipidemia)    Hypertension    Pneumonia    x several   Past Surgical History:  Procedure Laterality Date   ABDOMINAL HYSTERECTOMY  8.19.1990   APPENDECTOMY  9.1.1980   BICEPT TENODESIS Left 02/21/2024   Procedure: TENODESIS, BICEPS;  Surgeon: Addie Cordella Hamilton, MD;  Location: [facility] OR;  Service: Orthopedics;  Laterality: Left;   CYST EXCISION     upper right side lateral - benign   HERNIA REPAIR     Umbilical x 3   KNEE ARTHROSCOPY W/ MENISCAL REPAIR Left 12/2022   POSTERIOR LUMBAR FUSION 2 WITH HARDWARE REMOVAL Left 02/21/2024   Procedure: ARTHROSCOPY, SHOULDER WITH DEBRIDEMENT;  Surgeon: Addie Cordella Hamilton, MD;  Location: [facility] OR;  Service: Orthopedics;  Laterality: Left;  left shoulder arthroscopy, debridement, biceps tenodesis, mini open rotator cuff tear repair   SHOULDER OPEN ROTATOR CUFF REPAIR Left 02/21/2024   Procedure: REPAIR, ROTATOR CUFF, OPEN;  Surgeon: Addie Cordella Hamilton, MD;  Location: [facility] OR;  Service: Orthopedics;  Laterality: Left;   SPINE SURGERY  05/2021   TONSILLECTOMY     TRANSFORAMINAL LUMBAR INTERBODY FUSION (TLIF) WITH PEDICLE SCREW FIXATION 1 LEVEL N/A 05/18/2022   Procedure: Lumbar Four-Five Open Laminectomy/Transforaminal Lumbar Interbody Fusion/Posterolateral fusion;  Surgeon: Cheryle Debby LABOR, MD;  Location: [facility] OR;  Service: Neurosurgery;  Laterality: N/A;   Patient Active Problem List   Diagnosis Date Noted   Nail disorder 04/05/2024   Synovitis of left shoulder 03/09/2024   Biceps tendonitis on left 03/09/2024   Degenerative superior labral anterior-to-posterior (SLAP) tear of left shoulder 03/09/2024   Complete tear of left rotator cuff 03/09/2024   Obesity 10/13/2023   Shingles outbreak 05/29/2023   Microhematuria 04/09/2023   Bilateral shoulder pain 04/09/2023   Urethritis 10/05/2022   External otitis of left ear 10/05/2022   Spondylolisthesis of lumbar region 05/18/2022   Smoker 04/08/2022   Deviated nasal septum 04/08/2022   Bilateral pain of leg and foot 04/08/2022   Follicular acne 04/06/2022   Baker cyst, left 04/06/2022   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   Depression 10/04/2021   Left knee pain 10/04/2021   Chronic pain 04/09/2021   Pelvic pain 04/09/2021   HLD (hyperlipidemia) 04/09/2021   Vitamin D  deficiency 04/09/2021   Estrogen deficiency 04/09/2021   Hyperglycemia 04/01/2021   Chronic cough 12/30/2020   GERD (gastroesophageal reflux disease) 11/06/2019   Bilateral leg edema 10/16/2019   Pleuritic chest pain 10/16/2019   Hypertension 10/02/2019   Arthralgia 06/28/2018   Sinusitis 06/28/2018   Renal cyst 05/03/2018   Syncope 05/03/2018   Stress and adjustment reaction 05/03/2018   History of tobacco use 05/02/2018   Primary osteoarthritis of  both hands 04/10/2018   Primary osteoarthritis of both knees 04/10/2018   Primary osteoarthritis of both feet 04/10/2018   Achilles tendinitis 04/02/2018   Screening for breast cancer 03/15/2018   Positive ANA (antinuclear antibody) 03/15/2018   Screening for colon cancer 03/15/2018   Rash 03/15/2018   Achilles tendon pain 11/13/2017   Cervical myelopathy with cervical radiculopathy (HCC) 11/13/2017   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   Allergic  rhinitis 06/19/2017   Edema 12/24/2015   Carpal tunnel syndrome, right 12/16/2015   Hypercalcemia 11/11/2015   Pain in both upper extremities 11/11/2015   Drug reaction 10/28/2015   Muscle spasms of both lower extremities 10/28/2015   Nerve pain 10/28/2015   Chronic asthmatic bronchitis with acute exacerbation (HCC) 09/06/2015   Environmental and seasonal allergies 07/17/2014    PCP: Lynwood LELON Rush, MD   REFERRING PROVIDER: Cordella Glendia Hutchinson, MD   REFERRING DIAG: [phone] (ICD-10-CM) - S/P rotator cuff repair  THERAPY DIAG:  No diagnosis found.  Rationale for Evaluation and Treatment: Rehabilitation  ONSET DATE: 02/21/2024 left shoulder arthroscopy with debridement biceps tenodesis rotator cuff tear repair with a 2 x 3 construct  SUBJECTIVE:                                                                                                                                                                                      SUBJECTIVE STATEMENT: *** Patient reporting overall body soreness this morning.   Hand dominance: Right  PERTINENT HISTORY: Patient endorsed having gone through PT and multiple shoulder injection prior to appointment with Dr. Hutchinson, who scheduled and performed her surgery. Patient notes that her original pain started in Feb 2025, though did have a fall down stairs Feb 2024 and is not sure if that is when she injured shoulder as she did not have pain at that time. Patient used CPM daily and wears sling only in public at this time, but is working on weaning from both. Patient endorses difficulty with sleeping and can average 5 hours per night when not in so much pain. Patient has undergone knee surgery (2023), back surgery (2024), and also currently has bilat carpal tunnel with n/t in fingers and hands.  PAIN:   NPRS scale: 5/10 Pain location: Lt shoulder/arm Pain description: all pain descriptors, but most of the time achey Aggravating factors: moving a certain way,  reaching, trying to do hair, picking something up   Relieving factors: tylenol  arthritis twice a day, tramadol  as needed (mid morning, before bed), muscle relaxer, arnica herb (gel for bruising and pain), lidocaine  patches, ice    PRECAUTIONS: Shoulder  RED FLAGS: None   WEIGHT BEARING RESTRICTIONS: Yes  Lt shoulder   FALLS:  Has patient fallen in last 6 months? Yes. Number of falls 1, slipped in the bath tub   LIVING ENVIRONMENT: Lives with: lives with their family and lives with their son Lives in: House/apartment Stairs: Yes: Internal: 15 steps; on left going up and External: 3 steps; none Has following equipment at home: Single point cane, Quad cane small base, Walker - 2 wheeled, shower chair, Shower bench, and Grab bars  OCCUPATION: Retired; print production planner   PLOF: Independent and Independent with community mobility with device  PATIENT GOALS: to be able to use my arm again   OBJECTIVE:  Note: Objective measures were completed at Evaluation unless otherwise noted.  DIAGNOSTIC FINDINGS:    PATIENT SURVEYS :  PSFS: THE PATIENT SPECIFIC FUNCTIONAL SCALE  Place score of 0-10 (0 = unable to perform activity and 10 = able to perform activity at the same level as before injury or problem)  Activity Date: 03/20/2024  04/16/24   Get dressed and putting shoes on  4 4   2. Brush and style your hair / showering  1 5   3. Cook  2 6   4. Washing dishes  1  4   5. Clean, sweep, mop  1 2   Total Score 1.8 4.2     Total Score = Sum of activity scores/number of activities  Minimally Detectable Change: 3 points (for single activity); 2 points (for average score)  [facility] (nd). The Patient Specific Functional Scale . Retrieved from [email or web address]   COGNITION: 03/20/2024 Overall cognitive status: Within functional limits for tasks assessed     SENSATION: 03/20/2024 Light touch: diminished in fingers secondary  to carpal tunnel Carpal tunnel leading to n/t in fingers;   POSTURE: 03/20/2024 Increased thoracic kyphosis, forward head, and forward rounded shoulders   UPPER EXTREMITY ROM:    ROM Right Eval 03/20/2024 Left Eval 03/20/2024 Left 04/04/24 Left 04/16/24  Shoulder flexion WFL 85 AAROM, highly painful A: 15  (supine) A: 112  (sitting) 140 (supine) P: 147  Shoulder extension      Shoulder abduction WFL 60 AAROM, highly painful  A: 85  (sitting) 101 (supine) P: 109  Shoulder adduction      Shoulder internal rotation T8 75 AAROM at 45deg abd in supine  P: 85 in 60 deg abdct  Shoulder external rotation T1 23 AAROM at 45deg abd in supine  P: 60 in 60 deg abdct  (Blank rows = not tested)  UPPER EXTREMITY MMT:  03/20/2024 unable to perform on eval due to protocol restrictions  MMT Right Eval 03/20/2024 Left Eval 03/20/2024 Right 04/24/2024 Left 04/24/2024  Shoulder flexion    2/5 c pain Unable to lift full range against gravity.   Shoulder extension      Shoulder abduction      Shoulder adduction      Shoulder internal rotation      Shoulder external rotation      Middle trapezius      Lower trapezius      Elbow flexion      Elbow extension      Wrist flexion      Wrist extension      Wrist ulnar deviation      Wrist radial deviation      Wrist pronation      Wrist supination      Grip strength (lbs)      (Blank rows = not tested)  SHOULDER SPECIAL TESTS: 03/20/2024  Did not perform on eval   JOINT MOBILITY TESTING:  03/20/2024 Not assessed on eval   PALPATION:  03/20/2024 Not assessed on eval                   TREATMENT 05/21/2024 ***   05/16/2024 TherEx: UBE bilat UE level 3 for 4 min fwd/4 min back Wall ladder into shoulder abduction x10  Wall ladder into shoulder flexion x10  Standing shoulder flexion with 1# DB 2x12  Standing shoulder abduction with 1# DB 2x10  Standing rows with green TB 2x15  Standing extension with green TB  2x15   05/13/2024 TherAct:  Pulleys 3 min shoulder flexion, 3 min shoulder abduction  Wall ladder into shoulder abduction x10  Wall ladder into shoulder flexion x10   TherEx:  Standing shoulder flexion with 1# DB 2x12  Standing shoulder abduction 1x10 no weight, 2x8 with 1# DB  Standing ball hold at 90deg shoulder flexion 2x30s  Standing ball hold at 90deg shoulder abduction 2x30s   05/09/2024 TherAct:  UBE level 2.5 for 4 min fwd/4 min back   Wall ladder into shoulder abduction x10  Wall ladder into shoulder flexion x10  Shoulder flexion stretch 1x8 Wall circles with pink ball, elbow extended and shoulder at 90deg; 1x30s CW and CCW  Ball hold at 90deg shoulder flexion 2x30s  Seated shoulder flexion with 2# bar 1x20  Seated shoulder abduction with 2# bar 1x15  05/07/2024 TherAct:  UBE level 2.5 for 4 min fwd/4 min back   TherEx: UE ranger into shoulder flexion and scaption 1x20 each  Wall circles with pink ball, elbow extended and shoulder at 90deg; 2x30s CW and CCW  Seated shoulder flexion 2x10 with 2# bar  Seated abduction 2x12 with 2# bar       PATIENT EDUCATION: Eval: Education details: HEP, POC, typical recovery from rotator cuff repair Person educated: Patient and Child(ren) Education method: Explanation, Demonstration, Verbal cues, and Handouts Education comprehension: verbalized understanding  HOME EXERCISE PROGRAM: Access Code: [email or web address] URL: [email or web address] Date: 04/28/2024 Prepared by: Corean Ku  Exercises - Seated Scapular Retraction  - 1 x daily - 7 x weekly - 3 sets - 10 reps - Supine Shoulder Flexion Extension AAROM with Dowel  - 2-3 x daily - 7 x weekly - 1-2 sets - 10-15 reps - 3 hold - Supine Shoulder Circles  - 2-3 x daily - 7 x weekly - 1-2 sets - 20-30 reps - Standing Isometric Shoulder External Rotation with Doorway  - 1-2 x daily - 7 x weekly - 1 sets - 10 reps - 5-10 hold - Standing Isometric Shoulder  Flexion with Doorway - Arm Bent  - 1-2 x daily - 7 x weekly - 1 sets - 10 reps - 5-10 hold - Standing Isometric Shoulder Abduction with Doorway - Arm Bent  - 1-2 x daily - 7 x weekly - 1 sets - 10 reps - 5-10 hold  ASSESSMENT:  CLINICAL IMPRESSION:  *** Patient arrived to session noting increased generalized soreness. Patient tolerated all activities with increased soreness during strengthening activities. Patient will continue to benefit from skilled PT.    OBJECTIVE IMPAIRMENTS: decreased activity tolerance, decreased coordination, decreased endurance, decreased mobility, decreased ROM, decreased strength, decreased safety awareness, hypomobility, increased edema, impaired flexibility, impaired sensation, impaired UE functional use, improper body mechanics, postural dysfunction, obesity, and pain.   ACTIVITY LIMITATIONS: carrying, lifting, sleeping, bed mobility, bathing, dressing, reach over head, and hygiene/grooming  PARTICIPATION LIMITATIONS: meal prep, cleaning, driving, community activity,  and yard work  PERSONAL FACTORS: Past/current experiences, Time since onset of injury/illness/exacerbation, and 3+ comorbidities: HTN, hyperlipidemia, GERD, fibromyalgia, COPD, dyspnea, cataracts, chronic obstructive bronchitis, asthma, arthritis, history of sleep apnea are also affecting patient's functional outcome.   REHAB POTENTIAL: Good  CLINICAL DECISION MAKING: Evolving/moderate complexity  EVALUATION COMPLEXITY: Moderate  GOALS: Goals reviewed with patient? Yes  SHORT TERM GOALS: Target date: 04/24/2024  Patient will show compliance with initial HEP. Baseline: Goal status: MET 04/16/24  2.  Patient will report overall pain levels no greater than 5/10. Baseline:  Goal status: ongoing 04/16/24  3.  Patient will gain full PROM and AAROM of Lt shoulder to improve functional mobility. Baseline:  Goal status: ongoing 04/16/24  4.  Patient will increase PSFS to at least 3.8 to show a  significant improvement to subjective disability rating.  Baseline:  Goal status: MET 04/16/24   LONG TERM GOALS: Target date: 06/12/2024  Patient will be independent with final HEP in order to maintain and progress upon functional gains made within PT. Baseline:  Goal status: ongoing 04/16/24  2.  Patient will report overall pain levels no greater than 2/10. Baseline:  Goal status: ongoing 04/16/24  3.   Patient will increase PSFS to at least 5.8 to show a significant improvement to subjective disability rating.  Baseline:  Goal status: ongoing 04/16/24  4.  Patient will return to full AROM of Lt shoulder in order to improve overall functional mobility.  Baseline:  Goal status: ongoing 04/16/24  5.  Patient will show strength in all motions in Lt shoulder to at least 4-/5 in order to show improved biomechanics with functional mobility.  Baseline:  Goal status: ongoing 04/16/24  6.  Patient will report improvement in overall sleep through the night in order to show improved overall quality of life. Baseline:  Goal status: ongoing 04/16/24  PLAN: PT FREQUENCY: 2-3x/wk for 6 weeks, 1-2x/wk for 6 weeks   PT DURATION: 12 weeks  PLANNED INTERVENTIONS: [id number]- PT Re-evaluation, 97750- Physical Performance Testing, 97110-Therapeutic exercises, 97530- Therapeutic activity, [id number]- Neuromuscular re-education, 97535- Self Care, [id number]- Manual therapy, [id number]- Gait training, [phone]- Canalith repositioning, [id number]- Electrical stimulation (unattended), [phone]- Electrical stimulation (manual), [id number]- Vasopneumatic device, [id number]- Ultrasound, [id number]- Traction (mechanical), [id number]- Ionotophoresis 4mg /ml Dexamethasone , [id number] (1-2 muscles), [id number] (3+ muscles)- Dry Needling, Patient/Family education, Balance training, Stair training, Taping, Joint mobilization, Joint manipulation, Spinal manipulation, Spinal mobilization, Scar mobilization, Vestibular training, DME instructions, Cryotherapy, and Moist heat  PLAN  FOR NEXT SESSION:      *** AAROM to AROM progression, avoiding shrug - use gravity reduced positioning.    NEXT MD VISIT: 05/30/24     Susannah Daring, PT, DPT 05/20/24 11:35 AM

## 2024-05-21 ENCOUNTER — Ambulatory Visit (INDEPENDENT_AMBULATORY_CARE_PROVIDER_SITE_OTHER)

## 2024-05-21 DIAGNOSIS — R2689 Other abnormalities of gait and mobility: Secondary | ICD-10-CM

## 2024-05-21 DIAGNOSIS — M6281 Muscle weakness (generalized): Secondary | ICD-10-CM

## 2024-05-21 DIAGNOSIS — M25612 Stiffness of left shoulder, not elsewhere classified: Secondary | ICD-10-CM | POA: Diagnosis not present

## 2024-05-21 DIAGNOSIS — M25512 Pain in left shoulder: Secondary | ICD-10-CM

## 2024-05-21 DIAGNOSIS — G8929 Other chronic pain: Secondary | ICD-10-CM

## 2024-05-21 DIAGNOSIS — R600 Localized edema: Secondary | ICD-10-CM | POA: Diagnosis not present

## 2024-05-21 DIAGNOSIS — M62838 Other muscle spasm: Secondary | ICD-10-CM

## 2024-05-22 NOTE — Therapy (Signed)
 OUTPATIENT PHYSICAL THERAPY TREATMENT    Patient Name: Frances Nelson MRN: [id number] DOB:[birth year], 68 y.o., female Today's Date: 05/23/2024  END OF SESSION:  PT End of Session - 05/23/24 1107     Visit Number 21    Number of Visits 30    Date for Recertification  06/12/24    Authorization Type [employer] AND [employer] [place]    Progress Note Due on Visit 20    PT Start Time 1103    PT Stop Time 1141    PT Time Calculation (min) 38 min    Activity Tolerance Patient limited by pain    Behavior During Therapy [facility] for tasks assessed/performed              Past Medical History:  Diagnosis Date   Allergy 01.01.1975   Ambulates with cane    straight cane   Arthritis    Asthma    Bronchitis, obstructive, chronic (HCC)    hx   Cataracts, bilateral    pt has not had surgery to remove as of 02/15/24   Complication of anesthesia    patient states slow to wake up after anesthesia   COPD (chronic obstructive pulmonary disease) (HCC)    Dyspnea    only with exertion   Fibromyalgia    GERD (gastroesophageal reflux disease)    Headache    HLD (hyperlipidemia)    Hypertension    Pneumonia    x several   Past Surgical History:  Procedure Laterality Date   ABDOMINAL HYSTERECTOMY  8.19.1990   APPENDECTOMY  9.1.1980   BICEPT TENODESIS Left 02/21/2024   Procedure: TENODESIS, BICEPS;  Surgeon: Addie Cordella Hamilton, MD;  Location: [facility] OR;  Service: Orthopedics;  Laterality: Left;   CYST EXCISION     upper right side lateral - benign   HERNIA REPAIR     Umbilical x 3   KNEE ARTHROSCOPY W/ MENISCAL REPAIR Left 12/2022   POSTERIOR LUMBAR FUSION 2 WITH HARDWARE REMOVAL Left 02/21/2024   Procedure: ARTHROSCOPY, SHOULDER WITH DEBRIDEMENT;  Surgeon: Addie Cordella Hamilton, MD;  Location: [facility] OR;  Service: Orthopedics;  Laterality: Left;  left shoulder arthroscopy, debridement, biceps tenodesis, mini open rotator cuff tear repair   SHOULDER OPEN ROTATOR CUFF REPAIR Left 02/21/2024    Procedure: REPAIR, ROTATOR CUFF, OPEN;  Surgeon: Addie Cordella Hamilton, MD;  Location: [facility] OR;  Service: Orthopedics;  Laterality: Left;   SPINE SURGERY  05/2021   TONSILLECTOMY     TRANSFORAMINAL LUMBAR INTERBODY FUSION (TLIF) WITH PEDICLE SCREW FIXATION 1 LEVEL N/A 05/18/2022   Procedure: Lumbar Four-Five Open Laminectomy/Transforaminal Lumbar Interbody Fusion/Posterolateral fusion;  Surgeon: Cheryle Debby LABOR, MD;  Location: [facility] OR;  Service: Neurosurgery;  Laterality: N/A;   Patient Active Problem List   Diagnosis Date Noted   Nail disorder 04/05/2024   Synovitis of left shoulder 03/09/2024   Biceps tendonitis on left 03/09/2024   Degenerative superior labral anterior-to-posterior (SLAP) tear of left shoulder 03/09/2024   Complete tear of left rotator cuff 03/09/2024   Obesity 10/13/2023   Shingles outbreak 05/29/2023   Microhematuria 04/09/2023   Bilateral shoulder pain 04/09/2023   Urethritis 10/05/2022   External otitis of left ear 10/05/2022   Spondylolisthesis of lumbar region 05/18/2022   Smoker 04/08/2022   Deviated nasal septum 04/08/2022   Bilateral pain of leg and foot 04/08/2022   Follicular acne 04/06/2022   Baker cyst, left 04/06/2022   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   Depression 10/04/2021  Left knee pain 10/04/2021   Chronic pain 04/09/2021   Pelvic pain 04/09/2021   HLD (hyperlipidemia) 04/09/2021   Vitamin D  deficiency 04/09/2021   Estrogen deficiency 04/09/2021   Hyperglycemia 04/01/2021   Chronic cough 12/30/2020   GERD (gastroesophageal reflux disease) 11/06/2019   Bilateral leg edema 10/16/2019   Pleuritic chest pain 10/16/2019   Hypertension 10/02/2019   Arthralgia 06/28/2018   Sinusitis 06/28/2018   Renal cyst 05/03/2018   Syncope 05/03/2018   Stress and adjustment reaction 05/03/2018   History of tobacco use 05/02/2018   Primary osteoarthritis of both hands 04/10/2018   Primary osteoarthritis of both  knees 04/10/2018   Primary osteoarthritis of both feet 04/10/2018   Achilles tendinitis 04/02/2018   Screening for breast cancer 03/15/2018   Positive ANA (antinuclear antibody) 03/15/2018   Screening for colon cancer 03/15/2018   Rash 03/15/2018   Achilles tendon pain 11/13/2017   Cervical myelopathy with cervical radiculopathy (HCC) 11/13/2017   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   Allergic rhinitis 06/19/2017   Edema 12/24/2015   Carpal tunnel syndrome, right 12/16/2015   Hypercalcemia 11/11/2015   Pain in both upper extremities 11/11/2015   Drug reaction 10/28/2015   Muscle spasms of both lower extremities 10/28/2015   Nerve pain 10/28/2015   Chronic asthmatic bronchitis with acute exacerbation (HCC) 09/06/2015   Environmental and seasonal allergies 07/17/2014    PCP: Lynwood LELON Rush, MD   REFERRING PROVIDER: Cordella Glendia Hutchinson, MD   REFERRING DIAG: [phone] (ICD-10-CM) - S/P rotator cuff repair  THERAPY DIAG:  Chronic left shoulder pain  Stiffness of left shoulder, not elsewhere classified  Muscle weakness (generalized)  Localized edema  Other abnormalities of gait and mobility  Other muscle spasm  Rationale for Evaluation and Treatment: Rehabilitation  ONSET DATE: 02/21/2024 left shoulder arthroscopy with debridement biceps tenodesis rotator cuff tear repair with a 2 x 3 construct  SUBJECTIVE:                                                                                                                                                                                      SUBJECTIVE STATEMENT: Patient endorsing pain in hips that is bad enough that she feels she is having a hard time getting out of bed.   Hand dominance: Right  PERTINENT HISTORY: Patient endorsed having gone through PT and multiple shoulder injection prior to appointment with Dr. Hutchinson, who scheduled and performed her surgery. Patient notes that her original pain started in Feb 2025, though did have  a fall down stairs Feb 2024 and is not sure if that is when she injured shoulder as she did not have pain at that time.  Patient used CPM daily and wears sling only in public at this time, but is working on weaning from both. Patient endorses difficulty with sleeping and can average 5 hours per night when not in so much pain. Patient has undergone knee surgery (2023), back surgery (2024), and also currently has bilat carpal tunnel with n/t in fingers and hands.  PAIN:   NPRS scale: 4/10 Pain location: Lt shoulder/arm Pain description: all pain descriptors, but most of the time achey Aggravating factors: moving a certain way, reaching, trying to do hair, picking something up   Relieving factors: tylenol  arthritis twice a day, tramadol  as needed (mid morning, before bed), muscle relaxer, arnica herb (gel for bruising and pain), lidocaine  patches, ice    PRECAUTIONS: Shoulder  RED FLAGS: None   WEIGHT BEARING RESTRICTIONS: Yes Lt shoulder   FALLS:  Has patient fallen in last 6 months? Yes. Number of falls 1, slipped in the bath tub   LIVING ENVIRONMENT: Lives with: lives with their family and lives with their son Lives in: House/apartment Stairs: Yes: Internal: 15 steps; on left going up and External: 3 steps; none Has following equipment at home: Single point cane, Quad cane small base, Walker - 2 wheeled, shower chair, Shower bench, and Grab bars  OCCUPATION: Retired; print production planner   PLOF: Independent and Independent with community mobility with device  PATIENT GOALS: to be able to use my arm again   OBJECTIVE:  Note: Objective measures were completed at Evaluation unless otherwise noted.  DIAGNOSTIC FINDINGS:    PATIENT SURVEYS :  PSFS: THE PATIENT SPECIFIC FUNCTIONAL SCALE  Place score of 0-10 (0 = unable to perform activity and 10 = able to perform activity at the same level as before injury or problem)  Activity Date: 03/20/2024  04/16/24 05/21/2024  Get dressed and  putting shoes on  4 4 5   2. Brush and style your hair / showering  1 5 4   3. Cook  2 6 5   4. Washing dishes  1  4 6   5. Clean, sweep, mop  1 2 6   Total Score 1.8 4.2 5.2    Total Score = Sum of activity scores/number of activities  Minimally Detectable Change: 3 points (for single activity); 2 points (for average score)  [facility] (nd). The Patient Specific Functional Scale . Retrieved from [email or web address]   COGNITION: 03/20/2024 Overall cognitive status: Within functional limits for tasks assessed     SENSATION: 03/20/2024 Light touch: diminished in fingers secondary to carpal tunnel Carpal tunnel leading to n/t in fingers;   POSTURE: 03/20/2024 Increased thoracic kyphosis, forward head, and forward rounded shoulders   UPPER EXTREMITY ROM:    ROM Right Eval 03/20/2024 Left Eval 03/20/2024 Left 04/04/24 Left 04/16/24 Lt   Shoulder flexion WFL 85 AAROM, highly painful A: 15  (supine) A: 112  (sitting) 140 (supine) P: 147 Standing AROM: 130deg  Shoulder extension       Shoulder abduction WFL 60 AAROM, highly painful  A: 85  (sitting) 101 (supine) P: 109 Standing AROM: 98deg  Shoulder adduction       Shoulder internal rotation T8 75 AAROM at 45deg abd in supine  P: 85 in 60 deg abdct   Shoulder external rotation T1 23 AAROM at 45deg abd in supine  P: 60 in 60 deg abdct   (Blank rows = not tested)  UPPER EXTREMITY MMT:  03/20/2024 unable to perform on eval due to protocol restrictions  MMT Right Eval 03/20/2024 Left  Eval 03/20/2024 Right 04/24/2024 Left 04/24/2024  Shoulder flexion    2/5 c pain Unable to lift full range against gravity.   Shoulder extension      Shoulder abduction      Shoulder adduction      Shoulder internal rotation      Shoulder external rotation      Middle trapezius      Lower trapezius      Elbow flexion      Elbow extension      Wrist flexion      Wrist extension       Wrist ulnar deviation      Wrist radial deviation      Wrist pronation      Wrist supination      Grip strength (lbs)      (Blank rows = not tested)  SHOULDER SPECIAL TESTS: 03/20/2024 Did not perform on eval   JOINT MOBILITY TESTING:  03/20/2024 Not assessed on eval   PALPATION:  03/20/2024 Not assessed on eval                   TREATMENT 05/23/2024 TherEx:  UBE bilat UE level 3 for 4 min fwd/4 min back Wall ladder into shoulder abduction 2x15 Wall ladder into shoulder flexion 2x15  Seated bicep curls with 2# DBs 2x12  Seated flexion with 2# DBs 2x8 Seated abduction with 2# DB 2x6 Seated D2 with no weight 2x8   05/21/2024 TherEx:  UBE bilat UE level 3 for 4 min fwd/4 min back Wall ladder into shoulder abduction x10  Wall ladder into shoulder flexion x10  Seated bicep curls with 2# bar 2x20  Neuro Re-Ed:  Standing D1 PNF pattern 1x10 with no weight  Standing D2 PNF pattern 1x8 with no weight  Standing rows with blue TB 2x10 with 2s hold Standing extension with blue TB 2x10 with 1-2s hold  Seated rhythmic stabilization with elbow flexed to 90deg with gentle perturbations in ER/IR/ext/flex (elbow flexion perturbation closer to elbow than wrist) 2x30s     05/16/2024 TherEx: UBE bilat UE level 3 for 4 min fwd/4 min back Wall ladder into shoulder abduction x10  Wall ladder into shoulder flexion x10  Standing shoulder flexion with 1# DB 2x12  Standing shoulder abduction with 1# DB 2x10  Standing rows with green TB 2x15  Standing extension with green TB 2x15   05/13/2024 TherAct:  Pulleys 3 min shoulder flexion, 3 min shoulder abduction  Wall ladder into shoulder abduction x10  Wall ladder into shoulder flexion x10   TherEx:  Standing shoulder flexion with 1# DB 2x12  Standing shoulder abduction 1x10 no weight, 2x8 with 1# DB  Standing ball hold at 90deg shoulder flexion 2x30s  Standing ball hold at 90deg shoulder abduction 2x30s      PATIENT  EDUCATION: Eval: Education details: HEP, POC, typical recovery from rotator cuff repair Person educated: Patient and Child(ren) Education method: Explanation, Demonstration, Verbal cues, and Handouts Education comprehension: verbalized understanding  HOME EXERCISE PROGRAM: Access Code: [email or web address] URL: [email or web address] Date: 04/28/2024 Prepared by: Corean Ku  Exercises - Seated Scapular Retraction  - 1 x daily - 7 x weekly - 3 sets - 10 reps - Supine Shoulder Flexion Extension AAROM with Dowel  - 2-3 x daily - 7 x weekly - 1-2 sets - 10-15 reps - 3 hold - Supine Shoulder Circles  - 2-3 x daily - 7 x weekly - 1-2 sets - 20-30 reps - Standing Isometric Shoulder External Rotation  with Doorway  - 1-2 x daily - 7 x weekly - 1 sets - 10 reps - 5-10 hold - Standing Isometric Shoulder Flexion with Doorway - Arm Bent  - 1-2 x daily - 7 x weekly - 1 sets - 10 reps - 5-10 hold - Standing Isometric Shoulder Abduction with Doorway - Arm Bent  - 1-2 x daily - 7 x weekly - 1 sets - 10 reps - 5-10 hold  ASSESSMENT:  CLINICAL IMPRESSION:  Patient arrived to session noting similar body pain and difficulty with only slight soreness in LT shoulder. Patient tolerated all activities this date, though had difficulty with standing exercises secondary to body pain. Patient will continue to benefit from skilled PT.    OBJECTIVE IMPAIRMENTS: decreased activity tolerance, decreased coordination, decreased endurance, decreased mobility, decreased ROM, decreased strength, decreased safety awareness, hypomobility, increased edema, impaired flexibility, impaired sensation, impaired UE functional use, improper body mechanics, postural dysfunction, obesity, and pain.   ACTIVITY LIMITATIONS: carrying, lifting, sleeping, bed mobility, bathing, dressing, reach over head, and hygiene/grooming  PARTICIPATION LIMITATIONS: meal prep, cleaning, driving, community activity, and yard work  PERSONAL  FACTORS: Past/current experiences, Time since onset of injury/illness/exacerbation, and 3+ comorbidities: HTN, hyperlipidemia, GERD, fibromyalgia, COPD, dyspnea, cataracts, chronic obstructive bronchitis, asthma, arthritis, history of sleep apnea are also affecting patient's functional outcome.   REHAB POTENTIAL: Good  CLINICAL DECISION MAKING: Evolving/moderate complexity  EVALUATION COMPLEXITY: Moderate  GOALS: Goals reviewed with patient? Yes  SHORT TERM GOALS: Target date: 04/24/2024  Patient will show compliance with initial HEP. Baseline: Goal status: MET 04/16/24  2.  Patient will report overall pain levels no greater than 5/10. Baseline:  Goal status: MET 05/21/2024  3.  Patient will gain full PROM and AAROM of Lt shoulder to improve functional mobility. Baseline:  Goal status: ongoing 05/21/2024  4.  Patient will increase PSFS to at least 3.8 to show a significant improvement to subjective disability rating.  Baseline:  Goal status: MET 04/16/24   LONG TERM GOALS: Target date: 06/12/2024  Patient will be independent with final HEP in order to maintain and progress upon functional gains made within PT. Baseline:  Goal status: ongoing 05/21/2024  2.  Patient will report overall pain levels no greater than 2/10. Baseline:  Goal status: ongoing 05/21/2024  3.   Patient will increase PSFS to at least 5.8 to show a significant improvement to subjective disability rating.  Baseline:  Goal status: ongoing 05/21/2024  4.  Patient will return to full AROM of Lt shoulder in order to improve overall functional mobility.  Baseline:  Goal status: ongoing 05/21/2024  5.  Patient will show strength in all motions in Lt shoulder to at least 4-/5 in order to show improved biomechanics with functional mobility.  Baseline:  Goal status: ongoing 05/21/2024  6.  Patient will report improvement in overall sleep through the night in order to show improved overall quality of  life. Baseline:  Goal status: ongoing 05/21/2024  PLAN: PT FREQUENCY: 2-3x/wk for 6 weeks, 1-2x/wk for 6 weeks   PT DURATION: 12 weeks  PLANNED INTERVENTIONS: [id number]- PT Re-evaluation, 97750- Physical Performance Testing, 97110-Therapeutic exercises, 97530- Therapeutic activity, [id number]- Neuromuscular re-education, 97535- Self Care, [id number]- Manual therapy, [id number]- Gait training, [phone]- Canalith repositioning, [id number]- Electrical stimulation (unattended), [phone]- Electrical stimulation (manual), 97016- Vasopneumatic device, [id number]- Ultrasound, [id number]- Traction (mechanical), [id number]- Ionotophoresis 4mg /ml Dexamethasone , [id number] (1-2 muscles), [id number] (3+ muscles)- Dry Needling, Patient/Family education, Balance training, Stair training, Taping, Joint mobilization, Joint manipulation, Spinal manipulation, Spinal mobilization, Scar mobilization,  Vestibular training, DME instructions, Cryotherapy, and Moist heat  PLAN FOR NEXT SESSION:     functional IR stretch, AAROM to AROM progression, avoiding shrug - use gravity reduced positioning.    NEXT MD VISIT: 05/30/24  Susannah Daring, PT, DPT 05/23/24 12:08 PM

## 2024-05-23 ENCOUNTER — Ambulatory Visit

## 2024-05-23 DIAGNOSIS — G8929 Other chronic pain: Secondary | ICD-10-CM

## 2024-05-23 DIAGNOSIS — M25512 Pain in left shoulder: Secondary | ICD-10-CM | POA: Diagnosis not present

## 2024-05-23 DIAGNOSIS — R600 Localized edema: Secondary | ICD-10-CM

## 2024-05-23 DIAGNOSIS — M62838 Other muscle spasm: Secondary | ICD-10-CM

## 2024-05-23 DIAGNOSIS — M25612 Stiffness of left shoulder, not elsewhere classified: Secondary | ICD-10-CM

## 2024-05-23 DIAGNOSIS — M6281 Muscle weakness (generalized): Secondary | ICD-10-CM | POA: Diagnosis not present

## 2024-05-23 DIAGNOSIS — R2689 Other abnormalities of gait and mobility: Secondary | ICD-10-CM

## 2024-05-26 NOTE — Therapy (Signed)
 OUTPATIENT PHYSICAL THERAPY TREATMENT    Patient Name: Frances Nelson MRN: [id number] DOB:1956/03/27, 68 y.o., female Today's Date: 05/27/2024  END OF SESSION:  PT End of Session - 05/27/24 1146     Visit Number 22    Number of Visits 30    Date for Recertification  06/12/24    Authorization Type [employer]IC[employer] AND [employer] [place]    Progress Note Due on Visit 20    PT Start Time 1147    PT Stop Time 1226    PT Time Calculation (min) 39 min    Activity Tolerance Patient limited by pain    Behavior During Therapy [facility] for tasks assessed/performed               Past Medical History:  Diagnosis Date   Allergy 01.01.1975   Ambulates with cane    straight cane   Arthritis    Asthma    Bronchitis, obstructive, chronic (HCC)    hx   Cataracts, bilateral    pt has not had surgery to remove as of 02/15/24   Complication of anesthesia    patient states slow to wake up after anesthesia   COPD (chronic obstructive pulmonary disease) (HCC)    Dyspnea    only with exertion   Fibromyalgia    GERD (gastroesophageal reflux disease)    Headache    HLD (hyperlipidemia)    Hypertension    Pneumonia    x several   Past Surgical History:  Procedure Laterality Date   ABDOMINAL HYSTERECTOMY  8.19.1990   APPENDECTOMY  9.1.1980   BICEPT TENODESIS Left 02/21/2024   Procedure: TENODESIS, BICEPS;  Surgeon: Addie Cordella Hamilton, MD;  Location: [facility] OR;  Service: Orthopedics;  Laterality: Left;   CYST EXCISION     upper right side lateral - benign   HERNIA REPAIR     Umbilical x 3   KNEE ARTHROSCOPY W/ MENISCAL REPAIR Left 12/2022   POSTERIOR LUMBAR FUSION 2 WITH HARDWARE REMOVAL Left 02/21/2024   Procedure: ARTHROSCOPY, SHOULDER WITH DEBRIDEMENT;  Surgeon: Addie Cordella Hamilton, MD;  Location: [facility] OR;  Service: Orthopedics;  Laterality: Left;  left shoulder arthroscopy, debridement, biceps tenodesis, mini open rotator cuff tear repair   SHOULDER OPEN ROTATOR CUFF REPAIR Left 02/21/2024    Procedure: REPAIR, ROTATOR CUFF, OPEN;  Surgeon: Addie Cordella Hamilton, MD;  Location: [facility] OR;  Service: Orthopedics;  Laterality: Left;   SPINE SURGERY  05/2021   TONSILLECTOMY     TRANSFORAMINAL LUMBAR INTERBODY FUSION (TLIF) WITH PEDICLE SCREW FIXATION 1 LEVEL N/A 05/18/2022   Procedure: Lumbar Four-Five Open Laminectomy/Transforaminal Lumbar Interbody Fusion/Posterolateral fusion;  Surgeon: Cheryle Debby LABOR, MD;  Location: [facility] OR;  Service: Neurosurgery;  Laterality: N/A;   Patient Active Problem List   Diagnosis Date Noted   Nail disorder 04/05/2024   Synovitis of left shoulder 03/09/2024   Biceps tendonitis on left 03/09/2024   Degenerative superior labral anterior-to-posterior (SLAP) tear of left shoulder 03/09/2024   Complete tear of left rotator cuff 03/09/2024   Obesity 10/13/2023   Shingles outbreak 05/29/2023   Microhematuria 04/09/2023   Bilateral shoulder pain 04/09/2023   Urethritis 10/05/2022   External otitis of left ear 10/05/2022   Spondylolisthesis of lumbar region 05/18/2022   Smoker 04/08/2022   Deviated nasal septum 04/08/2022   Bilateral pain of leg and foot 04/08/2022   Follicular acne 04/06/2022   Baker cyst, left 04/06/2022   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   Depression 10/04/2021  Left knee pain 10/04/2021   Chronic pain 04/09/2021   Pelvic pain 04/09/2021   HLD (hyperlipidemia) 04/09/2021   Vitamin D  deficiency 04/09/2021   Estrogen deficiency 04/09/2021   Hyperglycemia 04/01/2021   Chronic cough 12/30/2020   GERD (gastroesophageal reflux disease) 11/06/2019   Bilateral leg edema 10/16/2019   Pleuritic chest pain 10/16/2019   Hypertension 10/02/2019   Arthralgia 06/28/2018   Sinusitis 06/28/2018   Renal cyst 05/03/2018   Syncope 05/03/2018   Stress and adjustment reaction 05/03/2018   History of tobacco use 05/02/2018   Primary osteoarthritis of both hands 04/10/2018   Primary osteoarthritis of both  knees 04/10/2018   Primary osteoarthritis of both feet 04/10/2018   Achilles tendinitis 04/02/2018   Screening for breast cancer 03/15/2018   Positive ANA (antinuclear antibody) 03/15/2018   Screening for colon cancer 03/15/2018   Rash 03/15/2018   Achilles tendon pain 11/13/2017   Cervical myelopathy with cervical radiculopathy (HCC) 11/13/2017   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   Allergic rhinitis 06/19/2017   Edema 12/24/2015   Carpal tunnel syndrome, right 12/16/2015   Hypercalcemia 11/11/2015   Pain in both upper extremities 11/11/2015   Drug reaction 10/28/2015   Muscle spasms of both lower extremities 10/28/2015   Nerve pain 10/28/2015   Chronic asthmatic bronchitis with acute exacerbation (HCC) 09/06/2015   Environmental and seasonal allergies 07/17/2014    PCP: Lynwood LELON Rush, MD   REFERRING PROVIDER: Cordella Glendia Hutchinson, MD   REFERRING DIAG: [id number] (ICD-10-CM) - S/P rotator cuff repair  THERAPY DIAG:  Chronic left shoulder pain  Stiffness of left shoulder, not elsewhere classified  Muscle weakness (generalized)  Localized edema  Other abnormalities of gait and mobility  Other muscle spasm  Rationale for Evaluation and Treatment: Rehabilitation  ONSET DATE: 02/21/2024 left shoulder arthroscopy with debridement biceps tenodesis rotator cuff tear repair with a 2 x 3 construct  SUBJECTIVE:                                                                                                                                                                                      SUBJECTIVE STATEMENT: Patient endorsing continued difficulty with generalized back, hip, and LE pain with shoulder doing okay.    Hand dominance: Right  PERTINENT HISTORY: Patient endorsed having gone through PT and multiple shoulder injection prior to appointment with Dr. Hutchinson, who scheduled and performed her surgery. Patient notes that her original pain started in Feb 2025, though did have a  fall down stairs Feb 2024 and is not sure if that is when she injured shoulder as she did not have pain at that time. Patient used CPM daily and wears  sling only in public at this time, but is working on weaning from both. Patient endorses difficulty with sleeping and can average 5 hours per night when not in so much pain. Patient has undergone knee surgery (2023), back surgery (2024), and also currently has bilat carpal tunnel with n/t in fingers and hands.  PAIN:   NPRS scale: 4.5/10 Pain location: Lt shoulder/arm Pain description: all pain descriptors, but most of the time achey Aggravating factors: moving a certain way, reaching, trying to do hair, picking something up   Relieving factors: tylenol  arthritis twice a day, tramadol  as needed (mid morning, before bed), muscle relaxer, arnica herb (gel for bruising and pain), lidocaine  patches, ice    PRECAUTIONS: Shoulder  RED FLAGS: None   WEIGHT BEARING RESTRICTIONS: Yes Lt shoulder   FALLS:  Has patient fallen in last 6 months? Yes. Number of falls 1, slipped in the bath tub   LIVING ENVIRONMENT: Lives with: lives with their family and lives with their son Lives in: House/apartment Stairs: Yes: Internal: 15 steps; on left going up and External: 3 steps; none Has following equipment at home: Single point cane, Quad cane small base, Walker - 2 wheeled, shower chair, Shower bench, and Grab bars  OCCUPATION: Retired; print production planner   PLOF: Independent and Independent with community mobility with device  PATIENT GOALS: to be able to use my arm again   OBJECTIVE:  Note: Objective measures were completed at Evaluation unless otherwise noted.  DIAGNOSTIC FINDINGS:    PATIENT SURVEYS :  PSFS: THE PATIENT SPECIFIC FUNCTIONAL SCALE  Place score of 0-10 (0 = unable to perform activity and 10 = able to perform activity at the same level as before injury or problem)  Activity Date: 03/20/2024  04/16/24 05/21/2024  Get dressed and  putting shoes on  4 4 5   2. Brush and style your hair / showering  1 5 4   3. Cook  2 6 5   4. Washing dishes  1  4 6   5. Clean, sweep, mop  1 2 6   Total Score 1.8 4.2 5.2    Total Score = Sum of activity scores/number of activities  Minimally Detectable Change: 3 points (for single activity); 2 points (for average score)  [employer] (nd). The Patient Specific Functional Scale . Retrieved from [email or web address]   COGNITION: 03/20/2024 Overall cognitive status: Within functional limits for tasks assessed     SENSATION: 03/20/2024 Light touch: diminished in fingers secondary to carpal tunnel Carpal tunnel leading to n/t in fingers;   POSTURE: 03/20/2024 Increased thoracic kyphosis, forward head, and forward rounded shoulders   UPPER EXTREMITY ROM:    ROM Right Eval 03/20/2024 Left Eval 03/20/2024 Left 04/04/24 Left 04/16/24 Lt   Shoulder flexion WFL 85 AAROM, highly painful A: 15  (supine) A: 112  (sitting) 140 (supine) P: 147 Standing AROM: 130deg  Shoulder extension       Shoulder abduction WFL 60 AAROM, highly painful  A: 85  (sitting) 101 (supine) P: 109 Standing AROM: 98deg  Shoulder adduction       Shoulder internal rotation T8 75 AAROM at 45deg abd in supine  P: 85 in 60 deg abdct   Shoulder external rotation T1 23 AAROM at 45deg abd in supine  P: 60 in 60 deg abdct   (Blank rows = not tested)  UPPER EXTREMITY MMT:  03/20/2024 unable to perform on eval due to protocol restrictions  MMT Right Eval 03/20/2024 Left Eval 03/20/2024 Right 04/24/2024 Left 04/24/2024  Shoulder flexion    2/5 c pain Unable to lift full range against gravity.   Shoulder extension      Shoulder abduction      Shoulder adduction      Shoulder internal rotation      Shoulder external rotation      Middle trapezius      Lower trapezius      Elbow flexion      Elbow extension      Wrist flexion      Wrist extension       Wrist ulnar deviation      Wrist radial deviation      Wrist pronation      Wrist supination      Grip strength (lbs)      (Blank rows = not tested)  SHOULDER SPECIAL TESTS: 03/20/2024 Did not perform on eval   JOINT MOBILITY TESTING:  03/20/2024 Not assessed on eval   PALPATION:  03/20/2024 Not assessed on eval                   TREATMENT 05/27/2024 TherEx:  UBE bilat UE level 3 for 4 min fwd/4 min back    Wall ladder into shoulder abduction and flexion 1x20 each  Supine horizontal abduction with yellow TB 1x15, red TB 1x15  Sidelying abduction with 1# DB 1x15  Sidelying ER with 1# DB 1x15  Seated bicep curls 1x8 each direction (hand in supine, neutral, then prone)  Seated shoulder flexion with 2# Dbs 1x10   05/23/2024 TherEx:  UBE bilat UE level 3 for 4 min fwd/4 min back Wall ladder into shoulder abduction 2x15 Wall ladder into shoulder flexion 2x15  Seated bicep curls with 2# DBs 2x12  Seated flexion with 2# DBs 2x8 Seated abduction with 2# DB 2x6 Seated D2 with no weight 2x8   05/21/2024 TherEx:  UBE bilat UE level 3 for 4 min fwd/4 min back Wall ladder into shoulder abduction x10  Wall ladder into shoulder flexion x10  Seated bicep curls with 2# bar 2x20  Neuro Re-Ed:  Standing D1 PNF pattern 1x10 with no weight  Standing D2 PNF pattern 1x8 with no weight  Standing rows with blue TB 2x10 with 2s hold Standing extension with blue TB 2x10 with 1-2s hold  Seated rhythmic stabilization with elbow flexed to 90deg with gentle perturbations in ER/IR/ext/flex (elbow flexion perturbation closer to elbow than wrist) 2x30s     05/16/2024 TherEx: UBE bilat UE level 3 for 4 min fwd/4 min back Wall ladder into shoulder abduction x10  Wall ladder into shoulder flexion x10  Standing shoulder flexion with 1# DB 2x12  Standing shoulder abduction with 1# DB 2x10  Standing rows with green TB 2x15  Standing extension with green TB 2x15   PATIENT  EDUCATION: Eval: Education details: HEP, POC, typical recovery from rotator cuff repair Person educated: Patient and Child(ren) Education method: Explanation, Demonstration, Verbal cues, and Handouts Education comprehension: verbalized understanding  HOME EXERCISE PROGRAM: Access Code: [email or web address] URL: [email or web address] Date: 04/28/2024 Prepared by: Corean Ku  Exercises - Seated Scapular Retraction  - 1 x daily - 7 x weekly - 3 sets - 10 reps - Supine Shoulder Flexion Extension AAROM with Dowel  - 2-3 x daily - 7 x weekly - 1-2 sets - 10-15 reps - 3 hold - Supine Shoulder Circles  - 2-3 x daily - 7 x weekly - 1-2 sets - 20-30 reps - Standing Isometric Shoulder External Rotation with Doorway  -  1-2 x daily - 7 x weekly - 1 sets - 10 reps - 5-10 hold - Standing Isometric Shoulder Flexion with Doorway - Arm Bent  - 1-2 x daily - 7 x weekly - 1 sets - 10 reps - 5-10 hold - Standing Isometric Shoulder Abduction with Doorway - Arm Bent  - 1-2 x daily - 7 x weekly - 1 sets - 10 reps - 5-10 hold  ASSESSMENT:  CLINICAL IMPRESSION:  Patient arrived to session noting continued overall soreness from lower back down to feet. Patient tolerated all activities with one instance of muscle spasms when performing sidelying shoulder abduction that resolved with rest and self-massage. Patient will continue to benefit from skilled PT.    OBJECTIVE IMPAIRMENTS: decreased activity tolerance, decreased coordination, decreased endurance, decreased mobility, decreased ROM, decreased strength, decreased safety awareness, hypomobility, increased edema, impaired flexibility, impaired sensation, impaired UE functional use, improper body mechanics, postural dysfunction, obesity, and pain.   ACTIVITY LIMITATIONS: carrying, lifting, sleeping, bed mobility, bathing, dressing, reach over head, and hygiene/grooming  PARTICIPATION LIMITATIONS: meal prep, cleaning, driving, community activity, and  yard work  PERSONAL FACTORS: Past/current experiences, Time since onset of injury/illness/exacerbation, and 3+ comorbidities: HTN, hyperlipidemia, GERD, fibromyalgia, COPD, dyspnea, cataracts, chronic obstructive bronchitis, asthma, arthritis, history of sleep apnea are also affecting patient's functional outcome.   REHAB POTENTIAL: Good  CLINICAL DECISION MAKING: Evolving/moderate complexity  EVALUATION COMPLEXITY: Moderate  GOALS: Goals reviewed with patient? Yes  SHORT TERM GOALS: Target date: 04/24/2024  Patient will show compliance with initial HEP. Baseline: Goal status: MET 04/16/24  2.  Patient will report overall pain levels no greater than 5/10. Baseline:  Goal status: MET 05/21/2024  3.  Patient will gain full PROM and AAROM of Lt shoulder to improve functional mobility. Baseline:  Goal status: ongoing 05/21/2024  4.  Patient will increase PSFS to at least 3.8 to show a significant improvement to subjective disability rating.  Baseline:  Goal status: MET 04/16/24   LONG TERM GOALS: Target date: 06/12/2024  Patient will be independent with final HEP in order to maintain and progress upon functional gains made within PT. Baseline:  Goal status: ongoing 05/21/2024  2.  Patient will report overall pain levels no greater than 2/10. Baseline:  Goal status: ongoing 05/21/2024  3.   Patient will increase PSFS to at least 5.8 to show a significant improvement to subjective disability rating.  Baseline:  Goal status: ongoing 05/21/2024  4.  Patient will return to full AROM of Lt shoulder in order to improve overall functional mobility.  Baseline:  Goal status: ongoing 05/21/2024  5.  Patient will show strength in all motions in Lt shoulder to at least 4-/5 in order to show improved biomechanics with functional mobility.  Baseline:  Goal status: ongoing 05/21/2024  6.  Patient will report improvement in overall sleep through the night in order to show improved overall  quality of life. Baseline:  Goal status: ongoing 05/21/2024  PLAN: PT FREQUENCY: 2-3x/wk for 6 weeks, 1-2x/wk for 6 weeks   PT DURATION: 12 weeks  PLANNED INTERVENTIONS: 97164- PT Re-evaluation, 97750- Physical Performance Testing, 97110-Therapeutic exercises, 97530- Therapeutic activity, [id number]- Neuromuscular re-education, 97535- Self Care, [id number]- Manual therapy, [id number]- Gait training, [phone]- Canalith repositioning, H9716- Electrical stimulation (unattended), [phone]- Electrical stimulation (manual), 97016- Vasopneumatic device, [id number]- Ultrasound, [id number]- Traction (mechanical), [id number]- Ionotophoresis 4mg /ml Dexamethasone , 79439 (1-2 muscles), 20561 (3+ muscles)- Dry Needling, Patient/Family education, Balance training, Stair training, Taping, Joint mobilization, Joint manipulation, Spinal manipulation, Spinal mobilization, Scar mobilization, Vestibular training,  DME instructions, Cryotherapy, and Moist heat  PLAN FOR NEXT SESSION:      functional IR stretch, AAROM to AROM progression, avoiding shrug - use gravity reduced positioning.    NEXT MD VISIT: 05/30/24  Susannah Daring, PT, DPT 05/27/24 1:00 PM

## 2024-05-27 ENCOUNTER — Ambulatory Visit

## 2024-05-27 ENCOUNTER — Encounter: Payer: Self-pay | Admitting: Physical Medicine & Rehabilitation

## 2024-05-27 DIAGNOSIS — M6281 Muscle weakness (generalized): Secondary | ICD-10-CM

## 2024-05-27 DIAGNOSIS — R2689 Other abnormalities of gait and mobility: Secondary | ICD-10-CM

## 2024-05-27 DIAGNOSIS — M25612 Stiffness of left shoulder, not elsewhere classified: Secondary | ICD-10-CM | POA: Diagnosis not present

## 2024-05-27 DIAGNOSIS — M62838 Other muscle spasm: Secondary | ICD-10-CM

## 2024-05-27 DIAGNOSIS — R600 Localized edema: Secondary | ICD-10-CM | POA: Diagnosis not present

## 2024-05-27 DIAGNOSIS — M25512 Pain in left shoulder: Secondary | ICD-10-CM | POA: Diagnosis not present

## 2024-05-27 DIAGNOSIS — G8929 Other chronic pain: Secondary | ICD-10-CM

## 2024-05-27 NOTE — Telephone Encounter (Signed)
Please schedule. Thanks! 

## 2024-05-29 NOTE — Therapy (Incomplete)
 OUTPATIENT PHYSICAL THERAPY TREATMENT    Patient Name: Frances Nelson MRN: [id number] DOB:1955-08-16, 68 y.o., female Today's Date: 05/29/2024  END OF SESSION:         Past Medical History:  Diagnosis Date   Allergy 01.01.1975   Ambulates with cane    straight cane   Arthritis    Asthma    Bronchitis, obstructive, chronic (HCC)    hx   Cataracts, bilateral    pt has not had surgery to remove as of 02/15/24   Complication of anesthesia    patient states slow to wake up after anesthesia   COPD (chronic obstructive pulmonary disease) (HCC)    Dyspnea    only with exertion   Fibromyalgia    GERD (gastroesophageal reflux disease)    Headache    HLD (hyperlipidemia)    Hypertension    Pneumonia    x several   Past Surgical History:  Procedure Laterality Date   ABDOMINAL HYSTERECTOMY  8.19.1990   APPENDECTOMY  9.1.1980   BICEPT TENODESIS Left 02/21/2024   Procedure: TENODESIS, BICEPS;  Surgeon: Addie Cordella Hamilton, MD;  Location: [facility] OR;  Service: Orthopedics;  Laterality: Left;   CYST EXCISION     upper right side lateral - benign   HERNIA REPAIR     Umbilical x 3   KNEE ARTHROSCOPY W/ MENISCAL REPAIR Left 12/2022   POSTERIOR LUMBAR FUSION 2 WITH HARDWARE REMOVAL Left 02/21/2024   Procedure: ARTHROSCOPY, SHOULDER WITH DEBRIDEMENT;  Surgeon: Addie Cordella Hamilton, MD;  Location: [facility] OR;  Service: Orthopedics;  Laterality: Left;  left shoulder arthroscopy, debridement, biceps tenodesis, mini open rotator cuff tear repair   SHOULDER OPEN ROTATOR CUFF REPAIR Left 02/21/2024   Procedure: REPAIR, ROTATOR CUFF, OPEN;  Surgeon: Addie Cordella Hamilton, MD;  Location: [facility] OR;  Service: Orthopedics;  Laterality: Left;   SPINE SURGERY  05/2021   TONSILLECTOMY     TRANSFORAMINAL LUMBAR INTERBODY FUSION (TLIF) WITH PEDICLE SCREW FIXATION 1 LEVEL N/A 05/18/2022   Procedure: Lumbar Four-Five Open Laminectomy/Transforaminal Lumbar Interbody Fusion/Posterolateral fusion;  Surgeon:  Cheryle Debby LABOR, MD;  Location: [facility] OR;  Service: Neurosurgery;  Laterality: N/A;   Patient Active Problem List   Diagnosis Date Noted   Nail disorder 04/05/2024   Synovitis of left shoulder 03/09/2024   Biceps tendonitis on left 03/09/2024   Degenerative superior labral anterior-to-posterior (SLAP) tear of left shoulder 03/09/2024   Complete tear of left rotator cuff 03/09/2024   Obesity 10/13/2023   Shingles outbreak 05/29/2023   Microhematuria 04/09/2023   Bilateral shoulder pain 04/09/2023   Urethritis 10/05/2022   External otitis of left ear 10/05/2022   Spondylolisthesis of lumbar region 05/18/2022   Smoker 04/08/2022   Deviated nasal septum 04/08/2022   Bilateral pain of leg and foot 04/08/2022   Follicular acne 04/06/2022   Baker cyst, left 04/06/2022   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   Depression 10/04/2021   Left knee pain 10/04/2021   Chronic pain 04/09/2021   Pelvic pain 04/09/2021   HLD (hyperlipidemia) 04/09/2021   Vitamin D  deficiency 04/09/2021   Estrogen deficiency 04/09/2021   Hyperglycemia 04/01/2021   Chronic cough 12/30/2020   GERD (gastroesophageal reflux disease) 11/06/2019   Bilateral leg edema 10/16/2019   Pleuritic chest pain 10/16/2019   Hypertension 10/02/2019   Arthralgia 06/28/2018   Sinusitis 06/28/2018   Renal cyst 05/03/2018   Syncope 05/03/2018   Stress and adjustment reaction 05/03/2018   History of tobacco use 05/02/2018   Primary  osteoarthritis of both hands 04/10/2018   Primary osteoarthritis of both knees 04/10/2018   Primary osteoarthritis of both feet 04/10/2018   Achilles tendinitis 04/02/2018   Screening for breast cancer 03/15/2018   Positive ANA (antinuclear antibody) 03/15/2018   Screening for colon cancer 03/15/2018   Rash 03/15/2018   Achilles tendon pain 11/13/2017   Cervical myelopathy with cervical radiculopathy (HCC) 11/13/2017   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    Allergic rhinitis 06/19/2017   Edema 12/24/2015   Carpal tunnel syndrome, right 12/16/2015   Hypercalcemia 11/11/2015   Pain in both upper extremities 11/11/2015   Drug reaction 10/28/2015   Muscle spasms of both lower extremities 10/28/2015   Nerve pain 10/28/2015   Chronic asthmatic bronchitis with acute exacerbation (HCC) 09/06/2015   Environmental and seasonal allergies 07/17/2014    PCP: Lynwood LELON Rush, MD   REFERRING PROVIDER: Cordella Glendia Hutchinson, MD   REFERRING DIAG: [phone] (ICD-10-CM) - S/P rotator cuff repair  THERAPY DIAG:  No diagnosis found.  Rationale for Evaluation and Treatment: Rehabilitation  ONSET DATE: 02/21/2024 left shoulder arthroscopy with debridement biceps tenodesis rotator cuff tear repair with a 2 x 3 construct  SUBJECTIVE:                                                                                                                                                                                      SUBJECTIVE STATEMENT: *** Patient endorsing continued difficulty with generalized back, hip, and LE pain with shoulder doing okay.    Hand dominance: Right  PERTINENT HISTORY: Patient endorsed having gone through PT and multiple shoulder injection prior to appointment with Dr. Hutchinson, who scheduled and performed her surgery. Patient notes that her original pain started in Feb 2025, though did have a fall down stairs Feb 2024 and is not sure if that is when she injured shoulder as she did not have pain at that time. Patient used CPM daily and wears sling only in public at this time, but is working on weaning from both. Patient endorses difficulty with sleeping and can average 5 hours per night when not in so much pain. Patient has undergone knee surgery (2023), back surgery (2024), and also currently has bilat carpal tunnel with n/t in fingers and hands.  PAIN:   NPRS scale: 4.5/10 Pain location: Lt shoulder/arm Pain description: all pain descriptors, but most  of the time achey Aggravating factors: moving a certain way, reaching, trying to do hair, picking something up   Relieving factors: tylenol  arthritis twice a day, tramadol  as needed (mid morning, before bed), muscle relaxer, arnica herb (gel for bruising and pain), lidocaine  patches, ice    PRECAUTIONS:  Shoulder  RED FLAGS: None   WEIGHT BEARING RESTRICTIONS: Yes Lt shoulder   FALLS:  Has patient fallen in last 6 months? Yes. Number of falls 1, slipped in the bath tub   LIVING ENVIRONMENT: Lives with: lives with their family and lives with their son Lives in: House/apartment Stairs: Yes: Internal: 15 steps; on left going up and External: 3 steps; none Has following equipment at home: Single point cane, Quad cane small base, Walker - 2 wheeled, shower chair, Shower bench, and Grab bars  OCCUPATION: Retired; print production planner   PLOF: Independent and Independent with community mobility with device  PATIENT GOALS: to be able to use my arm again   OBJECTIVE:  Note: Objective measures were completed at Evaluation unless otherwise noted.  DIAGNOSTIC FINDINGS:    PATIENT SURVEYS :  PSFS: THE PATIENT SPECIFIC FUNCTIONAL SCALE  Place score of 0-10 (0 = unable to perform activity and 10 = able to perform activity at the same level as before injury or problem)  Activity Date: 03/20/2024  04/16/24 05/21/2024  Get dressed and putting shoes on  4 4 5   2. Brush and style your hair / showering  1 5 4   3. Cook  2 6 5   4. Washing dishes  1  4 6   5. Clean, sweep, mop  1 2 6   Total Score 1.8 4.2 5.2    Total Score = Sum of activity scores/number of activities  Minimally Detectable Change: 3 points (for single activity); 2 points (for average score)  [facility] (nd). The Patient Specific Functional Scale . Retrieved from [email or web address]   COGNITION: 03/20/2024 Overall cognitive status: Within functional limits for tasks  assessed     SENSATION: 03/20/2024 Light touch: diminished in fingers secondary to carpal tunnel Carpal tunnel leading to n/t in fingers;   POSTURE: 03/20/2024 Increased thoracic kyphosis, forward head, and forward rounded shoulders   UPPER EXTREMITY ROM:    ROM Right Eval 03/20/2024 Left Eval 03/20/2024 Left 04/04/24 Left 04/16/24 Lt   Shoulder flexion WFL 85 AAROM, highly painful A: 15  (supine) A: 112  (sitting) 140 (supine) P: 147 Standing AROM: 130deg  Shoulder extension       Shoulder abduction WFL 60 AAROM, highly painful  A: 85  (sitting) 101 (supine) P: 109 Standing AROM: 98deg  Shoulder adduction       Shoulder internal rotation T8 75 AAROM at 45deg abd in supine  P: 85 in 60 deg abdct   Shoulder external rotation T1 23 AAROM at 45deg abd in supine  P: 60 in 60 deg abdct   (Blank rows = not tested)  UPPER EXTREMITY MMT:  03/20/2024 unable to perform on eval due to protocol restrictions  MMT Right Eval 03/20/2024 Left Eval 03/20/2024 Right 04/24/2024 Left 04/24/2024  Shoulder flexion    2/5 c pain Unable to lift full range against gravity.   Shoulder extension      Shoulder abduction      Shoulder adduction      Shoulder internal rotation      Shoulder external rotation      Middle trapezius      Lower trapezius      Elbow flexion      Elbow extension      Wrist flexion      Wrist extension      Wrist ulnar deviation      Wrist radial deviation      Wrist pronation      Wrist supination  Grip strength (lbs)      (Blank rows = not tested)  SHOULDER SPECIAL TESTS: 03/20/2024 Did not perform on eval   JOINT MOBILITY TESTING:  03/20/2024 Not assessed on eval   PALPATION:  03/20/2024 Not assessed on eval                   TREATMENT 05/30/2024 ***  05/27/2024 TherEx:  UBE bilat UE level 3 for 4 min fwd/4 min back    Wall ladder into shoulder abduction and flexion 1x20 each  Supine horizontal abduction with yellow TB 1x15, red TB 1x15  Sidelying  abduction with 1# DB 1x15  Sidelying ER with 1# DB 1x15  Seated bicep curls 1x8 each direction (hand in supine, neutral, then prone)  Seated shoulder flexion with 2# Dbs 1x10   05/23/2024 TherEx:  UBE bilat UE level 3 for 4 min fwd/4 min back Wall ladder into shoulder abduction 2x15 Wall ladder into shoulder flexion 2x15  Seated bicep curls with 2# DBs 2x12  Seated flexion with 2# DBs 2x8 Seated abduction with 2# DB 2x6 Seated D2 with no weight 2x8   05/21/2024 TherEx:  UBE bilat UE level 3 for 4 min fwd/4 min back Wall ladder into shoulder abduction x10  Wall ladder into shoulder flexion x10  Seated bicep curls with 2# bar 2x20  Neuro Re-Ed:  Standing D1 PNF pattern 1x10 with no weight  Standing D2 PNF pattern 1x8 with no weight  Standing rows with blue TB 2x10 with 2s hold Standing extension with blue TB 2x10 with 1-2s hold  Seated rhythmic stabilization with elbow flexed to 90deg with gentle perturbations in ER/IR/ext/flex (elbow flexion perturbation closer to elbow than wrist) 2x30s      PATIENT EDUCATION: Eval: Education details: HEP, POC, typical recovery from rotator cuff repair Person educated: Patient and Child(ren) Education method: Explanation, Demonstration, Verbal cues, and Handouts Education comprehension: verbalized understanding  HOME EXERCISE PROGRAM: Access Code: [email or web address] URL: [email or web address] Date: 04/28/2024 Prepared by: Corean Ku  Exercises - Seated Scapular Retraction  - 1 x daily - 7 x weekly - 3 sets - 10 reps - Supine Shoulder Flexion Extension AAROM with Dowel  - 2-3 x daily - 7 x weekly - 1-2 sets - 10-15 reps - 3 hold - Supine Shoulder Circles  - 2-3 x daily - 7 x weekly - 1-2 sets - 20-30 reps - Standing Isometric Shoulder External Rotation with Doorway  - 1-2 x daily - 7 x weekly - 1 sets - 10 reps - 5-10 hold - Standing Isometric Shoulder Flexion with Doorway - Arm Bent  - 1-2 x daily - 7 x weekly - 1 sets - 10  reps - 5-10 hold - Standing Isometric Shoulder Abduction with Doorway - Arm Bent  - 1-2 x daily - 7 x weekly - 1 sets - 10 reps - 5-10 hold  ASSESSMENT:  CLINICAL IMPRESSION:  *** Patient arrived to session noting continued overall soreness from lower back down to feet. Patient tolerated all activities with one instance of muscle spasms when performing sidelying shoulder abduction that resolved with rest and self-massage. Patient will continue to benefit from skilled PT.    OBJECTIVE IMPAIRMENTS: decreased activity tolerance, decreased coordination, decreased endurance, decreased mobility, decreased ROM, decreased strength, decreased safety awareness, hypomobility, increased edema, impaired flexibility, impaired sensation, impaired UE functional use, improper body mechanics, postural dysfunction, obesity, and pain.   ACTIVITY LIMITATIONS: carrying, lifting, sleeping, bed mobility, bathing, dressing, reach over head, and hygiene/grooming  PARTICIPATION  LIMITATIONS: meal prep, cleaning, driving, community activity, and yard work  PERSONAL FACTORS: Past/current experiences, Time since onset of injury/illness/exacerbation, and 3+ comorbidities: HTN, hyperlipidemia, GERD, fibromyalgia, COPD, dyspnea, cataracts, chronic obstructive bronchitis, asthma, arthritis, history of sleep apnea are also affecting patient's functional outcome.   REHAB POTENTIAL: Good  CLINICAL DECISION MAKING: Evolving/moderate complexity  EVALUATION COMPLEXITY: Moderate  GOALS: Goals reviewed with patient? Yes  SHORT TERM GOALS: Target date: 04/24/2024  Patient will show compliance with initial HEP. Baseline: Goal status: MET 04/16/24  2.  Patient will report overall pain levels no greater than 5/10. Baseline:  Goal status: MET 05/21/2024  3.  Patient will gain full PROM and AAROM of Lt shoulder to improve functional mobility. Baseline:  Goal status: ongoing 05/21/2024  4.  Patient will increase PSFS to at least  3.8 to show a significant improvement to subjective disability rating.  Baseline:  Goal status: MET 04/16/24   LONG TERM GOALS: Target date: 06/12/2024  Patient will be independent with final HEP in order to maintain and progress upon functional gains made within PT. Baseline:  Goal status: ongoing 05/21/2024  2.  Patient will report overall pain levels no greater than 2/10. Baseline:  Goal status: ongoing 05/21/2024  3.   Patient will increase PSFS to at least 5.8 to show a significant improvement to subjective disability rating.  Baseline:  Goal status: ongoing 05/21/2024  4.  Patient will return to full AROM of Lt shoulder in order to improve overall functional mobility.  Baseline:  Goal status: ongoing 05/21/2024  5.  Patient will show strength in all motions in Lt shoulder to at least 4-/5 in order to show improved biomechanics with functional mobility.  Baseline:  Goal status: ongoing 05/21/2024  6.  Patient will report improvement in overall sleep through the night in order to show improved overall quality of life. Baseline:  Goal status: ongoing 05/21/2024  PLAN: PT FREQUENCY: 2-3x/wk for 6 weeks, 1-2x/wk for 6 weeks   PT DURATION: 12 weeks  PLANNED INTERVENTIONS: 97164- PT Re-evaluation, 97750- Physical Performance Testing, 97110-Therapeutic exercises, 97530- Therapeutic activity, [id number]- Neuromuscular re-education, 97535- Self Care, [id number]- Manual therapy, [id number]- Gait training, [phone]- Canalith repositioning, [id number]- Electrical stimulation (unattended), [phone]- Electrical stimulation (manual), 97016- Vasopneumatic device, [id number]- Ultrasound, [id number]- Traction (mechanical), [id number]- Ionotophoresis 4mg /ml Dexamethasone , 79439 (1-2 muscles), [id number] (3+ muscles)- Dry Needling, Patient/Family education, Balance training, Stair training, Taping, Joint mobilization, Joint manipulation, Spinal manipulation, Spinal mobilization, Scar mobilization, Vestibular training, DME instructions,  Cryotherapy, and Moist heat  PLAN FOR NEXT SESSION:     *** functional IR stretch, AAROM to AROM progression, avoiding shrug - use gravity reduced positioning.    NEXT MD VISIT: 05/30/24  Susannah Daring, PT, DPT 05/29/24 8:23 AM

## 2024-05-30 ENCOUNTER — Encounter: Payer: Self-pay | Admitting: Surgical

## 2024-05-30 ENCOUNTER — Encounter

## 2024-05-30 ENCOUNTER — Ambulatory Visit (INDEPENDENT_AMBULATORY_CARE_PROVIDER_SITE_OTHER): Admitting: Surgical

## 2024-05-30 ENCOUNTER — Other Ambulatory Visit (INDEPENDENT_AMBULATORY_CARE_PROVIDER_SITE_OTHER): Payer: Self-pay

## 2024-05-30 DIAGNOSIS — M5416 Radiculopathy, lumbar region: Secondary | ICD-10-CM

## 2024-05-30 NOTE — Addendum Note (Signed)
 Addended by: TRINDA DEANE HERO on: 05/30/2024 03:09 PM   Modules accepted: Orders

## 2024-05-30 NOTE — Progress Notes (Signed)
 Post-Op Visit Note   Patient: Frances Nelson           Date of Birth: 22-Jul-1955           MRN: [id number] Visit Date: 05/30/2024 PCP: Norleen Lynwood ORN, MD   Assessment & Plan:  Chief Complaint:  Chief Complaint  Patient presents with   Left Shoulder - Routine Post Op    02/21/2024 left shoulder arthroscopy with debridement, BT, RCR wth a 2x3 construct   Visit Diagnoses:  1. Lumbar radiculopathy     Plan: Patient is a 68 year old female who presents s/p left rotator cuff repair by Dr. Addie on 02/21/2024.  Doing okay overall with her left shoulder.  Still has pain and strength is not back to 100% yet but patient is continuing making progress and has not plateaued yet.  She has about 2 more sessions with PT.  She is continuing with home exercise program that she does daily.  Describes left shoulder pain in the deltoid region with occasional radiation to the elbow.  Also has left-sided trapezius pain and bilateral scapular pain and neck symptoms.  On exam, patient has about 45 degrees X rotation, 90 degrees abduction, 135 degrees forward elevation passively and actively with the left shoulder.  No grinding or crepitus concerning for retear of the rotator cuff.  Incisions are well-healed.  Axillary nerve intact with deltoid firing.  Good rotator cuff strength on exam today.  She does have some slight weakness of abduction and tricep extension compared with the contralateral side.  Rest of her motor strength is excellent of the bilateral upper extremities.  Plan at this time is continue with physical therapy and avoiding lifting out away from her body or lifting overhead.  Patient also reports low back pain today.  She had prior L4-L5 fusion with Dr. Rockney from [facility].  He has since left the practice.  She states that she has low back pain with bilateral leg radicular symptoms that travel through the buttock to the groin and thigh and down to the knees.  Occasionally will radiate down  as low as the distal calf/ankle.  No bowel or bladder incontinence or saddle anesthesia.  No injury.  She has noticed the symptoms over the last month and they are worsening.  Causing difficulty with sleeping.  She has associated numbness and tingling in the same distribution of her pain.  On exam, patient has decreased Achilles tendon reflexes bilaterally rated 1+.  Patellar tendon reflexes rated 2+ bilaterally.  No clonus noted bilaterally.  She has intact hip flexion, quadricep, hamstring strength rated 5 -/5 with 5/5 dorsiflexion, plantarflexion, EHL strength.  Radiographs demonstrate intact fusion at L4-L5 without complication.  She has prior MRI of the lumbar spine from about 2.5 years ago that demonstrated pathology at L4-L5.  Had that level fused.  Now may be symptomatic at the level above given her distribution of radicular symptoms..  After discussion of options, plan for further evaluation with new MRI of the lumbar spine and likely referral to Dr. Georgina.  She states that historically ESI's have not given her any lasting relief.  Follow-Up Instructions: No follow-ups on file.   Orders:  Orders Placed This Encounter  Procedures   XR Lumbar Spine 2-3 Views   No orders of the defined types were placed in this encounter.   Imaging: No results found.  PMFS History: Patient Active Problem List   Diagnosis Date Noted   Nail disorder 04/05/2024   Synovitis of  left shoulder 03/09/2024   Biceps tendonitis on left 03/09/2024   Degenerative superior labral anterior-to-posterior (SLAP) tear of left shoulder 03/09/2024   Complete tear of left rotator cuff 03/09/2024   Obesity 10/13/2023   Shingles outbreak 05/29/2023   Microhematuria 04/09/2023   Bilateral shoulder pain 04/09/2023   Urethritis 10/05/2022   External otitis of left ear 10/05/2022   Spondylolisthesis of lumbar region 05/18/2022   Smoker 04/08/2022   Deviated nasal septum 04/08/2022   Bilateral pain of leg and foot  04/08/2022   Follicular acne 04/06/2022   Baker cyst, left 04/06/2022   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   Depression 10/04/2021   Left knee pain 10/04/2021   Chronic pain 04/09/2021   Pelvic pain 04/09/2021   HLD (hyperlipidemia) 04/09/2021   Vitamin D  deficiency 04/09/2021   Estrogen deficiency 04/09/2021   Hyperglycemia 04/01/2021   Chronic cough 12/30/2020   GERD (gastroesophageal reflux disease) 11/06/2019   Bilateral leg edema 10/16/2019   Pleuritic chest pain 10/16/2019   Hypertension 10/02/2019   Arthralgia 06/28/2018   Sinusitis 06/28/2018   Renal cyst 05/03/2018   Syncope 05/03/2018   Stress and adjustment reaction 05/03/2018   History of tobacco use 05/02/2018   Primary osteoarthritis of both hands 04/10/2018   Primary osteoarthritis of both knees 04/10/2018   Primary osteoarthritis of both feet 04/10/2018   Achilles tendinitis 04/02/2018   Screening for breast cancer 03/15/2018   Positive ANA (antinuclear antibody) 03/15/2018   Screening for colon cancer 03/15/2018   Rash 03/15/2018   Achilles tendon pain 11/13/2017   Cervical myelopathy with cervical radiculopathy (HCC) 11/13/2017   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   Allergic rhinitis 06/19/2017   Edema 12/24/2015   Carpal tunnel syndrome, right 12/16/2015   Hypercalcemia 11/11/2015   Pain in both upper extremities 11/11/2015   Drug reaction 10/28/2015   Muscle spasms of both lower extremities 10/28/2015   Nerve pain 10/28/2015   Chronic asthmatic bronchitis with acute exacerbation (HCC) 09/06/2015   Environmental and seasonal allergies 07/17/2014   Past Medical History:  Diagnosis Date   Allergy 01.01.1975   Ambulates with cane    straight cane   Arthritis    Asthma    Bronchitis, obstructive, chronic (HCC)    hx   Cataracts, bilateral    pt has not had surgery to remove as of 02/15/24   Complication of anesthesia    patient states slow to wake up after  anesthesia   COPD (chronic obstructive pulmonary disease) (HCC)    Dyspnea    only with exertion   Fibromyalgia    GERD (gastroesophageal reflux disease)    Headache    HLD (hyperlipidemia)    Hypertension    Pneumonia    x several    Family History  Problem Relation Age of Onset   Arthritis Mother    COPD Mother    Depression Mother    Hearing loss Mother    Angina Father    Parkinson's disease Father    Hearing loss Father    Heart disease Father    Leukemia Sister    Kidney disease Sister    Kidney disease Brother    Brain cancer Brother        glioblastoma    Cancer Brother    Allergies Son    Asthma Son    Allergies Son    Drug abuse Son    Early death Son    Asthma Son    Cancer  Sister    Cancer Brother    Asthma Son    Cancer Brother    Cancer Sister     Past Surgical History:  Procedure Laterality Date   ABDOMINAL HYSTERECTOMY  8.19.1990   APPENDECTOMY  9.1.1980   BICEPT TENODESIS Left 02/21/2024   Procedure: TENODESIS, BICEPS;  Surgeon: Addie Cordella Hamilton, MD;  [facility];  Service: Orthopedics;  Laterality: Left;   CYST EXCISION     upper right side lateral - benign   HERNIA REPAIR     Umbilical x 3   KNEE ARTHROSCOPY W/ MENISCAL REPAIR Left 12/2022   POSTERIOR LUMBAR FUSION 2 WITH HARDWARE REMOVAL Left 02/21/2024   Procedure: ARTHROSCOPY, SHOULDER WITH DEBRIDEMENT;  Surgeon: Addie Cordella Hamilton, MD;  Location: [facility] OR;  Service: Orthopedics;  Laterality: Left;  left shoulder arthroscopy, debridement, biceps tenodesis, mini open rotator cuff tear repair   SHOULDER OPEN ROTATOR CUFF REPAIR Left 02/21/2024   Procedure: REPAIR, ROTATOR CUFF, OPEN;  Surgeon: Addie Cordella Hamilton, MD;  Location: [facility] OR;  Service: Orthopedics;  Laterality: Left;   SPINE SURGERY  05/2021   TONSILLECTOMY     TRANSFORAMINAL LUMBAR INTERBODY FUSION (TLIF) WITH PEDICLE SCREW FIXATION 1 LEVEL N/A 05/18/2022   Procedure: Lumbar Four-Five Open Laminectomy/Transforaminal Lumbar  Interbody Fusion/Posterolateral fusion;  Surgeon: Cheryle Debby LABOR, MD;  Location: [facility] OR;  Service: Neurosurgery;  Laterality: N/A;   Social History   Occupational History   Occupation: RETIRED  Tobacco Use   Smoking status: Every Day    Current packs/day: 1.00    Average packs/day: 1 pack/day for 40.9 years (40.9 ttl pk-yrs)    Types: Cigarettes    Start date: 15    Last attempt to quit: 2000    Passive exposure: Never   Smokeless tobacco: Never   Tobacco comments:    1 pack a day    01/23/24-Quit several times totaling 10 yrs but still currently smoking 1 ppd  Vaping Use   Vaping status: Never Used  Substance and Sexual Activity   Alcohol use: No   Drug use: Yes    Types: Amphetamines    Comment: on phentermine  for weight loss   Sexual activity: Not Currently    Birth control/protection: Surgical    Comment: Hysterectomy

## 2024-06-02 NOTE — Therapy (Signed)
 OUTPATIENT PHYSICAL THERAPY TREATMENT    Patient Name: Frances Nelson MRN: [id number] DOB:1956-04-27, 68 y.o., female Today's Date: 06/03/2024  END OF SESSION:  PT End of Session - 06/03/24 1145     Visit Number 23    Number of Visits 30    Date for Recertification  06/12/24    Authorization Type [employer] AND [employer] [place]    Progress Note Due on Visit 20    PT Start Time 1146    PT Stop Time 1226    PT Time Calculation (min) 40 min    Activity Tolerance Patient limited by pain    Behavior During Therapy [facility] for tasks assessed/performed           Past Medical History:  Diagnosis Date   Allergy 01.01.1975   Ambulates with cane    straight cane   Arthritis    Asthma    Bronchitis, obstructive, chronic (HCC)    hx   Cataracts, bilateral    pt has not had surgery to remove as of 02/15/24   Complication of anesthesia    patient states slow to wake up after anesthesia   COPD (chronic obstructive pulmonary disease) (HCC)    Dyspnea    only with exertion   Fibromyalgia    GERD (gastroesophageal reflux disease)    Headache    HLD (hyperlipidemia)    Hypertension    Pneumonia    x several   Past Surgical History:  Procedure Laterality Date   ABDOMINAL HYSTERECTOMY  8.19.1990   APPENDECTOMY  9.1.1980   BICEPT TENODESIS Left 02/21/2024   Procedure: TENODESIS, BICEPS;  Surgeon: Addie Cordella Hamilton, MD;  Location: [facility] OR;  Service: Orthopedics;  Laterality: Left;   CYST EXCISION     upper right side lateral - benign   HERNIA REPAIR     Umbilical x 3   KNEE ARTHROSCOPY W/ MENISCAL REPAIR Left 12/2022   POSTERIOR LUMBAR FUSION 2 WITH HARDWARE REMOVAL Left 02/21/2024   Procedure: ARTHROSCOPY, SHOULDER WITH DEBRIDEMENT;  Surgeon: Addie Cordella Hamilton, MD;  Location: [facility] OR;  Service: Orthopedics;  Laterality: Left;  left shoulder arthroscopy, debridement, biceps tenodesis, mini open rotator cuff tear repair   SHOULDER OPEN ROTATOR CUFF REPAIR Left 02/21/2024    Procedure: REPAIR, ROTATOR CUFF, OPEN;  Surgeon: Addie Cordella Hamilton, MD;  Location: [facility] OR;  Service: Orthopedics;  Laterality: Left;   SPINE SURGERY  05/2021   TONSILLECTOMY     TRANSFORAMINAL LUMBAR INTERBODY FUSION (TLIF) WITH PEDICLE SCREW FIXATION 1 LEVEL N/A 05/18/2022   Procedure: Lumbar Four-Five Open Laminectomy/Transforaminal Lumbar Interbody Fusion/Posterolateral fusion;  Surgeon: Cheryle Debby LABOR, MD;  Location: [facility] OR;  Service: Neurosurgery;  Laterality: N/A;   Patient Active Problem List   Diagnosis Date Noted   Nail disorder 04/05/2024   Synovitis of left shoulder 03/09/2024   Biceps tendonitis on left 03/09/2024   Degenerative superior labral anterior-to-posterior (SLAP) tear of left shoulder 03/09/2024   Complete tear of left rotator cuff 03/09/2024   Obesity 10/13/2023   Shingles outbreak 05/29/2023   Microhematuria 04/09/2023   Bilateral shoulder pain 04/09/2023   Urethritis 10/05/2022   External otitis of left ear 10/05/2022   Spondylolisthesis of lumbar region 05/18/2022   Smoker 04/08/2022   Deviated nasal septum 04/08/2022   Bilateral pain of leg and foot 04/08/2022   Follicular acne 04/06/2022   Baker cyst, left 04/06/2022   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   Depression 10/04/2021   Left knee pain  10/04/2021   Chronic pain 04/09/2021   Pelvic pain 04/09/2021   HLD (hyperlipidemia) 04/09/2021   Vitamin D  deficiency 04/09/2021   Estrogen deficiency 04/09/2021   Hyperglycemia 04/01/2021   Chronic cough 12/30/2020   GERD (gastroesophageal reflux disease) 11/06/2019   Bilateral leg edema 10/16/2019   Pleuritic chest pain 10/16/2019   Hypertension 10/02/2019   Arthralgia 06/28/2018   Sinusitis 06/28/2018   Renal cyst 05/03/2018   Syncope 05/03/2018   Stress and adjustment reaction 05/03/2018   History of tobacco use 05/02/2018   Primary osteoarthritis of both hands 04/10/2018   Primary osteoarthritis of both  knees 04/10/2018   Primary osteoarthritis of both feet 04/10/2018   Achilles tendinitis 04/02/2018   Screening for breast cancer 03/15/2018   Positive ANA (antinuclear antibody) 03/15/2018   Screening for colon cancer 03/15/2018   Rash 03/15/2018   Achilles tendon pain 11/13/2017   Cervical myelopathy with cervical radiculopathy (HCC) 11/13/2017   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   Allergic rhinitis 06/19/2017   Edema 12/24/2015   Carpal tunnel syndrome, right 12/16/2015   Hypercalcemia 11/11/2015   Pain in both upper extremities 11/11/2015   Drug reaction 10/28/2015   Muscle spasms of both lower extremities 10/28/2015   Nerve pain 10/28/2015   Chronic asthmatic bronchitis with acute exacerbation (HCC) 09/06/2015   Environmental and seasonal allergies 07/17/2014    PCP: Lynwood LELON Rush, MD   REFERRING PROVIDER: Cordella Glendia Hutchinson, MD   REFERRING DIAG: [phone] (ICD-10-CM) - S/P rotator cuff repair  THERAPY DIAG:  Chronic left shoulder pain  Stiffness of left shoulder, not elsewhere classified  Muscle weakness (generalized)  Localized edema  Other abnormalities of gait and mobility  Other muscle spasm  Rationale for Evaluation and Treatment: Rehabilitation  ONSET DATE: 02/21/2024 left shoulder arthroscopy with debridement biceps tenodesis rotator cuff tear repair with a 2 x 3 construct  SUBJECTIVE:                                                                                                                                                                                      SUBJECTIVE STATEMENT: Patient reports being scheduled for an MRI on lumbar spine in December.    Hand dominance: Right  PERTINENT HISTORY: Patient endorsed having gone through PT and multiple shoulder injection prior to appointment with Dr. Hutchinson, who scheduled and performed her surgery. Patient notes that her original pain started in Feb 2025, though did have a fall down stairs Feb 2024 and is  not sure if that is when she injured shoulder as she did not have pain at that time. Patient used CPM daily and wears sling only in public at this  time, but is working on weaning from both. Patient endorses difficulty with sleeping and can average 5 hours per night when not in so much pain. Patient has undergone knee surgery (2023), back surgery (2024), and also currently has bilat carpal tunnel with n/t in fingers and hands.  PAIN:   NPRS scale: 4/10 Pain location: Lt shoulder/arm Pain description: all pain descriptors, but most of the time achey Aggravating factors: moving a certain way, reaching, trying to do hair, picking something up   Relieving factors: tylenol  arthritis twice a day, tramadol  as needed (mid morning, before bed), muscle relaxer, arnica herb (gel for bruising and pain), lidocaine  patches, ice    PRECAUTIONS: Shoulder  RED FLAGS: None   WEIGHT BEARING RESTRICTIONS: Yes Lt shoulder   FALLS:  Has patient fallen in last 6 months? Yes. Number of falls 1, slipped in the bath tub   LIVING ENVIRONMENT: Lives with: lives with their family and lives with their son Lives in: House/apartment Stairs: Yes: Internal: 15 steps; on left going up and External: 3 steps; none Has following equipment at home: Single point cane, Quad cane small base, Walker - 2 wheeled, shower chair, Shower bench, and Grab bars  OCCUPATION: Retired; print production planner   PLOF: Independent and Independent with community mobility with device  PATIENT GOALS: to be able to use my arm again   OBJECTIVE:  Note: Objective measures were completed at Evaluation unless otherwise noted.  DIAGNOSTIC FINDINGS:    PATIENT SURVEYS :  PSFS: THE PATIENT SPECIFIC FUNCTIONAL SCALE  Place score of 0-10 (0 = unable to perform activity and 10 = able to perform activity at the same level as before injury or problem)  Activity Date: 03/20/2024  04/16/24 05/21/2024  Get dressed and putting shoes on  4 4 5   2. Brush and  style your hair / showering  1 5 4   3. Cook  2 6 5   4. Washing dishes  1  4 6   5. Clean, sweep, mop  1 2 6   Total Score 1.8 4.2 5.2    Total Score = Sum of activity scores/number of activities  Minimally Detectable Change: 3 points (for single activity); 2 points (for average score)  [facility] (nd). The Patient Specific Functional Scale . Retrieved from [email or web address]   COGNITION: 03/20/2024 Overall cognitive status: Within functional limits for tasks assessed     SENSATION: 03/20/2024 Light touch: diminished in fingers secondary to carpal tunnel Carpal tunnel leading to n/t in fingers;   POSTURE: 03/20/2024 Increased thoracic kyphosis, forward head, and forward rounded shoulders   UPPER EXTREMITY ROM:    ROM Right Eval 03/20/2024 Left Eval 03/20/2024 Left 04/04/24 Left 04/16/24 Lt   Shoulder flexion WFL 85 AAROM, highly painful A: 15  (supine) A: 112  (sitting) 140 (supine) P: 147 Standing AROM: 130deg  Shoulder extension       Shoulder abduction WFL 60 AAROM, highly painful  A: 85  (sitting) 101 (supine) P: 109 Standing AROM: 98deg  Shoulder adduction       Shoulder internal rotation T8 75 AAROM at 45deg abd in supine  P: 85 in 60 deg abdct   Shoulder external rotation T1 23 AAROM at 45deg abd in supine  P: 60 in 60 deg abdct   (Blank rows = not tested)  UPPER EXTREMITY MMT:  03/20/2024 unable to perform on eval due to protocol restrictions  MMT Right Eval 03/20/2024 Left Eval 03/20/2024 Right 04/24/2024 Left 04/24/2024  Shoulder flexion  2/5 c pain Unable to lift full range against gravity.   Shoulder extension      Shoulder abduction      Shoulder adduction      Shoulder internal rotation      Shoulder external rotation      Middle trapezius      Lower trapezius      Elbow flexion      Elbow extension      Wrist flexion      Wrist extension      Wrist ulnar deviation      Wrist radial  deviation      Wrist pronation      Wrist supination      Grip strength (lbs)      (Blank rows = not tested)  SHOULDER SPECIAL TESTS: 03/20/2024 Did not perform on eval   JOINT MOBILITY TESTING:  03/20/2024 Not assessed on eval   PALPATION:  03/20/2024 Not assessed on eval                   TREATMENT 06/03/2024 TherAct: Pulleys 3 min shoulder flexion, 3 min shoulder scaption/abduction  Wall ladder shoulder abduction 2x10   Extension with PVC behind back 1x10 with 5s hold  Horizontal adduction stretch 2x30s  Functional IR stretch with towel 2x30s   TherEx:  Standing rows with blue TB 2x15 with 2s hold  Standing tricep extension with green TB 2x15 with verbal and tactile cues provided for appropriate form   05/27/2024 TherEx:  UBE bilat UE level 3 for 4 min fwd/4 min back    Wall ladder into shoulder abduction and flexion 1x20 each  Supine horizontal abduction with yellow TB 1x15, red TB 1x15  Sidelying abduction with 1# DB 1x15  Sidelying ER with 1# DB 1x15  Seated bicep curls 1x8 each direction (hand in supine, neutral, then prone)  Seated shoulder flexion with 2# Dbs 1x10   05/23/2024 TherEx:  UBE bilat UE level 3 for 4 min fwd/4 min back Wall ladder into shoulder abduction 2x15 Wall ladder into shoulder flexion 2x15  Seated bicep curls with 2# DBs 2x12  Seated flexion with 2# DBs 2x8 Seated abduction with 2# DB 2x6 Seated D2 with no weight 2x8   05/21/2024 TherEx:  UBE bilat UE level 3 for 4 min fwd/4 min back Wall ladder into shoulder abduction x10  Wall ladder into shoulder flexion x10  Seated bicep curls with 2# bar 2x20  Neuro Re-Ed:  Standing D1 PNF pattern 1x10 with no weight  Standing D2 PNF pattern 1x8 with no weight  Standing rows with blue TB 2x10 with 2s hold Standing extension with blue TB 2x10 with 1-2s hold  Seated rhythmic stabilization with elbow flexed to 90deg with gentle perturbations in ER/IR/ext/flex (elbow flexion perturbation closer to  elbow than wrist) 2x30s      PATIENT EDUCATION: Eval: Education details: HEP, POC, typical recovery from rotator cuff repair Person educated: Patient and Child(ren) Education method: Explanation, Demonstration, Verbal cues, and Handouts Education comprehension: verbalized understanding  HOME EXERCISE PROGRAM: Access Code: [email or web address] URL: [email or web address] Date: 04/28/2024 Prepared by: Corean Ku  Exercises - Seated Scapular Retraction  - 1 x daily - 7 x weekly - 3 sets - 10 reps - Supine Shoulder Flexion Extension AAROM with Dowel  - 2-3 x daily - 7 x weekly - 1-2 sets - 10-15 reps - 3 hold - Supine Shoulder Circles  - 2-3 x daily - 7 x weekly - 1-2 sets - 20-30 reps -  Standing Isometric Shoulder External Rotation with Doorway  - 1-2 x daily - 7 x weekly - 1 sets - 10 reps - 5-10 hold - Standing Isometric Shoulder Flexion with Doorway - Arm Bent  - 1-2 x daily - 7 x weekly - 1 sets - 10 reps - 5-10 hold - Standing Isometric Shoulder Abduction with Doorway - Arm Bent  - 1-2 x daily - 7 x weekly - 1 sets - 10 reps - 5-10 hold  ASSESSMENT:  CLINICAL IMPRESSION:  Patient arrived to session noting continued difficulty secondary to low back pain and radiculopathy, though shoulder continues to make improvements. Patient tolerated all activities this date with intermittent increase in soreness during flexibility/ADL exercises. Patient will continue to benefit from skilled PT.    OBJECTIVE IMPAIRMENTS: decreased activity tolerance, decreased coordination, decreased endurance, decreased mobility, decreased ROM, decreased strength, decreased safety awareness, hypomobility, increased edema, impaired flexibility, impaired sensation, impaired UE functional use, improper body mechanics, postural dysfunction, obesity, and pain.   ACTIVITY LIMITATIONS: carrying, lifting, sleeping, bed mobility, bathing, dressing, reach over head, and hygiene/grooming  PARTICIPATION  LIMITATIONS: meal prep, cleaning, driving, community activity, and yard work  PERSONAL FACTORS: Past/current experiences, Time since onset of injury/illness/exacerbation, and 3+ comorbidities: HTN, hyperlipidemia, GERD, fibromyalgia, COPD, dyspnea, cataracts, chronic obstructive bronchitis, asthma, arthritis, history of sleep apnea are also affecting patient's functional outcome.   REHAB POTENTIAL: Good  CLINICAL DECISION MAKING: Evolving/moderate complexity  EVALUATION COMPLEXITY: Moderate  GOALS: Goals reviewed with patient? Yes  SHORT TERM GOALS: Target date: 04/24/2024  Patient will show compliance with initial HEP. Baseline: Goal status: MET 04/16/24  2.  Patient will report overall pain levels no greater than 5/10. Baseline:  Goal status: MET 05/21/2024  3.  Patient will gain full PROM and AAROM of Lt shoulder to improve functional mobility. Baseline:  Goal status: ongoing 05/21/2024  4.  Patient will increase PSFS to at least 3.8 to show a significant improvement to subjective disability rating.  Baseline:  Goal status: MET 04/16/24   LONG TERM GOALS: Target date: 06/12/2024  Patient will be independent with final HEP in order to maintain and progress upon functional gains made within PT. Baseline:  Goal status: ongoing 05/21/2024  2.  Patient will report overall pain levels no greater than 2/10. Baseline:  Goal status: ongoing 05/21/2024  3.   Patient will increase PSFS to at least 5.8 to show a significant improvement to subjective disability rating.  Baseline:  Goal status: ongoing 05/21/2024  4.  Patient will return to full AROM of Lt shoulder in order to improve overall functional mobility.  Baseline:  Goal status: ongoing 05/21/2024  5.  Patient will show strength in all motions in Lt shoulder to at least 4-/5 in order to show improved biomechanics with functional mobility.  Baseline:  Goal status: ongoing 05/21/2024  6.  Patient will report improvement in  overall sleep through the night in order to show improved overall quality of life. Baseline:  Goal status: ongoing 05/21/2024  PLAN: PT FREQUENCY: 2-3x/wk for 6 weeks, 1-2x/wk for 6 weeks   PT DURATION: 12 weeks  PLANNED INTERVENTIONS: [id number]- PT Re-evaluation, 97750- Physical Performance Testing, 97110-Therapeutic exercises, 97530- Therapeutic activity, [id number]- Neuromuscular re-education, 97535- Self Care, [id number]- Manual therapy, [id number]- Gait training, [phone]- Canalith repositioning, [id number]- Electrical stimulation (unattended), [id number]- Electrical stimulation (manual), [id number]- Vasopneumatic device, [id number]- Ultrasound, [id number]- Traction (mechanical), [id number]- Ionotophoresis 4mg /ml Dexamethasone , 79439 (1-2 muscles), [id number] (3+ muscles)- Dry Needling, Patient/Family education, Balance training, Stair training, Taping, Joint mobilization, Joint manipulation,  Spinal manipulation, Spinal mobilization, Scar mobilization, Vestibular training, DME instructions, Cryotherapy, and Moist heat  PLAN FOR NEXT SESSION:      functional IR stretch, AAROM to AROM progression, avoiding shrug - use gravity reduced positioning.    NEXT MD VISIT:   Susannah Daring, PT, DPT 06/03/24 12:29 PM

## 2024-06-03 ENCOUNTER — Ambulatory Visit

## 2024-06-03 DIAGNOSIS — M62838 Other muscle spasm: Secondary | ICD-10-CM

## 2024-06-03 DIAGNOSIS — R2689 Other abnormalities of gait and mobility: Secondary | ICD-10-CM

## 2024-06-03 DIAGNOSIS — M6281 Muscle weakness (generalized): Secondary | ICD-10-CM

## 2024-06-03 DIAGNOSIS — M25512 Pain in left shoulder: Secondary | ICD-10-CM

## 2024-06-03 DIAGNOSIS — M25612 Stiffness of left shoulder, not elsewhere classified: Secondary | ICD-10-CM

## 2024-06-03 DIAGNOSIS — G8929 Other chronic pain: Secondary | ICD-10-CM

## 2024-06-03 DIAGNOSIS — R600 Localized edema: Secondary | ICD-10-CM

## 2024-06-04 NOTE — Therapy (Signed)
 OUTPATIENT PHYSICAL THERAPY TREATMENT    Patient Name: Frances Nelson MRN: [id number] DOB:Jul 03, 1956, 68 y.o., female Today's Date: 06/05/2024  END OF SESSION:  PT End of Session - 06/05/24 1018     Visit Number 24    Number of Visits 30    Date for Recertification  06/12/24    Authorization Type [employer] AND [employer] [place]    Progress Note Due on Visit 20    PT Start Time 1018    PT Stop Time 1057    PT Time Calculation (min) 39 min    Activity Tolerance Patient limited by pain    Behavior During Therapy [facility] [place] for tasks assessed/performed            Past Medical History:  Diagnosis Date   Allergy 01.01.1975   Ambulates with cane    straight cane   Arthritis    Asthma    Bronchitis, obstructive, chronic (HCC)    hx   Cataracts, bilateral    pt has not had surgery to remove as of 02/15/24   Complication of anesthesia    patient states slow to wake up after anesthesia   COPD (chronic obstructive pulmonary disease) (HCC)    Dyspnea    only with exertion   Fibromyalgia    GERD (gastroesophageal reflux disease)    Headache    HLD (hyperlipidemia)    Hypertension    Pneumonia    x several   Past Surgical History:  Procedure Laterality Date   ABDOMINAL HYSTERECTOMY  8.19.1990   APPENDECTOMY  9.1.1980   BICEPT TENODESIS Left 02/21/2024   Procedure: TENODESIS, BICEPS;  Surgeon: Addie Cordella Hamilton, MD;  Location: [facility] OR;  Service: Orthopedics;  Laterality: Left;   CYST EXCISION     upper right side lateral - benign   HERNIA REPAIR     Umbilical x 3   KNEE ARTHROSCOPY W/ MENISCAL REPAIR Left 12/2022   POSTERIOR LUMBAR FUSION 2 WITH HARDWARE REMOVAL Left 02/21/2024   Procedure: ARTHROSCOPY, SHOULDER WITH DEBRIDEMENT;  Surgeon: Addie Cordella Hamilton, MD;  Location: [facility] OR;  Service: Orthopedics;  Laterality: Left;  left shoulder arthroscopy, debridement, biceps tenodesis, mini open rotator cuff tear repair   SHOULDER OPEN ROTATOR CUFF REPAIR Left 02/21/2024    Procedure: REPAIR, ROTATOR CUFF, OPEN;  Surgeon: Addie Cordella Hamilton, MD;  Location: [facility] OR;  Service: Orthopedics;  Laterality: Left;   SPINE SURGERY  05/2021   TONSILLECTOMY     TRANSFORAMINAL LUMBAR INTERBODY FUSION (TLIF) WITH PEDICLE SCREW FIXATION 1 LEVEL N/A 05/18/2022   Procedure: Lumbar Four-Five Open Laminectomy/Transforaminal Lumbar Interbody Fusion/Posterolateral fusion;  Surgeon: Cheryle Debby LABOR, MD;  Location: [facility] OR;  Service: Neurosurgery;  Laterality: N/A;   Patient Active Problem List   Diagnosis Date Noted   Nail disorder 04/05/2024   Synovitis of left shoulder 03/09/2024   Biceps tendonitis on left 03/09/2024   Degenerative superior labral anterior-to-posterior (SLAP) tear of left shoulder 03/09/2024   Complete tear of left rotator cuff 03/09/2024   Obesity 10/13/2023   Shingles outbreak 05/29/2023   Microhematuria 04/09/2023   Bilateral shoulder pain 04/09/2023   Urethritis 10/05/2022   External otitis of left ear 10/05/2022   Spondylolisthesis of lumbar region 05/18/2022   Smoker 04/08/2022   Deviated nasal septum 04/08/2022   Bilateral pain of leg and foot 04/08/2022   Follicular acne 04/06/2022   Baker cyst, left 04/06/2022   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   Depression 10/04/2021   Left knee  pain 10/04/2021   Chronic pain 04/09/2021   Pelvic pain 04/09/2021   HLD (hyperlipidemia) 04/09/2021   Vitamin D  deficiency 04/09/2021   Estrogen deficiency 04/09/2021   Hyperglycemia 04/01/2021   Chronic cough 12/30/2020   GERD (gastroesophageal reflux disease) 11/06/2019   Bilateral leg edema 10/16/2019   Pleuritic chest pain 10/16/2019   Hypertension 10/02/2019   Arthralgia 06/28/2018   Sinusitis 06/28/2018   Renal cyst 05/03/2018   Syncope 05/03/2018   Stress and adjustment reaction 05/03/2018   History of tobacco use 05/02/2018   Primary osteoarthritis of both hands 04/10/2018   Primary osteoarthritis of both  knees 04/10/2018   Primary osteoarthritis of both feet 04/10/2018   Achilles tendinitis 04/02/2018   Screening for breast cancer 03/15/2018   Positive ANA (antinuclear antibody) 03/15/2018   Screening for colon cancer 03/15/2018   Rash 03/15/2018   Achilles tendon pain 11/13/2017   Cervical myelopathy with cervical radiculopathy (HCC) 11/13/2017   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   Allergic rhinitis 06/19/2017   Edema 12/24/2015   Carpal tunnel syndrome, right 12/16/2015   Hypercalcemia 11/11/2015   Pain in both upper extremities 11/11/2015   Drug reaction 10/28/2015   Muscle spasms of both lower extremities 10/28/2015   Nerve pain 10/28/2015   Chronic asthmatic bronchitis with acute exacerbation (HCC) 09/06/2015   Environmental and seasonal allergies 07/17/2014    PCP: Lynwood LELON Rush, MD   REFERRING PROVIDER: Cordella Glendia Hutchinson, MD   REFERRING DIAG: [phone] (ICD-10-CM) - S/P rotator cuff repair  THERAPY DIAG:  Chronic left shoulder pain  Stiffness of left shoulder, not elsewhere classified  Muscle weakness (generalized)  Localized edema  Other abnormalities of gait and mobility  Other muscle spasm  Rationale for Evaluation and Treatment: Rehabilitation  ONSET DATE: 02/21/2024 left shoulder arthroscopy with debridement biceps tenodesis rotator cuff tear repair with a 2 x 3 construct  SUBJECTIVE:                                                                                                                                                                                      SUBJECTIVE STATEMENT: Patient reports overall improvement in generalized symptoms, though continues to have difficulty.    Hand dominance: Right  PERTINENT HISTORY: Patient endorsed having gone through PT and multiple shoulder injection prior to appointment with Dr. Hutchinson, who scheduled and performed her surgery. Patient notes that her original pain started in Feb 2025, though did have a fall down  stairs Feb 2024 and is not sure if that is when she injured shoulder as she did not have pain at that time. Patient used CPM daily and wears sling only in public at  this time, but is working on weaning from both. Patient endorses difficulty with sleeping and can average 5 hours per night when not in so much pain. Patient has undergone knee surgery (2023), back surgery (2024), and also currently has bilat carpal tunnel with n/t in fingers and hands.  PAIN:   NPRS scale: 4/10 Pain location: Lt shoulder/arm Pain description: all pain descriptors, but most of the time achey Aggravating factors: moving a certain way, reaching, trying to do hair, picking something up   Relieving factors: tylenol  arthritis twice a day, tramadol  as needed (mid morning, before bed), muscle relaxer, arnica herb (gel for bruising and pain), lidocaine  patches, ice    PRECAUTIONS: Shoulder  RED FLAGS: None   WEIGHT BEARING RESTRICTIONS: Yes Lt shoulder   FALLS:  Has patient fallen in last 6 months? Yes. Number of falls 1, slipped in the bath tub   LIVING ENVIRONMENT: Lives with: lives with their family and lives with their son Lives in: House/apartment Stairs: Yes: Internal: 15 steps; on left going up and External: 3 steps; none Has following equipment at home: Single point cane, Quad cane small base, Walker - 2 wheeled, shower chair, Shower bench, and Grab bars  OCCUPATION: Retired; print production planner   PLOF: Independent and Independent with community mobility with device  PATIENT GOALS: to be able to use my arm again   OBJECTIVE:  Note: Objective measures were completed at Evaluation unless otherwise noted.  DIAGNOSTIC FINDINGS:    PATIENT SURVEYS :  PSFS: THE PATIENT SPECIFIC FUNCTIONAL SCALE  Place score of 0-10 (0 = unable to perform activity and 10 = able to perform activity at the same level as before injury or problem)  Activity Date: 03/20/2024  04/16/24 05/21/2024  Get dressed and putting shoes on   4 4 5   2. Brush and style your hair / showering  1 5 4   3. Cook  2 6 5   4. Washing dishes  1  4 6   5. Clean, sweep, mop  1 2 6   Total Score 1.8 4.2 5.2    Total Score = Sum of activity scores/number of activities  Minimally Detectable Change: 3 points (for single activity); 2 points (for average score)  [facility] (nd). The Patient Specific Functional Scale . Retrieved from [email or web address]   COGNITION: 03/20/2024 Overall cognitive status: Within functional limits for tasks assessed     SENSATION: 03/20/2024 Light touch: diminished in fingers secondary to carpal tunnel Carpal tunnel leading to n/t in fingers;   POSTURE: 03/20/2024 Increased thoracic kyphosis, forward head, and forward rounded shoulders   UPPER EXTREMITY ROM:    ROM Right Eval 03/20/2024 Left Eval 03/20/2024 Left 04/04/24 Left 04/16/24 Lt   Shoulder flexion WFL 85 AAROM, highly painful A: 15  (supine) A: 112  (sitting) 140 (supine) P: 147 Standing AROM: 130deg  Shoulder extension       Shoulder abduction WFL 60 AAROM, highly painful  A: 85  (sitting) 101 (supine) P: 109 Standing AROM: 98deg  Shoulder adduction       Shoulder internal rotation T8 75 AAROM at 45deg abd in supine  P: 85 in 60 deg abdct   Shoulder external rotation T1 23 AAROM at 45deg abd in supine  P: 60 in 60 deg abdct   (Blank rows = not tested)  UPPER EXTREMITY MMT:  03/20/2024 unable to perform on eval due to protocol restrictions  MMT Right Eval 03/20/2024 Left Eval 03/20/2024 Right 04/24/2024 Left 04/24/2024  Shoulder flexion  2/5 c pain Unable to lift full range against gravity.   Shoulder extension      Shoulder abduction      Shoulder adduction      Shoulder internal rotation      Shoulder external rotation      Middle trapezius      Lower trapezius      Elbow flexion      Elbow extension      Wrist flexion      Wrist extension      Wrist ulnar  deviation      Wrist radial deviation      Wrist pronation      Wrist supination      Grip strength (lbs)      (Blank rows = not tested)  SHOULDER SPECIAL TESTS: 03/20/2024 Did not perform on eval   JOINT MOBILITY TESTING:  03/20/2024 Not assessed on eval   PALPATION:  03/20/2024 Not assessed on eval                   TREATMENT 06/05/2024 TherAct: Pulleys 3 min shoulder flexion, 3 min shoulder scaption/abduction  Wall ladder shoulder abduction 2x10 ; verbal cues provided for appropriate form  Extension with PVC behind back 1x10 with 5s hold  Intermittent posterior upright row performed 1x12  Horizontal adduction stretch 2x30s  Functional IR stretch with towel 2x30s  Shoulder flexion with 1# DB to second shelf in cabinet 1x10  Shoulder abduction with 1# DB to to first and second shelf in cabinet 1x10  Bicep curls with 3# DB 1x10 each direction (supinated, neutral, pronated)   06/03/2024 TherAct: Pulleys 3 min shoulder flexion, 3 min shoulder scaption/abduction  Wall ladder shoulder abduction 2x10   Extension with PVC behind back 1x10 with 5s hold  Horizontal adduction stretch 2x30s  Functional IR stretch with towel 2x30s   TherEx:  Standing rows with blue TB 2x15 with 2s hold  Standing tricep extension with green TB 2x15 with verbal and tactile cues provided for appropriate form   05/27/2024 TherEx:  UBE bilat UE level 3 for 4 min fwd/4 min back    Wall ladder into shoulder abduction and flexion 1x20 each  Supine horizontal abduction with yellow TB 1x15, red TB 1x15  Sidelying abduction with 1# DB 1x15  Sidelying ER with 1# DB 1x15  Seated bicep curls 1x8 each direction (hand in supine, neutral, then prone)  Seated shoulder flexion with 2# Dbs 1x10   05/23/2024 TherEx:  UBE bilat UE level 3 for 4 min fwd/4 min back Wall ladder into shoulder abduction 2x15 Wall ladder into shoulder flexion 2x15  Seated bicep curls with 2# DBs 2x12  Seated flexion with 2# DBs  2x8 Seated abduction with 2# DB 2x6 Seated D2 with no weight 2x8   05/21/2024 TherEx:  UBE bilat UE level 3 for 4 min fwd/4 min back Wall ladder into shoulder abduction x10  Wall ladder into shoulder flexion x10  Seated bicep curls with 2# bar 2x20  Neuro Re-Ed:  Standing D1 PNF pattern 1x10 with no weight  Standing D2 PNF pattern 1x8 with no weight  Standing rows with blue TB 2x10 with 2s hold Standing extension with blue TB 2x10 with 1-2s hold  Seated rhythmic stabilization with elbow flexed to 90deg with gentle perturbations in ER/IR/ext/flex (elbow flexion perturbation closer to elbow than wrist) 2x30s      PATIENT EDUCATION: Eval: Education details: HEP, POC, typical recovery from rotator cuff repair Person educated: Patient and  Child(ren) Education method: Explanation, Demonstration, Verbal cues, and Handouts Education comprehension: verbalized understanding  HOME EXERCISE PROGRAM: Access Code: [email or web address] URL: [email or web address] Date: 04/28/2024 Prepared by: Corean Ku  Exercises - Seated Scapular Retraction  - 1 x daily - 7 x weekly - 3 sets - 10 reps - Supine Shoulder Flexion Extension AAROM with Dowel  - 2-3 x daily - 7 x weekly - 1-2 sets - 10-15 reps - 3 hold - Supine Shoulder Circles  - 2-3 x daily - 7 x weekly - 1-2 sets - 20-30 reps - Standing Isometric Shoulder External Rotation with Doorway  - 1-2 x daily - 7 x weekly - 1 sets - 10 reps - 5-10 hold - Standing Isometric Shoulder Flexion with Doorway - Arm Bent  - 1-2 x daily - 7 x weekly - 1 sets - 10 reps - 5-10 hold - Standing Isometric Shoulder Abduction with Doorway - Arm Bent  - 1-2 x daily - 7 x weekly - 1 sets - 10 reps - 5-10 hold  ASSESSMENT:  CLINICAL IMPRESSION:  Patient arrived to session noting overall improvement in generalized pain, though continues to have consistent 4/10 soreness in shoulder. Patient tolerated all activities this date and continues to make  improvements. Patient will continue to benefit from skilled PT.    OBJECTIVE IMPAIRMENTS: decreased activity tolerance, decreased coordination, decreased endurance, decreased mobility, decreased ROM, decreased strength, decreased safety awareness, hypomobility, increased edema, impaired flexibility, impaired sensation, impaired UE functional use, improper body mechanics, postural dysfunction, obesity, and pain.   ACTIVITY LIMITATIONS: carrying, lifting, sleeping, bed mobility, bathing, dressing, reach over head, and hygiene/grooming  PARTICIPATION LIMITATIONS: meal prep, cleaning, driving, community activity, and yard work  PERSONAL FACTORS: Past/current experiences, Time since onset of injury/illness/exacerbation, and 3+ comorbidities: HTN, hyperlipidemia, GERD, fibromyalgia, COPD, dyspnea, cataracts, chronic obstructive bronchitis, asthma, arthritis, history of sleep apnea are also affecting patient's functional outcome.   REHAB POTENTIAL: Good  CLINICAL DECISION MAKING: Evolving/moderate complexity  EVALUATION COMPLEXITY: Moderate  GOALS: Goals reviewed with patient? Yes  SHORT TERM GOALS: Target date: 04/24/2024  Patient will show compliance with initial HEP. Baseline: Goal status: MET 04/16/24  2.  Patient will report overall pain levels no greater than 5/10. Baseline:  Goal status: MET 05/21/2024  3.  Patient will gain full PROM and AAROM of Lt shoulder to improve functional mobility. Baseline:  Goal status: ongoing 05/21/2024  4.  Patient will increase PSFS to at least 3.8 to show a significant improvement to subjective disability rating.  Baseline:  Goal status: MET 04/16/24   LONG TERM GOALS: Target date: 06/12/2024  Patient will be independent with final HEP in order to maintain and progress upon functional gains made within PT. Baseline:  Goal status: ongoing 05/21/2024  2.  Patient will report overall pain levels no greater than 2/10. Baseline:  Goal status:  ongoing 05/21/2024  3.   Patient will increase PSFS to at least 5.8 to show a significant improvement to subjective disability rating.  Baseline:  Goal status: ongoing 05/21/2024  4.  Patient will return to full AROM of Lt shoulder in order to improve overall functional mobility.  Baseline:  Goal status: ongoing 05/21/2024  5.  Patient will show strength in all motions in Lt shoulder to at least 4-/5 in order to show improved biomechanics with functional mobility.  Baseline:  Goal status: ongoing 05/21/2024  6.  Patient will report improvement in overall sleep through the night in order to show improved overall quality of life. Baseline:  Goal status: ongoing 05/21/2024  PLAN: PT FREQUENCY: 2-3x/wk for 6 weeks, 1-2x/wk for 6 weeks   PT DURATION: 12 weeks  PLANNED INTERVENTIONS: [id number]- PT Re-evaluation, 97750- Physical Performance Testing, 97110-Therapeutic exercises, 97530- Therapeutic activity, [id number]- Neuromuscular re-education, 97535- Self Care, [id number]- Manual therapy, [id number]- Gait training, [phone]- Canalith repositioning, [id number]- Electrical stimulation (unattended), [phone]- Electrical stimulation (manual), [id number]- Vasopneumatic device, [id number]- Ultrasound, [id number]- Traction (mechanical), [id number]- Ionotophoresis 4mg /ml Dexamethasone , [id number] (1-2 muscles), [id number] (3+ muscles)- Dry Needling, Patient/Family education, Balance training, Stair training, Taping, Joint mobilization, Joint manipulation, Spinal manipulation, Spinal mobilization, Scar mobilization, Vestibular training, DME instructions, Cryotherapy, and Moist heat  PLAN FOR NEXT SESSION:      functional IR stretch, AAROM to AROM progression, avoiding shrug - use gravity reduced positioning.    NEXT MD VISIT:   Susannah Daring, PT, DPT 06/05/24 11:01 AM

## 2024-06-05 ENCOUNTER — Ambulatory Visit (INDEPENDENT_AMBULATORY_CARE_PROVIDER_SITE_OTHER)

## 2024-06-05 DIAGNOSIS — M62838 Other muscle spasm: Secondary | ICD-10-CM

## 2024-06-05 DIAGNOSIS — R2689 Other abnormalities of gait and mobility: Secondary | ICD-10-CM

## 2024-06-05 DIAGNOSIS — M6281 Muscle weakness (generalized): Secondary | ICD-10-CM | POA: Diagnosis not present

## 2024-06-05 DIAGNOSIS — M25612 Stiffness of left shoulder, not elsewhere classified: Secondary | ICD-10-CM | POA: Diagnosis not present

## 2024-06-05 DIAGNOSIS — R600 Localized edema: Secondary | ICD-10-CM

## 2024-06-05 DIAGNOSIS — M25512 Pain in left shoulder: Secondary | ICD-10-CM | POA: Diagnosis not present

## 2024-06-05 DIAGNOSIS — G8929 Other chronic pain: Secondary | ICD-10-CM

## 2024-06-06 NOTE — Therapy (Signed)
 OUTPATIENT PHYSICAL THERAPY TREATMENT    Patient Name: Frances Nelson MRN: [id number] DOB:April 28, 1956, 68 y.o., female Today's Date: 06/09/2024  END OF SESSION:  PT End of Session - 06/09/24 1107     Visit Number 25    Number of Visits 30    Date for Recertification  06/12/24    Authorization Type [employer] AND [employer] [place]    Progress Note Due on Visit 20    PT Start Time 1103    PT Stop Time 1141    PT Time Calculation (min) 38 min    Activity Tolerance Patient limited by pain    Behavior During Therapy [facility] for tasks assessed/performed             Past Medical History:  Diagnosis Date   Allergy 01.01.1975   Ambulates with cane    straight cane   Arthritis    Asthma    Bronchitis, obstructive, chronic (HCC)    hx   Cataracts, bilateral    pt has not had surgery to remove as of 02/15/24   Complication of anesthesia    patient states slow to wake up after anesthesia   COPD (chronic obstructive pulmonary disease) (HCC)    Dyspnea    only with exertion   Fibromyalgia    GERD (gastroesophageal reflux disease)    Headache    HLD (hyperlipidemia)    Hypertension    Pneumonia    x several   Past Surgical History:  Procedure Laterality Date   ABDOMINAL HYSTERECTOMY  8.19.1990   APPENDECTOMY  9.1.1980   BICEPT TENODESIS Left 02/21/2024   Procedure: TENODESIS, BICEPS;  Surgeon: Addie Cordella Hamilton, MD;  Location: [facility] OR;  Service: Orthopedics;  Laterality: Left;   CYST EXCISION     upper right side lateral - benign   HERNIA REPAIR     Umbilical x 3   KNEE ARTHROSCOPY W/ MENISCAL REPAIR Left 12/2022   POSTERIOR LUMBAR FUSION 2 WITH HARDWARE REMOVAL Left 02/21/2024   Procedure: ARTHROSCOPY, SHOULDER WITH DEBRIDEMENT;  Surgeon: Addie Cordella Hamilton, MD;  Location: [facility] OR;  Service: Orthopedics;  Laterality: Left;  left shoulder arthroscopy, debridement, biceps tenodesis, mini open rotator cuff tear repair   SHOULDER OPEN ROTATOR CUFF REPAIR Left 02/21/2024    Procedure: REPAIR, ROTATOR CUFF, OPEN;  Surgeon: Addie Cordella Hamilton, MD;  Location: [facility] OR;  Service: Orthopedics;  Laterality: Left;   SPINE SURGERY  05/2021   TONSILLECTOMY     TRANSFORAMINAL LUMBAR INTERBODY FUSION (TLIF) WITH PEDICLE SCREW FIXATION 1 LEVEL N/A 05/18/2022   Procedure: Lumbar Four-Five Open Laminectomy/Transforaminal Lumbar Interbody Fusion/Posterolateral fusion;  Surgeon: Cheryle Debby LABOR, MD;  Location: [facility] OR;  Service: Neurosurgery;  Laterality: N/A;   Patient Active Problem List   Diagnosis Date Noted   Nail disorder 04/05/2024   Synovitis of left shoulder 03/09/2024   Biceps tendonitis on left 03/09/2024   Degenerative superior labral anterior-to-posterior (SLAP) tear of left shoulder 03/09/2024   Complete tear of left rotator cuff 03/09/2024   Obesity 10/13/2023   Shingles outbreak 05/29/2023   Microhematuria 04/09/2023   Bilateral shoulder pain 04/09/2023   Urethritis 10/05/2022   External otitis of left ear 10/05/2022   Spondylolisthesis of lumbar region 05/18/2022   Smoker 04/08/2022   Deviated nasal septum 04/08/2022   Bilateral pain of leg and foot 04/08/2022   Follicular acne 04/06/2022   Baker cyst, left 04/06/2022   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   Depression 10/04/2021   Left  knee pain 10/04/2021   Chronic pain 04/09/2021   Pelvic pain 04/09/2021   HLD (hyperlipidemia) 04/09/2021   Vitamin D  deficiency 04/09/2021   Estrogen deficiency 04/09/2021   Hyperglycemia 04/01/2021   Chronic cough 12/30/2020   GERD (gastroesophageal reflux disease) 11/06/2019   Bilateral leg edema 10/16/2019   Pleuritic chest pain 10/16/2019   Hypertension 10/02/2019   Arthralgia 06/28/2018   Sinusitis 06/28/2018   Renal cyst 05/03/2018   Syncope 05/03/2018   Stress and adjustment reaction 05/03/2018   History of tobacco use 05/02/2018   Primary osteoarthritis of both hands 04/10/2018   Primary osteoarthritis of both  knees 04/10/2018   Primary osteoarthritis of both feet 04/10/2018   Achilles tendinitis 04/02/2018   Screening for breast cancer 03/15/2018   Positive ANA (antinuclear antibody) 03/15/2018   Screening for colon cancer 03/15/2018   Rash 03/15/2018   Achilles tendon pain 11/13/2017   Cervical myelopathy with cervical radiculopathy (HCC) 11/13/2017   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   Allergic rhinitis 06/19/2017   Edema 12/24/2015   Carpal tunnel syndrome, right 12/16/2015   Hypercalcemia 11/11/2015   Pain in both upper extremities 11/11/2015   Drug reaction 10/28/2015   Muscle spasms of both lower extremities 10/28/2015   Nerve pain 10/28/2015   Chronic asthmatic bronchitis with acute exacerbation (HCC) 09/06/2015   Environmental and seasonal allergies 07/17/2014    PCP: Lynwood LELON Rush, MD   REFERRING PROVIDER: Cordella Glendia Hutchinson, MD   REFERRING DIAG: [id number] (ICD-10-CM) - S/P rotator cuff repair  THERAPY DIAG:  Chronic left shoulder pain  Stiffness of left shoulder, not elsewhere classified  Muscle weakness (generalized)  Localized edema  Other abnormalities of gait and mobility  Other muscle spasm  Rationale for Evaluation and Treatment: Rehabilitation  ONSET DATE: 02/21/2024 left shoulder arthroscopy with debridement biceps tenodesis rotator cuff tear repair with a 2 x 3 construct  SUBJECTIVE:                                                                                                                                                                                      SUBJECTIVE STATEMENT: Patient reports no changes in shoulder, though continues to have difficulty with low back. Patient was unable to reach out to MD about bilat edema in feet.    Hand dominance: Right  PERTINENT HISTORY: Patient endorsed having gone through PT and multiple shoulder injection prior to appointment with Dr. Hutchinson, who scheduled and performed her surgery. Patient notes that her  original pain started in Feb 2025, though did have a fall down stairs Feb 2024 and is not sure if that is when she injured shoulder as she did not  have pain at that time. Patient used CPM daily and wears sling only in public at this time, but is working on weaning from both. Patient endorses difficulty with sleeping and can average 5 hours per night when not in so much pain. Patient has undergone knee surgery (2023), back surgery (2024), and also currently has bilat carpal tunnel with n/t in fingers and hands.  PAIN:   NPRS scale: 3/10 Pain location: Lt shoulder/arm Pain description: all pain descriptors, but most of the time achey Aggravating factors: moving a certain way, reaching, trying to do hair, picking something up   Relieving factors: tylenol  arthritis twice a day, tramadol  as needed (mid morning, before bed), muscle relaxer, arnica herb (gel for bruising and pain), lidocaine  patches, ice    PRECAUTIONS: Shoulder  RED FLAGS: None   WEIGHT BEARING RESTRICTIONS: Yes Lt shoulder   FALLS:  Has patient fallen in last 6 months? Yes. Number of falls 1, slipped in the bath tub   LIVING ENVIRONMENT: Lives with: lives with their family and lives with their son Lives in: House/apartment Stairs: Yes: Internal: 15 steps; on left going up and External: 3 steps; none Has following equipment at home: Single point cane, Quad cane small base, Walker - 2 wheeled, shower chair, Shower bench, and Grab bars  OCCUPATION: Retired; print production planner   PLOF: Independent and Independent with community mobility with device  PATIENT GOALS: to be able to use my arm again   OBJECTIVE:  Note: Objective measures were completed at Evaluation unless otherwise noted.  DIAGNOSTIC FINDINGS:    PATIENT SURVEYS :  PSFS: THE PATIENT SPECIFIC FUNCTIONAL SCALE  Place score of 0-10 (0 = unable to perform activity and 10 = able to perform activity at the same level as before injury or problem)  Activity Date:  03/20/2024  04/16/24 05/21/2024  Get dressed and putting shoes on  4 4 5   2. Brush and style your hair / showering  1 5 4   3. Cook  2 6 5   4. Washing dishes  1  4 6   5. Clean, sweep, mop  1 2 6   Total Score 1.8 4.2 5.2    Total Score = Sum of activity scores/number of activities  Minimally Detectable Change: 3 points (for single activity); 2 points (for average score)  [facility] (nd). The Patient Specific Functional Scale . Retrieved from [email or web address]   COGNITION: 03/20/2024 Overall cognitive status: Within functional limits for tasks assessed     SENSATION: 03/20/2024 Light touch: diminished in fingers secondary to carpal tunnel Carpal tunnel leading to n/t in fingers;   POSTURE: 03/20/2024 Increased thoracic kyphosis, forward head, and forward rounded shoulders   UPPER EXTREMITY ROM:    ROM Right Eval 03/20/2024 Left Eval 03/20/2024 Left 04/04/24 Left 04/16/24 Lt   Shoulder flexion WFL 85 AAROM, highly painful A: 15  (supine) A: 112  (sitting) 140 (supine) P: 147 Standing AROM: 130deg  Shoulder extension       Shoulder abduction WFL 60 AAROM, highly painful  A: 85  (sitting) 101 (supine) P: 109 Standing AROM: 98deg  Shoulder adduction       Shoulder internal rotation T8 75 AAROM at 45deg abd in supine  P: 85 in 60 deg abdct   Shoulder external rotation T1 23 AAROM at 45deg abd in supine  P: 60 in 60 deg abdct   (Blank rows = not tested)  UPPER EXTREMITY MMT:  03/20/2024 unable to perform on eval due to protocol restrictions  MMT Right Eval 03/20/2024 Left Eval 03/20/2024 Right 04/24/2024 Left 04/24/2024  Shoulder flexion    2/5 c pain Unable to lift full range against gravity.   Shoulder extension      Shoulder abduction      Shoulder adduction      Shoulder internal rotation      Shoulder external rotation      Middle trapezius      Lower trapezius      Elbow flexion      Elbow extension       Wrist flexion      Wrist extension      Wrist ulnar deviation      Wrist radial deviation      Wrist pronation      Wrist supination      Grip strength (lbs)      (Blank rows = not tested)  SHOULDER SPECIAL TESTS: 03/20/2024 Did not perform on eval   JOINT MOBILITY TESTING:  03/20/2024 Not assessed on eval   PALPATION:  03/20/2024 Not assessed on eval                   TREATMENT 06/09/2024 TherAct:  Pulleys 3 min shoulder flexion, 3 min shoulder scaption/abduction  Wall ladder shoulder abduction 2x10  Extension with PVC behind back 1x12 with 5s hold  Horizontal adduction stretch 2x45s  Functional IR stretch with towel 2x45s   TherEx:  Sidelying abduction 1x10 with no weight, 2x10 with 1# DB Sidelying ER 2x12 with 2# DB   06/05/2024 TherAct: Pulleys 3 min shoulder flexion, 3 min shoulder scaption/abduction  Wall ladder shoulder abduction 2x10 ; verbal cues provided for appropriate form  Extension with PVC behind back 1x10 with 5s hold  Intermittent posterior upright row performed 1x12  Horizontal adduction stretch 2x30s  Functional IR stretch with towel 2x30s  Shoulder flexion with 1# DB to second shelf in cabinet 1x10  Shoulder abduction with 1# DB to to first and second shelf in cabinet 1x10  Bicep curls with 3# DB 1x10 each direction (supinated, neutral, pronated)   06/03/2024 TherAct: Pulleys 3 min shoulder flexion, 3 min shoulder scaption/abduction  Wall ladder shoulder abduction 2x10   Extension with PVC behind back 1x10 with 5s hold  Horizontal adduction stretch 2x30s  Functional IR stretch with towel 2x30s   TherEx:  Standing rows with blue TB 2x15 with 2s hold  Standing tricep extension with green TB 2x15 with verbal and tactile cues provided for appropriate form   05/27/2024 TherEx:  UBE bilat UE level 3 for 4 min fwd/4 min back    Wall ladder into shoulder abduction and flexion 1x20 each  Supine horizontal abduction with yellow TB 1x15, red TB  1x15  Sidelying abduction with 1# DB 1x15  Sidelying ER with 1# DB 1x15  Seated bicep curls 1x8 each direction (hand in supine, neutral, then prone)  Seated shoulder flexion with 2# Dbs 1x10   05/23/2024 TherEx:  UBE bilat UE level 3 for 4 min fwd/4 min back Wall ladder into shoulder abduction 2x15 Wall ladder into shoulder flexion 2x15  Seated bicep curls with 2# DBs 2x12  Seated flexion with 2# DBs 2x8 Seated abduction with 2# DB 2x6 Seated D2 with no weight 2x8    PATIENT EDUCATION: Eval: Education details: HEP, POC, typical recovery from rotator cuff repair Person educated: Patient and Child(ren) Education method: Explanation, Demonstration, Verbal cues, and Handouts Education comprehension: verbalized understanding  HOME EXERCISE PROGRAM: Access Code: [email or web address] URL: [email or web address] Date:  04/28/2024 Prepared by: Corean Ku  Exercises - Seated Scapular Retraction  - 1 x daily - 7 x weekly - 3 sets - 10 reps - Supine Shoulder Flexion Extension AAROM with Dowel  - 2-3 x daily - 7 x weekly - 1-2 sets - 10-15 reps - 3 hold - Supine Shoulder Circles  - 2-3 x daily - 7 x weekly - 1-2 sets - 20-30 reps - Standing Isometric Shoulder External Rotation with Doorway  - 1-2 x daily - 7 x weekly - 1 sets - 10 reps - 5-10 hold - Standing Isometric Shoulder Flexion with Doorway - Arm Bent  - 1-2 x daily - 7 x weekly - 1 sets - 10 reps - 5-10 hold - Standing Isometric Shoulder Abduction with Doorway - Arm Bent  - 1-2 x daily - 7 x weekly - 1 sets - 10 reps - 5-10 hold  ASSESSMENT:  CLINICAL IMPRESSION:  Patient arrived to session noting continued difficulty with low back, though decreased overall pain in the shoulder compared to previous sessions. Patient tolerated all activities, though had minor increase in soreness with flexibility activities. Patient will continue to benefit from skilled PT.    OBJECTIVE IMPAIRMENTS: decreased activity tolerance, decreased  coordination, decreased endurance, decreased mobility, decreased ROM, decreased strength, decreased safety awareness, hypomobility, increased edema, impaired flexibility, impaired sensation, impaired UE functional use, improper body mechanics, postural dysfunction, obesity, and pain.   ACTIVITY LIMITATIONS: carrying, lifting, sleeping, bed mobility, bathing, dressing, reach over head, and hygiene/grooming  PARTICIPATION LIMITATIONS: meal prep, cleaning, driving, community activity, and yard work  PERSONAL FACTORS: Past/current experiences, Time since onset of injury/illness/exacerbation, and 3+ comorbidities: HTN, hyperlipidemia, GERD, fibromyalgia, COPD, dyspnea, cataracts, chronic obstructive bronchitis, asthma, arthritis, history of sleep apnea are also affecting patient's functional outcome.   REHAB POTENTIAL: Good  CLINICAL DECISION MAKING: Evolving/moderate complexity  EVALUATION COMPLEXITY: Moderate  GOALS: Goals reviewed with patient? Yes  SHORT TERM GOALS: Target date: 04/24/2024  Patient will show compliance with initial HEP. Baseline: Goal status: MET 04/16/24  2.  Patient will report overall pain levels no greater than 5/10. Baseline:  Goal status: MET 05/21/2024  3.  Patient will gain full PROM and AAROM of Lt shoulder to improve functional mobility. Baseline:  Goal status: ongoing 05/21/2024  4.  Patient will increase PSFS to at least 3.8 to show a significant improvement to subjective disability rating.  Baseline:  Goal status: MET 04/16/24   LONG TERM GOALS: Target date: 06/12/2024  Patient will be independent with final HEP in order to maintain and progress upon functional gains made within PT. Baseline:  Goal status: ongoing 05/21/2024  2.  Patient will report overall pain levels no greater than 2/10. Baseline:  Goal status: ongoing 05/21/2024  3.   Patient will increase PSFS to at least 5.8 to show a significant improvement to subjective disability rating.   Baseline:  Goal status: ongoing 05/21/2024  4.  Patient will return to full AROM of Lt shoulder in order to improve overall functional mobility.  Baseline:  Goal status: ongoing 05/21/2024  5.  Patient will show strength in all motions in Lt shoulder to at least 4-/5 in order to show improved biomechanics with functional mobility.  Baseline:  Goal status: ongoing 05/21/2024  6.  Patient will report improvement in overall sleep through the night in order to show improved overall quality of life. Baseline:  Goal status: ongoing 05/21/2024  PLAN: PT FREQUENCY: 2-3x/wk for 6 weeks, 1-2x/wk for 6 weeks   PT  DURATION: 12 weeks  PLANNED INTERVENTIONS: [id number]- PT Re-evaluation, 97750- Physical Performance Testing, 97110-Therapeutic exercises, 97530- Therapeutic activity, [id number]- Neuromuscular re-education, 97535- Self Care, [id number]- Manual therapy, [id number]- Gait training, [phone]- Canalith repositioning, [id number]- Electrical stimulation (unattended), [phone]- Electrical stimulation (manual), [id number]- Vasopneumatic device, [id number]- Ultrasound, [id number]- Traction (mechanical), [id number]- Ionotophoresis 4mg /ml Dexamethasone , [id number] (1-2 muscles), [id number] (3+ muscles)- Dry Needling, Patient/Family education, Balance training, Stair training, Taping, Joint mobilization, Joint manipulation, Spinal manipulation, Spinal mobilization, Scar mobilization, Vestibular training, DME instructions, Cryotherapy, and Moist heat  PLAN FOR NEXT SESSION:      functional IR stretch, AAROM to AROM progression, avoiding shrug - use gravity reduced positioning.    NEXT MD VISIT:   Susannah Daring, PT, DPT 06/09/24 11:44 AM

## 2024-06-09 ENCOUNTER — Ambulatory Visit

## 2024-06-09 DIAGNOSIS — R600 Localized edema: Secondary | ICD-10-CM | POA: Diagnosis not present

## 2024-06-09 DIAGNOSIS — M25612 Stiffness of left shoulder, not elsewhere classified: Secondary | ICD-10-CM

## 2024-06-09 DIAGNOSIS — R2689 Other abnormalities of gait and mobility: Secondary | ICD-10-CM

## 2024-06-09 DIAGNOSIS — M6281 Muscle weakness (generalized): Secondary | ICD-10-CM

## 2024-06-09 DIAGNOSIS — G8929 Other chronic pain: Secondary | ICD-10-CM

## 2024-06-09 DIAGNOSIS — M25512 Pain in left shoulder: Secondary | ICD-10-CM

## 2024-06-09 DIAGNOSIS — M62838 Other muscle spasm: Secondary | ICD-10-CM

## 2024-06-10 NOTE — Therapy (Signed)
 OUTPATIENT PHYSICAL THERAPY TREATMENT / DISCHARGE   Patient Name: Frances Nelson MRN: [id number] DOB:1955/10/16, 68 y.o., female Today's Date: 06/11/2024  END OF SESSION:  PT End of Session - 06/11/24 1059     Visit Number 26    Number of Visits 30    Date for Recertification  06/12/24    Authorization Type [employer] AND [employer] [place]    Progress Note Due on Visit 20    PT Start Time 1101    PT Stop Time 1140    PT Time Calculation (min) 39 min    Activity Tolerance Patient limited by pain    Behavior During Therapy [facility] for tasks assessed/performed              Past Medical History:  Diagnosis Date   Allergy 01.01.1975   Ambulates with cane    straight cane   Arthritis    Asthma    Bronchitis, obstructive, chronic (HCC)    hx   Cataracts, bilateral    pt has not had surgery to remove as of 02/15/24   Complication of anesthesia    patient states slow to wake up after anesthesia   COPD (chronic obstructive pulmonary disease) (HCC)    Dyspnea    only with exertion   Fibromyalgia    GERD (gastroesophageal reflux disease)    Headache    HLD (hyperlipidemia)    Hypertension    Pneumonia    x several   Past Surgical History:  Procedure Laterality Date   ABDOMINAL HYSTERECTOMY  8.19.1990   APPENDECTOMY  9.1.1980   BICEPT TENODESIS Left 02/21/2024   Procedure: TENODESIS, BICEPS;  Surgeon: Addie Cordella [facility], MD;  Location: [facility] OR;  Service: Orthopedics;  Laterality: Left;   CYST EXCISION     upper right side lateral - benign   HERNIA REPAIR     Umbilical x 3   KNEE ARTHROSCOPY W/ MENISCAL REPAIR Left 12/2022   POSTERIOR LUMBAR FUSION 2 WITH HARDWARE REMOVAL Left 02/21/2024   Procedure: ARTHROSCOPY, SHOULDER WITH DEBRIDEMENT;  Surgeon: Addie Cordella [facility], MD;  Location: [facility] OR;  Service: Orthopedics;  Laterality: Left;  left shoulder arthroscopy, debridement, biceps tenodesis, mini open rotator cuff tear repair   SHOULDER OPEN ROTATOR CUFF REPAIR Left  02/21/2024   Procedure: REPAIR, ROTATOR CUFF, OPEN;  Surgeon: Addie Cordella [facility], MD;  Location: [facility] OR;  Service: Orthopedics;  Laterality: Left;   SPINE SURGERY  05/2021   TONSILLECTOMY     TRANSFORAMINAL LUMBAR INTERBODY FUSION (TLIF) WITH PEDICLE SCREW FIXATION 1 LEVEL N/A 05/18/2022   Procedure: Lumbar Four-Five Open Laminectomy/Transforaminal Lumbar Interbody Fusion/Posterolateral fusion;  Surgeon: Cheryle Debby LABOR, MD;  Location: [facility] OR;  Service: Neurosurgery;  Laterality: N/A;   Patient Active Problem List   Diagnosis Date Noted   Nail disorder 04/05/2024   Synovitis of left shoulder 03/09/2024   Biceps tendonitis on left 03/09/2024   Degenerative superior labral anterior-to-posterior (SLAP) tear of left shoulder 03/09/2024   Complete tear of left rotator cuff 03/09/2024   Obesity 10/13/2023   Shingles outbreak 05/29/2023   Microhematuria 04/09/2023   Bilateral shoulder pain 04/09/2023   Urethritis 10/05/2022   External otitis of left ear 10/05/2022   Spondylolisthesis of lumbar region 05/18/2022   Smoker 04/08/2022   Deviated nasal septum 04/08/2022   Bilateral pain of leg and foot 04/08/2022   Follicular acne 04/06/2022   Baker cyst, left 04/06/2022   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   Depression 10/04/2021  Left knee pain 10/04/2021   Chronic pain 04/09/2021   Pelvic pain 04/09/2021   HLD (hyperlipidemia) 04/09/2021   Vitamin D  deficiency 04/09/2021   Estrogen deficiency 04/09/2021   Hyperglycemia 04/01/2021   Chronic cough 12/30/2020   GERD (gastroesophageal reflux disease) 11/06/2019   Bilateral leg edema 10/16/2019   Pleuritic chest pain 10/16/2019   Hypertension 10/02/2019   Arthralgia 06/28/2018   Sinusitis 06/28/2018   Renal cyst 05/03/2018   Syncope 05/03/2018   Stress and adjustment reaction 05/03/2018   History of tobacco use 05/02/2018   Primary osteoarthritis of both hands 04/10/2018   Primary  osteoarthritis of both knees 04/10/2018   Primary osteoarthritis of both feet 04/10/2018   Achilles tendinitis 04/02/2018   Screening for breast cancer 03/15/2018   Positive ANA (antinuclear antibody) 03/15/2018   Screening for colon cancer 03/15/2018   Rash 03/15/2018   Achilles tendon pain 11/13/2017   Cervical myelopathy with cervical radiculopathy (HCC) 11/13/2017   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   Allergic rhinitis 06/19/2017   Edema 12/24/2015   Carpal tunnel syndrome, right 12/16/2015   Hypercalcemia 11/11/2015   Pain in both upper extremities 11/11/2015   Drug reaction 10/28/2015   Muscle spasms of both lower extremities 10/28/2015   Nerve pain 10/28/2015   Chronic asthmatic bronchitis with acute exacerbation (HCC) 09/06/2015   Environmental and seasonal allergies 07/17/2014    PCP: Lynwood LELON Rush, MD   REFERRING PROVIDER: Cordella Glendia Hutchinson, MD   REFERRING DIAG: [phone] (ICD-10-CM) - S/P rotator cuff repair  THERAPY DIAG:  Chronic left shoulder pain  Stiffness of left shoulder, not elsewhere classified  Muscle weakness (generalized)  Localized edema  Other abnormalities of gait and mobility  Other muscle spasm  Rationale for Evaluation and Treatment: Rehabilitation  ONSET DATE: 02/21/2024 left shoulder arthroscopy with debridement biceps tenodesis rotator cuff tear repair with a 2 x 3 construct  SUBJECTIVE:                                                                                                                                                                                      SUBJECTIVE STATEMENT: Patient reports a little soreness in Lt shoulder this morning, but has improved with time.    Hand dominance: Right  PERTINENT HISTORY: Patient endorsed having gone through PT and multiple shoulder injection prior to appointment with Dr. Hutchinson, who scheduled and performed her surgery. Patient notes that her original pain started in Feb 2025, though did  have a fall down stairs Feb 2024 and is not sure if that is when she injured shoulder as she did not have pain at that time. Patient used CPM daily and wears  sling only in public at this time, but is working on weaning from both. Patient endorses difficulty with sleeping and can average 5 hours per night when not in so much pain. Patient has undergone knee surgery (2023), back surgery (2024), and also currently has bilat carpal tunnel with n/t in fingers and hands.  PAIN:   NPRS scale: 3.5/10 Pain location: Lt shoulder/arm Pain description: all pain descriptors, but most of the time achey Aggravating factors: moving a certain way, reaching, trying to do hair, picking something up   Relieving factors: tylenol  arthritis twice a day, tramadol  as needed (mid morning, before bed), muscle relaxer, arnica herb (gel for bruising and pain), lidocaine  patches, ice    PRECAUTIONS: Shoulder  RED FLAGS: None   WEIGHT BEARING RESTRICTIONS: Yes Lt shoulder   FALLS:  Has patient fallen in last 6 months? Yes. Number of falls 1, slipped in the bath tub   LIVING ENVIRONMENT: Lives with: lives with their family and lives with their son Lives in: House/apartment Stairs: Yes: Internal: 15 steps; on left going up and External: 3 steps; none Has following equipment at home: Single point cane, Quad cane small base, Walker - 2 wheeled, shower chair, Shower bench, and Grab bars  OCCUPATION: Retired; print production planner   PLOF: Independent and Independent with community mobility with device  PATIENT GOALS: to be able to use my arm again   OBJECTIVE:  Note: Objective measures were completed at Evaluation unless otherwise noted.  DIAGNOSTIC FINDINGS:    PATIENT SURVEYS :  PSFS: THE PATIENT SPECIFIC FUNCTIONAL SCALE  Place score of 0-10 (0 = unable to perform activity and 10 = able to perform activity at the same level as before injury or problem)  Activity Date: 03/20/2024  04/16/24 05/21/2024 06/11/2024  Get  dressed and putting shoes on  4 4 5 4   2. Brush and style your hair / showering  1 5 4 5   3. Cook  2 6 5 6   4. Washing dishes  1  4 6 7   5. Clean, sweep, mop  1 2 6 3   Total Score 1.8 4.2 5.2 5    Total Score = Sum of activity scores/number of activities  Minimally Detectable Change: 3 points (for single activity); 2 points (for average score)  [facility] (nd). The Patient Specific Functional Scale . Retrieved from [email or web address]   COGNITION: 03/20/2024 Overall cognitive status: Within functional limits for tasks assessed     SENSATION: 03/20/2024 Light touch: diminished in fingers secondary to carpal tunnel Carpal tunnel leading to n/t in fingers;   POSTURE: 03/20/2024 Increased thoracic kyphosis, forward head, and forward rounded shoulders   UPPER EXTREMITY ROM:    ROM Right Eval 03/20/2024 Left Eval 03/20/2024 Left 04/04/24 Left 04/16/24 Lt  lT  06/10/2024  Shoulder flexion WFL 85 AAROM, highly painful A: 15  (supine) A: 112  (sitting) 140 (supine) P: 147 Standing AROM: 130deg Standing AROM: 140deg   Shoulder extension        Shoulder abduction WFL 60 AAROM, highly painful  A: 85  (sitting) 101 (supine) P: 109 Standing AROM: 98deg Standing AROM: 120deg  Shoulder adduction        Shoulder internal rotation T8 75 AAROM at 45deg abd in supine  P: 85 in 60 deg abdct  Functional: L3  Shoulder external rotation T1 23 AAROM at 45deg abd in supine  P: 60 in 60 deg abdct    (Blank rows = not tested)  UPPER EXTREMITY MMT:  03/20/2024 unable to perform on eval due to protocol restrictions  MMT Right Eval 03/20/2024 Left Eval 03/20/2024 Right 04/24/2024 Left 04/24/2024  Shoulder flexion    2/5 c pain Unable to lift full range against gravity.   Shoulder extension      Shoulder abduction      Shoulder adduction      Shoulder internal rotation      Shoulder external rotation      Middle trapezius      Lower  trapezius      Elbow flexion      Elbow extension      Wrist flexion      Wrist extension      Wrist ulnar deviation      Wrist radial deviation      Wrist pronation      Wrist supination      Grip strength (lbs)      (Blank rows = not tested)  SHOULDER SPECIAL TESTS: 03/20/2024 Did not perform on eval   JOINT MOBILITY TESTING:  03/20/2024 Not assessed on eval   PALPATION:  03/20/2024 Not assessed on eval                   TREATMENT 06/11/2024 TherEx:  UBE with bilat UE 4 min fwd/4 min back level 3  Wall ladder into shoulder flexion and abduction 1x3 each prior to ROM measurements above   TherAct:  Shoulder flexion with 2# DB to second shelf (almost third) in cabinet 1x10  Shoulder abduction with 2# DB to second shelf in cabinet 1x10  Performed slowly due to muscle fatigue and soreness with eccentric movement  Extension with 2# bar behind back 1x12 with 2-3s hold    06/09/2024 TherAct:  Pulleys 3 min shoulder flexion, 3 min shoulder scaption/abduction  Wall ladder shoulder abduction 2x10  Extension with PVC behind back 1x12 with 5s hold  Horizontal adduction stretch 2x45s  Functional IR stretch with towel 2x45s   TherEx:  Sidelying abduction 1x10 with no weight, 2x10 with 1# DB Sidelying ER 2x12 with 2# DB   06/05/2024 TherAct: Pulleys 3 min shoulder flexion, 3 min shoulder scaption/abduction  Wall ladder shoulder abduction 2x10 ; verbal cues provided for appropriate form  Extension with PVC behind back 1x10 with 5s hold  Intermittent posterior upright row performed 1x12  Horizontal adduction stretch 2x30s  Functional IR stretch with towel 2x30s  Shoulder flexion with 1# DB to second shelf in cabinet 1x10  Shoulder abduction with 1# DB to to first and second shelf in cabinet 1x10  Bicep curls with 3# DB 1x10 each direction (supinated, neutral, pronated)   06/03/2024 TherAct: Pulleys 3 min shoulder flexion, 3 min shoulder scaption/abduction  Wall ladder  shoulder abduction 2x10   Extension with PVC behind back 1x10 with 5s hold  Horizontal adduction stretch 2x30s  Functional IR stretch with towel 2x30s   TherEx:  Standing rows with blue TB 2x15 with 2s hold  Standing tricep extension with green TB 2x15 with verbal and tactile cues provided for appropriate form   05/27/2024 TherEx:  UBE bilat UE level 3 for 4 min fwd/4 min back    Wall ladder into shoulder abduction and flexion 1x20 each  Supine horizontal abduction with yellow TB 1x15, red TB 1x15  Sidelying abduction with 1# DB 1x15  Sidelying ER with 1# DB 1x15  Seated bicep curls 1x8 each direction (hand in supine, neutral, then prone)  Seated shoulder flexion with 2# Dbs 1x10  PATIENT EDUCATION: Eval: Education details: HEP, POC, typical recovery from rotator cuff repair Person educated: Patient and Child(ren) Education method: Explanation, Demonstration, Verbal cues, and Handouts Education comprehension: verbalized understanding  HOME EXERCISE PROGRAM: Access Code: [email or web address] URL: [email or web address] Date: 04/28/2024 Prepared by: Corean Ku  Exercises - Seated Scapular Retraction  - 1 x daily - 7 x weekly - 3 sets - 10 reps - Supine Shoulder Flexion Extension AAROM with Dowel  - 2-3 x daily - 7 x weekly - 1-2 sets - 10-15 reps - 3 hold - Supine Shoulder Circles  - 2-3 x daily - 7 x weekly - 1-2 sets - 20-30 reps - Standing Isometric Shoulder External Rotation with Doorway  - 1-2 x daily - 7 x weekly - 1 sets - 10 reps - 5-10 hold - Standing Isometric Shoulder Flexion with Doorway - Arm Bent  - 1-2 x daily - 7 x weekly - 1 sets - 10 reps - 5-10 hold - Standing Isometric Shoulder Abduction with Doorway - Arm Bent  - 1-2 x daily - 7 x weekly - 1 sets - 10 reps - 5-10 hold  ASSESSMENT:  CLINICAL IMPRESSION:  Patient arrived to session noting similar soreness compared to her typical, but has been able to slowly increase her independence with certain  activities. Patient tolerated all exercises this date with no increase in shoulder pain. Patient has made large improvement over plan of care and is ready to transition to independent activities. Patient is a formal and appropriate candidate for discharge.    OBJECTIVE IMPAIRMENTS: decreased activity tolerance, decreased coordination, decreased endurance, decreased mobility, decreased ROM, decreased strength, decreased safety awareness, hypomobility, increased edema, impaired flexibility, impaired sensation, impaired UE functional use, improper body mechanics, postural dysfunction, obesity, and pain.   ACTIVITY LIMITATIONS: carrying, lifting, sleeping, bed mobility, bathing, dressing, reach over head, and hygiene/grooming  PARTICIPATION LIMITATIONS: meal prep, cleaning, driving, community activity, and yard work  PERSONAL FACTORS: Past/current experiences, Time since onset of injury/illness/exacerbation, and 3+ comorbidities: HTN, hyperlipidemia, GERD, fibromyalgia, COPD, dyspnea, cataracts, chronic obstructive bronchitis, asthma, arthritis, history of sleep apnea are also affecting patient's functional outcome.   REHAB POTENTIAL: Good  CLINICAL DECISION MAKING: Evolving/moderate complexity  EVALUATION COMPLEXITY: Moderate  GOALS: Goals reviewed with patient? Yes  SHORT TERM GOALS: Target date: 04/24/2024  Patient will show compliance with initial HEP. Baseline: Goal status: MET 04/16/24  2.  Patient will report overall pain levels no greater than 5/10. Baseline:  Goal status: MET 05/21/2024  3.  Patient will gain full PROM and AAROM of Lt shoulder to improve functional mobility. Baseline:  Goal status: goal not met, 06/11/2024  4.  Patient will increase PSFS to at least 3.8 to show a significant improvement to subjective disability rating.  Baseline:  Goal status: MET 04/16/24   LONG TERM GOALS: Target date: 06/12/2024  Patient will be independent with final HEP in order to  maintain and progress upon functional gains made within PT. Baseline:  Goal status: GOAL MET, 06/11/2024  2.  Patient will report overall pain levels no greater than 2/10. Baseline:  Goal status: goal not met, 06/11/2024  3.   Patient will increase PSFS to at least 5.8 to show a significant improvement to subjective disability rating.  Baseline:  Goal status: goal not met, 06/11/2024  4.  Patient will return to full AROM of Lt shoulder in order to improve overall functional mobility.  Baseline:  Goal status: goal not met, 06/11/2024  5.  Patient will show strength in  all motions in Lt shoulder to at least 4-/5 in order to show improved biomechanics with functional mobility.  Baseline:  Goal status: goal not met, 06/11/2024  6.  Patient will report improvement in overall sleep through the night in order to show improved overall quality of life. Baseline:  Goal status: GOAL MET, 06/11/2024  PLAN: PT FREQUENCY: 2-3x/wk for 6 weeks, 1-2x/wk for 6 weeks   PT DURATION: 12 weeks  PLANNED INTERVENTIONS: 97164- PT Re-evaluation, 97750- Physical Performance Testing, 97110-Therapeutic exercises, 97530- Therapeutic activity, [id number]- Neuromuscular re-education, 97535- Self Care, 02859- Manual therapy, [id number]- Gait training, [phone]- Canalith repositioning, H9716- Electrical stimulation (unattended), [phone]- Electrical stimulation (manual), 97016- Vasopneumatic device, [id number]- Ultrasound, [id number]- Traction (mechanical), [id number]- Ionotophoresis 4mg /ml Dexamethasone , 79439 (1-2 muscles), 20561 (3+ muscles)- Dry Needling, Patient/Family education, Balance training, Stair training, Taping, Joint mobilization, Joint manipulation, Spinal manipulation, Spinal mobilization, Scar mobilization, Vestibular training, DME instructions, Cryotherapy, and Moist heat  PLAN FOR NEXT SESSION:      PHYSICAL THERAPY DISCHARGE SUMMARY  Visits from Start of Care: 26  Current functional level related to goals / functional  outcomes: See above    Remaining deficits: See above    Education / Equipment: Discussed final HEP    Patient agrees to discharge. Patient goals were partially met. Patient is being discharged due to being pleased with the current functional level.    NEXT MD VISIT:   Susannah Daring, PT, DPT 06/11/24 12:52 PM

## 2024-06-11 ENCOUNTER — Ambulatory Visit (INDEPENDENT_AMBULATORY_CARE_PROVIDER_SITE_OTHER)

## 2024-06-11 DIAGNOSIS — R2689 Other abnormalities of gait and mobility: Secondary | ICD-10-CM

## 2024-06-11 DIAGNOSIS — M6281 Muscle weakness (generalized): Secondary | ICD-10-CM | POA: Diagnosis not present

## 2024-06-11 DIAGNOSIS — R600 Localized edema: Secondary | ICD-10-CM

## 2024-06-11 DIAGNOSIS — M62838 Other muscle spasm: Secondary | ICD-10-CM

## 2024-06-11 DIAGNOSIS — M25612 Stiffness of left shoulder, not elsewhere classified: Secondary | ICD-10-CM | POA: Diagnosis not present

## 2024-06-11 DIAGNOSIS — M25512 Pain in left shoulder: Secondary | ICD-10-CM | POA: Diagnosis not present

## 2024-06-11 DIAGNOSIS — G8929 Other chronic pain: Secondary | ICD-10-CM

## 2024-06-16 ENCOUNTER — Encounter: Payer: Self-pay | Admitting: Internal Medicine

## 2024-06-16 MED ORDER — VALACYCLOVIR HCL 1 G PO TABS: 1000.0000 mg | ORAL_TABLET | Freq: Three times a day (TID) | ORAL | 0 refills | Status: AC

## 2024-06-18 ENCOUNTER — Encounter: Payer: Self-pay | Admitting: Internal Medicine

## 2024-06-18 ENCOUNTER — Other Ambulatory Visit

## 2024-06-18 ENCOUNTER — Encounter

## 2024-06-19 ENCOUNTER — Ambulatory Visit: Admitting: Podiatry

## 2024-06-20 ENCOUNTER — Encounter: Payer: Self-pay | Admitting: Internal Medicine

## 2024-06-20 ENCOUNTER — Encounter: Payer: Self-pay | Admitting: Emergency Medicine

## 2024-06-20 ENCOUNTER — Encounter

## 2024-06-23 NOTE — Telephone Encounter (Signed)
 No sorry, we should see her in office, and likely have cxr, as MRI for the chest cannot really be added on.   Pt should go to UC or ED for worsening pain, fever, cough, blood or other unusual symptoms.  Thanks

## 2024-06-24 ENCOUNTER — Ambulatory Visit
Admission: RE | Admit: 2024-06-24 | Discharge: 2024-06-24 | Disposition: A | Source: Ambulatory Visit | Attending: Surgical

## 2024-06-24 DIAGNOSIS — M5416 Radiculopathy, lumbar region: Secondary | ICD-10-CM

## 2024-06-25 ENCOUNTER — Other Ambulatory Visit: Payer: Self-pay | Admitting: Internal Medicine

## 2024-06-25 ENCOUNTER — Encounter: Payer: Self-pay | Admitting: Emergency Medicine

## 2024-06-25 ENCOUNTER — Ambulatory Visit: Admitting: Emergency Medicine

## 2024-06-25 ENCOUNTER — Ambulatory Visit (INDEPENDENT_AMBULATORY_CARE_PROVIDER_SITE_OTHER)

## 2024-06-25 ENCOUNTER — Ambulatory Visit: Payer: Self-pay | Admitting: Emergency Medicine

## 2024-06-25 ENCOUNTER — Other Ambulatory Visit: Payer: Self-pay

## 2024-06-25 VITALS — BP 126/80 | HR 84 | Temp 98.2°F | Ht 64.0 in | Wt 216.0 lb

## 2024-06-25 DIAGNOSIS — I10 Essential (primary) hypertension: Secondary | ICD-10-CM | POA: Diagnosis not present

## 2024-06-25 DIAGNOSIS — R079 Chest pain, unspecified: Secondary | ICD-10-CM

## 2024-06-25 DIAGNOSIS — R918 Other nonspecific abnormal finding of lung field: Secondary | ICD-10-CM | POA: Insufficient documentation

## 2024-06-25 LAB — COMPREHENSIVE METABOLIC PANEL WITH GFR
ALT: 50 U/L — ABNORMAL HIGH (ref 0–35)
AST: 37 U/L (ref 0–37)
Albumin: 3.6 g/dL (ref 3.5–5.2)
Alkaline Phosphatase: 120 U/L — ABNORMAL HIGH (ref 39–117)
BUN: 9 mg/dL (ref 6–23)
CO2: 35 meq/L — ABNORMAL HIGH (ref 19–32)
Calcium: 9.9 mg/dL (ref 8.4–10.5)
Chloride: 90 meq/L — ABNORMAL LOW (ref 96–112)
Creatinine, Ser: 0.58 mg/dL (ref 0.40–1.20)
GFR: 92.87 mL/min (ref >60.00)
Glucose, Bld: 91 mg/dL (ref 70–99)
Potassium: 3 meq/L — ABNORMAL LOW (ref 3.5–5.1)
Sodium: 133 meq/L — ABNORMAL LOW (ref 135–145)
Total Bilirubin: 0.4 mg/dL (ref 0.2–1.2)
Total Protein: 7.3 g/dL (ref 6.0–8.3)

## 2024-06-25 LAB — CBC WITH DIFFERENTIAL/PLATELET
Basophils Absolute: 0 K/uL (ref 0.0–0.1)
Basophils Relative: 0.4 % (ref 0.0–3.0)
Eosinophils Absolute: 0.1 K/uL (ref 0.0–0.7)
Eosinophils Relative: 1.5 % (ref 0.0–5.0)
HCT: 38.1 % (ref 36.0–46.0)
Hemoglobin: 13.1 g/dL (ref 12.0–15.0)
Lymphocytes Relative: 19.1 % (ref 12.0–46.0)
Lymphs Abs: 1.6 K/uL (ref 0.7–4.0)
MCHC: 34.4 g/dL (ref 30.0–36.0)
MCV: 89.1 fl (ref 78.0–100.0)
Monocytes Absolute: 0.7 K/uL (ref 0.1–1.0)
Monocytes Relative: 8.6 % (ref 3.0–12.0)
Neutro Abs: 6 K/uL (ref 1.4–7.7)
Neutrophils Relative %: 70.4 % (ref 43.0–77.0)
Platelets: 499 K/uL — ABNORMAL HIGH (ref 150.0–400.0)
RBC: 4.27 Mil/uL (ref 3.87–5.11)
RDW: 13.5 % (ref 11.5–15.5)
WBC: 8.6 K/uL (ref 4.0–10.5)

## 2024-06-25 MED ORDER — AZITHROMYCIN 250 MG PO TABS: ORAL_TABLET | ORAL | 0 refills | Status: AC

## 2024-06-25 NOTE — Assessment & Plan Note (Signed)
 Possible pneumonia.  Recommend azithromycin  for 5 days May also represent area of pulmonary infarction. Patient has several risk factors for PE Has pulmonary evaluation tomorrow morning

## 2024-06-25 NOTE — Assessment & Plan Note (Signed)
 Clinically stable.  Differential diagnosis discussed with patient including possibility of pulmonary embolism History of COPD and still smoking Normal EKG.  No acute ischemic changes Chest x-ray shows left upper lobe infiltrate Patient had episode of hemoptysis and low-grade fever Recommend to start azithromycin  daily for 5 days She has appointment to see pulmonary doctor tomorrow morning Will need CTA of chest ED precautions given Advised to contact the office if no better or worse during the next several days.  Further recommendations will be made after tomorrow's pulmonary evaluation.

## 2024-06-25 NOTE — Patient Instructions (Signed)
Nonspecific Chest Pain Chest pain can be caused by many different conditions. Some causes of chest pain can be life-threatening. These will require treatment right away. Serious causes of chest pain include: Heart attack. A tear in the body's main blood vessel. Redness and swelling (inflammation) around your heart. Blood clot in your lungs. Other causes of chest pain may not be so serious. These include: Heartburn. Anxiety or stress. Damage to bones or muscles in your chest. Lung infections. Chest pain can feel like: Pain or discomfort in your chest. Crushing, pressure, aching, or squeezing pain. Burning or tingling. Dull or sharp pain that is worse when you move, cough, or take a deep breath. Pain or discomfort that is also felt in your back, neck, jaw, shoulder, or arm, or pain that spreads to any of these areas. It is hard to know whether your pain is caused by something that is serious or something that is not so serious. So it is important to see your doctor right away if you have chest pain. Follow these instructions at home: Medicines Take over-the-counter and prescription medicines only as told by your doctor. If you were prescribed an antibiotic medicine, take it as told by your doctor. Do not stop taking the antibiotic even if you start to feel better. Lifestyle  Rest as told by your doctor. Do not use any products that contain nicotine or tobacco, such as cigarettes, e-cigarettes, and chewing tobacco. If you need help quitting, ask your doctor. Do not drink alcohol. Make lifestyle changes as told by your doctor. These may include: Getting regular exercise. Ask your doctor what activities are safe for you. Eating a heart-healthy diet. A diet and nutrition specialist (dietitian) can help you to learn healthy eating options. Staying at a healthy weight. Treating diabetes or high blood pressure, if needed. Lowering your stress. Activities such as yoga and relaxation techniques  can help. General instructions Pay attention to any changes in your symptoms. Tell your doctor about them or any new symptoms. Avoid any activities that cause chest pain. Keep all follow-up visits as told by your doctor. This is important. You may need more testing if your chest pain does not go away. Contact a doctor if: Your chest pain does not go away. You feel depressed. You have a fever. Get help right away if: Your chest pain is worse. You have a cough that gets worse, or you cough up blood. You have very bad (severe) pain in your belly (abdomen). You pass out (faint). You have either of these for no clear reason: Sudden chest discomfort. Sudden discomfort in your arms, back, neck, or jaw. You have shortness of breath at any time. You suddenly start to sweat, or your skin gets clammy. You feel sick to your stomach (nauseous). You throw up (vomit). You suddenly feel lightheaded or dizzy. You feel very weak or tired. Your heart starts to beat fast, or it feels like it is skipping beats. These symptoms may be an emergency. Do not wait to see if the symptoms will go away. Get medical help right away. Call your local emergency services (911 in the [place] Do not drive yourself to the hospital. Summary Chest pain can be caused by many different conditions. The cause may be serious and need treatment right away. If you have chest pain, see your doctor right away. Follow your doctor's instructions for taking medicines and making lifestyle changes. Keep all follow-up visits as told by your doctor. This includes visits for any further   testing if your chest pain does not go away. Be sure to know the signs that show that your condition has become worse. Get help right away if you have these symptoms. This information is not intended to replace advice given to you by your health care provider. Make sure you discuss any questions you have with your health care provider. Document Revised:  05/18/2022 Document Reviewed: 05/18/2022 Elsevier Patient Education  2024 [employer]  

## 2024-06-25 NOTE — Progress Notes (Signed)
 Frances Nelson 68 y.o.   Chief Complaint  Patient presents with   Back Pain    And chest pain pt states that she had woke up out of her sleep with this pain and states that she couldn't breathe and she was gasping for air and she states that she thought she was having a heart attack but she stated that she seen her cardiology and had a echocardiogram done and EKG and everything was fine     HISTORY OF PRESENT ILLNESS: This is a 68 y.o. female developed acute onset left-sided chest pain with radiation to the back 1 week ago on 06/18/2024 Woke up with this sharp pain.  911 called to her home.  Medics that EKG and took vitals.  Stable.  Did not transport her to ED Patient states that on the same day later she had blood in the phlegm when coughing Also developed low-grade fever Smoker with history of COPD States she recently had echocardiogram on 05/12/2024 which looked normal No other associated symptoms Pain still there.  Sharp and pleuritic.  Lesser intensity.  Back Pain Associated symptoms include chest pain and a fever. Pertinent negatives include no abdominal pain, dysuria or headaches.     Prior to Admission medications   Medication Sig Start Date End Date Taking? Authorizing Provider  acetaminophen  (TYLENOL ) 650 MG CR tablet Take 1,300 mg by mouth every 8 (eight) hours.    [provider]  acidophilus (RISAQUAD) CAPS capsule Take 1 capsule by mouth in the morning.    [provider]  albuterol  (PROVENTIL ) (2.5 MG/3ML) 0.083% nebulizer solution Take 3 mLs (2.5 mg total) by nebulization every 6 (six) hours as needed for wheezing or shortness of breath. 01/31/24 01/30/25  Hope Almarie ORN, NP  albuterol  (VENTOLIN  HFA) [phone] Base) MCG/ACT inhaler Inhale 2 puffs into the lungs every 6 (six) hours as needed for wheezing or shortness of breath. 01/02/24   Hope Almarie ORN, NP  alendronate  (FOSAMAX ) 70 MG tablet Take 1 tablet (70 mg total) by mouth every 7 (seven)  days. Take with a full glass of water on an empty stomach. 11/04/23   Norleen Lynwood ORN, MD  Calcium -Vitamins C & D (CALCIUM /C/D PO) Take 1 tablet by mouth in the morning.    [provider]  cetirizine (ZYRTEC) 10 MG tablet Take 10 mg by mouth in the morning.    [provider]  Cholecalciferol (HM VITAMIN D3 PO) Take 5,000 Units by mouth in the morning.    [provider]  cyclobenzaprine  (FLEXERIL ) 10 MG tablet Take 1 tablet (10 mg total) by mouth every 8 (eight) hours. 02/29/24   Addie Cordella Hamilton, MD  DULoxetine  (CYMBALTA ) 30 MG capsule TAKE 1 CAPSULE IN THE MORNING AND 2 CAPSULES IN THE EVENING 12/31/23   Norleen Lynwood ORN, MD  ezetimibe  (ZETIA ) 10 MG tablet TAKE 1 TABLET BY MOUTH EVERY DAY 06/25/24   Norleen Lynwood ORN, MD  gabapentin  (NEURONTIN ) 100 MG capsule TAKE 1 CAPSULE (100 MG TOTAL) BY MOUTH THREE TIMES DAILY. 04/28/24   Norleen Lynwood ORN, MD  lidocaine  (LMX) 4 % cream Apply 1 Application topically as needed.    [provider]  losartan  (COZAAR ) 25 MG tablet TAKE 1 TABLET (25 MG TOTAL) BY MOUTH DAILY. 01/23/24   Norleen Lynwood ORN, MD  MELATONIN PO Take 5 mg by mouth at bedtime.    [provider]  meloxicam  (MOBIC ) 15 MG tablet Take 1 tablet (15 mg total) by mouth daily. 12/18/23  Leonce Katz, DO  methocarbamol  (ROBAXIN ) 750 MG tablet Take 1 tablet (750 mg total) by mouth every 8 (eight) hours as needed for muscle spasms. 02/22/24   Magnant, Carlin CROME, PA-C  mometasone  (NASONEX ) 50 MCG/ACT nasal spray Place 2 sprays into the nose every evening.    [provider]  montelukast  (SINGULAIR ) 10 MG tablet TAKE 1 TABLET BY MOUTH EVERYDAY AT BEDTIME 09/23/23   Byrum, Lamar RAMAN, MD  Multiple Vitamins-Minerals (MULTIVITAMIN WITH MINERALS) tablet Take 1 tablet by mouth in the morning.    [provider]  omeprazole  (PRILOSEC) 40 MG capsule TAKE 1 CAPSULE (40 MG TOTAL) BY MOUTH IN THE MORNING AND AT BEDTIME. 12/27/22   Byrum, Lamar RAMAN, MD  phentermine  37.5  MG capsule Take 1 capsule (37.5 mg total) by mouth every morning. 04/03/24   Norleen Lynwood ORN, MD  traMADol  (ULTRAM ) 50 MG tablet Take 1 tablet (50 mg total) by mouth every 6 (six) hours as needed. 04/03/24   Norleen Lynwood ORN, MD    Allergies  Allergen Reactions   Symbicort  [Budesonide -Formoterol  Fumarate] Other (See Comments)    Patient reported dizziness, nausea, headaches and sore throat while using Symbicort  160. Reported on 09/03/17   Tramadol  Hives    Patient states can take tramadol  with a coating on the med as of 02/15/24.   Celebrex  [Celecoxib ]     Bleeding.     Chocolate Flavoring Agent (Non-Screening)    Ciprofloxacin Nausea And Vomiting    Headache, shaking.    Egg Protein-Containing Drug Products    Flavoring Agent (Non-Screening)     Unknown   Lisinopril  Cough    Pt off med for a week, some improvement   Lyrica [Pregabalin]     Made me out of it.    Penicillins     Patient preference   Wellbutrin [Bupropion]    Codeine Palpitations    Patient Active Problem List   Diagnosis Date Noted   Nail disorder 04/05/2024   Synovitis of left shoulder 03/09/2024   Biceps tendonitis on left 03/09/2024   Degenerative superior labral anterior-to-posterior (SLAP) tear of left shoulder 03/09/2024   Complete tear of left rotator cuff 03/09/2024   Obesity 10/13/2023   Shingles outbreak 05/29/2023   Microhematuria 04/09/2023   Bilateral shoulder pain 04/09/2023   Urethritis 10/05/2022   External otitis of left ear 10/05/2022   Spondylolisthesis of lumbar region 05/18/2022   Smoker 04/08/2022   Deviated nasal septum 04/08/2022   Bilateral pain of leg and foot 04/08/2022   Follicular acne 04/06/2022   Baker cyst, left 04/06/2022   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   Depression 10/04/2021   Left knee pain 10/04/2021   Chronic pain 04/09/2021   Pelvic pain 04/09/2021   HLD (hyperlipidemia) 04/09/2021   Vitamin D  deficiency 04/09/2021    Estrogen deficiency 04/09/2021   Hyperglycemia 04/01/2021   Chronic cough 12/30/2020   GERD (gastroesophageal reflux disease) 11/06/2019   Bilateral leg edema 10/16/2019   Pleuritic chest pain 10/16/2019   Hypertension 10/02/2019   Arthralgia 06/28/2018   Sinusitis 06/28/2018   Renal cyst 05/03/2018   Syncope 05/03/2018   Stress and adjustment reaction 05/03/2018   History of tobacco use 05/02/2018   Primary osteoarthritis of both hands 04/10/2018   Primary osteoarthritis of both knees 04/10/2018   Primary osteoarthritis of both feet 04/10/2018   Achilles tendinitis 04/02/2018   Screening for breast cancer 03/15/2018   Positive ANA (antinuclear antibody) 03/15/2018   Screening for colon  cancer 03/15/2018   Rash 03/15/2018   Achilles tendon pain 11/13/2017   Cervical myelopathy with cervical radiculopathy (HCC) 11/13/2017   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   Allergic rhinitis 06/19/2017   Edema 12/24/2015   Carpal tunnel syndrome, right 12/16/2015   Hypercalcemia 11/11/2015   Pain in both upper extremities 11/11/2015   Drug reaction 10/28/2015   Muscle spasms of both lower extremities 10/28/2015   Nerve pain 10/28/2015   Chronic asthmatic bronchitis with acute exacerbation (HCC) 09/06/2015   Environmental and seasonal allergies 07/17/2014    Past Medical History:  Diagnosis Date   Allergy 01.01.1975   Ambulates with cane    straight cane   Arthritis    Asthma    Bronchitis, obstructive, chronic (HCC)    hx   Cataracts, bilateral    pt has not had surgery to remove as of 02/15/24   Complication of anesthesia    patient states slow to wake up after anesthesia   COPD (chronic obstructive pulmonary disease) (HCC)    Dyspnea    only with exertion   Fibromyalgia    GERD (gastroesophageal reflux disease)    Headache    HLD (hyperlipidemia)    Hypertension    Pneumonia    x several    Past Surgical History:  Procedure Laterality Date   ABDOMINAL HYSTERECTOMY   8.19.1990   APPENDECTOMY  9.1.1980   BICEPT TENODESIS Left 02/21/2024   Procedure: TENODESIS, BICEPS;  Surgeon: Addie Cordella Hamilton, MD;  Location: [facility] OR;  Service: Orthopedics;  Laterality: Left;   CYST EXCISION     upper right side lateral - benign   HERNIA REPAIR     Umbilical x 3   KNEE ARTHROSCOPY W/ MENISCAL REPAIR Left 12/2022   POSTERIOR LUMBAR FUSION 2 WITH HARDWARE REMOVAL Left 02/21/2024   Procedure: ARTHROSCOPY, SHOULDER WITH DEBRIDEMENT;  Surgeon: Addie Cordella Hamilton, MD;  Location: [facility] OR;  Service: Orthopedics;  Laterality: Left;  left shoulder arthroscopy, debridement, biceps tenodesis, mini open rotator cuff tear repair   SHOULDER OPEN ROTATOR CUFF REPAIR Left 02/21/2024   Procedure: REPAIR, ROTATOR CUFF, OPEN;  Surgeon: Addie Cordella Hamilton, MD;  Location: [facility] OR;  Service: Orthopedics;  Laterality: Left;   SPINE SURGERY  05/2021   TONSILLECTOMY     TRANSFORAMINAL LUMBAR INTERBODY FUSION (TLIF) WITH PEDICLE SCREW FIXATION 1 LEVEL N/A 05/18/2022   Procedure: Lumbar Four-Five Open Laminectomy/Transforaminal Lumbar Interbody Fusion/Posterolateral fusion;  Surgeon: Cheryle Debby LABOR, MD;  Location: [facility] OR;  Service: Neurosurgery;  Laterality: N/A;    Social History   Socioeconomic History   Marital status: Single    Spouse name: Not on file   Number of children: 2   Years of education: Not on file   Highest education level: Some college, no degree  Occupational History   Occupation: RETIRED  Tobacco Use   Smoking status: Every Day    Current packs/day: 1.00    Average packs/day: 1 pack/day for 40.9 years (40.9 ttl pk-yrs)    Types: Cigarettes    Start date: 60    Last attempt to quit: 2000    Passive exposure: Never   Smokeless tobacco: Never   Tobacco comments:    1 pack a day    01/23/24-Quit several times totaling 10 yrs but still currently smoking 1 ppd  Vaping Use   Vaping status: Never Used  Substance and Sexual Activity   Alcohol use: No   Drug use: Yes     Types: Amphetamines  Comment: on phentermine  for weight loss   Sexual activity: Not Currently    Birth control/protection: Surgical    Comment: Hysterectomy  Other Topics Concern   Not on file  Social History Narrative   Right Handed    Lives in a two story home - Lives with son       Are you currently employed ?    What is your current occupation?retired   Do you live at home alone?   Who lives with you?    What type of home do you live in: 1 story or 2 story?     Caffeine 1-2 cups a day   Social Drivers of Health   Financial Resource Strain: Medium Risk (04/26/2024)   Overall Financial Resource Strain (CARDIA)    Difficulty of Paying Living Expenses: Somewhat hard  Food Insecurity: No Food Insecurity (04/26/2024)   Hunger Vital Sign    Worried About Running Out of Food in the Last Year: Never true    Ran Out of Food in the Last Year: Never true  Transportation Needs: No Transportation Needs (04/26/2024)   PRAPARE - Administrator, Civil Service (Medical): No    Lack of Transportation (Non-Medical): No  Physical Activity: Insufficiently Active (04/26/2024)   Exercise Vital Sign    Days of Exercise per Week: 2 days    Minutes of Exercise per Session: 60 min  Stress: No Stress Concern Present (04/26/2024)   [employer] of Occupational Health - Occupational Stress Questionnaire    Feeling of Stress: Not at all  Social Connections: Socially Isolated (04/26/2024)   Social Connection and Isolation Panel    Frequency of Communication with Friends and Family: More than three times a week    Frequency of Social Gatherings with Friends and Family: Once a week    Attends Religious Services: Patient declined    Database Administrator or Organizations: No    Attends Banker Meetings: Not on file    Marital Status: Widowed  Intimate Partner Violence: Not At Risk (10/29/2023)   Humiliation, Afraid, Rape, and Kick questionnaire    Fear of Current or  Ex-Partner: No    Emotionally Abused: No    Physically Abused: No    Sexually Abused: No    Family History  Problem Relation Age of Onset   Arthritis Mother    COPD Mother    Depression Mother    Hearing loss Mother    Angina Father    Parkinson's disease Father    Hearing loss Father    Heart disease Father    Leukemia Sister    Kidney disease Sister    Kidney disease Brother    Brain cancer Brother        glioblastoma    Cancer Brother    Allergies Son    Asthma Son    Allergies Son    Drug abuse Son    Early death Son    Asthma Son    Cancer Sister    Cancer Brother    Asthma Son    Cancer Brother    Cancer Sister      Review of Systems  Constitutional:  Positive for fever.  HENT:  Positive for congestion. Negative for sore throat.   Respiratory:  Positive for cough, hemoptysis and shortness of breath.   Cardiovascular:  Positive for chest pain.  Gastrointestinal:  Negative for abdominal pain, diarrhea, nausea and vomiting.  Genitourinary: Negative.  Negative for dysuria and  hematuria.  Musculoskeletal:  Positive for back pain.  Skin: Negative.  Negative for rash.  Neurological: Negative.  Negative for dizziness and headaches.  All other systems reviewed and are negative.   Today's Vitals   06/25/24 1348  BP: 126/80  Pulse: 84  Temp: 98.2 F (36.8 C)  TempSrc: Oral  SpO2: 97%  Weight: 216 lb (98 kg)  Height: 5' 4 (1.626 m)   Body mass index is 37.08 kg/m.   Physical Exam Vitals reviewed.  Constitutional:      Appearance: Normal appearance. She is obese.  HENT:     Head: Normocephalic.     Mouth/Throat:     Mouth: Mucous membranes are moist.     Pharynx: Oropharynx is clear.  Eyes:     Extraocular Movements: Extraocular movements intact.     Pupils: Pupils are equal, round, and reactive to light.  Cardiovascular:     Rate and Rhythm: Normal rate and regular rhythm.     Pulses: Normal pulses.     Heart sounds: Normal heart sounds.   Pulmonary:     Effort: Pulmonary effort is normal.     Breath sounds: Normal breath sounds.  Musculoskeletal:     Cervical back: No tenderness.  Lymphadenopathy:     Cervical: No cervical adenopathy.  Skin:    General: Skin is warm and dry.  Neurological:     General: No focal deficit present.     Mental Status: She is alert and oriented to person, place, and time.  Psychiatric:        Mood and Affect: Mood normal.        Behavior: Behavior normal.    EKG: Normal sinus rhythm with ventricular rate of 82/min.  No acute ischemic changes.  Normal EKG.  DG Chest 2 View Result Date: 06/25/2024 CLINICAL DATA:  Chest pain.  Hemoptysis and low-grade fever. EXAM: CHEST - 2 VIEW COMPARISON:  Chest radiograph dated 10/22/2020. FINDINGS: Patchy area of airspace opacity in the left upper lobe most consistent with developing infiltrate. Clinical correlation and follow-up to resolution recommended. The right lung is clear. No pleural effusion pneumothorax. The cardiac silhouette is within normal limits. Degenerative changes of spine. No acute osseous pathology. IMPRESSION: Left upper lobe infiltrate. Clinical correlation and follow-up to resolution recommended. Electronically Signed   By: Vanetta Chou M.D.   On: 06/25/2024 15:09     ASSESSMENT & PLAN: A total of 43 minutes was spent with the patient and counseling/coordination of care regarding preparing for this visit, review of most recent office visit notes, review of multiple chronic medical conditions and their management, differential diagnosis of left-sided chest pain and need for workup, review of chest x-ray images done today, review of all medications, review of most recent bloodwork results, review of health maintenance items, education on nutrition, prognosis, documentation, and need for follow up.   Problem List Items Addressed This Visit       Cardiovascular and Mediastinum   Hypertension   BP Readings from Last 3 Encounters:   06/25/24 126/80  04/29/24 120/76  04/03/24 126/74  Well-controlled hypertension Continue losartan  25 mg daily Normal EKG.  No findings of hypertensive heart disease         Respiratory   Left upper lobe pulmonary infiltrate   Possible pneumonia.  Recommend azithromycin  for 5 days May also represent area of pulmonary infarction. Patient has several risk factors for PE Has pulmonary evaluation tomorrow morning      Relevant Medications   azithromycin  (  ZITHROMAX ) 250 MG tablet     Other   Nonspecific chest pain - Primary   Clinically stable.  Differential diagnosis discussed with patient including possibility of pulmonary embolism History of COPD and still smoking Normal EKG.  No acute ischemic changes Chest x-ray shows left upper lobe infiltrate Patient had episode of hemoptysis and low-grade fever Recommend to start azithromycin  daily for 5 days She has appointment to see pulmonary doctor tomorrow morning Will need CTA of chest ED precautions given Advised to contact the office if no better or worse during the next several days.  Further recommendations will be made after tomorrow's pulmonary evaluation.      Relevant Orders   DG Chest 2 View (Completed)   EKG 12-Lead   CBC with Differential/Platelet   Comprehensive metabolic panel with GFR   Patient Instructions  Nonspecific Chest Pain Chest pain can be caused by many different conditions. Some causes of chest pain can be life-threatening. These will require treatment right away. Serious causes of chest pain include: Heart attack. A tear in the body's main blood vessel. Redness and swelling (inflammation) around your heart. Blood clot in your lungs. Other causes of chest pain may not be so serious. These include: Heartburn. Anxiety or stress. Damage to bones or muscles in your chest. Lung infections. Chest pain can feel like: Pain or discomfort in your chest. Crushing, pressure, aching, or squeezing  pain. Burning or tingling. Dull or sharp pain that is worse when you move, cough, or take a deep breath. Pain or discomfort that is also felt in your back, neck, jaw, shoulder, or arm, or pain that spreads to any of these areas. It is hard to know whether your pain is caused by something that is serious or something that is not so serious. So it is important to see your doctor right away if you have chest pain. Follow these instructions at home: Medicines Take over-the-counter and prescription medicines only as told by your doctor. If you were prescribed an antibiotic medicine, take it as told by your doctor. Do not stop taking the antibiotic even if you start to feel better. Lifestyle  Rest as told by your doctor. Do not use any products that contain nicotine or tobacco, such as cigarettes, e-cigarettes, and chewing tobacco. If you need help quitting, ask your doctor. Do not drink alcohol. Make lifestyle changes as told by your doctor. These may include: Getting regular exercise. Ask your doctor what activities are safe for you. Eating a heart-healthy diet. A diet and nutrition specialist (dietitian) can help you to learn healthy eating options. Staying at a healthy weight. Treating diabetes or high blood pressure, if needed. Lowering your stress. Activities such as yoga and relaxation techniques can help. General instructions Pay attention to any changes in your symptoms. Tell your doctor about them or any new symptoms. Avoid any activities that cause chest pain. Keep all follow-up visits as told by your doctor. This is important. You may need more testing if your chest pain does not go away. Contact a doctor if: Your chest pain does not go away. You feel depressed. You have a fever. Get help right away if: Your chest pain is worse. You have a cough that gets worse, or you cough up blood. You have very bad (severe) pain in your belly (abdomen). You pass out (faint). You have either  of these for no clear reason: Sudden chest discomfort. Sudden discomfort in your arms, back, neck, or jaw. You have shortness of  breath at any time. You suddenly start to sweat, or your skin gets clammy. You feel sick to your stomach (nauseous). You throw up (vomit). You suddenly feel lightheaded or dizzy. You feel very weak or tired. Your heart starts to beat fast, or it feels like it is skipping beats. These symptoms may be an emergency. Do not wait to see if the symptoms will go away. Get medical help right away. Call your local emergency services (911 in the [place] Do not drive yourself to the hospital. Summary Chest pain can be caused by many different conditions. The cause may be serious and need treatment right away. If you have chest pain, see your doctor right away. Follow your doctor's instructions for taking medicines and making lifestyle changes. Keep all follow-up visits as told by your doctor. This includes visits for any further testing if your chest pain does not go away. Be sure to know the signs that show that your condition has become worse. Get help right away if you have these symptoms. This information is not intended to replace advice given to you by your health care provider. Make sure you discuss any questions you have with your health care provider. Document Revised: 05/18/2022 Document Reviewed: 05/18/2022 Elsevier Patient Education  2024 Elsevier Inc.    Emil Schaumann, MD [facility]

## 2024-06-25 NOTE — Assessment & Plan Note (Addendum)
 BP Readings from Last 3 Encounters:  06/25/24 126/80  04/29/24 120/76  04/03/24 126/74  Well-controlled hypertension Continue losartan  25 mg daily Normal EKG.  No findings of hypertensive heart disease

## 2024-06-26 ENCOUNTER — Encounter: Payer: Self-pay | Admitting: Emergency Medicine

## 2024-06-27 ENCOUNTER — Encounter: Payer: Self-pay | Admitting: Emergency Medicine

## 2024-06-27 ENCOUNTER — Ambulatory Visit: Payer: Self-pay | Admitting: Surgical

## 2024-06-27 ENCOUNTER — Ambulatory Visit: Admitting: Surgical

## 2024-06-27 ENCOUNTER — Ambulatory Visit: Admitting: Emergency Medicine

## 2024-06-27 ENCOUNTER — Other Ambulatory Visit: Payer: Self-pay | Admitting: Radiology

## 2024-06-27 VITALS — BP 122/72 | HR 81 | Temp 98.7°F | Ht 64.0 in | Wt 216.8 lb

## 2024-06-27 DIAGNOSIS — R918 Other nonspecific abnormal finding of lung field: Secondary | ICD-10-CM | POA: Diagnosis not present

## 2024-06-27 DIAGNOSIS — J4489 Other specified chronic obstructive pulmonary disease: Secondary | ICD-10-CM

## 2024-06-27 DIAGNOSIS — F17210 Nicotine dependence, cigarettes, uncomplicated: Secondary | ICD-10-CM | POA: Diagnosis not present

## 2024-06-27 DIAGNOSIS — Z87891 Personal history of nicotine dependence: Secondary | ICD-10-CM

## 2024-06-27 DIAGNOSIS — M5416 Radiculopathy, lumbar region: Secondary | ICD-10-CM

## 2024-06-27 MED ORDER — TRAMADOL HCL 50 MG PO TABS: 50.0000 mg | ORAL_TABLET | Freq: Four times a day (QID) | ORAL | 1 refills | Status: AC | PRN

## 2024-06-27 MED ORDER — DOXYCYCLINE HYCLATE 100 MG PO TABS: 100.0000 mg | ORAL_TABLET | Freq: Two times a day (BID) | ORAL | 0 refills | Status: DC

## 2024-06-27 MED ORDER — PROMETHAZINE-DM 6.25-15 MG/5ML PO SYRP: 5.0000 mL | ORAL_SOLUTION | Freq: Four times a day (QID) | ORAL | 0 refills | Status: AC | PRN

## 2024-06-27 NOTE — Telephone Encounter (Signed)
 Please advise

## 2024-06-27 NOTE — Progress Notes (Signed)
 Walterine Mullet, I called Frances Nelson and she has pneumonia.  Can you cancel her appointment for 12/15?  Also she needs injection with Dr. Eldonna for lumbar spine ESI and an injection to meet Dr. Georgina for about 2 weeks after the lumbar spine ESI

## 2024-06-27 NOTE — Assessment & Plan Note (Signed)
 Chest x-ray and clinical history consistent with a left upper lobe CAP.  She was given azithromycin , I would like to change this to doxycycline  to complete a 10-day course.  Can use Promethazine DM for cough suppression, Ultram  for pain which I filled.  She will follow-up in 2 to 3 weeks to see how she is doing clinically.  If she is improving then we will make plans for a 6-week follow-up chest x-ray.  If she is still having difficulty I think she needs a CT scan of the chest to better characterize the infiltrate, consider CT-PA to rule out pulmonary embolism although low suspicion given the parenchymal findings.   We reviewed your chest x-ray today. Please stop azithromycin  Please start doxycycline  100 mg twice a day.  We will treat for 10 days. We will send a prescription for promethazine/DM for cough suppression You can use Tylenol  and Ultram  for pain control.  We will write a refill for the Ultram . Follow-up in our office in 2-3 weeks to see how you are doing.  If you are still having problems at that time then we will arrange for a CT scan of your chest.  If you are improving from this pneumonia then we will plan the timing of a follow-up chest x-ray, probably at the 6-week mark.

## 2024-06-27 NOTE — Telephone Encounter (Signed)
Ok with me, thanks.

## 2024-06-27 NOTE — Patient Instructions (Signed)
 We reviewed your chest x-ray today. Please stop azithromycin  Please start doxycycline  100 mg twice a day.  We will treat for 10 days. Continue to use your albuterol  up to every 4 hours if needed for shortness of breath, chest tightness, wheezing. We will send a prescription for promethazine/DM for cough suppression You can use Tylenol  and Ultram  for pain control.  We will write a refill for the Ultram . Follow-up in our office in 2-3 weeks to see how you are doing.  If you are still having problems at that time then we will arrange for a CT scan of your chest.  If you are improving from this pneumonia then we will plan the timing of a follow-up chest x-ray, probably at the 6-week mark.

## 2024-06-27 NOTE — Assessment & Plan Note (Signed)
 She desperately needs to stop smoking.  We talked about this today.  She feels that she may be able to cut down but she is not ready to cut down significantly or set a quit date.

## 2024-06-27 NOTE — Telephone Encounter (Signed)
 Negative.  I am not accepting new patients at this time.

## 2024-06-27 NOTE — Progress Notes (Signed)
 Subjective:    Patient ID: Frances Nelson, female    DOB: 1955-12-05, 68 y.o.   MRN: [id number]  COPD She complains of cough and shortness of breath. There is no wheezing. Pertinent negatives include no ear pain, fever, headaches, postnasal drip, rhinorrhea, sneezing, sore throat or trouble swallowing. Her past medical history is significant for COPD.      ROV 06/27/2024 --follow-up visit 68 year old woman who has a history of active tobacco use and associated COPD, chronic cough due to this as well as rhinitis, GERD.  She has a positive p-ANCA and ANA without any known pulmonary manifestations. She is being managed on She experienced an episode of acute dyspnea and chest pain last Wednesday, EMS came but she did not want to go to hospital. The pain persisted through the day and then subsided. Associated w some cough later in the day. She saw Dr Purcell 12/10, was just started on azithromycin . She is having L chest and back pain. Coughing up less mucous, but did see blood last pm. She has stopped the Breyna  - too expensive ($141), usingalbuterol  about 3-4 x day during this illness. Smoking about 1 pk/day.   CXR 12/10 reviewed by me, shows an evolving LUL infiltrate.   Lung cancer screening CT chest done 01/24/2024 reviewed by me showed RADS 2 study with no effusion or pneumothorax.  She does have centrilobular emphysema and diffuse bronchial wall thickening, a few scattered tiny pulmonary nodules largest 3 mm in the peripheral right middle lobe    Review of Systems  Constitutional:  Negative for fever and unexpected weight change.  HENT:  Positive for congestion. Negative for dental problem, ear pain, nosebleeds, postnasal drip, rhinorrhea, sinus pressure, sneezing, sore throat and trouble swallowing.   Eyes:  Negative for redness and itching.  Respiratory:  Positive for cough and shortness of breath. Negative for chest tightness and wheezing.   Cardiovascular:  Negative for palpitations  and leg swelling.  Gastrointestinal:  Negative for nausea and vomiting.  Genitourinary:  Negative for dysuria.  Musculoskeletal:  Negative for joint swelling.  Skin:  Negative for rash.  Neurological:  Negative for headaches.  Hematological:  Does not bruise/bleed easily.  Psychiatric/Behavioral:  Negative for dysphoric mood. The patient is not nervous/anxious.        Objective:   Physical Exam Vitals:   06/27/24 1014  BP: 122/72  Pulse: 81  Temp: 98.7 F (37.1 C)  SpO2: 98%  Weight: 216 lb 12.8 oz (98.3 kg)  Height: 5' 4 (1.626 m)     Gen: Pleasant, overweight woman, in no distress,  normal affect  ENT: No lesions,  mouth clear,  oropharynx clear, no postnasal drip, hoarse voice  Neck: No JVD, no stridor  Lungs: No use of accessory muscles, distant clear  Cardiovascular: RRR, heart sounds normal, no murmur or gallops, no peripheral edema  Musculoskeletal: No deformities, no cyanosis or clubbing.   Neuro: alert, non focal  Skin: Warm, no lesions or rash      Assessment & Plan:  Left upper lobe pulmonary infiltrate Chest x-ray and clinical history consistent with a left upper lobe CAP.  She was given azithromycin , I would like to change this to doxycycline  to complete a 10-day course.  Can use Promethazine DM for cough suppression, Ultram  for pain which I filled.  She will follow-up in 2 to 3 weeks to see how she is doing clinically.  If she is improving then we will make plans for a 6-week follow-up  chest x-ray.  If she is still having difficulty I think she needs a CT scan of the chest to better characterize the infiltrate, consider CT-PA to rule out pulmonary embolism although low suspicion given the parenchymal findings.   We reviewed your chest x-ray today. Please stop azithromycin  Please start doxycycline  100 mg twice a day.  We will treat for 10 days. We will send a prescription for promethazine/DM for cough suppression You can use Tylenol  and Ultram  for  pain control.  We will write a refill for the Ultram . Follow-up in our office in 2-3 weeks to see how you are doing.  If you are still having problems at that time then we will arrange for a CT scan of your chest.  If you are improving from this pneumonia then we will plan the timing of a follow-up chest x-ray, probably at the 6-week mark.  COPD with asthma (HCC) She is not currently on any scheduled inhaled medication, cost and side effects have been prohibitive no matter what we have tried.  She is using albuterol , typically a few times a week but 3-4 times a day during this illness.  History of tobacco use She desperately needs to stop smoking.  We talked about this today.  She feels that she may be able to cut down but she is not ready to cut down significantly or set a quit date.   I personally spent a total of 35 minutes in the care of the patient today including preparing to see the patient, getting/reviewing separately obtained history, performing a medically appropriate exam/evaluation, counseling and educating, placing orders, documenting clinical information in the EHR, independently interpreting results, and communicating results.   Lamar Chris, MD, PhD 06/27/2024, 12:36 PM [facility] [phone] or if no answer [phone]

## 2024-06-27 NOTE — Assessment & Plan Note (Signed)
 She is not currently on any scheduled inhaled medication, cost and side effects have been prohibitive no matter what we have tried.  She is using albuterol , typically a few times a week but 3-4 times a day during this illness.

## 2024-06-30 ENCOUNTER — Ambulatory Visit: Admitting: Surgical

## 2024-07-01 ENCOUNTER — Ambulatory Visit (INDEPENDENT_AMBULATORY_CARE_PROVIDER_SITE_OTHER)

## 2024-07-01 ENCOUNTER — Encounter: Admitting: Physical Medicine & Rehabilitation

## 2024-07-01 ENCOUNTER — Encounter: Payer: Self-pay | Admitting: Emergency Medicine

## 2024-07-01 ENCOUNTER — Ambulatory Visit: Admitting: Emergency Medicine

## 2024-07-01 ENCOUNTER — Ambulatory Visit: Payer: Self-pay | Admitting: Emergency Medicine

## 2024-07-01 VITALS — BP 126/82 | HR 87 | Temp 98.1°F | Ht 64.0 in | Wt 216.0 lb

## 2024-07-01 DIAGNOSIS — J449 Chronic obstructive pulmonary disease, unspecified: Secondary | ICD-10-CM

## 2024-07-01 DIAGNOSIS — J189 Pneumonia, unspecified organism: Secondary | ICD-10-CM | POA: Insufficient documentation

## 2024-07-01 LAB — CBC WITH DIFFERENTIAL/PLATELET
Basophils Absolute: 0 K/uL (ref 0.0–0.1)
Basophils Relative: 0.8 % (ref 0.0–3.0)
Eosinophils Absolute: 0.1 K/uL (ref 0.0–0.7)
Eosinophils Relative: 2 % (ref 0.0–5.0)
HCT: 41.1 % (ref 36.0–46.0)
Hemoglobin: 13.6 g/dL (ref 12.0–15.0)
Lymphocytes Relative: 29.6 % (ref 12.0–46.0)
Lymphs Abs: 1.8 K/uL (ref 0.7–4.0)
MCHC: 33.2 g/dL (ref 30.0–36.0)
MCV: 91 fl (ref 78.0–100.0)
Monocytes Absolute: 0.4 K/uL (ref 0.1–1.0)
Monocytes Relative: 6 % (ref 3.0–12.0)
Neutro Abs: 3.8 K/uL (ref 1.4–7.7)
Neutrophils Relative %: 61.6 % (ref 43.0–77.0)
Platelets: 698 K/uL — ABNORMAL HIGH (ref 150.0–400.0)
RBC: 4.52 Mil/uL (ref 3.87–5.11)
RDW: 14.1 % (ref 11.5–15.5)
WBC: 6.2 K/uL (ref 4.0–10.5)

## 2024-07-01 LAB — COMPREHENSIVE METABOLIC PANEL WITH GFR
ALT: 32 U/L (ref 3–35)
AST: 22 U/L (ref 5–37)
Albumin: 3.8 g/dL (ref 3.5–5.2)
Alkaline Phosphatase: 97 U/L (ref 39–117)
BUN: 12 mg/dL (ref 6–23)
CO2: 31 meq/L (ref 19–32)
Calcium: 9.5 mg/dL (ref 8.4–10.5)
Chloride: 97 meq/L (ref 96–112)
Creatinine, Ser: 0.59 mg/dL (ref 0.40–1.20)
GFR: 92.48 mL/min (ref >60.00)
Glucose, Bld: 72 mg/dL (ref 70–99)
Potassium: 3.6 meq/L (ref 3.5–5.1)
Sodium: 136 meq/L (ref 135–145)
Total Bilirubin: 0.4 mg/dL (ref 0.2–1.2)
Total Protein: 7.3 g/dL (ref 6.0–8.3)

## 2024-07-01 NOTE — Progress Notes (Signed)
 Frances Nelson 68 y.o.   Chief Complaint  Patient presents with   Follow-up    HISTORY OF PRESENT ILLNESS: This is a 68 y.o. female here for follow-up of visit on 06/25/2024 when she presented with symptoms of pneumonia Was able to follow-up with pulmonary doctor 2 days later and pneumonia was confirmed clinically Took azithromycin  for 3 days and then was switched to doxycycline  which she is still taking. Feeling much better today. Has history of COPD.  Smoking less. No other complaints or medical concerns today.  HPI   Prior to Admission medications  Medication Sig Start Date End Date Taking? Authorizing Provider  acetaminophen  (TYLENOL ) 650 MG CR tablet Take 1,300 mg by mouth every 8 (eight) hours.   Yes [provider]  acidophilus (RISAQUAD) CAPS capsule Take 1 capsule by mouth in the morning.   Yes [provider]  albuterol  (PROVENTIL ) (2.5 MG/3ML) 0.083% nebulizer solution Take 3 mLs (2.5 mg total) by nebulization every 6 (six) hours as needed for wheezing or shortness of breath. 01/31/24 01/30/25 Yes Hope Almarie ORN, NP  albuterol  (VENTOLIN  HFA) 108 (90 Base) MCG/ACT inhaler Inhale 2 puffs into the lungs every 6 (six) hours as needed for wheezing or shortness of breath. 01/02/24  Yes Hope Almarie ORN, NP  alendronate  (FOSAMAX ) 70 MG tablet Take 1 tablet (70 mg total) by mouth every 7 (seven) days. Take with a full glass of water on an empty stomach. 11/04/23  Yes Norleen Lynwood ORN, MD  Calcium -Vitamins C & D (CALCIUM /C/D PO) Take 1 tablet by mouth in the morning.   Yes [provider]  cetirizine (ZYRTEC) 10 MG tablet Take 10 mg by mouth in the morning.   Yes [provider]  Cholecalciferol (HM VITAMIN D3 PO) Take 5,000 Units by mouth in the morning.   Yes [provider]  cyclobenzaprine  (FLEXERIL ) 10 MG tablet Take 1 tablet (10 mg total) by mouth every 8 (eight) hours. 02/29/24  Yes Dean, Cordella Hamilton, MD  doxycycline  (VIBRA -TABS)  100 MG tablet Take 1 tablet (100 mg total) by mouth 2 (two) times daily. 06/27/24  Yes Shelah Lamar RAMAN, MD  DULoxetine  (CYMBALTA ) 30 MG capsule TAKE 1 CAPSULE IN THE MORNING AND 2 CAPSULES IN THE EVENING 12/31/23  Yes Norleen Lynwood ORN, MD  ezetimibe  (ZETIA ) 10 MG tablet TAKE 1 TABLET BY MOUTH EVERY DAY 06/25/24  Yes Norleen Lynwood ORN, MD  gabapentin  (NEURONTIN ) 100 MG capsule TAKE 1 CAPSULE (100 MG TOTAL) BY MOUTH THREE TIMES DAILY. 04/28/24  Yes Norleen Lynwood ORN, MD  lidocaine  (LMX) 4 % cream Apply 1 Application topically as needed.   Yes [provider]  losartan  (COZAAR ) 25 MG tablet TAKE 1 TABLET (25 MG TOTAL) BY MOUTH DAILY. 01/23/24  Yes Norleen Lynwood ORN, MD  MELATONIN PO Take 5 mg by mouth at bedtime.   Yes [provider]  meloxicam  (MOBIC ) 15 MG tablet Take 1 tablet (15 mg total) by mouth daily. 12/18/23  Yes Leonce Katz, DO  methocarbamol  (ROBAXIN ) 750 MG tablet Take 1 tablet (750 mg total) by mouth every 8 (eight) hours as needed for muscle spasms. 02/22/24  Yes Magnant, Carlin CROME, PA-C  mometasone  (NASONEX ) 50 MCG/ACT nasal spray Place 2 sprays into the nose every evening.   Yes [provider]  montelukast  (SINGULAIR ) 10 MG tablet TAKE 1 TABLET BY MOUTH EVERYDAY AT BEDTIME 09/23/23  Yes Byrum, Robert S, MD  Multiple Vitamins-Minerals (MULTIVITAMIN WITH MINERALS) tablet Take 1 tablet by mouth in the  morning.   Yes [provider]  omeprazole  (PRILOSEC) 40 MG capsule TAKE 1 CAPSULE (40 MG TOTAL) BY MOUTH IN THE MORNING AND AT BEDTIME. 12/27/22  Yes Byrum, Lamar RAMAN, MD  phentermine  37.5 MG capsule Take 1 capsule (37.5 mg total) by mouth every morning. 04/03/24  Yes Norleen Lynwood ORN, MD  promethazine -dextromethorphan (PROMETHAZINE -DM) 6.25-15 MG/5ML syrup Take 5 mLs by mouth 4 (four) times daily as needed for cough. 06/27/24  Yes Shelah Lamar RAMAN, MD  traMADol  (ULTRAM ) 50 MG tablet Take 1 tablet (50 mg total) by mouth every 6 (six) hours as needed. 06/27/24  Yes Shelah Lamar RAMAN,  MD    Allergies[1]  Patient Active Problem List   Diagnosis Date Noted   Left upper lobe pulmonary infiltrate 06/25/2024   Nail disorder 04/05/2024   Synovitis of left shoulder 03/09/2024   Biceps tendonitis on left 03/09/2024   Degenerative superior labral anterior-to-posterior (SLAP) tear of left shoulder 03/09/2024   Complete tear of left rotator cuff 03/09/2024   Obesity 10/13/2023   Microhematuria 04/09/2023   Bilateral shoulder pain 04/09/2023   Urethritis 10/05/2022   Spondylolisthesis of lumbar region 05/18/2022   Smoker 04/08/2022   Deviated nasal septum 04/08/2022   Bilateral pain of leg and foot 04/08/2022   Follicular acne 04/06/2022   Baker cyst, left 04/06/2022   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   Depression 10/04/2021   Left knee pain 10/04/2021   Chronic pain 04/09/2021   Pelvic pain 04/09/2021   HLD (hyperlipidemia) 04/09/2021   Vitamin D  deficiency 04/09/2021   Estrogen deficiency 04/09/2021   Chronic cough 12/30/2020   GERD (gastroesophageal reflux disease) 11/06/2019   Bilateral leg edema 10/16/2019   Nonspecific chest pain 10/16/2019   Hypertension 10/02/2019   Arthralgia 06/28/2018   Renal cyst 05/03/2018   Syncope 05/03/2018   Stress and adjustment reaction 05/03/2018   History of tobacco use 05/02/2018   Primary osteoarthritis of both hands 04/10/2018   Primary osteoarthritis of both knees 04/10/2018   Primary osteoarthritis of both feet 04/10/2018   Screening for breast cancer 03/15/2018   Positive ANA (antinuclear antibody) 03/15/2018   Screening for colon cancer 03/15/2018   Rash 03/15/2018   Achilles tendon pain 11/13/2017   Cervical myelopathy with cervical radiculopathy (HCC) 11/13/2017   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   Allergic rhinitis 06/19/2017   Edema 12/24/2015   Carpal tunnel syndrome, right 12/16/2015   Hypercalcemia 11/11/2015   Pain in both upper extremities 11/11/2015   Drug  reaction 10/28/2015   Muscle spasms of both lower extremities 10/28/2015   Nerve pain 10/28/2015   Chronic asthmatic bronchitis with acute exacerbation (HCC) 09/06/2015   Environmental and seasonal allergies 07/17/2014    Past Medical History:  Diagnosis Date   Allergy 01.01.1975   Ambulates with cane    straight cane   Arthritis    Asthma    Bronchitis, obstructive, chronic (HCC)    hx   Cataracts, bilateral    pt has not had surgery to remove as of 02/15/24   Complication of anesthesia    patient states slow to wake up after anesthesia   COPD (chronic obstructive pulmonary disease) (HCC)    Dyspnea    only with exertion   Fibromyalgia    GERD (gastroesophageal reflux disease)    Headache    HLD (hyperlipidemia)    Hypertension    Pneumonia    x several    Past Surgical History:  Procedure Laterality Date  ABDOMINAL HYSTERECTOMY  8.19.1990   APPENDECTOMY  9.1.1980   BICEPT TENODESIS Left 02/21/2024   Procedure: TENODESIS, BICEPS;  Surgeon: Addie Cordella Hamilton, MD;  Location: [facility] OR;  Service: Orthopedics;  Laterality: Left;   CYST EXCISION     upper right side lateral - benign   HERNIA REPAIR     Umbilical x 3   KNEE ARTHROSCOPY W/ MENISCAL REPAIR Left 12/2022   POSTERIOR LUMBAR FUSION 2 WITH HARDWARE REMOVAL Left 02/21/2024   Procedure: ARTHROSCOPY, SHOULDER WITH DEBRIDEMENT;  Surgeon: Addie Cordella Hamilton, MD;  Location[facility] OR;  Service: Orthopedics;  Laterality: Left;  left shoulder arthroscopy, debridement, biceps tenodesis, mini open rotator cuff tear repair   SHOULDER OPEN ROTATOR CUFF REPAIR Left 02/21/2024   Procedure: REPAIR, ROTATOR CUFF, OPEN;  Surgeon: Addie Cordella Hamilton, MD;  Location: [facility] OR;  Service: Orthopedics;  Laterality: Left;   SPINE SURGERY  05/2021   TONSILLECTOMY     TRANSFORAMINAL LUMBAR INTERBODY FUSION (TLIF) WITH PEDICLE SCREW FIXATION 1 LEVEL N/A 05/18/2022   Procedure: Lumbar Four-Five Open Laminectomy/Transforaminal Lumbar Interbody  Fusion/Posterolateral fusion;  Surgeon: Cheryle Debby LABOR, MD;  Location: [facility] OR;  Service: Neurosurgery;  Laterality: N/A;    Social History   Socioeconomic History   Marital status: Single    Spouse name: Not on file   Number of children: 2   Years of education: Not on file   Highest education level: Some college, no degree  Occupational History   Occupation: RETIRED  Tobacco Use   Smoking status: Every Day    Current packs/day: 1.00    Average packs/day: 1 pack/day for 41.0 years (41.0 ttl pk-yrs)    Types: Cigarettes    Start date: 97    Last attempt to quit: 2000    Passive exposure: Never   Smokeless tobacco: Never   Tobacco comments:    1 pack a day    01/23/24-Quit several times totaling 10 yrs but still currently smoking 1 ppd  Vaping Use   Vaping status: Never Used  Substance and Sexual Activity   Alcohol use: No   Drug use: Yes    Types: Amphetamines    Comment: on phentermine  for weight loss   Sexual activity: Not Currently    Birth control/protection: Surgical    Comment: Hysterectomy  Other Topics Concern   Not on file  Social History Narrative   Right Handed    Lives in a two story home - Lives with son       Are you currently employed ?    What is your current occupation?retired   Do you live at home alone?   Who lives with you?    What type of home do you live in: 1 story or 2 story?     Caffeine 1-2 cups a day   Social Drivers of Health   Tobacco Use: High Risk (07/01/2024)   Patient History    Smoking Tobacco Use: Every Day    Smokeless Tobacco Use: Never    Passive Exposure: Never  Financial Resource Strain: Medium Risk (04/26/2024)   Overall Financial Resource Strain (CARDIA)    Difficulty of Paying Living Expenses: Somewhat hard  Food Insecurity: No Food Insecurity (04/26/2024)   Epic    Worried About Programme Researcher, Broadcasting/film/video in the Last Year: Never true    Ran Out of Food in the Last Year: Never true  Transportation Needs: No  Transportation Needs (04/26/2024)   Epic    Lack of Transportation (Medical):  No    Lack of Transportation (Non-Medical): No  Physical Activity: Insufficiently Active (04/26/2024)   Exercise Vital Sign    Days of Exercise per Week: 2 days    Minutes of Exercise per Session: 60 min  Stress: No Stress Concern Present (04/26/2024)   [employer] of Occupational Health - Occupational Stress Questionnaire    Feeling of Stress: Not at all  Social Connections: Socially Isolated (04/26/2024)   Social Connection and Isolation Panel    Frequency of Communication with Friends and Family: More than three times a week    Frequency of Social Gatherings with Friends and Family: Once a week    Attends Religious Services: Patient declined    Database Administrator or Organizations: No    Attends Banker Meetings: Not on file    Marital Status: Widowed  Intimate Partner Violence: Not At Risk (10/29/2023)   Humiliation, Afraid, Rape, and Kick questionnaire    Fear of Current or Ex-Partner: No    Emotionally Abused: No    Physically Abused: No    Sexually Abused: No  Depression (PHQ2-9): Low Risk (04/29/2024)   Depression (PHQ2-9)    PHQ-2 Score: 0  Recent Concern: Depression (PHQ2-9) - Medium Risk (04/03/2024)   Depression (PHQ2-9)    PHQ-2 Score: 9  Alcohol Screen: Low Risk (10/29/2023)   Alcohol Screen    Last Alcohol Screening Score (AUDIT): 0  Housing: Low Risk (04/26/2024)   Epic    Unable to Pay for Housing in the Last Year: No    Number of Times Moved in the Last Year: 0    Homeless in the Last Year: No  Utilities: Not At Risk (10/29/2023)   [facility] Utilities    Threatened with loss of utilities: No  Health Literacy: Adequate Health Literacy (10/29/2023)   B1300 Health Literacy    Frequency of need for help with medical instructions: Never    Family History  Problem Relation Age of Onset   Arthritis Mother    COPD Mother    Depression Mother    Hearing loss Mother     Angina Father    Parkinson's disease Father    Hearing loss Father    Heart disease Father    Leukemia Sister    Kidney disease Sister    Kidney disease Brother    Brain cancer Brother        glioblastoma    Cancer Brother    Allergies Son    Asthma Son    Allergies Son    Drug abuse Son    Early death Son    Asthma Son    Cancer Sister    Cancer Brother    Asthma Son    Cancer Brother    Cancer Sister      Review of Systems  Constitutional: Negative.  Negative for chills and fever.  HENT: Negative.  Negative for congestion and sore throat.   Respiratory: Negative.  Negative for cough and shortness of breath.   Cardiovascular: Negative.  Negative for chest pain and palpitations.  Gastrointestinal:  Negative for abdominal pain, diarrhea, nausea and vomiting.  Skin: Negative.  Negative for rash.  Neurological: Negative.  Negative for dizziness and headaches.  All other systems reviewed and are negative.   Vitals:   07/01/24 1059  BP: 126/82  Pulse: 87  Temp: 98.1 F (36.7 C)  SpO2: 98%    Physical Exam Vitals reviewed.  Constitutional:      Appearance: Normal appearance.  HENT:     Head: Normocephalic.     Mouth/Throat:     Mouth: Mucous membranes are moist.     Pharynx: Oropharynx is clear.  Eyes:     Extraocular Movements: Extraocular movements intact.     Pupils: Pupils are equal, round, and reactive to light.  Cardiovascular:     Rate and Rhythm: Normal rate and regular rhythm.     Pulses: Normal pulses.     Heart sounds: Normal heart sounds.  Pulmonary:     Effort: Pulmonary effort is normal.     Breath sounds: Normal breath sounds.  Musculoskeletal:     Cervical back: No tenderness.  Lymphadenopathy:     Cervical: No cervical adenopathy.  Skin:    General: Skin is warm and dry.  Neurological:     Mental Status: She is alert and oriented to person, place, and time.  Psychiatric:        Mood and Affect: Mood normal.        Behavior: Behavior  normal.   DG Chest 2 View Result Date: 07/01/2024 CLINICAL DATA:  Cough, pneumonia EXAM: CHEST - 2 VIEW COMPARISON:  Six days ago FINDINGS: Mildly decreased left perihilar opacity is noted suggesting improving pneumonia. Continued radiographic follow-up is recommended to ensure complete resolution. IMPRESSION: Mildly decreased left perihilar opacity is noted suggesting improving pneumonia. Followup PA and lateral chest X-ray is recommended in 3-4 weeks following trial of antibiotic therapy to ensure resolution and exclude underlying malignancy. Electronically Signed   By: Lynwood Landy Raddle M.D.   On: 07/01/2024 11:52      ASSESSMENT & PLAN: Problem List Items Addressed This Visit       Respiratory   Community acquired pneumonia of left upper lobe of lung - Primary   Clinically stable.  Much improved. Clear lungs on auscultation today.  Afebrile. Blood work results from last week reviewed with patient It showed electrolyte abnormalities and increased platelets most likely secondary to acute illness.  Recommend repeated today Will also repeat chest x-ray today Continues on doxycycline  100 mg twice a day Smoking less. Advised to stay well-hydrated. Was able to follow-up with pulmonary doctor and has video follow-up appointment in 2 weeks At some point will need CT of chest for cancer screening as well Needs to also follow-up with her PCP.      Relevant Orders   DG Chest 2 View   CBC with Differential/Platelet   Comprehensive metabolic panel with GFR   Patient Instructions  Community-Acquired Pneumonia, Adult Pneumonia is an infection of the lungs. It causes irritation and swelling in the airways of the lungs. Mucus and fluid may also build up inside the airways. This may cause coughing and trouble breathing. One type of pneumonia can happen while you are in a hospital. A different type can happen when you are not in a hospital (community-acquired pneumonia). What are the  causes?  This condition is caused by germs (viruses, bacteria, or fungi). Some types of germs can spread from person to person. Pneumonia is not thought to spread from person to person. What increases the risk? You have a long-term (chronic) disease, such as: Disease of the lungs. This may be chronic obstructive pulmonary disease (COPD) or asthma. Heart failure. Cystic fibrosis. Diabetes. Kidney disease. Sickle cell disease. HIV. You have other health problems, such as: Your body's defense system (immune system) is weak. A condition that may cause you to breathe in fluids from your mouth and nose. You had your spleen  taken out. You do not take good care of your teeth and mouth (poor dental hygiene). You use or have used tobacco products. You go where the germs that cause this illness are common. You are older than 68 years of age. What are the signs or symptoms? A cough. A fever. Sweating or chills. Chest pain, often when you breathe deeply or cough. Breathing problems, such as: Fast breathing. Trouble breathing. Shortness of breath. Feeling tired (fatigued). Muscle aches. How is this treated? Treatment for this condition depends on many things, such as: The cause of your illness. Your medicines. Your other health problems. Most adults can be treated at home. Sometimes, treatment must happen in a hospital. Treatment may include medicines to kill germs. Medicines may depend on which germ caused your illness. Very bad pneumonia is rare. If you get it, you may: Have a machine to help you breathe. Have fluid taken away from around your lungs. Follow these instructions at home: Medicines Take over-the-counter and prescription medicines only as told by your doctor. Take cough medicine only if you are losing sleep. Cough medicine can keep your body from taking mucus away from your lungs. If you were prescribed antibiotics, take them as told by your doctor. Do not stop taking  them even if you start to feel better. Lifestyle     Do not smoke or use any products that contain nicotine or tobacco. If you need help quitting, ask your doctor. Do not drink alcohol. Eat a healthy diet. This includes a lot of vegetables, fruits, whole grains, low-fat dairy products, and low-fat (lean) protein. General instructions  Rest a lot. Sleep for at least 8 hours each night. Sleep with your head and neck raised. Put a few pillows under your head or sleep in a reclining chair. Return to your normal activities as told by your doctor. Ask your doctor what activities are safe for you. Drink enough fluid to keep your pee (urine) pale yellow. If your throat is sore, gargle with a mixture of salt and water 3-4 times a day or as needed. To make salt water, completely dissolve -1 tsp (3-6 g) of salt in 1 cup (237 mL) of warm water. Keep all follow-up visits. How is this prevented? Getting the pneumonia shot (vaccine). These shots have different types and schedules. Ask your doctor what works best for you. Think about getting this shot if: You are older than 68 years of age. You are 45-26 years of age and: You are being treated for cancer. You have long-term lung disease. You have other problems that affect your body's defense system. Ask your doctor if you have one of these. Getting your flu shot every year. Ask your doctor which type of shot is best for you. Going to the dentist as often as told. Washing your hands often with soap and water for at least 20 seconds. If you cannot use soap and water, use hand sanitizer. Contact a doctor if: You have a fever. You lose sleep because your cough medicine does not help. Get help right away if: You are short of breath and this gets worse. You have more chest pain. Your sickness gets worse. This is very serious if: You are an older adult. Your body's defense system is weak. You cough up blood. These symptoms may be an emergency. Get  help right away. Call 911. Do not wait to see if the symptoms will go away. Do not drive yourself to the hospital. Summary Pneumonia is an  infection of the lungs. Community-acquired pneumonia affects people who have not been in the hospital. Certain germs can cause this infection. This condition may be treated with medicines that kill germs. For very bad pneumonia, you may need a hospital stay and treatment to help with breathing. This information is not intended to replace advice given to you by your health care provider. Make sure you discuss any questions you have with your health care provider. Document Revised: 08/31/2021 Document Reviewed: 08/31/2021 Elsevier Patient Education  2024 Elsevier Inc.    Emil Schaumann, MD [facility]    [1]  Allergies Allergen Reactions   Symbicort  [Budesonide -Formoterol  Fumarate] Other (See Comments)    Patient reported dizziness, nausea, headaches and sore throat while using Symbicort  160. Reported on 09/03/17   Celebrex  [Celecoxib ]     Bleeding.     Chocolate Flavoring Agent (Non-Screening)    Ciprofloxacin Nausea And Vomiting    Headache, shaking.    Egg Protein-Containing Drug Products    Flavoring Agent (Non-Screening)     Unknown   Lisinopril  Cough    Pt off med for a week, some improvement   Lyrica [Pregabalin]     Made me out of it.    Penicillins     Patient preference   Wellbutrin [Bupropion]    Codeine Palpitations

## 2024-07-01 NOTE — Assessment & Plan Note (Signed)
 Clinically stable.  Much improved. Clear lungs on auscultation today.  Afebrile. Blood work results from last week reviewed with patient It showed electrolyte abnormalities and increased platelets most likely secondary to acute illness.  Recommend repeated today Will also repeat chest x-ray today Continues on doxycycline  100 mg twice a day Smoking less. Advised to stay well-hydrated. Was able to follow-up with pulmonary doctor and has video follow-up appointment in 2 weeks At some point will need CT of chest for cancer screening as well Needs to also follow-up with her PCP.

## 2024-07-01 NOTE — Patient Instructions (Signed)

## 2024-07-18 ENCOUNTER — Telehealth: Admitting: Primary Care

## 2024-07-18 DIAGNOSIS — R918 Other nonspecific abnormal finding of lung field: Secondary | ICD-10-CM

## 2024-07-18 DIAGNOSIS — J4489 Other specified chronic obstructive pulmonary disease: Secondary | ICD-10-CM | POA: Diagnosis not present

## 2024-07-18 DIAGNOSIS — F17210 Nicotine dependence, cigarettes, uncomplicated: Secondary | ICD-10-CM

## 2024-07-18 DIAGNOSIS — J181 Lobar pneumonia, unspecified organism: Secondary | ICD-10-CM | POA: Diagnosis not present

## 2024-07-18 MED ORDER — PREDNISONE 10 MG PO TABS: ORAL_TABLET | ORAL | 0 refills | Status: DC

## 2024-07-18 NOTE — Progress Notes (Deleted)
 "  @Patient  ID: Frances Nelson, female    DOB: 16-Jul-1956, 69 y.o.   MRN: [id number]  No chief complaint on file.   Referring provider: Norleen Lynwood ORN, MD  HPI: 69 year old female, current everyday smoker.   Previous [facility] pulmonary encounter: ROV 06/27/2024 --follow-up visit 69 year old woman who has a history of active tobacco use and associated COPD, chronic cough due to this as well as rhinitis, GERD.  She has a positive p-ANCA and ANA without any known pulmonary manifestations. She is being managed on She experienced an episode of acute dyspnea and chest pain last Wednesday, EMS came but she did not want to go to hospital. The pain persisted through the day and then subsided. Associated w some cough later in the day. She saw Dr Purcell 12/10, was just started on azithromycin . She is having L chest and back pain. Coughing up less mucous, but did see blood last pm. She has stopped the Breyna  - too expensive ($141), usingalbuterol  about 3-4 x day during this illness. Smoking about 1 pk/day.   CXR 12/10 reviewed by me, shows an evolving LUL infiltrate.   Lung cancer screening CT chest done 01/24/2024 reviewed by me showed RADS 2 study with no effusion or pneumothorax.  She does have centrilobular emphysema and diffuse bronchial wall thickening, a few scattered tiny pulmonary nodules largest 3 mm in the peripheral right middle lobe    07/18/2024-Interim  Discussed the use of AI scribe software for clinical note transcription with the patient, who gave verbal consent to proceed.  History of Present Illness   3-4 week follow-up During last visit Dr. Shelah recommended stopping azithromycin  Starting doxycycline  100mg  BID x 10 days along with promethazine -DM If still having problems needs CT chest, if improved needs fu CXR in 6 weeks   Allergies[1]  Immunization History  Administered Date(s) Administered   PFIZER(Purple Top)SARS-COV-2 Vaccination 11/06/2019, 12/01/2019   PNEUMOCOCCAL  CONJUGATE-20 04/09/2023   Tdap 03/15/2018    Past Medical History:  Diagnosis Date   Allergy 01.01.1975   Ambulates with cane    straight cane   Arthritis    Asthma    Bronchitis, obstructive, chronic (HCC)    hx   Cataracts, bilateral    pt has not had surgery to remove as of 02/15/24   Complication of anesthesia    patient states slow to wake up after anesthesia   COPD (chronic obstructive pulmonary disease) (HCC)    Dyspnea    only with exertion   Fibromyalgia    GERD (gastroesophageal reflux disease)    Headache    HLD (hyperlipidemia)    Hypertension    Pneumonia    x several    Tobacco History: Tobacco Use History[2] Ready to quit: Not Answered Counseling given: Not Answered Tobacco comments: 1 pack a day 01/23/24-Quit several times totaling 10 yrs but still currently smoking 1 ppd   Outpatient Medications Prior to Visit  Medication Sig Dispense Refill   acetaminophen  (TYLENOL ) 650 MG CR tablet Take 1,300 mg by mouth every 8 (eight) hours.     acidophilus (RISAQUAD) CAPS capsule Take 1 capsule by mouth in the morning.     albuterol  (PROVENTIL ) (2.5 MG/3ML) 0.083% nebulizer solution Take 3 mLs (2.5 mg total) by nebulization every 6 (six) hours as needed for wheezing or shortness of breath. 150 mL 5   albuterol  (VENTOLIN  HFA) 108 (90 Base) MCG/ACT inhaler Inhale 2 puffs into the lungs every 6 (six) hours as needed for wheezing or  shortness of breath. 8 g 6   alendronate  (FOSAMAX ) 70 MG tablet Take 1 tablet (70 mg total) by mouth every 7 (seven) days. Take with a full glass of water on an empty stomach. 12 tablet 3   Calcium -Vitamins C & D (CALCIUM /C/D PO) Take 1 tablet by mouth in the morning.     cetirizine (ZYRTEC) 10 MG tablet Take 10 mg by mouth in the morning.     Cholecalciferol (HM VITAMIN D3 PO) Take 5,000 Units by mouth in the morning.     cyclobenzaprine  (FLEXERIL ) 10 MG tablet Take 1 tablet (10 mg total) by mouth every 8 (eight) hours. 30 tablet 0    doxycycline  (VIBRA -TABS) 100 MG tablet Take 1 tablet (100 mg total) by mouth 2 (two) times daily. 20 tablet 0   DULoxetine  (CYMBALTA ) 30 MG capsule TAKE 1 CAPSULE IN THE MORNING AND 2 CAPSULES IN THE EVENING 270 capsule 4   ezetimibe  (ZETIA ) 10 MG tablet TAKE 1 TABLET BY MOUTH EVERY DAY 90 tablet 3   gabapentin  (NEURONTIN ) 100 MG capsule TAKE 1 CAPSULE (100 MG TOTAL) BY MOUTH THREE TIMES DAILY. 90 capsule 5   lidocaine  (LMX) 4 % cream Apply 1 Application topically as needed.     losartan  (COZAAR ) 25 MG tablet TAKE 1 TABLET (25 MG TOTAL) BY MOUTH DAILY. 90 tablet 3   MELATONIN PO Take 5 mg by mouth at bedtime.     meloxicam  (MOBIC ) 15 MG tablet Take 1 tablet (15 mg total) by mouth daily. 30 tablet 0   methocarbamol  (ROBAXIN ) 750 MG tablet Take 1 tablet (750 mg total) by mouth every 8 (eight) hours as needed for muscle spasms. 60 tablet 2   mometasone  (NASONEX ) 50 MCG/ACT nasal spray Place 2 sprays into the nose every evening.     montelukast  (SINGULAIR ) 10 MG tablet TAKE 1 TABLET BY MOUTH EVERYDAY AT BEDTIME 90 tablet 3   Multiple Vitamins-Minerals (MULTIVITAMIN WITH MINERALS) tablet Take 1 tablet by mouth in the morning.     omeprazole  (PRILOSEC) 40 MG capsule TAKE 1 CAPSULE (40 MG TOTAL) BY MOUTH IN THE MORNING AND AT BEDTIME. 180 capsule 3   phentermine  37.5 MG capsule Take 1 capsule (37.5 mg total) by mouth every morning. 90 capsule 1   promethazine -dextromethorphan (PROMETHAZINE -DM) 6.25-15 MG/5ML syrup Take 5 mLs by mouth 4 (four) times daily as needed for cough. 240 mL 0   traMADol  (ULTRAM ) 50 MG tablet Take 1 tablet (50 mg total) by mouth every 6 (six) hours as needed. 60 tablet 1   No facility-administered medications prior to visit.      Review of Systems  Review of Systems   Physical Exam  There were no vitals taken for this visit. Physical Exam  ***  Lab Results:  CBC    Component Value Date/Time   WBC 6.2 07/01/2024 1143   RBC 4.52 07/01/2024 1143   HGB 13.6  07/01/2024 1143   HCT 41.1 07/01/2024 1143   PLT 698.0 (H) 07/01/2024 1143   MCV 91.0 07/01/2024 1143   MCH 30.9 02/15/2024 1040   MCHC 33.2 07/01/2024 1143   RDW 14.1 07/01/2024 1143   LYMPHSABS 1.8 07/01/2024 1143   MONOABS 0.4 07/01/2024 1143   EOSABS 0.1 07/01/2024 1143   BASOSABS 0.0 07/01/2024 1143    BMET    Component Value Date/Time   NA 136 07/01/2024 1143   K 3.6 07/01/2024 1143   CL 97 07/01/2024 1143   CO2 31 07/01/2024 1143   GLUCOSE 72 07/01/2024  1143   BUN 12 07/01/2024 1143   CREATININE 0.59 07/01/2024 1143   CREATININE 0.69 04/05/2023 1443   CALCIUM  9.5 07/01/2024 1143   GFRNONAA >60 02/15/2024 1040   GFRNONAA >60 07/04/2018 1119   GFRAA >60 07/04/2018 1119    BNP No results found for: BNP  ProBNP    Component Value Date/Time   PROBNP 20.0 10/16/2019 1116    Imaging: DG Chest 2 View Result Date: 07/01/2024 CLINICAL DATA:  Cough, pneumonia EXAM: CHEST - 2 VIEW COMPARISON:  Six days ago FINDINGS: Mildly decreased left perihilar opacity is noted suggesting improving pneumonia. Continued radiographic follow-up is recommended to ensure complete resolution. IMPRESSION: Mildly decreased left perihilar opacity is noted suggesting improving pneumonia. Followup PA and lateral chest X-ray is recommended in 3-4 weeks following trial of antibiotic therapy to ensure resolution and exclude underlying malignancy. Electronically Signed   By: Lynwood Landy Raddle M.D.   On: 07/01/2024 11:52   MR Lumbar Spine w/o contrast Result Date: 06/27/2024 CLINICAL DATA:  Lumbar radiculopathy EXAM: MRI LUMBAR SPINE WITHOUT CONTRAST TECHNIQUE: Multiplanar, multisequence MR imaging of the lumbar spine was performed. No intravenous contrast was administered. COMPARISON:  MRI 10/25/2021 FINDINGS: Segmentation:  Standard. Alignment:  Physiologic. Vertebrae: No fracture, evidence of discitis, or bone lesion. Interval posterior and interbody fusion at L4-L5 with posterior decompression. Conus  medullaris and cauda equina: Conus extends to the T12-L1 level. Conus and cauda equina appear normal. Paraspinal and other soft tissues: Postsurgical changes posterior to the L4-L5 level. No postoperative fluid collection. Imaged retroperitoneum demonstrates no acute findings. Disc levels: T12-L1: No significant disc protrusion, foraminal stenosis, or canal stenosis. L1-L2: No significant disc protrusion, foraminal stenosis, or canal stenosis. L2-L3: Minimal annular disc bulge and mild facet hypertrophy. No canal stenosis. Mild bilateral foraminal stenosis, mildly progressed. L3-L4: Annular disc bulge with bilateral facet arthropathy and ligamentum flavum buckling. Mild-moderate canal stenosis. Severe left and moderate right foraminal stenosis. Findings have progressed from prior. L4-L5: Interval fusion and posterior decompression. Thecal sac appears to be widely patent without canal stenosis. No right foraminal stenosis. Left foramen is obscured by susceptibility artifact, limiting its assessment. L5-S1: No disc protrusion. Bilateral facet hypertrophy. No significant foraminal or canal stenosis. IMPRESSION: 1. Interval posterior and interbody fusion at L4-L5 with posterior decompression. No residual canal or right foraminal stenosis. Left foramen is obscured by susceptibility artifact, limiting its assessment. 2. 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 3. Mild bilateral foraminal stenosis at L2-L3, mildly progressed. Electronically Signed   By: Mabel Converse D.O.   On: 06/27/2024 10:29   DG Chest 2 View Result Date: 06/25/2024 CLINICAL DATA:  Chest pain.  Hemoptysis and low-grade fever. EXAM: CHEST - 2 VIEW COMPARISON:  Chest radiograph dated 10/22/2020. FINDINGS: Patchy area of airspace opacity in the left upper lobe most consistent with developing infiltrate. Clinical correlation and follow-up to resolution recommended. The  right lung is clear. No pleural effusion pneumothorax. The cardiac silhouette is within normal limits. Degenerative changes of spine. No acute osseous pathology. IMPRESSION: Left upper lobe infiltrate. Clinical correlation and follow-up to resolution recommended. Electronically Signed   By: Vanetta Chou M.D.   On: 06/25/2024 15:09     Assessment & Plan:   No problem-specific Assessment & Plan notes found for this encounter.   1. COPD with asthma (HCC) (Primary)   Assessment and Plan Assessment & Plan       I personally spent a total of *** minutes  in the care of the patient today including {Time Based Coding:210964241}.   Almarie LELON Ferrari, NP 07/18/2024     [1]  Allergies Allergen Reactions   Symbicort  [Budesonide -Formoterol  Fumarate] Other (See Comments)    Patient reported dizziness, nausea, headaches and sore throat while using Symbicort  160. Reported on 09/03/17   Celebrex  [Celecoxib ]     Bleeding.     Chocolate Flavoring Agent (Non-Screening)    Ciprofloxacin Nausea And Vomiting    Headache, shaking.    Egg Protein-Containing Drug Products    Flavoring Agent (Non-Screening)     Unknown   Lisinopril  Cough    Pt off med for a week, some improvement   Lyrica [Pregabalin]     Made me out of it.    Penicillins     Patient preference   Wellbutrin [Bupropion]    Codeine Palpitations  [2]  Social History Tobacco Use  Smoking Status Every Day   Current packs/day: 1.00   Average packs/day: 1 pack/day for 41.0 years (41.0 ttl pk-yrs)   Types: Cigarettes   Start date: 71   Last attempt to quit: 2000   Passive exposure: Never  Smokeless Tobacco Never  Tobacco Comments   1 pack a day   01/23/24-Quit several times totaling 10 yrs but still currently smoking 1 ppd   "

## 2024-07-18 NOTE — Progress Notes (Signed)
 Virtual Visit via Video Note  I connected with Frances Nelson on 07/18/2024 at  1:00 PM EST by a video enabled telemedicine application and verified that I am speaking with the correct person using two identifiers.  Location: Patient: Home Provider: Office    I discussed the limitations of evaluation and management by telemedicine and the availability of in person appointments. The patient expressed understanding and agreed to proceed.  History of Present Illness:  69 year old female, current every day smoker. PMH significant for HTN, allergic rhinitis, COPD with asthma, GERD, HLD, obesity, tobacco abuse.   07/18/2024 Discussed the use of AI scribe software for clinical note transcription with the patient, who gave verbal consent to proceed.  History of Present Illness Frances Nelson is a 69 year old female with COPD and asthma who presents for follow-up of pneumonia.  In December, she was diagnosed with left upper lobe pneumonia, confirmed by a chest x-ray. Initially treated with azithromycin , her treatment was switched to doxycycline  for ten days. She experienced improvement in her symptoms but continues to have a persistent cough and new onset of pain in her upper back, similar to previous episodes. No fever is present, and she is coughing up a small amount of sputum.  A chest x-ray from December showed mildly decreased left perihilar opacity. The initial left upper lobe infiltrate was noted on December 10th, with a follow-up x-ray six days later showing mild improvement. She completed her doxycycline  course around December 22nd.  The severe pain in her left breast has resolved, but she now experiences a 'tightness type of pain' across the middle of her back. She is an active smoker, which is relevant given her history of COPD and asthma. She has used promethazine  DM for her cough but found it ineffective.  Regarding her medication history, she completed a course of doxycycline  and has  used promethazine  DM without significant relief. She is currently on alendronate  for osteoporosis. She reports adverse effects from prednisone , including bone pain, particularly in the hips, and has a history of poor tolerance to Symbicort .  In the review of symptoms, she denies hemoptysis and fever.  Observations/Objective:  Appears well, occasional cough. Able to speak in full sentences without overt shortness of breath  Assessment and Plan:  1. COPD with asthma (HCC) (Primary)  2. Left upper lobe pulmonary infiltrate - DG Chest 2 View; Future  Assessment & Plan Left upper lobe pneumonia Improvement on chest x-ray 12/16 with mildly decreased left perihilar opacity. Completed 10 day course of Doxycycline . Persistent pleuritic pain and cough. No fever or hemoptysis. Symptoms improving but not fully resolved. Pneumonia resolution can take 4-6 weeks. Prednisone  considered for pleuritic pain, with discussion of potential side effects including bone pain and osteoporosis risk. Low dose prednisone  chosen for short-term relief. - Prescribed prednisone  20 mg daily for 5 days. - Ordered chest x-ray for January 16th, 2026. - Monitor for fevers, hemoptysis, worsening cough, or colored mucus.  Chronic obstructive pulmonary disease with asthma COPD and asthma with current smoking status. She was diagnosed with CAP in early December, cough improving. Associated chest tightness. Not currently on maintenance BD. Past tolerance issues with OCS and ICS.  Discussion of potential side effects of oral steroids, including systemic effects and osteoporosis risk. - Prescribed prednisone  20 mg daily for 5 days.   Follow Up Instructions:   CXR on 08/02/23, in opacity hasn't resolved we will get CT chest   I discussed the assessment and treatment plan with  the patient. The patient was provided an opportunity to ask questions and all were answered. The patient agreed with the plan and demonstrated an  understanding of the instructions.   The patient was advised to call back or seek an in-person evaluation if the symptoms worsen or if the condition fails to improve as anticipated.  I provided 25 minutes of non-face-to-face time during this encounter.   Almarie LELON Ferrari, NP

## 2024-07-22 ENCOUNTER — Encounter (HOSPITAL_COMMUNITY): Payer: Self-pay | Admitting: Orthopedic Surgery

## 2024-07-24 ENCOUNTER — Ambulatory Visit: Admitting: Podiatry

## 2024-07-24 DIAGNOSIS — L603 Nail dystrophy: Secondary | ICD-10-CM

## 2024-07-24 DIAGNOSIS — I872 Venous insufficiency (chronic) (peripheral): Secondary | ICD-10-CM | POA: Diagnosis not present

## 2024-07-24 NOTE — Progress Notes (Unsigned)
 Subjective:   Patient ID: Frances Nelson, female   DOB: 69 y.o.   MRN: [id number]   HPI Chief Complaint  Patient presents with   Nail Problem    Patient presents today for a f/u on non fungal nail damage , patient relates compound medication has helps but not as fast as she expected it too    69 year old female presents the office today with the above concerns.  She states that she been using a compound cream.  States that she has been using a topical medication through [employer].  Although it is helping somewhat does not happen as fast as she thought.  Nails do get tender as they get elongated and thick.  No swelling, redness or any drainage.  Today she also had some secondary concerns of ongoing swelling to the left ankle since an injury that she wants to ask about.  No significant pain associated with this has been having ongoing swelling.   Review of Systems  All other systems reviewed and are negative.    Objective:  Physical Exam  General: AAO x3, NAD  Dermatological: The nails are all hypertrophic, dystrophic with yellow, brown discoloration and subungual debris is present.  Minimal clearing noted.  There is no edema, erythema or any signs of infection otherwise.  No open lesions.  Vascular: Dorsalis Pedis artery and Posterior Tibial artery pedal pulses are 2/4 bilateral with immedate capillary fill time. There is no pain with calf compression, swelling, warmth, erythema.   Neruologic:  Sensation decreased with some nausea monofilament.  Musculoskeletal: There is chronic appearing edema present to left ankle.  There is no pain with calf compression, erythema or warmth Appears to be supple.  It looks to me almost like a venous insufficiency type skin given the ongoing swelling.  No erythema or warmth or any open lesions or any significant pain today.      Assessment:   Onychodystrophy, neuropathy, Achilles tendinitis     Plan:  -Treatment options discussed  including all alternatives, risks, and complications -Etiology of symptoms were discussed  Onychodystrophy - As a courtesy I debrided the nails without any complications or bleeding.  Discussed the continue topical medication but also adding urea nail gel 40%.  She does get some reaction to tea tree oil so discussed the urea to nail gel if she has any issues with the topical medication.  Symptomatic neuropathy -She has ongoing chronic pain in bilateral lower extremities.  Nerve conduction test previously ordered.  This does confirm polyneuropathy.  She has seen other providers for this.  Given ongoing pain recommended referral to pain management for alternative treatment options.  Left ankle edema -Could be posttraumatic versus venous insufficiency.  Ordered a venous reflux study.  Return in about 3 months (around 10/22/2024), or if symptoms worsen or fail to improve.  Frances Nelson DPM

## 2024-07-28 ENCOUNTER — Ambulatory Visit: Admitting: Physical Medicine and Rehabilitation

## 2024-07-28 ENCOUNTER — Ambulatory Visit: Payer: Self-pay

## 2024-07-28 ENCOUNTER — Encounter: Attending: Physical Medicine & Rehabilitation | Admitting: Physical Medicine & Rehabilitation

## 2024-07-28 ENCOUNTER — Encounter: Payer: Self-pay | Admitting: Physical Medicine & Rehabilitation

## 2024-07-28 VITALS — BP 130/84 | HR 92

## 2024-07-28 VITALS — BP 130/84 | HR 95 | Ht 64.0 in | Wt 214.6 lb

## 2024-07-28 DIAGNOSIS — G8929 Other chronic pain: Secondary | ICD-10-CM | POA: Insufficient documentation

## 2024-07-28 DIAGNOSIS — M5441 Lumbago with sciatica, right side: Secondary | ICD-10-CM | POA: Diagnosis not present

## 2024-07-28 DIAGNOSIS — M797 Fibromyalgia: Secondary | ICD-10-CM | POA: Diagnosis not present

## 2024-07-28 DIAGNOSIS — M961 Postlaminectomy syndrome, not elsewhere classified: Secondary | ICD-10-CM | POA: Diagnosis not present

## 2024-07-28 DIAGNOSIS — M25512 Pain in left shoulder: Secondary | ICD-10-CM | POA: Insufficient documentation

## 2024-07-28 DIAGNOSIS — M5442 Lumbago with sciatica, left side: Secondary | ICD-10-CM | POA: Insufficient documentation

## 2024-07-28 DIAGNOSIS — G629 Polyneuropathy, unspecified: Secondary | ICD-10-CM | POA: Diagnosis not present

## 2024-07-28 DIAGNOSIS — M5416 Radiculopathy, lumbar region: Secondary | ICD-10-CM | POA: Diagnosis not present

## 2024-07-28 MED ORDER — METHYLPREDNISOLONE ACETATE 40 MG/ML IJ SUSP
40.0000 mg | Freq: Once | INTRAMUSCULAR | Status: AC
  Administered 2024-07-28: 40 mg

## 2024-07-28 NOTE — Progress Notes (Signed)
 Pain Scale   Average Pain 9 Patient advising she has chronic lower back pain that radiated bilaterally to legs and pain is constant        +Driver, -BT, -Dye Allergies.

## 2024-07-28 NOTE — Progress Notes (Signed)
 "  Subjective:    Patient ID: Frances Nelson, female    DOB: [birth year], 69 y.o.   MRN: [id number]  HPI  Discussed the use of AI scribe software for clinical note transcription with the patient, who gave verbal consent to proceed.    Frances Nelson is a 69 y.o. year old female  who  has a past medical history of Allergy (01.01.1975), Ambulates with cane, Arthritis, Asthma, Bronchitis, obstructive, chronic (HCC), Cataracts, bilateral, Complication of anesthesia, COPD (chronic obstructive pulmonary disease) (HCC), Dyspnea, Fibromyalgia, GERD (gastroesophageal reflux disease), Headache, HLD (hyperlipidemia), Hypertension, and Pneumonia.   They are presenting to PM&R clinic as a new patient for pain management evaluation. They were referred by Dr. Gershon for treatment of neuropathic pain.  The patient presents for evaluation of neuropathic pain in her feet and ankles. She reports numbness extending from her toes up to her ankles bilaterally, which is more pronounced on one side. She describes a burning and stabbing pain, likening it to a knife going through her toe and into her foot. These symptoms have been present since approximately 2016. She notes that today her feet are not swollen, which is unusual, and she is unsure if this is related to the cold weather or a back injection she received earlier today.  She reports no feeling in her feet or toes when tested with pins by other physicians, but she does experience pain. She says she has seen multiple doctors, reports definitive cause not yet identified.  She has a history of seeing Dr. Leigh (neurology). She also had a nerve conduction study performed by Dr. Carilyn in the past.   She reports history L4-L5 fusion, with subsequent development of L3-L4 stenosis. She also has a history of left shoulder surgery, meniscus repair, carpal tunnel syndrome, Achilles tendonitis, fibromyalgia, and bone spurs in both heels with dropped metatarsal bones. She  reports chronic swelling in her ankles of unknown etiology. She reports sensitive skin.   Current medications include duloxetine  60 mg daily and gabapentin  100 mg twice daily, which she feels provides minimal relief. She takes Tylenol  Arthritis 650 mg twice daily, which helps for about 4-5 hours. She uses tramadol  sparingly for severe pain, which helps with overall body pain but not specifically with her foot pain. She has a history of an adverse reaction to Lyrica (headaches, excessive sedation) when started on a high dose. She has also tried nortriptyline  in the past. She cannot take ibuprofen  due to stomach cramps and rash.  She has tried multiple topical agents including Biofreeze, Sombra, Theraworks, Blue Emu, lidocaine  cream, and Voltaren  gel with minimal to no relief for pain, though Biofreeze helps slightly with muscle spasms. She uses a TENS unit which provides some benefit. She has also undergone physical therapy for her shoulder and aquatic therapy for her knee, which she found helpful.  She reports difficulty with mobility, including an inability to get up from the floor or out of a bathtub without assistance. She uses a cane for balance and reports frequent falls without it. She describes her foot pain as constant (24/7) and severe, feeling like walking on glass or having the skin ripped off the bottom of her feet, even on carpet.  She reports poor sleep, typically getting 4-5 hours per night. She denies any personal or family history of substance abuse or preadolescent sexual abuse. She denies any formal diagnosis of depression but acknowledges feeling down due to chronic pain and functional limitations.  Medications tried: Topical medications lidocaine ,  voltaren  gel, biofreeze , minimal benefit Nsaids Mobic - tired in the past, hard to say how much it helped, ibuprofen - causes stomach cramps and rash Tylenol   Tylenol - helps a little  Opiates Tramadol - helps a a little  Gabapentin  -  Currently taking Lyrica- Headache, thinks it might have been high dose, would be willing to try again at lower dose TCAs  -Nortiptyline- did not help  SNRIs  Cymbalta - currently taking  Other treatments: PT for left shoulder last year , aquatic therapy helped TENs unit - helps a little  Injections ESI 07/29/23 Surgery L spine Fusion    Prior UDS results: No results found for: LABOPIA, COCAINSCRNUR, LABBENZ, AMPHETMU, THCU, LABBARB      Pain Inventory Average Pain 4 Pain Right Now 5 My pain is dull, stabbing, and tingling  In the last 24 hours, has pain interfered with the following? General activity 5 Relation with others 3 Enjoyment of life 4 What TIME of day is your pain at its worst? morning  and night Sleep (in general) Fair  Pain is worse with: walking, sitting, inactivity, and standing Pain improves with: TENS Relief from Meds: 3  use a cane how many minutes can you walk? 10 do you drive?  yes  retired I need assistance with the following:  meal prep, household duties, and shopping  bladder control problems weakness numbness tingling trouble walking spasms dizziness  Any changes since last visit?  no  Any changes since last visit?  no    Family History  Problem Relation Age of Onset   Arthritis Mother    COPD Mother    Depression Mother    Hearing loss Mother    Angina Father    Parkinson's disease Father    Hearing loss Father    Heart disease Father    Leukemia Sister    Kidney disease Sister    Kidney disease Brother    Brain cancer Brother        glioblastoma    Cancer Brother    Allergies Son    Asthma Son    Allergies Son    Drug abuse Son    Early death Son    Asthma Son    Cancer Sister    Cancer Brother    Asthma Son    Cancer Brother    Cancer Sister    Social History   Socioeconomic History   Marital status: Single    Spouse name: Not on file   Number of children: 2   Years of education: Not on file    Highest education level: Some college, no degree  Occupational History   Occupation: RETIRED  Tobacco Use   Smoking status: Every Day    Current packs/day: 1.00    Average packs/day: 1 pack/day for 41.0 years (41.0 ttl pk-yrs)    Types: Cigarettes    Start date: 38    Last attempt to quit: 2000    Passive exposure: Never   Smokeless tobacco: Never   Tobacco comments:    1 pack a day    01/23/24-Quit several times totaling 10 yrs but still currently smoking 1 ppd  Vaping Use   Vaping status: Never Used  Substance and Sexual Activity   Alcohol use: No   Drug use: Yes    Types: Amphetamines    Comment: on phentermine  for weight loss   Sexual activity: Not Currently    Birth control/protection: Surgical    Comment: Hysterectomy  Other Topics Concern  Not on file  Social History Narrative   Right Handed    Lives in a two story home - Lives with son       Are you currently employed ?    What is your current occupation?retired   Do you live at home alone?   Who lives with you?    What type of home do you live in: 1 story or 2 story?     Caffeine 1-2 cups a day   Social Drivers of Health   Tobacco Use: High Risk (07/28/2024)   Patient History    Smoking Tobacco Use: Every Day    Smokeless Tobacco Use: Never    Passive Exposure: Never  Financial Resource Strain: Medium Risk (04/26/2024)   Overall Financial Resource Strain (CARDIA)    Difficulty of Paying Living Expenses: Somewhat hard  Food Insecurity: No Food Insecurity (04/26/2024)   Epic    Worried About Programme Researcher, Broadcasting/film/video in the Last Year: Never true    Ran Out of Food in the Last Year: Never true  Transportation Needs: No Transportation Needs (04/26/2024)   Epic    Lack of Transportation (Medical): No    Lack of Transportation (Non-Medical): No  Physical Activity: Insufficiently Active (04/26/2024)   Exercise Vital Sign    Days of Exercise per Week: 2 days    Minutes of Exercise per Session: 60 min  Stress:  No Stress Concern Present (04/26/2024)   [employer] of Occupational Health - Occupational Stress Questionnaire    Feeling of Stress: Not at all  Social Connections: Socially Isolated (04/26/2024)   Social Connection and Isolation Panel    Frequency of Communication with Friends and Family: More than three times a week    Frequency of Social Gatherings with Friends and Family: Once a week    Attends Religious Services: Patient declined    Database Administrator or Organizations: No    Attends Banker Meetings: Not on file    Marital Status: Widowed  Depression (PHQ2-9): Medium Risk (07/28/2024)   Depression (PHQ2-9)    PHQ-2 Score: 8  Alcohol Screen: Low Risk (10/29/2023)   Alcohol Screen    Last Alcohol Screening Score (AUDIT): 0  Housing: Low Risk (04/26/2024)   Epic    Unable to Pay for Housing in the Last Year: No    Number of Times Moved in the Last Year: 0    Homeless in the Last Year: No  Utilities: Not At Risk (10/29/2023)   AHC Utilities    Threatened with loss of utilities: No  Health Literacy: Adequate Health Literacy (10/29/2023)   B1300 Health Literacy    Frequency of need for help with medical instructions: Never   Past Surgical History:  Procedure Laterality Date   ABDOMINAL HYSTERECTOMY  8.19.1990   APPENDECTOMY  9.1.1980   BICEPT TENODESIS Left 02/21/2024   Procedure: TENODESIS, BICEPS;  Surgeon: Addie Cordella Hamilton, MD;  Location[facility] OR;  Service: Orthopedics;  Laterality: Left;   CYST EXCISION     upper right side lateral - benign   HERNIA REPAIR     Umbilical x 3   KNEE ARTHROSCOPY W/ MENISCAL REPAIR Left 12/2022   SHOULDER OPEN ROTATOR CUFF REPAIR Left 02/21/2024   Procedure: REPAIR, ROTATOR CUFF, OPEN;  Surgeon: Addie Cordella Hamilton, MD;  Location: [facility] OR;  Service: Orthopedics;  Laterality: Left;   SPINE SURGERY  05/2021   TONSILLECTOMY     TRANSFORAMINAL LUMBAR INTERBODY FUSION (TLIF) WITH PEDICLE SCREW FIXATION  1 LEVEL N/A 05/18/2022    Procedure: Lumbar Four-Five Open Laminectomy/Transforaminal Lumbar Interbody Fusion/Posterolateral fusion;  Surgeon: Cheryle Debby LABOR, MD;  Location: [facility] OR;  Service: Neurosurgery;  Laterality: N/A;   Past Medical History:  Diagnosis Date   Allergy 01.01.1975   Ambulates with cane    straight cane   Arthritis    Asthma    Bronchitis, obstructive, chronic (HCC)    hx   Cataracts, bilateral    pt has not had surgery to remove as of 02/15/24   Complication of anesthesia    patient states slow to wake up after anesthesia   COPD (chronic obstructive pulmonary disease) (HCC)    Dyspnea    only with exertion   Fibromyalgia    GERD (gastroesophageal reflux disease)    Headache    HLD (hyperlipidemia)    Hypertension    Pneumonia    x several   BP 130/84 Comment: last taken today prev appt  Pulse 95   Ht 5' 4 (1.626 m)   Wt 214 lb 9.6 oz (97.3 kg)   SpO2 98%   BMI 36.84 kg/m   Opioid Risk Score:   Fall Risk Score:  `1  Depression screen PHQ 2/9     07/28/2024    1:48 PM 04/29/2024    1:16 PM 04/03/2024   11:23 AM 10/29/2023   11:12 AM 10/11/2023   11:02 AM 05/28/2023    1:55 PM 04/09/2023   11:08 AM  Depression screen PHQ 2/9  Decreased Interest 2 0 1 1 0 0 0  Down, Depressed, Hopeless 1 0 1 0 0 0 0  PHQ - 2 Score 3 0 2 1 0 0 0  Altered sleeping 2 0 3 0     Tired, decreased energy 2 0 2 3     Change in appetite 1 0 1 0     Feeling bad or failure about yourself  0 0 0 0     Trouble concentrating 0 0 0 0     Moving slowly or fidgety/restless 0 0 1 0     Suicidal thoughts 0 0 0 0     PHQ-9 Score 8 0  9  4      Difficult doing work/chores Somewhat difficult Not difficult at all Somewhat difficult Somewhat difficult        Data saved with a previous flowsheet row definition    Review of Systems  Respiratory:  Positive for cough, shortness of breath and wheezing.   Cardiovascular:  Positive for leg swelling.  Gastrointestinal:  Positive for abdominal pain.   Musculoskeletal:  Positive for gait problem.  Neurological:  Positive for dizziness, weakness and numbness.       Tingling  Psychiatric/Behavioral:  Positive for dysphoric mood.   All other systems reviewed and are negative.      Objective:   Physical Exam  Gen: no distress, normal appearing HEENT: oral mucosa pink and moist, NCAT Chest: normal effort, normal rate of breathing Abd: soft, non-distended Ext: no edema Psych: pleasant, normal affect Skin: intact Neuro: Alert and awake, follows commands, cranial nerves II through XII grossly intact, normal speech and language RUE: 5/5 Deltoid, 5/5 Biceps, 5/5 Triceps, 5/5 Wrist Ext, 5/5 Grip LUE: 4/5 Deltoid, 5/5 Biceps, 5/5 Triceps, 5/5 Wrist Ext, 5/5 Grip RLE: HF 5/5, KE 5/5, ADF 5/5, APF 5/5 LLE: HF 4/5, KE 4/5, ADF 4/5, APF 4/5 Sensation to LT diminished in the feet up to the ankles bilaterally.  No abnormal tone  noted Musculoskeletal:   There was tenderness to palpation over the cervical spine and down the arms and legs, consistent with fibromyalgia tender points. Very tender over b/l lumbar spine.  Slump test negative bilaterally Antalgic gait, walks with cane   MRI L spine 06/24/24 Vertebrae: No fracture, evidence of discitis, or bone lesion. Interval posterior and interbody fusion at L4-L5 with posterior decompression.   Conus medullaris and cauda equina: Conus extends to the T12-L1 level. Conus and cauda equina appear normal.   Paraspinal and other soft tissues: Postsurgical changes posterior to the L4-L5 level. No postoperative fluid collection. Imaged retroperitoneum demonstrates no acute findings.   Disc levels:   T12-L1: No significant disc protrusion, foraminal stenosis, or canal stenosis.   L1-L2: No significant disc protrusion, foraminal stenosis, or canal stenosis.   L2-L3: Minimal annular disc bulge and mild facet hypertrophy. No canal stenosis. Mild bilateral foraminal stenosis,  mildly progressed.   L3-L4: Annular disc bulge with bilateral facet arthropathy and ligamentum flavum buckling. Mild-moderate canal stenosis. Severe left and moderate right foraminal stenosis. Findings have progressed from prior.   L4-L5: Interval fusion and posterior decompression. Thecal sac appears to be widely patent without canal stenosis. No right foraminal stenosis. Left foramen is obscured by susceptibility artifact, limiting its assessment.   L5-S1: No disc protrusion. Bilateral facet hypertrophy. No significant foraminal or canal stenosis.   IMPRESSION: 1. Interval posterior and interbody fusion at L4-L5 with posterior decompression. No residual canal or right foraminal stenosis. Left foramen is obscured by susceptibility artifact, limiting its assessment. 2. 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 3. Mild bilateral foraminal stenosis at L2-L3, mildly progressed.     C spine MRI 12/11/23 Within the limitations of motion artifact, no acute abnormality of the cervical spine.   Mild degenerative changes as above. No high-grade spinal canal stenosis. Mild foraminal stenosis on the left at C2-3 and C5-6.   12/10/24 L shoulder MRI   IMPRESSION: 1. Full-thickness tear of the mid and anterior supraspinatus tendon footprint in a region measuring up to 2.3 cm in AP dimension with up to 2.2 cm tendon retraction. Mild supraspinatus muscle atrophy. 2. Moderate thinning/fraying of the deep fibers of the subscapularis tendon insertion, greatest within the superior 50% of the subscapularis tendon insertion. 3. Mild-to-moderate degenerative changes of the acromioclavicular joint. Moderate distal lateral broad-based subacromial spurring. 4. Mild-to-moderate glenohumeral cartilage thinning.    EMG 09/05/22 Dr. Leigh:   & EMG Findings: Extensive electrodiagnostic evaluation of the left lower limb with  additional  nerve conduction  studies of the right lower limb shows: Bilateral sural and superficial peroneal/fibular sensory responses are absent. Left median sensory response shows prolonged distal peak latency (4.3 ms). Left ulnar sensory response is within normal limits. Left tibial (AH) motor response shows prolonged distal onset latency (6.4 ms) and reduced amplitude (1.61 mV). Left peroneal/fibular (EDB), left median (APB), and left ulnar (ADM) motor responses are within normal limits.  Left H reflex latency is within normal limits. Chronic motor axon loss changes WITH active denervation changes are seen in the left extensor digitorum brevis and abductor hallucis muscles. Chronic motor axon loss changes WITHOUT active denervation changes are seen in the left tibialis anterior, flexor digitorum longus, and gluteus medius muscles.   Impression: This is an abnormal study. 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 The residuals of  an old intraspinal canal lesion (ie: motor radiculopathy) at the left L5 root or segment, mild in degree electrically. Evidence of a left median mononeuropathy at or distal to the wrist, consistent with carpal tunnel syndrome, mild in degree electrically.      Assessment & Plan:   1.  Neuropathic pain in bilateral feet  -She had EMG/NCS with large fiber sensorimotor polyneuropathy. She was seen by neurology Dr. Leigh in the past. 2.  Fibromyalgia 3.  Chronic low back pain s/p L4-L5 fusion with L3-L4 stenosis  -ESI completed today 07/28/24 by Dr. Eldonna 4.  Left shoulder pain s/p surgical repair 5.  Chronic knee pain s/p meniscus repair  PLAN 1.  Will attempt to obtain insurance approval for Qutenza (capsaicin 8%) patches for neuropathic foot pain. -Discussed Qutenza as an option for neuropathic pain control. Discussed that this is a  capsaicin patch, stronger than capsaicin cream. Discussed that it is currently approved for diabetic peripheral neuropathy and post-herpetic neuralgia, but that it has also shown benefit in treating other forms of neuropathy. Provided patient with link to site to learn more about the patch: https://www.clark.biz/. Discussed that the patch would be placed in office and benefits usually last 3 months. Discussed that unintended exposure to capsaicin can cause severe irritation of eyes, mucous membranes, respiratory tract, and skin, but that Qutenza is a local treatment and does not have the systemic side effects of other nerve medications. Discussed that there may be pain, itching, erythema, and decreased sensory function associated with the application of Qutenza. Side effects usually subside within 1 week. A cold pack of analgesic medications can help with these side effects. Blood pressure can also be increased due to pain associated with administration of the patch.  We are recommending Qutenza 8% capsaicin to treat this patient's pain. Qutenza is the first-line treatment option recommended for diabetic peripheral neuropathy by the AACE and ADA.   2.  Will provide a referral for aquatic therapy at [facility] 3.  Will continue current medications as prescribed by other providers. 4.  Discussed continuation of Tramadol . Pt uses this sparingly. She would like to continue to f/u with her other providers for this medication for now. Can revisit at a later time.   5.  Follow-up in approximately 3-4 weeks to assess response to Qutenza if approved and to discuss further management options if needed.  "

## 2024-07-28 NOTE — Progress Notes (Signed)
 "  Frances Nelson - 69 y.o. female MRN [id number]  Date of birth: 1955-10-06  Office Visit Note: Visit Date: 07/28/2024 PCP: Norleen Lynwood ORN, MD Referred by: Norleen Lynwood ORN, MD  Subjective: Chief Complaint  Patient presents with   Lower Back - Pain   HPI:  Frances Nelson is a 69 y.o. female who comes in today at the request of Herlene Calix, PA-C for planned Bilateral L3-4 Lumbar Transforaminal epidural steroid injection with fluoroscopic guidance.  The patient has failed conservative care including home exercise, medications, time and activity modification.  This injection will be diagnostic and hopefully therapeutic.  Please see requesting physician notes for further details and justification. Constant pain down the sides of both legs to the feet. No changes with standing, walking or sitting.   ROS Otherwise per HPI.  Assessment & Plan: Visit Diagnoses:    ICD-10-CM   1. Lumbar radiculopathy  M54.16 XR C-ARM NO REPORT    Epidural Steroid injection    methylPREDNISolone  acetate (DEPO-MEDROL ) injection 40 mg    2. Post laminectomy syndrome  M96.1 XR C-ARM NO REPORT    Epidural Steroid injection    methylPREDNISolone  acetate (DEPO-MEDROL ) injection 40 mg      Plan: No additional findings.   Meds & Orders:  Meds ordered this encounter  Medications   methylPREDNISolone  acetate (DEPO-MEDROL ) injection 40 mg    Orders Placed This Encounter  Procedures   XR C-ARM NO REPORT   Epidural Steroid injection    Follow-up: Return for visit to requesting provider as needed.   Procedures: No procedures performed      Clinical History: MRI LUMBAR SPINE WITHOUT CONTRAST   TECHNIQUE: Multiplanar, multisequence MR imaging of the lumbar spine was performed. No intravenous contrast was administered.   COMPARISON:  MRI 10/25/2021   FINDINGS: Segmentation:  Standard.   Alignment:  Physiologic.   Vertebrae: No fracture, evidence of discitis, or bone lesion. Interval posterior and  interbody fusion at L4-L5 with posterior decompression.   Conus medullaris and cauda equina: Conus extends to the T12-L1 level. Conus and cauda equina appear normal.   Paraspinal and other soft tissues: Postsurgical changes posterior to the L4-L5 level. No postoperative fluid collection. Imaged retroperitoneum demonstrates no acute findings.   Disc levels:   T12-L1: No significant disc protrusion, foraminal stenosis, or canal stenosis.   L1-L2: No significant disc protrusion, foraminal stenosis, or canal stenosis.   L2-L3: Minimal annular disc bulge and mild facet hypertrophy. No canal stenosis. Mild bilateral foraminal stenosis, mildly progressed.   L3-L4: Annular disc bulge with bilateral facet arthropathy and ligamentum flavum buckling. Mild-moderate canal stenosis. Severe left and moderate right foraminal stenosis. Findings have progressed from prior.   L4-L5: Interval fusion and posterior decompression. Thecal sac appears to be widely patent without canal stenosis. No right foraminal stenosis. Left foramen is obscured by susceptibility artifact, limiting its assessment.   L5-S1: No disc protrusion. Bilateral facet hypertrophy. No significant foraminal or canal stenosis.   IMPRESSION: 1. Interval posterior and interbody fusion at L4-L5 with posterior decompression. No residual canal or right foraminal stenosis. Left foramen is obscured by susceptibility artifact, limiting its assessment. 2. 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 3. Mild bilateral foraminal stenosis at L2-L3, mildly progressed.     Electronically Signed   By: Mabel Converse D.O.   On: 06/27/2024     Objective:  VS:  HT:    WT:   BMI:  BP:130/84  HR:92bpm  TEMP: ( )  RESP:  Physical Exam Vitals and nursing note reviewed.  Constitutional:      General: She is not in acute distress.    Appearance: Normal  appearance. She is not ill-appearing.  HENT:     Head: Normocephalic and atraumatic.     Right Ear: External ear normal.     Left Ear: External ear normal.  Eyes:     Extraocular Movements: Extraocular movements intact.  Cardiovascular:     Rate and Rhythm: Normal rate.     Pulses: Normal pulses.  Pulmonary:     Effort: Pulmonary effort is normal. No respiratory distress.  Abdominal:     General: There is no distension.     Palpations: Abdomen is soft.  Musculoskeletal:        General: Tenderness present.     Cervical back: Neck supple.     Right lower leg: No edema.     Left lower leg: No edema.     Comments: Patient has good distal strength with no pain over the greater trochanters.  No clonus or focal weakness.  Skin:    Findings: No erythema, lesion or rash.  Neurological:     General: No focal deficit present.     Mental Status: She is alert and oriented to person, place, and time.     Sensory: No sensory deficit.     Motor: No weakness or abnormal muscle tone.     Coordination: Coordination normal.  Psychiatric:        Mood and Affect: Mood normal.        Behavior: Behavior normal.      Imaging: No results found. "

## 2024-08-07 ENCOUNTER — Other Ambulatory Visit: Payer: Self-pay

## 2024-08-07 ENCOUNTER — Ambulatory Visit: Admitting: Orthopedic Surgery

## 2024-08-07 VITALS — BP 116/75 | HR 87 | Ht 64.0 in | Wt 215.0 lb

## 2024-08-07 DIAGNOSIS — M5416 Radiculopathy, lumbar region: Secondary | ICD-10-CM | POA: Diagnosis not present

## 2024-08-07 DIAGNOSIS — Z720 Tobacco use: Secondary | ICD-10-CM | POA: Diagnosis not present

## 2024-08-07 NOTE — Progress Notes (Signed)
 Orthopedic Spine Surgery Office Note  Assessment: Patient is a 69 y.o. female with low back pain that radiates into bilateral lateral thighs and legs to the level of the ankle.  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 Explained that from a surgical perspective, smoking increases the risk of infection, wound healing complications, and pseudarthrosis. Patient expressed understanding and has quit before. She did not seem interested in quitting at this time. She listed several treatments that have tried in the past that did not work: nicotine patches (skin reaction), lozenges (did not work), non-nicotine vapes (coughing). She has had success with chantix  but said her copay is $600 now so she cannot afford it.   ___________________________________________________________________________   History:  Patient is a 69 y.o. female who presents today for lumbar spine.  Patient had lumbar spine surgery at L4/5 with Dr. Saul several years ago.  She did well after the surgery and then about 7 months ago started develop low back pain that radiated into her bilateral lower extremities.  She feels it going along the lateral aspect of the thighs and legs to the level of the ankle.  She has tried multiple medications that did not help her with the pain.  She more recently tried a lumbar steroid injection which gave her about 70% relief.  She is able to manage the pain at this level.  She is pleased with how she has done with the injection.  There was no  trauma or injury that preceded the onset of her pain about 7 months ago.  She has a history of stress incontinence.  No recent changes in bowel or bladder habits.  No saddle anesthesia.  Of note, has neuropathy.  Has numbness and paresthesias in bilateral feet to the level of the mid tibia.   Treatments tried: tylenol , gabapentin , methocarbamol , tramadol , lumbar steroid injection  Review of systems: Denies fevers and chills, night sweats, unexplained weight loss, history of cancer. Has had pain that wakes her at night  Past medical history: COPD HLD HTN Fibromyalgia GERD Chronic pain Neuropathy   Allergies: symbicort , celebrex , ciprofloxacin, lisinopril , lyrica, penicillin, wellbutrin, codeine  Past surgical history:  Appendectomy L4/5 TLIF and PSIF Rotator cuff repair Meniscus repair Left biceps tenodesis Hysterectomy  Tonsillectomy  Social history: Reports use of nicotine product (smoking, vaping, patches, smokeless) Alcohol use: denies Denies recreational drug use   Physical Exam:  BMI of 36.7  General: no acute distress, appears stated age Neurologic: alert, answering questions appropriately, following commands Respiratory: unlabored breathing on room air, symmetric chest rise Psychiatric: appropriate affect, normal cadence to speech   MSK (spine):  -Strength exam      Left  Right EHL    5/5  5/5 TA    5/5  5/5 GSC    5/5  5/5 Knee extension  5/5  5/5 Hip flexion   5/5  5/5  -Sensory exam    Sensation intact to light touch in L3-S1 nerve distributions of bilateral lower extremities  -Straight leg raise: negative bilaterally -Clonus: no beats bilaterally  -Left hip  exam: no pain through range of motion -Right hip exam: no pain through range of motion  Imaging: XRs of the lumbar spine from 08/07/2024 were independently reviewed and interpreted, showing posterior spinal instrumentation at L4 and L5.  No lucency seen around the screws.  There is an  interbody device at L4/5.  The cage appears near the posterior aspect of the vertebral bodies.  There appears to be subsidence into the L5 vertebral body.  No fracture or dislocation seen.  No evidence of instability on flexion/distal views.  MRI of the lumbar spine from 06/24/2024 was independently reviewed and interpreted, showing mild central stenosis at L3/4.  Bilateral foraminal stenosis at L3/4.  There is posterior instrumentation at L4/5.  No central or lateral recess stenosis seen at L4/5.  Foraminal stenosis is difficult to evaluate given the artifact from the instrumentation.   Patient name: Frances Nelson Patient MRN: [id number] Date of visit: 08/07/24

## 2024-08-14 ENCOUNTER — Ambulatory Visit (INDEPENDENT_AMBULATORY_CARE_PROVIDER_SITE_OTHER): Admitting: Podiatry

## 2024-08-14 VITALS — BP 127/68 | HR 83 | Temp 97.0°F

## 2024-08-14 DIAGNOSIS — M79605 Pain in left leg: Secondary | ICD-10-CM

## 2024-08-14 DIAGNOSIS — M7989 Other specified soft tissue disorders: Secondary | ICD-10-CM | POA: Diagnosis not present

## 2024-08-14 DIAGNOSIS — L03116 Cellulitis of left lower limb: Secondary | ICD-10-CM

## 2024-08-14 MED ORDER — CLINDAMYCIN HCL 300 MG PO CAPS: 300.0000 mg | ORAL_CAPSULE | Freq: Three times a day (TID) | ORAL | 0 refills | Status: AC

## 2024-08-14 NOTE — Patient Instructions (Signed)
 You can contact [facility] at [phone] if you have any questions about the ultrasound

## 2024-08-15 ENCOUNTER — Ambulatory Visit (HOSPITAL_COMMUNITY)
Admission: RE | Admit: 2024-08-15 | Discharge: 2024-08-15 | Disposition: A | Source: Ambulatory Visit | Attending: Vascular Surgery

## 2024-08-15 DIAGNOSIS — M79605 Pain in left leg: Secondary | ICD-10-CM | POA: Diagnosis present

## 2024-08-16 NOTE — Progress Notes (Signed)
 Subjective:   Patient ID: Frances Nelson, female   DOB: 69 y.o.   MRN: [id number]   HPI Chief Complaint  Patient presents with   Foot Pain    LT foot. Reports redness, purplish color and pain. Woke up night before last and noticed it worsening. The ankle, heel and sides of foot are red and warm to touch. Denies fever, nausea and vomiting. Went to PT aquatherapy today. Swelling and redness have both decreased. No new injury.     69 year old female presents the office today with the above concerns.  She states that yesterday she is noticing swelling and redness on the left lower extremity.  She does not recall any injuries to her falls.  No open lesions that she reports.  She does not endorse any fevers or chills.   Review of Systems  All other systems reviewed and are negative.    Objective:  Physical Exam  General: AAO x3, NAD  Dermatological: There is erythema present to the left foot going to the distal portion of the left leg.  There is no areas of fluctuation or crepitation.  There is increased temperature present.  No open lesions or any areas of drainage.  Vascular: Dorsalis Pedis artery and Posterior Tibial artery pedal pulses are 2/4 bilateral with immedate capillary fill time. There is no pain with calf compression, swelling, warmth, erythema.   Neruologic:  Sensation decreased with some nausea monofilament.  Musculoskeletal: Tenderness on the area of cellulitis of the left lower extremity.      Assessment:   Onychodystrophy, neuropathy, Achilles tendinitis     Plan:  -Treatment options discussed including all alternatives, risks, and complications -Etiology of symptoms were discussed - I do think her presentation is more result of cellulitis.  Prescribed clindamycin  given allergies.  Encouraged compression, elevation.  I did order venous duplex to rule out DVT.  She has a venous reflux scheduled as well. -Monitor for any clinical signs or symptoms of infection and  directed to call the office immediately should any occur or go to the ER.  Return in about 1 week (around 08/21/2024) for cellulits .   Frances Nelson DPM

## 2024-08-19 ENCOUNTER — Ambulatory Visit: Payer: Self-pay | Admitting: Podiatry

## 2024-08-21 ENCOUNTER — Ambulatory Visit: Admitting: Podiatry

## 2024-08-21 DIAGNOSIS — Z7689 Persons encountering health services in other specified circumstances: Secondary | ICD-10-CM

## 2024-08-22 ENCOUNTER — Encounter: Admitting: Physical Medicine & Rehabilitation

## 2024-08-28 ENCOUNTER — Ambulatory Visit (HOSPITAL_COMMUNITY)

## 2024-10-28 ENCOUNTER — Ambulatory Visit: Admitting: Internal Medicine

## 2024-10-28 ENCOUNTER — Ambulatory Visit: Admitting: Podiatry

## 2024-10-29 ENCOUNTER — Ambulatory Visit

## 2024-11-28 ENCOUNTER — Ambulatory Visit
